# Patient Record
Sex: Female | Born: 1980 | Race: Black or African American | Hispanic: No | Marital: Single | Smoking: Former smoker
Health system: Southern US, Community
[De-identification: ages and names within clinical notes are randomized; demographics above are authoritative.]

## PROBLEM LIST (undated history)

## (undated) ENCOUNTER — Emergency Department (HOSPITAL_COMMUNITY): Admission: EM | Payer: Medicaid Other | Source: Home / Self Care

## (undated) DIAGNOSIS — G43909 Migraine, unspecified, not intractable, without status migrainosus: Secondary | ICD-10-CM

## (undated) DIAGNOSIS — J40 Bronchitis, not specified as acute or chronic: Secondary | ICD-10-CM

## (undated) DIAGNOSIS — I10 Essential (primary) hypertension: Secondary | ICD-10-CM

## (undated) DIAGNOSIS — F329 Major depressive disorder, single episode, unspecified: Secondary | ICD-10-CM

## (undated) DIAGNOSIS — N760 Acute vaginitis: Secondary | ICD-10-CM

## (undated) DIAGNOSIS — N8110 Cystocele, unspecified: Secondary | ICD-10-CM

## (undated) DIAGNOSIS — E119 Type 2 diabetes mellitus without complications: Secondary | ICD-10-CM

## (undated) DIAGNOSIS — B379 Candidiasis, unspecified: Secondary | ICD-10-CM

## (undated) DIAGNOSIS — N809 Endometriosis, unspecified: Secondary | ICD-10-CM

## (undated) DIAGNOSIS — B9689 Other specified bacterial agents as the cause of diseases classified elsewhere: Secondary | ICD-10-CM

## (undated) DIAGNOSIS — D259 Leiomyoma of uterus, unspecified: Secondary | ICD-10-CM

## (undated) DIAGNOSIS — N83209 Unspecified ovarian cyst, unspecified side: Secondary | ICD-10-CM

## (undated) DIAGNOSIS — R51 Headache: Secondary | ICD-10-CM

## (undated) DIAGNOSIS — F32A Depression, unspecified: Secondary | ICD-10-CM

## (undated) DIAGNOSIS — F419 Anxiety disorder, unspecified: Secondary | ICD-10-CM

## (undated) DIAGNOSIS — M62838 Other muscle spasm: Secondary | ICD-10-CM

## (undated) DIAGNOSIS — M1990 Unspecified osteoarthritis, unspecified site: Secondary | ICD-10-CM

## (undated) DIAGNOSIS — M419 Scoliosis, unspecified: Secondary | ICD-10-CM

## (undated) HISTORY — DX: Cystocele, unspecified: N81.10

## (undated) HISTORY — PX: CHOLECYSTECTOMY: SHX55

## (undated) HISTORY — DX: Migraine, unspecified, not intractable, without status migrainosus: G43.909

## (undated) HISTORY — PX: TUBAL LIGATION: SHX77

## (undated) HISTORY — DX: Candidiasis, unspecified: B37.9

## (undated) HISTORY — DX: Acute vaginitis: B96.89

## (undated) HISTORY — DX: Other specified bacterial agents as the cause of diseases classified elsewhere: N76.0

---
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
## 2000-02-08 ENCOUNTER — Inpatient Hospital Stay (HOSPITAL_COMMUNITY): Admission: AD | Admit: 2000-02-08 | Discharge: 2000-02-08 | Payer: Self-pay | Admitting: Obstetrics

## 2000-06-07 ENCOUNTER — Inpatient Hospital Stay (HOSPITAL_COMMUNITY): Admission: AD | Admit: 2000-06-07 | Discharge: 2000-06-07 | Payer: Self-pay | Admitting: Obstetrics

## 2001-02-25 ENCOUNTER — Inpatient Hospital Stay (HOSPITAL_COMMUNITY): Admission: AD | Admit: 2001-02-25 | Discharge: 2001-02-25 | Payer: Self-pay | Admitting: Obstetrics

## 2001-11-21 ENCOUNTER — Encounter: Admission: RE | Admit: 2001-11-21 | Discharge: 2002-02-19 | Payer: Self-pay | Admitting: Internal Medicine

## 2002-01-08 ENCOUNTER — Inpatient Hospital Stay (HOSPITAL_COMMUNITY): Admission: AD | Admit: 2002-01-08 | Discharge: 2002-01-08 | Payer: Self-pay | Admitting: Obstetrics and Gynecology

## 2002-02-16 ENCOUNTER — Encounter: Payer: Self-pay | Admitting: Family Medicine

## 2002-02-16 ENCOUNTER — Inpatient Hospital Stay (HOSPITAL_COMMUNITY): Admission: AD | Admit: 2002-02-16 | Discharge: 2002-02-16 | Payer: Self-pay | Admitting: *Deleted

## 2002-02-25 ENCOUNTER — Inpatient Hospital Stay (HOSPITAL_COMMUNITY): Admission: RE | Admit: 2002-02-25 | Discharge: 2002-02-25 | Payer: Self-pay | Admitting: *Deleted

## 2002-06-10 ENCOUNTER — Inpatient Hospital Stay (HOSPITAL_COMMUNITY): Admission: AD | Admit: 2002-06-10 | Discharge: 2002-06-10 | Payer: Self-pay | Admitting: Obstetrics

## 2002-07-20 ENCOUNTER — Inpatient Hospital Stay (HOSPITAL_COMMUNITY): Admission: AD | Admit: 2002-07-20 | Discharge: 2002-07-20 | Payer: Self-pay | Admitting: Obstetrics

## 2002-08-11 ENCOUNTER — Inpatient Hospital Stay (HOSPITAL_COMMUNITY): Admission: AD | Admit: 2002-08-11 | Discharge: 2002-08-11 | Payer: Self-pay | Admitting: Obstetrics

## 2002-10-15 ENCOUNTER — Inpatient Hospital Stay (HOSPITAL_COMMUNITY): Admission: AD | Admit: 2002-10-15 | Discharge: 2002-10-18 | Payer: Self-pay | Admitting: Obstetrics

## 2002-10-15 ENCOUNTER — Ambulatory Visit (HOSPITAL_COMMUNITY): Admission: RE | Admit: 2002-10-15 | Discharge: 2002-10-15 | Payer: Self-pay | Admitting: Obstetrics

## 2002-10-15 ENCOUNTER — Encounter: Payer: Self-pay | Admitting: Obstetrics

## 2003-04-27 ENCOUNTER — Emergency Department (HOSPITAL_COMMUNITY): Admission: EM | Admit: 2003-04-27 | Discharge: 2003-04-27 | Payer: Self-pay | Admitting: *Deleted

## 2003-04-28 ENCOUNTER — Inpatient Hospital Stay (HOSPITAL_COMMUNITY): Admission: AD | Admit: 2003-04-28 | Discharge: 2003-04-28 | Payer: Self-pay | Admitting: Obstetrics

## 2003-09-09 ENCOUNTER — Inpatient Hospital Stay (HOSPITAL_COMMUNITY): Admission: AD | Admit: 2003-09-09 | Discharge: 2003-09-09 | Payer: Self-pay | Admitting: Obstetrics

## 2003-11-11 ENCOUNTER — Inpatient Hospital Stay (HOSPITAL_COMMUNITY): Admission: AD | Admit: 2003-11-11 | Discharge: 2003-11-11 | Payer: Self-pay | Admitting: Obstetrics

## 2003-11-15 ENCOUNTER — Inpatient Hospital Stay (HOSPITAL_COMMUNITY): Admission: AD | Admit: 2003-11-15 | Discharge: 2003-11-15 | Payer: Self-pay | Admitting: Gynecology

## 2003-12-16 ENCOUNTER — Inpatient Hospital Stay (HOSPITAL_COMMUNITY): Admission: AD | Admit: 2003-12-16 | Discharge: 2003-12-16 | Payer: Self-pay | Admitting: Obstetrics

## 2003-12-22 ENCOUNTER — Inpatient Hospital Stay (HOSPITAL_COMMUNITY): Admission: AD | Admit: 2003-12-22 | Discharge: 2003-12-22 | Payer: Self-pay | Admitting: Obstetrics

## 2003-12-27 ENCOUNTER — Inpatient Hospital Stay (HOSPITAL_COMMUNITY): Admission: AD | Admit: 2003-12-27 | Discharge: 2003-12-29 | Payer: Self-pay | Admitting: Obstetrics

## 2003-12-28 ENCOUNTER — Encounter (INDEPENDENT_AMBULATORY_CARE_PROVIDER_SITE_OTHER): Payer: Self-pay | Admitting: *Deleted

## 2004-03-07 ENCOUNTER — Inpatient Hospital Stay (HOSPITAL_COMMUNITY): Admission: AD | Admit: 2004-03-07 | Discharge: 2004-03-07 | Payer: Self-pay | Admitting: Obstetrics

## 2004-03-12 ENCOUNTER — Inpatient Hospital Stay (HOSPITAL_COMMUNITY): Admission: AD | Admit: 2004-03-12 | Discharge: 2004-03-12 | Payer: Self-pay | Admitting: Obstetrics

## 2004-06-24 ENCOUNTER — Inpatient Hospital Stay (HOSPITAL_COMMUNITY): Admission: AD | Admit: 2004-06-24 | Discharge: 2004-06-24 | Payer: Self-pay | Admitting: Obstetrics

## 2004-10-19 ENCOUNTER — Emergency Department (HOSPITAL_COMMUNITY): Admission: EM | Admit: 2004-10-19 | Discharge: 2004-10-19 | Payer: Self-pay | Admitting: Emergency Medicine

## 2004-11-09 ENCOUNTER — Inpatient Hospital Stay (HOSPITAL_COMMUNITY): Admission: AD | Admit: 2004-11-09 | Discharge: 2004-11-09 | Payer: Self-pay | Admitting: Obstetrics & Gynecology

## 2004-11-19 ENCOUNTER — Emergency Department (HOSPITAL_COMMUNITY): Admission: EM | Admit: 2004-11-19 | Discharge: 2004-11-19 | Payer: Self-pay | Admitting: Emergency Medicine

## 2005-01-22 ENCOUNTER — Inpatient Hospital Stay (HOSPITAL_COMMUNITY): Admission: AD | Admit: 2005-01-22 | Discharge: 2005-01-22 | Payer: Self-pay | Admitting: Family Medicine

## 2005-02-03 ENCOUNTER — Emergency Department (HOSPITAL_COMMUNITY): Admission: EM | Admit: 2005-02-03 | Discharge: 2005-02-03 | Payer: Self-pay | Admitting: Emergency Medicine

## 2005-02-15 ENCOUNTER — Emergency Department (HOSPITAL_COMMUNITY): Admission: EM | Admit: 2005-02-15 | Discharge: 2005-02-16 | Payer: Self-pay | Admitting: Emergency Medicine

## 2005-03-13 ENCOUNTER — Inpatient Hospital Stay (HOSPITAL_COMMUNITY): Admission: AD | Admit: 2005-03-13 | Discharge: 2005-03-13 | Payer: Self-pay | Admitting: *Deleted

## 2005-08-30 ENCOUNTER — Inpatient Hospital Stay (HOSPITAL_COMMUNITY): Admission: AD | Admit: 2005-08-30 | Discharge: 2005-08-30 | Payer: Self-pay | Admitting: Obstetrics & Gynecology

## 2005-09-19 ENCOUNTER — Inpatient Hospital Stay (HOSPITAL_COMMUNITY): Admission: AD | Admit: 2005-09-19 | Discharge: 2005-09-19 | Payer: Self-pay | Admitting: Gynecology

## 2006-02-04 ENCOUNTER — Emergency Department (HOSPITAL_COMMUNITY): Admission: EM | Admit: 2006-02-04 | Discharge: 2006-02-04 | Payer: Self-pay | Admitting: Emergency Medicine

## 2006-02-21 ENCOUNTER — Inpatient Hospital Stay (HOSPITAL_COMMUNITY): Admission: AD | Admit: 2006-02-21 | Discharge: 2006-02-21 | Payer: Self-pay | Admitting: Gynecology

## 2006-02-25 ENCOUNTER — Emergency Department (HOSPITAL_COMMUNITY): Admission: EM | Admit: 2006-02-25 | Discharge: 2006-02-25 | Payer: Self-pay | Admitting: Emergency Medicine

## 2006-03-04 ENCOUNTER — Inpatient Hospital Stay (HOSPITAL_COMMUNITY): Admission: AD | Admit: 2006-03-04 | Discharge: 2006-03-04 | Payer: Self-pay | Admitting: Obstetrics & Gynecology

## 2006-04-12 ENCOUNTER — Emergency Department (HOSPITAL_COMMUNITY): Admission: EM | Admit: 2006-04-12 | Discharge: 2006-04-12 | Payer: Self-pay | Admitting: Emergency Medicine

## 2006-06-20 ENCOUNTER — Emergency Department (HOSPITAL_COMMUNITY): Admission: EM | Admit: 2006-06-20 | Discharge: 2006-06-20 | Payer: Self-pay | Admitting: Emergency Medicine

## 2006-06-29 ENCOUNTER — Emergency Department (HOSPITAL_COMMUNITY): Admission: EM | Admit: 2006-06-29 | Discharge: 2006-06-29 | Payer: Self-pay | Admitting: Emergency Medicine

## 2006-08-30 ENCOUNTER — Emergency Department (HOSPITAL_COMMUNITY): Admission: EM | Admit: 2006-08-30 | Discharge: 2006-08-30 | Payer: Self-pay | Admitting: Emergency Medicine

## 2007-03-07 ENCOUNTER — Inpatient Hospital Stay (HOSPITAL_COMMUNITY): Admission: RE | Admit: 2007-03-07 | Discharge: 2007-03-07 | Payer: Self-pay | Admitting: Obstetrics & Gynecology

## 2007-09-19 ENCOUNTER — Emergency Department (HOSPITAL_COMMUNITY): Admission: EM | Admit: 2007-09-19 | Discharge: 2007-09-19 | Payer: Self-pay | Admitting: Emergency Medicine

## 2008-01-31 ENCOUNTER — Emergency Department (HOSPITAL_COMMUNITY): Admission: EM | Admit: 2008-01-31 | Discharge: 2008-01-31 | Payer: Self-pay | Admitting: Emergency Medicine

## 2008-03-24 ENCOUNTER — Inpatient Hospital Stay (HOSPITAL_COMMUNITY): Admission: AD | Admit: 2008-03-24 | Discharge: 2008-03-24 | Payer: Self-pay | Admitting: Obstetrics & Gynecology

## 2008-06-22 ENCOUNTER — Emergency Department (HOSPITAL_COMMUNITY): Admission: EM | Admit: 2008-06-22 | Discharge: 2008-06-22 | Payer: Self-pay | Admitting: Emergency Medicine

## 2008-08-19 ENCOUNTER — Inpatient Hospital Stay (HOSPITAL_COMMUNITY): Admission: AD | Admit: 2008-08-19 | Discharge: 2008-08-19 | Payer: Self-pay | Admitting: Obstetrics & Gynecology

## 2008-10-08 ENCOUNTER — Emergency Department (HOSPITAL_COMMUNITY): Admission: EM | Admit: 2008-10-08 | Discharge: 2008-10-08 | Payer: Self-pay | Admitting: Emergency Medicine

## 2008-11-09 ENCOUNTER — Inpatient Hospital Stay (HOSPITAL_COMMUNITY): Admission: AD | Admit: 2008-11-09 | Discharge: 2008-11-09 | Payer: Self-pay | Admitting: Obstetrics & Gynecology

## 2009-01-27 ENCOUNTER — Emergency Department (HOSPITAL_COMMUNITY): Admission: EM | Admit: 2009-01-27 | Discharge: 2009-01-27 | Payer: Self-pay | Admitting: Emergency Medicine

## 2009-01-29 ENCOUNTER — Emergency Department (HOSPITAL_COMMUNITY): Admission: EM | Admit: 2009-01-29 | Discharge: 2009-01-29 | Payer: Self-pay | Admitting: Emergency Medicine

## 2009-01-30 ENCOUNTER — Inpatient Hospital Stay (HOSPITAL_COMMUNITY): Admission: EM | Admit: 2009-01-30 | Discharge: 2009-02-02 | Payer: Self-pay | Admitting: Emergency Medicine

## 2009-01-30 ENCOUNTER — Ambulatory Visit: Payer: Self-pay | Admitting: Internal Medicine

## 2009-01-30 ENCOUNTER — Encounter: Payer: Self-pay | Admitting: Obstetrics and Gynecology

## 2009-01-31 ENCOUNTER — Encounter (INDEPENDENT_AMBULATORY_CARE_PROVIDER_SITE_OTHER): Payer: Self-pay | Admitting: Surgery

## 2009-02-05 ENCOUNTER — Emergency Department (HOSPITAL_COMMUNITY): Admission: EM | Admit: 2009-02-05 | Discharge: 2009-02-05 | Payer: Self-pay | Admitting: Family Medicine

## 2009-04-20 ENCOUNTER — Emergency Department (HOSPITAL_COMMUNITY): Admission: EM | Admit: 2009-04-20 | Discharge: 2009-04-20 | Payer: Self-pay | Admitting: Family Medicine

## 2009-04-21 ENCOUNTER — Emergency Department (HOSPITAL_COMMUNITY): Admission: EM | Admit: 2009-04-21 | Discharge: 2009-04-21 | Payer: Self-pay | Admitting: Emergency Medicine

## 2009-05-12 ENCOUNTER — Emergency Department (HOSPITAL_COMMUNITY): Admission: EM | Admit: 2009-05-12 | Discharge: 2009-05-12 | Payer: Self-pay | Admitting: Emergency Medicine

## 2009-06-01 ENCOUNTER — Emergency Department (HOSPITAL_COMMUNITY): Admission: EM | Admit: 2009-06-01 | Discharge: 2009-06-01 | Payer: Self-pay | Admitting: Emergency Medicine

## 2009-07-20 ENCOUNTER — Inpatient Hospital Stay (HOSPITAL_COMMUNITY): Admission: AD | Admit: 2009-07-20 | Discharge: 2009-07-20 | Payer: Self-pay | Admitting: Family Medicine

## 2009-08-20 ENCOUNTER — Emergency Department (HOSPITAL_COMMUNITY): Admission: EM | Admit: 2009-08-20 | Discharge: 2009-08-21 | Payer: Self-pay | Admitting: Emergency Medicine

## 2009-08-22 ENCOUNTER — Emergency Department (HOSPITAL_COMMUNITY): Admission: EM | Admit: 2009-08-22 | Discharge: 2009-08-23 | Payer: Self-pay | Admitting: Emergency Medicine

## 2009-09-09 ENCOUNTER — Emergency Department (HOSPITAL_COMMUNITY): Admission: EM | Admit: 2009-09-09 | Discharge: 2009-09-09 | Payer: Self-pay | Admitting: Family Medicine

## 2009-10-02 ENCOUNTER — Emergency Department (HOSPITAL_COMMUNITY): Admission: EM | Admit: 2009-10-02 | Discharge: 2009-10-02 | Payer: Self-pay | Admitting: Emergency Medicine

## 2009-10-05 ENCOUNTER — Ambulatory Visit: Payer: Self-pay | Admitting: Advanced Practice Midwife

## 2009-10-05 ENCOUNTER — Inpatient Hospital Stay (HOSPITAL_COMMUNITY): Admission: AD | Admit: 2009-10-05 | Discharge: 2009-10-05 | Payer: Self-pay | Admitting: Obstetrics & Gynecology

## 2009-10-29 ENCOUNTER — Emergency Department (HOSPITAL_COMMUNITY): Admission: EM | Admit: 2009-10-29 | Discharge: 2009-10-30 | Payer: Self-pay | Admitting: Emergency Medicine

## 2010-01-04 ENCOUNTER — Inpatient Hospital Stay (HOSPITAL_COMMUNITY): Admission: AD | Admit: 2010-01-04 | Discharge: 2010-01-04 | Payer: Self-pay | Admitting: Obstetrics & Gynecology

## 2010-03-04 ENCOUNTER — Inpatient Hospital Stay (HOSPITAL_COMMUNITY): Admission: AD | Admit: 2010-03-04 | Discharge: 2010-03-04 | Payer: Self-pay | Admitting: Obstetrics and Gynecology

## 2010-03-20 ENCOUNTER — Emergency Department (HOSPITAL_COMMUNITY): Admission: EM | Admit: 2010-03-20 | Discharge: 2010-03-20 | Payer: Self-pay | Admitting: Emergency Medicine

## 2010-05-01 ENCOUNTER — Emergency Department (HOSPITAL_COMMUNITY)
Admission: EM | Admit: 2010-05-01 | Discharge: 2010-05-01 | Payer: Self-pay | Source: Home / Self Care | Admitting: Emergency Medicine

## 2010-07-04 LAB — URINALYSIS, ROUTINE W REFLEX MICROSCOPIC
Glucose, UA: NEGATIVE mg/dL
Glucose, UA: NEGATIVE mg/dL
Ketones, ur: NEGATIVE mg/dL
Ketones, ur: NEGATIVE mg/dL
Leukocytes, UA: NEGATIVE
Nitrite: NEGATIVE
Nitrite: NEGATIVE
Protein, ur: NEGATIVE mg/dL
Protein, ur: NEGATIVE mg/dL
Urobilinogen, UA: 0.2 mg/dL (ref 0.0–1.0)
Urobilinogen, UA: 0.2 mg/dL (ref 0.0–1.0)

## 2010-07-04 LAB — POCT I-STAT, CHEM 8
HCT: 41 % (ref 36.0–46.0)
Hemoglobin: 13.9 g/dL (ref 12.0–15.0)
Potassium: 4.1 mEq/L (ref 3.5–5.1)
Sodium: 140 mEq/L (ref 135–145)
TCO2: 25 mmol/L (ref 0–100)

## 2010-07-04 LAB — CBC
HCT: 35.3 % — ABNORMAL LOW (ref 36.0–46.0)
HCT: 37.3 % (ref 36.0–46.0)
Hemoglobin: 11.9 g/dL — ABNORMAL LOW (ref 12.0–15.0)
MCH: 31 pg (ref 26.0–34.0)
MCH: 30.9 pg (ref 26.0–34.0)
MCHC: 33.7 g/dL (ref 30.0–36.0)
MCHC: 34 g/dL (ref 30.0–36.0)
MCV: 92 fL (ref 78.0–100.0)
MCV: 90.9 fL (ref 78.0–100.0)
Platelets: 410 10*3/uL — ABNORMAL HIGH (ref 150–400)
RBC: 3.83 MIL/uL — ABNORMAL LOW (ref 3.87–5.11)
RDW: 13.6 % (ref 11.5–15.5)

## 2010-07-04 LAB — POCT PREGNANCY, URINE: Preg Test, Ur: NEGATIVE

## 2010-07-04 LAB — DIFFERENTIAL
Basophils Absolute: 0.2 10*3/uL — ABNORMAL HIGH (ref 0.0–0.1)
Basophils Relative: 2 % — ABNORMAL HIGH (ref 0–1)
Eosinophils Absolute: 0.2 10*3/uL (ref 0.0–0.7)
Eosinophils Relative: 2 % (ref 0–5)
Neutrophils Relative %: 53 % (ref 43–77)

## 2010-07-04 LAB — URINE MICROSCOPIC-ADD ON

## 2010-07-06 LAB — URINALYSIS, ROUTINE W REFLEX MICROSCOPIC
Bilirubin Urine: NEGATIVE
Ketones, ur: NEGATIVE mg/dL
Nitrite: NEGATIVE
Protein, ur: NEGATIVE mg/dL
pH: 7.5 (ref 5.0–8.0)

## 2010-07-06 LAB — WET PREP, GENITAL

## 2010-07-06 LAB — HERPES SIMPLEX VIRUS CULTURE: Culture: DETECTED

## 2010-07-06 LAB — GC/CHLAMYDIA PROBE AMP, GENITAL: Chlamydia, DNA Probe: NEGATIVE

## 2010-07-09 LAB — URINALYSIS, ROUTINE W REFLEX MICROSCOPIC
Bilirubin Urine: NEGATIVE
Bilirubin Urine: NEGATIVE
Bilirubin Urine: NEGATIVE
Hgb urine dipstick: NEGATIVE
Ketones, ur: NEGATIVE mg/dL
Nitrite: NEGATIVE
Nitrite: NEGATIVE
Protein, ur: NEGATIVE mg/dL
Specific Gravity, Urine: 1.016 (ref 1.005–1.030)
Specific Gravity, Urine: 1.015 (ref 1.005–1.030)
Urobilinogen, UA: 0.2 mg/dL (ref 0.0–1.0)
Urobilinogen, UA: 0.2 mg/dL (ref 0.0–1.0)
Urobilinogen, UA: 0.2 mg/dL (ref 0.0–1.0)
pH: 8.5 — ABNORMAL HIGH (ref 5.0–8.0)

## 2010-07-09 LAB — POCT I-STAT, CHEM 8
Calcium, Ion: 1.17 mmol/L (ref 1.12–1.32)
Chloride: 106 mEq/L (ref 96–112)
Creatinine, Ser: 0.8 mg/dL (ref 0.4–1.2)
Glucose, Bld: 87 mg/dL (ref 70–99)
Glucose, Bld: 100 mg/dL — ABNORMAL HIGH (ref 70–99)
HCT: 38 % (ref 36.0–46.0)
HCT: 39 % (ref 36.0–46.0)
Hemoglobin: 12.9 g/dL (ref 12.0–15.0)
Potassium: 3.4 mEq/L — ABNORMAL LOW (ref 3.5–5.1)

## 2010-07-09 LAB — WET PREP, GENITAL

## 2010-07-09 LAB — GC/CHLAMYDIA PROBE AMP, GENITAL: GC Probe Amp, Genital: NEGATIVE

## 2010-07-09 LAB — URINE MICROSCOPIC-ADD ON

## 2010-07-09 LAB — CBC
Hemoglobin: 13.1 g/dL (ref 12.0–15.0)
MCHC: 33.6 g/dL (ref 30.0–36.0)
RBC: 4.21 MIL/uL (ref 3.87–5.11)
WBC: 9.3 10*3/uL (ref 4.0–10.5)

## 2010-07-09 LAB — POCT PREGNANCY, URINE: Preg Test, Ur: NEGATIVE

## 2010-07-09 LAB — RPR: RPR Ser Ql: NONREACTIVE

## 2010-07-10 LAB — POCT URINALYSIS DIP (DEVICE)
Glucose, UA: NEGATIVE mg/dL
Hgb urine dipstick: NEGATIVE
Specific Gravity, Urine: 1.02 (ref 1.005–1.030)
Urobilinogen, UA: 1 mg/dL (ref 0.0–1.0)
pH: 7 (ref 5.0–8.0)

## 2010-07-10 LAB — URINALYSIS, ROUTINE W REFLEX MICROSCOPIC
Glucose, UA: NEGATIVE mg/dL
Hgb urine dipstick: NEGATIVE
Specific Gravity, Urine: 1.026 (ref 1.005–1.030)
pH: 6.5 (ref 5.0–8.0)

## 2010-07-12 LAB — URINALYSIS, ROUTINE W REFLEX MICROSCOPIC
Glucose, UA: NEGATIVE mg/dL
Hgb urine dipstick: NEGATIVE
Ketones, ur: NEGATIVE mg/dL
pH: 6.5 (ref 5.0–8.0)

## 2010-07-12 LAB — POCT PREGNANCY, URINE: Preg Test, Ur: NEGATIVE

## 2010-07-14 LAB — URINE MICROSCOPIC-ADD ON

## 2010-07-14 LAB — GC/CHLAMYDIA PROBE AMP, GENITAL
Chlamydia, DNA Probe: NEGATIVE
GC Probe Amp, Genital: POSITIVE — AB

## 2010-07-14 LAB — URINALYSIS, ROUTINE W REFLEX MICROSCOPIC
Nitrite: NEGATIVE
Specific Gravity, Urine: >1.03 — ABNORMAL HIGH (ref 1.005–1.030)
Urobilinogen, UA: 0.2 mg/dL (ref 0.0–1.0)
pH: 6 (ref 5.0–8.0)

## 2010-07-25 ENCOUNTER — Emergency Department (HOSPITAL_COMMUNITY)
Admission: EM | Admit: 2010-07-25 | Discharge: 2010-07-25 | Disposition: A | Discharge status: Home / Self Care | Payer: Self-pay | Attending: Emergency Medicine | Admitting: Emergency Medicine

## 2010-07-25 DIAGNOSIS — J45909 Unspecified asthma, uncomplicated: Secondary | ICD-10-CM | POA: Insufficient documentation

## 2010-07-25 DIAGNOSIS — R51 Headache: Secondary | ICD-10-CM | POA: Insufficient documentation

## 2010-07-25 DIAGNOSIS — R0602 Shortness of breath: Secondary | ICD-10-CM | POA: Insufficient documentation

## 2010-07-27 LAB — CBC
HCT: 38.6 % (ref 36.0–46.0)
HCT: 35.7 % — ABNORMAL LOW (ref 36.0–46.0)
Hemoglobin: 12.7 g/dL (ref 12.0–15.0)
Hemoglobin: 11.8 g/dL — ABNORMAL LOW (ref 12.0–15.0)
MCHC: 32.8 g/dL (ref 30.0–36.0)
RBC: 3.82 MIL/uL — ABNORMAL LOW (ref 3.87–5.11)
RBC: 3.99 MIL/uL (ref 3.87–5.11)
RDW: 13 % (ref 11.5–15.5)
RDW: 12.8 % (ref 11.5–15.5)
WBC: 15.5 10*3/uL — ABNORMAL HIGH (ref 4.0–10.5)

## 2010-07-27 LAB — COMPREHENSIVE METABOLIC PANEL
ALT: 320 U/L — ABNORMAL HIGH (ref 0–35)
ALT: 380 U/L — ABNORMAL HIGH (ref 0–35)
AST: 100 U/L — ABNORMAL HIGH (ref 0–37)
Albumin: 3.1 g/dL — ABNORMAL LOW (ref 3.5–5.2)
Alkaline Phosphatase: 108 U/L (ref 39–117)
Alkaline Phosphatase: 76 U/L (ref 39–117)
BUN: 4 mg/dL — ABNORMAL LOW (ref 6–23)
BUN: 3 mg/dL — ABNORMAL LOW (ref 6–23)
CO2: 24 mEq/L (ref 19–32)
CO2: 26 mEq/L (ref 19–32)
Chloride: 110 mEq/L (ref 96–112)
GFR calc Af Amer: >60 mL/min (ref >60)
GFR calc non Af Amer: >60 mL/min (ref >60)
GFR calc non Af Amer: >60 mL/min (ref >60)
Glucose, Bld: 96 mg/dL (ref 70–99)
Glucose, Bld: 97 mg/dL (ref 70–99)
Potassium: 3.5 mEq/L (ref 3.5–5.1)
Potassium: 3.6 mEq/L (ref 3.5–5.1)
Sodium: 139 mEq/L (ref 135–145)
Sodium: 140 mEq/L (ref 135–145)
Total Bilirubin: 0.6 mg/dL (ref 0.3–1.2)
Total Bilirubin: 0.8 mg/dL (ref 0.3–1.2)
Total Protein: 7.4 g/dL (ref 6.0–8.3)
Total Protein: 6.3 g/dL (ref 6.0–8.3)

## 2010-07-27 LAB — WET PREP, GENITAL

## 2010-07-27 LAB — DIFFERENTIAL
Basophils Absolute: 0.1 10*3/uL (ref 0.0–0.1)
Eosinophils Absolute: 0 10*3/uL (ref 0.0–0.7)
Eosinophils Relative: 0 % (ref 0–5)
Lymphs Abs: 2.5 10*3/uL (ref 0.7–4.0)
Monocytes Absolute: 0.7 10*3/uL (ref 0.1–1.0)

## 2010-07-27 LAB — URINALYSIS, ROUTINE W REFLEX MICROSCOPIC
Bilirubin Urine: NEGATIVE
Ketones, ur: NEGATIVE mg/dL
Nitrite: NEGATIVE
Nitrite: NEGATIVE
Protein, ur: NEGATIVE mg/dL
Specific Gravity, Urine: 1.02 (ref 1.005–1.030)
Specific Gravity, Urine: 1.023 (ref 1.005–1.030)
Urobilinogen, UA: 4 mg/dL — ABNORMAL HIGH (ref 0.0–1.0)
Urobilinogen, UA: 0.2 mg/dL (ref 0.0–1.0)

## 2010-07-27 LAB — AMYLASE: Amylase: 82 U/L (ref 27–131)

## 2010-07-27 LAB — GC/CHLAMYDIA PROBE AMP, GENITAL
Chlamydia, DNA Probe: NEGATIVE
GC Probe Amp, Genital: NEGATIVE

## 2010-07-27 LAB — POCT PREGNANCY, URINE
Preg Test, Ur: NEGATIVE
Preg Test, Ur: NEGATIVE

## 2010-07-27 LAB — D-DIMER, QUANTITATIVE: D-Dimer, Quant: 0.37 ug/mL-FEU (ref 0.00–0.48)

## 2010-07-27 LAB — LIPASE, BLOOD
Lipase: 21 U/L (ref 11–59)
Lipase: 22 U/L (ref 11–59)

## 2010-07-30 LAB — URINALYSIS, ROUTINE W REFLEX MICROSCOPIC
Ketones, ur: NEGATIVE mg/dL
Leukocytes, UA: NEGATIVE
Nitrite: NEGATIVE
Protein, ur: NEGATIVE mg/dL
Urobilinogen, UA: 0.2 mg/dL (ref 0.0–1.0)

## 2010-07-30 LAB — WET PREP, GENITAL

## 2010-07-30 LAB — URINE MICROSCOPIC-ADD ON

## 2010-07-30 LAB — GC/CHLAMYDIA PROBE AMP, GENITAL: GC Probe Amp, Genital: NEGATIVE

## 2010-07-30 LAB — POCT PREGNANCY, URINE: Preg Test, Ur: NEGATIVE

## 2010-08-02 LAB — URINALYSIS, ROUTINE W REFLEX MICROSCOPIC
Glucose, UA: NEGATIVE mg/dL
Ketones, ur: NEGATIVE mg/dL
Protein, ur: NEGATIVE mg/dL
Urobilinogen, UA: 0.2 mg/dL (ref 0.0–1.0)

## 2010-08-02 LAB — WET PREP, GENITAL: Trich, Wet Prep: NONE SEEN

## 2010-08-02 LAB — GC/CHLAMYDIA PROBE AMP, GENITAL: GC Probe Amp, Genital: NEGATIVE

## 2010-09-08 NOTE — Op Note (Signed)
NAME:  Dana Hughes, Dana Hughes                        ACCOUNT NO.:  [id number]   MEDICAL RECORD NO.:  [id number]                   PATIENT TYPE:  INP   LOCATION:  9127                                 FACILITY:  [facility]   PHYSICIAN:  Kathreen Cosier, M.D.           DATE OF BIRTH:  12/31/1980   DATE OF PROCEDURE:  12/28/2003  DATE OF DISCHARGE:                                 OPERATIVE REPORT   PREOPERATIVE DIAGNOSIS:  Multiparity.   POSTOPERATIVE DIAGNOSIS:  Multiparity.   PROCEDURE:  Postpartum tubal ligation.   DESCRIPTION OF PROCEDURE:  Under general anesthesia with the patient in the  supine position, the abdomen prepped and draped, the bladder emptied with a  straight catheter.  A midline subumbilical incision 1 inch long was made,  carried down to the fascia.  The fascia was cleaned, grasped with two  Kocher's and the fascia and the peritoneum were opened with the Mayo  scissors.  The left tube was grasped in the mid portion with the Babcock  clamp and 0 plain suture placed in the mesosalpinx below the portion of the  tube was then clamped.  This was tied and approximately 1 inch of tube  transected.  The tube had been traced to the fimbria.  The procedure was  done in the exact fashion on the other side.  The lap and sponge counts were  correct.  The abdomen closed in layers.  The peritoneum and the fascia with  continuously closed with 2-0 Dexon.  The skin was closed with subcuticular  stitch of 4-0 Monocryl.  Blood loss less than 5 cc.  The patient tolerated  the procedure well, returned to the recovery room in good condition.                                               Kathreen Cosier, M.D.    BAM/MEDQ  D:  12/28/2003  T:  12/28/2003  Job:  [id number]

## 2010-09-08 NOTE — Discharge Summary (Signed)
NAME:  Dana Hughes, Dana Hughes                        ACCOUNT NO.:  [id number]   MEDICAL RECORD NO.:  [id number]                   PATIENT TYPE:  INP   LOCATION:  9127                                 FACILITY:   PHYSICIAN:  Kathreen Cosier, M.D.           DATE OF BIRTH:  15-May-1980   DATE OF ADMISSION:  12/27/2003  DATE OF DISCHARGE:  12/29/2003                                 DISCHARGE SUMMARY   This is a 30 year old, gravida 3, para 1-1-0-2 with an EDC of 12/31/2003 who  was admitted 9 cm dilated, vertex -1 in labor and with ruptured membranes.  She progressed rapidly and had a normal vaginal delivery of a female,  Apgar's of 9 and 9.  The patient desired sterilization and on 12/28/2003  underwent postpartum bilateral tubal ligation.  By 12/29/2003, postpartum  day #2, her hemoglobin was 11.8.  She had no complaints and she was  discharged home ambulatory, on a regular diet, on Darvocet one p.o. q.4h.  p.r.n.   DISCHARGE DIAGNOSIS:  Normal vaginal delivery at term and postpartum  bilateral tubal ligation.                                               Kathreen Cosier, M.D.    BAM/MEDQ  D:  12/29/2003  T:  12/29/2003  Job:  [id number]

## 2011-01-17 LAB — POCT I-STAT, CHEM 8
HCT: 40
Hemoglobin: 13.6
Potassium: 3.8
Sodium: 142
TCO2: 25

## 2011-01-17 LAB — PREGNANCY, URINE: Preg Test, Ur: NEGATIVE

## 2011-01-17 LAB — URINALYSIS, ROUTINE W REFLEX MICROSCOPIC
Ketones, ur: NEGATIVE
Nitrite: NEGATIVE
Protein, ur: NEGATIVE
pH: 7.5

## 2011-01-17 LAB — WET PREP, GENITAL

## 2011-01-17 LAB — URINE MICROSCOPIC-ADD ON

## 2011-01-17 LAB — GC/CHLAMYDIA PROBE AMP, GENITAL: Chlamydia, DNA Probe: NEGATIVE

## 2011-01-18 ENCOUNTER — Inpatient Hospital Stay (HOSPITAL_COMMUNITY)
Admission: AD | Admit: 2011-01-18 | Discharge: 2011-01-18 | Disposition: A | Discharge status: Home / Self Care | Payer: Medicaid Other | Source: Ambulatory Visit | Attending: Obstetrics and Gynecology | Admitting: Obstetrics and Gynecology

## 2011-01-18 ENCOUNTER — Encounter (HOSPITAL_COMMUNITY): Payer: Self-pay

## 2011-01-18 DIAGNOSIS — B373 Candidiasis of vulva and vagina: Secondary | ICD-10-CM | POA: Insufficient documentation

## 2011-01-18 DIAGNOSIS — N760 Acute vaginitis: Secondary | ICD-10-CM | POA: Insufficient documentation

## 2011-01-18 DIAGNOSIS — A499 Bacterial infection, unspecified: Secondary | ICD-10-CM

## 2011-01-18 DIAGNOSIS — B3731 Acute candidiasis of vulva and vagina: Secondary | ICD-10-CM | POA: Insufficient documentation

## 2011-01-18 DIAGNOSIS — B9689 Other specified bacterial agents as the cause of diseases classified elsewhere: Secondary | ICD-10-CM | POA: Insufficient documentation

## 2011-01-18 HISTORY — DX: Depression, unspecified: F32.A

## 2011-01-18 HISTORY — DX: Essential (primary) hypertension: I10

## 2011-01-18 HISTORY — DX: Anxiety disorder, unspecified: F41.9

## 2011-01-18 HISTORY — DX: Major depressive disorder, single episode, unspecified: F32.9

## 2011-01-18 LAB — URINALYSIS, ROUTINE W REFLEX MICROSCOPIC
Leukocytes, UA: NEGATIVE
Nitrite: NEGATIVE
Protein, ur: NEGATIVE mg/dL
Urobilinogen, UA: 0.2 mg/dL (ref 0.0–1.0)

## 2011-01-18 LAB — COMPREHENSIVE METABOLIC PANEL
ALT: 14 U/L (ref 0–35)
AST: 13 U/L (ref 0–37)
Alkaline Phosphatase: 44 U/L (ref 39–117)
CO2: 28 mEq/L (ref 19–32)
Calcium: 9.3 mg/dL (ref 8.4–10.5)
Chloride: 106 mEq/L (ref 96–112)
GFR calc Af Amer: >60 mL/min (ref >60)
GFR calc non Af Amer: >60 mL/min (ref >60)
Glucose, Bld: 80 mg/dL (ref 70–99)
Potassium: 4.2 mEq/L (ref 3.5–5.1)
Sodium: 139 mEq/L (ref 135–145)

## 2011-01-18 LAB — CBC
Hemoglobin: 11.7 g/dL — ABNORMAL LOW (ref 12.0–15.0)
MCV: 91.2 fL (ref 78.0–100.0)
Platelets: 345 10*3/uL (ref 150–400)
RBC: 3.87 MIL/uL (ref 3.87–5.11)
WBC: 10.5 10*3/uL (ref 4.0–10.5)

## 2011-01-18 LAB — WET PREP, GENITAL: Trich, Wet Prep: NONE SEEN

## 2011-01-18 LAB — DIFFERENTIAL
Basophils Absolute: 0 10*3/uL (ref 0.0–0.1)
Lymphocytes Relative: 35 % (ref 12–46)
Lymphs Abs: 3.6 10*3/uL (ref 0.7–4.0)
Neutro Abs: 5.8 10*3/uL (ref 1.7–7.7)
Neutrophils Relative %: 56 % (ref 43–77)

## 2011-01-18 LAB — POCT PREGNANCY, URINE: Preg Test, Ur: NEGATIVE

## 2011-01-18 MED ORDER — METRONIDAZOLE 500 MG PO TABS: 500.0000 mg | ORAL_TABLET | Freq: Two times a day (BID) | ORAL | Status: AC

## 2011-01-18 MED ORDER — KETOROLAC TROMETHAMINE 60 MG/2ML IM SOLN
60.0000 mg | Freq: Once | INTRAMUSCULAR | Status: AC
  Administered 2011-01-18: 60 mg via INTRAMUSCULAR
  Filled 2011-01-18: qty 2

## 2011-01-18 MED ORDER — FLUCONAZOLE 150 MG PO TABS
150.0000 mg | ORAL_TABLET | Freq: Once | ORAL | Status: AC
  Administered 2011-01-18: 150 mg via ORAL
  Filled 2011-01-18: qty 1

## 2011-01-18 NOTE — Progress Notes (Signed)
Pt states she has been having abdominal pain off and on since her last child was born 7 years ago. Started getting worse on 9-25. States she has had a headache for 2 months.

## 2011-01-18 NOTE — ED Provider Notes (Signed)
Agree with above note.  Dana Hughes 01/18/2011 2:07 PM

## 2011-01-18 NOTE — ED Provider Notes (Signed)
History     CSN: [id number] Arrival date & time: 01/18/2011 10:28 AM  Chief Complaint  Patient presents with  . Back Pain    (Consider location/radiation/quality/duration/timing/severity/associated sxs/prior treatment) HPI Dana Hughes is a 30 y.o. female who presents to [facility]U for vaginal discharge that started 3 days ago. She had unprotected sex 5 days ago and the discharge started after that . She states she thinks her boyfriend is cheating on her. Has had some pain in lower abdomen on the right side and radiates to the back. Vaginal itching and burning. Also c/o "migrane headache" that started 2 months ago and is off and on. Has taken Excedrin, ibuprofen, tylenol etc. Without relief.  Past Medical History  Diagnosis Date  . Hypertension     no longer takes meds  . Asthma   . Anxiety   . Depression     Past Surgical History  Procedure Date  . Tubal ligation   . Cholecystectomy     No family history on file.  History  Substance Use Topics  . Smoking status: Never Smoker   . Smokeless tobacco: Not on file  . Alcohol Use: Yes    OB History    Grav Para Term Preterm Abortions TAB SAB Ect Mult Living   4 3 3  0 1 0 1 0 0 3      Review of Systems  Constitutional: Positive for appetite change. Negative for fever, chills, diaphoresis and fatigue.  HENT: Positive for congestion, sore throat, sneezing and sinus pressure. Negative for ear pain, facial swelling, neck pain, neck stiffness and dental problem.   Eyes: Positive for photophobia. Negative for pain and discharge.  Respiratory: Positive for wheezing. Negative for cough and chest tightness.   Cardiovascular: Negative.   Gastrointestinal: Positive for nausea, abdominal pain and diarrhea. Negative for vomiting, constipation and abdominal distention.  Genitourinary: Positive for frequency. Negative for dysuria, flank pain and difficulty urinating.  Musculoskeletal: Positive for back pain. Negative for myalgias and gait  problem.  Skin: Negative for color change and rash.  Neurological: Positive for light-headedness and headaches. Negative for dizziness, speech difficulty, weakness and numbness.  Psychiatric/Behavioral: Negative for confusion.    Allergies  Penicillins and Percocet  Home Medications  No current outpatient prescriptions on file.  BP 115/78  Pulse 66  Temp(Src) 98.1 F (36.7 C) (Oral)  Resp 16  Ht 5' 3.5" (1.613 m)  Wt 205 lb (92.987 kg)  BMI 35.74 kg/m2  LMP 12/21/2010  Physical Exam  Nursing note and vitals reviewed. Constitutional: She is oriented to person, place, and time. She appears well-developed and well-nourished.  HENT:  Head: Normocephalic.  Eyes: EOM are normal.  Neck: Neck supple.  Pulmonary/Chest: Effort normal.  Abdominal: Soft. There is no tenderness.       Unable to reproduce the pain the patient states she has off and on. No inguinal or abdominal hernia palpated.  Genitourinary:       No CVA tenderness. Vaginal irritation, yellow, frothy, malodorous discharge. No CMT or adnexal tenderness. Uterus without palpable enlargement.  Musculoskeletal: Normal range of motion.  Neurological: She is alert and oriented to person, place, and time.  Skin: Skin is warm and dry.   Results for orders placed during the hospital encounter of 01/18/11 (from the past 24 hour(s))  URINALYSIS, ROUTINE W REFLEX MICROSCOPIC     Status: Normal   Collection Time   01/18/11 10:57 AM      Component Value Range   Color, Urine YELLOW  YELLOW    Appearance CLEAR  CLEAR    Specific Gravity, Urine 1.010  1.005 - 1.030    pH 8.0  5.0 - 8.0    Glucose, UA NEGATIVE  NEGATIVE (mg/dL)   Hgb urine dipstick NEGATIVE  NEGATIVE    Bilirubin Urine NEGATIVE  NEGATIVE    Ketones, ur NEGATIVE  NEGATIVE (mg/dL)   Protein, ur NEGATIVE  NEGATIVE (mg/dL)   Urobilinogen, UA 0.2  0.0 - 1.0 (mg/dL)   Nitrite NEGATIVE  NEGATIVE    Leukocytes, UA NEGATIVE  NEGATIVE   POCT PREGNANCY, URINE     Status:  Normal   Collection Time   01/18/11 11:03 AM      Component Value Range   Preg Test, Ur NEGATIVE    CBC     Status: Abnormal   Collection Time   01/18/11 11:56 AM      Component Value Range   WBC 10.5  4.0 - 10.5 (K/uL)   RBC 3.87  3.87 - 5.11 (MIL/uL)   Hemoglobin 11.7 (*) 12.0 - 15.0 (g/dL)   HCT 40.9 (*) 81.1 - 46.0 (%)   MCV 91.2  78.0 - 100.0 (fL)   MCH 30.2  26.0 - 34.0 (pg)   MCHC 33.1  30.0 - 36.0 (g/dL)   RDW 91.4  78.2 - 95.6 (%)   Platelets 345  150 - 400 (K/uL)  DIFFERENTIAL     Status: Normal   Collection Time   01/18/11 11:56 AM      Component Value Range   Neutrophils Relative 56  43 - 77 (%)   Neutro Abs 5.8  1.7 - 7.7 (K/uL)   Lymphocytes Relative 35  12 - 46 (%)   Lymphs Abs 3.6  0.7 - 4.0 (K/uL)   Monocytes Relative 8  3 - 12 (%)   Monocytes Absolute 0.8  0.1 - 1.0 (K/uL)   Eosinophils Relative 2  0 - 5 (%)   Eosinophils Absolute 0.2  0.0 - 0.7 (K/uL)   Basophils Relative 0  0 - 1 (%)   Basophils Absolute 0.0  0.0 - 0.1 (K/uL)  COMPREHENSIVE METABOLIC PANEL     Status: Abnormal   Collection Time   01/18/11 11:56 AM      Component Value Range   Sodium 139  135 - 145 (mEq/L)   Potassium 4.2  3.5 - 5.1 (mEq/L)   Chloride 106  96 - 112 (mEq/L)   CO2 28  19 - 32 (mEq/L)   Glucose, Bld 80  70 - 99 (mg/dL)   BUN 13  6 - 23 (mg/dL)   Creatinine, Ser 2.13  0.50 - 1.10 (mg/dL)   Calcium 9.3  8.4 - 08.6 (mg/dL)   Total Protein 6.8  6.0 - 8.3 (g/dL)   Albumin 3.6  3.5 - 5.2 (g/dL)   AST 13  0 - 37 (U/L)   ALT 14  0 - 35 (U/L)   Alkaline Phosphatase 44  39 - 117 (U/L)   Total Bilirubin 0.2 (*) 0.3 - 1.2 (mg/dL)   GFR calc non Af Amer >60  >60 (mL/min)   GFR calc Af Amer >60  >60 (mL/min)  WET PREP, GENITAL     Status: Abnormal   Collection Time   01/18/11 12:34 PM      Component Value Range   Yeast, Wet Prep MODERATE (*) NONE SEEN    Trich, Wet Prep NONE SEEN  NONE SEEN    Clue Cells, Wet Prep MANY (*) NONE SEEN  WBC, Wet Prep HPF POC FEW (*) NONE SEEN       ED Course  Procedures   MDM   Assessment: Bacterial vaginosis   Monilia vaginitis  Plan:  Flagyl 500 mg po bid x 7 days   Diflucan 150 mg. Po x 1   Follow up with PCP        Kerrie Buffalo, NP 01/18/11 1310

## 2011-01-18 NOTE — Progress Notes (Signed)
Pt states feels like vagina is swollen and has been painful since last time having intercourse without a condom on Sunday.

## 2011-01-23 ENCOUNTER — Emergency Department (HOSPITAL_COMMUNITY)
Admission: EM | Admit: 2011-01-23 | Discharge: 2011-01-23 | Disposition: A | Discharge status: Home / Self Care | Payer: Medicaid Other | Attending: Emergency Medicine | Admitting: Emergency Medicine

## 2011-01-23 DIAGNOSIS — G8929 Other chronic pain: Secondary | ICD-10-CM | POA: Insufficient documentation

## 2011-01-23 DIAGNOSIS — M549 Dorsalgia, unspecified: Secondary | ICD-10-CM | POA: Insufficient documentation

## 2011-01-25 LAB — DIFFERENTIAL
Basophils Absolute: 0 10*3/uL (ref 0.0–0.1)
Basophils Relative: 0 % (ref 0–1)
Lymphocytes Relative: 29 % (ref 12–46)
Neutro Abs: 5 10*3/uL (ref 1.7–7.7)
Neutrophils Relative %: 60 % (ref 43–77)

## 2011-01-25 LAB — URINALYSIS, ROUTINE W REFLEX MICROSCOPIC
Bilirubin Urine: NEGATIVE
Glucose, UA: NEGATIVE mg/dL
Hgb urine dipstick: NEGATIVE
Protein, ur: NEGATIVE mg/dL
Urobilinogen, UA: 0.2 mg/dL (ref 0.0–1.0)

## 2011-01-25 LAB — WET PREP, GENITAL
Trich, Wet Prep: NONE SEEN
Yeast Wet Prep HPF POC: NONE SEEN

## 2011-01-25 LAB — GC/CHLAMYDIA PROBE AMP, GENITAL
Chlamydia, DNA Probe: NEGATIVE
GC Probe Amp, Genital: NEGATIVE

## 2011-01-25 LAB — CBC
MCHC: 33.3 g/dL (ref 30.0–36.0)
RBC: 3.97 MIL/uL (ref 3.87–5.11)
RDW: 13.4 % (ref 11.5–15.5)

## 2011-01-30 LAB — URINALYSIS, ROUTINE W REFLEX MICROSCOPIC
Nitrite: NEGATIVE
Specific Gravity, Urine: 1.02
Urobilinogen, UA: 0.2
pH: 8

## 2011-02-13 ENCOUNTER — Inpatient Hospital Stay (HOSPITAL_COMMUNITY)
Admission: AD | Admit: 2011-02-13 | Discharge: 2011-02-13 | Disposition: A | Discharge status: Home / Self Care | Payer: Medicaid Other | Source: Ambulatory Visit | Attending: Obstetrics & Gynecology | Admitting: Obstetrics & Gynecology

## 2011-02-13 DIAGNOSIS — S90569A Insect bite (nonvenomous), unspecified ankle, initial encounter: Secondary | ICD-10-CM | POA: Insufficient documentation

## 2011-02-13 DIAGNOSIS — W57XXXA Bitten or stung by nonvenomous insect and other nonvenomous arthropods, initial encounter: Secondary | ICD-10-CM | POA: Insufficient documentation

## 2011-02-13 DIAGNOSIS — R300 Dysuria: Secondary | ICD-10-CM | POA: Insufficient documentation

## 2011-02-13 DIAGNOSIS — R109 Unspecified abdominal pain: Secondary | ICD-10-CM | POA: Insufficient documentation

## 2011-02-13 LAB — URINALYSIS, ROUTINE W REFLEX MICROSCOPIC
Hgb urine dipstick: NEGATIVE
Leukocytes, UA: NEGATIVE
Nitrite: NEGATIVE
Protein, ur: NEGATIVE mg/dL
Urobilinogen, UA: 0.2 mg/dL (ref 0.0–1.0)

## 2011-02-13 LAB — POCT PREGNANCY, URINE: Preg Test, Ur: NEGATIVE

## 2011-02-13 NOTE — Progress Notes (Signed)
Called to pt room. Pt states she needs to leave her ride is being discharged from another room in MAU. AMA paper signed, informed pt the risks and benefits of staying. Instructed pt  that she can come back to MAU when time allows. Pt verbalized understanding.

## 2011-02-13 NOTE — Progress Notes (Signed)
Patient states that she has had multiple insect bites on her right leg from the thigh down to the calf over the past few days Start as an itchy bump and progress to sores. Also has been treating with over the counter medication for a yeast infection. Has lower abdominal pain and pain with urination.

## 2011-05-17 ENCOUNTER — Inpatient Hospital Stay (HOSPITAL_COMMUNITY)
Admission: AD | Admit: 2011-05-17 | Discharge: 2011-05-17 | Disposition: A | Discharge status: Home / Self Care | Payer: Medicaid Other | Source: Ambulatory Visit | Attending: Obstetrics & Gynecology | Admitting: Obstetrics & Gynecology

## 2011-05-17 ENCOUNTER — Encounter (HOSPITAL_COMMUNITY): Payer: Self-pay | Admitting: *Deleted

## 2011-05-17 ENCOUNTER — Inpatient Hospital Stay (HOSPITAL_COMMUNITY): Payer: Medicaid Other

## 2011-05-17 DIAGNOSIS — M545 Low back pain, unspecified: Secondary | ICD-10-CM | POA: Insufficient documentation

## 2011-05-17 DIAGNOSIS — N946 Dysmenorrhea, unspecified: Secondary | ICD-10-CM | POA: Insufficient documentation

## 2011-05-17 DIAGNOSIS — R109 Unspecified abdominal pain: Secondary | ICD-10-CM | POA: Insufficient documentation

## 2011-05-17 HISTORY — DX: Headache: R51

## 2011-05-17 LAB — CBC
HCT: 35.2 % — ABNORMAL LOW (ref 36.0–46.0)
Platelets: 320 10*3/uL (ref 150–400)
RDW: 13.2 % (ref 11.5–15.5)
WBC: 10.3 10*3/uL (ref 4.0–10.5)

## 2011-05-17 LAB — DIFFERENTIAL
Basophils Absolute: 0 10*3/uL (ref 0.0–0.1)
Lymphocytes Relative: 29 % (ref 12–46)
Monocytes Absolute: 0.6 10*3/uL (ref 0.1–1.0)
Neutro Abs: 6.5 10*3/uL (ref 1.7–7.7)

## 2011-05-17 LAB — WET PREP, GENITAL: Yeast Wet Prep HPF POC: NONE SEEN

## 2011-05-17 LAB — URINALYSIS, ROUTINE W REFLEX MICROSCOPIC
Bilirubin Urine: NEGATIVE
Ketones, ur: NEGATIVE mg/dL
Nitrite: NEGATIVE
Protein, ur: NEGATIVE mg/dL
pH: 7 (ref 5.0–8.0)

## 2011-05-17 LAB — ABO/RH: ABO/RH(D): A POS

## 2011-05-17 MED ORDER — KETOROLAC TROMETHAMINE 60 MG/2ML IM SOLN
60.0000 mg | Freq: Once | INTRAMUSCULAR | Status: AC
  Administered 2011-05-17: 60 mg via INTRAMUSCULAR
  Filled 2011-05-17: qty 2

## 2011-05-17 MED ORDER — NAPROXEN SODIUM 550 MG PO TABS: 550.0000 mg | ORAL_TABLET | Freq: Two times a day (BID) | ORAL | Status: AC

## 2011-05-17 NOTE — ED Notes (Signed)
Informed pt  That blood pregnancy test was negative also and u/s did not show any pregnancy. It is possible she had a misscariage ealier last month or so but pt has no hormone level left to show a pregnancy existed. Pt given instructions to follow up with GYN clinic if irregular periods persist. Pt verbalized understanding.

## 2011-05-17 NOTE — Progress Notes (Signed)
Low back hurting since Dec.  Hurts to sit.  Migraines come and go. Taking meds, no relief.  3 + HPT beginning of Jan, + test in Dec. 2 period in Nov.  This morning, past large blood clot.

## 2011-05-17 NOTE — ED Provider Notes (Signed)
History     CSN: [id number]  Arrival date & time 05/17/11  1221   None     No chief complaint on file.   HPI Dana Hughes is a 31 y.o. female @ 11.[redacted] weeks gestation by LMP of 02/22/11. Positive HPT x 4. She complains of pain in the lower abdomen with radiation to the lower back.  The pain started in December and has been off and on. Tried to have sex 2 nights ago but was very painful and started bleeding.  Current sex partner x 4 months. Hx of trichomonas. Last pap smear one year ago and was normal. The history was provided by the patient.  Past Medical History  Diagnosis Date  . Hypertension     no longer takes meds  . Asthma   . Anxiety   . Depression   . Headache     migraines    Past Surgical History  Procedure Date  . Cholecystectomy   . Tubal ligation     pt has had pregnancy after and had  miscarriage    No family history on file.  History  Substance Use Topics  . Smoking status: Never Smoker   . Smokeless tobacco: Not on file  . Alcohol Use: Yes    OB History    Grav Para Term Preterm Abortions TAB SAB Ect Mult Living   4 3 3  0 1 0 1 0 0 3      Review of Systems  Constitutional: Negative for fever, chills, diaphoresis and fatigue.  HENT: Negative for ear pain, congestion, sore throat, facial swelling, neck pain, neck stiffness, dental problem and sinus pressure.   Eyes: Negative for photophobia, pain and discharge.  Respiratory: Negative for cough, chest tightness and wheezing.   Gastrointestinal: Positive for nausea and abdominal pain. Negative for vomiting, diarrhea, constipation and abdominal distention.  Genitourinary: Positive for frequency, vaginal bleeding, vaginal discharge, pelvic pain and dyspareunia. Negative for dysuria, flank pain and difficulty urinating.  Musculoskeletal: Positive for back pain. Negative for myalgias and gait problem.  Skin: Negative for color change and rash.  Neurological: Positive for headaches. Negative for  dizziness, speech difficulty, weakness, light-headedness and numbness.  Psychiatric/Behavioral: Negative for confusion and agitation. The patient is not nervous/anxious.     Allergies  Penicillins and Percocet  Home Medications  No current outpatient prescriptions on file.  BP 121/84  Pulse 68  Temp(Src) 98.5 F (36.9 C) (Oral)  Resp 20  Ht 5\' 3"  (1.6 m)  Wt 201 lb (91.173 kg)  BMI 35.61 kg/m2  SpO2 99%  LMP 02/22/2011  Physical Exam  Nursing note and vitals reviewed. Constitutional: She is oriented to person, place, and time. She appears well-developed and well-nourished.  HENT:  Head: Normocephalic.  Eyes: EOM are normal.  Neck: Neck supple.  Cardiovascular: Normal rate.   Pulmonary/Chest: Effort normal.  Abdominal: Soft. There is tenderness in the right lower quadrant and left lower quadrant. There is no rebound, no guarding and no CVA tenderness.  Genitourinary:       External genitalia  Without lesions. Small amount of blood vaginal vault. Cervix closed, positive CMT, bilateral adnexal tenderness. Uterus enlarged, exam limited due to patient habitus.  Musculoskeletal: Normal range of motion.  Neurological: She is alert and oriented to person, place, and time. No cranial nerve deficit.  Skin: Skin is warm and dry.  Psychiatric: She has a normal mood and affect. Her behavior is normal. Judgment and thought content normal.   Results for  orders placed during the hospital encounter of 05/17/11 (from the past 24 hour(s))  URINALYSIS, ROUTINE W REFLEX MICROSCOPIC     Status: Normal   Collection Time   05/17/11 12:44 PM      Component Value Range   Color, Urine YELLOW  YELLOW    APPearance CLEAR  CLEAR    Specific Gravity, Urine 1.020  1.005 - 1.030    pH 7.0  5.0 - 8.0    Glucose, UA NEGATIVE  NEGATIVE (mg/dL)   Hgb urine dipstick NEGATIVE  NEGATIVE    Bilirubin Urine NEGATIVE  NEGATIVE    Ketones, ur NEGATIVE  NEGATIVE (mg/dL)   Protein, ur NEGATIVE  NEGATIVE (mg/dL)     Urobilinogen, UA 0.2  0.0 - 1.0 (mg/dL)   Nitrite NEGATIVE  NEGATIVE    Leukocytes, UA NEGATIVE  NEGATIVE   WET PREP, GENITAL     Status: Abnormal   Collection Time   05/17/11  1:23 PM      Component Value Range   Yeast, Wet Prep NONE SEEN  NONE SEEN    Trich, Wet Prep NONE SEEN  NONE SEEN    Clue Cells, Wet Prep FEW (*) NONE SEEN    WBC, Wet Prep HPF POC FEW (*) NONE SEEN   ABO/RH     Status: Normal   Collection Time   05/17/11  1:24 PM      Component Value Range   ABO/RH(D) A POS    CBC     Status: Abnormal   Collection Time   05/17/11  1:25 PM      Component Value Range   WBC 10.3  4.0 - 10.5 (K/uL)   RBC 3.89  3.87 - 5.11 (MIL/uL)   Hemoglobin 11.6 (*) 12.0 - 15.0 (g/dL)   HCT 16.1 (*) 09.6 - 46.0 (%)   MCV 90.5  78.0 - 100.0 (fL)   MCH 29.8  26.0 - 34.0 (pg)   MCHC 33.0  30.0 - 36.0 (g/dL)   RDW 04.5  40.9 - 81.1 (%)   Platelets 320  150 - 400 (K/uL)  DIFFERENTIAL     Status: Normal   Collection Time   05/17/11  1:25 PM      Component Value Range   Neutrophils Relative 63  43 - 77 (%)   Neutro Abs 6.5  1.7 - 7.7 (K/uL)   Lymphocytes Relative 29  12 - 46 (%)   Lymphs Abs 3.0  0.7 - 4.0 (K/uL)   Monocytes Relative 6  3 - 12 (%)   Monocytes Absolute 0.6  0.1 - 1.0 (K/uL)   Eosinophils Relative 2  0 - 5 (%)   Eosinophils Absolute 0.2  0.0 - 0.7 (K/uL)   Basophils Relative 0  0 - 1 (%)   Basophils Absolute 0.0  0.0 - 0.1 (K/uL)  HCG, QUANTITATIVE, PREGNANCY     Status: Normal   Collection Time   05/17/11  1:28 PM      Component Value Range   hCG, Beta Chain, Quant, S <1  <5 (mIU/mL)  POCT PREGNANCY, URINE     Status: Normal   Collection Time   05/17/11  1:29 PM      Component Value Range   Preg Test, Ur NEGATIVE  NEGATIVE    Assessment: Dysmenorrha   Low back pain  Plan:  Rx Anaprox   Follow up in GYN Clinic    Return as needed     ED Course  Procedures   MDM  [place], [place] 05/17/11 1553

## 2011-08-15 ENCOUNTER — Encounter (HOSPITAL_COMMUNITY): Payer: Self-pay | Admitting: Emergency Medicine

## 2011-08-15 ENCOUNTER — Emergency Department (HOSPITAL_COMMUNITY)
Admission: EM | Admit: 2011-08-15 | Discharge: 2011-08-15 | Disposition: A | Discharge status: Home / Self Care | Payer: Self-pay | Attending: Emergency Medicine | Admitting: Emergency Medicine

## 2011-08-15 DIAGNOSIS — F329 Major depressive disorder, single episode, unspecified: Secondary | ICD-10-CM | POA: Insufficient documentation

## 2011-08-15 DIAGNOSIS — F3289 Other specified depressive episodes: Secondary | ICD-10-CM | POA: Insufficient documentation

## 2011-08-15 DIAGNOSIS — I10 Essential (primary) hypertension: Secondary | ICD-10-CM | POA: Insufficient documentation

## 2011-08-15 DIAGNOSIS — R51 Headache: Secondary | ICD-10-CM | POA: Insufficient documentation

## 2011-08-15 DIAGNOSIS — J45909 Unspecified asthma, uncomplicated: Secondary | ICD-10-CM | POA: Insufficient documentation

## 2011-08-15 DIAGNOSIS — F411 Generalized anxiety disorder: Secondary | ICD-10-CM | POA: Insufficient documentation

## 2011-08-15 MED ORDER — ONDANSETRON HCL 4 MG PO TABS: 4.0000 mg | ORAL_TABLET | Freq: Four times a day (QID) | ORAL | Status: DC

## 2011-08-15 MED ORDER — ONDANSETRON HCL 4 MG PO TABS: 8.0000 mg | ORAL_TABLET | Freq: Four times a day (QID) | ORAL | Status: DC

## 2011-08-15 MED ORDER — SUMATRIPTAN SUCCINATE 100 MG PO TABS: 100.0000 mg | ORAL_TABLET | ORAL | Status: DC | PRN

## 2011-08-15 MED ORDER — KETOROLAC TROMETHAMINE 30 MG/ML IJ SOLN
30.0000 mg | Freq: Once | INTRAMUSCULAR | Status: AC
  Administered 2011-08-15: 30 mg via INTRAVENOUS
  Filled 2011-08-15: qty 1

## 2011-08-15 MED ORDER — METOCLOPRAMIDE HCL 5 MG/ML IJ SOLN
10.0000 mg | Freq: Once | INTRAMUSCULAR | Status: AC
  Administered 2011-08-15: 10 mg via INTRAVENOUS
  Filled 2011-08-15: qty 2

## 2011-08-15 MED ORDER — ONDANSETRON 8 MG PO TBDP: ORAL_TABLET | ORAL | Status: AC

## 2011-08-15 MED ORDER — ONDANSETRON HCL 4 MG/2ML IJ SOLN
4.0000 mg | Freq: Once | INTRAMUSCULAR | Status: AC
  Administered 2011-08-15: 4 mg via INTRAVENOUS
  Filled 2011-08-15: qty 2

## 2011-08-15 MED ORDER — SODIUM CHLORIDE 0.9 % IV BOLUS (SEPSIS)
1000.0000 mL | Freq: Once | INTRAVENOUS | Status: AC
  Administered 2011-08-15: 1000 mL via INTRAVENOUS

## 2011-08-15 NOTE — ED Notes (Signed)
Pt presenting to ed with c/o migraine headache x almost 4 weeks and nothing is helping. Pt states sensitivity to light and noise. Pt denies nausea and vomiting at this time. Pt is alert and oriented.

## 2011-08-15 NOTE — ED Provider Notes (Signed)
History     CSN: [id number]  Arrival date & time 08/15/11  1018   First MD Initiated Contact with Patient 08/15/11 1110      Chief Complaint  Patient presents with  . Migraine    (Consider location/radiation/quality/duration/timing/severity/associated sxs/prior treatment) HPI.... migraine headache intermittently for 3 and half weeks. Patient has tried over-the-counter products with no relief. She has used imitrex in the past with good relief. No associated stiff neck or neurological deficits. slight photophobia.  Symptoms are minimal to moderate. No radiation.  Past Medical History  Diagnosis Date  . Hypertension     no longer takes meds  . Asthma   . Anxiety   . Depression   . Headache     migraines    Past Surgical History  Procedure Date  . Cholecystectomy   . Tubal ligation     pt has had pregnancy after and had  miscarriage    No family history on file.  History  Substance Use Topics  . Smoking status: Never Smoker   . Smokeless tobacco: Not on file  . Alcohol Use: No    OB History    Grav Para Term Preterm Abortions TAB SAB Ect Mult Living   4 3 3  0 1 0 1 0 0 3      Review of Systems  All other systems reviewed and are negative.    Allergies  Penicillins and Percocet  Home Medications   Current Outpatient Rx  Name Route Sig Dispense Refill  . ACETAMINOPHEN 500 MG PO TABS Oral Take 1,000 mg by mouth every 6 (six) hours as needed. Patient takes for headaches     . ALBUTEROL SULFATE HFA 108 (90 BASE) MCG/ACT IN AERS Inhalation Inhale 2 puffs into the lungs every 6 (six) hours as needed. Patient takes for shortness of breath     . NAPROXEN SODIUM 550 MG PO TABS Oral Take 1 tablet (550 mg total) by mouth 2 (two) times daily with a meal. 15 tablet 0  . ONDANSETRON 8 MG PO TBDP  8mg  ODT q4 hours prn nausea 8 tablet 0  . SUMATRIPTAN SUCCINATE 100 MG PO TABS Oral Take 1 tablet (100 mg total) by mouth every 2 (two) hours as needed for migraine. 10 tablet  0    BP 117/81  Pulse 89  Temp(Src) 98.7 F (37.1 C) (Oral)  Resp 16  SpO2 100%  LMP 07/15/2011  Physical Exam  Nursing note and vitals reviewed. Constitutional: She is oriented to person, place, and time. She appears well-developed and well-nourished.  HENT:  Head: Normocephalic and atraumatic.  Eyes: Conjunctivae and EOM are normal. Pupils are equal, round, and reactive to light.  Neck: Normal range of motion. Neck supple.       No meningeal signs  Cardiovascular: Normal rate and regular rhythm.   Pulmonary/Chest: Effort normal and breath sounds normal.  Abdominal: Soft. Bowel sounds are normal.  Musculoskeletal: Normal range of motion.  Neurological: She is alert and oriented to person, place, and time.  Skin: Skin is warm and dry.  Psychiatric: She has a normal mood and affect.    ED Course  Procedures (including critical care time)  Labs Reviewed - No data to display No results found.   1. Headache       MDM  Hydrate , IV Toradol, IV Reglan, IV Zofran   1330 recheck prior to discharge. No neuro deficits. Feeling much better.    Donnetta Hutching, MD 08/15/11 [phone]

## 2011-08-15 NOTE — ED Notes (Signed)
States feels much better.

## 2011-08-15 NOTE — Discharge Instructions (Signed)
Increase fluids, medication for headaches nausea

## 2011-08-21 ENCOUNTER — Emergency Department (HOSPITAL_COMMUNITY)
Admission: EM | Admit: 2011-08-21 | Discharge: 2011-08-21 | Disposition: A | Discharge status: Home / Self Care | Payer: Self-pay | Attending: Emergency Medicine | Admitting: Emergency Medicine

## 2011-08-21 ENCOUNTER — Encounter (HOSPITAL_COMMUNITY): Payer: Self-pay | Admitting: *Deleted

## 2011-08-21 DIAGNOSIS — I10 Essential (primary) hypertension: Secondary | ICD-10-CM | POA: Insufficient documentation

## 2011-08-21 DIAGNOSIS — R51 Headache: Secondary | ICD-10-CM | POA: Insufficient documentation

## 2011-08-21 DIAGNOSIS — J3489 Other specified disorders of nose and nasal sinuses: Secondary | ICD-10-CM | POA: Insufficient documentation

## 2011-08-21 DIAGNOSIS — Z79899 Other long term (current) drug therapy: Secondary | ICD-10-CM | POA: Insufficient documentation

## 2011-08-21 DIAGNOSIS — J45909 Unspecified asthma, uncomplicated: Secondary | ICD-10-CM | POA: Insufficient documentation

## 2011-08-21 DIAGNOSIS — H53149 Visual discomfort, unspecified: Secondary | ICD-10-CM | POA: Insufficient documentation

## 2011-08-21 DIAGNOSIS — F341 Dysthymic disorder: Secondary | ICD-10-CM | POA: Insufficient documentation

## 2011-08-21 MED ORDER — PROMETHAZINE HCL 25 MG PO TABS: 25.0000 mg | ORAL_TABLET | Freq: Four times a day (QID) | ORAL | Status: DC | PRN

## 2011-08-21 MED ORDER — HYDROCODONE-ACETAMINOPHEN 5-325 MG PO TABS
1.0000 | ORAL_TABLET | Freq: Once | ORAL | Status: AC
  Administered 2011-08-21: 1 via ORAL
  Filled 2011-08-21: qty 1

## 2011-08-21 MED ORDER — PROMETHAZINE HCL 25 MG PO TABS
25.0000 mg | ORAL_TABLET | Freq: Once | ORAL | Status: AC
  Administered 2011-08-21: 25 mg via ORAL
  Filled 2011-08-21: qty 1

## 2011-08-21 NOTE — Discharge Instructions (Signed)
RECOMMEND PLAIN SALINE NASAL SPRAY FOR SYMPTOMATIC RELIEF OF SINUS PRESSURE. CONTINUE PROMETHAZINE AS PRESCRIBED. ALSO RECOMMEND ZYRTEC-D. FOLLOW UP WITH THE HEADACHE CLINIC OR WITH A PRIMARY CARE DOCTOR OF YOUR CHOICE FOR FURTHER EVALUATION IF HEADACHE PERSISTS.  Headache and Allergies The relationship between allergies and headaches is unclear. Many people with allergic or infectious nasal problems also have headaches (migraines or sinus headaches). However, sometimes allergies can cause pressure that feels like a headache, and sometimes headaches can cause allergy-like symptoms. It is not always clear whether your symptoms are caused by allergies or by a headache. CAUSES   Migraine: The cause of a migraine is not always known.   Sinus Headache: The cause of a sinus headache may be a sinus infection. Other conditions that may be related to sinus headaches include:   Hay fever (allergic rhinitis).   Deviation of the nasal septum.   Swelling or clogging of the nasal passages.  SYMPTOMS  Migraine headache symptoms (which often last 4 to 72 hours) include:  Intense, throbbing pain on one or both sides of the head.   Nausea.   Vomiting.   Being extra sensitive to light.   Being extra sensitive to sound.   Nervous system reactions that appear similar to an allergic reaction:   Stuffy nose.   Runny nose.   Tearing.  Sinus headaches are felt as facial pain or pressure.  DIAGNOSIS  Because there is some overlap in symptoms, sinus and migraine headaches are often misdiagnosed. For example, a person with migraines may also feel facial pressure. Likewise, many people with hay fever may get migraine headaches rather than sinus headaches. These migraines can be triggered by the histamine release during an allergic reaction. An antihistamine medicine can eliminate this pain. There are standard criteria that help clarify the difference between these headaches and related allergy or  allergy-like symptoms. Your caregiver can use these criteria to determine the proper diagnosis and provide you the best care. TREATMENT  Migraine medicine may help people who have persistent migraine headaches even though their hay fever is controlled. For some people, anti-inflammatory treatments do not work to relieve migraines. Medicines called triptans (such as sumatriptan) can be helpful for those people. Document Released: 06/30/2003 Document Revised: 03/29/2011 Document Reviewed: 07/22/2009 [facility] Patient Information 2012 [place], [place].

## 2011-08-21 NOTE — ED Notes (Signed)
Pt reports migraine x1wk. Seen 2 days ago, given Rx, sts isn't working. C/o light and sound sensitivity, denies n/v. Also reports possible boil on buttocks that she would like checked.

## 2011-08-21 NOTE — ED Notes (Signed)
H/O of Migraines. States "little white pill" is not working. Started with headache last pm. Also states she has a "boil" on left buttock.

## 2011-08-21 NOTE — ED Provider Notes (Signed)
Medical screening examination/treatment/procedure(s) were performed by non-physician practitioner and as supervising physician I was immediately available for consultation/collaboration.   Kyrene Longan A Kanyon Bunn, MD 08/21/11 1523 

## 2011-08-21 NOTE — ED Provider Notes (Signed)
History     CSN: [id number]  Arrival date & time 08/21/11  1158   First MD Initiated Contact with Patient 08/21/11 1407      Chief Complaint  Patient presents with  . Headache  . Abscess    (Consider location/radiation/quality/duration/timing/severity/associated sxs/prior treatment) Patient is a 31 y.o. female presenting with headaches and abscess. The history is provided by the patient.  Headache  The current episode started more than 1 week ago. The problem occurs constantly. The problem has not changed since onset.Pertinent negatives include no fever, no nausea and no vomiting. Associated symptoms comments: Bilateral "migraine" frontal headache for 3 months, intermittent. No fever. She reports some congestion, sinus pressure. No N, V..  Abscess  Associated symptoms include congestion and rhinorrhea. Pertinent negatives include no fever, no vomiting and no cough.    Past Medical History  Diagnosis Date  . Hypertension     no longer takes meds  . Asthma   . Anxiety   . Depression   . Headache     migraines    Past Surgical History  Procedure Date  . Cholecystectomy   . Tubal ligation     pt has had pregnancy after and had  miscarriage    No family history on file.  History  Substance Use Topics  . Smoking status: Former Games developer  . Smokeless tobacco: Not on file  . Alcohol Use: No    OB History    Grav Para Term Preterm Abortions TAB SAB Ect Mult Living   4 3 3  0 1 0 1 0 0 3      Review of Systems  Constitutional: Negative.  Negative for fever.  HENT: Positive for congestion and rhinorrhea. Negative for neck pain.   Eyes: Positive for photophobia. Negative for discharge and visual disturbance.  Respiratory: Negative for cough.   Gastrointestinal: Negative for nausea and vomiting.  Neurological: Positive for headaches.    Allergies  Penicillins and Percocet  Home Medications   Current Outpatient Rx  Name Route Sig Dispense Refill  . ACETAMINOPHEN  500 MG PO TABS Oral Take 1,000 mg by mouth every 6 (six) hours as needed. Patient takes for headaches     . ALBUTEROL SULFATE HFA 108 (90 BASE) MCG/ACT IN AERS Inhalation Inhale 2 puffs into the lungs every 6 (six) hours as needed. Patient takes for shortness of breath     . NAPROXEN SODIUM 550 MG PO TABS Oral Take 1 tablet (550 mg total) by mouth 2 (two) times daily with a meal. 15 tablet 0  . ONDANSETRON 8 MG PO TBDP  8mg  ODT q4 hours prn nausea 8 tablet 0  . SUMATRIPTAN SUCCINATE 100 MG PO TABS Oral Take 1 tablet (100 mg total) by mouth every 2 (two) hours as needed for migraine. 10 tablet 0    BP 126/80  Pulse 73  Temp(Src) 98.2 F (36.8 C) (Oral)  Resp 14  SpO2 100%  LMP 07/15/2011  Physical Exam  Constitutional: She is oriented to person, place, and time. She appears well-developed and well-nourished.  HENT:  Head: Normocephalic.  Eyes: Pupils are equal, round, and reactive to light.  Neck: Normal range of motion. Neck supple.  Cardiovascular: Normal rate and regular rhythm.   Pulmonary/Chest: Effort normal and breath sounds normal.  Abdominal: Soft. Bowel sounds are normal. There is no tenderness. There is no rebound and no guarding.  Musculoskeletal: Normal range of motion.  Neurological: She is alert and oriented to person, place, and time. She  has normal strength and normal reflexes. No sensory deficit. She displays a negative Romberg sign. Coordination normal.       Cranial nerves 3-12 grossly intact. Patient ambulatory with stead gait.  Skin: Skin is warm and dry. No rash noted.  Psychiatric: She has a normal mood and affect.    ED Course  Procedures (including critical care time)  Labs Reviewed - No data to display No results found.   No diagnosis found.  1. Sinus headache  MDM  Neuro exam normal, VSS and normal. Suspect sinus headache over migraine.         Rodena Medin, PA-C 08/21/11 1454

## 2011-08-21 NOTE — Progress Notes (Signed)
CM spoke with pt who confirms she does not have a pcp and lives in [place]. Discussed the importance of a pcp for f/u. Reviewed [facility] number to assist with finding self pay provider close to her residence. Reviewed resources for medications, housing, DSS, health Department and other resources in [place] Pt voiced understanding and appreciation of resources provided

## 2013-07-15 ENCOUNTER — Emergency Department (HOSPITAL_COMMUNITY)
Admission: EM | Admit: 2013-07-15 | Discharge: 2013-07-15 | Disposition: A | Discharge status: Home / Self Care | Payer: Self-pay | Attending: Emergency Medicine | Admitting: Emergency Medicine

## 2013-07-15 ENCOUNTER — Encounter (HOSPITAL_COMMUNITY): Payer: Self-pay | Admitting: Emergency Medicine

## 2013-07-15 ENCOUNTER — Emergency Department (HOSPITAL_COMMUNITY): Payer: Medicaid Other

## 2013-07-15 DIAGNOSIS — Z87891 Personal history of nicotine dependence: Secondary | ICD-10-CM | POA: Insufficient documentation

## 2013-07-15 DIAGNOSIS — J45909 Unspecified asthma, uncomplicated: Secondary | ICD-10-CM | POA: Insufficient documentation

## 2013-07-15 DIAGNOSIS — I10 Essential (primary) hypertension: Secondary | ICD-10-CM | POA: Insufficient documentation

## 2013-07-15 DIAGNOSIS — K047 Periapical abscess without sinus: Secondary | ICD-10-CM | POA: Insufficient documentation

## 2013-07-15 DIAGNOSIS — K029 Dental caries, unspecified: Secondary | ICD-10-CM | POA: Insufficient documentation

## 2013-07-15 DIAGNOSIS — Z8659 Personal history of other mental and behavioral disorders: Secondary | ICD-10-CM | POA: Insufficient documentation

## 2013-07-15 DIAGNOSIS — G43909 Migraine, unspecified, not intractable, without status migrainosus: Secondary | ICD-10-CM | POA: Insufficient documentation

## 2013-07-15 DIAGNOSIS — Z8739 Personal history of other diseases of the musculoskeletal system and connective tissue: Secondary | ICD-10-CM | POA: Insufficient documentation

## 2013-07-15 HISTORY — DX: Other muscle spasm: M62.838

## 2013-07-15 HISTORY — DX: Scoliosis, unspecified: M41.9

## 2013-07-15 LAB — CBC WITH DIFFERENTIAL/PLATELET
BASOS PCT: 0 % (ref 0–1)
Basophils Absolute: 0 10*3/uL (ref 0.0–0.1)
Eosinophils Absolute: 0.1 10*3/uL (ref 0.0–0.7)
Eosinophils Relative: 1 % (ref 0–5)
HEMATOCRIT: 37.4 % (ref 36.0–46.0)
HEMOGLOBIN: 12.5 g/dL (ref 12.0–15.0)
Lymphocytes Relative: 28 % (ref 12–46)
Lymphs Abs: 3.3 10*3/uL (ref 0.7–4.0)
MCH: 30.4 pg (ref 26.0–34.0)
MCHC: 33.4 g/dL (ref 30.0–36.0)
MCV: 91 fL (ref 78.0–100.0)
MONO ABS: 0.9 10*3/uL (ref 0.1–1.0)
MONOS PCT: 7 % (ref 3–12)
NEUTROS ABS: 7.7 10*3/uL (ref 1.7–7.7)
Neutrophils Relative %: 64 % (ref 43–77)
Platelets: 336 10*3/uL (ref 150–400)
RBC: 4.11 MIL/uL (ref 3.87–5.11)
RDW: 13.7 % (ref 11.5–15.5)
WBC: 12.1 10*3/uL — ABNORMAL HIGH (ref 4.0–10.5)

## 2013-07-15 LAB — BASIC METABOLIC PANEL
BUN: 8 mg/dL (ref 6–23)
CO2: 23 mEq/L (ref 19–32)
Calcium: 9.3 mg/dL (ref 8.4–10.5)
Chloride: 102 mEq/L (ref 96–112)
Creatinine, Ser: 0.77 mg/dL (ref 0.50–1.10)
GFR calc Af Amer: >90 mL/min (ref >90)
GLUCOSE: 72 mg/dL (ref 70–99)
POTASSIUM: 4 meq/L (ref 3.7–5.3)
Sodium: 139 mEq/L (ref 137–147)

## 2013-07-15 LAB — HCG, SERUM, QUALITATIVE: Preg, Serum: NEGATIVE

## 2013-07-15 MED ORDER — PROMETHAZINE HCL 25 MG PO TABS
25.0000 mg | ORAL_TABLET | Freq: Four times a day (QID) | ORAL | Status: DC | PRN
Start: 2013-07-15 — End: 2013-08-29

## 2013-07-15 MED ORDER — HYDROCODONE-ACETAMINOPHEN 5-325 MG PO TABS: 1.0000 | ORAL_TABLET | ORAL | Status: DC | PRN

## 2013-07-15 MED ORDER — KETOROLAC TROMETHAMINE 30 MG/ML IJ SOLN
30.0000 mg | Freq: Once | INTRAMUSCULAR | Status: AC
  Administered 2013-07-15: 30 mg via INTRAVENOUS
  Filled 2013-07-15: qty 1

## 2013-07-15 MED ORDER — CLINDAMYCIN PHOSPHATE 600 MG/50ML IV SOLN
600.0000 mg | Freq: Once | INTRAVENOUS | Status: AC
  Administered 2013-07-15: 600 mg via INTRAVENOUS
  Filled 2013-07-15: qty 50

## 2013-07-15 MED ORDER — CLINDAMYCIN HCL 150 MG PO CAPS: 450.0000 mg | ORAL_CAPSULE | Freq: Three times a day (TID) | ORAL | Status: DC

## 2013-07-15 MED ORDER — SODIUM CHLORIDE 0.9 % IV BOLUS (SEPSIS)
1000.0000 mL | Freq: Once | INTRAVENOUS | Status: AC
  Administered 2013-07-15: 1000 mL via INTRAVENOUS

## 2013-07-15 MED ORDER — IOHEXOL 300 MG/ML  SOLN
80.0000 mL | Freq: Once | INTRAMUSCULAR | Status: AC | PRN
  Administered 2013-07-15: 80 mL via INTRAVENOUS

## 2013-07-15 NOTE — ED Notes (Signed)
Pt ambulates to bathroom

## 2013-07-15 NOTE — ED Provider Notes (Signed)
TIME SEEN: 9:35 AM  CHIEF COMPLAINT: Possible dental abscess   HPI: Pt is a 33 yo female with history of hypertension, asthma, depression, migraines who presents to the emergency department with complaints of right-sided facial swelling, dental pain and migraine headache.  She states that she has had dental pain for the past week but facial swelling that started this morning. No fevers or chills, vomiting or diarrhea the patient has had nausea. She complains of a diffuse throbbing headache that is similar to her prior migraines. No thunderclap headache for several onset. No numbness, tingling or focal weakness. No history of head injury. She is on anticoagulation. No neck pain or neck stiffness. She does not have a dentist to followup with.   ROS: See HPI Constitutional: no fever  Eyes: no drainage  ENT: no runny nose   Cardiovascular:  no chest pain  Resp: no SOB  GI: no vomiting GU: no dysuria Integumentary: no rash  Allergy: no hives  Musculoskeletal: no leg swelling  Neurological: no slurred speech ROS otherwise negative  PAST MEDICAL HISTORY/PAST SURGICAL HISTORY:  Past Medical History  Diagnosis Date  . Hypertension     no longer takes meds  . Asthma   . Anxiety   . Depression   . Headache(784.0)     migraines  . Muscle spasm   . Scoliosis     MEDICATIONS:  Prior to Admission medications   Medication Sig Start Date End Date Taking? Authorizing Provider  acetaminophen (TYLENOL) 500 MG tablet Take 1,000 mg by mouth every 6 (six) hours as needed. Patient takes for headaches     Historical Provider, MD  albuterol (PROVENTIL HFA;VENTOLIN HFA) 108 (90 BASE) MCG/ACT inhaler Inhale 2 puffs into the lungs every 6 (six) hours as needed. Patient takes for shortness of breath     Historical Provider, MD  promethazine (PHENERGAN) 25 MG tablet Take 1 tablet (25 mg total) by mouth every 6 (six) hours as needed for nausea. 08/21/11 08/28/11  Shari A Upstill, PA-C  SUMAtriptan (IMITREX) 100  MG tablet Take 1 tablet (100 mg total) by mouth every 2 (two) hours as needed for migraine. 08/15/11 08/14/12  Nat Christen, MD    ALLERGIES:  Allergies  Allergen Reactions  . Penicillins Itching  . Percocet [Oxycodone-Acetaminophen] Itching    SOCIAL HISTORY:  History  Substance Use Topics  . Smoking status: Former Research scientist (life sciences)  . Smokeless tobacco: Not on file  . Alcohol Use: Yes    FAMILY HISTORY: No family history on file.  EXAM: BP 131/92  Pulse 81  Temp(Src) 99.1 F (37.3 C) (Oral)  Resp 18  Wt 208 lb (94.348 kg)  SpO2 100%  LMP 07/01/2013 CONSTITUTIONAL: Alert and oriented and responds appropriately to questions. Well-appearing; well-nourished HEAD: Normocephalic EYES: Conjunctivae clear, PERRL ENT: normal nose; no rhinorrhea; moist mucous membranes; pharynx without lesions noted; multiple dental caries, patient has a very small almost ulcerated appearing lesion to the, underneath the right lower premolar that is bleeding but has no purulent drainage, multiple dental caries and this right lower premolar is loose, no trismus or drooling, no uvular deviation, no tonsillar hypertrophy or exudate, minimal amount of swelling to the right side of the face at the angle of the mandible but no submandibular swelling or swelling underneath the tongue or lesions under her tongue  NECK: Supple, no meningismus, no LAD  CARD: RRR; S1 and S2 appreciated; no murmurs, no clicks, no rubs, no gallops RESP: Normal chest excursion without splinting or tachypnea; breath  sounds clear and equal bilaterally; no wheezes, no rhonchi, no rales,  ABD/GI: Normal bowel sounds; non-distended; soft, non-tender, no rebound, no guarding BACK:  The back appears normal and is non-tender to palpation, there is no CVA tenderness EXT: Normal ROM in all joints; non-tender to palpation; no edema; normal capillary refill; no cyanosis    SKIN: Normal color for age and race; warm NEURO: Moves all extremities  equally PSYCH: The patient's mood and manner are appropriate. Grooming and personal hygiene are appropriate.  MEDICAL DECISION MAKING: patient here with likely dental abscess but given her facial swelling and a small lesion in her mouth, will obtain CT imaging. We'll also obtain labs and give pain medication.  ED PROGRESS: Patient's labs show mild leukocytosis. She has received IV clindamycin in the emergency department.  Her CT scan shows a periapical abscess and significant periodontal disease and cellulitic changes. Have discussed with patient she will need to followup with her dentist. Will get outpatient followup resources. Will discharge with prescription for clindamycin as patient is allergic to penicillin. We'll discharge with pain medication and nausea medicine. She reports her headache is improved after IV morphine. Have discussed her to return precautions with the patient and she is comfortable with the plan to be discharged home.     [place], DO 07/15/13 1626

## 2013-07-15 NOTE — ED Notes (Signed)
Pt ambulates without distress. Respirations easy non labored. Pt taking bus home.

## 2013-07-15 NOTE — ED Notes (Signed)
Pt reports white bump to inside of right lower mouth; has gotten bigger and this morning woke up with swelling to right lower jaw and migraine. States oral swelling is triggering her migraines. Pt is a x 4. In NAD

## 2013-07-15 NOTE — Discharge Instructions (Signed)
Dental Abscess A dental abscess is a collection of infected fluid (pus) from a bacterial infection in the inner part of the tooth (pulp). It usually occurs at the end of the tooth's root.  CAUSES   Severe tooth decay.  Trauma to the tooth that allows bacteria to enter into the pulp, such as a broken or chipped tooth. SYMPTOMS   Severe pain in and around the infected tooth.  Swelling and redness around the abscessed tooth or in the mouth or face.  Tenderness.  Pus drainage.  Bad breath.  Bitter taste in the mouth.  Difficulty swallowing.  Difficulty opening the mouth.  Nausea.  Vomiting.  Chills.  Swollen neck glands. DIAGNOSIS   A medical and dental history will be taken.  An examination will be performed by tapping on the abscessed tooth.  X-rays may be taken of the tooth to identify the abscess. TREATMENT The goal of treatment is to eliminate the infection. You may be prescribed antibiotic medicine to stop the infection from spreading. A root canal may be performed to save the tooth. If the tooth cannot be saved, it may be pulled (extracted) and the abscess may be drained.  HOME CARE INSTRUCTIONS  Only take over-the-counter or prescription medicines for pain, fever, or discomfort as directed by your caregiver.  Rinse your mouth (gargle) often with salt water ( tsp salt in 8 oz [250 ml] of warm water) to relieve pain or swelling.  Do not drive after taking pain medicine (narcotics).  Do not apply heat to the outside of your face.  Return to your dentist for further treatment as directed. SEEK MEDICAL CARE IF:  Your pain is not helped by medicine.  Your pain is getting worse instead of better. SEEK IMMEDIATE MEDICAL CARE IF:  You have a fever or persistent symptoms for more than 2 3 days.  You have a fever and your symptoms suddenly get worse.  You have chills or a very bad headache.  You have problems breathing or swallowing.  You have trouble  opening your mouth.  You have swelling in the neck or around the eye. Document Released: 04/09/2005 Document Revised: 01/02/2012 Document Reviewed: 07/18/2010 [facility] Patient Information 2014 [place], [place].  Dental Caries  Dental caries (also called tooth decay) is the most common oral disease. It can occur at any age, but is more common in children and young adults.  HOW DENTAL CARIES DEVELOPS  The process of decay begins when bacteria and foods (particularly sugars and starches) combine in your mouth to produce plaque. Plaque is a substance that sticks to the hard, outer surface of a tooth (enamel). The bacteria in plaque produce acids that attack enamel. These acids may also attack the root surface of a tooth (cementum) if it is exposed. Repeated attacks dissolve these surfaces and create holes in the tooth (cavities). If left untreated, the acids destroy the other layers of the tooth.  RISK FACTORS  Frequent sipping of sugary beverages.   Frequent snacking on sugary and starchy foods, especially those that easily get stuck in the teeth.   Poor oral hygiene.   Dry mouth.   Substance abuse such as methamphetamine abuse.   Broken or poor-fitting dental restorations.   Eating disorders.   Gastroesophageal reflux disease (GERD).   Certain radiation treatments to the head and neck. SYMPTOMS In the early stages of dental caries, symptoms are seldom present. Sometimes white, chalky areas may be seen on the enamel or other tooth layers. In later stages,  symptoms may include:  Pits and holes on the enamel.  Toothache after sweet, hot, or cold foods or drinks are consumed.  Pain around the tooth.  Swelling around the tooth. DIAGNOSIS  Most of the time, dental caries is detected during a regular dental checkup. A diagnosis is made after a thorough medical and dental history is taken and the surfaces of your teeth are checked for signs of dental caries. Sometimes special  instruments, such as lasers, are used to check for dental caries. Dental X-ray exams may be taken so that areas not visible to the eye (such as between the contact areas of the teeth) can be checked for cavities.  TREATMENT  If dental caries is in its early stages, it may be reversed with a fluoride treatment or an application of a remineralizing agent at the dental office. Thorough brushing and flossing at home is needed to aid these treatments. If it is in its later stages, treatment depends on the location and extent of tooth destruction:   If a small area of the tooth has been destroyed, the destroyed area will be removed and cavities will be filled with a material such as gold, silver amalgam, or composite resin.   If a large area of the tooth has been destroyed, the destroyed area will be removed and a cap (crown) will be fitted over the remaining tooth structure.   If the center part of the tooth (pulp) is affected, a procedure called a root canal will be needed before a filling or crown can be placed.   If most of the tooth has been destroyed, the tooth may need to be pulled (extracted). HOME CARE INSTRUCTIONS You can prevent, stop, or reverse dental caries at home by practicing good oral hygiene. Good oral hygiene includes:  Thoroughly cleaning your teeth at least twice a day with a toothbrush and dental floss.   Using a fluoride toothpaste. A fluoride mouth rinse may also be used if recommended by your dentist or health care provider.   Restricting the amount of sugary and starchy foods and sugary liquids you consume.   Avoiding frequent snacking on these foods and sipping of these liquids.   Keeping regular visits with a dentist for checkups and cleanings. PREVENTION   Practice good oral hygiene.  Consider a dental sealant. A dental sealant is a coating material that is applied by your dentist to the pits and grooves of teeth. The sealant prevents food from being trapped  in them. It may protect the teeth for several years.  Ask about fluoride supplements if you live in a community without fluorinated water or with water that has a low fluoride content. Use fluoride supplements as directed by your dentist or health care provider.  Allow fluoride varnish applications to teeth if directed by your dentist or health care provider. Document Released: 12/30/2001 Document Revised: 12/10/2012 Document Reviewed: 04/11/2012 [facility] Patient Information 2014 [place].    Emergency Department Resource Guide 1) Find a Doctor and Pay Out of Pocket Although you won't have to find out who is covered by your insurance plan, it is a good idea to ask around and get recommendations. You will then need to call the office and see if the doctor you have chosen will accept you as a new patient and what types of options they offer for patients who are self-pay. Some doctors offer discounts or will set up payment plans for their patients who do not have insurance, but you will need  to ask so you aren't surprised when you get to your appointment.  2) Contact Your Local Health Department Not all health departments have doctors that can see patients for sick visits, but many do, so it is worth a call to see if yours does. If you don't know where your local health department is, you can check in your phone book. The CDC also has a tool to help you locate your state's health department, and many state websites also have listings of all of their local health departments.  3) Find a [facility] If your illness is not likely to be very severe or complicated, you may want to try a walk in clinic. These are popping up all over the country in pharmacies, drugstores, and shopping centers. They're usually staffed by nurse practitioners or physician assistants that have been trained to treat common illnesses and complaints. They're usually fairly quick and inexpensive. However, if you have  serious medical issues or chronic medical problems, these are probably not your best option.  No Primary Care Doctor: - Call Health Connect at  [phone] - they can help you locate a primary care doctor that  accepts your insurance, provides certain services, etc. - Physician Referral Service- [phone]  Chronic Pain Problems: Organization         Address  Phone   Notes  [facility]  [phone] Patients need to be referred by their primary care doctor.   Medication Assistance: Organization         Address  Phone   Notes  [facility] Medication [facility] [address] [place], [place] [address] [phone] --Must be a resident of [facility] -- Must have NO insurance coverage whatsoever (no Medicaid/ Medicare, etc.) -- The pt. MUST have a primary care doctor that directs their care regularly and follows them in the community   MedAssist  [phone]   [employer]  [phone]    Agencies that provide inexpensive medical care: Organization         Address  Phone   Notes  [place]  [phone]   [facility]    [phone]   [facility] [place], [place] [address] [phone]   [place] [address] [place] [phone]   Planned Parenthood    [phone]   [facility]    [phone]   [place] and [place], [place] Phone:  [phone], Fax:  [phone] Hours of Operation:  9 am - 6 pm, M-F.  Also accepts Medicaid/Medicare and self-pay.  [facility] for [address], [address], [place] Phone: [phone], Fax: [phone]. Hours of Operation:  8:30 am - 5:30 pm, M-F.  Also accepts Medicaid and self-pay.  [facility] [address], [place] Phone: [phone]   [place],  [place], [place] [phone], Ext. [phone] Mondays & Thursdays: 7-9 AM.  First 15 patients are seen on a first come, first serve basis.    [place] Providers:  Organization         Address  Phone   Notes  [facility] [address], [place] [phone] Also accepts self-pay patients.  [facility] [address], [place], [place]  (  [place]) [place], [address], [place] [phone]   [place] [address], [place] [phone]   Lucianne Lei [address], [place]   [phone] Only accepts [place] Access [place] patients after they have their name applied to their card.   Self-Pay (no insurance) in [facility]:  Organization         Address  Phone   Notes  Sickle Cell Patients, [facility] [place] [phone]   [facility] [place] [phone]   [facility] [place]  [place], [place], [place] [phone]   [facility]/Dr. Osei-Bonsu  [address], [place] or [address], [place] [phone] Phone number for both [place] and [place] locations is the same.  Urgent Medical and [facility] [address], [place] [phone]   [facility] [address], [place] or [address] [phone]   [facility] [address] [place] [phone], phone; [phone], fax Sees patients 1st and 3rd Saturday of every month.  Must not qualify for public or private insurance (i.e. Medicaid, Medicare, [place] Health Choice, Veterans' Benefits)  Household income should be no more than 200% of the poverty level The clinic cannot treat you if you are pregnant or think you are pregnant   Sexually transmitted diseases are not treated at the clinic.    Dental Care: Organization         Address  Phone  Notes  [facility] Department of [facility] [place] [phone] Accepts children up to age 33 who are enrolled in [place] or [place]; pregnant women with a Medicaid card; and children who have applied for Medicaid or [place] Health Choice, but were declined, whose parents can pay a reduced fee at time of service.  [facility] Department of [facility]  [address], [place] [phone] Accepts children up to age 56 who are enrolled in [place] or [place]; pregnant women with a Medicaid card; and children who have applied for Medicaid or [place] Health Choice, but were declined, whose parents can pay a reduced fee at time of service.  [place] Adult Dental Access PROGRAM  [place] [phone] Patients are seen by appointment only. Walk-ins are not accepted. [place] will see patients 73 years of age and older. Monday - Tuesday (8am-5pm) Most Wednesdays (8:30-5pm) $30 per visit, cash only  [facility] Adult Dental Access PROGRAM  [address], [facility] [phone] Patients are seen by appointment only. Walk-ins are not accepted. [place] will see patients 49 years of age and older. One Wednesday Evening (Monthly: Volunteer Based).  $30 per visit, cash only  [place]  [phone] for adults; Children under age 83, call Graduate Pediatric Dentistry at [phone]. Children aged 36-14, please call [phone] to request a pediatric application.  Dental services are provided in all areas of dental care including fillings, crowns and bridges, complete and partial dentures, implants, gum treatment, root canals, and extractions. Preventive care is also provided. Treatment is provided to both adults and children. Patients  are selected via a lottery and there is  often a waiting list.   [facility] [address] [place]  [phone] [email or web address]   [facility] [address] [place], [place] [phone], Ext. [phone] Second and Fourth Thursday of each month, opens at 6:30 AM; Clinic ends at 9 AM.  Patients are seen on a first-come first-served basis, and a limited number are seen during each clinic.   [facility]  [address] Hillard Danker [place], [place] [phone]   Eligibility Requirements You must have lived in [place], [place], or [place] counties for at least the last three months.   You cannot be eligible for state or federal sponsored [employer], including [employer], [place], or [employer].   You generally cannot be eligible for healthcare insurance through your employer.    How to apply: Eligibility screenings are held every Tuesday and Wednesday afternoon from 1:00 pm until 4:00 pm. You do not need an appointment for the interview!  [facility] [address] [place], [place]   [place]  [place] Department  [place]  [phone]    Behavioral Health Resources in the Community: Intensive Outpatient Programs Organization         Address  Phone  Notes  [place]. [address], [place], [place] [phone]   [facility]patient [address] [place], [place]   ADS: Alcohol & Drug Svcs [address] [place], [place]   [place] [address]  [place], [place] or [phone]   Substance Abuse Resources Organization         Address  Phone  Notes  Alcohol and Drug Services  [phone]   [place]  [phone]   [facility] [place]   Chinita Pester  [phone]    Residential & Outpatient Substance Abuse Program  [phone]   Psychological Services Organization         Address  Phone  Notes  [facility]  [place]  [phone]   [place] [address] [place] or [phone]    Mobile Crisis Teams Organization         Address  Phone  Notes  [facility], Mobile Crisis Care Unit  [phone]   [facility]  [address] [place], [place]   [place] [address] [place] [phone]    Self-Help/Support Groups Organization         Address  Phone             Notes  [place]. of [place] - variety of support groups  [place] Call for more information  Narcotics Anonymous (NA), [facility] [address], [employer] [place]  2 meetings at this location   Special educational needs teacher         Address  Phone  Notes  ASAP Residential Treatment [place],    [place]  [phone]   [facility]  [address] [place] [address], [place], [place]   [place], [place] [phone] Admissions: 8am-3pm M-F  Incentives Substance [place] [address]    [address], [place]   [facility] [address] [place], [place], [place]  [facility] [address]  [place], [place]   [facility]ight Programs - Intensive Outpatient [address], [place], [place]   [facility] ([address]  [place], [place] [phone] or [phone]   [facility] Services (RTS) [address] [place], [place] Accepts Medicaid  [facility] [place] [address],  [place] [phone] Substance Abuse/Addiction Treatment   [facility] Organization         Address  Phone  Notes  [facility]  [phone]   Domenic Schwab, PhD [address] [place]lis [place], [place]   [phone] or [phone]   [place], [place] [phone]   [place] [address], [place], [place] [phone] Insurance/Medicaid/sponsorship through [facility] [address]                                    [place], [place] [phone] [place] [address], [place] [phone]    Dr. Adele Schilder  [phone]   [facility] Dept. 1) [address], [place] 2) [place] 3)  [place] [address], [place] [phone]  [phone]   [place] [phone] or [phone] (After Hours)

## 2013-07-15 NOTE — ED Notes (Signed)
Pt ambulated to bathroom 

## 2013-07-15 NOTE — ED Notes (Signed)
Pt talking on cell phone in NAD

## 2013-08-04 ENCOUNTER — Ambulatory Visit: Payer: Medicaid Other

## 2013-08-09 ENCOUNTER — Encounter (HOSPITAL_COMMUNITY): Payer: Self-pay | Admitting: Emergency Medicine

## 2013-08-09 ENCOUNTER — Emergency Department (HOSPITAL_COMMUNITY)
Admission: EM | Admit: 2013-08-09 | Discharge: 2013-08-09 | Disposition: A | Discharge status: Home / Self Care | Payer: Medicaid Other | Attending: Emergency Medicine | Admitting: Emergency Medicine

## 2013-08-09 DIAGNOSIS — Z87891 Personal history of nicotine dependence: Secondary | ICD-10-CM | POA: Insufficient documentation

## 2013-08-09 DIAGNOSIS — Z792 Long term (current) use of antibiotics: Secondary | ICD-10-CM | POA: Insufficient documentation

## 2013-08-09 DIAGNOSIS — Z8739 Personal history of other diseases of the musculoskeletal system and connective tissue: Secondary | ICD-10-CM | POA: Insufficient documentation

## 2013-08-09 DIAGNOSIS — Z880 Allergy status to penicillin: Secondary | ICD-10-CM | POA: Insufficient documentation

## 2013-08-09 DIAGNOSIS — B359 Dermatophytosis, unspecified: Secondary | ICD-10-CM | POA: Insufficient documentation

## 2013-08-09 DIAGNOSIS — J020 Streptococcal pharyngitis: Secondary | ICD-10-CM

## 2013-08-09 DIAGNOSIS — B354 Tinea corporis: Secondary | ICD-10-CM

## 2013-08-09 DIAGNOSIS — I10 Essential (primary) hypertension: Secondary | ICD-10-CM | POA: Insufficient documentation

## 2013-08-09 DIAGNOSIS — J45909 Unspecified asthma, uncomplicated: Secondary | ICD-10-CM | POA: Insufficient documentation

## 2013-08-09 DIAGNOSIS — Z8659 Personal history of other mental and behavioral disorders: Secondary | ICD-10-CM | POA: Insufficient documentation

## 2013-08-09 MED ORDER — CLOTRIMAZOLE 1 % EX CREA: TOPICAL_CREAM | CUTANEOUS | Status: DC

## 2013-08-09 MED ORDER — ACETAMINOPHEN 500 MG PO TABS
1000.0000 mg | ORAL_TABLET | Freq: Once | ORAL | Status: AC
  Administered 2013-08-09: 1000 mg via ORAL
  Filled 2013-08-09: qty 2

## 2013-08-09 MED ORDER — TRAMADOL HCL 50 MG PO TABS: 50.0000 mg | ORAL_TABLET | Freq: Four times a day (QID) | ORAL | Status: DC | PRN

## 2013-08-09 MED ORDER — AZITHROMYCIN 250 MG PO TABS: 250.0000 mg | ORAL_TABLET | Freq: Every day | ORAL | Status: DC

## 2013-08-09 NOTE — ED Notes (Signed)
Pt presents to department for evaluation of sore throat. Ongoing since Friday. 5/10 pain at the time. Also states chills. No signs of distress noted.

## 2013-08-09 NOTE — ED Provider Notes (Signed)
Medical screening examination/treatment/procedure(s) were performed by non-physician practitioner and as supervising physician I was immediately available for consultation/collaboration.   EKG Interpretation None        Osvaldo Shipper, MD 08/09/13 1421

## 2013-08-09 NOTE — ED Provider Notes (Signed)
CSN: [id number]     Arrival date & time 08/09/13  1134 History  This chart was scribed for non-physician practitioner Alvina Chou, working with Osvaldo Shipper, MD by Donato Schultz, ED Scribe. This patient was seen in room TR08C/TR08C and the patient's care was started at 12:23 PM.    Chief Complaint  Patient presents with  . Sore Throat    The history is provided by the patient. No language interpreter was used.   HPI Comments: Dana Hughes is a 33 y.o. female who presents to the Emergency Department complaining of a constant sore throat that started two days ago.  She lists fever as an associated symptom.  The patient states that she has been gargling salt water with temporary relief to her symptoms.  The patient is also complaining of an itchy rash on the back of her left thigh.    She is also complaining of constant headaches.  She states that she has tried OTC medications with no relief to her symptoms.     Past Medical History  Diagnosis Date  . Hypertension     no longer takes meds  . Asthma   . Anxiety   . Depression   . Headache(784.0)     migraines  . Muscle spasm   . Scoliosis    Past Surgical History  Procedure Laterality Date  . Cholecystectomy    . Tubal ligation      pt has had pregnancy after and had  miscarriage   No family history on file. History  Substance Use Topics  . Smoking status: Former Research scientist (life sciences)  . Smokeless tobacco: Not on file  . Alcohol Use: Yes   OB History   Grav Para Term Preterm Abortions TAB SAB Ect Mult Living   4 3 3  0 1 0 1 0 0 3     Review of Systems  Constitutional: Positive for fever.  HENT: Positive for sore throat.   Skin: Positive for rash (back of left thigh).  Neurological: Positive for headaches.  All other systems reviewed and are negative.     Allergies  Penicillins and Percocet  Home Medications   Prior to Admission medications   Medication Sig Start Date End Date Taking? Authorizing Provider   albuterol (PROVENTIL HFA;VENTOLIN HFA) 108 (90 BASE) MCG/ACT inhaler Inhale 2 puffs into the lungs every 6 (six) hours as needed. Patient takes for shortness of breath     Historical Provider, MD  clindamycin (CLEOCIN) 150 MG capsule Take 3 capsules (450 mg total) by mouth 3 (three) times daily. 07/15/13   Kristen N Ward, DO  HYDROcodone-acetaminophen (NORCO/VICODIN) 5-325 MG per tablet Take 1 tablet by mouth every 4 (four) hours as needed. 07/15/13   Kristen N Ward, DO  ibuprofen (ADVIL,MOTRIN) 200 MG tablet Take 800 mg by mouth every 6 (six) hours as needed for fever, headache or mild pain.    Historical Provider, MD  promethazine (PHENERGAN) 25 MG tablet Take 1 tablet (25 mg total) by mouth every 6 (six) hours as needed for nausea or vomiting. 07/15/13   Delice Bison Ward, DO   Triage Vitals: BP 137/96  Pulse 72  Temp(Src) 98.6 F (37 C) (Oral)  Resp 18  SpO2 100%  LMP 07/01/2013  Physical Exam  Nursing note and vitals reviewed. Constitutional: She is oriented to person, place, and time. She appears well-developed and well-nourished. No distress.  HENT:  Head: Normocephalic and atraumatic.  Mouth/Throat: Oropharyngeal exudate present.  Bilateral tonsillar erythema and edema with  mild exudate.   Eyes: Conjunctivae and EOM are normal.  Neck: Normal range of motion.  Cardiovascular: Normal rate and regular rhythm.  Exam reveals no gallop and no friction rub.   No murmur heard. Pulmonary/Chest: Effort normal and breath sounds normal. She has no wheezes. She has no rales. She exhibits no tenderness.  Abdominal: Soft. There is no tenderness.  Musculoskeletal: Normal range of motion.  Lymphadenopathy:    She has cervical adenopathy.  Neurological: She is alert and oriented to person, place, and time.  Speech is goal-oriented. Moves limbs without ataxia.   Skin: Skin is warm and dry.  Circular, erythematous and scaling lesions. One on her chest and the other on the back of left thigh. There  is overlying excoriations.   Psychiatric: She has a normal mood and affect. Her behavior is normal.    ED Course  Procedures (including critical care time)  DIAGNOSTIC STUDIES: Oxygen Saturation is 100% on room air, normal by my interpretation.    COORDINATION OF CARE: 12:24 PM- Discussed administering a high dose of Tylenol in the ED and discharging the patient with a prescription for Tramadol, Amoxicillin, and antifungal cream.  The patient agreed to the treatment plan.    Labs Review Labs Reviewed - No data to display  Imaging Review No results found.   EKG Interpretation None      MDM   Final diagnoses:  Strep throat  Dermatophytosis of body   12:35 PM Patient has ring worm and strep throat. Patient will be treated with azithromycin for strep throat due to penicillin allergy. Patient will have lotrimin for ring worm. Vitals stable and patient afebrile.   I personally performed the services described in this documentation, which was scribed in my presence. The recorded information has been reviewed and is accurate.   Alvina Chou, PA-C 08/09/13 1238

## 2013-08-09 NOTE — Discharge Instructions (Signed)
Take azithromycin as directed until gone. Take Tramadol as needed for pain. Use lotrimin cream as directed for your ring worm. Refer to attached documents for more information.

## 2013-08-25 ENCOUNTER — Emergency Department (INDEPENDENT_AMBULATORY_CARE_PROVIDER_SITE_OTHER)
Admission: EM | Admit: 2013-08-25 | Discharge: 2013-08-25 | Disposition: A | Discharge status: Home / Self Care | Payer: Self-pay | Source: Home / Self Care | Attending: Family Medicine | Admitting: Family Medicine

## 2013-08-25 ENCOUNTER — Encounter (HOSPITAL_COMMUNITY): Payer: Self-pay | Admitting: Emergency Medicine

## 2013-08-25 DIAGNOSIS — J00 Acute nasopharyngitis [common cold]: Secondary | ICD-10-CM

## 2013-08-25 MED ORDER — IPRATROPIUM BROMIDE 0.06 % NA SOLN: 2.0000 | Freq: Four times a day (QID) | NASAL | Status: DC

## 2013-08-25 MED ORDER — ALBUTEROL SULFATE HFA 108 (90 BASE) MCG/ACT IN AERS: 1.0000 | INHALATION_SPRAY | Freq: Four times a day (QID) | RESPIRATORY_TRACT | Status: DC | PRN

## 2013-08-25 MED ORDER — BENZONATATE 100 MG PO CAPS: 100.0000 mg | ORAL_CAPSULE | Freq: Three times a day (TID) | ORAL | Status: DC | PRN

## 2013-08-25 NOTE — Discharge Instructions (Signed)

## 2013-08-25 NOTE — ED Provider Notes (Signed)
CSN: [id number]     Arrival date & time 08/25/13  0907 History   First MD Initiated Contact with Patient 08/25/13 1023     No chief complaint on file.  (Consider location/radiation/quality/duration/timing/severity/associated sxs/prior Treatment) HPI Comments: PCP: none Works as Scientist, water quality at [employer] chest is a bit sore from coughing  Patient is a 33 y.o. female presenting with URI. The history is provided by the patient.  URI Presenting symptoms: congestion, cough and rhinorrhea   Presenting symptoms: no ear pain, no facial pain, no fever and no sore throat   Severity:  Moderate Onset quality:  Gradual Duration:  4 days Progression:  Unchanged Chronicity:  New Associated symptoms: wheezing   Associated symptoms: no arthralgias, no headaches, no myalgias, no neck pain, no sinus pain, no sneezing and no swollen glands   Associated symptoms comment:  Has run out of her albuterol inhaler   Past Medical History  Diagnosis Date  . Hypertension     no longer takes meds  . Asthma   . Anxiety   . Depression   . Headache(784.0)     migraines  . Muscle spasm   . Scoliosis    Past Surgical History  Procedure Laterality Date  . Cholecystectomy    . Tubal ligation      pt has had pregnancy after and had  miscarriage   No family history on file. History  Substance Use Topics  . Smoking status: Former Research scientist (life sciences)  . Smokeless tobacco: Not on file  . Alcohol Use: Yes   OB History   Grav Para Term Preterm Abortions TAB SAB Ect Mult Living   4 3 3  0 1 0 1 0 0 3     Review of Systems  Constitutional: Negative for fever and chills.  HENT: Positive for congestion and rhinorrhea. Negative for ear pain, sneezing and sore throat.   Eyes: Negative.   Respiratory: Positive for cough, chest tightness and wheezing. Negative for stridor.   Cardiovascular: Negative for chest pain, palpitations and leg swelling.  Musculoskeletal: Negative for arthralgias, myalgias and neck  pain.  Neurological: Negative for headaches.    Allergies  Penicillins and Percocet  Home Medications   Prior to Admission medications   Medication Sig Start Date End Date Taking? Authorizing Provider  albuterol (PROVENTIL HFA;VENTOLIN HFA) 108 (90 BASE) MCG/ACT inhaler Inhale 2 puffs into the lungs every 6 (six) hours as needed. Patient takes for shortness of breath     Historical Provider, MD  azithromycin (ZITHROMAX Z-PAK) 250 MG tablet Take 1 tablet (250 mg total) by mouth daily. 500mg  PO day 1, then 250mg  PO days 205 08/09/13   Kaitlyn Szekalski, PA-C  clindamycin (CLEOCIN) 150 MG capsule Take 3 capsules (450 mg total) by mouth 3 (three) times daily. 07/15/13   Kristen N Ward, DO  clotrimazole (LOTRIMIN) 1 % cream Apply to affected area 2 times daily 08/09/13   Alvina Chou, PA-C  HYDROcodone-acetaminophen (NORCO/VICODIN) 5-325 MG per tablet Take 1 tablet by mouth every 4 (four) hours as needed. 07/15/13   Kristen N Ward, DO  ibuprofen (ADVIL,MOTRIN) 200 MG tablet Take 800 mg by mouth every 6 (six) hours as needed for fever, headache or mild pain.    Historical Provider, MD  promethazine (PHENERGAN) 25 MG tablet Take 1 tablet (25 mg total) by mouth every 6 (six) hours as needed for nausea or vomiting. 07/15/13   Kristen N Ward, DO  traMADol (ULTRAM) 50 MG tablet Take 1 tablet (50 mg total) by  mouth every 6 (six) hours as needed. 08/09/13   Kaitlyn Szekalski, PA-C   BP 126/92  Pulse 78  Temp(Src) 99 F (37.2 C) (Oral)  Resp 16  SpO2 99%  LMP 08/21/2013 Physical Exam  Nursing note and vitals reviewed. Constitutional: She is oriented to person, place, and time. She appears well-developed and well-nourished. No distress.  HENT:  Head: Normocephalic and atraumatic.  Right Ear: Hearing, tympanic membrane, external ear and ear canal normal.  Left Ear: Hearing, tympanic membrane, external ear and ear canal normal.  Nose: Nose normal.  Mouth/Throat: Uvula is midline, oropharynx is clear  and moist and mucous membranes are normal. No oral lesions. No trismus in the jaw. No uvula swelling.  Eyes: Conjunctivae are normal. Right eye exhibits no discharge. Left eye exhibits no discharge. No scleral icterus.  Neck: Normal range of motion. Neck supple.  Cardiovascular: Normal rate, regular rhythm and normal heart sounds.   Pulmonary/Chest: Effort normal and breath sounds normal. No respiratory distress. She has no wheezes.  Musculoskeletal: Normal range of motion.  Lymphadenopathy:    She has no cervical adenopathy.  Neurological: She is alert and oriented to person, place, and time.  Skin: Skin is warm and dry.  Psychiatric: She has a normal mood and affect. Her behavior is normal.    ED Course  Procedures (including critical care time) Labs Review Labs Reviewed - No data to display  Imaging Review No results found.   MDM   1. Common cold    Common cold: tessalon, atrovent and albuterol as prescribed with additional symptomatic care at home. Follow up prn   [place], [place] 08/25/13 1141

## 2013-08-25 NOTE — ED Notes (Signed)
Pt c/o chest pain onset Sunday  Sx include: chest pain, dyspnea, chills, dry cough, wheezing, HA Denies f/v/n/d Taking OTC cold meds w/no relief Alert w/no signs of acute distress.

## 2013-08-25 NOTE — ED Provider Notes (Signed)
Medical screening examination/treatment/procedure(s) were performed by resident physician or non-physician practitioner and as supervising physician I was immediately available for consultation/collaboration.   Pauline Good MD.   Billy Fischer, MD 08/25/13 [phone]

## 2013-08-25 NOTE — ED Notes (Signed)
Per pt's request, called in albuterol Rx to [facility] [place].

## 2013-08-29 ENCOUNTER — Emergency Department (HOSPITAL_COMMUNITY): Payer: Self-pay

## 2013-08-29 ENCOUNTER — Emergency Department (HOSPITAL_COMMUNITY)
Admission: EM | Admit: 2013-08-29 | Discharge: 2013-08-30 | Disposition: A | Discharge status: Home / Self Care | Payer: Self-pay | Attending: Emergency Medicine | Admitting: Emergency Medicine

## 2013-08-29 ENCOUNTER — Encounter (HOSPITAL_COMMUNITY): Payer: Self-pay | Admitting: Emergency Medicine

## 2013-08-29 DIAGNOSIS — Z8739 Personal history of other diseases of the musculoskeletal system and connective tissue: Secondary | ICD-10-CM | POA: Insufficient documentation

## 2013-08-29 DIAGNOSIS — Z87891 Personal history of nicotine dependence: Secondary | ICD-10-CM | POA: Insufficient documentation

## 2013-08-29 DIAGNOSIS — F411 Generalized anxiety disorder: Secondary | ICD-10-CM | POA: Insufficient documentation

## 2013-08-29 DIAGNOSIS — I10 Essential (primary) hypertension: Secondary | ICD-10-CM | POA: Insufficient documentation

## 2013-08-29 DIAGNOSIS — J069 Acute upper respiratory infection, unspecified: Secondary | ICD-10-CM | POA: Insufficient documentation

## 2013-08-29 DIAGNOSIS — F3289 Other specified depressive episodes: Secondary | ICD-10-CM | POA: Insufficient documentation

## 2013-08-29 DIAGNOSIS — J45909 Unspecified asthma, uncomplicated: Secondary | ICD-10-CM | POA: Insufficient documentation

## 2013-08-29 DIAGNOSIS — R079 Chest pain, unspecified: Secondary | ICD-10-CM | POA: Insufficient documentation

## 2013-08-29 DIAGNOSIS — Z880 Allergy status to penicillin: Secondary | ICD-10-CM | POA: Insufficient documentation

## 2013-08-29 DIAGNOSIS — F329 Major depressive disorder, single episode, unspecified: Secondary | ICD-10-CM | POA: Insufficient documentation

## 2013-08-29 DIAGNOSIS — Z792 Long term (current) use of antibiotics: Secondary | ICD-10-CM | POA: Insufficient documentation

## 2013-08-29 DIAGNOSIS — Z79899 Other long term (current) drug therapy: Secondary | ICD-10-CM | POA: Insufficient documentation

## 2013-08-29 MED ORDER — HYDROCODONE-ACETAMINOPHEN 7.5-325 MG/15ML PO SOLN: 10.0000 mL | Freq: Four times a day (QID) | ORAL | Status: DC | PRN

## 2013-08-29 NOTE — ED Notes (Signed)
Patient states he ws seen at [facility] this past Tuesday and given Tessalon Pearls.  Cough has continued as to where she hurts when she coughs esp on the right side

## 2013-08-29 NOTE — Discharge Instructions (Signed)
Upper Respiratory Infection, Adult An upper respiratory infection (URI) is also sometimes known as the common cold. The upper respiratory tract includes the nose, sinuses, throat, trachea, and bronchi. Bronchi are the airways leading to the lungs. Most people improve within 1 week, but symptoms can last up to 2 weeks. A residual cough may last even longer.  CAUSES Many different viruses can infect the tissues lining the upper respiratory tract. The tissues become irritated and inflamed and often become very moist. Mucus production is also common. A cold is contagious. You can easily spread the virus to others by oral contact. This includes kissing, sharing a glass, coughing, or sneezing. Touching your mouth or nose and then touching a surface, which is then touched by another person, can also spread the virus. SYMPTOMS  Symptoms typically develop 1 to 3 days after you come in contact with a cold virus. Symptoms vary from person to person. They may include:  Runny nose.  Sneezing.  Nasal congestion.  Sinus irritation.  Sore throat.  Loss of voice (laryngitis).  Cough.  Fatigue.  Muscle aches.  Loss of appetite.  Headache.  Low-grade fever. DIAGNOSIS  You might diagnose your own cold based on familiar symptoms, since most people get a cold 2 to 3 times a year. Your caregiver can confirm this based on your exam. Most importantly, your caregiver can check that your symptoms are not due to another disease such as strep throat, sinusitis, pneumonia, asthma, or epiglottitis. Blood tests, throat tests, and X-rays are not necessary to diagnose a common cold, but they may sometimes be helpful in excluding other more serious diseases. Your caregiver will decide if any further tests are required. RISKS AND COMPLICATIONS  You may be at risk for a more severe case of the common cold if you smoke cigarettes, have chronic heart disease (such as heart failure) or lung disease (such as asthma), or if  you have a weakened immune system. The very young and very old are also at risk for more serious infections. Bacterial sinusitis, middle ear infections, and bacterial pneumonia can complicate the common cold. The common cold can worsen asthma and chronic obstructive pulmonary disease (COPD). Sometimes, these complications can require emergency medical care and may be life-threatening. PREVENTION  The best way to protect against getting a cold is to practice good hygiene. Avoid oral or hand contact with people with cold symptoms. Wash your hands often if contact occurs. There is no clear evidence that vitamin C, vitamin E, echinacea, or exercise reduces the chance of developing a cold. However, it is always recommended to get plenty of rest and practice good nutrition. TREATMENT  Treatment is directed at relieving symptoms. There is no cure. Antibiotics are not effective, because the infection is caused by a virus, not by bacteria. Treatment may include:  Increased fluid intake. Sports drinks offer valuable electrolytes, sugars, and fluids.  Breathing heated mist or steam (vaporizer or shower).  Eating chicken soup or other clear broths, and maintaining good nutrition.  Getting plenty of rest.  Using gargles or lozenges for comfort.  Controlling fevers with ibuprofen or acetaminophen as directed by your caregiver.  Increasing usage of your inhaler if you have asthma. Zinc gel and zinc lozenges, taken in the first 24 hours of the common cold, can shorten the duration and lessen the severity of symptoms. Pain medicines may help with fever, muscle aches, and throat pain. A variety of non-prescription medicines are available to treat congestion and runny nose. Your caregiver  can make recommendations and may suggest nasal or lung inhalers for other symptoms.  HOME CARE INSTRUCTIONS   Only take over-the-counter or prescription medicines for pain, discomfort, or fever as directed by your  caregiver.  Use a warm mist humidifier or inhale steam from a shower to increase air moisture. This may keep secretions moist and make it easier to breathe.  Drink enough water and fluids to keep your urine clear or pale yellow.  Rest as needed.  Return to work when your temperature has returned to normal or as your caregiver advises. You may need to stay home longer to avoid infecting others. You can also use a face mask and careful hand washing to prevent spread of the virus. SEEK MEDICAL CARE IF:   After the first few days, you feel you are getting worse rather than better.  You need your caregiver's advice about medicines to control symptoms.  You develop chills, worsening shortness of breath, or brown or red sputum. These may be signs of pneumonia.  You develop yellow or brown nasal discharge or pain in the face, especially when you bend forward. These may be signs of sinusitis.  You develop a fever, swollen neck glands, pain with swallowing, or white areas in the back of your throat. These may be signs of strep throat. SEEK IMMEDIATE MEDICAL CARE IF:   You have a fever.  You develop severe or persistent headache, ear pain, sinus pain, or chest pain.  You develop wheezing, a prolonged cough, cough up blood, or have a change in your usual mucus (if you have chronic lung disease).  You develop sore muscles or a stiff neck. Document Released: 10/03/2000 Document Revised: 07/02/2011 Document Reviewed: 08/11/2010 [facility] Patient Information 2014 [place], [place].   Emergency Department Resource Guide 1) Find a Doctor and Pay Out of Pocket Although you won't have to find out who is covered by your insurance plan, it is a good idea to ask around and get recommendations. You will then need to call the office and see if the doctor you have chosen will accept you as a new patient and what types of options they offer for patients who are self-pay. Some doctors offer discounts or will set  up payment plans for their patients who do not have insurance, but you will need to ask so you aren't surprised when you get to your appointment.  2) Contact Your Local Health Department Not all health departments have doctors that can see patients for sick visits, but many do, so it is worth a call to see if yours does. If you don't know where your local health department is, you can check in your phone book. The CDC also has a tool to help you locate your state's health department, and many state websites also have listings of all of their local health departments.  3) Find a [facility] If your illness is not likely to be very severe or complicated, you may want to try a walk in clinic. These are popping up all over the country in pharmacies, drugstores, and shopping centers. They're usually staffed by nurse practitioners or physician assistants that have been trained to treat common illnesses and complaints. They're usually fairly quick and inexpensive. However, if you have serious medical issues or chronic medical problems, these are probably not your best option.  No Primary Care Doctor: - Call Health Connect at  [phone] - they can help you locate a primary care doctor that  accepts your insurance, provides  certain services, etc. - Physician Referral Service- [phone]  Chronic Pain Problems: Organization         Address  Phone   Notes  [facility]  [phone] Patients need to be referred by their primary care doctor.   Medication Assistance: Organization         Address  Phone   Notes  [facility] Medication [facility] [address] [place], [place] [address] [phone] --Must be a resident of [facility] -- Must have NO insurance coverage whatsoever (no Medicaid/ Medicare, etc.) -- The pt. MUST have a primary care doctor that directs their care regularly and follows them in the community   MedAssist  [phone]    [employer]  [phone]    Agencies that provide inexpensive medical care: Organization         Address  Phone   Notes  [place]  [phone]   [facility]    [phone]   [facility] [place], [place] [address] [phone]   [place] [address], [place] [phone]   Planned Parenthood    [phone]   [facility]    [phone]   [place] and [address], [place] Phone:  [phone], Fax:  [phone] Hours of Operation:  9 am - 6 pm, M-F.  Also accepts Medicaid/Medicare and self-pay.  [place] for [place], [address], [place] Phone: [phone], Fax: [phone]. Hours of Operation:  8:30 am - 5:30 pm, M-F.  Also accepts Medicaid and self-pay.  [facility] [address], [place] Phone: [phone]   [place], [place], [place] [phone], Ext. [phone] Mondays & Thursdays: 7-9 AM.  First 15 patients are seen on a first come, first serve basis.    [place] Providers:  Organization         Address  Phone   Notes  [facility] [address], [place] [phone] Also accepts self-pay patients.  [facility] [address] [place], [place]  [phone]   [place], [address], [place] [phone]   [facility] [address], [place] [phone]   Lucianne Lei [address], [place]   [phone] Only accepts [place] Access [place] patients after they have their name applied to their card.   Self-Pay (no insurance) in [facility]:  Organization         Address  Phone   Notes  Sickle Cell Patients, [facility] [place] [phone]   [facility] [place] [phone]   [facility] [place]  [place], [place],  [phone]   [facility]/Dr. Osei-Bonsu  [address] [place] or [address], [place] [phone] Phone number for both [place] and [place] locations is the same.  [facility]gent [facility] and [facility] [address] [place] [phone]   [facility] [address], [place] or [address] [phone]   [facility] [address]  [address], [place] [phone], phone; [phone], fax Sees patients 1st and 3rd Saturday of every month.  Must not qualify for public or private insurance (i.e. Medicaid, Medicare, [place] Health Choice, Veterans' Benefits)  Household income should be no more than 200% of the poverty level The clinic cannot treat you if you are pregnant or think you are pregnant  Sexually transmitted diseases are not treated at the clinic.    Dental Care: Organization         Address  Phone  Notes  [facility] Department of [facility] [phone] Accepts children up to age 56 who are enrolled in [place] or [place]; pregnant women with a Medicaid card; and children who have applied for Medicaid or [place] Health Choice, but were declined, whose parents can pay a reduced fee at time of service.  [facility] Department of [facility]  [address], [place] [phone] Accepts children up to age 52 who are enrolled in [place] or [place]; pregnant women with a Medicaid card; and children who have applied for Medicaid or [place] Health Choice, but were declined, whose parents can pay a reduced fee at time of service.  [facility] Adult [facility] Access PROGRAM  [place] [phone] Patients are seen by appointment only. Walk-ins are not accepted. [place] will see patients 15 years of age and older. Monday - Tuesday (8am-5pm) Most Wednesdays (8:30-5pm) $30 per visit, cash only  [facility] Adult Dental Access PROGRAM  [address], [facility] [phone] Patients are seen by appointment only. Walk-ins are not accepted. [place] will see patients 40 years of age and older. One Wednesday Evening (Monthly: Volunteer Based).  $30 per visit, cash only  [place]  [phone] for adults; Children under age 27, call Graduate Pediatric Dentistry at [phone]. Children aged 28-14, please call [phone] to request a pediatric application.  Dental services are provided in all areas of dental care including fillings, crowns and bridges, complete and partial dentures, implants, gum treatment, root canals, and extractions. Preventive care is also provided. Treatment is provided to both adults and children. Patients are selected via a lottery and there is often a waiting list.   [facility] [address] [place]  [phone] [email or web address]   [facility] [address] [place], [place] [phone], Ext. [phone] Second and Fourth Thursday of each month, opens at 6:30 AM; Clinic ends at 9 AM.  Patients are seen on a first-come first-served basis, and a limited number are seen during each clinic.   [facility]  [address] Hillard Danker [place], [place] [phone]   Eligibility Requirements You must have lived in [place], [place], or [place] counties for at least the last three months.   You cannot be eligible for state or federal sponsored [employer], including [employer], [place], or [employer].   You generally cannot be eligible for healthcare insurance through your employer.    How to apply: Eligibility screenings are held every Tuesday and Wednesday afternoon  from 1:00 pm until 4:00 pm. You do not need an appointment for the interview!  [facility] [address], [place], [place]   [facility]  [place]  [place]  [phone]  Behavioral Health Resources in the Community: Intensive Outpatient Programs Organization         Address  Phone  Notes  [place]. [address] [place], [place] [phone]   [facility] [address], [place], [place]   ADS: Alcohol & Drug Svcs [address], [place], [place]   [place] [address]  [place], [place] or [phone]   Substance Abuse Resources Organization         Address  Phone  Notes  Alcohol and Drug Services  [phone]   [place]  [phone]   [facility] [place]   Chinita Pester  [phone]   Residential & Outpatient Substance Abuse Program  [phone]   Psychological Services Organization         Address  Phone  Notes  [facility]  [place]  [phone]   [place] [address] [place] or [phone]    Mobile Crisis Teams Organization         Address  Phone  Notes  [facility], Mobile Crisis Care Unit  [phone]   [facility] Psychotherapeutic Services  [address] [place], [place]   [place] [address], [place] [phone]    Self-Help/Support Groups Organization         Address  Phone             Notes  [place]. of [place] - variety of support groups  Bear Dance Call for more information  Narcotics Anonymous (NA), [facility] [address], [employer] [place]  2 meetings at this location   Materials engineer         Address  Phone  Notes  [facility] [place],    [place]  [phone]   [facility]  [address], [place] [phone], [place], [place]   [place], [place] [phone] Admissions: 8am-3pm M-F  Incentives Substance [place] [address],    [place], [place] [phone]   [facility] [address] [place], [place], [place]   [facility] [address]  [place], [place]   [facility]ight Programs - Intensive Outpatient [address], [place], [address] [place]   [facility] ([place].,  [place], [place] [phone] or [phone]   [facility]idential Treatment Services (RTS) [address], [place], [place] Accepts Medicaid  [facility] [place] [address],  [place] [phone] Substance Abuse/Addiction Treatment   [facility] Organization         Address  Phone  Notes  [facility]  [phone]   Domenic Schwab, PhD [address] Arlis Porta [place], [place]   [phone] or [phone]   [place] [address] [place], [place] [phone]   [facility] [address], [place], [place] [phone] Insurance/Medicaid/sponsorship through [employer] and Families [address], [place]                                    [place], [place] [phone] [place],  [place] [phone]    Dr. Adele Schilder  [phone]   [facility] Dept. 1) [address], [place] 2) [place] 3)  [address], [place] [phone]  [phone]   [place] [phone] or [phone] (After Hours)

## 2013-08-29 NOTE — ED Notes (Signed)
Pt seen at urgent care last Tuesday for onset cough x 2 weeks.  Cough interfering with activities and sleep.  Onset Friday afternoon right sided chest/breast pain when coughing.  Onset this morning sore throat.  An inhaler was to be called in by urgent care last week but inhaler never made it to pharmacy.  Pt has been taking Tessalon perles for cough.

## 2013-08-29 NOTE — ED Provider Notes (Signed)
CSN: [id number]     Arrival date & time 08/29/13  2043 History  This chart was scribed for non-physician practitioner, Domenic Moras, PA-C working with Osvaldo Shipper, MD by Frederich Balding, ED scribe. This patient was seen in room TR11C/TR11C and the patient's care was started at 10:59 PM.   Chief Complaint  Patient presents with  . Cough  . Rib Pain    The history is provided by the patient. No language interpreter was used.   HPI Comments: Dana Hughes is a 33 y.o. female with history of asthma who presents to the Emergency Department complaining of productive cough that started 4 days ago. Symptoms started with rhinorrhea and sore throat. Pt was evaluated at an urgent care 4 days ago, diagnosed with a cold and given tessalon with no relief. She states she has mild, sharp right rib pain with cough. The last time she used her inhaler was one month ago. Denies sneezing, hemoptysis, nausea, emesis, diarrhea, leg swelling, calf pain, nipple discharge. Denies history of blood clots. Denies recent surgery or long distance travel. Pt does not take birth control pills. Denies history of complications with her asthma.   Past Medical History  Diagnosis Date  . Hypertension     no longer takes meds  . Asthma   . Anxiety   . Depression   . Headache(784.0)     migraines  . Muscle spasm   . Scoliosis    Past Surgical History  Procedure Laterality Date  . Cholecystectomy    . Tubal ligation      pt has had pregnancy after and had  miscarriage   History reviewed. No pertinent family history. History  Substance Use Topics  . Smoking status: Former Research scientist (life sciences)  . Smokeless tobacco: Not on file  . Alcohol Use: Yes   OB History   Grav Para Term Preterm Abortions TAB SAB Ect Mult Living   4 3 3  0 1 0 1 0 0 3     Review of Systems  HENT: Positive for rhinorrhea and sore throat. Negative for sneezing.   Respiratory: Positive for cough.   Cardiovascular: Negative for leg swelling.   Gastrointestinal: Negative for nausea, vomiting and diarrhea.  Musculoskeletal:       Rib pain.  All other systems reviewed and are negative.  Allergies  Penicillins and Percocet  Home Medications   Prior to Admission medications   Medication Sig Start Date End Date Taking? Authorizing Provider  albuterol (PROVENTIL HFA;VENTOLIN HFA) 108 (90 BASE) MCG/ACT inhaler Inhale 2 puffs into the lungs every 6 (six) hours as needed. Patient takes for shortness of breath     Historical Provider, MD  albuterol (PROVENTIL HFA;VENTOLIN HFA) 108 (90 BASE) MCG/ACT inhaler Inhale 1-2 puffs into the lungs every 6 (six) hours as needed for wheezing or shortness of breath. 08/25/13   Lahoma Rocker, PA  azithromycin (ZITHROMAX Z-PAK) 250 MG tablet Take 1 tablet (250 mg total) by mouth daily. 500mg  PO day 1, then 250mg  PO days 205 08/09/13   Kaitlyn Szekalski, PA-C  benzonatate (TESSALON) 100 MG capsule Take 1 capsule (100 mg total) by mouth 3 (three) times daily as needed for cough. 08/25/13   Lahoma Rocker, PA  clindamycin (CLEOCIN) 150 MG capsule Take 3 capsules (450 mg total) by mouth 3 (three) times daily. 07/15/13   Kristen N Ward, DO  clotrimazole (LOTRIMIN) 1 % cream Apply to affected area 2 times daily 08/09/13   Alvina Chou, PA-C  HYDROcodone-acetaminophen (NORCO/VICODIN)  5-325 MG per tablet Take 1 tablet by mouth every 4 (four) hours as needed. 07/15/13   Kristen N Ward, DO  ibuprofen (ADVIL,MOTRIN) 200 MG tablet Take 800 mg by mouth every 6 (six) hours as needed for fever, headache or mild pain.    Historical Provider, MD  ipratropium (ATROVENT) 0.06 % nasal spray Place 2 sprays into both nostrils 4 (four) times daily. 08/25/13   Lahoma Rocker, PA  promethazine (PHENERGAN) 25 MG tablet Take 1 tablet (25 mg total) by mouth every 6 (six) hours as needed for nausea or vomiting. 07/15/13   Delice Bison Ward, DO  traMADol (ULTRAM) 50 MG tablet Take 1 tablet (50 mg total) by mouth every 6 (six)  hours as needed. 08/09/13   Kaitlyn Szekalski, PA-C   BP 128/91  Pulse 88  Temp(Src) 99.1 F (37.3 C) (Oral)  Resp 20  Ht 5\' 2"  (1.575 m)  Wt 200 lb 9.6 oz (90.992 kg)  BMI 36.68 kg/m2  SpO2 96%  LMP 08/21/2013  Physical Exam  Nursing note and vitals reviewed. Constitutional: She is oriented to person, place, and time. She appears well-developed and well-nourished. No distress.  HENT:  Head: Normocephalic and atraumatic.  Mouth/Throat: Uvula is midline. No trismus in the jaw. No oropharyngeal exudate or posterior oropharyngeal edema.  No tonsillar enlargement or exudate. No trismus.   Eyes: EOM are normal.  Neck: Neck supple. No tracheal deviation present.  Cardiovascular: Normal rate, regular rhythm and normal heart sounds.   Pulmonary/Chest: Effort normal and breath sounds normal. No respiratory distress. She has no wheezes. She has no rales.  Musculoskeletal: Normal range of motion.  Lymphadenopathy:    She has no cervical adenopathy.  Neurological: She is alert and oriented to person, place, and time.  Skin: Skin is warm and dry.  Psychiatric: She has a normal mood and affect. Her behavior is normal.    ED Course  Procedures (including critical care time)  DIAGNOSTIC STUDIES: Oxygen Saturation is 96% on RA, normal by my interpretation.    COORDINATION OF CARE: 11:03 PM-Discussed treatment plan which includes chest xray with pt at bedside and pt agreed to plan.   11:57 PM Xray neg, likely URI, reassurance given.   Labs Review Labs Reviewed - No data to display  Imaging Review Dg Chest 2 View  08/29/2013   CLINICAL DATA:  cough  EXAM: CHEST  2 VIEW  COMPARISON:  CT ABD-PELV W/O CM dated 11/23/2012; DG CHEST 2 VIEW dated 10/02/2009  FINDINGS: Cardiopericardial silhouette within normal limits. Mediastinal contours normal. Trachea midline. No airspace disease or effusion. Cholecystectomy clips are present in the right upper quadrant.  IMPRESSION: No active cardiopulmonary  disease.   Electronically Signed   By: Dereck Ligas M.D.   On: 08/29/2013 23:09     EKG Interpretation None      MDM   Final diagnoses:  URI (upper respiratory infection)    BP 128/91  Pulse 88  Temp(Src) 99.1 F (37.3 C) (Oral)  Resp 20  Ht 5\' 2"  (1.575 m)  Wt 200 lb 9.6 oz (90.992 kg)  BMI 36.68 kg/m2  SpO2 96%  LMP 08/21/2013  I have reviewed nursing notes and vital signs. I personally reviewed the imaging tests through PACS system  I reviewed available ER/hospitalization records thought the EMR   I personally performed the services described in this documentation, which was scribed in my presence. The recorded information has been reviewed and is accurate.  Domenic Moras, PA-C 08/29/13 2358

## 2013-08-30 NOTE — ED Provider Notes (Signed)
Medical screening examination/treatment/procedure(s) were performed by non-physician practitioner and as supervising physician I was immediately available for consultation/collaboration.   EKG Interpretation None        Osvaldo Shipper, MD 08/30/13 0001

## 2013-09-15 ENCOUNTER — Inpatient Hospital Stay (HOSPITAL_COMMUNITY)
Admission: AD | Admit: 2013-09-15 | Discharge: 2013-09-15 | Disposition: A | Discharge status: Home / Self Care | Payer: Self-pay | Source: Ambulatory Visit | Attending: Obstetrics & Gynecology | Admitting: Obstetrics & Gynecology

## 2013-09-15 ENCOUNTER — Encounter (HOSPITAL_COMMUNITY): Payer: Self-pay | Admitting: *Deleted

## 2013-09-15 DIAGNOSIS — N760 Acute vaginitis: Secondary | ICD-10-CM | POA: Insufficient documentation

## 2013-09-15 DIAGNOSIS — A499 Bacterial infection, unspecified: Secondary | ICD-10-CM | POA: Insufficient documentation

## 2013-09-15 DIAGNOSIS — Z87891 Personal history of nicotine dependence: Secondary | ICD-10-CM | POA: Insufficient documentation

## 2013-09-15 DIAGNOSIS — R1031 Right lower quadrant pain: Secondary | ICD-10-CM | POA: Insufficient documentation

## 2013-09-15 DIAGNOSIS — B9689 Other specified bacterial agents as the cause of diseases classified elsewhere: Secondary | ICD-10-CM | POA: Insufficient documentation

## 2013-09-15 DIAGNOSIS — B3731 Acute candidiasis of vulva and vagina: Secondary | ICD-10-CM | POA: Insufficient documentation

## 2013-09-15 DIAGNOSIS — B373 Candidiasis of vulva and vagina: Secondary | ICD-10-CM

## 2013-09-15 LAB — WET PREP, GENITAL: Trich, Wet Prep: NONE SEEN

## 2013-09-15 LAB — URINALYSIS, ROUTINE W REFLEX MICROSCOPIC
Bilirubin Urine: NEGATIVE
GLUCOSE, UA: NEGATIVE mg/dL
HGB URINE DIPSTICK: NEGATIVE
Ketones, ur: NEGATIVE mg/dL
Leukocytes, UA: NEGATIVE
Nitrite: NEGATIVE
Protein, ur: NEGATIVE mg/dL
SPECIFIC GRAVITY, URINE: 1.02 (ref 1.005–1.030)
Urobilinogen, UA: 0.2 mg/dL (ref 0.0–1.0)
pH: 7 (ref 5.0–8.0)

## 2013-09-15 LAB — CBC
HCT: 37.7 % (ref 36.0–46.0)
Hemoglobin: 12.5 g/dL (ref 12.0–15.0)
MCH: 30.1 pg (ref 26.0–34.0)
MCHC: 33.2 g/dL (ref 30.0–36.0)
MCV: 90.8 fL (ref 78.0–100.0)
PLATELETS: 358 10*3/uL (ref 150–400)
RBC: 4.15 MIL/uL (ref 3.87–5.11)
RDW: 13.2 % (ref 11.5–15.5)
WBC: 9.9 10*3/uL (ref 4.0–10.5)

## 2013-09-15 LAB — COMPREHENSIVE METABOLIC PANEL
ALBUMIN: 3.8 g/dL (ref 3.5–5.2)
ALT: 16 U/L (ref 0–35)
AST: 17 U/L (ref 0–37)
Alkaline Phosphatase: 48 U/L (ref 39–117)
BUN: 7 mg/dL (ref 6–23)
CALCIUM: 9.3 mg/dL (ref 8.4–10.5)
CO2: 25 mEq/L (ref 19–32)
CREATININE: 0.73 mg/dL (ref 0.50–1.10)
Chloride: 102 mEq/L (ref 96–112)
GFR calc Af Amer: >90 mL/min (ref >90)
GFR calc non Af Amer: >90 mL/min (ref >90)
Glucose, Bld: 83 mg/dL (ref 70–99)
Potassium: 4.2 mEq/L (ref 3.7–5.3)
Sodium: 139 mEq/L (ref 137–147)
Total Bilirubin: <0.2 mg/dL — ABNORMAL LOW (ref 0.3–1.2)
Total Protein: 7.5 g/dL (ref 6.0–8.3)

## 2013-09-15 LAB — POCT PREGNANCY, URINE: Preg Test, Ur: NEGATIVE

## 2013-09-15 MED ORDER — FLUCONAZOLE 150 MG PO TABS: 150.0000 mg | ORAL_TABLET | Freq: Every day | ORAL | Status: DC

## 2013-09-15 MED ORDER — METRONIDAZOLE 500 MG PO TABS: 500.0000 mg | ORAL_TABLET | Freq: Two times a day (BID) | ORAL | Status: DC

## 2013-09-15 MED ORDER — IBUPROFEN 600 MG PO TABS: 600.0000 mg | ORAL_TABLET | Freq: Four times a day (QID) | ORAL | Status: DC | PRN

## 2013-09-15 MED ORDER — KETOROLAC TROMETHAMINE 60 MG/2ML IM SOLN
60.0000 mg | Freq: Once | INTRAMUSCULAR | Status: AC
  Administered 2013-09-15: 60 mg via INTRAMUSCULAR
  Filled 2013-09-15: qty 2

## 2013-09-15 MED ORDER — FLUCONAZOLE 150 MG PO TABS
150.0000 mg | ORAL_TABLET | Freq: Every day | ORAL | Status: DC
  Administered 2013-09-15: 150 mg via ORAL
  Filled 2013-09-15: qty 1

## 2013-09-15 NOTE — Discharge Instructions (Signed)
Bacterial Vaginosis Bacterial vaginosis is an infection of the vagina. It happens when too many of certain germs (bacteria) grow in the vagina. HOME CARE  Take your medicine as told by your doctor.  Finish your medicine even if you start to feel better.  Do not have sex until you finish your medicine and are better.  Tell your sex partner that you have an infection. They should see their doctor for treatment.  Practice safe sex. Use condoms. Have only one sex partner. GET HELP IF:  You are not getting better after 3 days of treatment.  You have more grey fluid (discharge) coming from your vagina than before.  You have more pain than before.  You have a fever. MAKE SURE YOU:   Understand these instructions.  Will watch your condition.  Will get help right away if you are not doing well or get worse. Document Released: 01/17/2008 Document Revised: 01/28/2013 Document Reviewed: 11/19/2012 [employer] Patient Information 2014 [employer].  

## 2013-09-15 NOTE — MAU Note (Addendum)
Has "knot" in R lower abd (points to groin area) that is sore to touch. Present for 60mos. Noticed it 2 yrs ago and went away. Also having vag d/c for few days. D/C is clear with white chalky substance. Denies itching or burning. States pain R groin worse with movement. Also states has spotting sometimes between periods

## 2013-09-15 NOTE — MAU Note (Signed)
Pt presents to MAU for R sided abdominal pain for 2 months.Pain is usually relieved with ibuprofen per patient.She also reports that she can "feel a lump near the ovary". Pt also states that she is having some discharge, denies itching.

## 2013-09-15 NOTE — Progress Notes (Signed)
Jennifer Rasch NP in earlier to discuss test results and d/c plan. Written and verbal d/c instructions given and understanding voiced. 

## 2013-09-15 NOTE — MAU Provider Note (Signed)
History     CSN: [id number]  Arrival date and time: 09/15/13 1102   First Provider Initiated Contact with Patient 09/15/13 1142      Chief Complaint  Patient presents with  . Abdominal Pain  . Vaginal Discharge   HPI Dana Hughes is a 33 yo .g african [place] female [phone] presents to the MAU with right lower abdominal pain and vaginal discharge.   Abdominal pain: Pt states that she began having right sided stomach pain about 2 weeks ago which has become intolerable over the past few days. Pt reports her pain as a 10/10 at its worst and a 6/10 here in the MAU. Pt says she feels a knot in the right side of her abdomen that seems like it has grown bigger. Pt has tried ibuprofen and tylenol with no relief. Her lmp ended 08/24/13 and she is in a current monogamous relationship of 3 months. No history of ovarian cysts or uterine fibroids.   Vaginal discharge: Pt noticed a clear/white vaginal discharge two days. She was laying in bed yesterday 09/14/13 and felt a "gush" of fluid that reminded her of water breaking during labor. Pt then noticed an undisclosed amount of clear fluid with a little bit of white fluid mixed in on her bedding.   Pt denies fever, n/v/d, dysuria, hematuria, chills, night sweats.    OB History   Grav Para Term Preterm Abortions TAB SAB Ect Mult Living   _0 0 1 0 1 0 0 3      Past Medical History  Diagnosis Date  . Hypertension     no longer takes meds  . Asthma   . Anxiety   . Depression   . Headache(784.0)     migraines  . Muscle spasm   . Scoliosis     Past Surgical History  Procedure Laterality Date  . Cholecystectomy    . Tubal ligation      pt has had pregnancy after and had  miscarriage    Family History  Problem Relation Age of Onset  . Cancer Father   . Hypertension Maternal Aunt   . Cancer Paternal Grandmother     History  Substance Use Topics  . Smoking status: Former Research scientist (life sciences)  . Smokeless tobacco: Not on file  . Alcohol  Use: No    Allergies:  Allergies  Allergen Reactions  . Penicillins Itching  . Percocet [Oxycodone-Acetaminophen] Itching    Prescriptions prior to admission  Medication Sig Dispense Refill  . albuterol (PROVENTIL HFA;VENTOLIN HFA) 108 (90 BASE) MCG/ACT inhaler Inhale 1-2 puffs into the lungs every 6 (six) hours as needed for wheezing or shortness of breath.  1 Inhaler  0  . ibuprofen (ADVIL,MOTRIN) 200 MG tablet Take 800 mg by mouth every 6 (six) hours as needed for fever, headache or mild pain.        Results for orders placed during the hospital encounter of 09/15/13 (from the past 48 hour(s))  URINALYSIS, ROUTINE W REFLEX MICROSCOPIC     Status: None   Collection Time    09/15/13 11:10 AM      Result Value Ref Range   Color, Urine YELLOW  YELLOW   APPearance CLEAR  CLEAR   Specific Gravity, Urine 1.020  1.005 - 1.030   pH 7.0  5.0 - 8.0   Glucose, UA NEGATIVE  NEGATIVE mg/dL   Hgb urine dipstick NEGATIVE  NEGATIVE   Bilirubin Urine NEGATIVE  NEGATIVE   Ketones, ur NEGATIVE  NEGATIVE  mg/dL   Protein, ur NEGATIVE  NEGATIVE mg/dL   Urobilinogen, UA 0.2  0.0 - 1.0 mg/dL   Nitrite NEGATIVE  NEGATIVE   Leukocytes, UA NEGATIVE  NEGATIVE   Comment: MICROSCOPIC NOT DONE ON URINES WITH NEGATIVE PROTEIN, BLOOD, LEUKOCYTES, NITRITE, OR GLUCOSE <1000 mg/dL.  POCT PREGNANCY, URINE     Status: None   Collection Time    09/15/13 11:13 AM      Result Value Ref Range   Preg Test, Ur NEGATIVE  NEGATIVE   Comment:            THE SENSITIVITY OF THIS     METHODOLOGY IS >24 mIU/mL  CBC     Status: None   Collection Time    09/15/13 12:18 PM      Result Value Ref Range   WBC 9.9  4.0 - 10.5 K/uL   RBC 4.15  3.87 - 5.11 MIL/uL   Hemoglobin 12.5  12.0 - 15.0 g/dL   HCT 37.7  36.0 - 46.0 %   MCV 90.8  78.0 - 100.0 fL   MCH 30.1  26.0 - 34.0 pg   MCHC 33.2  30.0 - 36.0 g/dL   RDW 13.2  11.5 - 15.5 %   Platelets 358  150 - 400 K/uL  COMPREHENSIVE METABOLIC PANEL     Status: Abnormal    Collection Time    09/15/13 12:18 PM      Result Value Ref Range   Sodium 139  137 - 147 mEq/L   Potassium 4.2  3.7 - 5.3 mEq/L   Chloride 102  96 - 112 mEq/L   CO2 25  19 - 32 mEq/L   Glucose, Bld 83  70 - 99 mg/dL   BUN 7  6 - 23 mg/dL   Creatinine, Ser 0.73  0.50 - 1.10 mg/dL   Calcium 9.3  8.4 - 10.5 mg/dL   Total Protein 7.5  6.0 - 8.3 g/dL   Albumin 3.8  3.5 - 5.2 g/dL   AST 17  0 - 37 U/L   ALT 16  0 - 35 U/L   Alkaline Phosphatase 48  39 - 117 U/L   Total Bilirubin <0.2 (*) 0.3 - 1.2 mg/dL   GFR calc non Af Amer >90  >90 mL/min   GFR calc Af Amer >90  >90 mL/min   Comment: (NOTE)     The eGFR has been calculated using the CKD EPI equation.     This calculation has not been validated in all clinical situations.     eGFR's persistently <90 mL/min signify possible Chronic Kidney     Disease.  WET PREP, GENITAL     Status: Abnormal   Collection Time    09/15/13 12:41 PM      Result Value Ref Range   Yeast Wet Prep HPF POC MODERATE (*) NONE SEEN   Trich, Wet Prep NONE SEEN  NONE SEEN   Clue Cells Wet Prep HPF POC MODERATE (*) NONE SEEN   WBC, Wet Prep HPF POC FEW (*) NONE SEEN   Comment: FEW BACTERIA SEEN     Review of Systems  Constitutional: Negative for fever, chills, weight loss, malaise/fatigue and diaphoresis.  Gastrointestinal: Positive for abdominal pain. Negative for nausea, vomiting, diarrhea and constipation.  Genitourinary: Negative for dysuria and hematuria.  Neurological: Negative for weakness.  All other systems reviewed and are negative.  Physical Exam   Blood pressure 126/81, pulse 68, temperature 98.4 F (36.9 C), resp. rate 20,  height _0  (1.575 m), weight 90.992 kg (200 lb 9.6 oz), last menstrual period 08/19/2013.  Physical Exam  Constitutional: She is oriented to person, place, and time.  HENT:  Head: Normocephalic and atraumatic.  Respiratory: No respiratory distress.  GI: She exhibits no distension and no mass. There is no  hepatosplenomegaly. There is tenderness in the right lower quadrant. There is no rebound, no CVA tenderness and negative Murphy's sign.  Genitourinary: Vaginal discharge found.  Musculoskeletal: She exhibits no edema and no tenderness.  Neurological: She is alert and oriented to person, place, and time.   Speculum exam: Vagina - Moderate amount of creamy discharge, no odor Cervix - No contact bleeding Bimanual exam: Cervix closed, no CMT,  Uterus non tender, normal size Tender right adnexa, no masses bilaterally GC/Chlam, wet prep done Chaperone present for exam.   MAU Course  Procedures None  MDM Urine pregnancy negative UA Pelvic G/C Wet prep Toradol Pt states her pain has improved significantly. Pt rates her pain 2/10 Diflucan 150 mg given in MAU   Assessment and Plan   A:  1. Yeast vaginitis   2. BV (bacterial vaginosis)    P:  Discharge home in stable condition RX: Diflucan see dosing        Flagyl; no alcohol        Ibuprofen 600 mg Return to MAU as needed if symptoms worsen  Melvern Banker 09/15/2013, 11:53 AM   Evaluation and management procedures were performed by the PA Student under my supervision and collaboration. I have reviewed the note and chart, and I agree with the management and plan.  Darrelyn Hillock Mercedees Convery, NP 09/15/2013 5:01 PM

## 2013-09-16 LAB — GC/CHLAMYDIA PROBE AMP
CT PROBE, AMP APTIMA: NEGATIVE
GC Probe RNA: NEGATIVE

## 2013-09-16 NOTE — MAU Provider Note (Signed)
Attestation of Attending Supervision of Advanced Practitioner (CNM/NP): Evaluation and management procedures were performed by the Advanced Practitioner under my supervision and collaboration. I have reviewed the Advanced Practitioner's note and chart, and I agree with the management and plan.  Guss Bunde 9:50 AM

## 2013-12-19 ENCOUNTER — Inpatient Hospital Stay (HOSPITAL_COMMUNITY)
Admission: AD | Admit: 2013-12-19 | Discharge: 2013-12-19 | Disposition: A | Discharge status: Home / Self Care | Payer: Self-pay | Source: Ambulatory Visit | Attending: Obstetrics & Gynecology | Admitting: Obstetrics & Gynecology

## 2013-12-19 ENCOUNTER — Encounter (HOSPITAL_COMMUNITY): Payer: Self-pay

## 2013-12-19 DIAGNOSIS — B9689 Other specified bacterial agents as the cause of diseases classified elsewhere: Secondary | ICD-10-CM | POA: Insufficient documentation

## 2013-12-19 DIAGNOSIS — Z87891 Personal history of nicotine dependence: Secondary | ICD-10-CM | POA: Insufficient documentation

## 2013-12-19 DIAGNOSIS — G43009 Migraine without aura, not intractable, without status migrainosus: Secondary | ICD-10-CM

## 2013-12-19 DIAGNOSIS — N760 Acute vaginitis: Secondary | ICD-10-CM | POA: Insufficient documentation

## 2013-12-19 DIAGNOSIS — G43909 Migraine, unspecified, not intractable, without status migrainosus: Secondary | ICD-10-CM | POA: Insufficient documentation

## 2013-12-19 DIAGNOSIS — A499 Bacterial infection, unspecified: Secondary | ICD-10-CM | POA: Insufficient documentation

## 2013-12-19 LAB — GLUCOSE, CAPILLARY: Glucose-Capillary: 85 mg/dL (ref 70–99)

## 2013-12-19 LAB — URINALYSIS, ROUTINE W REFLEX MICROSCOPIC
Bilirubin Urine: NEGATIVE
GLUCOSE, UA: NEGATIVE mg/dL
Ketones, ur: NEGATIVE mg/dL
Leukocytes, UA: NEGATIVE
Nitrite: NEGATIVE
Protein, ur: NEGATIVE mg/dL
SPECIFIC GRAVITY, URINE: 1.02 (ref 1.005–1.030)
UROBILINOGEN UA: 0.2 mg/dL (ref 0.0–1.0)
pH: 6 (ref 5.0–8.0)

## 2013-12-19 LAB — WET PREP, GENITAL
Trich, Wet Prep: NONE SEEN
YEAST WET PREP: NONE SEEN

## 2013-12-19 LAB — URINE MICROSCOPIC-ADD ON

## 2013-12-19 LAB — POCT PREGNANCY, URINE: PREG TEST UR: NEGATIVE

## 2013-12-19 MED ORDER — METRONIDAZOLE 500 MG PO TABS: 500.0000 mg | ORAL_TABLET | Freq: Two times a day (BID) | ORAL | Status: DC

## 2013-12-19 MED ORDER — KETOROLAC TROMETHAMINE 60 MG/2ML IM SOLN
60.0000 mg | Freq: Once | INTRAMUSCULAR | Status: AC
  Administered 2013-12-19: 60 mg via INTRAMUSCULAR
  Filled 2013-12-19: qty 2

## 2013-12-19 MED ORDER — ALBUTEROL SULFATE HFA 108 (90 BASE) MCG/ACT IN AERS: 1.0000 | INHALATION_SPRAY | Freq: Four times a day (QID) | RESPIRATORY_TRACT | Status: DC | PRN

## 2013-12-19 MED ORDER — BUTALBITAL-APAP-CAFFEINE 50-325-40 MG PO TABS: 1.0000 | ORAL_TABLET | Freq: Four times a day (QID) | ORAL | Status: DC | PRN

## 2013-12-19 MED ORDER — BUTALBITAL-APAP-CAFFEINE 50-325-40 MG PO TABS
2.0000 | ORAL_TABLET | Freq: Once | ORAL | Status: AC
  Administered 2013-12-19: 2 via ORAL
  Filled 2013-12-19: qty 2

## 2013-12-19 NOTE — MAU Note (Signed)
Pt. presents today reporting pain in ovaries, lightheadedness, and hot and cold flashes. She says she has been getting migraines and progressively more blurry vision for the past week to the extent she has a hard time seeing her phone screen.

## 2013-12-19 NOTE — MAU Provider Note (Signed)
History     CSN: [id number]  Arrival date and time: 12/19/13 1245   First Provider Initiated Contact with Patient 12/19/13 1339      Chief Complaint  Patient presents with  . Blurred Vision   HPI  [name] is 33 y.o. female [phone] who presents to MAU with complaints of recurring headaches, occasional blurred vision,  and pain during intercourse. The patient thinks she has type 2 DM because she has been on google searching reasons for blurred vision.. At times she feels her vision has been changing. She feels at night her vision changes and notices the changes only when she is on her cell phone. She currently denies blurred vision.  She has tried Ibuprofen for her headaches and does not feel it has helped. She has a history of migraines; however does not know what type of medication she has taken in the past. Patient rates her headache pain 4/10 at this time.   OB History   Grav Para Term Preterm Abortions TAB SAB Ect Mult Living   4 3 3  0 1 0 1 0 0 3      Past Medical History  Diagnosis Date  . Hypertension     no longer takes meds  . Asthma   . Anxiety   . Depression   . Headache(784.0)     migraines  . Muscle spasm   . Scoliosis     Past Surgical History  Procedure Laterality Date  . Cholecystectomy    . Tubal ligation      pt has had pregnancy after and had  miscarriage    Family History  Problem Relation Age of Onset  . Cancer Father   . Hypertension Maternal Aunt   . Cancer Paternal Grandmother     History  Substance Use Topics  . Smoking status: Former Research scientist (life sciences)  . Smokeless tobacco: Not on file  . Alcohol Use: No    Allergies:  Allergies  Allergen Reactions  . Penicillins Itching  . Percocet [Oxycodone-Acetaminophen] Itching    Prescriptions prior to admission  Medication Sig Dispense Refill  . albuterol (PROVENTIL HFA;VENTOLIN HFA) 108 (90 BASE) MCG/ACT inhaler Inhale 1-2 puffs into the lungs every 6 (six) hours as needed for  wheezing or shortness of breath.  1 Inhaler  0  . ibuprofen (ADVIL,MOTRIN) 200 MG tablet Take 400 mg by mouth every 4 (four) hours as needed for mild pain or moderate pain.       Results for orders placed during the hospital encounter of 12/19/13 (from the past 48 hour(s))  URINALYSIS, ROUTINE W REFLEX MICROSCOPIC     Status: Abnormal   Collection Time    12/19/13  1:00 PM      Result Value Ref Range   Color, Urine YELLOW  YELLOW   APPearance CLEAR  CLEAR   Specific Gravity, Urine 1.020  1.005 - 1.030   pH 6.0  5.0 - 8.0   Glucose, UA NEGATIVE  NEGATIVE mg/dL   Hgb urine dipstick TRACE (*) NEGATIVE   Bilirubin Urine NEGATIVE  NEGATIVE   Ketones, ur NEGATIVE  NEGATIVE mg/dL   Protein, ur NEGATIVE  NEGATIVE mg/dL   Urobilinogen, UA 0.2  0.0 - 1.0 mg/dL   Nitrite NEGATIVE  NEGATIVE   Leukocytes, UA NEGATIVE  NEGATIVE  URINE MICROSCOPIC-ADD ON     Status: Abnormal   Collection Time    12/19/13  1:00 PM      Result Value Ref Range   Squamous Epithelial /  LPF FEW (*) RARE   RBC / HPF 0-2  <3 RBC/hpf   Urine-Other MUCOUS PRESENT    POCT PREGNANCY, URINE     Status: None   Collection Time    12/19/13  1:09 PM      Result Value Ref Range   Preg Test, Ur NEGATIVE  NEGATIVE   Comment:            THE SENSITIVITY OF THIS     METHODOLOGY IS >24 mIU/mL  GLUCOSE, CAPILLARY     Status: None   Collection Time    12/19/13  2:27 PM      Result Value Ref Range   Glucose-Capillary 85  70 - 99 mg/dL  WET PREP, GENITAL     Status: Abnormal   Collection Time    12/19/13  3:00 PM      Result Value Ref Range   Yeast Wet Prep HPF POC NONE SEEN  NONE SEEN   Trich, Wet Prep NONE SEEN  NONE SEEN   Clue Cells Wet Prep HPF POC FEW (*) NONE SEEN   WBC, Wet Prep HPF POC FEW (*) NONE SEEN   Comment: FEW BACTERIA SEEN    Review of Systems  Constitutional: Negative for fever and chills.  Eyes: Positive for blurred vision (At night only ).  Gastrointestinal: Positive for abdominal pain (During  intercourse only). Negative for nausea and vomiting.  Genitourinary: Negative for dysuria, urgency, frequency and hematuria.       No vaginal discharge. No vaginal bleeding. No dysuria.    Physical Exam   Blood pressure 129/82, pulse 74, resp. rate 98.  Physical Exam  Constitutional: She is oriented to person, place, and time. Vital signs are normal. She appears well-developed and well-nourished.  Non-toxic appearance. She does not have a sickly appearance. She does not appear ill. No distress.  HENT:  Head: Normocephalic.  Eyes: Pupils are equal, round, and reactive to light.  Neck: Neck supple.  Respiratory: Effort normal.  Genitourinary:  Speculum exam: Vagina - Small amount of creamy discharge, no odor Cervix - +scant contact bleeding Bimanual exam: Cervix closed, no CMT  Uterus non tender, normal size Mild, left adnexal pain. no masses bilaterally GC/Chlam, wet prep done Chaperone present for exam.   Musculoskeletal: Normal range of motion.  Neurological: She is alert and oriented to person, place, and time. GCS eye subscore is 4. GCS verbal subscore is 5. GCS motor subscore is 6.  Skin: Skin is warm. She is not diaphoretic.  Psychiatric: Her behavior is normal.    MAU Course  Procedures None  MDM UA  Fioricet 2 tabs Toradol 60 IM Pt rates her abdominal pain and headache pain 0/10 after medication.   Assessment and Plan   A:  1. Migraine without aura and without status migrainosus, not intractable   2.  Bacterial vaginosis   P:  Discharge home in stable condition RX: Flagyl and Fioricet Follow up with primary care DR for physical. [facility] information given Go to [facility] or [facility] if Headache persists.    Darrelyn Hillock Rasch, NP  12/19/2013, 1:40 PM

## 2013-12-19 NOTE — Discharge Instructions (Signed)
Headaches, Frequently Asked Questions MIGRAINE HEADACHES Q: What is migraine? What causes it? How can I treat it? A: Generally, migraine headaches begin as a dull ache. Then they develop into a constant, throbbing, and pulsating pain. You may experience pain at the temples. You may experience pain at the front or back of one or both sides of the head. The pain is usually accompanied by a combination of:  Nausea.  Vomiting.  Sensitivity to light and noise. Some people (about 15%) experience an aura (see below) before an attack. The cause of migraine is believed to be chemical reactions in the brain. Treatment for migraine may include over-the-counter or prescription medications. It may also include self-help techniques. These include relaxation training and biofeedback.  Q: What is an aura? A: About 15% of people with migraine get an "aura". This is a sign of neurological symptoms that occur before a migraine headache. You may see wavy or jagged lines, dots, or flashing lights. You might experience tunnel vision or blind spots in one or both eyes. The aura can include visual or auditory hallucinations (something imagined). It may include disruptions in smell (such as strange odors), taste or touch. Other symptoms include:  Numbness.  A "pins and needles" sensation.  Difficulty in recalling or speaking the correct word. These neurological events may last as long as 60 minutes. These symptoms will fade as the headache begins. Q: What is a trigger? A: Certain physical or environmental factors can lead to or "trigger" a migraine. These include:  Foods.  Hormonal changes.  Weather.  Stress. It is important to remember that triggers are different for everyone. To help prevent migraine attacks, you need to figure out which triggers affect you. Keep a headache diary. This is a good way to track triggers. The diary will help you talk to your healthcare professional about your condition. Q: Does  weather affect migraines? A: Bright sunshine, hot, humid conditions, and drastic changes in barometric pressure may lead to, or "trigger," a migraine attack in some people. But studies have shown that weather does not act as a trigger for everyone with migraines. Q: What is the link between migraine and hormones? A: Hormones start and regulate many of your body's functions. Hormones keep your body in balance within a constantly changing environment. The levels of hormones in your body are unbalanced at times. Examples are during menstruation, pregnancy, or menopause. That can lead to a migraine attack. In fact, about three quarters of all women with migraine report that their attacks are related to the menstrual cycle.  Q: Is there an increased risk of stroke for migraine sufferers? A: The likelihood of a migraine attack causing a stroke is very remote. That is not to say that migraine sufferers cannot have a stroke associated with their migraines. In persons under age 53, the most common associated factor for stroke is migraine headache. But over the course of a person's normal life span, the occurrence of migraine headache may actually be associated with a reduced risk of dying from cerebrovascular disease due to stroke.  Q: What are acute medications for migraine? A: Acute medications are used to treat the pain of the headache after it has started. Examples over-the-counter medications, NSAIDs, ergots, and triptans.  Q: What are the triptans? A: Triptans are the newest class of abortive medications. They are specifically targeted to treat migraine. Triptans are vasoconstrictors. They moderate some chemical reactions in the brain. The triptans work on receptors in your brain. Triptans help  to restore the balance of a neurotransmitter called serotonin. Fluctuations in levels of serotonin are thought to be a main cause of migraine.  °Q: Are over-the-counter medications for migraine effective? °A:  Over-the-counter, or "OTC," medications may be effective in relieving mild to moderate pain and associated symptoms of migraine. But you should see your caregiver before beginning any treatment regimen for migraine.  °Q: What are preventive medications for migraine? °A: Preventive medications for migraine are sometimes referred to as "prophylactic" treatments. They are used to reduce the frequency, severity, and length of migraine attacks. Examples of preventive medications include antiepileptic medications, antidepressants, beta-blockers, calcium channel blockers, and NSAIDs (nonsteroidal anti-inflammatory drugs). °Q: Why are anticonvulsants used to treat migraine? °A: During the past few years, there has been an increased interest in antiepileptic drugs for the prevention of migraine. They are sometimes referred to as "anticonvulsants". Both epilepsy and migraine may be caused by similar reactions in the brain.  °Q: Why are antidepressants used to treat migraine? °A: Antidepressants are typically used to treat people with depression. They may reduce migraine frequency by regulating chemical levels, such as serotonin, in the brain.  °Q: What alternative therapies are used to treat migraine? °A: The term "alternative therapies" is often used to describe treatments considered outside the scope of conventional Western medicine. Examples of alternative therapy include acupuncture, acupressure, and yoga. Another common alternative treatment is herbal therapy. Some herbs are believed to relieve headache pain. Always discuss alternative therapies with your caregiver before proceeding. Some herbal products contain arsenic and other toxins. °TENSION HEADACHES °Q: What is a tension-type headache? What causes it? How can I treat it? °A: Tension-type headaches occur randomly. They are often the result of temporary stress, anxiety, fatigue, or anger. Symptoms include soreness in your temples, a tightening band-like sensation  around your head (a "vice-like" ache). Symptoms can also include a pulling feeling, pressure sensations, and contracting head and neck muscles. The headache begins in your forehead, temples, or the back of your head and neck. Treatment for tension-type headache may include over-the-counter or prescription medications. Treatment may also include self-help techniques such as relaxation training and biofeedback. °CLUSTER HEADACHES °Q: What is a cluster headache? What causes it? How can I treat it? °A: Cluster headache gets its name because the attacks come in groups. The pain arrives with little, if any, warning. It is usually on one side of the head. A tearing or bloodshot eye and a runny nose on the same side of the headache may also accompany the pain. Cluster headaches are believed to be caused by chemical reactions in the brain. They have been described as the most severe and intense of any headache type. Treatment for cluster headache includes prescription medication and oxygen. °SINUS HEADACHES °Q: What is a sinus headache? What causes it? How can I treat it? °A: When a cavity in the bones of the face and skull (a sinus) becomes inflamed, the inflammation will cause localized pain. This condition is usually the result of an allergic reaction, a tumor, or an infection. If your headache is caused by a sinus blockage, such as an infection, you will probably have a fever. An x-ray will confirm a sinus blockage. Your caregiver's treatment might include antibiotics for the infection, as well as antihistamines or decongestants.  °REBOUND HEADACHES °Q: What is a rebound headache? What causes it? How can I treat it? °A: A pattern of taking acute headache medications too often can lead to a condition known as "rebound headache."   A pattern of taking too much headache medication includes taking it more than 2 days per week or in excessive amounts. That means more than the label or a caregiver advises. With rebound  headaches, your medications not only stop relieving pain, they actually begin to cause headaches. Doctors treat rebound headache by tapering the medication that is being overused. Sometimes your caregiver will gradually substitute a different type of treatment or medication. Stopping may be a challenge. Regularly overusing a medication increases the potential for serious side effects. Consult a caregiver if you regularly use headache medications more than 2 days per week or more than the label advises. ADDITIONAL QUESTIONS AND ANSWERS Q: What is biofeedback? A: Biofeedback is a self-help treatment. Biofeedback uses special equipment to monitor your body's involuntary physical responses. Biofeedback monitors:  Breathing.  Pulse.  Heart rate.  Temperature.  Muscle tension.  Brain activity. Biofeedback helps you refine and perfect your relaxation exercises. You learn to control the physical responses that are related to stress. Once the technique has been mastered, you do not need the equipment any more. Q: Are headaches hereditary? A: Four out of five (80%) of people that suffer report a family history of migraine. Scientists are not sure if this is genetic or a family predisposition. Despite the uncertainty, a child has a 50% chance of having migraine if one parent suffers. The child has a 75% chance if both parents suffer.  Q: Can children get headaches? A: By the time they reach high school, most young people have experienced some type of headache. Many safe and effective approaches or medications can prevent a headache from occurring or stop it after it has begun.  Q: What type of doctor should I see to diagnose and treat my headache? A: Start with your primary caregiver. Discuss his or her experience and approach to headaches. Discuss methods of classification, diagnosis, and treatment. Your caregiver may decide to recommend you to a headache specialist, depending upon your symptoms or other  physical conditions. Having diabetes, allergies, etc., may require a more comprehensive and inclusive approach to your headache. The National Headache Foundation will provide, upon request, a list of [facility] physician members in your state. Document Released: 06/30/2003 Document Revised: 07/02/2011 Document Reviewed: 12/08/2007 [facility] Patient Information 2015 [place], [place]. This information is not intended to replace advice given to you by your health care provider. Make sure you discuss any questions you have with your health care provider.  Migraine Headache A migraine headache is very bad, throbbing pain on one or both sides of your head. Talk to your doctor about what things may bring on (trigger) your migraine headaches. HOME CARE  Only take medicines as told by your doctor.  Lie down in a dark, quiet room when you have a migraine.  Keep a journal to find out if certain things bring on migraine headaches. For example, write down:  What you eat and drink.  How much sleep you get.  Any change to your diet or medicines.  Lessen how much alcohol you drink.  Quit smoking if you smoke.  Get enough sleep.  Lessen any stress in your life.  Keep lights dim if bright lights bother you or make your migraines worse. GET HELP RIGHT AWAY IF:   Your migraine becomes really bad.  You have a fever.  You have a stiff neck.  You have trouble seeing.  Your muscles are weak, or you lose muscle control.  You lose your balance or have trouble  walking.  You feel like you will pass out (faint), or you pass out.  You have really bad symptoms that are different than your first symptoms. MAKE SURE YOU:   Understand these instructions.  Will watch your condition.  Will get help right away if you are not doing well or get worse. Document Released: 01/17/2008 Document Revised: 07/02/2011 Document Reviewed: 12/15/2012 [facility] Patient Information 2015 [place], [place]. This information is  not intended to replace advice given to you by your health care provider. Make sure you discuss any questions you have with your health care provider.

## 2013-12-21 LAB — GC/CHLAMYDIA PROBE AMP
CT PROBE, AMP APTIMA: NEGATIVE
GC Probe RNA: NEGATIVE

## 2014-01-16 ENCOUNTER — Emergency Department (HOSPITAL_COMMUNITY)
Admission: EM | Admit: 2014-01-16 | Discharge: 2014-01-16 | Disposition: A | Discharge status: Home / Self Care | Payer: Medicaid Other | Attending: Emergency Medicine | Admitting: Emergency Medicine

## 2014-01-16 ENCOUNTER — Encounter (HOSPITAL_COMMUNITY): Payer: Self-pay | Admitting: Emergency Medicine

## 2014-01-16 DIAGNOSIS — Z8739 Personal history of other diseases of the musculoskeletal system and connective tissue: Secondary | ICD-10-CM | POA: Insufficient documentation

## 2014-01-16 DIAGNOSIS — Z87891 Personal history of nicotine dependence: Secondary | ICD-10-CM | POA: Insufficient documentation

## 2014-01-16 DIAGNOSIS — Z880 Allergy status to penicillin: Secondary | ICD-10-CM | POA: Insufficient documentation

## 2014-01-16 DIAGNOSIS — Z8659 Personal history of other mental and behavioral disorders: Secondary | ICD-10-CM | POA: Insufficient documentation

## 2014-01-16 DIAGNOSIS — G43009 Migraine without aura, not intractable, without status migrainosus: Secondary | ICD-10-CM

## 2014-01-16 DIAGNOSIS — G43909 Migraine, unspecified, not intractable, without status migrainosus: Secondary | ICD-10-CM | POA: Insufficient documentation

## 2014-01-16 DIAGNOSIS — Z79899 Other long term (current) drug therapy: Secondary | ICD-10-CM | POA: Insufficient documentation

## 2014-01-16 DIAGNOSIS — J45909 Unspecified asthma, uncomplicated: Secondary | ICD-10-CM | POA: Insufficient documentation

## 2014-01-16 DIAGNOSIS — I10 Essential (primary) hypertension: Secondary | ICD-10-CM | POA: Insufficient documentation

## 2014-01-16 DIAGNOSIS — Z792 Long term (current) use of antibiotics: Secondary | ICD-10-CM | POA: Insufficient documentation

## 2014-01-16 MED ORDER — KETOROLAC TROMETHAMINE 30 MG/ML IJ SOLN
30.0000 mg | INTRAMUSCULAR | Status: AC
  Administered 2014-01-16: 30 mg via INTRAVENOUS
  Filled 2014-01-16: qty 1

## 2014-01-16 MED ORDER — DIPHENHYDRAMINE HCL 50 MG/ML IJ SOLN
25.0000 mg | Freq: Once | INTRAMUSCULAR | Status: AC
  Administered 2014-01-16: 25 mg via INTRAVENOUS
  Filled 2014-01-16: qty 1

## 2014-01-16 MED ORDER — SODIUM CHLORIDE 0.9 % IV BOLUS (SEPSIS)
1000.0000 mL | Freq: Once | INTRAVENOUS | Status: AC
  Administered 2014-01-16: 1000 mL via INTRAVENOUS

## 2014-01-16 MED ORDER — PROCHLORPERAZINE EDISYLATE 5 MG/ML IJ SOLN
10.0000 mg | Freq: Once | INTRAMUSCULAR | Status: AC
  Administered 2014-01-16: 10 mg via INTRAVENOUS
  Filled 2014-01-16: qty 2

## 2014-01-16 NOTE — ED Notes (Signed)
Pt. Reports having a migraine for 2 weeks, nausea, photohobia, dizziness and lightheaded

## 2014-01-16 NOTE — ED Notes (Signed)
Elizabeth, PA at bedside.  

## 2014-01-16 NOTE — ED Provider Notes (Signed)
CSN: [id number]     Arrival date & time 01/16/14  1519 History   First MD Initiated Contact with Patient 01/16/14 1534     Chief Complaint  Patient presents with  . Migraine   (Consider location/radiation/quality/duration/timing/severity/associated sxs/prior Treatment) HPI Dana Hughes presents with headache x 2 weeks. Dana Hughes reports has migraine type headache most days of the month. Dana Hughes describes the pain as a constant, dull/throbbing pain and rates it as a 9/10.  Dana Hughes reports Dana Hughes has had some blurry vision last week, nausea, sensitivity light and sound.  Dana Hughes reports some subjective fevers, and abdominal pain 1 mo nth ago but none associated with this headache. Dana Hughes denies vomiting or diarrhea.  Past Medical History  Diagnosis Date  . Hypertension     no longer takes meds  . Asthma   . Anxiety   . Depression   . Headache(784.0)     migraines  . Muscle spasm   . Scoliosis    Past Surgical History  Procedure Laterality Date  . Cholecystectomy    . Tubal ligation      pt has had pregnancy after and had  miscarriage   Family History  Problem Relation Age of Onset  . Cancer Father   . Hypertension Maternal Aunt   . Cancer Paternal Grandmother    History  Substance Use Topics  . Smoking status: Former Research scientist (life sciences)  . Smokeless tobacco: Not on file  . Alcohol Use: No   OB History   Grav Para Term Preterm Abortions TAB SAB Ect Mult Living   4 3 3  0 1 0 1 0 0 3     Review of Systems  Constitutional: Negative for fever and chills.  HENT: Negative for sore throat.   Eyes: Positive for photophobia. Negative for visual disturbance.  Respiratory: Negative for cough and shortness of breath.   Cardiovascular: Negative for chest pain and leg swelling.  Gastrointestinal: Positive for nausea. Negative for vomiting and diarrhea.  Genitourinary: Negative for dysuria.  Musculoskeletal: Negative for myalgias.  Skin: Negative for rash.  Neurological: Positive for headaches. Negative for  weakness and numbness.    Allergies  Penicillins and Percocet  Home Medications   Prior to Admission medications   Medication Sig Start Date End Date Taking? Authorizing Provider  albuterol (PROVENTIL HFA;VENTOLIN HFA) 108 (90 BASE) MCG/ACT inhaler Inhale 1-2 puffs into the lungs every 6 (six) hours as needed for wheezing or shortness of breath. 12/19/13   Ronnald Ramp, NP  butalbital-acetaminophen-caffeine (FIORICET) [phone] MG per tablet Take 1-2 tablets by mouth every 6 (six) hours as needed for headache. 12/19/13 12/19/14  Darrelyn Hillock Rasch, NP  ibuprofen (ADVIL,MOTRIN) 200 MG tablet Take 400 mg by mouth every 4 (four) hours as needed for mild pain or moderate pain.    Historical Provider, MD  metroNIDAZOLE (FLAGYL) 500 MG tablet Take 1 tablet (500 mg total) by mouth 2 (two) times daily. 12/19/13   Darrelyn Hillock Rasch, NP   BP 125/78  Pulse 60  Temp(Src) 99.2 F (37.3 C)  Resp 18  Wt 204 lb 9 oz (92.789 kg)  SpO2 100%  LMP 12/22/2013 Physical Exam  Nursing note and vitals reviewed. Constitutional: Dana Hughes is oriented to person, place, and time. Dana Hughes appears well-developed and well-nourished. No distress.  HENT:  Head: Normocephalic and atraumatic.  Mouth/Throat: Oropharynx is clear and moist. No oropharyngeal exudate.  Eyes: Conjunctivae are normal.  Neck: Neck supple. No thyromegaly present.  Cardiovascular: Normal rate, regular rhythm and intact distal pulses.  Pulmonary/Chest: Effort normal and breath sounds normal. No respiratory distress. Dana Hughes has no wheezes. Dana Hughes has no rales. Dana Hughes exhibits no tenderness.  Abdominal: Soft. There is no tenderness.  Musculoskeletal: Dana Hughes exhibits no tenderness.  Lymphadenopathy:    Dana Hughes has no cervical adenopathy.  Neurological: Dana Hughes is alert and oriented to person, place, and time. No cranial nerve deficit. Coordination normal.  Skin: Skin is warm and dry. No rash noted. Dana Hughes is not diaphoretic.  Psychiatric: Dana Hughes has a normal mood and  affect.    ED Course  Procedures (including critical care time) Labs Review Labs Reviewed - No data to display  Imaging Review No results found.   EKG Interpretation None      MDM   Final diagnoses:  Nonintractable migraine, unspecified migraine type   33 yo female with report of headache similar to her usual migraine.  History and exam not concerning for meningeal signs, temporal arteritis, SAH, or ICH.  Pt is afebrile and has normal neuro exam and no focal deficits, nuchal rigidity or visual changes. Pt's pain was managed and improved while in ED.  Discharge instructions include resources to establish care with PCP to discuss prophylactic medication. Pt verbalizes understanding and is agreeable with plan to dc. Return precautions provided.  Filed Vitals:   01/16/14 1600 01/16/14 1700 01/16/14 1730 01/16/14 1814  BP: 110/65 109/56 115/68 125/80  Pulse: 70 63 65 69  Temp:    98.8 F (37.1 C)  TempSrc:    Oral  Resp:    17  Weight:      SpO2: 100% 97% 100% 100%   Meds given in ED:  Medications  sodium chloride 0.9 % bolus 1,000 mL (0 mLs Intravenous Stopped 01/16/14 1731)  prochlorperazine (COMPAZINE) injection 10 mg (10 mg Intravenous Given 01/16/14 1615)  diphenhydrAMINE (BENADRYL) injection 25 mg (25 mg Intravenous Given 01/16/14 1616)  ketorolac (TORADOL) 30 MG/ML injection 30 mg (30 mg Intravenous Given 01/16/14 1616)    Discharge Medication List as of 01/16/2014  6:14 PM         Britt Bottom, NP 01/18/14 1315

## 2014-01-16 NOTE — Discharge Instructions (Signed)
Please follow the directions provided.  Be sure to use the resources below to establish care with a primary care provider.  A regular doctor can help with daily medicines to prevent the frequency of your headaches.  You are having a headache. No specific cause was found today for your headache. It may have been a migraine or other cause of headache. Stress, anxiety, fatigue, and depression are common triggers for headaches. Your headache today does not appear to be life-threatening or require hospitalization, but often the exact cause of headaches is not determined in the emergency department. Therefore, follow-up with your doctor is very important to find out what may have caused your headache, and whether or not you need any further diagnostic testing or treatment. Sometimes headaches can appear benign (not harmful), but then more serious symptoms can develop which should prompt an immediate re-evaluation by your doctor or the emergency department. SEEK MEDICAL ATTENTION IF: You develop possible problems with medications prescribed.  The medications don't resolve your headache, if it recurs , or if you have multiple episodes of vomiting or can't take fluids. You have a change from the usual headache. RETURN IMMEDIATELY IF you develop a sudden, severe headache or confusion, become poorly responsive or faint, develop a fever above 100.46F or problem breathing, have a change in speech, vision, swallowing, or understanding, or develop new weakness, numbness, tingling, incoordination, or have a seizure.   SEEK IMMEDIATE MEDICAL CARE IF:  Your migraine becomes severe.  You have a stiff neck.  You have loss of vision.  You have muscular weakness or loss of muscle control.  You start losing your balance or have trouble walking.  You feel faint or pass out.  You have severe symptoms that are different from your first symptoms.    Emergency Department Resource Guide 1) Find a Doctor and Pay Out of  Pocket Although you won't have to find out who is covered by your insurance plan, it is a good idea to ask around and get recommendations. You will then need to call the office and see if the doctor you have chosen will accept you as a new patient and what types of options they offer for patients who are self-pay. Some doctors offer discounts or will set up payment plans for their patients who do not have insurance, but you will need to ask so you aren't surprised when you get to your appointment.  2) Contact Your Local Health Department Not all health departments have doctors that can see patients for sick visits, but many do, so it is worth a call to see if yours does. If you don't know where your local health department is, you can check in your phone book. The CDC also has a tool to help you locate your state's health department, and many state websites also have listings of all of their local health departments.  3) Find a [facility] If your illness is not likely to be very severe or complicated, you may want to try a walk in clinic. These are popping up all over the country in pharmacies, drugstores, and shopping centers. They're usually staffed by nurse practitioners or physician assistants that have been trained to treat common illnesses and complaints. They're usually fairly quick and inexpensive. However, if you have serious medical issues or chronic medical problems, these are probably not your best option.  No Primary Care Doctor: - Call Health Connect at  [phone] - they can help you locate a primary care doctor  that  accepts your insurance, provides certain services, etc. - Physician Referral Service- [phone]  Chronic Pain Problems: Organization         Address  Phone   Notes  [facility]  [phone] Patients need to be referred by their primary care doctor.   Medication Assistance: Organization         Address  Phone   Notes  [facility]  Medication [facility] [address] [place], [place] [address] [phone] --Must be a resident of [facility] -- Must have NO insurance coverage whatsoever (no Medicaid/ Medicare, etc.) -- The pt. MUST have a primary care doctor that directs their care regularly and follows them in the community   Med[facility]ist  [phone]   [employer]  [phone]    Agencies that provide inexpensive medical care: Organization         Address  Phone   Notes  [place]  [phone]   [facility]    [phone]   [facility] [place], [place] [address] [phone]   [place] [address], [place] [phone]   Planned Parenthood    [phone]   [facility]    [phone]   [place] and [place] [address], [place] Phone:  [phone], Fax:  [phone] Hours of Operation:  9 am - 6 pm, M-F.  Also accepts Medicaid/Medicare and self-pay.  [facility] for [place] [address], [address], [place] Phone: [phone], Fax: [phone]. Hours of Operation:  8:30 am - 5:30 pm, M-F.  Also accepts Medicaid and self-pay.  [facility] [address], [place] Phone: [phone]   [place], [place], [place] [phone], Ext. [phone] Mondays & Thursdays: 7-9 AM.  First 15 patients are seen on a first come, first serve basis.    [place] Providers:  Organization         Address  Phone   Notes  [facility] [address], [place] [phone] Also accepts self-pay patients.  [facility] [address], [place]  [phone]   [place], [address], [place] [phone]   [facility] [address] [place] [phone]   Lucianne Lei [address], [place]   [phone] Only accepts [place] Access [place] patients after they have their name applied to their card.   Self-Pay (no insurance) in [facility]:  Organization         Address  Phone   Notes  Sickle Cell Patients, [facility] [place] [phone]   [facility] [place] [phone]   [facility] [place]  [place], [place], [place] [phone]   [facility]/Dr. Osei-Bonsu  [address], [place] or [address], [place] [phone] Phone number for both [place] and [place] locations is the same.  Urgent Medical and [facility] [address], [place] [phone]   [facility] [address] [place] or [address] [phone]  [facility] [place] [phone], phone; [phone], fax Sees patients 1st and 3rd Saturday of every month.  Must not qualify for public or private insurance (i.e. Medicaid, Medicare, [place] Health Choice, Veterans' Benefits)  Household income should be no more than 200% of the poverty level The clinic cannot treat you if you are pregnant or think you are pregnant  Sexually transmitted diseases are not treated at the clinic.    Dental Care: Organization         Address  Phone  Notes  [facility] Department of [facility] [place] [phone] Accepts children up to age 4 who are enrolled in [place] or [place] Ruiz; pregnant women with a Medicaid card; and children who have applied for Medicaid or [place] Health Choice, but were declined, whose parents can pay a reduced fee at time of service.  [facility] Department of [facility]  [address], [place]  [phone] Accepts children up to age 45 who are enrolled in [place] or [place]; pregnant women with a Medicaid card; and children who have applied for Medicaid or [place] Health Choice, but were declined, whose parents can pay a reduced fee at time of service.  [facility] PRO[facility]  [place] [phone] Patients are seen by appointment only. Walk-ins are not accepted. [place] will see patients 29 years of age and older. Monday - Tuesday (8am-5pm) Most Wednesdays (8:30-5pm) $30 per visit, cash only  [facility] Adult Dental Access PROGRAM  [address], [facility] [phone] Patients are seen by appointment only. Walk-ins are not accepted. [place] will see patients 16 years of age and older. One Wednesday Evening (Monthly: Volunteer Based).  $30 per visit, cash only  [place]  [phone] for adults; Children under age 74, call Graduate Pediatric Dentistry at [phone]. Children aged 62-14, please call [phone] to request a pediatric application.  Dental services are provided in all areas of dental care including fillings, crowns and bridges, complete and partial dentures, implants, gum treatment, root canals, and extractions. Preventive care is also provided. Treatment is provided to both adults and children. Patients are selected via a lottery and there is often a waiting list.   [facility] [address], [place]  [phone] [email or web address]   [facility] [address] [place], [place] [phone], Ext. [phone] Second and Fourth Thursday of each month, opens at 6:30 AM; Clinic ends at 9 AM.  Patients are seen on a first-come first-served basis, and a limited number are seen during each clinic.   [facility]  [address] [place], [place] [phone]   Eligibility Requirements You must have lived in [place], [place], or [place]  counties for at least the last three months.   You cannot be eligible for state or federal sponsored [employer], including [employer], [place], or [employer].   You generally cannot be eligible for healthcare insurance through your employer.    How to apply: Eligibility screenings are held every Tuesday and Wednesday afternoon from 1:00 pm until 4:00 pm. You do not need an appointment for the interview!  [facility] [address] [place], [place]   [place]  [place] Department  [place]  Department  [phone]    Behavioral Health Resources in the Community: Intensive Outpatient Programs Organization         Address  Phone  Notes  [place]. [address], [place], [place] [phone]   [facility] Outpatient [address] [place], [place]   ADS: [facility] [address] [place], [place]   [place] [address],  [place], [place] or [phone]   Substance Abuse Resources Organization         Address  Phone  Notes  Alcohol and Drug Services  [phone]   [place]  [phone]   [facility] [place]   Chinita Pester  [phone]   Residential & Outpatient Substance Abuse Program  [phone]   Psychological Services Organization         Address  Phone  Notes  [facility] [place]  [place]  [phone]   [place] [address] [place] or [phone]    Mobile Crisis Teams Organization         Address  Phone  Notes  [facility], Mobile Crisis Care Unit  [phone]   [facility]otherapeutic [facility]  [address] [place], [place]   [place] Levels [address], [place] [phone]    Self-Help/Support Groups Organization         Address  Phone             Notes  [place]. of [place] - variety of support groups  [place] Call for more information  Narcotics Anonymous (NA), [facility] [address], [employer]   2 meetings at this location   Special educational needs teacher         Address  Phone  Notes  [facility] [place],    [place]  [phone]   [facility]  [address] [place] [phone], [place], [place]   [place], [place] [phone] Admissions: 8am-3pm M-F  Incentives Substance [place] [address]    [place], [place] [phone]   [facility] [address] [place], [place], [place]   [facility] [address],  [place], [place]   [facility]ight Programs - Intensive Outpatient [address], [place], [place]   [facility] ([place].,  [place], [place] [phone] or [phone]   [facility] (RTS) [address], [place], [place] Accepts Medicaid  [facility] [place] [address],  [place] [phone] Substance Abuse/Addiction Treatment   [facility] Organization         Address  Phone  Notes  [facility]  [phone]   Domenic Schwab, PhD [address] [place]lis [place], [place]   [phone] or [phone]   [place], [place] [phone]   [place] [address], [place], [place] [phone] Insurance/Medicaid/sponsorship through [employer] and Families [address] [place]                                    [place], [place] [phone] [place]  [place], [place] [phone]    Dr. Adele Schilder  [phone]   [facility] [place] Dept. 1) [address], [place] 2) [place] 3)  [place] [address], [place] [phone]  [phone]   [place] [phone] or [phone] (After Hours)

## 2014-01-16 NOTE — ED Notes (Addendum)
Elizabeth,NP at bedside

## 2014-01-20 NOTE — ED Provider Notes (Signed)
Medical screening examination/treatment/procedure(s) were performed by non-physician practitioner and as supervising physician I was immediately available for consultation/collaboration.   EKG Interpretation None       Richarda Blade, MD 01/20/14 [phone]

## 2014-02-01 ENCOUNTER — Encounter (HOSPITAL_COMMUNITY): Payer: Self-pay | Admitting: Emergency Medicine

## 2014-02-01 ENCOUNTER — Emergency Department (HOSPITAL_COMMUNITY)
Admission: EM | Admit: 2014-02-01 | Discharge: 2014-02-01 | Discharge status: Against Medical Advice | Payer: Medicaid Other | Attending: Emergency Medicine | Admitting: Emergency Medicine

## 2014-02-01 DIAGNOSIS — I10 Essential (primary) hypertension: Secondary | ICD-10-CM | POA: Insufficient documentation

## 2014-02-01 DIAGNOSIS — R51 Headache: Secondary | ICD-10-CM | POA: Insufficient documentation

## 2014-02-01 DIAGNOSIS — J45909 Unspecified asthma, uncomplicated: Secondary | ICD-10-CM | POA: Insufficient documentation

## 2014-02-01 NOTE — ED Notes (Signed)
Pt c/o increased stress and HA with anxiety attacks and sts some asthma today; pt in nad at present

## 2014-02-01 NOTE — ED Notes (Addendum)
H/a x 1 week. Hx. Of htn. Does not take meds. bp 140/100.  Been arguing with bf since last night.

## 2014-02-16 ENCOUNTER — Encounter (HOSPITAL_COMMUNITY): Payer: Self-pay | Admitting: Emergency Medicine

## 2014-02-16 ENCOUNTER — Emergency Department (HOSPITAL_COMMUNITY)
Admission: EM | Admit: 2014-02-16 | Discharge: 2014-02-16 | Disposition: A | Discharge status: Home / Self Care | Payer: Medicaid Other | Attending: Emergency Medicine | Admitting: Emergency Medicine

## 2014-02-16 DIAGNOSIS — M549 Dorsalgia, unspecified: Secondary | ICD-10-CM

## 2014-02-16 DIAGNOSIS — Z8739 Personal history of other diseases of the musculoskeletal system and connective tissue: Secondary | ICD-10-CM | POA: Insufficient documentation

## 2014-02-16 DIAGNOSIS — Z880 Allergy status to penicillin: Secondary | ICD-10-CM | POA: Insufficient documentation

## 2014-02-16 DIAGNOSIS — R32 Unspecified urinary incontinence: Secondary | ICD-10-CM | POA: Insufficient documentation

## 2014-02-16 DIAGNOSIS — Z7951 Long term (current) use of inhaled steroids: Secondary | ICD-10-CM | POA: Insufficient documentation

## 2014-02-16 DIAGNOSIS — M419 Scoliosis, unspecified: Secondary | ICD-10-CM | POA: Insufficient documentation

## 2014-02-16 DIAGNOSIS — I10 Essential (primary) hypertension: Secondary | ICD-10-CM | POA: Insufficient documentation

## 2014-02-16 DIAGNOSIS — Z79899 Other long term (current) drug therapy: Secondary | ICD-10-CM | POA: Insufficient documentation

## 2014-02-16 DIAGNOSIS — J45909 Unspecified asthma, uncomplicated: Secondary | ICD-10-CM | POA: Insufficient documentation

## 2014-02-16 DIAGNOSIS — Z87891 Personal history of nicotine dependence: Secondary | ICD-10-CM | POA: Insufficient documentation

## 2014-02-16 DIAGNOSIS — Z8659 Personal history of other mental and behavioral disorders: Secondary | ICD-10-CM | POA: Insufficient documentation

## 2014-02-16 MED ORDER — NAPROXEN 500 MG PO TABS: 500.0000 mg | ORAL_TABLET | Freq: Two times a day (BID) | ORAL | Status: DC

## 2014-02-16 MED ORDER — CYCLOBENZAPRINE HCL 10 MG PO TABS: 10.0000 mg | ORAL_TABLET | Freq: Two times a day (BID) | ORAL | Status: DC | PRN

## 2014-02-16 NOTE — ED Provider Notes (Signed)
CSN: [id number]     Arrival date & time 02/16/14  1751 History  This chart was scribed for non-physician practitioner Etta Quill, NP working with Dot Lanes, MD by Lora Havens, ED Scribe. This patient was seen in TR10C/TR10C and the patient's care was started at 7:46 PM.    Chief Complaint  Patient presents with  . Back Pain   Patient is a 33 y.o. female presenting with back pain. The history is provided by the patient. No language interpreter was used.  Back Pain Pain severity:  Mild Timing:  Constant Chronicity:  Chronic Context: emotional stress   Relieved by:  Nothing  HPI Comments: Dana Hughes is a 33 y.o. female with a history of HTN, asthma, bronchitis who presents to the Emergency Department complaining of lower and mid back pain. She notes her friends mentioned to her that she may have scoliosis. Pt has urinary/bowel incontinence and tingling in her feet as associated symptoms. She tried exercise which provided her mild relief. Pt has taken Ibuprofen, Tylenol, Aleve, patches, extra strength Advil which provided no relief. Pt mentions she has been stressed recently because her grandmother recently passed away. She is currently not taking medication for her HTN. She has no PCP and no insurance. Pt works for [employer]. She denies numbness in her legs or vagina and a chance for a UTI.  Past Medical History  Diagnosis Date  . Hypertension     no longer takes meds  . Asthma   . Anxiety   . Depression   . Headache(784.0)     migraines  . Muscle spasm   . Scoliosis    Past Surgical History  Procedure Laterality Date  . Cholecystectomy    . Tubal ligation      pt has had pregnancy after and had  miscarriage   Family History  Problem Relation Age of Onset  . Cancer Father   . Hypertension Maternal Aunt   . Cancer Paternal Grandmother    History  Substance Use Topics  . Smoking status: Former Research scientist (life sciences)  . Smokeless tobacco: Not on file  . Alcohol  Use: No   OB History   Grav Para Term Preterm Abortions TAB SAB Ect Mult Living   4 3 3  0 1 0 1 0 0 3     Review of Systems  Musculoskeletal: Positive for back pain.  All other systems reviewed and are negative.  Allergies  Penicillins and Percocet  Home Medications   Prior to Admission medications   Medication Sig Start Date End Date Taking? Authorizing Provider  albuterol (PROVENTIL HFA;VENTOLIN HFA) 108 (90 BASE) MCG/ACT inhaler Inhale 1-2 puffs into the lungs every 6 (six) hours as needed for wheezing or shortness of breath. 12/19/13   Darrelyn Hillock Rasch, NP  ibuprofen (ADVIL,MOTRIN) 200 MG tablet Take 400 mg by mouth every 4 (four) hours as needed for mild pain or moderate pain.    Historical Provider, MD   BP 138/101  Pulse 100  Temp(Src) 98.3 F (36.8 C) (Oral)  Resp 18  SpO2 98%  LMP 02/02/2014 Physical Exam  Constitutional: She is oriented to person, place, and time. She appears well-developed.  HENT:  Head: Normocephalic.  Eyes: Conjunctivae and EOM are normal. No scleral icterus.  Neck: Neck supple. No thyromegaly present.  Cardiovascular: Normal rate and regular rhythm.  Exam reveals no gallop and no friction rub.   No murmur heard. Pulmonary/Chest: Breath sounds normal. No stridor. She has no wheezes. She has  no rales. She exhibits no tenderness.  Abdominal: She exhibits no distension. There is no tenderness. There is no rebound.  Musculoskeletal: Normal range of motion. She exhibits no edema.  Lymphadenopathy:    She has no cervical adenopathy.  Neurological: She is oriented to person, place, and time. She exhibits normal muscle tone. Coordination normal.  Skin: No rash noted. No erythema.  Psychiatric: She has a normal mood and affect. Her behavior is normal.    ED Course  Procedures  DIAGNOSTIC STUDIES: Oxygen Saturation is 98% on room air, normal by my interpretation.    COORDINATION OF CARE: 7:54 PM Discussed treatment plan with pt at bedside and  pt agreed to plan.  Labs Review Labs Reviewed - No data to display  Imaging Review No results found.   EKG Interpretation None      MDM   Final diagnoses:  None  I personally performed the services described in this documentation, which was scribed in my presence. The recorded information has been reviewed and is accurate.   Low back pain. History of scoliosis. No red flag symptoms.  Will treat conservatively with anti-inflammatory and muscle relaxant.  Follow-up with ortho if no improvement.    Norman Herrlich, NP 02/17/14 [phone]

## 2014-02-16 NOTE — Discharge Instructions (Signed)
Scoliosis  Scoliosis is the name given to a spine that curves sideways. Scoliosis can cause twisting of your shoulders, hips, chest, back, and rib cage.   CAUSES   The cause of scoliosis is not always known. It may be caused by a birth defect or by a disease that can cause muscular dysfunction and imbalance, such as cerebral palsy and muscular dystrophy.   RISK FACTORS  Having a disease that causes muscle disease or dysfunction.  SIGNS AND SYMPTOMS  Scoliosis often has no signs or symptoms. If they are present, they may include:  · Unequal size of one body side compared to the other (asymmetry).  · Visible curvature of the spine.  · Pain. The pain may limit physical activity.  · Shortness of breath.  · Bowel or bladder issues.  DIAGNOSIS  A skilled health care provider will perform an evaluation. This will involve:  · Taking your history.  · Performing a physical examination.  · Performing a neurological exam to detect nerve or muscle function loss.  · Range of motion studies on the spine.  · X-rays.  An MRI may also be obtained.  TREATMENT   Treatment varies depending on the nature, extent, and severity of the disease. If the curvature is not great, you may need only observation. A brace may be used to prevent scoliosis from progressing. A brace may also be needed during growth spurts. Physical therapy may be of benefit. Surgery may be required.   HOME CARE INSTRUCTIONS   · Your health care provider may suggest exercises to strengthen your muscles. Perform them as directed.  · Ask your health care provider before participating in any sports.    · If you have been prescribed an orthopedic brace, wear it as instructed by your health care provider.   SEEK MEDICAL CARE IF:  Your brace causes the skin to become sore (chafe) or is uncomfortable.   SEEK IMMEDIATE MEDICAL CARE IF:  · You have back pain that is not relieved by the medicines prescribed by your health care provider.    · Your legs feel weak or you lose  function in your legs.  · You lose some bowel or bladder control.    Document Released: 04/06/2000 Document Revised: 04/14/2013 Document Reviewed: 12/15/2012  ExitCare® Patient Information ©2015 [employer]. This information is not intended to replace advice given to you by your health care provider. Make sure you discuss any questions you have with your health care provider.

## 2014-02-16 NOTE — ED Notes (Signed)
Pt reports lower back pain x 2002 but reports it has increased in last 4-5 weeks. Denies any new injuries. Concerned about scoliosis. Pt ambulatory to triage. Denies urinary/bowel incontinence.

## 2014-02-16 NOTE — ED Notes (Signed)
Pt. And family given drinks.

## 2014-02-18 NOTE — ED Provider Notes (Signed)
Medical screening examination/treatment/procedure(s) were performed by non-physician practitioner and as supervising physician I was immediately available for consultation/collaboration.   Dot Lanes, MD 02/18/14 1352

## 2014-02-22 ENCOUNTER — Encounter (HOSPITAL_COMMUNITY): Payer: Self-pay | Admitting: Emergency Medicine

## 2014-04-05 ENCOUNTER — Encounter (HOSPITAL_COMMUNITY): Payer: Self-pay | Admitting: Emergency Medicine

## 2014-04-05 ENCOUNTER — Emergency Department (HOSPITAL_COMMUNITY)
Admission: EM | Admit: 2014-04-05 | Discharge: 2014-04-05 | Disposition: A | Discharge status: Home / Self Care | Payer: Medicaid Other | Attending: Emergency Medicine | Admitting: Emergency Medicine

## 2014-04-05 DIAGNOSIS — J45909 Unspecified asthma, uncomplicated: Secondary | ICD-10-CM | POA: Insufficient documentation

## 2014-04-05 DIAGNOSIS — M7989 Other specified soft tissue disorders: Secondary | ICD-10-CM

## 2014-04-05 DIAGNOSIS — Z87891 Personal history of nicotine dependence: Secondary | ICD-10-CM | POA: Insufficient documentation

## 2014-04-05 DIAGNOSIS — G43909 Migraine, unspecified, not intractable, without status migrainosus: Secondary | ICD-10-CM | POA: Insufficient documentation

## 2014-04-05 DIAGNOSIS — Z8659 Personal history of other mental and behavioral disorders: Secondary | ICD-10-CM | POA: Insufficient documentation

## 2014-04-05 DIAGNOSIS — I10 Essential (primary) hypertension: Secondary | ICD-10-CM | POA: Insufficient documentation

## 2014-04-05 DIAGNOSIS — Z791 Long term (current) use of non-steroidal anti-inflammatories (NSAID): Secondary | ICD-10-CM | POA: Insufficient documentation

## 2014-04-05 DIAGNOSIS — Z79899 Other long term (current) drug therapy: Secondary | ICD-10-CM | POA: Insufficient documentation

## 2014-04-05 DIAGNOSIS — R42 Dizziness and giddiness: Secondary | ICD-10-CM | POA: Insufficient documentation

## 2014-04-05 DIAGNOSIS — R2233 Localized swelling, mass and lump, upper limb, bilateral: Secondary | ICD-10-CM | POA: Insufficient documentation

## 2014-04-05 DIAGNOSIS — M791 Myalgia, unspecified site: Secondary | ICD-10-CM

## 2014-04-05 DIAGNOSIS — M62830 Muscle spasm of back: Secondary | ICD-10-CM | POA: Insufficient documentation

## 2014-04-05 DIAGNOSIS — Z880 Allergy status to penicillin: Secondary | ICD-10-CM | POA: Insufficient documentation

## 2014-04-05 DIAGNOSIS — Z3202 Encounter for pregnancy test, result negative: Secondary | ICD-10-CM | POA: Insufficient documentation

## 2014-04-05 DIAGNOSIS — M549 Dorsalgia, unspecified: Secondary | ICD-10-CM | POA: Insufficient documentation

## 2014-04-05 LAB — URINALYSIS, ROUTINE W REFLEX MICROSCOPIC
BILIRUBIN URINE: NEGATIVE
GLUCOSE, UA: NEGATIVE mg/dL
HGB URINE DIPSTICK: NEGATIVE
KETONES UR: NEGATIVE mg/dL
Nitrite: NEGATIVE
PROTEIN: NEGATIVE mg/dL
Specific Gravity, Urine: 1.025 (ref 1.005–1.030)
Urobilinogen, UA: 0.2 mg/dL (ref 0.0–1.0)
pH: 8 (ref 5.0–8.0)

## 2014-04-05 LAB — CBC
HCT: 35.1 % — ABNORMAL LOW (ref 36.0–46.0)
Hemoglobin: 11.7 g/dL — ABNORMAL LOW (ref 12.0–15.0)
MCH: 30.2 pg (ref 26.0–34.0)
MCHC: 33.3 g/dL (ref 30.0–36.0)
MCV: 90.7 fL (ref 78.0–100.0)
PLATELETS: 327 10*3/uL (ref 150–400)
RBC: 3.87 MIL/uL (ref 3.87–5.11)
RDW: 13.4 % (ref 11.5–15.5)
WBC: 7.5 10*3/uL (ref 4.0–10.5)

## 2014-04-05 LAB — BASIC METABOLIC PANEL
Anion gap: 10 (ref 5–15)
BUN: 6 mg/dL (ref 6–23)
CALCIUM: 8.8 mg/dL (ref 8.4–10.5)
CO2: 22 mEq/L (ref 19–32)
Chloride: 111 mEq/L (ref 96–112)
Creatinine, Ser: 0.79 mg/dL (ref 0.50–1.10)
Glucose, Bld: 80 mg/dL (ref 70–99)
Potassium: 4.1 mEq/L (ref 3.7–5.3)
Sodium: 143 mEq/L (ref 137–147)

## 2014-04-05 LAB — URINE MICROSCOPIC-ADD ON

## 2014-04-05 LAB — POC URINE PREG, ED: Preg Test, Ur: NEGATIVE

## 2014-04-05 MED ORDER — DIPHENHYDRAMINE HCL 25 MG PO TABS: 25.0000 mg | ORAL_TABLET | Freq: Four times a day (QID) | ORAL | Status: DC

## 2014-04-05 MED ORDER — DIAZEPAM 5 MG PO TABS: 5.0000 mg | ORAL_TABLET | Freq: Three times a day (TID) | ORAL | Status: DC | PRN

## 2014-04-05 NOTE — ED Notes (Signed)
Pt c/o muscle spasms onset last night. Feet and hand swelling x 4 days. And dizziness x 12 hours.

## 2014-04-05 NOTE — Discharge Instructions (Signed)
Benadryl for possible allergic reaction. Valium for spasms. Follow up with primary care doctor for further work up. Your lab tests and urine analysis all normal today.

## 2014-04-05 NOTE — ED Provider Notes (Signed)
CSN: [id number]     Arrival date & time 04/05/14  1016 History   First MD Initiated Contact with Patient 04/05/14 1112     Chief Complaint  Patient presents with  . Spasms  . Dizziness  . Joint Swelling     (Consider location/radiation/quality/duration/timing/severity/associated sxs/prior Treatment) HPI Dana Hughes is a 33 y.o. female with hx of anxiety, depression, chronic back pain, presents to ED with complaint of muscle spasms, feet and hand swelling, dizziness, back pain. Pt states her symptoms come and go. States this time they started 4 days ago, and dizziness just started last night. States was seen for this few weeks ago, was given flexeril and ibuprofen which she states is not helping. Pt denies fevers or chills. No recent travel, surgeries, trauma. No joint swelling. No high sodium containing foods. No change in medications. Denies pregnancy. No PCP at this time.   Past Medical History  Diagnosis Date  . Hypertension     no longer takes meds  . Asthma   . Anxiety   . Depression   . Headache(784.0)     migraines  . Muscle spasm   . Scoliosis    Past Surgical History  Procedure Laterality Date  . Cholecystectomy    . Tubal ligation      pt has had pregnancy after and had  miscarriage   Family History  Problem Relation Age of Onset  . Cancer Father   . Hypertension Maternal Aunt   . Cancer Paternal Grandmother    History  Substance Use Topics  . Smoking status: Former Research scientist (life sciences)  . Smokeless tobacco: Not on file  . Alcohol Use: No   OB History    Gravida Para Term Preterm AB TAB SAB Ectopic Multiple Living   4 3 3  0 1 0 1 0 0 3     Review of Systems  Constitutional: Negative for fever and chills.  Respiratory: Negative for cough, chest tightness and shortness of breath.   Cardiovascular: Negative for chest pain and palpitations.  Gastrointestinal: Negative for nausea, vomiting, abdominal pain and diarrhea.  Genitourinary: Negative for dysuria, flank  pain, vaginal bleeding, vaginal discharge, vaginal pain and pelvic pain.  Musculoskeletal: Positive for myalgias, back pain and arthralgias. Negative for joint swelling, neck pain and neck stiffness.  Skin: Negative for rash.  Neurological: Negative for dizziness, weakness and headaches.  All other systems reviewed and are negative.     Allergies  Penicillins and Percocet  Home Medications   Prior to Admission medications   Medication Sig Start Date End Date Taking? Authorizing Provider  albuterol (PROVENTIL HFA;VENTOLIN HFA) 108 (90 BASE) MCG/ACT inhaler Inhale 1-2 puffs into the lungs every 6 (six) hours as needed for wheezing or shortness of breath. 12/19/13   Darrelyn Hillock Rasch, NP  cyclobenzaprine (FLEXERIL) 10 MG tablet Take 1 tablet (10 mg total) by mouth 2 (two) times daily as needed for muscle spasms. 02/16/14   Norman Herrlich, NP  ibuprofen (ADVIL,MOTRIN) 200 MG tablet Take 400 mg by mouth every 4 (four) hours as needed for mild pain or moderate pain.    Historical Provider, MD  naproxen (NAPROSYN) 500 MG tablet Take 1 tablet (500 mg total) by mouth 2 (two) times daily. 02/16/14   Norman Herrlich, NP   BP 123/83 mmHg  Pulse 69  Temp(Src) 97.6 F (36.4 C) (Oral)  Resp 18  Ht 5' 2.5" (1.588 m)  Wt 201 lb (91.173 kg)  BMI 36.15 kg/m2  SpO2 100%  LMP 04/01/2014 Physical Exam  Constitutional: She appears well-developed and well-nourished. No distress.  HENT:  Head: Normocephalic.  Eyes: Conjunctivae are normal.  Neck: Neck supple.  Cardiovascular: Normal rate, regular rhythm and normal heart sounds.   Pulmonary/Chest: Effort normal and breath sounds normal. No respiratory distress. She has no wheezes. She has no rales.  Abdominal: Soft. Bowel sounds are normal. She exhibits no distension. There is no tenderness. There is no rebound.  Musculoskeletal: She exhibits no edema.  Mild swelling noted in hands. No erythema or any other discoloration. Normal forearms. Radial  pulses intact. Normal ankles and feet.   Neurological: She is alert.  Skin: Skin is warm and dry.  Psychiatric: She has a normal mood and affect. Her behavior is normal.  Nursing note and vitals reviewed.   ED Course  Procedures (including critical care time) Labs Review Labs Reviewed  CBC - Abnormal; Notable for the following:    Hemoglobin 11.7 (*)    HCT 35.1 (*)    All other components within normal limits  URINALYSIS, ROUTINE W REFLEX MICROSCOPIC - Abnormal; Notable for the following:    Leukocytes, UA TRACE (*)    All other components within normal limits  BASIC METABOLIC PANEL  URINE MICROSCOPIC-ADD ON  POC URINE PREG, ED    Imaging Review No results found.   EKG Interpretation None      MDM   Final diagnoses:  Bilateral hand swelling  Myalgia    patient with muscle spasms, joint aches, swelling in the hands and feet. Does not have a primary care doctor, does have Medicaid. Spoke with case management, they provided her with a list of providers who take Medicaid. We'll check some blood tests, urinalysis, monitor.   12:59 PM Patient's blood work is normal including renal function, electrolytes, blood counts. Her vital signs are normal. Urinalysis and urine pregnancies are negative. Patient appears very anxious, she is expressing her concern about having possible hypertension, diabetes, she is worried about what else could be causing her symptoms. I did suggest to her she may be having an allergic reaction given her a swelling is only in her hands and feet. Advised her to try to take some Benadryl. Also did have a history of arthralgias, suggested her to follow with her primary care doctor for possible autoimmune workup. Patient agrees with the plan. Will discharge home with muscle spasm medications, Benadryl, follow up with primary care doctor    Renold Genta, PA-C 04/05/14 [place], MD 04/05/14 [phone]

## 2014-04-05 NOTE — Discharge Planning (Signed)
Dana Hughes J. Clydene Laming, RN, [place], [place] [phone] ED CM consulted to meet with patient concerning f/u care with PCP and patient has Medicaid. Pt presented to [facility] ED today with spasms, dizziness and joint swelling.  Met with patient at bedside, confirmed informaton. Pt  reports not having access to f/u care with PCP. Discussed with patient importance and benefits of  establishing PCP, and not utilizing the ED for primary care needs. Pt verbalized understanding and is in agreement. Discussed other options, provided list of local  affordable PCPs.  Pt voiced  Interest in the [facility] and [place].  [facility] Brochure given with address, phone number, and the services highlighted. Instructed to call or walk in Monday - Friday between the hours of 9- 5:30p to schedule appt for establishing care for PCP. Pt verbalized understanding.

## 2014-05-20 ENCOUNTER — Inpatient Hospital Stay (HOSPITAL_COMMUNITY)
Admission: AD | Admit: 2014-05-20 | Discharge: 2014-05-20 | Disposition: A | Discharge status: Home / Self Care | Payer: Medicaid Other | Source: Ambulatory Visit | Attending: Obstetrics & Gynecology | Admitting: Obstetrics & Gynecology

## 2014-05-20 ENCOUNTER — Encounter (HOSPITAL_COMMUNITY): Payer: Self-pay | Admitting: *Deleted

## 2014-05-20 DIAGNOSIS — N760 Acute vaginitis: Secondary | ICD-10-CM | POA: Insufficient documentation

## 2014-05-20 DIAGNOSIS — B9689 Other specified bacterial agents as the cause of diseases classified elsewhere: Secondary | ICD-10-CM | POA: Insufficient documentation

## 2014-05-20 DIAGNOSIS — A499 Bacterial infection, unspecified: Secondary | ICD-10-CM

## 2014-05-20 DIAGNOSIS — R102 Pelvic and perineal pain: Secondary | ICD-10-CM

## 2014-05-20 DIAGNOSIS — R109 Unspecified abdominal pain: Secondary | ICD-10-CM | POA: Insufficient documentation

## 2014-05-20 DIAGNOSIS — Z87891 Personal history of nicotine dependence: Secondary | ICD-10-CM | POA: Insufficient documentation

## 2014-05-20 DIAGNOSIS — N949 Unspecified condition associated with female genital organs and menstrual cycle: Secondary | ICD-10-CM | POA: Insufficient documentation

## 2014-05-20 LAB — CBC WITH DIFFERENTIAL/PLATELET
BASOS PCT: 0 % (ref 0–1)
Basophils Absolute: 0 10*3/uL (ref 0.0–0.1)
EOS ABS: 0.2 10*3/uL (ref 0.0–0.7)
EOS PCT: 2 % (ref 0–5)
HEMATOCRIT: 31.8 % — AB (ref 36.0–46.0)
Hemoglobin: 10.6 g/dL — ABNORMAL LOW (ref 12.0–15.0)
LYMPHS ABS: 5 10*3/uL — AB (ref 0.7–4.0)
Lymphocytes Relative: 50 % — ABNORMAL HIGH (ref 12–46)
MCH: 29.9 pg (ref 26.0–34.0)
MCHC: 33.3 g/dL (ref 30.0–36.0)
MCV: 89.8 fL (ref 78.0–100.0)
Monocytes Absolute: 0.6 10*3/uL (ref 0.1–1.0)
Monocytes Relative: 6 % (ref 3–12)
Neutro Abs: 4.3 10*3/uL (ref 1.7–7.7)
Neutrophils Relative %: 42 % — ABNORMAL LOW (ref 43–77)
Platelets: 335 10*3/uL (ref 150–400)
RBC: 3.54 MIL/uL — ABNORMAL LOW (ref 3.87–5.11)
RDW: 13.8 % (ref 11.5–15.5)
WBC: 10.2 10*3/uL (ref 4.0–10.5)

## 2014-05-20 LAB — WET PREP, GENITAL
TRICH WET PREP: NONE SEEN
YEAST WET PREP: NONE SEEN

## 2014-05-20 LAB — URINE MICROSCOPIC-ADD ON

## 2014-05-20 LAB — URINALYSIS, ROUTINE W REFLEX MICROSCOPIC
BILIRUBIN URINE: NEGATIVE
Glucose, UA: NEGATIVE mg/dL
HGB URINE DIPSTICK: NEGATIVE
Ketones, ur: NEGATIVE mg/dL
Nitrite: NEGATIVE
PH: 6 (ref 5.0–8.0)
Protein, ur: NEGATIVE mg/dL
Specific Gravity, Urine: 1.02 (ref 1.005–1.030)
UROBILINOGEN UA: 0.2 mg/dL (ref 0.0–1.0)

## 2014-05-20 LAB — BASIC METABOLIC PANEL
ANION GAP: 3 — AB (ref 5–15)
BUN: 14 mg/dL (ref 6–23)
CO2: 25 mmol/L (ref 19–32)
CREATININE: 0.75 mg/dL (ref 0.50–1.10)
Calcium: 8.9 mg/dL (ref 8.4–10.5)
Chloride: 111 mmol/L (ref 96–112)
GFR calc non Af Amer: >90 mL/min (ref >90)
Glucose, Bld: 89 mg/dL (ref 70–99)
POTASSIUM: 3.8 mmol/L (ref 3.5–5.1)
SODIUM: 139 mmol/L (ref 135–145)

## 2014-05-20 LAB — POCT PREGNANCY, URINE: Preg Test, Ur: NEGATIVE

## 2014-05-20 MED ORDER — METRONIDAZOLE 500 MG PO TABS: 500.0000 mg | ORAL_TABLET | Freq: Two times a day (BID) | ORAL | Status: DC

## 2014-05-20 NOTE — MAU Note (Signed)
Pt states she has been having frequent urine and back pan and pain on her right side and around pt's belly button

## 2014-05-20 NOTE — MAU Provider Note (Signed)
CSN: [id number]     Arrival date & time 05/20/14  1819 History   None    Chief Complaint  Patient presents with  . Abdominal Pain  . Urinary Frequency     (Consider location/radiation/quality/duration/timing/severity/associated sxs/prior Treatment) Patient is a 34 y.o. female presenting with abdominal pain, frequency, and pelvic pain. The history is provided by the patient.  Abdominal Pain The primary symptoms of the illness include abdominal pain and nausea. The primary symptoms of the illness do not include fever, vomiting or dysuria. The current episode started more than 2 days ago. The onset of the illness was gradual.  The patient states that she believes she is currently not pregnant. The patient has not had a change in bowel habit. Additional symptoms associated with the illness include frequency. Symptoms associated with the illness do not include chills, anorexia, heartburn or constipation. Back pain: chronic back pain.  Urinary Frequency Associated symptoms include abdominal pain and nausea. Pertinent negatives include no anorexia, chest pain, chills, coughing, fever, headaches, neck pain or vomiting.  Pelvic Pain This is a new problem. The current episode started 1 to 4 weeks ago. The problem occurs intermittently. The problem has been gradually worsening. Associated symptoms include abdominal pain and nausea. Pertinent negatives include no anorexia, chest pain, chills, coughing, fever, headaches, neck pain or vomiting.   Dana Hughes is a 34 y.o. [phone] who presents to the MAU with pelvic pain and urinary frequency and vaginal discharge. She denies dysuria or urgency. She has had the pain off and on for several weeks. The pain comes and goes. Currently she has no pain. She also states that she has been having a headache sometimes that is relieved with tylenol or ibuprofen. She has chronic back pain and scoliosis.   Past Medical History  Diagnosis Date  . Hypertension     no  longer takes meds  . Asthma   . Anxiety   . Depression   . Headache(784.0)     migraines  . Muscle spasm   . Scoliosis    Past Surgical History  Procedure Laterality Date  . Cholecystectomy    . Tubal ligation      pt has had pregnancy after and had  miscarriage   Family History  Problem Relation Age of Onset  . Cancer Father   . Hypertension Maternal Aunt   . Cancer Paternal Grandmother    History  Substance Use Topics  . Smoking status: Former Research scientist (life sciences)  . Smokeless tobacco: Not on file  . Alcohol Use: No   OB History    Gravida Para Term Preterm AB TAB SAB Ectopic Multiple Living   4 3 3  0 1 0 1 0 0 3     Review of Systems  Constitutional: Negative for fever and chills.  Eyes: Negative for visual disturbance.  Respiratory: Negative for cough, choking and wheezing.   Cardiovascular: Negative for chest pain and palpitations.  Gastrointestinal: Positive for nausea and abdominal pain. Negative for heartburn, vomiting, constipation and anorexia.  Genitourinary: Positive for frequency and pelvic pain. Negative for dysuria.  Musculoskeletal: Negative for neck pain. Back pain: chronic back pain.  Neurological: Negative for headaches.       Has had headaches off and on but denies headache now.   Psychiatric/Behavioral: Negative for confusion. The patient is nervous/anxious (dx with depression but not on any medication).       Allergies  Penicillins and Percocet  Home Medications   Prior to Admission medications  Medication Sig Start Date End Date Taking? Authorizing Provider  albuterol (PROVENTIL HFA;VENTOLIN HFA) 108 (90 BASE) MCG/ACT inhaler Inhale 1-2 puffs into the lungs every 6 (six) hours as needed for wheezing or shortness of breath. 12/19/13   Darrelyn Hillock Rasch, NP  cyclobenzaprine (FLEXERIL) 10 MG tablet Take 1 tablet (10 mg total) by mouth 2 (two) times daily as needed for muscle spasms. 02/16/14   Norman Herrlich, NP  diazepam (VALIUM) 5 MG tablet Take 1  tablet (5 mg total) by mouth every 8 (eight) hours as needed for anxiety. 04/05/14   Tatyana A Kirichenko, PA-C  diphenhydrAMINE (BENADRYL) 25 MG tablet Take 1 tablet (25 mg total) by mouth every 6 (six) hours. 04/05/14   Tatyana A Kirichenko, PA-C  ibuprofen (ADVIL,MOTRIN) 200 MG tablet Take 400 mg by mouth every 4 (four) hours as needed for mild pain or moderate pain.    Historical Provider, MD  metroNIDAZOLE (FLAGYL) 500 MG tablet Take 1 tablet (500 mg total) by mouth 2 (two) times daily. 05/20/14   Lin Glazier Bunnie Pion, NP  naproxen (NAPROSYN) 500 MG tablet Take 1 tablet (500 mg total) by mouth 2 (two) times daily. 02/16/14   Norman Herrlich, NP   BP 130/85 mmHg  Pulse 74  Temp(Src) 98.7 F (37.1 C) (Oral)  Resp 18  Ht 5' 2.2" (1.58 m)  Wt 209 lb (94.802 kg)  BMI 37.98 kg/m2  LMP 04/28/2014 Physical Exam  Constitutional: She is oriented to person, place, and time. She appears well-developed and well-nourished.  HENT:  Head: Normocephalic.  Eyes: EOM are normal.  Neck: Neck supple.  Cardiovascular: Normal rate.   Pulmonary/Chest: Effort normal and breath sounds normal.  Abdominal: Soft. Bowel sounds are normal. There is no tenderness. There is no CVA tenderness.  Genitourinary:  External genitalia without lesions, frothy malodorous d/c vaginal vault. No CMT, no adnexal tenderness. Unable to palpate uterus due to patient habitus.   Musculoskeletal: Normal range of motion.  Neurological: She is alert and oriented to person, place, and time. No cranial nerve deficit.  Skin: Skin is warm and dry.  Psychiatric: She has a normal mood and affect. Her behavior is normal.  Nursing note and vitals reviewed.   ED Course  Procedures (including critical care time) Labs Review Results for orders placed or performed during the hospital encounter of 05/20/14 (from the past 24 hour(s))  Urinalysis, Routine w reflex microscopic     Status: Abnormal   Collection Time: 05/20/14  6:30 PM  Result Value  Ref Range   Color, Urine YELLOW YELLOW   APPearance CLEAR CLEAR   Specific Gravity, Urine 1.020 1.005 - 1.030   pH 6.0 5.0 - 8.0   Glucose, UA NEGATIVE NEGATIVE mg/dL   Hgb urine dipstick NEGATIVE NEGATIVE   Bilirubin Urine NEGATIVE NEGATIVE   Ketones, ur NEGATIVE NEGATIVE mg/dL   Protein, ur NEGATIVE NEGATIVE mg/dL   Urobilinogen, UA 0.2 0.0 - 1.0 mg/dL   Nitrite NEGATIVE NEGATIVE   Leukocytes, UA TRACE (A) NEGATIVE  Urine microscopic-add on     Status: None   Collection Time: 05/20/14  6:30 PM  Result Value Ref Range   Squamous Epithelial / LPF RARE RARE   WBC, UA 0-2 <3 WBC/hpf   RBC / HPF 0-2 <3 RBC/hpf   Bacteria, UA RARE RARE   Urine-Other AMORPHOUS URATES/PHOSPHATES   Pregnancy, urine POC     Status: None   Collection Time: 05/20/14  7:00 PM  Result Value Ref Range   Preg  Test, Ur NEGATIVE NEGATIVE  CBC with Differential/Platelet     Status: Abnormal   Collection Time: 05/20/14  7:55 PM  Result Value Ref Range   WBC 10.2 4.0 - 10.5 K/uL   RBC 3.54 (L) 3.87 - 5.11 MIL/uL   Hemoglobin 10.6 (L) 12.0 - 15.0 g/dL   HCT 31.8 (L) 36.0 - 46.0 %   MCV 89.8 78.0 - 100.0 fL   MCH 29.9 26.0 - 34.0 pg   MCHC 33.3 30.0 - 36.0 g/dL   RDW 13.8 11.5 - 15.5 %   Platelets 335 150 - 400 K/uL   Neutrophils Relative % 42 (L) 43 - 77 %   Neutro Abs 4.3 1.7 - 7.7 K/uL   Lymphocytes Relative 50 (H) 12 - 46 %   Lymphs Abs 5.0 (H) 0.7 - 4.0 K/uL   Monocytes Relative 6 3 - 12 %   Monocytes Absolute 0.6 0.1 - 1.0 K/uL   Eosinophils Relative 2 0 - 5 %   Eosinophils Absolute 0.2 0.0 - 0.7 K/uL   Basophils Relative 0 0 - 1 %   Basophils Absolute 0.0 0.0 - 0.1 K/uL  Basic metabolic panel     Status: Abnormal   Collection Time: 05/20/14  7:55 PM  Result Value Ref Range   Sodium 139 135 - 145 mmol/L   Potassium 3.8 3.5 - 5.1 mmol/L   Chloride 111 96 - 112 mmol/L   CO2 25 19 - 32 mmol/L   Glucose, Bld 89 70 - 99 mg/dL   BUN 14 6 - 23 mg/dL   Creatinine, Ser 0.75 0.50 - 1.10 mg/dL    Calcium 8.9 8.4 - 10.5 mg/dL   GFR calc non Af Amer >90 >90 mL/min   GFR calc Af Amer >90 >90 mL/min   Anion gap 3 (L) 5 - 15  Wet prep, genital     Status: Abnormal   Collection Time: 05/20/14  8:02 PM  Result Value Ref Range   Yeast Wet Prep HPF POC NONE SEEN NONE SEEN   Trich, Wet Prep NONE SEEN NONE SEEN   Clue Cells Wet Prep HPF POC MANY (A) NONE SEEN   WBC, Wet Prep HPF POC MODERATE (A) NONE SEEN     MDM  34 y.o. female with pelvic pain off and on but none at this time and occasional headache that has been relieved with tylenol or ibuprofen. Stable for d/c without acute abdomen. Will treat for BV. She is to follow up with the [facility]. She will return here as needed.  Final diagnoses:  Bacterial vaginosis  Pelvic pain in female

## 2014-05-20 NOTE — Progress Notes (Signed)
Pt left prior to receiving results or discharge instructions

## 2014-05-20 NOTE — MAU Note (Signed)
Pt reports she has been having abd pain and frequent urination for about 2 months. Went to [facility] for the same thing no problem found. Pt still having symptoms.

## 2014-05-20 NOTE — MAU Note (Signed)
Provider out to lobby to discharge pt but pt was not in the lobby.

## 2014-05-20 NOTE — Discharge Instructions (Signed)
Follow up with our GYN office for your pap smear and further evaluation if you pain returns.   Bacterial Vaginosis Bacterial vaginosis is a vaginal infection that occurs when the normal balance of bacteria in the vagina is disrupted. It results from an overgrowth of certain bacteria. This is the most common vaginal infection in women of childbearing age. Treatment is important to prevent complications, especially in pregnant women, as it can cause a premature delivery. CAUSES  Bacterial vaginosis is caused by an increase in harmful bacteria that are normally present in smaller amounts in the vagina. Several different kinds of bacteria can cause bacterial vaginosis. However, the reason that the condition develops is not fully understood. RISK FACTORS Certain activities or behaviors can put you at an increased risk of developing bacterial vaginosis, including:  Having a new sex partner or multiple sex partners.  Douching.  Using an intrauterine device (IUD) for contraception. Women do not get bacterial vaginosis from toilet seats, bedding, swimming pools, or contact with objects around them. SIGNS AND SYMPTOMS  Some women with bacterial vaginosis have no signs or symptoms. Common symptoms include:  Grey vaginal discharge.  A fishlike odor with discharge, especially after sexual intercourse.  Itching or burning of the vagina and vulva.  Burning or pain with urination. DIAGNOSIS  Your health care provider will take a medical history and examine the vagina for signs of bacterial vaginosis. A sample of vaginal fluid may be taken. Your health care provider will look at this sample under a microscope to check for bacteria and abnormal cells. A vaginal pH test may also be done.  TREATMENT  Bacterial vaginosis may be treated with antibiotic medicines. These may be given in the form of a pill or a vaginal cream. A second round of antibiotics may be prescribed if the condition comes back after  treatment.  HOME CARE INSTRUCTIONS   Only take over-the-counter or prescription medicines as directed by your health care provider.  If antibiotic medicine was prescribed, take it as directed. Make sure you finish it even if you start to feel better.  Do not have sex until treatment is completed.  Tell all sexual partners that you have a vaginal infection. They should see their health care provider and be treated if they have problems, such as a mild rash or itching.  Practice safe sex by using condoms and only having one sex partner. SEEK MEDICAL CARE IF:   Your symptoms are not improving after 3 days of treatment.  You have increased discharge or pain.  You have a fever. MAKE SURE YOU:   Understand these instructions.  Will watch your condition.  Will get help right away if you are not doing well or get worse. FOR MORE INFORMATION  Centers for Disease Control and Prevention, Division of STD Prevention: [employer]raiser[employer]raud[employer] American Sexual Health Association (ASHA): www.ashastd.org  Document Released: 04/09/2005 Document Revised: 01/28/2013 Document Reviewed: 11/19/2012 [facility] Patient Information 2015 [place], [place]. This information is not intended to replace advice given to you by your health care provider. Make sure you discuss any questions you have with your health care provider.

## 2014-05-21 LAB — GC/CHLAMYDIA PROBE AMP (~~LOC~~) NOT AT ARMC
Chlamydia: NEGATIVE
Neisseria Gonorrhea: NEGATIVE

## 2014-06-05 ENCOUNTER — Emergency Department (HOSPITAL_COMMUNITY)
Admission: EM | Admit: 2014-06-05 | Discharge: 2014-06-05 | Disposition: A | Discharge status: Home / Self Care | Payer: Self-pay | Attending: Emergency Medicine | Admitting: Emergency Medicine

## 2014-06-05 ENCOUNTER — Encounter (HOSPITAL_COMMUNITY): Payer: Self-pay | Admitting: Emergency Medicine

## 2014-06-05 ENCOUNTER — Emergency Department (HOSPITAL_COMMUNITY): Payer: Self-pay

## 2014-06-05 DIAGNOSIS — R51 Headache: Secondary | ICD-10-CM

## 2014-06-05 DIAGNOSIS — Z792 Long term (current) use of antibiotics: Secondary | ICD-10-CM | POA: Insufficient documentation

## 2014-06-05 DIAGNOSIS — Z880 Allergy status to penicillin: Secondary | ICD-10-CM | POA: Insufficient documentation

## 2014-06-05 DIAGNOSIS — R519 Headache, unspecified: Secondary | ICD-10-CM

## 2014-06-05 DIAGNOSIS — G43909 Migraine, unspecified, not intractable, without status migrainosus: Secondary | ICD-10-CM | POA: Insufficient documentation

## 2014-06-05 DIAGNOSIS — Z87891 Personal history of nicotine dependence: Secondary | ICD-10-CM | POA: Insufficient documentation

## 2014-06-05 DIAGNOSIS — F419 Anxiety disorder, unspecified: Secondary | ICD-10-CM | POA: Insufficient documentation

## 2014-06-05 DIAGNOSIS — Z3202 Encounter for pregnancy test, result negative: Secondary | ICD-10-CM | POA: Insufficient documentation

## 2014-06-05 DIAGNOSIS — M419 Scoliosis, unspecified: Secondary | ICD-10-CM | POA: Insufficient documentation

## 2014-06-05 DIAGNOSIS — I10 Essential (primary) hypertension: Secondary | ICD-10-CM | POA: Insufficient documentation

## 2014-06-05 DIAGNOSIS — J45909 Unspecified asthma, uncomplicated: Secondary | ICD-10-CM | POA: Insufficient documentation

## 2014-06-05 DIAGNOSIS — N760 Acute vaginitis: Secondary | ICD-10-CM | POA: Insufficient documentation

## 2014-06-05 DIAGNOSIS — Z791 Long term (current) use of non-steroidal anti-inflammatories (NSAID): Secondary | ICD-10-CM | POA: Insufficient documentation

## 2014-06-05 DIAGNOSIS — B9689 Other specified bacterial agents as the cause of diseases classified elsewhere: Secondary | ICD-10-CM

## 2014-06-05 DIAGNOSIS — Z79899 Other long term (current) drug therapy: Secondary | ICD-10-CM | POA: Insufficient documentation

## 2014-06-05 DIAGNOSIS — R109 Unspecified abdominal pain: Secondary | ICD-10-CM

## 2014-06-05 LAB — CBC WITH DIFFERENTIAL/PLATELET
Basophils Absolute: 0 10*3/uL (ref 0.0–0.1)
Basophils Relative: 0 % (ref 0–1)
Eosinophils Absolute: 0.2 10*3/uL (ref 0.0–0.7)
Eosinophils Relative: 2 % (ref 0–5)
HCT: 33.6 % — ABNORMAL LOW (ref 36.0–46.0)
Hemoglobin: 10.9 g/dL — ABNORMAL LOW (ref 12.0–15.0)
LYMPHS ABS: 3.1 10*3/uL (ref 0.7–4.0)
Lymphocytes Relative: 39 % (ref 12–46)
MCH: 28.8 pg (ref 26.0–34.0)
MCHC: 32.4 g/dL (ref 30.0–36.0)
MCV: 88.7 fL (ref 78.0–100.0)
MONO ABS: 0.5 10*3/uL (ref 0.1–1.0)
Monocytes Relative: 7 % (ref 3–12)
NEUTROS PCT: 52 % (ref 43–77)
Neutro Abs: 4.1 10*3/uL (ref 1.7–7.7)
PLATELETS: 390 10*3/uL (ref 150–400)
RBC: 3.79 MIL/uL — AB (ref 3.87–5.11)
RDW: 13.6 % (ref 11.5–15.5)
WBC: 7.9 10*3/uL (ref 4.0–10.5)

## 2014-06-05 LAB — WET PREP, GENITAL
TRICH WET PREP: NONE SEEN
YEAST WET PREP: NONE SEEN

## 2014-06-05 LAB — BASIC METABOLIC PANEL
ANION GAP: 6 (ref 5–15)
CALCIUM: 8.4 mg/dL (ref 8.4–10.5)
CO2: 26 mmol/L (ref 19–32)
Chloride: 105 mmol/L (ref 96–112)
Creatinine, Ser: 0.72 mg/dL (ref 0.50–1.10)
GFR calc Af Amer: >90 mL/min (ref >90)
GFR calc non Af Amer: >90 mL/min (ref >90)
GLUCOSE: 78 mg/dL (ref 70–99)
Potassium: 3.9 mmol/L (ref 3.5–5.1)
Sodium: 137 mmol/L (ref 135–145)

## 2014-06-05 LAB — URINALYSIS, ROUTINE W REFLEX MICROSCOPIC
BILIRUBIN URINE: NEGATIVE
Glucose, UA: NEGATIVE mg/dL
Hgb urine dipstick: NEGATIVE
Ketones, ur: NEGATIVE mg/dL
Leukocytes, UA: NEGATIVE
Nitrite: NEGATIVE
Protein, ur: NEGATIVE mg/dL
SPECIFIC GRAVITY, URINE: 1.009 (ref 1.005–1.030)
Urobilinogen, UA: 0.2 mg/dL (ref 0.0–1.0)
pH: 6.5 (ref 5.0–8.0)

## 2014-06-05 LAB — POC URINE PREG, ED: Preg Test, Ur: NEGATIVE

## 2014-06-05 MED ORDER — MELOXICAM 7.5 MG PO TABS: 15.0000 mg | ORAL_TABLET | Freq: Every day | ORAL | Status: DC

## 2014-06-05 MED ORDER — METOCLOPRAMIDE HCL 5 MG/ML IJ SOLN: 10.0000 mg | Freq: Once | INTRAMUSCULAR | Status: DC

## 2014-06-05 MED ORDER — KETOROLAC TROMETHAMINE 30 MG/ML IJ SOLN
30.0000 mg | Freq: Once | INTRAMUSCULAR | Status: AC
  Administered 2014-06-05: 30 mg via INTRAVENOUS
  Filled 2014-06-05: qty 1

## 2014-06-05 MED ORDER — ONDANSETRON 4 MG PO TBDP: 4.0000 mg | ORAL_TABLET | Freq: Three times a day (TID) | ORAL | Status: DC | PRN

## 2014-06-05 MED ORDER — METOCLOPRAMIDE HCL 5 MG/ML IJ SOLN
10.0000 mg | Freq: Once | INTRAMUSCULAR | Status: AC
  Administered 2014-06-05: 10 mg via INTRAVENOUS
  Filled 2014-06-05: qty 2

## 2014-06-05 MED ORDER — DIPHENHYDRAMINE HCL 50 MG/ML IJ SOLN: 25.0000 mg | Freq: Once | INTRAMUSCULAR | Status: DC

## 2014-06-05 MED ORDER — DIPHENHYDRAMINE HCL 50 MG/ML IJ SOLN
25.0000 mg | Freq: Once | INTRAMUSCULAR | Status: AC
  Administered 2014-06-05: 25 mg via INTRAVENOUS
  Filled 2014-06-05: qty 1

## 2014-06-05 MED ORDER — METRONIDAZOLE 500 MG PO TABS: 500.0000 mg | ORAL_TABLET | Freq: Two times a day (BID) | ORAL | Status: DC

## 2014-06-05 NOTE — Discharge Instructions (Signed)
Please follow up with your primary care physician in 1-2 days. If you do not have one please call the [place] number listed above. Please follow up with the [facility] to schedule a follow up appointment.  Please take your antibiotic until completion. Please read all discharge instructions and return precautions.    Abdominal Pain, Women Abdominal (stomach, pelvic, or belly) pain can be caused by many things. It is important to tell your doctor:  The location of the pain.  Does it come and go or is it present all the time?  Are there things that start the pain (eating certain foods, exercise)?  Are there other symptoms associated with the pain (fever, nausea, vomiting, diarrhea)? All of this is helpful to know when trying to find the cause of the pain. CAUSES   Stomach: virus or bacteria infection, or ulcer.  Intestine: appendicitis (inflamed appendix), regional ileitis (Crohn's disease), ulcerative colitis (inflamed colon), irritable bowel syndrome, diverticulitis (inflamed diverticulum of the colon), or cancer of the stomach or intestine.  Gallbladder disease or stones in the gallbladder.  Kidney disease, kidney stones, or infection.  Pancreas infection or cancer.  Fibromyalgia (pain disorder).  Diseases of the female organs:  Uterus: fibroid (non-cancerous) tumors or infection.  Fallopian tubes: infection or tubal pregnancy.  Ovary: cysts or tumors.  Pelvic adhesions (scar tissue).  Endometriosis (uterus lining tissue growing in the pelvis and on the pelvic organs).  Pelvic congestion syndrome (female organs filling up with blood just before the menstrual period).  Pain with the menstrual period.  Pain with ovulation (producing an egg).  Pain with an IUD (intrauterine device, birth control) in the uterus.  Cancer of the female organs.  Functional pain (pain not caused by a disease, may improve without  treatment).  Psychological pain.  Depression. DIAGNOSIS  Your doctor will decide the seriousness of your pain by doing an examination.  Blood tests.  X-rays.  Ultrasound.  CT scan (computed tomography, special type of X-ray).  MRI (magnetic resonance imaging).  Cultures, for infection.  Barium enema (dye inserted in the large intestine, to better view it with X-rays).  Colonoscopy (looking in intestine with a lighted tube).  Laparoscopy (minor surgery, looking in abdomen with a lighted tube).  Major abdominal exploratory surgery (looking in abdomen with a large incision). TREATMENT  The treatment will depend on the cause of the pain.   Many cases can be observed and treated at home.  Over-the-counter medicines recommended by your caregiver.  Prescription medicine.  Antibiotics, for infection.  Birth control pills, for painful periods or for ovulation pain.  Hormone treatment, for endometriosis.  Nerve blocking injections.  Physical therapy.  Antidepressants.  Counseling with a psychologist or psychiatrist.  Minor or major surgery. HOME CARE INSTRUCTIONS   Do not take laxatives, unless directed by your caregiver.  Take over-the-counter pain medicine only if ordered by your caregiver. Do not take aspirin because it can cause an upset stomach or bleeding.  Try a clear liquid diet (broth or water) as ordered by your caregiver. Slowly move to a bland diet, as tolerated, if the pain is related to the stomach or intestine.  Have a thermometer and take your temperature several times a day, and record it.  Bed rest and sleep, if it helps the pain.  Avoid sexual intercourse, if it causes pain.  Avoid stressful situations.  Keep your follow-up appointments and tests, as your caregiver orders.  If the pain does not go  away with medicine or surgery, you may try:  Acupuncture.  Relaxation exercises (yoga, meditation).  Group therapy.  Counseling. SEEK  MEDICAL CARE IF:   You notice certain foods cause stomach pain.  Your home care treatment is not helping your pain.  You need stronger pain medicine.  You want your IUD removed.  You feel faint or lightheaded.  You develop nausea and vomiting.  You develop a rash.  You are having side effects or an allergy to your medicine. SEEK IMMEDIATE MEDICAL CARE IF:   Your pain does not go away or gets worse.  You have a fever.  Your pain is felt only in portions of the abdomen. The right side could possibly be appendicitis. The left lower portion of the abdomen could be colitis or diverticulitis.  You are passing blood in your stools (bright red or black tarry stools, with or without vomiting).  You have blood in your urine.  You develop chills, with or without a fever.  You pass out. MAKE SURE YOU:   Understand these instructions.  Will watch your condition.  Will get help right away if you are not doing well or get worse. Document Released: 02/04/2007 Document Revised: 08/24/2013 Document Reviewed: 02/24/2009 [facility] Patient Information 2015 [place], [place]. This information is not intended to replace advice given to you by your health care provider. Make sure you discuss any questions you have with your health care provider. Bacterial Vaginosis Bacterial vaginosis is a vaginal infection that occurs when the normal balance of bacteria in the vagina is disrupted. It results from an overgrowth of certain bacteria. This is the most common vaginal infection in women of childbearing age. Treatment is important to prevent complications, especially in pregnant women, as it can cause a premature delivery. CAUSES  Bacterial vaginosis is caused by an increase in harmful bacteria that are normally present in smaller amounts in the vagina. Several different kinds of bacteria can cause bacterial vaginosis. However, the reason that the condition develops is not fully understood. RISK  FACTORS Certain activities or behaviors can put you at an increased risk of developing bacterial vaginosis, including:  Having a new sex partner or multiple sex partners.  Douching.  Using an intrauterine device (IUD) for contraception. Women do not get bacterial vaginosis from toilet seats, bedding, swimming pools, or contact with objects around them. SIGNS AND SYMPTOMS  Some women with bacterial vaginosis have no signs or symptoms. Common symptoms include:  Grey vaginal discharge.  A fishlike odor with discharge, especially after sexual intercourse.  Itching or burning of the vagina and vulva.  Burning or pain with urination. DIAGNOSIS  Your health care provider will take a medical history and examine the vagina for signs of bacterial vaginosis. A sample of vaginal fluid may be taken. Your health care provider will look at this sample under a microscope to check for bacteria and abnormal cells. A vaginal pH test may also be done.  TREATMENT  Bacterial vaginosis may be treated with antibiotic medicines. These may be given in the form of a pill or a vaginal cream. A second round of antibiotics may be prescribed if the condition comes back after treatment.  HOME CARE INSTRUCTIONS   Only take over-the-counter or prescription medicines as directed by your health care provider.  If antibiotic medicine was prescribed, take it as directed. Make sure you finish it even if you start to feel better.  Do not have sex until treatment is completed.  Tell all sexual partners that  you have a vaginal infection. They should see their health care provider and be treated if they have problems, such as a mild rash or itching.  Practice safe sex by using condoms and only having one sex partner. SEEK MEDICAL CARE IF:   Your symptoms are not improving after 3 days of treatment.  You have increased discharge or pain.  You have a fever. MAKE SURE YOU:   Understand these instructions.  Will watch  your condition.  Will get help right away if you are not doing well or get worse. FOR MORE INFORMATION  Centers for Disease Control and Prevention, Division of STD Prevention: [employer]raiser[employer]raud[employer] American Sexual Health Association (ASHA): www.ashastd.org  Document Released: 04/09/2005 Document Revised: 01/28/2013 Document Reviewed: 11/19/2012 [facility] Patient Information 2015 [place], [place]. This information is not intended to replace advice given to you by your health care provider. Make sure you discuss any questions you have with your health care provider.

## 2014-06-05 NOTE — ED Provider Notes (Signed)
CSN: [id number]     Arrival date & time 06/05/14  1117 History   First MD Initiated Contact with Patient 06/05/14 1119     Chief Complaint  Patient presents with  . Abdominal Pain  . Migraine     (Consider location/radiation/quality/duration/timing/severity/associated sxs/prior Treatment) HPI Comments:  Patient is a 34 year old female past medical history significant for hypertension, anxiety, depression presenting to the emergency department for 2 complaints. Patient's first complaint is one week of right lower quadrant abdominal pain, describes it as intermittent sharp pain with radiation to suprapubic and periumbilical region. Endorses associated vaginal discharge,  nausea, dysuria, urinary frequency. She has tried over-the-counter medications with no improvement of her symptoms.  Patient is also complaining about one month of intermittent episodes of throbbing bitemporal headache with radiation to back of head with no associated symptoms. She's tried over-the-counter medications with no improvement. States it feels like previous headaches.   Patient is a 34 y.o. female presenting with abdominal pain and migraines. The history is provided by the patient.  Abdominal Pain Pain location:  RLQ Pain quality: sharp   Pain radiates to:  Periumbilical region and suprapubic region Pain severity:  Moderate Onset quality:  Gradual Duration:  1 week Timing:  Intermittent Progression:  Waxing and waning Chronicity:  New Context: previous surgery   Context: not trauma   Relieved by:  Nothing Worsened by:  Palpation and urination Ineffective treatments:  Acetaminophen, NSAIDs and OTC medications Associated symptoms: dysuria, nausea and vaginal discharge   Associated symptoms: no constipation, no diarrhea, no fever, no vaginal bleeding and no vomiting   Migraine This is a new problem. The current episode started in the past 7 days. The problem occurs intermittently. The problem has been waxing and  waning. Associated symptoms include abdominal pain and nausea. Pertinent negatives include no fever or vomiting.    Past Medical History  Diagnosis Date  . Hypertension     no longer takes meds  . Asthma   . Anxiety   . Depression   . Headache(784.0)     migraines  . Muscle spasm   . Scoliosis    Past Surgical History  Procedure Laterality Date  . Cholecystectomy    . Tubal ligation      pt has had pregnancy after and had  miscarriage   Family History  Problem Relation Age of Onset  . Cancer Father   . Hypertension Maternal Aunt   . Cancer Paternal Grandmother    History  Substance Use Topics  . Smoking status: Former Research scientist (life sciences)  . Smokeless tobacco: Not on file  . Alcohol Use: No   OB History    Gravida Para Term Preterm AB TAB SAB Ectopic Multiple Living   4 3 3  0 1 0 1 0 0 3     Review of Systems  Constitutional: Negative for fever.  Gastrointestinal: Positive for nausea and abdominal pain. Negative for vomiting, diarrhea and constipation.  Genitourinary: Positive for dysuria and vaginal discharge. Negative for vaginal bleeding.  All other systems reviewed and are negative.     Allergies  Mushroom extract complex; Penicillins; and Percocet  Home Medications   Prior to Admission medications   Medication Sig Start Date End Date Taking? Authorizing Provider  albuterol (PROVENTIL HFA;VENTOLIN HFA) 108 (90 BASE) MCG/ACT inhaler Inhale 1-2 puffs into the lungs every 6 (six) hours as needed for wheezing or shortness of breath. 12/19/13  Yes Darrelyn Hillock Rasch, NP  aspirin-acetaminophen-caffeine (EXCEDRIN MIGRAINE) [phone] MG per tablet Take 2  tablets by mouth every 6 (six) hours as needed for headache.   Yes Historical Provider, MD  cyclobenzaprine (FLEXERIL) 10 MG tablet Take 1 tablet (10 mg total) by mouth 2 (two) times daily as needed for muscle spasms. 02/16/14  Yes Norman Herrlich, NP  diazepam (VALIUM) 5 MG tablet Take 1 tablet (5 mg total) by mouth every 8  (eight) hours as needed for anxiety. 04/05/14  Yes Tatyana A Kirichenko, PA-C  diphenhydrAMINE (BENADRYL) 25 MG tablet Take 1 tablet (25 mg total) by mouth every 6 (six) hours. 04/05/14  Yes Tatyana A Kirichenko, PA-C  flintstones complete (FLINTSTONES) 60 MG chewable tablet Chew 1 tablet by mouth daily.   Yes Historical Provider, MD  ibuprofen (ADVIL,MOTRIN) 200 MG tablet Take 400 mg by mouth every 4 (four) hours as needed for mild pain or moderate pain.   Yes Historical Provider, MD  metroNIDAZOLE (FLAGYL) 500 MG tablet Take 1 tablet (500 mg total) by mouth 2 (two) times daily. 05/20/14  Yes Hope Bunnie Pion, NP  Multiple Vitamin (MULTIVITAMIN WITH MINERALS) TABS tablet Take 1 tablet by mouth daily.   Yes Historical Provider, MD  naproxen (NAPROSYN) 500 MG tablet Take 1 tablet (500 mg total) by mouth 2 (two) times daily. 02/16/14  Yes Norman Herrlich, NP  meloxicam (MOBIC) 7.5 MG tablet Take 2 tablets (15 mg total) by mouth daily. 06/05/14   Eloyce Bultman L Lesly Pontarelli, PA-C  metroNIDAZOLE (FLAGYL) 500 MG tablet Take 1 tablet (500 mg total) by mouth 2 (two) times daily. 06/05/14   Azoria Abbett L Zyliah Schier, PA-C  ondansetron (ZOFRAN ODT) 4 MG disintegrating tablet Take 1 tablet (4 mg total) by mouth every 8 (eight) hours as needed for nausea or vomiting. 06/05/14   Anderson Malta L Le Faulcon, PA-C   BP 104/63 mmHg  Pulse 82  Temp(Src) 98.4 F (36.9 C) (Oral)  Resp 16  SpO2 100%  LMP 04/28/2014 Physical Exam  Constitutional: She is oriented to person, place, and time. She appears well-developed and well-nourished. No distress.  HENT:  Head: Normocephalic and atraumatic.  Right Ear: External ear normal.  Left Ear: External ear normal.  Nose: Nose normal.  Mouth/Throat: Oropharynx is clear and moist. No oropharyngeal exudate.  Eyes: Conjunctivae and EOM are normal. Pupils are equal, round, and reactive to light.  Neck: Normal range of motion. Neck supple.  Cardiovascular: Normal rate, regular rhythm, normal  heart sounds and intact distal pulses.   Pulmonary/Chest: Effort normal and breath sounds normal. No respiratory distress.  Abdominal: Soft. Normal appearance and bowel sounds are normal. She exhibits no distension. There is tenderness in the right lower quadrant and suprapubic area. There is no rigidity, no rebound, no guarding and no tenderness at McBurney's point.  Musculoskeletal: Normal range of motion. She exhibits no edema.  Neurological: She is alert and oriented to person, place, and time. She has normal strength. No cranial nerve deficit. Gait normal. GCS eye subscore is 4. GCS verbal subscore is 5. GCS motor subscore is 6.  Sensation grossly intact.  No pronator drift.  Bilateral heel-knee-shin intact.  Skin: Skin is warm and dry. She is not diaphoretic.  Nursing note and vitals reviewed.  Exam performed by Baron Sane L,  exam chaperoned Date: 06/05/2014 Pelvic exam: normal external genitalia without evidence of trauma. VULVA: normal appearing vulva with no masses, tenderness or lesion. VAGINA: normal appearing vagina with normal color and discharge, no lesions. CERVIX: normal appearing cervix without lesions, cervical motion tenderness absent, cervical os closed with out purulent discharge;  vaginal discharge - white and malodorous, Wet prep and DNA probe for chlamydia and GC obtained.   ADNEXA: normal adnexa in size, nontender and no masses UTERUS: uterus is normal size, shape, consistency and nontender.    ED Course  Procedures (including critical care time) Medications  diphenhydrAMINE (BENADRYL) injection 25 mg (25 mg Intravenous Given 06/05/14 1225)  metoCLOPramide (REGLAN) injection 10 mg (10 mg Intravenous Given 06/05/14 1225)  ketorolac (TORADOL) 30 MG/ML injection 30 mg (30 mg Intravenous Given 06/05/14 1437)    Labs Review Labs Reviewed  WET PREP, GENITAL - Abnormal; Notable for the following:    Clue Cells Wet Prep HPF POC FEW (*)    WBC, Wet Prep HPF  POC FEW (*)    All other components within normal limits  BASIC METABOLIC PANEL - Abnormal; Notable for the following:    BUN <5 (*)    All other components within normal limits  CBC WITH DIFFERENTIAL/PLATELET - Abnormal; Notable for the following:    RBC 3.79 (*)    Hemoglobin 10.9 (*)    HCT 33.6 (*)    All other components within normal limits  URINALYSIS, ROUTINE W REFLEX MICROSCOPIC  POC URINE PREG, ED  GC/CHLAMYDIA PROBE AMP ([place])    Imaging Review US Transvaginal Non-ob  06/05/2014   CLINICAL DATA:  Right pelvic pain for 3 weeks.  EXAM: TRANSABDOMINAL ULTRASOUND OF PELVIS  DOPPLER ULTRASOUND OF OVARIES  TECHNIQUE: Transabdominal ultrasound examination of the pelvis was performed including evaluation of the uterus, ovaries, adnexal regions, and pelvic cul-de-sac.  Color and duplex Doppler ultrasound was utilized to evaluate blood flow to the ovaries.  COMPARISON:  CT 11/23/2012  FINDINGS: Uterus  Measurements: 9.0 x 5.0 x 5.3 cm. No fibroids or other mass visualized.  Endometrium  Thickness: 8 mm. No focal abnormality visualized.  Right ovary  Measurements: 2.3 x 1.6 x 2.1 cm. Normal appearance/no adnexal mass.  Left ovary  Measurements: 4.0 x 2.8 x 2.4 cm. Within the left ovary there is a 2.5 x 2.1 x 2.1 cm cyst.  Pulsed Doppler evaluation demonstrates normal low-resistance arterial and venous waveforms in both ovaries.  Small amount of free fluid in the pelvis.  IMPRESSION: Normal arterial venous waveforms are demonstrated to the ovaries bilaterally.  The left ovary is prominent and contains a 2.5 cm simple cyst.  Small amount of free fluid in the pelvis.   Electronically Signed   By: Lovey Newcomer M.D.   On: 06/05/2014 13:23   US Pelvis Complete  06/05/2014   CLINICAL DATA:  Right pelvic pain for 3 weeks.  EXAM: TRANSABDOMINAL ULTRASOUND OF PELVIS  DOPPLER ULTRASOUND OF OVARIES  TECHNIQUE: Transabdominal ultrasound examination of the pelvis was performed including evaluation of the  uterus, ovaries, adnexal regions, and pelvic cul-de-sac.  Color and duplex Doppler ultrasound was utilized to evaluate blood flow to the ovaries.  COMPARISON:  CT 11/23/2012  FINDINGS: Uterus  Measurements: 9.0 x 5.0 x 5.3 cm. No fibroids or other mass visualized.  Endometrium  Thickness: 8 mm. No focal abnormality visualized.  Right ovary  Measurements: 2.3 x 1.6 x 2.1 cm. Normal appearance/no adnexal mass.  Left ovary  Measurements: 4.0 x 2.8 x 2.4 cm. Within the left ovary there is a 2.5 x 2.1 x 2.1 cm cyst.  Pulsed Doppler evaluation demonstrates normal low-resistance arterial and venous waveforms in both ovaries.  Small amount of free fluid in the pelvis.  IMPRESSION: Normal arterial venous waveforms are demonstrated to the ovaries bilaterally.  The left ovary is prominent and contains a 2.5 cm simple cyst.  Small amount of free fluid in the pelvis.   Electronically Signed   By: Lovey Newcomer M.D.   On: 06/05/2014 13:23   [place] Art/ven Flow Abd Pelv Doppler  06/05/2014   CLINICAL DATA:  Right pelvic pain for 3 weeks.  EXAM: TRANSABDOMINAL ULTRASOUND OF PELVIS  DOPPLER ULTRASOUND OF OVARIES  TECHNIQUE: Transabdominal ultrasound examination of the pelvis was performed including evaluation of the uterus, ovaries, adnexal regions, and pelvic cul-de-sac.  Color and duplex Doppler ultrasound was utilized to evaluate blood flow to the ovaries.  COMPARISON:  CT 11/23/2012  FINDINGS: Uterus  Measurements: 9.0 x 5.0 x 5.3 cm. No fibroids or other mass visualized.  Endometrium  Thickness: 8 mm. No focal abnormality visualized.  Right ovary  Measurements: 2.3 x 1.6 x 2.1 cm. Normal appearance/no adnexal mass.  Left ovary  Measurements: 4.0 x 2.8 x 2.4 cm. Within the left ovary there is a 2.5 x 2.1 x 2.1 cm cyst.  Pulsed Doppler evaluation demonstrates normal low-resistance arterial and venous waveforms in both ovaries.  Small amount of free fluid in the pelvis.  IMPRESSION: Normal arterial venous waveforms are demonstrated  to the ovaries bilaterally.  The left ovary is prominent and contains a 2.5 cm simple cyst.  Small amount of free fluid in the pelvis.   Electronically Signed   By: Lovey Newcomer M.D.   On: 06/05/2014 13:23     EKG Interpretation None      MDM   Final diagnoses:  Abdominal pain in female  Bacterial vaginosis  Bad headache    Filed Vitals:   06/05/14 1517  BP: 104/63  Pulse: 82  Temp: 98.4 F (36.9 C)  Resp: 16   Afebrile, NAD, non-toxic appearing, AAOx4.  I have reviewed nursing notes, vital signs, and all appropriate lab and imaging results for this patient.  1) Abdominal pain: Patient is nontoxic, nonseptic appearing, in no apparent distress.  Patient's pain and other symptoms adequately managed in emergency department.  Fluid bolus given.  Labs, imaging and vitals reviewed.  Patient does not meet the SIRS or Sepsis criteria.  On repeat exam patient does not have a surgical abdomen and there are no peritoneal signs.  No indication of appendicitis, bowel obstruction, bowel perforation, cholecystitis, diverticulitis, ovarian torsion PID or ectopic pregnancy.  Patient will be given prescription for Flagyl for BV advised patient not to drink on this medication. Advised OB/GYN follow-up.  2) HA: Pt HA treated and improved while in ED.  Presentation is like pts typical HA and non concerning for [facility], ICH, Meningitis, or temporal arteritis. Pt is afebrile with no focal neuro deficits, nuchal rigidity, or change in vision. Pt is to follow up with PCP to discuss prophylactic medication.    Patient discharged home with symptomatic treatment and given strict instructions for follow-up with their primary care physician.  I have also discussed reasons to return immediately to the ER.  Patient expresses understanding and agrees with plan.   Harlow Mares, PA-C 06/05/14 [place], MD 06/06/14 [phone]

## 2014-06-05 NOTE — ED Notes (Signed)
Pt arrived with family. Pt c/o right lower abd pain that started about 1 week ago. Also c/o migraine that has been going on for at least a month that come and go. Stated that she has been having irregular periods "pink spot here, pink spot there and then clots". Stated she had her tubes tide 10 years ago. Stated that she has been having discharge that is clear with "white chalking stuff but not as thick". Stated that she has been nauseated with no vomiting. Pt stated that she has had diarrhea some and constipated at time too.

## 2014-06-05 NOTE — ED Notes (Signed)
Patient transported to Ultrasound 

## 2014-06-07 LAB — GC/CHLAMYDIA PROBE AMP (~~LOC~~) NOT AT ARMC
Chlamydia: NEGATIVE
NEISSERIA GONORRHEA: NEGATIVE

## 2014-06-22 ENCOUNTER — Encounter (HOSPITAL_COMMUNITY): Payer: Self-pay | Admitting: *Deleted

## 2014-06-22 ENCOUNTER — Inpatient Hospital Stay (HOSPITAL_COMMUNITY)
Admission: AD | Admit: 2014-06-22 | Discharge: 2014-06-22 | Disposition: A | Discharge status: Home / Self Care | Payer: Self-pay | Source: Ambulatory Visit | Attending: Family Medicine | Admitting: Family Medicine

## 2014-06-22 DIAGNOSIS — I10 Essential (primary) hypertension: Secondary | ICD-10-CM | POA: Insufficient documentation

## 2014-06-22 DIAGNOSIS — R102 Pelvic and perineal pain: Secondary | ICD-10-CM

## 2014-06-22 DIAGNOSIS — N8320 Unspecified ovarian cysts: Secondary | ICD-10-CM | POA: Insufficient documentation

## 2014-06-22 DIAGNOSIS — Z87891 Personal history of nicotine dependence: Secondary | ICD-10-CM | POA: Insufficient documentation

## 2014-06-22 HISTORY — DX: Unspecified ovarian cyst, unspecified side: N83.209

## 2014-06-22 LAB — POCT PREGNANCY, URINE: Preg Test, Ur: NEGATIVE

## 2014-06-22 LAB — WET PREP, GENITAL
Clue Cells Wet Prep HPF POC: NONE SEEN
Trich, Wet Prep: NONE SEEN
Yeast Wet Prep HPF POC: NONE SEEN

## 2014-06-22 LAB — URINALYSIS, ROUTINE W REFLEX MICROSCOPIC
Bilirubin Urine: NEGATIVE
Glucose, UA: NEGATIVE mg/dL
Hgb urine dipstick: NEGATIVE
KETONES UR: NEGATIVE mg/dL
Leukocytes, UA: NEGATIVE
NITRITE: NEGATIVE
PROTEIN: NEGATIVE mg/dL
Specific Gravity, Urine: 1.02 (ref 1.005–1.030)
UROBILINOGEN UA: 0.2 mg/dL (ref 0.0–1.0)
pH: 6 (ref 5.0–8.0)

## 2014-06-22 MED ORDER — KETOROLAC TROMETHAMINE 60 MG/2ML IM SOLN
30.0000 mg | Freq: Once | INTRAMUSCULAR | Status: AC
  Administered 2014-06-22: 30 mg via INTRAMUSCULAR
  Filled 2014-06-22: qty 2

## 2014-06-22 NOTE — MAU Provider Note (Signed)
History     CSN: [id number]  Arrival date and time: 06/22/14 1020   First Provider Initiated Contact with Patient 06/22/14 1240      Chief Complaint  Patient presents with  . Abdominal Pain   HPI  Dx'd with ovarian cyst @ [facility] 1-2 weeks ago, was told to come to MAU if pain became worse. Has appt in GYN clinic on 3/17. Pt states her pain became more severe on Sunday, still hurting now. Had HA this a.m., feels better now after taking Excedrin Tension. Has spotting, noted on toilet tissue.  Past Medical History  Diagnosis Date  . Hypertension     no longer takes meds  . Asthma   . Anxiety   . Depression   . Headache(784.0)     migraines  . Muscle spasm   . Scoliosis   . Ovarian cyst     Past Surgical History  Procedure Laterality Date  . Cholecystectomy    . Tubal ligation      pt has had pregnancy after and had  miscarriage    Family History  Problem Relation Age of Onset  . Cancer Father   . Hypertension Maternal Aunt   . Cancer Paternal Grandmother     History  Substance Use Topics  . Smoking status: Former Research scientist (life sciences)  . Smokeless tobacco: Not on file  . Alcohol Use: No    Allergies:  Allergies  Allergen Reactions  . Mushroom Extract Complex Anaphylaxis  . Penicillins Itching  . Percocet [Oxycodone-Acetaminophen] Itching    Prescriptions prior to admission  Medication Sig Dispense Refill Last Dose  . aspirin-acetaminophen-caffeine (EXCEDRIN MIGRAINE) 250-250-65 MG per tablet Take 2 tablets by mouth every 6 (six) hours as needed for headache.   06/22/2014 at Unknown time  . ibuprofen (ADVIL,MOTRIN) 200 MG tablet Take 400 mg by mouth every 4 (four) hours as needed for mild pain or moderate pain.   Past Week at Unknown time  . albuterol (PROVENTIL HFA;VENTOLIN HFA) 108 (90 BASE) MCG/ACT inhaler Inhale 1-2 puffs into the lungs every 6 (six) hours as needed for wheezing or shortness of breath. (Patient not taking: Reported on 06/22/2014) 1 Inhaler 1 06/04/2014 at  Unknown time  . cyclobenzaprine (FLEXERIL) 10 MG tablet Take 1 tablet (10 mg total) by mouth 2 (two) times daily as needed for muscle spasms. (Patient not taking: Reported on 06/22/2014) 20 tablet 0 06/04/2014 at Unknown time  . diazepam (VALIUM) 5 MG tablet Take 1 tablet (5 mg total) by mouth every 8 (eight) hours as needed for anxiety. (Patient not taking: Reported on 06/22/2014) 15 tablet 0 06/04/2014 at Unknown time  . diphenhydrAMINE (BENADRYL) 25 MG tablet Take 1 tablet (25 mg total) by mouth every 6 (six) hours. (Patient not taking: Reported on 06/22/2014) 20 tablet 0 06/04/2014 at Unknown time  . meloxicam (MOBIC) 7.5 MG tablet Take 2 tablets (15 mg total) by mouth daily. (Patient not taking: Reported on 06/22/2014) 30 tablet 0   . metroNIDAZOLE (FLAGYL) 500 MG tablet Take 1 tablet (500 mg total) by mouth 2 (two) times daily. (Patient not taking: Reported on 06/22/2014) 14 tablet 0 06/04/2014 at Unknown time  . metroNIDAZOLE (FLAGYL) 500 MG tablet Take 1 tablet (500 mg total) by mouth 2 (two) times daily. (Patient not taking: Reported on 06/22/2014) 14 tablet 0   . naproxen (NAPROSYN) 500 MG tablet Take 1 tablet (500 mg total) by mouth 2 (two) times daily. (Patient not taking: Reported on 06/22/2014) 20 tablet 0 06/04/2014 at Unknown  time  . ondansetron (ZOFRAN ODT) 4 MG disintegrating tablet Take 1 tablet (4 mg total) by mouth every 8 (eight) hours as needed for nausea or vomiting. (Patient not taking: Reported on 06/22/2014) 20 tablet 0     Review of Systems  Constitutional: Negative for fever.  HENT: Negative.   Eyes: Negative.   Respiratory: Negative for cough and shortness of breath.   Cardiovascular: Negative for chest pain.  Gastrointestinal: Positive for abdominal pain. Negative for nausea and vomiting.       Pelvic pain on right side  Genitourinary: Positive for dysuria.       Burning  Musculoskeletal: Negative.   Neurological: Negative.   Endo/Heme/Allergies: Negative.   Psychiatric/Behavioral:  Negative.    Physical Exam   Blood pressure 120/85, pulse 66, temperature 98.4 F (36.9 C), temperature source Oral, resp. rate 18, last menstrual period 05/01/2014.  Physical Exam  Nursing note and vitals reviewed. Constitutional: She is oriented to person, place, and time. She appears well-developed and well-nourished.  HENT:  Head: Normocephalic and atraumatic.  Neck: Normal range of motion.  Cardiovascular: Normal rate and regular rhythm.   Respiratory: Effort normal.  GI: Soft.  Genitourinary: Uterus normal. There is no rash, tenderness, lesion or injury on the right labia. There is no rash, tenderness, lesion or injury on the left labia. Cervix exhibits no motion tenderness and no friability. Right adnexum displays no mass, no tenderness and no fullness. Left adnexum displays no mass, no tenderness and no fullness. There is bleeding in the vagina. No erythema or tenderness in the vagina. No foreign body around the vagina. No signs of injury around the vagina. No vaginal discharge found.  Musculoskeletal: Normal range of motion.  Neurological: She is alert and oriented to person, place, and time.  Skin: Skin is warm and dry.  Psychiatric: She has a normal mood and affect. Her behavior is normal. Judgment and thought content normal.    MAU Course  Procedures  Spec exam WEt prep Results for orders placed or performed during the hospital encounter of 06/22/14 (from the past 48 hour(s))  Pregnancy, urine POC     Status: None   Collection Time: 06/22/14 10:45 AM  Result Value Ref Range   Preg Test, Ur NEGATIVE NEGATIVE    Comment:        THE SENSITIVITY OF THIS METHODOLOGY IS >24 mIU/mL   Urinalysis, Routine w reflex microscopic     Status: None   Collection Time: 06/22/14 10:46 AM  Result Value Ref Range   Color, Urine YELLOW YELLOW   APPearance CLEAR CLEAR   Specific Gravity, Urine 1.020 1.005 - 1.030   pH 6.0 5.0 - 8.0   Glucose, UA NEGATIVE NEGATIVE mg/dL   Hgb urine  dipstick NEGATIVE NEGATIVE   Bilirubin Urine NEGATIVE NEGATIVE   Ketones, ur NEGATIVE NEGATIVE mg/dL   Protein, ur NEGATIVE NEGATIVE mg/dL   Urobilinogen, UA 0.2 0.0 - 1.0 mg/dL   Nitrite NEGATIVE NEGATIVE   Leukocytes, UA NEGATIVE NEGATIVE    Comment: MICROSCOPIC NOT DONE ON URINES WITH NEGATIVE PROTEIN, BLOOD, LEUKOCYTES, NITRITE, OR GLUCOSE <1000 mg/dL.  Wet prep, genital     Status: Abnormal   Collection Time: 06/22/14  1:22 PM  Result Value Ref Range   Yeast Wet Prep HPF POC NONE SEEN NONE SEEN   Trich, Wet Prep NONE SEEN NONE SEEN   Clue Cells Wet Prep HPF POC NONE SEEN NONE SEEN   WBC, Wet Prep HPF POC FEW (A) NONE SEEN  Comment: FEW BACTERIA SEEN  US Transvaginal Non-ob  06/05/2014   CLINICAL DATA:  Right pelvic pain for 3 weeks.  EXAM: TRANSABDOMINAL ULTRASOUND OF PELVIS  DOPPLER ULTRASOUND OF OVARIES  TECHNIQUE: Transabdominal ultrasound examination of the pelvis was performed including evaluation of the uterus, ovaries, adnexal regions, and pelvic cul-de-sac.  Color and duplex Doppler ultrasound was utilized to evaluate blood flow to the ovaries.  COMPARISON:  CT 11/23/2012  FINDINGS: Uterus  Measurements: 9.0 x 5.0 x 5.3 cm. No fibroids or other mass visualized.  Endometrium  Thickness: 8 mm. No focal abnormality visualized.  Right ovary  Measurements: 2.3 x 1.6 x 2.1 cm. Normal appearance/no adnexal mass.  Left ovary  Measurements: 4.0 x 2.8 x 2.4 cm. Within the left ovary there is a 2.5 x 2.1 x 2.1 cm cyst.  Pulsed Doppler evaluation demonstrates normal low-resistance arterial and venous waveforms in both ovaries.  Small amount of free fluid in the pelvis.  IMPRESSION: Normal arterial venous waveforms are demonstrated to the ovaries bilaterally.  The left ovary is prominent and contains a 2.5 cm simple cyst.  Small amount of free fluid in the pelvis.   Electronically Signed   By: Lovey Newcomer M.D.   On: 06/05/2014 13:23   US Pelvis Complete  06/05/2014   CLINICAL DATA:  Right  pelvic pain for 3 weeks.  EXAM: TRANSABDOMINAL ULTRASOUND OF PELVIS  DOPPLER ULTRASOUND OF OVARIES  TECHNIQUE: Transabdominal ultrasound examination of the pelvis was performed including evaluation of the uterus, ovaries, adnexal regions, and pelvic cul-de-sac.  Color and duplex Doppler ultrasound was utilized to evaluate blood flow to the ovaries.  COMPARISON:  CT 11/23/2012  FINDINGS: Uterus  Measurements: 9.0 x 5.0 x 5.3 cm. No fibroids or other mass visualized.  Endometrium  Thickness: 8 mm. No focal abnormality visualized.  Right ovary  Measurements: 2.3 x 1.6 x 2.1 cm. Normal appearance/no adnexal mass.  Left ovary  Measurements: 4.0 x 2.8 x 2.4 cm. Within the left ovary there is a 2.5 x 2.1 x 2.1 cm cyst.  Pulsed Doppler evaluation demonstrates normal low-resistance arterial and venous waveforms in both ovaries.  Small amount of free fluid in the pelvis.  IMPRESSION: Normal arterial venous waveforms are demonstrated to the ovaries bilaterally.  The left ovary is prominent and contains a 2.5 cm simple cyst.  Small amount of free fluid in the pelvis.   Electronically Signed   By: Lovey Newcomer M.D.   On: 06/05/2014 13:23   [place] Art/ven Flow Abd Pelv Doppler  06/05/2014   CLINICAL DATA:  Right pelvic pain for 3 weeks.  EXAM: TRANSABDOMINAL ULTRASOUND OF PELVIS  DOPPLER ULTRASOUND OF OVARIES  TECHNIQUE: Transabdominal ultrasound examination of the pelvis was performed including evaluation of the uterus, ovaries, adnexal regions, and pelvic cul-de-sac.  Color and duplex Doppler ultrasound was utilized to evaluate blood flow to the ovaries.  COMPARISON:  CT 11/23/2012  FINDINGS: Uterus  Measurements: 9.0 x 5.0 x 5.3 cm. No fibroids or other mass visualized.  Endometrium  Thickness: 8 mm. No focal abnormality visualized.  Right ovary  Measurements: 2.3 x 1.6 x 2.1 cm. Normal appearance/no adnexal mass.  Left ovary  Measurements: 4.0 x 2.8 x 2.4 cm. Within the left ovary there is a 2.5 x 2.1 x 2.1 cm cyst.  Pulsed  Doppler evaluation demonstrates normal low-resistance arterial and venous waveforms in both ovaries.  Small amount of free fluid in the pelvis.  IMPRESSION: Normal arterial venous waveforms are demonstrated to the ovaries bilaterally.  The left ovary is prominent and contains a 2.5 cm simple cyst.  Small amount of free fluid in the pelvis.   Electronically Signed   By: Lovey Newcomer M.D.   On: 06/05/2014 13:23    Assessment and Plan  Probable Menses Ovarian Cyst  Discharge to home F/U in [place] 06/22/2014, 12:44 PM

## 2014-06-22 NOTE — Discharge Instructions (Signed)
Ovarian Cyst An ovarian cyst is a fluid-filled sac that forms on an ovary. The ovaries are small organs that produce eggs in women. Various types of cysts can form on the ovaries. Most are not cancerous. Many do not cause problems, and they often go away on their own. Some may cause symptoms and require treatment. Common types of ovarian cysts include:  Functional cysts--These cysts may occur every month during the menstrual cycle. This is normal. The cysts usually go away with the next menstrual cycle if the woman does not get pregnant. Usually, there are no symptoms with a functional cyst.  Endometrioma cysts--These cysts form from the tissue that lines the uterus. They are also called "chocolate cysts" because they become filled with blood that turns brown. This type of cyst can cause pain in the lower abdomen during intercourse and with your menstrual period.  Cystadenoma cysts--This type develops from the cells on the outside of the ovary. These cysts can get very big and cause lower abdomen pain and pain with intercourse. This type of cyst can twist on itself, cut off its blood supply, and cause severe pain. It can also easily rupture and cause a lot of pain.  Dermoid cysts--This type of cyst is sometimes found in both ovaries. These cysts may contain different kinds of body tissue, such as skin, teeth, hair, or cartilage. They usually do not cause symptoms unless they get very big.  Theca lutein cysts--These cysts occur when too much of a certain hormone (human chorionic gonadotropin) is produced and overstimulates the ovaries to produce an egg. This is most common after procedures used to assist with the conception of a baby (in vitro fertilization). CAUSES   Fertility drugs can cause a condition in which multiple large cysts are formed on the ovaries. This is called ovarian hyperstimulation syndrome.  A condition called polycystic ovary syndrome can cause hormonal imbalances that can lead to  nonfunctional ovarian cysts. SIGNS AND SYMPTOMS  Many ovarian cysts do not cause symptoms. If symptoms are present, they may include:  Pelvic pain or pressure.  Pain in the lower abdomen.  Pain during sexual intercourse.  Increasing girth (swelling) of the abdomen.  Abnormal menstrual periods.  Increasing pain with menstrual periods.  Stopping having menstrual periods without being pregnant. DIAGNOSIS  These cysts are commonly found during a routine or annual pelvic exam. Tests may be ordered to find out more about the cyst. These tests may include:  Ultrasound.  X-ray of the pelvis.  CT scan.  MRI.  Blood tests. TREATMENT  Many ovarian cysts go away on their own without treatment. Your health care provider may want to check your cyst regularly for 2-3 months to see if it changes. For women in menopause, it is particularly important to monitor a cyst closely because of the higher rate of ovarian cancer in menopausal women. When treatment is needed, it may include any of the following:  A procedure to drain the cyst (aspiration). This may be done using a long needle and ultrasound. It can also be done through a laparoscopic procedure. This involves using a thin, lighted tube with a tiny camera on the end (laparoscope) inserted through a small incision.  Surgery to remove the whole cyst. This may be done using laparoscopic surgery or an open surgery involving a larger incision in the lower abdomen.  Hormone treatment or birth control pills. These methods are sometimes used to help dissolve a cyst. HOME CARE INSTRUCTIONS   Only take over-the-counter   or prescription medicines as directed by your health care provider.  Follow up with your health care provider as directed.  Get regular pelvic exams and Pap tests. SEEK MEDICAL CARE IF:   Your periods are late, irregular, or painful, or they stop.  Your pelvic pain or abdominal pain does not go away.  Your abdomen becomes  larger or swollen.  You have pressure on your bladder or trouble emptying your bladder completely.  You have pain during sexual intercourse.  You have feelings of fullness, pressure, or discomfort in your stomach.  You lose weight for no apparent reason.  You feel generally ill.  You become constipated.  You lose your appetite.  You develop acne.  You have an increase in body and facial hair.  You are gaining weight, without changing your exercise and eating habits.  You think you are pregnant. SEEK IMMEDIATE MEDICAL CARE IF:   You have increasing abdominal pain.  You feel sick to your stomach (nauseous), and you throw up (vomit).  You develop a fever that comes on suddenly.  You have abdominal pain during a bowel movement.  Your menstrual periods become heavier than usual. MAKE SURE YOU:  Understand these instructions.  Will watch your condition.  Will get help right away if you are not doing well or get worse. Document Released: 04/09/2005 Document Revised: 04/14/2013 Document Reviewed: 12/15/2012 ExitCare Patient Information 2015 [employer]. This information is not intended to replace advice given to you by your health care provider. Make sure you discuss any questions you have with your health care provider.  

## 2014-06-22 NOTE — MAU Note (Signed)
Dx'd with ovarian cyst @ [facility] 1-2 weeks ago, was told to come to MAU if pain became worse.  Has appt in GYN clinic on 3/17.  Pt states her pain became more severe on Sunday, still hurting now.  Had HA this a.m., feels better now after taking Excedrin Tension.  Has spotting, noted on toilet tissue.

## 2014-06-22 NOTE — Progress Notes (Signed)
Patient had genital cultures and labs on 06/05/2014.

## 2014-07-08 ENCOUNTER — Telehealth: Payer: Self-pay | Admitting: *Deleted

## 2014-07-08 ENCOUNTER — Encounter: Payer: Self-pay | Admitting: Family Medicine

## 2014-07-08 ENCOUNTER — Ambulatory Visit (INDEPENDENT_AMBULATORY_CARE_PROVIDER_SITE_OTHER): Payer: Self-pay | Admitting: Family Medicine

## 2014-07-08 VITALS — BP 124/79 | HR 93 | Temp 98.0°F | Ht 60.0 in | Wt 208.1 lb

## 2014-07-08 DIAGNOSIS — N8320 Unspecified ovarian cysts: Secondary | ICD-10-CM

## 2014-07-08 DIAGNOSIS — N83209 Unspecified ovarian cyst, unspecified side: Secondary | ICD-10-CM

## 2014-07-08 MED ORDER — METRONIDAZOLE 500 MG PO TABS: 2000.0000 mg | ORAL_TABLET | Freq: Once | ORAL | Status: DC

## 2014-07-08 NOTE — Telephone Encounter (Signed)
Pt left message stating that she had some sharp pain during the night and is not sure if it was her ovarian cyst or appendix. I called pt back and discussed her concern. She stated that she had pain last night from 8pm until about 3:45 am. "The pain was sharp but not enough to call 911". She is not sure if it was her ovarian cyst "busting" or her appendix. She denies pain at this time. I advised pt that the pain could have been from a ruptured ovarian cyst but it was definitely not her appendix. If it had been the appendix, she would still be having pain and also would likely have nausea and vomiting. I advised pt to discuss this occurrence with the doctor at her scheduled visit today @ 1445.  Pt voiced understanding.

## 2014-07-08 NOTE — Progress Notes (Signed)
   Subjective:    Patient ID: Dana Hughes, female    DOB: 1981/03/01, 34 y.o.   MRN: [id number]  HPI 34 yo patient seen for left pelvic pain.  Was seen in ED and diagnosed with a 2.5x2x2cm left ovarian cyst.  Continues to have some pain on the left side.  No vaginal bleeding, vaginal discharge, weight gain, weight loss.   Review of Systems  All other systems reviewed and are negative.      Objective:   Physical Exam  Constitutional: She is oriented to person, place, and time. She appears well-developed and well-nourished.  Cardiovascular: Normal rate and regular rhythm.  Exam reveals no gallop and no friction rub.   No murmur heard. Pulmonary/Chest: Effort normal and breath sounds normal. No respiratory distress. She has no wheezes. She has no rales. She exhibits no tenderness.  Abdominal: She exhibits no distension and no mass. There is no tenderness. There is no rebound and no guarding.  Genitourinary: No labial fusion. There is no rash, tenderness, lesion or injury on the right labia. There is no rash, tenderness, lesion or injury on the left labia. Uterus is not deviated, not enlarged, not fixed and not tender. Cervix exhibits no motion tenderness, no discharge and no friability. Right adnexum displays no mass, no tenderness and no fullness. Left adnexum displays tenderness and fullness. Left adnexum displays no mass. No erythema, tenderness or bleeding in the vagina. No foreign body around the vagina. No signs of injury around the vagina. No vaginal discharge found.  Neurological: She is alert and oriented to person, place, and time.  Skin: Skin is warm and dry.  Psychiatric: She has a normal mood and affect. Her behavior is normal. Judgment and thought content normal.      Assessment & Plan:   Problem List Items Addressed This Visit    None    Visit Diagnoses    Simple ovarian cyst    -  Primary    Relevant Orders    US Pelvis Complete      Repeat US to follow up cyst.   Pt referred to [facility] for PAP.

## 2014-07-14 ENCOUNTER — Other Ambulatory Visit: Payer: Self-pay | Admitting: Family Medicine

## 2014-07-14 ENCOUNTER — Ambulatory Visit (HOSPITAL_COMMUNITY)
Admission: RE | Admit: 2014-07-14 | Discharge: 2014-07-14 | Disposition: A | Discharge status: Home / Self Care | Payer: Self-pay | Source: Ambulatory Visit | Attending: Family Medicine | Admitting: Family Medicine

## 2014-07-14 DIAGNOSIS — N83209 Unspecified ovarian cyst, unspecified side: Secondary | ICD-10-CM

## 2014-07-14 DIAGNOSIS — N8320 Unspecified ovarian cysts: Secondary | ICD-10-CM | POA: Insufficient documentation

## 2014-09-02 ENCOUNTER — Inpatient Hospital Stay (HOSPITAL_COMMUNITY): Payer: Self-pay

## 2014-09-02 ENCOUNTER — Inpatient Hospital Stay (HOSPITAL_COMMUNITY)
Admission: AD | Admit: 2014-09-02 | Discharge: 2014-09-02 | Disposition: A | Discharge status: Home / Self Care | Payer: Self-pay | Source: Ambulatory Visit | Attending: Obstetrics & Gynecology | Admitting: Obstetrics & Gynecology

## 2014-09-02 ENCOUNTER — Encounter (HOSPITAL_COMMUNITY): Payer: Self-pay | Admitting: *Deleted

## 2014-09-02 DIAGNOSIS — F5089 Other specified eating disorder: Secondary | ICD-10-CM

## 2014-09-02 DIAGNOSIS — R1032 Left lower quadrant pain: Secondary | ICD-10-CM | POA: Insufficient documentation

## 2014-09-02 DIAGNOSIS — M25552 Pain in left hip: Secondary | ICD-10-CM

## 2014-09-02 DIAGNOSIS — Z8249 Family history of ischemic heart disease and other diseases of the circulatory system: Secondary | ICD-10-CM | POA: Insufficient documentation

## 2014-09-02 DIAGNOSIS — Z87891 Personal history of nicotine dependence: Secondary | ICD-10-CM | POA: Insufficient documentation

## 2014-09-02 LAB — URINALYSIS, ROUTINE W REFLEX MICROSCOPIC
Bilirubin Urine: NEGATIVE
Glucose, UA: NEGATIVE mg/dL
Hgb urine dipstick: NEGATIVE
Ketones, ur: NEGATIVE mg/dL
LEUKOCYTES UA: NEGATIVE
NITRITE: NEGATIVE
PROTEIN: NEGATIVE mg/dL
Specific Gravity, Urine: >1.03 — ABNORMAL HIGH (ref 1.005–1.030)
UROBILINOGEN UA: 0.2 mg/dL (ref 0.0–1.0)
pH: 6 (ref 5.0–8.0)

## 2014-09-02 LAB — POCT PREGNANCY, URINE: PREG TEST UR: NEGATIVE

## 2014-09-02 MED ORDER — METRONIDAZOLE 500 MG PO TABS: 500.0000 mg | ORAL_TABLET | Freq: Two times a day (BID) | ORAL | Status: DC

## 2014-09-02 NOTE — Discharge Instructions (Signed)
Abdominal Pain, Women °Abdominal (stomach, pelvic, or belly) pain can be caused by many things. It is important to tell your doctor: °· The location of the pain. °· Does it come and go or is it present all the time? °· Are there things that start the pain (eating certain foods, exercise)? °· Are there other symptoms associated with the pain (fever, nausea, vomiting, diarrhea)? °All of this is helpful to know when trying to find the cause of the pain. °CAUSES  °· Stomach: virus or bacteria infection, or ulcer. °· Intestine: appendicitis (inflamed appendix), regional ileitis (Crohn's disease), ulcerative colitis (inflamed colon), irritable bowel syndrome, diverticulitis (inflamed diverticulum of the colon), or cancer of the stomach or intestine. °· Gallbladder disease or stones in the gallbladder. °· Kidney disease, kidney stones, or infection. °· Pancreas infection or cancer. °· Fibromyalgia (pain disorder). °· Diseases of the female organs: °¨ Uterus: fibroid (non-cancerous) tumors or infection. °¨ Fallopian tubes: infection or tubal pregnancy. °¨ Ovary: cysts or tumors. °¨ Pelvic adhesions (scar tissue). °¨ Endometriosis (uterus lining tissue growing in the pelvis and on the pelvic organs). °¨ Pelvic congestion syndrome (female organs filling up with blood just before the menstrual period). °¨ Pain with the menstrual period. °¨ Pain with ovulation (producing an egg). °¨ Pain with an IUD (intrauterine device, birth control) in the uterus. °¨ Cancer of the female organs. °· Functional pain (pain not caused by a disease, may improve without treatment). °· Psychological pain. °· Depression. °DIAGNOSIS  °Your doctor will decide the seriousness of your pain by doing an examination. °· Blood tests. °· X-rays. °· Ultrasound. °· CT scan (computed tomography, special type of X-ray). °· MRI (magnetic resonance imaging). °· Cultures, for infection. °· Barium enema (dye inserted in the large intestine, to better view it with  X-rays). °· Colonoscopy (looking in intestine with a lighted tube). °· Laparoscopy (minor surgery, looking in abdomen with a lighted tube). °· Major abdominal exploratory surgery (looking in abdomen with a large incision). °TREATMENT  °The treatment will depend on the cause of the pain.  °· Many cases can be observed and treated at home. °· Over-the-counter medicines recommended by your caregiver. °· Prescription medicine. °· Antibiotics, for infection. °· Birth control pills, for painful periods or for ovulation pain. °· Hormone treatment, for endometriosis. °· Nerve blocking injections. °· Physical therapy. °· Antidepressants. °· Counseling with a psychologist or psychiatrist. °· Minor or major surgery. °HOME CARE INSTRUCTIONS  °· Do not take laxatives, unless directed by your caregiver. °· Take over-the-counter pain medicine only if ordered by your caregiver. Do not take aspirin because it can cause an upset stomach or bleeding. °· Try a clear liquid diet (broth or water) as ordered by your caregiver. Slowly move to a bland diet, as tolerated, if the pain is related to the stomach or intestine. °· Have a thermometer and take your temperature several times a day, and record it. °· Bed rest and sleep, if it helps the pain. °· Avoid sexual intercourse, if it causes pain. °· Avoid stressful situations. °· Keep your follow-up appointments and tests, as your caregiver orders. °· If the pain does not go away with medicine or surgery, you may try: °¨ Acupuncture. °¨ Relaxation exercises (yoga, meditation). °¨ Group therapy. °¨ Counseling. °SEEK MEDICAL CARE IF:  °· You notice certain foods cause stomach pain. °· Your home care treatment is not helping your pain. °· You need stronger pain medicine. °· You want your IUD removed. °· You feel faint or   lightheaded. °· You develop nausea and vomiting. °· You develop a rash. °· You are having side effects or an allergy to your medicine. °SEEK IMMEDIATE MEDICAL CARE IF:  °· Your  pain does not go away or gets worse. °· You have a fever. °· Your pain is felt only in portions of the abdomen. The right side could possibly be appendicitis. The left lower portion of the abdomen could be colitis or diverticulitis. °· You are passing blood in your stools (bright red or black tarry stools, with or without vomiting). °· You have blood in your urine. °· You develop chills, with or without a fever. °· You pass out. °MAKE SURE YOU:  °· Understand these instructions. °· Will watch your condition. °· Will get help right away if you are not doing well or get worse. °Document Released: 02/04/2007 Document Revised: 08/24/2013 Document Reviewed: 02/24/2009 °ExitCare® Patient Information ©2015 [employer]. This information is not intended to replace advice given to you by your health care provider. Make sure you discuss any questions you have with your health care provider. ° °

## 2014-09-02 NOTE — MAU Note (Addendum)
C/O abdominal pain and lower back pain that started a week ago. Lower abdominal pain LLQ. Says she has history of ovarian cyst on left ovary. Patient wants to find out if the "clamps" on her tubes is causing the pain or that the cyst has not "turned into cancer". Nausea, no vomiting. Able to eat and drink without vomiting. 2-3 weeks ago started eating tissue like she did while pregnant with her children.

## 2014-09-02 NOTE — MAU Provider Note (Signed)
History     CSN: [id number]  Arrival date and time: 09/02/14 1322   None     Chief Complaint  Patient presents with  . Abdominal Pain  . Back Pain   Abdominal Pain This is a recurrent problem. The current episode started in the past 7 days. The onset quality is gradual. The problem occurs intermittently. The problem has been unchanged. The pain is located in the LLQ. The pain is moderate. The quality of the pain is cramping, aching and burning. The abdominal pain does not radiate. Associated symptoms include nausea. Pertinent negatives include no constipation, diarrhea, fever, headaches, myalgias or vomiting. Nothing aggravates the pain. The pain is relieved by nothing. She has tried nothing for the symptoms.   This is a 34 y.o. female who presents with c/o left lower quadrant abdominal pain.  States has had a cyst on left ovary before and thinks it is still there. Wants to see if it "turned into cancer".  Worried about clips on tubes, kind of wants to have another baby. Denies vomiting but has nausea. Able to tolerate POs. Has been eating pica, similar to when she was pregnant.   RN Note: C/O abdominal pain and lower back pain that started a week ago. Lower abdominal pain LLQ. Says she has history of ovarian cyst on left ovary. Patient wants to find out if the "clamps" on her tubes is causing the pain or that the cyst has not "turned into cancer". Nausea, no vomiting. Able to eat and drink without vomiting. 2-3 weeks ago started eating tissue like she did while pregnant with her children.           OB History    Gravida Para Term Preterm AB TAB SAB Ectopic Multiple Living   4 3 3  0 1 0 1 0 0 3      Past Medical History  Diagnosis Date  . Hypertension     no longer takes meds  . Asthma   . Anxiety   . Depression   . Headache(784.0)     migraines  . Muscle spasm   . Scoliosis   . Ovarian cyst     Past Surgical History  Procedure Laterality Date  . Cholecystectomy     . Tubal ligation      pt has had pregnancy after and had  miscarriage    Family History  Problem Relation Age of Onset  . Cancer Father   . Hypertension Maternal Aunt   . Cancer Paternal Grandmother     History  Substance Use Topics  . Smoking status: Former Research scientist (life sciences)  . Smokeless tobacco: Not on file  . Alcohol Use: Yes     Comment: rare    Allergies:  Allergies  Allergen Reactions  . Mushroom Extract Complex Anaphylaxis  . Penicillins Itching  . Percocet [Oxycodone-Acetaminophen] Itching    Prescriptions prior to admission  Medication Sig Dispense Refill Last Dose  . ibuprofen (ADVIL,MOTRIN) 200 MG tablet Take 200 mg by mouth every 4 (four) hours as needed for fever or moderate pain.   Past Week at Unknown time  . naproxen sodium (ANAPROX) 220 MG tablet Take 220 mg by mouth daily as needed (pain).   09/01/2014 at Unknown time  . albuterol (PROVENTIL HFA;VENTOLIN HFA) 108 (90 BASE) MCG/ACT inhaler Inhale 1-2 puffs into the lungs every 6 (six) hours as needed for wheezing or shortness of breath. 1 Inhaler 1 prn  . metroNIDAZOLE (FLAGYL) 500 MG tablet Take 4 tablets (2,000 mg  total) by mouth once. (Patient not taking: Reported on 09/02/2014) 4 tablet 0     Review of Systems  Constitutional: Negative for fever, chills and malaise/fatigue.  Gastrointestinal: Positive for nausea and abdominal pain. Negative for vomiting, diarrhea and constipation.  Musculoskeletal: Negative for myalgias and back pain.  Neurological: Negative for headaches.   Physical Exam   Blood pressure 124/82, pulse 97, temperature 98.4 F (36.9 C), temperature source Oral, resp. rate 18, height 5\' 3"  (1.6 m), weight 209 lb (94.802 kg), last menstrual period 08/21/2014.  Physical Exam  Constitutional: She is oriented to person, place, and time. She appears well-developed and well-nourished. No distress.  HENT:  Head: Normocephalic.  Cardiovascular: Normal rate, regular rhythm and normal heart sounds.   Exam reveals no gallop and no friction rub.   No murmur heard. Respiratory: Effort normal and breath sounds normal. No respiratory distress. She has no wheezes. She has no rales.  GI: Soft. She exhibits no distension and no mass. There is tenderness (mildly tender LLQ). There is no rebound and no guarding.  Genitourinary: Vagina normal. No vaginal discharge found.  Cervix long and closed Uterus small and nontender SLightly tender LLQ  Musculoskeletal: Normal range of motion.  Neurological: She is alert and oriented to person, place, and time.  Skin: Skin is warm and dry.  Psychiatric: She has a normal mood and affect.    MAU Course  Procedures  MDM US Transvaginal Non-ob  09/02/2014   CLINICAL DATA:  Left lower quadrant pain for 10 days.  EXAM: TRANSABDOMINAL AND TRANSVAGINAL ULTRASOUND OF PELVIS  TECHNIQUE: Both transabdominal and transvaginal ultrasound examinations of the pelvis were performed. Transabdominal technique was performed for global imaging of the pelvis including uterus, ovaries, adnexal regions, and pelvic cul-de-sac. It was necessary to proceed with endovaginal exam following the transabdominal exam to visualize the ovaries.  COMPARISON:  None  FINDINGS: Uterus  Measurements: 9.7 x 4.8 x 5.7 cm. No fibroids or other mass visualized.  Endometrium  Thickness: 11.9 mm.  No focal abnormality visualized.  Right ovary  Measurements: 3.5 x 2.1 x 1.6 cm. Normal appearance/no adnexal mass.  Left ovary  Measurements: 3.0 x 2.7 x 2.5 cm. Normal appearance/no adnexal mass.  Other findings  There is a small volume free pelvic fluid. The left ovarian cyst observed on 07/14/2014 has resolved.  IMPRESSION: Small volume free pelvic fluid.  Normal uterus and ovaries.   Electronically Signed   By: Andreas Newport M.D.   On: 09/02/2014 17:18   Results for KATEY, BARRIE (MRN [id number]) as of 09/07/2014 01:22  Ref. Range 09/02/2014 13:46  Preg Test, Ur Latest Ref Range: NEGATIVE  NEGATIVE   Results for SARABI, SOCKWELL (MRN [id number]) as of 09/07/2014 01:22  Ref. Range 09/02/2014 13:40  Appearance Latest Ref Range: CLEAR  CLEAR  Bilirubin Urine Latest Ref Range: NEGATIVE  NEGATIVE  Color, Urine Latest Ref Range: YELLOW  YELLOW  Glucose Latest Ref Range: NEGATIVE mg/dL NEGATIVE  Hgb urine dipstick Latest Ref Range: NEGATIVE  NEGATIVE  Ketones, ur Latest Ref Range: NEGATIVE mg/dL NEGATIVE  Leukocytes, UA Latest Ref Range: NEGATIVE  NEGATIVE  Nitrite Latest Ref Range: NEGATIVE  NEGATIVE  pH Latest Ref Range: 5.0-8.0  6.0  Protein Latest Ref Range: NEGATIVE mg/dL NEGATIVE  Specific Gravity, Urine Latest Ref Range: 1.005-1.030  >1.030 (H)  Urobilinogen, UA Latest Ref Range: 0.0-1.0 mg/dL 0.2    Assessment and Plan  A:  Left lower quadrant pain      No evidence  of ovarian cyst      Unsure etiology of pain  P:  Discharge home      Reviewed findings of exam and testing      Advised to return to family doctor for further workup  [facility] 09/02/2014, 2:47 PM

## 2014-09-02 NOTE — MAU Note (Signed)
Pt presents to MAU with complaints of lower abdominal and back pain for a couple of days. Denies any vaginal bleeding, reports clear vaginal discharge

## 2014-09-20 ENCOUNTER — Encounter (HOSPITAL_COMMUNITY): Payer: Self-pay | Admitting: Nurse Practitioner

## 2014-09-20 ENCOUNTER — Emergency Department (HOSPITAL_COMMUNITY)
Admission: EM | Admit: 2014-09-20 | Discharge: 2014-09-20 | Disposition: A | Discharge status: Home / Self Care | Payer: Self-pay | Attending: Emergency Medicine | Admitting: Emergency Medicine

## 2014-09-20 DIAGNOSIS — Z880 Allergy status to penicillin: Secondary | ICD-10-CM | POA: Insufficient documentation

## 2014-09-20 DIAGNOSIS — J45909 Unspecified asthma, uncomplicated: Secondary | ICD-10-CM | POA: Insufficient documentation

## 2014-09-20 DIAGNOSIS — Z79899 Other long term (current) drug therapy: Secondary | ICD-10-CM | POA: Insufficient documentation

## 2014-09-20 DIAGNOSIS — I10 Essential (primary) hypertension: Secondary | ICD-10-CM | POA: Insufficient documentation

## 2014-09-20 DIAGNOSIS — Z792 Long term (current) use of antibiotics: Secondary | ICD-10-CM | POA: Insufficient documentation

## 2014-09-20 DIAGNOSIS — M545 Low back pain: Secondary | ICD-10-CM | POA: Insufficient documentation

## 2014-09-20 DIAGNOSIS — Z87891 Personal history of nicotine dependence: Secondary | ICD-10-CM | POA: Insufficient documentation

## 2014-09-20 DIAGNOSIS — D649 Anemia, unspecified: Secondary | ICD-10-CM | POA: Insufficient documentation

## 2014-09-20 DIAGNOSIS — M419 Scoliosis, unspecified: Secondary | ICD-10-CM | POA: Insufficient documentation

## 2014-09-20 DIAGNOSIS — G8929 Other chronic pain: Secondary | ICD-10-CM | POA: Insufficient documentation

## 2014-09-20 DIAGNOSIS — Z8659 Personal history of other mental and behavioral disorders: Secondary | ICD-10-CM | POA: Insufficient documentation

## 2014-09-20 DIAGNOSIS — Z8742 Personal history of other diseases of the female genital tract: Secondary | ICD-10-CM | POA: Insufficient documentation

## 2014-09-20 DIAGNOSIS — M549 Dorsalgia, unspecified: Secondary | ICD-10-CM

## 2014-09-20 DIAGNOSIS — Z3202 Encounter for pregnancy test, result negative: Secondary | ICD-10-CM | POA: Insufficient documentation

## 2014-09-20 LAB — CBC
HEMATOCRIT: 28.3 % — AB (ref 36.0–46.0)
Hemoglobin: 8.9 g/dL — ABNORMAL LOW (ref 12.0–15.0)
MCH: 26.6 pg (ref 26.0–34.0)
MCHC: 31.4 g/dL (ref 30.0–36.0)
MCV: 84.5 fL (ref 78.0–100.0)
PLATELETS: 486 10*3/uL — AB (ref 150–400)
RBC: 3.35 MIL/uL — ABNORMAL LOW (ref 3.87–5.11)
RDW: 13.9 % (ref 11.5–15.5)
WBC: 8.2 10*3/uL (ref 4.0–10.5)

## 2014-09-20 LAB — POC URINE PREG, ED: PREG TEST UR: NEGATIVE

## 2014-09-20 MED ORDER — CYCLOBENZAPRINE HCL 10 MG PO TABS: 10.0000 mg | ORAL_TABLET | Freq: Three times a day (TID) | ORAL | Status: DC | PRN

## 2014-09-20 MED ORDER — ACETAMINOPHEN 325 MG PO TABS
650.0000 mg | ORAL_TABLET | Freq: Once | ORAL | Status: AC
  Administered 2014-09-20: 650 mg via ORAL
  Filled 2014-09-20: qty 2

## 2014-09-20 NOTE — ED Provider Notes (Signed)
CSN: [id number]     Arrival date & time 09/20/14  1146 History   First MD Initiated Contact with Patient 09/20/14 1202     Chief Complaint  Patient presents with  . Anemia     (Consider location/radiation/quality/duration/timing/severity/associated sxs/prior Treatment) HPI  34 year old female presents requesting her iron level to be checked. She's been trying to give plasma and each time they test her blood they tell her that her iron is low and that she was be cleared by physician before she can resume giving plasma. The patient states that she has irregular menstrual cycles but denies heavy bleeding and denies melanoma or hematochezia. The patient has not had any lightheadedness, dizziness, or chest pain. She was supposed to go start herself on iron but did not know which one to get. The patient does not have a PCP. She is also complaining of acute on chronic low back pain from scoliosis and states that she was referred to orthopedics back in October but has been delaying the visit and is requesting for me to tell her who she was scheduled to seek and she cannot remember any more. Denies any weakness or numbness or incontinence with her back pain. No injuries.  Past Medical History  Diagnosis Date  . Hypertension     no longer takes meds  . Asthma   . Anxiety   . Depression   . Headache(784.0)     migraines  . Muscle spasm   . Scoliosis   . Ovarian cyst    Past Surgical History  Procedure Laterality Date  . Cholecystectomy    . Tubal ligation      pt has had pregnancy after and had  miscarriage   Family History  Problem Relation Age of Onset  . Cancer Father   . Hypertension Maternal Aunt   . Cancer Paternal Grandmother    History  Substance Use Topics  . Smoking status: Former Research scientist (life sciences)  . Smokeless tobacco: Not on file  . Alcohol Use: Yes     Comment: rare   OB History    Gravida Para Term Preterm AB TAB SAB Ectopic Multiple Living   4 3 3  0 1 0 1 0 0 3     Review  of Systems  Constitutional: Negative for fatigue.  Respiratory: Negative for shortness of breath.   Cardiovascular: Negative for chest pain.  Gastrointestinal: Negative for vomiting.  Genitourinary: Negative for menstrual problem.  Musculoskeletal: Positive for back pain.  Neurological: Negative for dizziness, syncope, weakness, light-headedness and numbness.  All other systems reviewed and are negative.     Allergies  Mushroom extract complex and Penicillins  Home Medications   Prior to Admission medications   Medication Sig Start Date End Date Taking? Authorizing Provider  albuterol (PROVENTIL HFA;VENTOLIN HFA) 108 (90 BASE) MCG/ACT inhaler Inhale 1-2 puffs into the lungs every 6 (six) hours as needed for wheezing or shortness of breath. 12/19/13   Lezlie Lye, NP  ibuprofen (ADVIL,MOTRIN) 200 MG tablet Take 200 mg by mouth every 4 (four) hours as needed for fever or moderate pain.    Historical Provider, MD  metroNIDAZOLE (FLAGYL) 500 MG tablet Take 1 tablet (500 mg total) by mouth 2 (two) times daily. 09/02/14   Seabron Spates, CNM  naproxen sodium (ANAPROX) 220 MG tablet Take 220 mg by mouth daily as needed (pain).    Historical Provider, MD   BP 118/83 mmHg  Pulse 83  Temp(Src) 97.9 F (36.6 C)  Resp 18  Ht 5' 2.5" (1.588 m)  Wt 209 lb (94.802 kg)  BMI 37.59 kg/m2  SpO2 100%  LMP 08/21/2014 (Exact Date) Physical Exam  Constitutional: She is oriented to person, place, and time. She appears well-developed and well-nourished.  HENT:  Head: Normocephalic and atraumatic.  Right Ear: External ear normal.  Left Ear: External ear normal.  Nose: Nose normal.  Eyes: Right eye exhibits no discharge. Left eye exhibits no discharge.  Cardiovascular: Normal rate, regular rhythm and normal heart sounds.   Pulmonary/Chest: Effort normal and breath sounds normal.  Abdominal: Soft. She exhibits no distension. There is no tenderness.  Musculoskeletal:       Lumbar back: She  exhibits tenderness. She exhibits no bony tenderness.  Neurological: She is alert and oriented to person, place, and time.  5/5 strength in both lower extremities. Normal gross sensation.  Skin: Skin is warm and dry. No pallor.  Nursing note and vitals reviewed.   ED Course  Procedures (including critical care time) Labs Review Labs Reviewed  CBC - Abnormal; Notable for the following:    RBC 3.35 (*)    Hemoglobin 8.9 (*)    HCT 28.3 (*)    Platelets 486 (*)    All other components within normal limits  POC URINE PREG, ED    Imaging Review No results found.   EKG Interpretation None      MDM   Final diagnoses:  Anemia, unspecified anemia type  Chronic back pain    Patient appears to have acute on chronic anemia but no obvious source. Hemoglobin is currently 8.9, most recent was 10.9 several months ago.however she's on his had borderline anemia and does not seem to have any obvious source. Given that she is not symptomatically is likely an ongoing problem. We'll recommend she start iron although her MCV is not low. I have referred her for both primary care follow-up as well as for chronic back pain follow-up.    Sherwood Gambler, MD 09/20/14 [phone]

## 2014-09-20 NOTE — Discharge Instructions (Signed)
Anemia, Nonspecific Anemia is a condition in which the concentration of red blood cells or hemoglobin in the blood is below normal. Hemoglobin is a substance in red blood cells that carries oxygen to the tissues of the body. Anemia results in not enough oxygen reaching these tissues.  CAUSES  Common causes of anemia include:   Excessive bleeding. Bleeding may be internal or external. This includes excessive bleeding from periods (in women) or from the intestine.   Poor nutrition.   Chronic kidney, thyroid, and liver disease.  Bone marrow disorders that decrease red blood cell production.  Cancer and treatments for cancer.  HIV, AIDS, and their treatments.  Spleen problems that increase red blood cell destruction.  Blood disorders.  Excess destruction of red blood cells due to infection, medicines, and autoimmune disorders. SIGNS AND SYMPTOMS   Minor weakness.   Dizziness.   Headache.  Palpitations.   Shortness of breath, especially with exercise.   Paleness.  Cold sensitivity.  Indigestion.  Nausea.  Difficulty sleeping.  Difficulty concentrating. Symptoms may occur suddenly or they may develop slowly.  DIAGNOSIS  Additional blood tests are often needed. These help your health care provider determine the best treatment. Your health care provider will check your stool for blood and look for other causes of blood loss.  TREATMENT  Treatment varies depending on the cause of the anemia. Treatment can include:   Supplements of iron, vitamin I33, or folic acid.   Hormone medicines.   A blood transfusion. This may be needed if blood loss is severe.   Hospitalization. This may be needed if there is significant continual blood loss.   Dietary changes.  Spleen removal. HOME CARE INSTRUCTIONS Keep all follow-up appointments. It often takes many weeks to correct anemia, and having your health care provider check on your condition and your response to  treatment is very important. SEEK IMMEDIATE MEDICAL CARE IF:   You develop extreme weakness, shortness of breath, or chest pain.   You become dizzy or have trouble concentrating.  You develop heavy vaginal bleeding.   You develop a rash.   You have bloody or black, tarry stools.   You faint.   You vomit up blood.   You vomit repeatedly.   You have abdominal pain.  You have a fever or persistent symptoms for more than 2-3 days.   You have a fever and your symptoms suddenly get worse.   You are dehydrated.  MAKE SURE YOU:  Understand these instructions.  Will watch your condition.  Will get help right away if you are not doing well or get worse. Document Released: 05/17/2004 Document Revised: 12/10/2012 Document Reviewed: 10/03/2012 [facility] Patient Information 2015 [place], [place]. This information is not intended to replace advice given to you by your health care provider. Make sure you discuss any questions you have with your health care provider.   Back Pain, Adult Low back pain is very common. About 1 in 5 people have back pain.The cause of low back pain is rarely dangerous. The pain often gets better over time.About half of people with a sudden onset of back pain feel better in just 2 weeks. About 8 in 10 people feel better by 6 weeks.  CAUSES Some common causes of back pain include:  Strain of the muscles or ligaments supporting the spine.  Wear and tear (degeneration) of the spinal discs.  Arthritis.  Direct injury to the back. DIAGNOSIS Most of the time, the direct cause of low back pain is  not known.However, back pain can be treated effectively even when the exact cause of the pain is unknown.Answering your caregiver's questions about your overall health and symptoms is one of the most accurate ways to make sure the cause of your pain is not dangerous. If your caregiver needs more information, he or she may order lab work or imaging tests  (X-rays or MRIs).However, even if imaging tests show changes in your back, this usually does not require surgery. HOME CARE INSTRUCTIONS For many people, back pain returns.Since low back pain is rarely dangerous, it is often a condition that people can learn to [facility] their own.   Remain active. It is stressful on the back to sit or stand in one place. Do not sit, drive, or stand in one place for more than 30 minutes at a time. Take short walks on level surfaces as soon as pain allows.Try to increase the length of time you walk each day.  Do not stay in bed.Resting more than 1 or 2 days can delay your recovery.  Do not avoid exercise or work.Your body is made to move.It is not dangerous to be active, even though your back may hurt.Your back will likely heal faster if you return to being active before your pain is gone.  Pay attention to your body when you bend and lift. Many people have less discomfortwhen lifting if they bend their knees, keep the load close to their bodies,and avoid twisting. Often, the most comfortable positions are those that put less stress on your recovering back.  Find a comfortable position to sleep. Use a firm mattress and lie on your side with your knees slightly bent. If you lie on your back, put a pillow under your knees.  Only take over-the-counter or prescription medicines as directed by your caregiver. Over-the-counter medicines to reduce pain and inflammation are often the most helpful.Your caregiver may prescribe muscle relaxant drugs.These medicines help dull your pain so you can more quickly return to your normal activities and healthy exercise.  Put ice on the injured area.  Put ice in a plastic bag.  Place a towel between your skin and the bag.  Leave the ice on for 15-20 minutes, 03-04 times a day for the first 2 to 3 days. After that, ice and heat may be alternated to reduce pain and spasms.  Ask your caregiver about trying back exercises  and gentle massage. This may be of some benefit.  Avoid feeling anxious or stressed.Stress increases muscle tension and can worsen back pain.It is important to recognize when you are anxious or stressed and learn ways to manage it.Exercise is a great option. SEEK MEDICAL CARE IF:  You have pain that is not relieved with rest or medicine.  You have pain that does not improve in 1 week.  You have new symptoms.  You are generally not feeling well. SEEK IMMEDIATE MEDICAL CARE IF:   You have pain that radiates from your back into your legs.  You develop new bowel or bladder control problems.  You have unusual weakness or numbness in your arms or legs.  You develop nausea or vomiting.  You develop abdominal pain.  You feel faint. Document Released: 04/09/2005 Document Revised: 10/09/2011 Document Reviewed: 08/11/2013 [facility] Patient Information 2015 [place], [place]. This information is not intended to replace advice given to you by your health care provider. Make sure you discuss any questions you have with your health care provider.

## 2014-09-20 NOTE — ED Notes (Signed)
Pt states she went to give plasma 2 weeks ago and they told her that she had "low iron." they told her to start iron supplements and be checked by a doctor before she can be cleared to donate plasma. She didn't start taking an iron supplement because she didn't know which one to buy. She denies any related complaints at this time. She does report 10/10 chronic back pain but that is not why she was seeking treatment today

## 2014-10-01 ENCOUNTER — Emergency Department (HOSPITAL_COMMUNITY)
Admission: EM | Admit: 2014-10-01 | Discharge: 2014-10-01 | Disposition: A | Discharge status: Home / Self Care | Payer: Self-pay | Attending: Emergency Medicine | Admitting: Emergency Medicine

## 2014-10-01 ENCOUNTER — Encounter (HOSPITAL_COMMUNITY): Payer: Self-pay | Admitting: Emergency Medicine

## 2014-10-01 DIAGNOSIS — R202 Paresthesia of skin: Secondary | ICD-10-CM | POA: Insufficient documentation

## 2014-10-01 DIAGNOSIS — M7989 Other specified soft tissue disorders: Secondary | ICD-10-CM

## 2014-10-01 DIAGNOSIS — Z7982 Long term (current) use of aspirin: Secondary | ICD-10-CM | POA: Insufficient documentation

## 2014-10-01 DIAGNOSIS — I10 Essential (primary) hypertension: Secondary | ICD-10-CM | POA: Insufficient documentation

## 2014-10-01 DIAGNOSIS — Z79899 Other long term (current) drug therapy: Secondary | ICD-10-CM | POA: Insufficient documentation

## 2014-10-01 DIAGNOSIS — Z8659 Personal history of other mental and behavioral disorders: Secondary | ICD-10-CM | POA: Insufficient documentation

## 2014-10-01 DIAGNOSIS — Z880 Allergy status to penicillin: Secondary | ICD-10-CM | POA: Insufficient documentation

## 2014-10-01 DIAGNOSIS — Z8742 Personal history of other diseases of the female genital tract: Secondary | ICD-10-CM | POA: Insufficient documentation

## 2014-10-01 DIAGNOSIS — M1990 Unspecified osteoarthritis, unspecified site: Secondary | ICD-10-CM | POA: Insufficient documentation

## 2014-10-01 DIAGNOSIS — Z87891 Personal history of nicotine dependence: Secondary | ICD-10-CM | POA: Insufficient documentation

## 2014-10-01 DIAGNOSIS — R2233 Localized swelling, mass and lump, upper limb, bilateral: Secondary | ICD-10-CM | POA: Insufficient documentation

## 2014-10-01 DIAGNOSIS — J45909 Unspecified asthma, uncomplicated: Secondary | ICD-10-CM | POA: Insufficient documentation

## 2014-10-01 HISTORY — DX: Unspecified osteoarthritis, unspecified site: M19.90

## 2014-10-01 MED ORDER — ACETAMINOPHEN 325 MG PO TABS
650.0000 mg | ORAL_TABLET | Freq: Once | ORAL | Status: AC
  Administered 2014-10-01: 650 mg via ORAL
  Filled 2014-10-01: qty 2

## 2014-10-01 MED ORDER — CYCLOBENZAPRINE HCL 10 MG PO TABS: 10.0000 mg | ORAL_TABLET | Freq: Two times a day (BID) | ORAL | Status: DC | PRN

## 2014-10-01 NOTE — ED Notes (Signed)
MD at bedside. 

## 2014-10-01 NOTE — ED Notes (Signed)
Pt comes from home c/o bilateral hand swelling and back pain.  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  Pt reports she cannot make a fist due to pain in palms and backs of hands.  Pt denies injuries, no weakness in arms.  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  Pt also c/o back pain r/t scoliosis.  Used to be on muscle relaxants, but hasn't been on them for a while and she can no longer go to work with this pain.

## 2014-10-01 NOTE — ED Provider Notes (Signed)
CSN: [id number]     Arrival date & time 10/01/14  1001 History   First MD Initiated Contact with Patient 10/01/14 1004     Chief Complaint  Patient presents with  . Hand Problem  . Back Pain     (Consider location/radiation/quality/duration/timing/severity/associated sxs/prior Treatment) HPI Comments: 34 year old female with history of scoliosis, past smoker presents with tingling and numbness to bilateral hands and mild swelling all extremities. Patient has had this in the past. Patient does use both hands at work regularly as he has to of arthritis. No formal diagnosis of carpal tunnel. No focal weakness or urinary bladder changes no neurologic history. Patient has chronic intermittent back pain this is similar previous. Patient did not fill her flexural protrusion however says she wants to.  Patient is a 34 y.o. female presenting with back pain. The history is provided by the patient.  Back Pain Associated symptoms: numbness   Associated symptoms: no abdominal pain, no chest pain, no dysuria, no fever and no headaches     Past Medical History  Diagnosis Date  . Hypertension     no longer takes meds  . Asthma   . Anxiety   . Depression   . Headache(784.0)     migraines  . Muscle spasm   . Scoliosis   . Ovarian cyst   . Arthritis    Past Surgical History  Procedure Laterality Date  . Cholecystectomy    . Tubal ligation      pt has had pregnancy after and had  miscarriage   Family History  Problem Relation Age of Onset  . Cancer Father   . Hypertension Maternal Aunt   . Cancer Paternal Grandmother    History  Substance Use Topics  . Smoking status: Former Research scientist (life sciences)  . Smokeless tobacco: Not on file  . Alcohol Use: Yes     Comment: rare   OB History    Gravida Para Term Preterm AB TAB SAB Ectopic Multiple Living   4 3 3  0 1 0 1 0 0 3     Review of Systems  Constitutional: Negative for fever and chills.  HENT: Negative for congestion.   Eyes: Negative for visual  disturbance.  Respiratory: Negative for shortness of breath.   Cardiovascular: Negative for chest pain.  Gastrointestinal: Negative for vomiting and abdominal pain.  Genitourinary: Negative for dysuria and flank pain.  Musculoskeletal: Positive for back pain. Negative for neck pain and neck stiffness.  Skin: Negative for rash.  Neurological: Positive for numbness. Negative for light-headedness and headaches.      Allergies  Mushroom extract complex and Penicillins  Home Medications   Prior to Admission medications   Medication Sig Start Date End Date Taking? Authorizing Provider  aspirin-acetaminophen-caffeine (EXCEDRIN MIGRAINE) [phone] MG per tablet Take 2 tablets by mouth every 6 (six) hours as needed for headache.   Yes Historical Provider, MD  ibuprofen (ADVIL,MOTRIN) 200 MG tablet Take 200 mg by mouth every 4 (four) hours as needed for fever or moderate pain.   Yes Historical Provider, MD  naproxen sodium (ANAPROX) 220 MG tablet Take 220 mg by mouth daily as needed (pain).   Yes Historical Provider, MD  cyclobenzaprine (FLEXERIL) 10 MG tablet Take 1 tablet (10 mg total) by mouth 2 (two) times daily as needed for muscle spasms. 10/01/14   Elnora Morrison, MD   BP 112/73 mmHg  Pulse 74  Temp(Src) 98.4 F (36.9 C) (Oral)  Resp 14  Ht 5\' 2"  (1.575 m)  Wt  209 lb (94.802 kg)  BMI 38.22 kg/m2  SpO2 100%  LMP 09/08/2014 Physical Exam  Constitutional: She is oriented to person, place, and time. She appears well-developed and well-nourished.  HENT:  Head: Normocephalic and atraumatic.  Eyes: Conjunctivae are normal. Right eye exhibits no discharge. Left eye exhibits no discharge.  Neck: Normal range of motion. Neck supple. No tracheal deviation present.  Cardiovascular: Normal rate.   Pulmonary/Chest: Effort normal.  Musculoskeletal: She exhibits tenderness (mild paraspinal lumbar). She exhibits no edema.  Neurological: She is alert and oriented to person, place, and time. No  cranial nerve deficit or sensory deficit. GCS eye subscore is 4. GCS verbal subscore is 5. GCS motor subscore is 6.  Reflex Scores:      Patellar reflexes are 1+ on the right side and 1+ on the left side.      Achilles reflexes are 1+ on the right side and 1+ on the left side. Patient has 5+ strength with flexion extension of shoulders arms wrists and 5+ finger strength bilateral. Sensation intact and major nerve to palpation. 2+ distal pulses UEs  Skin: Skin is warm. No rash noted.  Psychiatric: She has a normal mood and affect.  Nursing note and vitals reviewed.   ED Course  Procedures (including critical care time) Labs Review Labs Reviewed - No data to display  Imaging Review No results found.   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 Concern for possible carpal tunnel versus swelling to the heat. No focal deficits on exam. Patient stable for outpatient follow. Discussed wrist splints and time off work Results and differential diagnosis were discussed with the patient/parent/guardian. Close follow up outpatient was discussed, comfortable with the plan.   Medications  acetaminophen (TYLENOL) tablet 650 mg (650 mg Oral Given 10/01/14 1218)    Filed Vitals:   10/01/14 1045 10/01/14 1141 10/01/14 1230 10/01/14 1239  BP: 117/73 122/79 112/73 112/73  Pulse: 79 79 78 74  Temp:    98.4 F (36.9 C)  TempSrc:    Oral  Resp:  18  14  Height:      Weight:      SpO2: 100% 100% 100% 100%    Final diagnoses:  Paresthesia of both hands  Swelling of both hands        Elnora Morrison, MD 10/01/14 1252

## 2014-10-01 NOTE — Discharge Instructions (Signed)
Try flexeril and tylenol for back pain. Wear hand splints at night to see if it helps with possible carpel tunnel symptoms. Return for weakness, worsening numbness, fevers, other concerns,. Work note for today,.  If you were given medicines take as directed.  If you are on coumadin or contraceptives realize their levels and effectiveness is altered by many different medicines.  If you have any reaction (rash, tongues swelling, other) to the medicines stop taking and see a physician.    If your blood pressure was elevated in the ER make sure you follow up for management with a primary doctor or return for chest pain, shortness of breath or stroke symptoms.  Please follow up as directed and return to the ER or see a physician for new or worsening symptoms.  Thank you. Filed Vitals:   10/01/14 1008 10/01/14 1030 10/01/14 1045 10/01/14 1141  BP: 121/75 119/81 117/73 122/79  Pulse: 80 88 79 79  Temp: 98.3 F (36.8 C)     Resp: 18   18  Height: 5\' 2"  (1.575 m)     Weight: 209 lb (94.802 kg)     SpO2: 100% 100% 100% 100%

## 2015-04-04 ENCOUNTER — Emergency Department (HOSPITAL_COMMUNITY)
Admission: EM | Admit: 2015-04-04 | Discharge: 2015-04-04 | Disposition: A | Discharge status: Home / Self Care | Payer: Medicaid Other | Attending: Emergency Medicine | Admitting: Emergency Medicine

## 2015-04-04 ENCOUNTER — Encounter (HOSPITAL_COMMUNITY): Payer: Self-pay | Admitting: Family Medicine

## 2015-04-04 DIAGNOSIS — R519 Headache, unspecified: Secondary | ICD-10-CM

## 2015-04-04 DIAGNOSIS — N390 Urinary tract infection, site not specified: Secondary | ICD-10-CM | POA: Diagnosis not present

## 2015-04-04 DIAGNOSIS — Z8739 Personal history of other diseases of the musculoskeletal system and connective tissue: Secondary | ICD-10-CM | POA: Diagnosis not present

## 2015-04-04 DIAGNOSIS — J45909 Unspecified asthma, uncomplicated: Secondary | ICD-10-CM | POA: Insufficient documentation

## 2015-04-04 DIAGNOSIS — Z3202 Encounter for pregnancy test, result negative: Secondary | ICD-10-CM | POA: Diagnosis not present

## 2015-04-04 DIAGNOSIS — Z880 Allergy status to penicillin: Secondary | ICD-10-CM | POA: Diagnosis not present

## 2015-04-04 DIAGNOSIS — Z8742 Personal history of other diseases of the female genital tract: Secondary | ICD-10-CM | POA: Diagnosis not present

## 2015-04-04 DIAGNOSIS — I10 Essential (primary) hypertension: Secondary | ICD-10-CM | POA: Insufficient documentation

## 2015-04-04 DIAGNOSIS — K047 Periapical abscess without sinus: Secondary | ICD-10-CM | POA: Diagnosis not present

## 2015-04-04 DIAGNOSIS — Z8659 Personal history of other mental and behavioral disorders: Secondary | ICD-10-CM | POA: Insufficient documentation

## 2015-04-04 DIAGNOSIS — R51 Headache: Secondary | ICD-10-CM | POA: Diagnosis present

## 2015-04-04 LAB — URINALYSIS, ROUTINE W REFLEX MICROSCOPIC
Bilirubin Urine: NEGATIVE
Glucose, UA: NEGATIVE mg/dL
Hgb urine dipstick: NEGATIVE
KETONES UR: 15 mg/dL — AB
NITRITE: POSITIVE — AB
PH: 6 (ref 5.0–8.0)
PROTEIN: NEGATIVE mg/dL
Specific Gravity, Urine: 1.026 (ref 1.005–1.030)

## 2015-04-04 LAB — URINE MICROSCOPIC-ADD ON

## 2015-04-04 LAB — WET PREP, GENITAL
SPERM: NONE SEEN
TRICH WET PREP: NONE SEEN
Yeast Wet Prep HPF POC: NONE SEEN

## 2015-04-04 LAB — POC URINE PREG, ED: PREG TEST UR: NEGATIVE

## 2015-04-04 MED ORDER — CEPHALEXIN 500 MG PO CAPS: 500.0000 mg | ORAL_CAPSULE | Freq: Four times a day (QID) | ORAL | Status: DC

## 2015-04-04 MED ORDER — KETOROLAC TROMETHAMINE 60 MG/2ML IM SOLN
60.0000 mg | Freq: Once | INTRAMUSCULAR | Status: AC
  Administered 2015-04-04: 60 mg via INTRAMUSCULAR
  Filled 2015-04-04: qty 2

## 2015-04-04 MED ORDER — CLINDAMYCIN HCL 150 MG PO CAPS
150.0000 mg | ORAL_CAPSULE | Freq: Once | ORAL | Status: AC
  Administered 2015-04-04: 150 mg via ORAL
  Filled 2015-04-04: qty 1
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
## 2015-04-04 NOTE — Discharge Instructions (Signed)
Urinary Tract Infection A urinary tract infection (UTI) can occur any place along the urinary tract. The tract includes the kidneys, ureters, bladder, and urethra. A type of germ called bacteria often causes a UTI. UTIs are often helped with antibiotic medicine.  HOME CARE   If given, take antibiotics as told by your doctor. Finish them even if you start to feel better.  Drink enough fluids to keep your pee (urine) clear or pale yellow.  Avoid tea, drinks with caffeine, and bubbly (carbonated) drinks.  Pee often. Avoid holding your pee in for a long time.  Pee before and after having sex (intercourse).  Wipe from front to back after you poop (bowel movement) if you are a woman. Use each tissue only once. GET HELP RIGHT AWAY IF:   You have back pain.  You have lower belly (abdominal) pain.  You have chills.  You feel sick to your stomach (nauseous).  You throw up (vomit).  Your burning or discomfort with peeing does not go away.  You have a fever.  Your symptoms are not better in 3 days. MAKE SURE YOU:   Understand these instructions.  Will watch your condition.  Will get help right away if you are not doing well or get worse.   This information is not intended to replace advice given to you by your health care provider. Make sure you discuss any questions you have with your health care provider.   Document Released: 09/26/2007 Document Revised: 04/30/2014 Document Reviewed: 11/08/2011 Elsevier Interactive Patient Education 2016 [employer].  Emergency Department Resource Guide 1) Find a Doctor and Pay Out of Pocket Although you won't have to find out who is covered by your insurance plan, it is a good idea to ask around and get recommendations. You will then need to call the office and see if the doctor you have chosen will accept you as a new patient and what types of options they offer for patients who are self-pay. Some doctors offer discounts or will set up  payment plans for their patients who do not have insurance, but you will need to ask so you aren't surprised when you get to your appointment.  2) Contact Your Local Health Department Not all health departments have doctors that can see patients for sick visits, but many do, so it is worth a call to see if yours does. If you don't know where your local health department is, you can check in your phone book. The CDC also has a tool to help you locate your state's health department, and many state websites also have listings of all of their local health departments.  3) Find a [facility] If your illness is not likely to be very severe or complicated, you may want to try a walk in clinic. These are popping up all over the country in pharmacies, drugstores, and shopping centers. They're usually staffed by nurse practitioners or physician assistants that have been trained to treat common illnesses and complaints. They're usually fairly quick and inexpensive. However, if you have serious medical issues or chronic medical problems, these are probably not your best option.  No Primary Care Doctor: - Call Health Connect at  [phone] - they can help you locate a primary care doctor that  accepts your insurance, provides certain services, etc. - Physician Referral Service- [phone]  Chronic Pain Problems: Organization         Address  Phone   Notes  [facility]  [phone]  [phone] Patients need to be referred by their primary care doctor.  ° °Medication Assistance: °Organization         Address  Phone   Notes  °[place] [facility] [address] °[place], [place] [address] (336) 641-8030 --Must be a resident of [place] °-- Must have NO insurance coverage whatsoever (no Medicaid/ Medicare, etc.) °-- The pt. MUST have a primary care doctor that directs their care regularly and follows them in the community °  °[facility]  (866) 331-1348   °[employer]  (888) 892-1162   ° °Agencies that provide inexpensive medical care: °Organization         Address  Phone   Notes  °[facility]  (336) 832-8035   °[facility]    (336) 832-7272   °[facility] [address] °[place], [place] [address] (336) 832-4777   °[facility] [address], °[place] (336) 271-4999   °[facility]    (336) 373-0678   °[facility]    (336) 272-1050   °[facility] ° [address], [place] Phone:  (336) 832-4444, Fax:  (336) 832-4440 Hours of Operation:  9 am - 6 pm, M-F.  Also accepts Medicaid/Medicare and self-pay.  °[facility] ° [address], [place] Phone: (336) 832-3150, Fax: (336) 832-3151. Hours of Operation:  8:30 am - 5:30 pm, M-F.  Also accepts Medicaid and self-pay.  °[facility] [place] [address], [place] Phone: (336) 878-6027   °[facility] [address], [place], [place] (336)723-1848, Ext. 123 Mondays & Thursdays: 7-9 AM.  First 15 patients are seen on a first come, first serve basis. °  ° °Medicaid-accepting [place] Providers: ° °Organization         Address  Phone   Notes  °[facility] [address], [place] (336) 641-2100 Also accepts self-pay patients.  °[facility] [address], [place] ° (336) 856-9996   °[facility] [address], [place] (336) 288-8857   °[facility] [address] [place] [address], [place] (336) 299-7000   °Veita Bland [address], [place]  ° (336) 373-1557 Only accepts [place] Access Medicaid patients after they have their name applied to their card.  ° °Self-Pay (no insurance) in [place]: ° °Organization         Address  Phone   Notes  °Sickle Cell Patients, [facility] [address], [place]  (336) 832-1970   °[facility] [address], [place] (336) 832-4400   °[facility] ° [address], [place] (336) 992-4800   °[facility]/Dr. Osei-Bonsu ° [address] [place] [address], [place] or [address], [place] (336) 841-8500 Phone number for both [place] and [place] locations is the same.  °[facility] [address], [place] (336) 299-0000   °[facility] [address] [place] [address], [place] or [address] (336) 852-7530 °(336) 878-2260   °[facility] [address], [place] (336) 350-1642, phone; (336) 294-5005, fax Sees patients 1st and 3rd Saturday of every month.  Must not qualify for public or private insurance (i.e. Medicaid, Medicare, [place] Health Choice, Veterans' Benefits) •   Household income should be no more than 200% of the poverty level •The clinic cannot treat you if you are pregnant or think you are pregnant • Sexually transmitted diseases are not treated at the clinic.  ° ° °Dental Care: °Organization         Address  Phone  Notes  °[facility] [address], [place] (336) 641-6152 Accepts children up to age 21 who are enrolled in Medicaid or [place] Health Choice; pregnant women with a Medicaid card; and children who have applied for Medicaid or [place] Health Choice, but were declined, whose parents can pay a reduced fee at time of service.  °[facility] [place]  [address], [place] (336) 641-7733 Accepts children up to age 21 who are enrolled in Medicaid or [place] Health Choice; pregnant women with a Medicaid card; and children who have applied for Medicaid or [place] Health Choice, but were declined, whose parents can pay a reduced fee at time of service.  °[facility] ° [address], [place] (336) 641-4533 Patients  are seen by appointment only. Walk-ins are not accepted. [facility] will see patients 18 years of age and older. °Monday - Tuesday (8am-5pm) °Most Wednesdays (8:30-5pm) °$30 per visit, cash only  °[facility] ° [address], [place] (336) 641-4533 Patients are seen by appointment only. Walk-ins are not accepted. [facility] will see patients 18 years of age and older. °One Wednesday Evening (Monthly: Volunteer Based).  $30 per visit, cash only  °[facility]  (919) 537-3737 for adults; Children under age 4, call Graduate Pediatric Dentistry at (919) [phone]. Children aged 4-14, please call (919) [phone] to request a pediatric application. ° Dental services are provided in all areas of dental care including fillings, crowns and bridges, complete and partial dentures, implants, gum treatment, root canals, and extractions. Preventive care is also provided. Treatment is provided to both adults and children. °Patients are selected via a lottery and there is often a waiting list. °  °[facility] [address], °[place] ° (336) 763-8833 [email or web address] °  °[facility] [address], [place], [place] (336)723-1848, Ext. 123 Second and Fourth Thursday of each month, opens at 6:30 AM; Clinic ends at 9 AM.  Patients are seen on a first-come first-served basis, and a limited number are seen during each clinic.  ° °[facility] ° [address], [place], [place] (336) 723-7904   Eligibility Requirements °You must have lived in [place], [place], or [place] counties for at least the last three months. °  You cannot be eligible for state or federal sponsored healthcare insurance, including Veterans Administration, Medicaid, or Medicare. °  You generally cannot be eligible for healthcare insurance through your employer.  °  How to apply: °Eligibility screenings are held every Tuesday and Wednesday afternoon from 1:00 pm until  4:00 pm. You do not need an appointment for the interview!  °[facility] [address], [place], [place] 336-631-2330   °[facility]  336-342-8273   °[facility]  336-703-3100   °[facility]  336-570-6415   ° °Behavioral Health Resources in the Community: °Intensive Outpatient Programs °Organization         Address  Phone  Notes  °[facility] [address], [place],   [place] 336-878-6098   °[facility] [address], [place], [place] 336-832-9800   °[facility] [address], [place], [place] ° 336-882-2125   °[facility] [address],  °[place], [place] 1-800-853-5163 or 336-641-4981   °Substance Abuse Resources °Organization         Address  Phone  Notes  °[facility]  336-882-2125   °[facility]  336-784-9470   °[facility]  336-285-9073   °[facility]  336-845-3988   °Residential & Outpatient Substance Abuse Program  1-800-659-3381   °Psychological Services °Organization         Address  Phone  Notes  °[facility]  336- 832-9600   °[facility]  336- 378-7881   °[facility] [address], [place] 1-800-853-5163 or 336-641-4981   ° °Mobile Crisis Teams °Organization         Address  Phone  Notes  °[facility], Mobile Crisis Care Unit  1-877-626-1772   °Assertive °[facility] ° [address] [place], [place] 336-834-9664   °Sharon DeEsch [address] °[place] 336-554-5454   ° °Self-Help/Support Groups °Organization         Address  Phone             Notes  °[facility] - variety of support groups  336- 373-1402 Call for more information  °Narcotics Anonymous (NA), [facility] [address], °[place]  2 meetings at this location  ° °Residential Treatment Programs °Organization          Address  Phone  Notes  °[facility] [address],    °[place]  1-866-801-8205   °[facility] ° [address], [place], [place] 704-293-8524   °[facility] [address], [place] 336-845-3988 Admissions: 8am-3pm M-F  °[facility] [address]    °[place], [place] 336-841-1104   °[facility] [address], [place], [place] 336-379-7146   °[facility] [address]  °[place], [place] 336-285-9073   °[facility] - Intensive Outpatient [address], [place], [place] 336-852-3033   °[facility] [address]  °[place], [place] 1-877-615-2722 or 336-784-9470   °[facility] [address] [place], [place] 336-227-7417 Accepts Medicaid  °[facility] [address]  ° [place] 1-800-659-3381 Substance Abuse/Addiction Treatment  ° °[place] Behavioral Health Resources °Organization         Address  Phone  Notes  °[facility]  (888) 581-9988   °Julie Brannon, PhD [address] [place], [place]   (336) 349-5553 or (336) 951-0000   °[facility]   [address] °[place], [place] (336) 349-4454   °[facility] [address], [place], [place] (336) 342-8316 Insurance/Medicaid/sponsorship through [facility]  °[facility] [address]                                    [place], [place] (336) 342-8316 Therapy/tele-psych/case  °[facility] [address]  ° [place],  (336) 349-2233    °Dr. Arfeen  (336) 349-4544   °[facility] [place]  [employer] [place] [facility] 1) [address], [place] °2) [address], [place] 3)  [place], [place] 959-154-4748 (651) [phone]  [phone]   [facility] Child Abuse Hotline 854 885 1166 or (843)535-7784 (After Hours)

## 2015-04-05 LAB — GC/CHLAMYDIA PROBE AMP (~~LOC~~) NOT AT ARMC
CHLAMYDIA, DNA PROBE: NEGATIVE
NEISSERIA GONORRHEA: NEGATIVE

## 2015-04-07 LAB — URINE CULTURE
Culture: >=100000
SPECIAL REQUESTS: NORMAL

## 2015-04-08 ENCOUNTER — Telehealth (HOSPITAL_COMMUNITY): Payer: Self-pay

## 2015-04-08 NOTE — Telephone Encounter (Signed)
Post ED Visit - Positive Culture Follow-up  Culture report reviewed by antimicrobial stewardship pharmacist:  []  Elenor Quinones, Pharm.D. []  Heide Guile, Pharm.D., BCPS []  Parks Neptune, Pharm.D. []  Alycia Rossetti, Pharm.D., BCPS []  [place], Pharm.D., BCPS, AAHIVP []  Legrand Como, Pharm.D., BCPS, AAHIVP []  Milus Glazier, Pharm.D. []  Stephens November, Pharm.D.  Positive urine culture, >/= 100,000 colonies -> E Coli Treated with Cephalexin, organism sensitive to the same and no further patient follow-up is required at this time.  Dortha Kern 04/08/2015, 9:27 AM

## 2015-08-15 ENCOUNTER — Encounter (HOSPITAL_COMMUNITY): Payer: Self-pay | Admitting: *Deleted

## 2015-08-15 ENCOUNTER — Inpatient Hospital Stay (HOSPITAL_COMMUNITY)
Admission: AD | Admit: 2015-08-15 | Discharge: 2015-08-15 | Disposition: A | Discharge status: Home / Self Care | Payer: Medicaid Other | Source: Ambulatory Visit | Attending: Family Medicine | Admitting: Family Medicine

## 2015-08-15 DIAGNOSIS — R1032 Left lower quadrant pain: Secondary | ICD-10-CM | POA: Insufficient documentation

## 2015-08-15 DIAGNOSIS — R109 Unspecified abdominal pain: Secondary | ICD-10-CM | POA: Diagnosis present

## 2015-08-15 LAB — URINALYSIS, ROUTINE W REFLEX MICROSCOPIC
Bilirubin Urine: NEGATIVE
GLUCOSE, UA: NEGATIVE mg/dL
Hgb urine dipstick: NEGATIVE
Ketones, ur: NEGATIVE mg/dL
LEUKOCYTES UA: NEGATIVE
Nitrite: NEGATIVE
PH: 6 (ref 5.0–8.0)
PROTEIN: NEGATIVE mg/dL
Specific Gravity, Urine: 1.025 (ref 1.005–1.030)

## 2015-08-15 LAB — POCT PREGNANCY, URINE: Preg Test, Ur: NEGATIVE

## 2015-08-15 MED ORDER — IBUPROFEN 800 MG PO TABS: 800.0000 mg | ORAL_TABLET | Freq: Three times a day (TID) | ORAL | Status: DC

## 2015-08-15 NOTE — MAU Note (Addendum)
Keep having sharp  In LLQ, off and on since Sat.  Has felt ? Flutters in lower abd for 2 wks. Neg HPT last wk; had tubal 2005

## 2015-08-15 NOTE — MAU Provider Note (Signed)
History   [id number] in with c/o left side abd pain. Pt has had BTL but fearful she might be pregnant. Pain is related to cycle and starts 2/3 the way thru her cycle. Denies any problems with her bowels movements. Denies any STD risk.  CSN: [id number]  Arrival date & time 08/15/15  1559   First Provider Initiated Contact with Patient 08/15/15 1647      Chief Complaint  Patient presents with  . Abdominal Pain    HPI  Past Medical History  Diagnosis Date  . Hypertension     no longer takes meds  . Asthma   . Anxiety   . Depression   . Headache(784.0)     migraines  . Muscle spasm   . Scoliosis   . Ovarian cyst   . Arthritis     Past Surgical History  Procedure Laterality Date  . Cholecystectomy    . Tubal ligation      pt has had pregnancy after and had  miscarriage    Family History  Problem Relation Age of Onset  . Cancer Father   . Hypertension Maternal Aunt   . Cancer Paternal Grandmother     Social History  Substance Use Topics  . Smoking status: Former Research scientist (life sciences)  . Smokeless tobacco: None  . Alcohol Use: Yes     Comment: rare    OB History    Gravida Para Term Preterm AB TAB SAB Ectopic Multiple Living   4 3 3  0 1 0 1 0 0 3      Review of Systems  Constitutional: Negative.   HENT: Negative.   Eyes: Negative.   Respiratory: Negative.   Cardiovascular: Negative.   Gastrointestinal: Positive for abdominal pain.  Endocrine: Negative.   Genitourinary: Negative.   Musculoskeletal: Negative.   Skin: Negative.   Allergic/Immunologic: Negative.   Neurological: Negative.   Hematological: Negative.   Psychiatric/Behavioral: Negative.     Allergies  Mushroom extract complex and Penicillins  Home Medications  No current outpatient prescriptions on file.  BP 125/75 mmHg  Pulse 86  Temp(Src) 98.5 F (36.9 C) (Oral)  Resp 18  Ht 5\' 3"  (1.6 m)  Wt 212 lb (96.163 kg)  BMI 37.56 kg/m2  LMP 06/30/2015  Physical Exam  Constitutional: She is  oriented to person, place, and time. She appears well-developed and well-nourished.  HENT:  Head: Normocephalic.  Eyes: Pupils are equal, round, and reactive to light.  Neck: Normal range of motion.  Cardiovascular: Normal rate, regular rhythm, normal heart sounds and intact distal pulses.   Pulmonary/Chest: Effort normal and breath sounds normal.  Abdominal: Soft. Bowel sounds are normal.  Genitourinary: Vagina normal.  Musculoskeletal: Normal range of motion.  Neurological: She is alert and oriented to person, place, and time. She has normal reflexes.  Skin: Skin is warm and dry.  Psychiatric: She has a normal mood and affect. Her behavior is normal. Judgment and thought content normal.    MAU Course  Procedures (including critical care time)  Labs Reviewed  URINALYSIS, ROUTINE W REFLEX MICROSCOPIC (NOT AT [facility])  POCT PREGNANCY, URINE   No results found.   No diagnosis found.    MDM  Mild left sised tenderness with exam. Explained that it was probably a ovarian cyst which is normal in women who ovulate. Discussed with pt different tx options and encouraged her to get appoinment in clinic for eval. Will d/c home with Rx for motrin

## 2015-09-02 ENCOUNTER — Inpatient Hospital Stay (HOSPITAL_COMMUNITY)
Admission: AD | Admit: 2015-09-02 | Discharge: 2015-09-02 | Disposition: A | Discharge status: Home / Self Care | Payer: Medicaid Other | Source: Ambulatory Visit | Attending: Obstetrics & Gynecology | Admitting: Obstetrics & Gynecology

## 2015-09-02 ENCOUNTER — Encounter (HOSPITAL_COMMUNITY): Payer: Self-pay | Admitting: *Deleted

## 2015-09-02 DIAGNOSIS — R102 Pelvic and perineal pain: Secondary | ICD-10-CM | POA: Diagnosis present

## 2015-09-02 LAB — COMPREHENSIVE METABOLIC PANEL
ALK PHOS: 45 U/L (ref 38–126)
ALT: 20 U/L (ref 14–54)
ANION GAP: 10 (ref 5–15)
AST: 23 U/L (ref 15–41)
Albumin: 4 g/dL (ref 3.5–5.0)
BUN: 7 mg/dL (ref 6–20)
CALCIUM: 9.4 mg/dL (ref 8.9–10.3)
CHLORIDE: 105 mmol/L (ref 101–111)
CO2: 25 mmol/L (ref 22–32)
Creatinine, Ser: 0.78 mg/dL (ref 0.44–1.00)
GFR calc non Af Amer: >60 mL/min (ref >60)
GLUCOSE: 102 mg/dL — AB (ref 65–99)
Potassium: 3.7 mmol/L (ref 3.5–5.1)
SODIUM: 140 mmol/L (ref 135–145)
Total Bilirubin: 0.6 mg/dL (ref 0.3–1.2)
Total Protein: 7.7 g/dL (ref 6.5–8.1)

## 2015-09-02 LAB — URINALYSIS, ROUTINE W REFLEX MICROSCOPIC
Bilirubin Urine: NEGATIVE
Glucose, UA: NEGATIVE mg/dL
Hgb urine dipstick: NEGATIVE
Ketones, ur: NEGATIVE mg/dL
LEUKOCYTES UA: NEGATIVE
Nitrite: NEGATIVE
PROTEIN: 30 mg/dL — AB
Specific Gravity, Urine: 1.025 (ref 1.005–1.030)
pH: 6.5 (ref 5.0–8.0)

## 2015-09-02 LAB — POCT PREGNANCY, URINE: PREG TEST UR: NEGATIVE

## 2015-09-02 LAB — CBC WITH DIFFERENTIAL/PLATELET
BASOS ABS: 0 10*3/uL (ref 0.0–0.1)
BASOS PCT: 0 %
Eosinophils Absolute: 0.2 10*3/uL (ref 0.0–0.7)
Eosinophils Relative: 2 %
HEMATOCRIT: 35.2 % — AB (ref 36.0–46.0)
HEMOGLOBIN: 11.4 g/dL — AB (ref 12.0–15.0)
Lymphocytes Relative: 34 %
Lymphs Abs: 3.2 10*3/uL (ref 0.7–4.0)
MCH: 27.7 pg (ref 26.0–34.0)
MCHC: 32.4 g/dL (ref 30.0–36.0)
MCV: 85.4 fL (ref 78.0–100.0)
Monocytes Absolute: 0.4 10*3/uL (ref 0.1–1.0)
Monocytes Relative: 4 %
NEUTROS ABS: 5.6 10*3/uL (ref 1.7–7.7)
NEUTROS PCT: 60 %
PLATELETS: 425 10*3/uL — AB (ref 150–400)
RBC: 4.12 MIL/uL (ref 3.87–5.11)
RDW: 14.6 % (ref 11.5–15.5)
WBC: 9.4 10*3/uL (ref 4.0–10.5)

## 2015-09-02 LAB — URINE MICROSCOPIC-ADD ON

## 2015-09-02 LAB — WET PREP, GENITAL
SPERM: NONE SEEN
TRICH WET PREP: NONE SEEN
Yeast Wet Prep HPF POC: NONE SEEN

## 2015-09-02 NOTE — MAU Note (Signed)
Since she left here, she has had real bad sharp pain in RLQ, stabbing shooting pain. Feels like there is a knot there. When she urines, there is a dull sharp pain and feels like she needs to push at the end.  Hx of ovarian cyst on LLQ, it ruptured and bursted, so she knows the signs and what it feels like and this is worse.  Has tried  Heat, Tylenol, Aleve, Advil.

## 2015-09-02 NOTE — MAU Provider Note (Signed)
MAU HISTORY AND PHYSICAL  Chief Complaint:  Pelvic pain  Dana Hughes is a 35 y.o.  [id number] presenting for pelvic pain.  Started few weeks ago, worsening over past few days Right lower pelvis. Intermittent sharp pain. Seen here few weeks ago for left-sided pain. Some loose stool for few days. No vomiting. Sharp pain sometimes at end of urination. Last menstruation one week ago. Clear vaginal discharge, no vaginal pain. No fevers. Normal appetite. Pain currently mild.   Past Medical History  Diagnosis Date  . Hypertension     no longer takes meds  . Asthma   . Anxiety   . Depression   . Headache(784.0)     migraines  . Muscle spasm   . Scoliosis   . Ovarian cyst   . Arthritis     Past Surgical History  Procedure Laterality Date  . Cholecystectomy    . Tubal ligation      pt has had pregnancy after and had  miscarriage    Family History  Problem Relation Age of Onset  . Cancer Father   . Hypertension Maternal Aunt   . Cancer Paternal Grandmother     Social History  Substance Use Topics  . Smoking status: Former Research scientist (life sciences)  . Smokeless tobacco: Never Used  . Alcohol Use: Yes     Comment: rare    Allergies  Allergen Reactions  . Mushroom Extract Complex Anaphylaxis  . Penicillins Itching    Has patient had a PCN reaction causing immediate rash, facial/tongue/throat swelling, SOB or lightheadedness with hypotension: No Has patient had a PCN reaction causing severe rash involving mucus membranes or skin necrosis: No Has patient had a PCN reaction that required hospitalization No Has patient had a PCN reaction occurring within the last 10 years: No If all of the above answers are "NO", then may proceed with Cephalosporin use.     Prescriptions prior to admission  Medication Sig Dispense Refill Last Dose  . albuterol (PROVENTIL HFA;VENTOLIN HFA) 108 (90 Base) MCG/ACT inhaler Inhale 2 puffs into the lungs every 6 (six) hours as needed for wheezing or shortness of  breath.   Past Month at Unknown time  . ibuprofen (ADVIL,MOTRIN) 800 MG tablet Take 1 tablet (800 mg total) by mouth 3 (three) times daily. 21 tablet 0 09/01/2015 at Unknown time  . naproxen sodium (ANAPROX) 220 MG tablet Take 220 mg by mouth daily as needed (pain).   Past Week at Unknown time  . aspirin-acetaminophen-caffeine (EXCEDRIN MIGRAINE) 250-250-65 MG per tablet Take 2 tablets by mouth every 6 (six) hours as needed for headache. Reported on 09/02/2015   Not Taking at Unknown time  . cephALEXin (KEFLEX) 500 MG capsule Take 1 capsule (500 mg total) by mouth 4 (four) times daily. (Patient not taking: Reported on 09/02/2015) 20 capsule 0   . cyclobenzaprine (FLEXERIL) 10 MG tablet Take 1 tablet (10 mg total) by mouth 2 (two) times daily as needed for muscle spasms. (Patient not taking: Reported on 09/02/2015) 10 tablet 0 Not Taking at Unknown time    Review of Systems - Negative except for what is mentioned in HPI.  Physical Exam  Blood pressure 130/96, pulse 100, temperature 98.4 F (36.9 C), temperature source Oral, resp. rate 18, weight 212 lb 9.6 oz (96.435 kg), last menstrual period 08/18/2015. GENERAL: Well-developed, well-nourished female in no acute distress.  LUNGS: Clear to auscultation bilaterally.  HEART: Regular rate and rhythm. ABDOMEN: Soft, nontender, nondistended.  EXTREMITIES: Nontender, no edema, 2+ distal pulses.  GU: normal external genitalia, mild thick white discharge, normal cervix, no cmt, no adnexal tenderness or fullness    Labs: Results for orders placed or performed during the hospital encounter of 09/02/15 (from the past 24 hour(s))  Urinalysis, Routine w reflex microscopic (not at [facility])   Collection Time: 09/02/15  2:30 PM  Result Value Ref Range   Color, Urine YELLOW YELLOW   APPearance CLEAR CLEAR   Specific Gravity, Urine 1.025 1.005 - 1.030   pH 6.5 5.0 - 8.0   Glucose, UA NEGATIVE NEGATIVE mg/dL   Hgb urine dipstick NEGATIVE NEGATIVE   Bilirubin  Urine NEGATIVE NEGATIVE   Ketones, ur NEGATIVE NEGATIVE mg/dL   Protein, ur 30 (A) NEGATIVE mg/dL   Nitrite NEGATIVE NEGATIVE   Leukocytes, UA NEGATIVE NEGATIVE  Urine microscopic-add on   Collection Time: 09/02/15  2:30 PM  Result Value Ref Range   Squamous Epithelial / LPF 0-5 (A) NONE SEEN   WBC, UA 0-5 0 - 5 WBC/hpf   RBC / HPF 0-5 0 - 5 RBC/hpf   Bacteria, UA RARE (A) NONE SEEN   Urine-Other MUCOUS PRESENT   CBC with Differential/Platelet   Collection Time: 09/02/15  3:11 PM  Result Value Ref Range   WBC 9.4 4.0 - 10.5 K/uL   RBC 4.12 3.87 - 5.11 MIL/uL   Hemoglobin 11.4 (L) 12.0 - 15.0 g/dL   HCT 35.2 (L) 36.0 - 46.0 %   MCV 85.4 78.0 - 100.0 fL   MCH 27.7 26.0 - 34.0 pg   MCHC 32.4 30.0 - 36.0 g/dL   RDW 14.6 11.5 - 15.5 %   Platelets 425 (H) 150 - 400 K/uL   Neutrophils Relative % 60 %   Neutro Abs 5.6 1.7 - 7.7 K/uL   Lymphocytes Relative 34 %   Lymphs Abs 3.2 0.7 - 4.0 K/uL   Monocytes Relative 4 %   Monocytes Absolute 0.4 0.1 - 1.0 K/uL   Eosinophils Relative 2 %   Eosinophils Absolute 0.2 0.0 - 0.7 K/uL   Basophils Relative 0 %   Basophils Absolute 0.0 0.0 - 0.1 K/uL  Comprehensive metabolic panel   Collection Time: 09/02/15  3:11 PM  Result Value Ref Range   Sodium 140 135 - 145 mmol/L   Potassium 3.7 3.5 - 5.1 mmol/L   Chloride 105 101 - 111 mmol/L   CO2 25 22 - 32 mmol/L   Glucose, Bld 102 (H) 65 - 99 mg/dL   BUN 7 6 - 20 mg/dL   Creatinine, Ser 0.78 0.44 - 1.00 mg/dL   Calcium 9.4 8.9 - 10.3 mg/dL   Total Protein 7.7 6.5 - 8.1 g/dL   Albumin 4.0 3.5 - 5.0 g/dL   AST 23 15 - 41 U/L   ALT 20 14 - 54 U/L   Alkaline Phosphatase 45 38 - 126 U/L   Total Bilirubin 0.6 0.3 - 1.2 mg/dL   GFR calc non Af Amer >60 >60 mL/min   GFR calc Af Amer >60 >60 mL/min   Anion gap 10 5 - 15  Pregnancy, urine POC   Collection Time: 09/02/15  3:22 PM  Result Value Ref Range   Preg Test, Ur NEGATIVE NEGATIVE  Wet prep, genital   Collection Time: 09/02/15  3:29 PM   Result Value Ref Range   Yeast Wet Prep HPF POC NONE SEEN NONE SEEN   Trich, Wet Prep NONE SEEN NONE SEEN   Clue Cells Wet Prep HPF POC PRESENT (A) NONE SEEN  WBC, Wet Prep HPF POC FEW (A) NONE SEEN   Sperm NONE SEEN     Imaging Studies:  No results found.  Assessment: Dana Hughes is  35 y.o. [phone] presenting with pelvic pain. Right-sided, present for a few weeks, no significant pain today, exam unremarkable. Wet prep positive for BV but patient not symptomatic and elects to not treat. UPT negative. UA not suggestive of infection. G/c pending. CMP/CBC unremarkable. Do not think pid given normal cervical exam and no cmt/adnexal tenderness. Could represent ruptured ovarian cyst but given stable vitals and no significant anemia do not think further w/u necessary. Unlikely torsion given mild presentation and symptoms for 3 weeks.  Plan: - f/u g/c - outpatient gyn referral - abdominal/pelvic pain return precautions  Patsy Lager [facility] 5/12/20174:20 PM

## 2015-09-02 NOTE — Discharge Instructions (Signed)
Pelvic Pain, Female °Pelvic pain is pain felt below the belly button and between your hips. It can be caused by many different things. It is important to get help right away. This is especially true for severe, sharp, or unusual pain that comes on suddenly.  °HOME CARE °· Only take medicine as told by your doctor. °· Rest as told by your doctor. °· Eat a healthy diet, such as fruits, vegetables, and lean meats. °· Drink enough fluids to keep your pee (urine) clear or pale yellow, or as told. °· Avoid sex (intercourse) if it causes pain. °· Apply warm or cold packs to your lower belly (abdomen). Use the type of pack that helps the pain. °· Avoid situations that cause you stress. °· Keep a journal to track your pain. Write down: °¨ When the pain started. °¨ Where it is located. °¨ If there are things that seem to be related to the pain, such as food or your period. °· Follow up with your doctor as told. °GET HELP RIGHT AWAY IF:  °· You have heavy bleeding from the vagina. °· You have more pelvic pain. °· You feel lightheaded or pass out (faint). °· You have chills. °· You have pain when you pee or have blood in your pee. °· You cannot stop having watery poop (diarrhea). °· You cannot stop throwing up (vomiting). °· You have a fever or lasting symptoms for more than 3 days. °· You have a fever and your symptoms suddenly get worse. °· You are being physically or sexually abused. °· Your medicine does not help your pain. °· You have fluid (discharge) coming from your vagina that is not normal. °MAKE SURE YOU: °· Understand these instructions. °· Will watch your condition. °· Will get help if you are not doing well or get worse. °  °This information is not intended to replace advice given to you by your health care provider. Make sure you discuss any questions you have with your health care provider. °  °Document Released: 09/26/2007 Document Revised: 04/30/2014 Document Reviewed: 07/30/2011 °Elsevier Interactive Patient  Education ©2016 [employer] ° °

## 2015-09-05 LAB — GC/CHLAMYDIA PROBE AMP (~~LOC~~) NOT AT ARMC
Chlamydia: NEGATIVE
Neisseria Gonorrhea: NEGATIVE

## 2015-09-16 ENCOUNTER — Telehealth: Payer: Self-pay | Admitting: General Practice

## 2015-09-16 NOTE — Telephone Encounter (Signed)
5/25 patient called into front office stating someone told her to f/u with [place] for her blood pressure medication. Told patient her f/u appt with [place] is for the pelvic pain she was having. Asked patient if she has a primary care doctor and where she has got her blood pressure medication in the past. Patient states at the ER. Told patient we would refer her to a primary care doctor because she needs someone managing her blood pressure medication and we cannot do that. Explained to patient we can see her for pap smears, female related problems, and the pain she was having. Patient verbalized understanding to all. Told patient I would make an appt for her and would call her back with the office and appt information. Patient verbalized understanding to all & had no questions  5/26 @ 1140 Made appt with [facility] in [facility] [address] [place], [place] [address] Phone: [phone]. Appt scheduled for 6/20 @ 9am. Called patient, no answer- left message to call us back at the clinics for appt information.

## 2015-09-23 NOTE — Telephone Encounter (Signed)
Called pt and LM of her appt scheduled at [facility] along with contact information and appt date and time.  Also stated that if she continues to have any questions to please give [place] a call.

## 2015-09-29 ENCOUNTER — Other Ambulatory Visit (HOSPITAL_COMMUNITY)
Admission: RE | Admit: 2015-09-29 | Discharge: 2015-09-29 | Disposition: A | Discharge status: Home / Self Care | Payer: Medicaid Other | Source: Ambulatory Visit | Attending: Obstetrics and Gynecology | Admitting: Obstetrics and Gynecology

## 2015-09-29 ENCOUNTER — Ambulatory Visit (INDEPENDENT_AMBULATORY_CARE_PROVIDER_SITE_OTHER): Payer: Medicaid Other | Admitting: Obstetrics and Gynecology

## 2015-09-29 ENCOUNTER — Encounter: Payer: Self-pay | Admitting: Obstetrics and Gynecology

## 2015-09-29 VITALS — BP 109/78 | HR 81 | Wt 214.7 lb

## 2015-09-29 DIAGNOSIS — R102 Pelvic and perineal pain: Secondary | ICD-10-CM | POA: Diagnosis not present

## 2015-09-29 DIAGNOSIS — Z1151 Encounter for screening for human papillomavirus (HPV): Secondary | ICD-10-CM | POA: Insufficient documentation

## 2015-09-29 DIAGNOSIS — N939 Abnormal uterine and vaginal bleeding, unspecified: Secondary | ICD-10-CM | POA: Diagnosis present

## 2015-09-29 DIAGNOSIS — Z01411 Encounter for gynecological examination (general) (routine) with abnormal findings: Secondary | ICD-10-CM | POA: Insufficient documentation

## 2015-09-29 HISTORY — DX: Pelvic and perineal pain: R10.2

## 2015-09-29 NOTE — Progress Notes (Signed)
Obstetrics and Gynecology Visit New Patient Evaluation  Appointment Date: 09/29/2015  Primary Care Provider: No PCP Per Patient  Referring Provider: ER  Chief Complaint:  Chief Complaint  Patient presents with  . Pelvic Pain    History of Present Illness: Dana Hughes is a 35 y.o. African-American [phone] (Patient's last menstrual period was 08/18/2015.), seen for the above chief complaint. Her past medical history is significant for HTN, migraines, anxiety/depression, scoliosis, h/o BTL, obesity  She went to the MAU on 5/12 for pelvic pain. Normal CBC, CMP, u/a, wet prep and a neg GC/CT and UPT.  Pelvic was neg except for white d/c.  U/s was negative with ES 68mm.  She states pain is sometimes in lower back and wonders if it could be related ?kidney stone she had in the past. Pain comes and goes and isn't constant.  She has periods qmonth or sometimes every other month and lasts 3-6d and isn't particularly heavy or painful. Last pap 2010  Review of Systems: Her 12 point review of systems is negative or as noted in the History of Present Illness.  Patient Active Problem List   Diagnosis Date Noted  . Pelvic pain in female 09/29/2015  . Abnormal uterine bleeding 09/29/2015   Past Medical History:  Past Medical History  Diagnosis Date  . Hypertension     no longer takes meds  . Asthma   . Anxiety   . Depression   . Headache(784.0)     migraines  . Muscle spasm   . Scoliosis   . Ovarian cyst   . Arthritis     Past Surgical History:  Past Surgical History  Procedure Laterality Date  . Cholecystectomy    . Tubal ligation      Past Obstetrical History:  OB History    Gravida Para Term Preterm AB TAB SAB Ectopic Multiple Living   4 3 3  0 1 0 1 0 0 3      Obstetric Comments   SVD x 3      Past Gynecological History: As per HPI. No. history of STIs.    Social History:  Social History   Social History  . Marital Status: Single    Spouse Name: N/A  . Number  of Children: N/A  . Years of Education: N/A   Occupational History  . Not on file.   Social History Main Topics  . Smoking status: Former Research scientist (life sciences)  . Smokeless tobacco: Never Used  . Alcohol Use: Yes     Comment: rare  . Drug Use: No  . Sexual Activity: Yes    Birth Control/ Protection: Condom, Surgical   Other Topics Concern  . Not on file   Social History Narrative    Family History:  Family History  Problem Relation Age of Onset  . Cancer Father   . Hypertension Maternal Aunt   . Cancer Paternal Grandmother    She denies any female cancers, bleeding or blood clotting disorders.    Medications: PRN motrin  Allergies Mushroom extract complex and Penicillins   Physical Exam:  BP 109/78 mmHg  Pulse 81  Wt 214 lb 11.2 oz (97.387 kg)  LMP 08/18/2015 Body mass index is 38.04 kg/(m^2). General appearance: Well nourished, well developed female in no acute distress.  Neck:  Supple, normal appearance, and no thyromegaly  Cardiovascular: normal s1 and s2  No murmurs, rubs or gallops. Respiratory:  Clear to auscultation bilateral. Normal respiratory effort Abdomen: obese, positive bowel sounds and no masses,  hernias; diffusely non tender to palpation, non distended Neuro/Psych:  Normal mood and affect.  Skin:  Warm and dry.  Lymphatic:  No inguinal lymphadenopathy.   Pelvic exam: is limited by body habitus EGBUS: within normal limits Vagina: within normal limits and with no blood in the vault, Cervix:  normal Uterus:  Small, mobile, nttp and approximately 8 week sized Adnexa:  Nttp, no masses Rectovaginal: deferred  See procedure note for embx  Laboratory: as per hpi  Radiology: as per hpi  Assessment: pelvic pain and occasional AUB  Plan:  *PP: d/w pt that s/s are likely not related to GYN origin. ppBTL note didn't note any endo. Recommended to patient that she establish care at HD/PCP given medical history.  *AUB: pt states that happes 3-4x/yr where she'll  skip a month. ES thin so concern for missing a period occasional for only a month is low. embx obtained due to comorbidities and pap updated.  RTC PRN  Durene Romans MD [facility]ending [facility] [employer] [facility])

## 2015-09-30 LAB — CYTOLOGY - PAP

## 2015-09-30 NOTE — Procedures (Signed)
Endometrial Biopsy Procedure Note  Pre-operative Diagnosis: Abnormal uterine bleeding. Hypertension. BMI 38  Post-operative Diagnosis: same   Procedure Details  Urine pregnancy test was not done.  The risks (including infection, bleeding, pain, and uterine perforation) and benefits of the procedure were explained to the patient and Written informed consent was obtained.   The patient was placed in the dorsal lithotomy position.  Bimanual exam showed the uterus to be in the anteroflexed position.  A Graves' speculum inserted in the vagina, and the cervix prepped with povidone iodine.  Endocervical curettage with a Kevorkian curette was not performed.  A sharp tenaculum was applied to the anterior lip of the cervix for stabilization.  A Pipelle endometrial aspirator was used to sample the endometrium and sounded to 8.5cm.  Sample was sent for pathologic examination with minimal-moderate amount obtained after two passes  Condition: Stable  Complications: None  Plan:  The patient was advised to call for any fever or for prolonged or severe pain or bleeding. She was advised to use OTC ibuprofen as needed for mild to moderate pain. She was advised to avoid vaginal intercourse for 48 hours or until the bleeding has completely stopped.  Durene Romans MD [facility]ending [facility] [employer] [facility])

## 2015-10-04 ENCOUNTER — Encounter: Payer: Self-pay | Admitting: General Practice

## 2016-03-19 ENCOUNTER — Emergency Department (HOSPITAL_COMMUNITY): Payer: No Typology Code available for payment source

## 2016-03-19 ENCOUNTER — Emergency Department (HOSPITAL_COMMUNITY)
Admission: EM | Admit: 2016-03-19 | Discharge: 2016-03-19 | Disposition: A | Discharge status: Home / Self Care | Payer: No Typology Code available for payment source | Attending: Emergency Medicine | Admitting: Emergency Medicine

## 2016-03-19 ENCOUNTER — Encounter (HOSPITAL_COMMUNITY): Payer: Self-pay

## 2016-03-19 DIAGNOSIS — Y929 Unspecified place or not applicable: Secondary | ICD-10-CM | POA: Diagnosis not present

## 2016-03-19 DIAGNOSIS — Z87891 Personal history of nicotine dependence: Secondary | ICD-10-CM | POA: Diagnosis not present

## 2016-03-19 DIAGNOSIS — Y999 Unspecified external cause status: Secondary | ICD-10-CM | POA: Diagnosis not present

## 2016-03-19 DIAGNOSIS — S0031XA Abrasion of nose, initial encounter: Secondary | ICD-10-CM | POA: Diagnosis not present

## 2016-03-19 DIAGNOSIS — G43909 Migraine, unspecified, not intractable, without status migrainosus: Secondary | ICD-10-CM | POA: Insufficient documentation

## 2016-03-19 DIAGNOSIS — Y9389 Activity, other specified: Secondary | ICD-10-CM | POA: Insufficient documentation

## 2016-03-19 DIAGNOSIS — Z7982 Long term (current) use of aspirin: Secondary | ICD-10-CM | POA: Diagnosis not present

## 2016-03-19 DIAGNOSIS — Z79899 Other long term (current) drug therapy: Secondary | ICD-10-CM | POA: Diagnosis not present

## 2016-03-19 DIAGNOSIS — I10 Essential (primary) hypertension: Secondary | ICD-10-CM | POA: Diagnosis not present

## 2016-03-19 DIAGNOSIS — J45909 Unspecified asthma, uncomplicated: Secondary | ICD-10-CM | POA: Diagnosis not present

## 2016-03-19 DIAGNOSIS — S0992XA Unspecified injury of nose, initial encounter: Secondary | ICD-10-CM | POA: Diagnosis present

## 2016-03-19 MED ORDER — NAPROXEN 500 MG PO TABS: 500.0000 mg | ORAL_TABLET | Freq: Two times a day (BID) | ORAL | 0 refills | Status: DC

## 2016-03-19 MED ORDER — CYCLOBENZAPRINE HCL 10 MG PO TABS
5.0000 mg | ORAL_TABLET | Freq: Once | ORAL | Status: AC
  Administered 2016-03-19: 5 mg via ORAL
  Filled 2016-03-19: qty 1

## 2016-03-19 MED ORDER — NAPROXEN 250 MG PO TABS
500.0000 mg | ORAL_TABLET | Freq: Once | ORAL | Status: AC
  Administered 2016-03-19: 500 mg via ORAL
  Filled 2016-03-19: qty 2

## 2016-03-19 MED ORDER — TETRACAINE HCL 0.5 % OP SOLN
2.0000 [drp] | Freq: Once | OPHTHALMIC | Status: AC
  Administered 2016-03-19: 2 [drp] via OPHTHALMIC
  Filled 2016-03-19: qty 2

## 2016-03-19 MED ORDER — HYDROCODONE-ACETAMINOPHEN 5-325 MG PO TABS
1.0000 | ORAL_TABLET | Freq: Once | ORAL | Status: AC
  Administered 2016-03-19: 1 via ORAL
  Filled 2016-03-19: qty 1

## 2016-03-19 MED ORDER — FLUORESCEIN SODIUM 1 MG OP STRP
1.0000 | ORAL_STRIP | Freq: Once | OPHTHALMIC | Status: AC
  Administered 2016-03-19: 1 via OPHTHALMIC
  Filled 2016-03-19: qty 1

## 2016-03-19 MED ORDER — CYCLOBENZAPRINE HCL 5 MG PO TABS: 5.0000 mg | ORAL_TABLET | Freq: Three times a day (TID) | ORAL | 0 refills | Status: DC | PRN
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
## 2016-03-19 NOTE — Discharge Instructions (Signed)
Read the information below.  Your imaging is re-assuring. I have prescribed naprosyn and flexeril for symptomatic relief. While taking naprosyn do not take other NSAIDs (ibuprofen, aleve, or motrin). Flexeril can make you drowsy, do not drive after taking. Ice affected areas for 20 minute increments.  You can try warm showers or baths to soothe aching muscles.  Use the prescribed medication as directed.  Please discuss all new medications with your pharmacist.  If symptoms persist follow up with a primary provider, I have provided the contact information above.  If you develop changes in vision, unable to move eye, or purulent discharge from eye please follow up with eye doctor, contact information provided.   You may return to the Emergency Department at any time for worsening condition or any new symptoms that concern you.

## 2016-09-04 ENCOUNTER — Emergency Department (HOSPITAL_COMMUNITY)
Admission: EM | Admit: 2016-09-04 | Discharge: 2016-09-04 | Disposition: A | Discharge status: Home / Self Care | Payer: No Typology Code available for payment source | Attending: Emergency Medicine | Admitting: Emergency Medicine

## 2016-09-04 ENCOUNTER — Encounter (HOSPITAL_COMMUNITY): Payer: Self-pay | Admitting: Emergency Medicine

## 2016-09-04 ENCOUNTER — Emergency Department (HOSPITAL_COMMUNITY)
Admission: EM | Admit: 2016-09-04 | Discharge: 2016-09-04 | Disposition: A | Discharge status: Home / Self Care | Payer: Self-pay | Attending: Emergency Medicine | Admitting: Emergency Medicine

## 2016-09-04 DIAGNOSIS — N921 Excessive and frequent menstruation with irregular cycle: Secondary | ICD-10-CM

## 2016-09-04 DIAGNOSIS — Z79899 Other long term (current) drug therapy: Secondary | ICD-10-CM | POA: Insufficient documentation

## 2016-09-04 DIAGNOSIS — K0889 Other specified disorders of teeth and supporting structures: Secondary | ICD-10-CM | POA: Insufficient documentation

## 2016-09-04 DIAGNOSIS — Z5321 Procedure and treatment not carried out due to patient leaving prior to being seen by health care provider: Secondary | ICD-10-CM | POA: Diagnosis not present

## 2016-09-04 DIAGNOSIS — N939 Abnormal uterine and vaginal bleeding, unspecified: Secondary | ICD-10-CM | POA: Insufficient documentation

## 2016-09-04 DIAGNOSIS — K047 Periapical abscess without sinus: Secondary | ICD-10-CM | POA: Insufficient documentation

## 2016-09-04 DIAGNOSIS — R1030 Lower abdominal pain, unspecified: Secondary | ICD-10-CM | POA: Diagnosis not present

## 2016-09-04 DIAGNOSIS — J45909 Unspecified asthma, uncomplicated: Secondary | ICD-10-CM | POA: Insufficient documentation

## 2016-09-04 DIAGNOSIS — Z87891 Personal history of nicotine dependence: Secondary | ICD-10-CM | POA: Insufficient documentation

## 2016-09-04 DIAGNOSIS — I10 Essential (primary) hypertension: Secondary | ICD-10-CM | POA: Insufficient documentation

## 2016-09-04 DIAGNOSIS — N920 Excessive and frequent menstruation with regular cycle: Secondary | ICD-10-CM | POA: Insufficient documentation

## 2016-09-04 LAB — URINALYSIS, ROUTINE W REFLEX MICROSCOPIC
Bilirubin Urine: NEGATIVE
Glucose, UA: NEGATIVE mg/dL
HGB URINE DIPSTICK: NEGATIVE
Ketones, ur: NEGATIVE mg/dL
Leukocytes, UA: NEGATIVE
Nitrite: NEGATIVE
PH: 5 (ref 5.0–8.0)
Protein, ur: NEGATIVE mg/dL
Specific Gravity, Urine: 1.039 — ABNORMAL HIGH (ref 1.005–1.030)

## 2016-09-04 LAB — COMPREHENSIVE METABOLIC PANEL
ALBUMIN: 3.9 g/dL (ref 3.5–5.0)
ALK PHOS: 41 U/L (ref 38–126)
ALT: 18 U/L (ref 14–54)
AST: 22 U/L (ref 15–41)
Anion gap: 7 (ref 5–15)
BUN: 7 mg/dL (ref 6–20)
CALCIUM: 8.8 mg/dL — AB (ref 8.9–10.3)
CHLORIDE: 108 mmol/L (ref 101–111)
CO2: 23 mmol/L (ref 22–32)
CREATININE: 0.65 mg/dL (ref 0.44–1.00)
GFR calc Af Amer: >60 mL/min (ref >60)
GFR calc non Af Amer: >60 mL/min (ref >60)
GLUCOSE: 94 mg/dL (ref 65–99)
Potassium: 3.8 mmol/L (ref 3.5–5.1)
SODIUM: 138 mmol/L (ref 135–145)
Total Bilirubin: 0.3 mg/dL (ref 0.3–1.2)
Total Protein: 6.8 g/dL (ref 6.5–8.1)

## 2016-09-04 LAB — I-STAT CHEM 8, ED
BUN: 7 mg/dL (ref 6–20)
Calcium, Ion: 1.19 mmol/L (ref 1.15–1.40)
Chloride: 107 mmol/L (ref 101–111)
Creatinine, Ser: 0.7 mg/dL (ref 0.44–1.00)
Glucose, Bld: 78 mg/dL (ref 65–99)
HEMATOCRIT: 35 % — AB (ref 36.0–46.0)
Hemoglobin: 11.9 g/dL — ABNORMAL LOW (ref 12.0–15.0)
Potassium: 4.1 mmol/L (ref 3.5–5.1)
SODIUM: 142 mmol/L (ref 135–145)
TCO2: 24 mmol/L (ref 0–100)

## 2016-09-04 LAB — I-STAT BETA HCG BLOOD, ED (MC, WL, AP ONLY): I-stat hCG, quantitative: <5 m[IU]/mL (ref <5)

## 2016-09-04 LAB — CBC
HCT: 34.6 % — ABNORMAL LOW (ref 36.0–46.0)
Hemoglobin: 11 g/dL — ABNORMAL LOW (ref 12.0–15.0)
MCH: 27.5 pg (ref 26.0–34.0)
MCHC: 31.8 g/dL (ref 30.0–36.0)
MCV: 86.5 fL (ref 78.0–100.0)
PLATELETS: 429 10*3/uL — AB (ref 150–400)
RBC: 4 MIL/uL (ref 3.87–5.11)
RDW: 14 % (ref 11.5–15.5)
WBC: 9.1 10*3/uL (ref 4.0–10.5)

## 2016-09-04 LAB — LIPASE, BLOOD: Lipase: 22 U/L (ref 11–51)

## 2016-09-04 MED ORDER — ERYTHROMYCIN BASE 250 MG PO TABS
250.0000 mg | ORAL_TABLET | Freq: Four times a day (QID) | ORAL | Status: DC
  Administered 2016-09-04: 250 mg via ORAL
  Filled 2016-09-04: qty 1

## 2016-09-04 MED ORDER — ERYTHROMYCIN BASE 250 MG PO TABS: 250.0000 mg | ORAL_TABLET | Freq: Four times a day (QID) | ORAL | 0 refills | Status: DC

## 2016-09-04 NOTE — ED Provider Notes (Signed)
[place] DEPT Provider Note   CSN: [id number] Arrival date & time: 09/04/16  1319     History   Chief Complaint Chief Complaint  Patient presents with  . Abscess  . Migraine  . Abdominal Pain  . Flank Pain  . Urinary Frequency    HPI Dana Hughes is a 36 y.o. female.  She presents for evaluation of several problems.  She is mostly concerned about dental pain, right upper, from "an abscess".  It is been bothering her for several days.  She also complains of 3 days of heavy menses, with clots, and dyspareunia for a couple of months.  She has been told that she has had a problem, at the site of her gallbladder removal, "the clamps shifted.".  She denies nausea, vomiting, chills, cough, shortness of breath, weakness or dizziness.  There are no other known modifying factors.  HPI  Past Medical History:  Diagnosis Date  . Anxiety   . Arthritis   . Asthma   . Depression   . Headache(784.0)    migraines  . Hypertension    no longer takes meds  . Muscle spasm   . Ovarian cyst   . Scoliosis     Patient Active Problem List   Diagnosis Date Noted  . Pelvic pain in female 09/29/2015  . Abnormal uterine bleeding 09/29/2015    Past Surgical History:  Procedure Laterality Date  . CHOLECYSTECTOMY    . TUBAL LIGATION      OB History    Gravida Para Term Preterm AB Living   4 3 3  0 1 3   SAB TAB Ectopic Multiple Live Births   1 0 0 0        Obstetric Comments   SVD x 3       Home Medications    Prior to Admission medications   Medication Sig Start Date End Date Taking? Authorizing Provider  acetaminophen (TYLENOL) 500 MG tablet Take 500-1,000 mg by mouth every 6 (six) hours as needed for moderate pain.   Yes [provider]  ibuprofen (ADVIL,MOTRIN) 200 MG tablet Take 200-400 mg by mouth every 6 (six) hours as needed for moderate pain.   Yes [provider]  naproxen sodium (ANAPROX) 220 MG tablet Take 220-440 mg by mouth 2 (two) times  daily as needed (pain).   Yes [provider]  albuterol (PROVENTIL HFA;VENTOLIN HFA) 108 (90 Base) MCG/ACT inhaler Inhale 2 puffs into the lungs every 6 (six) hours as needed for wheezing or shortness of breath. Reported on 09/29/2015    [provider]  cyclobenzaprine (FLEXERIL) 5 MG tablet Take 1 tablet (5 mg total) by mouth 3 (three) times daily as needed. Patient not taking: Reported on 09/04/2016 03/19/16   Frederica Kuster, PA-C  erythromycin (E-MYCIN) 250 MG tablet Take 1 tablet (250 mg total) by mouth every 6 (six) hours. 09/04/16   Daleen Bo, MD  ibuprofen (ADVIL,MOTRIN) 800 MG tablet Take 1 tablet (800 mg total) by mouth 3 (three) times daily. Patient not taking: Reported on 09/04/2016 08/15/15   Keitha Butte, CNM  naproxen (NAPROSYN) 500 MG tablet Take 1 tablet (500 mg total) by mouth 2 (two) times daily. Patient not taking: Reported on 09/04/2016 03/19/16   Frederica Kuster, PA-C    Family History Family History  Problem Relation Age of Onset  . Cancer Father   . Hypertension Maternal Aunt   . Cancer Paternal Grandmother     Social History Social History  Substance Use Topics  . Smoking status: Former Research scientist (life sciences)  . Smokeless tobacco: Never Used  . Alcohol use Yes     Comment: rare     Allergies   Mushroom extract complex and Penicillins   Review of Systems Review of Systems  All other systems reviewed and are negative.    Physical Exam Updated Vital Signs BP (!) 137/99 (BP Location: Left Arm)   Pulse 71   Temp 98.5 F (36.9 C) (Oral)   Resp 18   Ht 5' 2.5" (1.588 m)   Wt 219 lb 6 oz (99.5 kg)   SpO2 100%   BMI 39.48 kg/m   Physical Exam  Constitutional: She is oriented to person, place, and time. She appears well-developed.  Overweight  HENT:  Head: Normocephalic and atraumatic.  Mouth with several teeth, which have large caries, eroded to the base.  Right upper gumline with 2 teeth, with moderate decay, and mild surrounding  erythema.  No visible or palpable abscess.  No trismus.  Eyes: Conjunctivae and EOM are normal. Pupils are equal, round, and reactive to light.  Neck: Normal range of motion and phonation normal. Neck supple.  Cardiovascular: Normal rate and regular rhythm.   Pulmonary/Chest: Effort normal and breath sounds normal. She exhibits no tenderness.  Genitourinary:  Genitourinary Comments: No costovertebral angle tenderness, with percussion.  Musculoskeletal: Normal range of motion.  Neurological: She is alert and oriented to person, place, and time. She exhibits normal muscle tone.  Skin: Skin is warm and dry.  Psychiatric: She has a normal mood and affect. Her behavior is normal. Judgment and thought content normal.  Nursing note and vitals reviewed.    ED Treatments / Results  Labs (all labs ordered are listed, but only abnormal results are displayed) Labs Reviewed  URINALYSIS, ROUTINE W REFLEX MICROSCOPIC - Abnormal; Notable for the following:       Result Value   Specific Gravity, Urine 1.039 (*)    All other components within normal limits  I-STAT CHEM 8, ED - Abnormal; Notable for the following:    Hemoglobin 11.9 (*)    HCT 35.0 (*)    All other components within normal limits  I-STAT BETA HCG BLOOD, ED ([facility], [facility], [facility] ONLY)    EKG  EKG Interpretation None       Radiology No results found.  Procedures Procedures (including critical care time)  Medications Ordered in ED Medications - No data to display   Initial Impression / Assessment and Plan / ED Course  I have reviewed the triage vital signs and the nursing notes.  Pertinent labs & imaging results that were available during my care of the patient were reviewed by me and considered in my medical decision making (see chart for details).     Medications - No data to display  Patient Vitals for the past 24 hrs:  BP Temp Temp src Pulse Resp SpO2 Height Weight  09/04/16 1649 (!) 137/99 - - 71 18 100 % - -    09/04/16 1342 - 98.5 F (36.9 C) Oral 77 18 100 % 5' 2.5" (1.588 m) 219 lb 6 oz (99.5 kg)    At discharge- reevaluation with update and discussion. After initial assessment and treatment, an updated evaluation reveals she remains comfortable.  Findings discussed with the patient and all questions were answered. Shakyra Mattera L    Final Clinical Impressions(s) / ED Diagnoses   Final diagnoses:  Pain, dental  Dental infection  Menorrhagia with irregular cycle  Dental pain secondary to multiple caries.  Doubt intraoral abscess, or airway obstruction.  Nonspecific menorrhagia, and dyspareunia.  Patient is not pregnant.  No indication for further evaluation at this time.  Nursing Notes Reviewed/ Care Coordinated Applicable Imaging Reviewed Interpretation of Laboratory Data incorporated into ED treatment  The patient appears reasonably screened and/or stabilized for discharge and I doubt any other medical condition or other [facility] requiring further screening, evaluation, or treatment in the ED at this time prior to discharge.  Plan: Home Medications-ibuprofen or Tylenol for pain; Home Treatments-heat to affected area; return here if the recommended treatment, does not improve the symptoms; Recommended follow up-gynecology as needed.  Follow-up with dentist of choice as soon as possible for further treatment.    New Prescriptions Discharge Medication List as of 09/04/2016  4:48 PM    START taking these medications   Details  erythromycin (E-MYCIN) 250 MG tablet Take 1 tablet (250 mg total) by mouth every 6 (six) hours., Starting Tue 09/04/2016, Print         Daleen Bo, MD 09/04/16 2045

## 2016-09-04 NOTE — ED Notes (Signed)
PT DISCHARGED. INSTRUCTIONS AND PRESCRIPTION GIVEN. AAOX4. PT IN NO APPARENT DISTRESS WITH MILD PAIN. THE OPPORTUNITY TO ASK QUESTIONS WAS PROVIDED. 

## 2016-09-04 NOTE — Discharge Instructions (Signed)
Take ibuprofen, or acetaminophen, for pain.  Follow-up with a dentist as soon as possible for further treatment of the dental discomfort and extractions, if needed.

## 2016-09-04 NOTE — ED Triage Notes (Signed)
Pt sts lower abd pain and heavy vaginal bleeding; pt sts left toothache

## 2016-09-04 NOTE — ED Triage Notes (Addendum)
Patient c/o dental abscess right lower jaw that started yesterday. Patient also c/o intermittent migraines and states she vomited yesterday.  Patient was at [facility] ED and left due to long wait times.  Patient also reports that she is experience left lower abdominal pain and left flank and having urinary frequency.

## 2016-09-05 ENCOUNTER — Telehealth: Payer: Self-pay | Admitting: Surgery

## 2016-09-05 NOTE — Telephone Encounter (Signed)
ED CM received call from patient concerning Referring Dentist Dr Mancel Parsons requesting an ED Referral . ED referral Printed and faxed to [phone]. received fax confirmation.

## 2016-09-05 NOTE — Care Management (Signed)
ED Referral Summary with Disclaimer  Dana Hughes, Dana Hughes DOB [birth year] MRN [id number] [facility] Department Date of Visit 09/04/2016  [name], [name]  was seen in the ED and was advised to follow up with you as necessary. It is the sole responsibility of the patient to arrange for an appointment with you or your practice. This notification in no way is meant to establish a doctor-patient relationship

## 2016-09-08 ENCOUNTER — Encounter (HOSPITAL_COMMUNITY): Payer: Self-pay | Admitting: Emergency Medicine

## 2016-09-08 ENCOUNTER — Ambulatory Visit (HOSPITAL_COMMUNITY)
Admission: EM | Admit: 2016-09-08 | Discharge: 2016-09-08 | Disposition: A | Discharge status: Home / Self Care | Payer: Self-pay | Attending: Internal Medicine | Admitting: Internal Medicine

## 2016-09-08 ENCOUNTER — Ambulatory Visit (HOSPITAL_COMMUNITY)
Admission: EM | Admit: 2016-09-08 | Discharge: 2016-09-08 | Discharge status: Absent without leave | Payer: No Typology Code available for payment source

## 2016-09-08 DIAGNOSIS — K0889 Other specified disorders of teeth and supporting structures: Secondary | ICD-10-CM

## 2016-09-08 MED ORDER — CLINDAMYCIN HCL 150 MG PO CAPS: 150.0000 mg | ORAL_CAPSULE | Freq: Four times a day (QID) | ORAL | 0 refills | Status: DC

## 2016-09-08 NOTE — Discharge Instructions (Signed)
I have switched her antibiotic to a cheaper antibiotic, and included a coupon. Follow up with your dental surgeon and keep your appointment as this will be the only cure for your pain. you may have over-the-counter Tylenol or ibuprofen for pain management.

## 2016-09-08 NOTE — ED Provider Notes (Signed)
CSN: [id number]     Arrival date & time 09/08/16  1452 History   None    Chief Complaint  Patient presents with  . Dental Pain   (Consider location/radiation/quality/duration/timing/severity/associated sxs/prior Treatment) 36 year old female presents to clinic for evaluation of dental pain. She was seen on 09/05/2016 at the [facility] emergency room, and started on erythromycin, states she did not get the antibiotic filled as it cost too much money. She has a appointment scheduled this coming week with an oral surgeon for further evaluation and management of her pain.   The history is provided by the patient.  Dental Pain    Past Medical History:  Diagnosis Date  . Anxiety   . Arthritis   . Asthma   . Depression   . Headache(784.0)    migraines  . Hypertension    no longer takes meds  . Muscle spasm   . Ovarian cyst   . Scoliosis    Past Surgical History:  Procedure Laterality Date  . CHOLECYSTECTOMY    . TUBAL LIGATION     Family History  Problem Relation Age of Onset  . Cancer Father   . Hypertension Maternal Aunt   . Cancer Paternal Grandmother    Social History  Substance Use Topics  . Smoking status: Former Research scientist (life sciences)  . Smokeless tobacco: Never Used  . Alcohol use Yes     Comment: rare   OB History    Gravida Para Term Preterm AB Living   4 3 3  0 1 3   SAB TAB Ectopic Multiple Live Births   1 0 0 0        Obstetric Comments   SVD x 3     Review of Systems  Constitutional: Negative.   HENT: Positive for dental problem.   Respiratory: Negative.   Cardiovascular: Negative.   Musculoskeletal: Negative.   Skin: Negative.   Neurological: Negative.     Allergies  Mushroom extract complex and Penicillins  Home Medications   Prior to Admission medications   Medication Sig Start Date End Date Taking? Authorizing Provider  ibuprofen (ADVIL,MOTRIN) 800 MG tablet Take 1 tablet (800 mg total) by mouth 3 (three) times daily. 08/15/15  Yes Keitha Butte,  CNM  acetaminophen (TYLENOL) 500 MG tablet Take 500-1,000 mg by mouth every 6 (six) hours as needed for moderate pain.    [provider]  albuterol (PROVENTIL HFA;VENTOLIN HFA) 108 (90 Base) MCG/ACT inhaler Inhale 2 puffs into the lungs every 6 (six) hours as needed for wheezing or shortness of breath. Reported on 09/29/2015    [provider]  clindamycin (CLEOCIN) 150 MG capsule Take 1 capsule (150 mg total) by mouth every 6 (six) hours. 09/08/16   Barnet Glasgow, NP  erythromycin (E-MYCIN) 250 MG tablet Take 1 tablet (250 mg total) by mouth every 6 (six) hours. 09/04/16   Daleen Bo, MD  ibuprofen (ADVIL,MOTRIN) 200 MG tablet Take 200-400 mg by mouth every 6 (six) hours as needed for moderate pain.    [provider]  naproxen (NAPROSYN) 500 MG tablet Take 1 tablet (500 mg total) by mouth 2 (two) times daily. Patient not taking: Reported on 09/04/2016 03/19/16   Frederica Kuster, PA-C  naproxen sodium (ANAPROX) 220 MG tablet Take 220-440 mg by mouth 2 (two) times daily as needed (pain).    [provider]   Meds Ordered and Administered this Visit  Medications - No data to display  BP 115/80 (BP Location: Left Arm)  Pulse 81   Temp 98.8 F (37.1 C) (Oral)   Resp 20   LMP 07/31/2016   SpO2 100%  No data found.   Physical Exam  Constitutional: She is oriented to person, place, and time. She appears well-developed and well-nourished. No distress.  HENT:  Head: Normocephalic and atraumatic.  Right Ear: External ear normal.  Left Ear: External ear normal.  Multiple teeth show signs of decay, with several large dental carries noted on multiple upper, and lower teeth. No visible sign of abscess, no sign of trismus.  Eyes: Conjunctivae are normal.  Neck: Normal range of motion.  Neurological: She is alert and oriented to person, place, and time.  Skin: Skin is warm and dry. Capillary refill takes less than 2 seconds. No rash noted. She is not  diaphoretic. No erythema.  Psychiatric: She has a normal mood and affect. Her behavior is normal.  Nursing note and vitals reviewed.   Urgent Care Course     Procedures (including critical care time)  Labs Review Labs Reviewed - No data to display  Imaging Review No results found.    MDM   1. Pain, dental    Patient's antibiotic switched to clindamycin, may take Tylenol and ibuprofen as needed for pain, encouraged to follow up and keep appointment with dentist.    Barnet Glasgow, NP 09/08/16 1649

## 2016-09-08 NOTE — ED Triage Notes (Signed)
Pt c/o right lower dental pain onset 1 week  Reports she went to [facility] ED and was given erythromycin 250 mg but pt could not afford med  Has oral surgery on 09/10/16  A&O x4... NAD... Ambulatory

## 2016-11-12 ENCOUNTER — Emergency Department (HOSPITAL_COMMUNITY)
Admission: EM | Admit: 2016-11-12 | Discharge: 2016-11-12 | Disposition: A | Discharge status: Home / Self Care | Payer: Medicaid Other | Attending: Emergency Medicine | Admitting: Emergency Medicine

## 2016-11-12 ENCOUNTER — Encounter (HOSPITAL_COMMUNITY): Payer: Self-pay | Admitting: Emergency Medicine

## 2016-11-12 DIAGNOSIS — R1030 Lower abdominal pain, unspecified: Secondary | ICD-10-CM | POA: Insufficient documentation

## 2016-11-12 DIAGNOSIS — M25512 Pain in left shoulder: Secondary | ICD-10-CM | POA: Insufficient documentation

## 2016-11-12 DIAGNOSIS — Z5321 Procedure and treatment not carried out due to patient leaving prior to being seen by health care provider: Secondary | ICD-10-CM | POA: Insufficient documentation

## 2016-11-12 LAB — URINALYSIS, ROUTINE W REFLEX MICROSCOPIC
Bilirubin Urine: NEGATIVE
GLUCOSE, UA: NEGATIVE mg/dL
HGB URINE DIPSTICK: NEGATIVE
KETONES UR: NEGATIVE mg/dL
Leukocytes, UA: NEGATIVE
Nitrite: NEGATIVE
PH: 6 (ref 5.0–8.0)
PROTEIN: NEGATIVE mg/dL
Specific Gravity, Urine: 1.02 (ref 1.005–1.030)

## 2016-11-12 LAB — POC URINE PREG, ED: PREG TEST UR: NEGATIVE

## 2016-11-12 NOTE — ED Notes (Signed)
Pt ambulatory up to triage to ask about wait times, pt made aware that we are working to get her back as soon as possible. Pt stated she needed to step away to get her children some food but would be back soon.

## 2016-11-12 NOTE — ED Notes (Signed)
Pt called for xray, no answer, unable to locate pt

## 2016-11-12 NOTE — ED Triage Notes (Signed)
Pt. Stated, Im having kidney pain and my uterus is hurting especially when I have intercourse.  Im also having trouble with my left shoulder.

## 2016-11-12 NOTE — ED Notes (Signed)
Patient called for xray, no answer

## 2016-11-14 ENCOUNTER — Ambulatory Visit: Payer: Medicaid Other | Admitting: Obstetrics

## 2016-11-16 ENCOUNTER — Emergency Department (HOSPITAL_COMMUNITY)
Admission: EM | Admit: 2016-11-16 | Discharge: 2016-11-16 | Disposition: A | Discharge status: Home / Self Care | Payer: Medicaid Other | Attending: Emergency Medicine | Admitting: Emergency Medicine

## 2016-11-16 ENCOUNTER — Emergency Department (HOSPITAL_COMMUNITY): Payer: Medicaid Other

## 2016-11-16 ENCOUNTER — Encounter (HOSPITAL_COMMUNITY): Payer: Self-pay | Admitting: Emergency Medicine

## 2016-11-16 DIAGNOSIS — Z79899 Other long term (current) drug therapy: Secondary | ICD-10-CM | POA: Insufficient documentation

## 2016-11-16 DIAGNOSIS — R1032 Left lower quadrant pain: Secondary | ICD-10-CM

## 2016-11-16 DIAGNOSIS — I10 Essential (primary) hypertension: Secondary | ICD-10-CM | POA: Insufficient documentation

## 2016-11-16 DIAGNOSIS — J45909 Unspecified asthma, uncomplicated: Secondary | ICD-10-CM | POA: Insufficient documentation

## 2016-11-16 DIAGNOSIS — Z87891 Personal history of nicotine dependence: Secondary | ICD-10-CM | POA: Insufficient documentation

## 2016-11-16 LAB — URINALYSIS, ROUTINE W REFLEX MICROSCOPIC
BILIRUBIN URINE: NEGATIVE
GLUCOSE, UA: NEGATIVE mg/dL
HGB URINE DIPSTICK: NEGATIVE
Ketones, ur: NEGATIVE mg/dL
Leukocytes, UA: NEGATIVE
Nitrite: NEGATIVE
PH: 5 (ref 5.0–8.0)
Protein, ur: NEGATIVE mg/dL
Specific Gravity, Urine: 1.029 (ref 1.005–1.030)

## 2016-11-16 LAB — CBC
HEMATOCRIT: 36.2 % (ref 36.0–46.0)
Hemoglobin: 11.6 g/dL — ABNORMAL LOW (ref 12.0–15.0)
MCH: 27.2 pg (ref 26.0–34.0)
MCHC: 32 g/dL (ref 30.0–36.0)
MCV: 84.8 fL (ref 78.0–100.0)
PLATELETS: UNDETERMINED 10*3/uL (ref 150–400)
RBC: 4.27 MIL/uL (ref 3.87–5.11)
RDW: 14.5 % (ref 11.5–15.5)
WBC: 4.8 10*3/uL (ref 4.0–10.5)

## 2016-11-16 LAB — COMPREHENSIVE METABOLIC PANEL
ALBUMIN: 3.9 g/dL (ref 3.5–5.0)
ALT: 15 U/L (ref 14–54)
AST: 35 U/L (ref 15–41)
Alkaline Phosphatase: 45 U/L (ref 38–126)
Anion gap: 7 (ref 5–15)
BUN: 11 mg/dL (ref 6–20)
CO2: 22 mmol/L (ref 22–32)
Calcium: 9.1 mg/dL (ref 8.9–10.3)
Chloride: 110 mmol/L (ref 101–111)
Creatinine, Ser: 0.99 mg/dL (ref 0.44–1.00)
GFR calc Af Amer: >60 mL/min (ref >60)
GLUCOSE: 83 mg/dL (ref 65–99)
POTASSIUM: 4.6 mmol/L (ref 3.5–5.1)
Sodium: 139 mmol/L (ref 135–145)
Total Bilirubin: 1.2 mg/dL (ref 0.3–1.2)
Total Protein: 7.1 g/dL (ref 6.5–8.1)

## 2016-11-16 LAB — POC OCCULT BLOOD, ED: FECAL OCCULT BLD: NEGATIVE

## 2016-11-16 LAB — I-STAT BETA HCG BLOOD, ED (MC, WL, AP ONLY): I-stat hCG, quantitative: <5 m[IU]/mL (ref <5)

## 2016-11-16 LAB — LIPASE, BLOOD: LIPASE: 27 U/L (ref 11–51)

## 2016-11-16 MED ORDER — CALCIUM POLYCARBOPHIL 625 MG PO TABS: 625.0000 mg | ORAL_TABLET | Freq: Every day | ORAL | 0 refills | Status: DC

## 2016-11-16 MED ORDER — POLYETHYLENE GLYCOL 3350 17 G PO PACK: 17.0000 g | PACK | Freq: Every day | ORAL | 0 refills | Status: DC

## 2016-11-16 NOTE — ED Triage Notes (Signed)
Pt here for evaluation of abdominal pain, vaginal bleeding and pain during sex. Pt states she has been having intermittent abdominal pain since she had a tubal ligation since 2005. Pt also c/o pregnancy symptoms like cravings, fatigue, urinary frequency and nausea.

## 2016-11-16 NOTE — ED Provider Notes (Signed)
[facility] DEPT Provider Note   CSN: [id number] Arrival date & time: 11/16/16  1527     History   Chief Complaint Chief Complaint  Patient presents with  . Vaginal Bleeding  . Abdominal Pain    HPI Dana Hughes is a 36 y.o. female.  HPI   Dana Hughes is a 36 y.o. female, with a history of ovarian cysts, HTN, and tubal ligation, presenting to the ED with 3 weeks of abdominal pain. Rates it 4/10 currently, LLQ, sharp/stabbing, intermittent, but increasing in frequency and intensity, radiating into the groin. Worse with palpation. OTC medications without relief. Associated nausea and spotting for last three days. Also endorses some hard stool with scant bright red blood and urinary frequency over the last couple days. Patient endorses a baseline history of alternating constipation and loose stools. LMP July 10 and is usually regular.  Denies abnormal vaginal discharge, fever/chills, vomiting, or any other complaints.  Long history of intermittent abdominal pain stemming from tubal ligation in 2005. Current pain feels different than this and different from ovarian cysts.    Past Medical History:  Diagnosis Date  . Anxiety   . Arthritis   . Asthma   . Depression   . Headache(784.0)    migraines  . Hypertension    no longer takes meds  . Muscle spasm   . Ovarian cyst   . Scoliosis     Patient Active Problem List   Diagnosis Date Noted  . Pelvic pain in female 09/29/2015  . Abnormal uterine bleeding 09/29/2015    Past Surgical History:  Procedure Laterality Date  . CHOLECYSTECTOMY    . TUBAL LIGATION      OB History    Gravida Para Term Preterm AB Living   4 3 3  0 1 3   SAB TAB Ectopic Multiple Live Births   1 0 0 0        Obstetric Comments   SVD x 3       Home Medications    Prior to Admission medications   Medication Sig Start Date End Date Taking? Authorizing Provider  acetaminophen (TYLENOL) 500 MG tablet Take 500-1,000 mg by mouth  every 6 (six) hours as needed for moderate pain.    [provider]  albuterol (PROVENTIL HFA;VENTOLIN HFA) 108 (90 Base) MCG/ACT inhaler Inhale 2 puffs into the lungs every 6 (six) hours as needed for wheezing or shortness of breath. Reported on 09/29/2015    [provider]  clindamycin (CLEOCIN) 150 MG capsule Take 1 capsule (150 mg total) by mouth every 6 (six) hours. 09/08/16   Barnet Glasgow, NP  erythromycin (E-MYCIN) 250 MG tablet Take 1 tablet (250 mg total) by mouth every 6 (six) hours. 09/04/16   Daleen Bo, MD  ibuprofen (ADVIL,MOTRIN) 200 MG tablet Take 200-400 mg by mouth every 6 (six) hours as needed for moderate pain.    [provider]  ibuprofen (ADVIL,MOTRIN) 800 MG tablet Take 1 tablet (800 mg total) by mouth 3 (three) times daily. 08/15/15   Keitha Butte, CNM  naproxen (NAPROSYN) 500 MG tablet Take 1 tablet (500 mg total) by mouth 2 (two) times daily. Patient not taking: Reported on 09/04/2016 03/19/16   Frederica Kuster, PA-C  naproxen sodium (ANAPROX) 220 MG tablet Take 220-440 mg by mouth 2 (two) times daily as needed (pain).    [provider]  polycarbophil (FIBERCON) 625 MG tablet Take 1 tablet (625 mg total) by mouth daily. 11/16/16  Nima Bamburg C, PA-C  polyethylene glycol (MIRALAX) packet Take 17 g by mouth daily. 11/16/16   Linsey Arteaga, Helane Gunther, PA-C    Family History Family History  Problem Relation Age of Onset  . Cancer Father   . Hypertension Maternal Aunt   . Cancer Paternal Grandmother     Social History Social History  Substance Use Topics  . Smoking status: Former Research scientist (life sciences)  . Smokeless tobacco: Never Used  . Alcohol use Yes     Comment: rare     Allergies   Mushroom extract complex and Penicillins   Review of Systems Review of Systems  Constitutional: Negative for appetite change, chills, diaphoresis and fever.  Respiratory: Negative for shortness of breath.   Cardiovascular: Negative for chest pain.    Gastrointestinal: Positive for abdominal pain, constipation and nausea. Negative for vomiting.  Genitourinary: Positive for frequency and vaginal bleeding. Negative for vaginal discharge.  All other systems reviewed and are negative.    Physical Exam Updated Vital Signs BP (!) 119/91   Pulse 84   Temp 97.9 F (36.6 C) (Oral)   Resp 18   Ht 5\' 3"  (1.6 m)   Wt 93.4 kg (206 lb)   LMP 10/17/2016   SpO2 98%   BMI 36.49 kg/m   Physical Exam  Constitutional: She appears well-developed and well-nourished. No distress.  HENT:  Head: Normocephalic and atraumatic.  Eyes: Conjunctivae are normal.  Neck: Neck supple.  Cardiovascular: Normal rate, regular rhythm, normal heart sounds and intact distal pulses.   Pulmonary/Chest: Effort normal and breath sounds normal. No respiratory distress.  Abdominal: Soft. There is tenderness in the left lower quadrant. There is no guarding.  Genitourinary:  Genitourinary Comments: External genitalia normal Vagina with minimal bright red blood in the vault Cervix  normal negative for cervical motion tenderness Adnexa palpated, no masses, negative for tenderness noted Bladder palpated negative for tenderness Uterus palpated no masses, negative for tenderness  Otherwise normal female genitalia.  No internal or external hemorrhoids, fissures, or lesions noted. No gross blood or stool burden. No rectal tenderness.   RN, Sharrie Rothman, served as chaperone during both exam.  Musculoskeletal: She exhibits no edema.  Lymphadenopathy:    She has no cervical adenopathy.  Neurological: She is alert.  Skin: Skin is warm and dry. She is not diaphoretic.  Psychiatric: She has a normal mood and affect. Her behavior is normal.  Nursing note and vitals reviewed.    ED Treatments / Results  Labs (all labs ordered are listed, but only abnormal results are displayed) Labs Reviewed  CBC - Abnormal; Notable for the following:       Result Value   Hemoglobin 11.6 (*)     All other components within normal limits  WET PREP, GENITAL  LIPASE, BLOOD  COMPREHENSIVE METABOLIC PANEL  URINALYSIS, ROUTINE W REFLEX MICROSCOPIC  RPR  HIV ANTIBODY (ROUTINE TESTING)  I-STAT BETA HCG BLOOD, ED ([facility], [facility], [facility] ONLY)  POC OCCULT BLOOD, ED  GC/CHLAMYDIA PROBE AMP ([place]) NOT AT [facility]    EKG  EKG Interpretation None       Radiology US Transvaginal Non-ob  Result Date: 11/16/2016 CLINICAL DATA:  Left adnexal tenderness for 6 months EXAM: TRANSABDOMINAL AND TRANSVAGINAL ULTRASOUND OF PELVIS DOPPLER ULTRASOUND OF OVARIES TECHNIQUE: Both transabdominal and transvaginal ultrasound examinations of the pelvis were performed. Transabdominal technique was performed for global imaging of the pelvis including uterus, ovaries, adnexal regions, and pelvic cul-de-sac. It was necessary to proceed with endovaginal exam following the  transabdominal exam to visualize the endometrium. Color and duplex Doppler ultrasound was utilized to evaluate blood flow to the ovaries. COMPARISON:  CT 10/16/2015 FINDINGS: Uterus Measurements: 9.7 x 4.8 x 5.7 cm. No fibroids or other mass visualized. Endometrium Thickness: 11.9 mm. No focal abnormality visualized. Minimal fluid in the fundus. Right ovary Measurements: 3.5 x 2.1 x 1.6 cm. Normal appearance/no adnexal mass. Left ovary Measurements: 3 x 2.7 x 2.5 cm. Normal appearance/no adnexal mass. Pulsed Doppler evaluation of both ovaries demonstrates normal low-resistance arterial and venous waveforms. Other findings Small free fluid IMPRESSION: Negative for ovarian torsion or mass. Small free fluid in the pelvis. Electronically Signed   By: Donavan Foil M.D.   On: 11/16/2016 21:28   US Pelvis Complete  Result Date: 11/16/2016 CLINICAL DATA:  Left adnexal tenderness for 6 months EXAM: TRANSABDOMINAL AND TRANSVAGINAL ULTRASOUND OF PELVIS DOPPLER ULTRASOUND OF OVARIES TECHNIQUE: Both transabdominal and transvaginal ultrasound examinations of the pelvis  were performed. Transabdominal technique was performed for global imaging of the pelvis including uterus, ovaries, adnexal regions, and pelvic cul-de-sac. It was necessary to proceed with endovaginal exam following the transabdominal exam to visualize the endometrium. Color and duplex Doppler ultrasound was utilized to evaluate blood flow to the ovaries. COMPARISON:  CT 10/16/2015 FINDINGS: Uterus Measurements: 9.7 x 4.8 x 5.7 cm. No fibroids or other mass visualized. Endometrium Thickness: 11.9 mm. No focal abnormality visualized. Minimal fluid in the fundus. Right ovary Measurements: 3.5 x 2.1 x 1.6 cm. Normal appearance/no adnexal mass. Left ovary Measurements: 3 x 2.7 x 2.5 cm. Normal appearance/no adnexal mass. Pulsed Doppler evaluation of both ovaries demonstrates normal low-resistance arterial and venous waveforms. Other findings Small free fluid IMPRESSION: Negative for ovarian torsion or mass. Small free fluid in the pelvis. Electronically Signed   By: Donavan Foil M.D.   On: 11/16/2016 21:28   [place] Art/ven Flow Abd Pelv Doppler  Result Date: 11/16/2016 CLINICAL DATA:  Left adnexal tenderness for 6 months EXAM: TRANSABDOMINAL AND TRANSVAGINAL ULTRASOUND OF PELVIS DOPPLER ULTRASOUND OF OVARIES TECHNIQUE: Both transabdominal and transvaginal ultrasound examinations of the pelvis were performed. Transabdominal technique was performed for global imaging of the pelvis including uterus, ovaries, adnexal regions, and pelvic cul-de-sac. It was necessary to proceed with endovaginal exam following the transabdominal exam to visualize the endometrium. Color and duplex Doppler ultrasound was utilized to evaluate blood flow to the ovaries. COMPARISON:  CT 10/16/2015 FINDINGS: Uterus Measurements: 9.7 x 4.8 x 5.7 cm. No fibroids or other mass visualized. Endometrium Thickness: 11.9 mm. No focal abnormality visualized. Minimal fluid in the fundus. Right ovary Measurements: 3.5 x 2.1 x 1.6 cm. Normal appearance/no  adnexal mass. Left ovary Measurements: 3 x 2.7 x 2.5 cm. Normal appearance/no adnexal mass. Pulsed Doppler evaluation of both ovaries demonstrates normal low-resistance arterial and venous waveforms. Other findings Small free fluid IMPRESSION: Negative for ovarian torsion or mass. Small free fluid in the pelvis. Electronically Signed   By: Donavan Foil M.D.   On: 11/16/2016 21:28    Procedures Pelvic exam Date/Time: 11/16/2016 6:40 PM Performed by: Lorayne Bender Authorized by: Arlean Hopping C  Consent: Verbal consent obtained. Risks and benefits: risks, benefits and alternatives were discussed Consent given by: patient Patient understanding: patient states understanding of the procedure being performed Patient consent: the patient's understanding of the procedure matches consent given Procedure consent: procedure consent matches procedure scheduled Patient identity confirmed: verbally with patient Local anesthesia used: no  Anesthesia: Local anesthesia used: no  Sedation: Patient sedated: no Patient  tolerance: Patient tolerated the procedure well with no immediate complications    (including critical care time)  Medications Ordered in ED Medications - No data to display   Initial Impression / Assessment and Plan / ED Course  I have reviewed the triage vital signs and the nursing notes.  Pertinent labs & imaging results that were available during my care of the patient were reviewed by me and considered in my medical decision making (see chart for details).     Patient presents with intermittent left lower quadrant pain that she states is similar to pain she has had in the past. No acute abnormalities on ultrasound. Patient states she has to catch the bus and has to leave before CT abd was performed. Shared decision making was used. Options were to return in the morning for reevaluation and possible CT depending on that provider's choice vs symptomatic care for a few days and return  as needed. Serial abdominal exams were unimpressive. Patient's discomfort resolved shortly after initial evaluation and did not recur. PCP follow-up. The patient was given instructions for home care as well as return precautions. Patient voices understanding of these instructions, accepts the plan, and is comfortable with discharge.    Final Clinical Impressions(s) / ED Diagnoses   Final diagnoses:  Left lower quadrant pain    New Prescriptions Discharge Medication List as of 11/16/2016  9:48 PM    START taking these medications   Details  polycarbophil (FIBERCON) 625 MG tablet Take 1 tablet (625 mg total) by mouth daily., Starting Fri 11/16/2016, Print    polyethylene glycol (MIRALAX) packet Take 17 g by mouth daily., Starting Fri 11/16/2016, Print         Skyla Champagne, [place] C, PA-C 11/17/16 0132    Margette Fast, MD 11/17/16 1343

## 2016-11-16 NOTE — Discharge Instructions (Signed)
Lab work and ultrasound results were encouraging today. As discussed, we decided that you have to option to return tomorrow for continued evaluation and a possible CT scan, but this is dependant on the decision of the provider caring for you at that time. Alternatively, you may try the symptomatic care below and return in a few days should symptoms fail to improve. Regardless, please follow-up with the [facility] as well as the women's clinic.  For your constipation, please follow the instructions below:  Hydration: You should start drinking at least ten, 8 oz glasses of water a day to help relieve your constipation. Baseline hydration should be at least eight, 8 oz glasses of water a day. This is a daily amount that you should be drinking, even without your current issue. Proper hydration not only helps prevent constipation, it is essential for any of the following treatments to be effective.  Fiber: Begin taking the fiber supplement daily. You should also increase the fiber in your diet.  MiraLAX: You may begin taking MiraLAX daily until you are having at least 1 soft bowel movement a day.  Follow-up: Follow-up with your primary care provider as soon as possible for continued management of this issue.  Return: Return to the ED should any symptoms worsen.

## 2016-11-16 NOTE — Medical Student Note (Signed)
[place] DEPT Provider Student Note For educational purposes for Medical, PA and NP students only and not part of the legal medical record.   CSN: [id number] Arrival date & time: 11/16/16  1527     History   Chief Complaint Chief Complaint  Patient presents with  . Vaginal Bleeding  . Abdominal Pain    HPI Dana Hughes is a 36 y.o. female.  HPI    Patient presents with a 3 week history of abdominal pain and left shoulder pain. She states that she has had multiple issues with abdominal pain since her Tubal Ligation in 2005. She notes that the pain that began 3 weeks ago feels different. She notes she has also had a ruptured ovarian cyst last year and this also feels different in nature to her current pain. She states that her pain is located mostly in her left lower quadrant it is fairly constant. She states that touch aggravates her. She has tried OTC medications with no relief. She states that the pain radiates partially into her groin. She notes that she has had spotting for the past 3 days but that that has subsided since earlier. She notes that her periods are irregular and that her LMP was on July 10th. She notes that it was very heavy in nature. She does not have a PCP or OBGYN provider and has been evaluated by ED providers multiple times for similar GYN issues.   She also notes that she has had irregular bowel movements for a long time. She notes that her last BM was yesterday. She notes straining and bright red blood present on the outside of her stool. She admits to a history of hemorrhoids following her last pregnancy. She denies the use of any medications to relieve her constipation pain.   Past Medical History:  Diagnosis Date  . Anxiety   . Arthritis   . Asthma   . Depression   . Headache(784.0)    migraines  . Hypertension    no longer takes meds  . Muscle spasm   . Ovarian cyst   . Scoliosis     Patient Active Problem List   Diagnosis Date Noted   . Pelvic pain in female 09/29/2015  . Abnormal uterine bleeding 09/29/2015    Past Surgical History:  Procedure Laterality Date  . CHOLECYSTECTOMY    . TUBAL LIGATION      OB History    Gravida Para Term Preterm AB Living   4 3 3  0 1 3   SAB TAB Ectopic Multiple Live Births   1 0 0 0        Obstetric Comments   SVD x 3       Home Medications    Prior to Admission medications   Medication Sig Start Date End Date Taking? Authorizing Provider  acetaminophen (TYLENOL) 500 MG tablet Take 500-1,000 mg by mouth every 6 (six) hours as needed for moderate pain.    [provider]  albuterol (PROVENTIL HFA;VENTOLIN HFA) 108 (90 Base) MCG/ACT inhaler Inhale 2 puffs into the lungs every 6 (six) hours as needed for wheezing or shortness of breath. Reported on 09/29/2015    [provider]  clindamycin (CLEOCIN) 150 MG capsule Take 1 capsule (150 mg total) by mouth every 6 (six) hours. 09/08/16   Barnet Glasgow, NP  erythromycin (E-MYCIN) 250 MG tablet Take 1 tablet (250 mg total) by mouth every 6 (six) hours. 09/04/16   Daleen Bo, MD  ibuprofen (ADVIL,MOTRIN)  200 MG tablet Take 200-400 mg by mouth every 6 (six) hours as needed for moderate pain.    [provider]  ibuprofen (ADVIL,MOTRIN) 800 MG tablet Take 1 tablet (800 mg total) by mouth 3 (three) times daily. 08/15/15   Keitha Butte, CNM  naproxen (NAPROSYN) 500 MG tablet Take 1 tablet (500 mg total) by mouth 2 (two) times daily. Patient not taking: Reported on 09/04/2016 03/19/16   Frederica Kuster, PA-C  naproxen sodium (ANAPROX) 220 MG tablet Take 220-440 mg by mouth 2 (two) times daily as needed (pain).    [provider]  polycarbophil (FIBERCON) 625 MG tablet Take 1 tablet (625 mg total) by mouth daily. 11/16/16   Joy, Shawn C, PA-C  polyethylene glycol (MIRALAX) packet Take 17 g by mouth daily. 11/16/16   Joy, Helane Gunther, PA-C    Family History Family History  Problem Relation Age of Onset    . Cancer Father   . Hypertension Maternal Aunt   . Cancer Paternal Grandmother     Social History Social History  Substance Use Topics  . Smoking status: Former Research scientist (life sciences)  . Smokeless tobacco: Never Used  . Alcohol use Yes     Comment: rare     Allergies   Mushroom extract complex and Penicillins   Review of Systems Review of Systems  Constitutional: Negative for chills and fever.  Respiratory: Negative for cough and shortness of breath.   Cardiovascular: Negative for chest pain and palpitations.  Gastrointestinal: Positive for abdominal pain, blood in stool, constipation and nausea. Negative for diarrhea and vomiting.  Genitourinary: Positive for frequency and vaginal bleeding (spotting past 3 days). Negative for dysuria, hematuria, urgency and vaginal discharge.  Musculoskeletal: Positive for arthralgias (left shoulder). Negative for back pain.  Skin: Negative for color change and rash.  Neurological: Negative for seizures and syncope.  All other systems reviewed and are negative.    Physical Exam Updated Vital Signs BP 123/81   Pulse 76   Temp 98 F (36.7 C) (Oral)   Resp 16   Ht 5\' 3"  (1.6 m)   Wt 93.4 kg   LMP 10/17/2016   SpO2 100%   BMI 36.49 kg/m   Physical Exam  Constitutional: She appears well-developed and well-nourished. No distress.  HENT:  Head: Normocephalic and atraumatic.  Cardiovascular: Normal rate, regular rhythm and normal heart sounds.   No murmur heard. Pulmonary/Chest: Effort normal and breath sounds normal. No respiratory distress.  Abdominal: Soft. Bowel sounds are normal. She exhibits no distension and no mass. There is tenderness (LLQ, left groin). There is no guarding. No hernia.  Genitourinary: Rectum normal, vagina normal and uterus normal. Rectal exam shows no external hemorrhoid, no internal hemorrhoid, no fissure and no tenderness. Pelvic exam was performed with patient supine. There is no rash, tenderness or lesion on the right  labia. There is no rash, tenderness or lesion on the left labia. Uterus is not tender. Cervix exhibits no motion tenderness. Right adnexum displays no mass and no tenderness. Left adnexum displays tenderness. Left adnexum displays no mass.  Genitourinary Comments: Labia without abnormalities. Vaginal canal without lesions. Cervix and cervical os clearly visualized. Non erythematous, minimal bright red bloody discharge noted on exam from os.   Bimanual exam: Tenderness to palpation in left adnexal region. Nontender in right, uterine. No CMT.  Rectum: Normal tone, no stool in rectal vault, no hemorrhoids noted on exam.     ED Treatments / Results  Labs (all labs ordered are  listed, but only abnormal results are displayed) Labs Reviewed  CBC - Abnormal; Notable for the following:       Result Value   Hemoglobin 11.6 (*)    All other components within normal limits  WET PREP, GENITAL  LIPASE, BLOOD  COMPREHENSIVE METABOLIC PANEL  URINALYSIS, ROUTINE W REFLEX MICROSCOPIC  RPR  HIV ANTIBODY (ROUTINE TESTING)  I-STAT BETA HCG BLOOD, ED ([facility], [facility], [facility] ONLY)  POC OCCULT BLOOD, ED  GC/CHLAMYDIA PROBE AMP ([place]) NOT AT [facility]    EKG  EKG Interpretation None       Radiology US Transvaginal Non-ob  Result Date: 11/16/2016 CLINICAL DATA:  Left adnexal tenderness for 6 months EXAM: TRANSABDOMINAL AND TRANSVAGINAL ULTRASOUND OF PELVIS DOPPLER ULTRASOUND OF OVARIES TECHNIQUE: Both transabdominal and transvaginal ultrasound examinations of the pelvis were performed. Transabdominal technique was performed for global imaging of the pelvis including uterus, ovaries, adnexal regions, and pelvic cul-de-sac. It was necessary to proceed with endovaginal exam following the transabdominal exam to visualize the endometrium. Color and duplex Doppler ultrasound was utilized to evaluate blood flow to the ovaries. COMPARISON:  CT 10/16/2015 FINDINGS: Uterus Measurements: 9.7 x 4.8 x 5.7 cm. No fibroids  or other mass visualized. Endometrium Thickness: 11.9 mm. No focal abnormality visualized. Minimal fluid in the fundus. Right ovary Measurements: 3.5 x 2.1 x 1.6 cm. Normal appearance/no adnexal mass. Left ovary Measurements: 3 x 2.7 x 2.5 cm. Normal appearance/no adnexal mass. Pulsed Doppler evaluation of both ovaries demonstrates normal low-resistance arterial and venous waveforms. Other findings Small free fluid IMPRESSION: Negative for ovarian torsion or mass. Small free fluid in the pelvis. Electronically Signed   By: Donavan Foil M.D.   On: 11/16/2016 21:28   US Pelvis Complete  Result Date: 11/16/2016 CLINICAL DATA:  Left adnexal tenderness for 6 months EXAM: TRANSABDOMINAL AND TRANSVAGINAL ULTRASOUND OF PELVIS DOPPLER ULTRASOUND OF OVARIES TECHNIQUE: Both transabdominal and transvaginal ultrasound examinations of the pelvis were performed. Transabdominal technique was performed for global imaging of the pelvis including uterus, ovaries, adnexal regions, and pelvic cul-de-sac. It was necessary to proceed with endovaginal exam following the transabdominal exam to visualize the endometrium. Color and duplex Doppler ultrasound was utilized to evaluate blood flow to the ovaries. COMPARISON:  CT 10/16/2015 FINDINGS: Uterus Measurements: 9.7 x 4.8 x 5.7 cm. No fibroids or other mass visualized. Endometrium Thickness: 11.9 mm. No focal abnormality visualized. Minimal fluid in the fundus. Right ovary Measurements: 3.5 x 2.1 x 1.6 cm. Normal appearance/no adnexal mass. Left ovary Measurements: 3 x 2.7 x 2.5 cm. Normal appearance/no adnexal mass. Pulsed Doppler evaluation of both ovaries demonstrates normal low-resistance arterial and venous waveforms. Other findings Small free fluid IMPRESSION: Negative for ovarian torsion or mass. Small free fluid in the pelvis. Electronically Signed   By: Donavan Foil M.D.   On: 11/16/2016 21:28   [place] Art/ven Flow Abd Pelv Doppler  Result Date: 11/16/2016 CLINICAL DATA:   Left adnexal tenderness for 6 months EXAM: TRANSABDOMINAL AND TRANSVAGINAL ULTRASOUND OF PELVIS DOPPLER ULTRASOUND OF OVARIES TECHNIQUE: Both transabdominal and transvaginal ultrasound examinations of the pelvis were performed. Transabdominal technique was performed for global imaging of the pelvis including uterus, ovaries, adnexal regions, and pelvic cul-de-sac. It was necessary to proceed with endovaginal exam following the transabdominal exam to visualize the endometrium. Color and duplex Doppler ultrasound was utilized to evaluate blood flow to the ovaries. COMPARISON:  CT 10/16/2015 FINDINGS: Uterus Measurements: 9.7 x 4.8 x 5.7 cm. No fibroids or other mass visualized. Endometrium Thickness: 11.9  mm. No focal abnormality visualized. Minimal fluid in the fundus. Right ovary Measurements: 3.5 x 2.1 x 1.6 cm. Normal appearance/no adnexal mass. Left ovary Measurements: 3 x 2.7 x 2.5 cm. Normal appearance/no adnexal mass. Pulsed Doppler evaluation of both ovaries demonstrates normal low-resistance arterial and venous waveforms. Other findings Small free fluid IMPRESSION: Negative for ovarian torsion or mass. Small free fluid in the pelvis. Electronically Signed   By: Donavan Foil M.D.   On: 11/16/2016 21:28    Procedures Procedures (including critical care time)  Medications Ordered in ED Medications - No data to display   Initial Impression / Assessment and Plan / ED Course  I have reviewed the triage vital signs and the nursing notes.  Pertinent labs & imaging results that were available during my care of the patient were reviewed by me and considered in my medical decision making (see chart for details).     Patient with abdominal pain for past 3 weeks that she states is different from her traditional abdominal pain. She notes that she is having vaginal spotting for the past 3 days. Her lab work returned unremarkable. Her serum hCG was negative. Pelvic revealed small amount of bright red  discharge. Wet prep/GC-Chlamydia pending. Rectal performed without concerning abnormality. Transvaginal US performed revealing no masses or torsion. A small amount of free fluid was present in the pelvis. The patient states at this time that she and her family need to leave to catch a bus home. She is being discharged with Fiber supplements and Miralax for constipation relief and instructions to follow up in a few days with plans for a CT if symptoms do not improve or worsen. She states agreement with plan and appears stable for discharge.  Final Clinical Impressions(s) / ED Diagnoses   Final diagnoses:  Left lower quadrant pain    New Prescriptions Discharge Medication List as of 11/16/2016  9:48 PM    START taking these medications   Details  polycarbophil (FIBERCON) 625 MG tablet Take 1 tablet (625 mg total) by mouth daily., Starting Fri 11/16/2016, Print    polyethylene glycol (MIRALAX) packet Take 17 g by mouth daily., Starting Fri 11/16/2016, Print

## 2016-11-17 ENCOUNTER — Emergency Department (HOSPITAL_COMMUNITY)
Admission: EM | Admit: 2016-11-17 | Discharge: 2016-11-17 | Discharge status: Absent without leave | Payer: Medicaid Other

## 2016-11-17 LAB — WET PREP, GENITAL
CLUE CELLS WET PREP: NONE SEEN
Sperm: NONE SEEN
TRICH WET PREP: NONE SEEN
YEAST WET PREP: NONE SEEN

## 2016-11-17 LAB — HIV ANTIBODY (ROUTINE TESTING W REFLEX): HIV SCREEN 4TH GENERATION: NONREACTIVE

## 2016-11-17 LAB — RPR: RPR: NONREACTIVE

## 2016-11-19 LAB — GC/CHLAMYDIA PROBE AMP (~~LOC~~) NOT AT ARMC
Chlamydia: NEGATIVE
NEISSERIA GONORRHEA: NEGATIVE

## 2016-12-11 ENCOUNTER — Ambulatory Visit (HOSPITAL_COMMUNITY)
Admission: EM | Admit: 2016-12-11 | Discharge: 2016-12-11 | Disposition: A | Discharge status: Home / Self Care | Payer: Medicaid Other | Attending: Family Medicine | Admitting: Family Medicine

## 2016-12-11 ENCOUNTER — Encounter (HOSPITAL_COMMUNITY): Payer: Self-pay | Admitting: Emergency Medicine

## 2016-12-11 DIAGNOSIS — M545 Low back pain, unspecified: Secondary | ICD-10-CM

## 2016-12-11 DIAGNOSIS — G5603 Carpal tunnel syndrome, bilateral upper limbs: Secondary | ICD-10-CM

## 2016-12-11 MED ORDER — NAPROXEN 500 MG PO TABS: 500.0000 mg | ORAL_TABLET | Freq: Two times a day (BID) | ORAL | 0 refills | Status: DC

## 2016-12-11 MED ORDER — HYDROCODONE-ACETAMINOPHEN 5-325 MG PO TABS: 1.0000 | ORAL_TABLET | Freq: Four times a day (QID) | ORAL | 0 refills | Status: DC | PRN

## 2016-12-11 MED ORDER — CYCLOBENZAPRINE HCL 10 MG PO TABS: 10.0000 mg | ORAL_TABLET | Freq: Two times a day (BID) | ORAL | 0 refills | Status: DC | PRN

## 2016-12-11 NOTE — ED Triage Notes (Signed)
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

## 2016-12-12 NOTE — ED Provider Notes (Signed)
[place]   [id number] 12/11/16 Arrival Time: 3267  ASSESSMENT & PLAN:  1. Acute right-sided low back pain without sciatica   2. Carpal tunnel syndrome on both sides     Meds ordered this encounter  Medications  . naproxen (NAPROSYN) 500 MG tablet    Sig: Take 1 tablet (500 mg total) by mouth 2 (two) times daily.    Dispense:  20 tablet    Refill:  0  . cyclobenzaprine (FLEXERIL) 10 MG tablet    Sig: Take 1 tablet (10 mg total) by mouth 2 (two) times daily as needed for muscle spasms.    Dispense:  20 tablet    Refill:  0  . HYDROcodone-acetaminophen (NORCO/VICODIN) 5-325 MG tablet    Sig: Take 1 tablet by mouth every 6 (six) hours as needed for moderate pain or severe pain.    Dispense:  8 tablet    Refill:  0   Recommend orthopaedic evaluation given that most of these symptoms are long-standing with apparent frequent exacerbations. She will f/u with PCP first. May f/u here as needed. Medication sedation precautions.  [place] Controlled Substances Registry consulted for this patient. I feel the risk/benefit ratio today is favorable for proceeding with this prescription for a controlled substance.  Reviewed expectations re: course of current medical issues. Questions answered. Outlined signs and symptoms indicating need for more acute intervention. Patient verbalized understanding. After Visit Summary given.   SUBJECTIVE:  Dana Hughes is a 36 y.o. female who presents with complaint of low back discomfort for the past few days. H/O similar. No specific injury. Gradual onset. Worse with prolonged standing and walking. Slight improvement with rest. Describes baseline discomfort as dull but with sharp and transient pains. No radicular symptoms. No LE sensation changes or weakness. Ambulatory without difficulty. No urinary symptoms or hematuria. Affecting sleep now.  Also reports h/o bilateral 3/4/5 finger "numbness and pain" that comes and goes. Currently with  symptoms for approx 1 week. No injuries in the past. Used to do hair and questions relation. OTC without relief. No orthopaedic evaluation. No specific aggravating or alleviating factors reported.  ROS: As per HPI.   OBJECTIVE:  Vitals:   12/11/16 1349  BP: 134/82  Pulse: 80  Resp: 16  Temp: 98.7 F (37.1 C)  TempSrc: Oral  SpO2: 100%  Weight: 210 lb (95.3 kg)  Height: 5\' 3"  (1.6 m)     General appearance: alert; no distress HEENT: normocephalic; atraumatic Neck: FROM; subbpe Lungs: clear to auscultation bilaterally Abdomen: soft, non-tender; bowel sounds normal; no masses or organomegaly; no guarding or rebound tenderness Back: R lower paraspinal muscle tenderness; no midline tenderness; FROM of legs at hips Extremities: no cyanosis or edema; symmetrical with no gross deformities; does report mild sensation changes over bilat 3/4/5 fingers; normal grip strength; FROM of all fingers and of both wrists; no swelling or skin changes Skin: warm and dry Psychological:  alert and cooperative; normal mood and affect   Allergies  Allergen Reactions  . Mushroom Extract Complex Anaphylaxis  . Penicillins Itching    Has patient had a PCN reaction causing immediate rash, facial/tongue/throat swelling, SOB or lightheadedness with hypotension: No Has patient had a PCN reaction causing severe rash involving mucus membranes or skin necrosis: No Has patient had a PCN reaction that required hospitalization No Has patient had a PCN reaction occurring within the last 10 years: No If all of the above answers are "NO", then may proceed with Cephalosporin use.  Past Medical History:  Diagnosis Date  . Anxiety   . Arthritis   . Asthma   . Depression   . Headache(784.0)    migraines  . Hypertension    no longer takes meds  . Muscle spasm   . Ovarian cyst   . Scoliosis        Vanessa Kick, MD 12/12/16 [phone]

## 2017-01-09 ENCOUNTER — Ambulatory Visit (HOSPITAL_COMMUNITY)
Admission: EM | Admit: 2017-01-09 | Discharge: 2017-01-09 | Disposition: A | Discharge status: Home / Self Care | Payer: Self-pay | Attending: Family Medicine | Admitting: Family Medicine

## 2017-01-09 ENCOUNTER — Encounter (HOSPITAL_COMMUNITY): Payer: Self-pay | Admitting: Family Medicine

## 2017-01-09 DIAGNOSIS — F418 Other specified anxiety disorders: Secondary | ICD-10-CM

## 2017-01-09 DIAGNOSIS — G5603 Carpal tunnel syndrome, bilateral upper limbs: Secondary | ICD-10-CM

## 2017-01-09 MED ORDER — PREDNISONE 10 MG (21) PO TBPK: ORAL_TABLET | Freq: Every day | ORAL | 0 refills | Status: DC

## 2017-01-09 NOTE — ED Triage Notes (Signed)
Pt here for chronic hand pain and back pain . sts also that at work yesterday she had a stressful episode with her boss and her BP shot up. sts that she became lightheaded and almost passed out when she became upset. Worried about her BP.

## 2017-01-09 NOTE — ED Provider Notes (Signed)
[place]   [id number] 01/09/17 Arrival Time: [place] PLAN:  1. Situational anxiety   2. Bilateral carpal tunnel syndrome    Concern voiced over BP. 130/90 today. She has BP cuff at home. Will monitor.  Meds ordered this encounter  Medications  . predniSONE (STERAPRED UNI-PAK 21 TAB) 10 MG (21) TBPK tablet    Sig: Take by mouth daily. Take as directed.    Dispense:  21 tablet    Refill:  0   Given # for sports medicine. She will call for appt. Anxiety under control. Completely situational with situation at work. No treatment necessary.  Reviewed expectations re: course of current medical issues. Questions answered. Outlined signs and symptoms indicating need for more acute intervention. Patient verbalized understanding. After Visit Summary given.   SUBJECTIVE:  Dana Hughes is a 36 y.o. female who presents with complaint of situational anxiety yesterday at work. "Boss stressed me out." Feeling better now. Worried her BP may be elevated. No h/o elevated BP reported. No CP/SOB. Still having carpal tunnel symptoms. Has been unable to see ortho as she is self-pay. No worsening.  ROS: As per HPI.   OBJECTIVE:  Vitals:   01/09/17 1016  BP: 130/90  Pulse: 75  Resp: 18  Temp: 98.5 F (36.9 C)  SpO2: 100%    General appearance: alert; no distress Eyes: PERRLA; EOMI; conjunctiva normal Neck: supple Lungs: clear to auscultation bilaterally Heart: regular rate and rhythm Extremities: no cyanosis or edema; symmetrical with no gross deformities; still reports mild sensation changes over bilat 3/4/5 fingers; normal grip strength; FROM of all fingers and of both wrists Skin: warm and dry Psychological: alert and cooperative; normal mood and affect   Allergies  Allergen Reactions  . Mushroom Extract Complex Anaphylaxis  . Penicillins Itching    Has patient had a PCN reaction causing immediate rash, facial/tongue/throat swelling, SOB or  lightheadedness with hypotension: No Has patient had a PCN reaction causing severe rash involving mucus membranes or skin necrosis: No Has patient had a PCN reaction that required hospitalization No Has patient had a PCN reaction occurring within the last 10 years: No If all of the above answers are "NO", then may proceed with Cephalosporin use.     Past Medical History:  Diagnosis Date  . Anxiety   . Arthritis   . Asthma   . Depression   . Headache(784.0)    migraines  . Hypertension    no longer takes meds  . Muscle spasm   . Ovarian cyst   . Scoliosis    Social History   Social History  . Marital status: Single    Spouse name: N/A  . Number of children: N/A  . Years of education: N/A   Occupational History  . Not on file.   Social History Main Topics  . Smoking status: Former Research scientist (life sciences)  . Smokeless tobacco: Never Used  . Alcohol use Yes     Comment: rare  . Drug use: No  . Sexual activity: Yes    Birth control/ protection: Condom, Surgical   Other Topics Concern  . Not on file   Social History Narrative  . No narrative on file   Family History  Problem Relation Age of Onset  . Cancer Father   . Hypertension Maternal Aunt   . Cancer Paternal Grandmother    Past Surgical History:  Procedure Laterality Date  . CHOLECYSTECTOMY    . TUBAL LIGATION  Vanessa Kick, MD 01/09/17 [phone]

## 2017-03-21 ENCOUNTER — Other Ambulatory Visit (HOSPITAL_COMMUNITY)
Admission: RE | Admit: 2017-03-21 | Discharge: 2017-03-21 | Disposition: A | Discharge status: Home / Self Care | Payer: Medicaid Other | Source: Ambulatory Visit | Attending: Obstetrics | Admitting: Obstetrics

## 2017-03-21 ENCOUNTER — Encounter: Payer: Self-pay | Admitting: Obstetrics

## 2017-03-21 ENCOUNTER — Ambulatory Visit (INDEPENDENT_AMBULATORY_CARE_PROVIDER_SITE_OTHER): Payer: Medicaid Other | Admitting: Obstetrics

## 2017-03-21 VITALS — BP 135/91 | HR 90 | Ht 62.5 in | Wt 224.6 lb

## 2017-03-21 DIAGNOSIS — N944 Primary dysmenorrhea: Secondary | ICD-10-CM | POA: Insufficient documentation

## 2017-03-21 DIAGNOSIS — N760 Acute vaginitis: Secondary | ICD-10-CM | POA: Insufficient documentation

## 2017-03-21 DIAGNOSIS — Z01419 Encounter for gynecological examination (general) (routine) without abnormal findings: Secondary | ICD-10-CM | POA: Insufficient documentation

## 2017-03-21 DIAGNOSIS — B379 Candidiasis, unspecified: Secondary | ICD-10-CM | POA: Diagnosis not present

## 2017-03-21 DIAGNOSIS — B373 Candidiasis of vulva and vagina: Secondary | ICD-10-CM | POA: Insufficient documentation

## 2017-03-21 DIAGNOSIS — Z113 Encounter for screening for infections with a predominantly sexual mode of transmission: Secondary | ICD-10-CM | POA: Diagnosis not present

## 2017-03-21 DIAGNOSIS — Z01411 Encounter for gynecological examination (general) (routine) with abnormal findings: Secondary | ICD-10-CM | POA: Insufficient documentation

## 2017-03-21 DIAGNOSIS — Z1151 Encounter for screening for human papillomavirus (HPV): Secondary | ICD-10-CM | POA: Diagnosis not present

## 2017-03-21 DIAGNOSIS — R102 Pelvic and perineal pain: Secondary | ICD-10-CM | POA: Insufficient documentation

## 2017-03-21 DIAGNOSIS — Z309 Encounter for contraceptive management, unspecified: Secondary | ICD-10-CM | POA: Diagnosis not present

## 2017-03-21 DIAGNOSIS — B9689 Other specified bacterial agents as the cause of diseases classified elsewhere: Secondary | ICD-10-CM | POA: Diagnosis not present

## 2017-03-21 DIAGNOSIS — Z6841 Body Mass Index (BMI) 40.0 and over, adult: Secondary | ICD-10-CM | POA: Diagnosis not present

## 2017-03-21 DIAGNOSIS — R030 Elevated blood-pressure reading, without diagnosis of hypertension: Secondary | ICD-10-CM

## 2017-03-21 DIAGNOSIS — N898 Other specified noninflammatory disorders of vagina: Secondary | ICD-10-CM | POA: Insufficient documentation

## 2017-03-21 MED ORDER — TRIAMTERENE-HCTZ 37.5-25 MG PO CAPS: 1.0000 | ORAL_CAPSULE | Freq: Every morning | ORAL | 11 refills | Status: DC

## 2017-03-21 MED ORDER — IBUPROFEN 800 MG PO TABS: 800.0000 mg | ORAL_TABLET | Freq: Three times a day (TID) | ORAL | 5 refills | Status: DC | PRN

## 2017-03-21 NOTE — Progress Notes (Signed)
Patient is in the office for annual, last pap 09-29-15. Pt complains that she has been having headaches and dizziness, but has been unable to get insurance to see primary provider.

## 2017-03-21 NOTE — Progress Notes (Signed)
Subjective:        Dana Hughes is a 36 y.o. female here for a routine exam.  Current complaints: Pelvic pain since BTL 2005.  This pain is chronic and has occurred since tubal sterilization. Periods are also painful and heavy.  Personal health questionnaire:  Is patient Ashkenazi Jewish, have a family history of breast and/or ovarian cancer: yes Is there a family history of uterine cancer diagnosed at age < 8, gastrointestinal cancer, urinary tract cancer, family member who is a Field seismologist syndrome-associated carrier: no Is the patient overweight and hypertensive, family history of diabetes, personal history of gestational diabetes, preeclampsia or PCOS: no Is patient over 40, have PCOS,  family history of premature CHD under age 16, diabetes, smoke, have hypertension or peripheral artery disease:  no At any time, has a partner hit, kicked or otherwise hurt or frightened you?: no Over the past 2 weeks, have you felt down, depressed or hopeless?: no Over the past 2 weeks, have you felt little interest or pleasure in doing things?:no   Gynecologic History No LMP recorded. Contraception: tubal ligation Last Pap: 2017. Results were: normal Last mammogram: n/a. Results were: n/a  Obstetric History OB History  Gravida Para Term Preterm AB Living  4 3 3  0 1 3  SAB TAB Ectopic Multiple Live Births  1 0 0 0 3    # Outcome Date GA Lbr Len/2nd Weight Sex Delivery Anes PTL Lv  4 Term 12/27/03    F Vag-Spont   LIV  3 Term 10/16/02    M Vag-Spont   LIV  2 Term 04/10/98    M Vag-Spont   LIV  1 SAB             Obstetric Comments  SVD x 3    Past Medical History:  Diagnosis Date  . Anxiety   . Arthritis   . Asthma   . Depression   . Headache(784.0)    migraines  . Hypertension    no longer takes meds  . Muscle spasm   . Ovarian cyst   . Scoliosis     Past Surgical History:  Procedure Laterality Date  . CHOLECYSTECTOMY    . TUBAL LIGATION       Current Outpatient  Medications:  .  acetaminophen (TYLENOL) 500 MG tablet, Take 500-1,000 mg by mouth every 6 (six) hours as needed for moderate pain., Disp: , Rfl:  .  albuterol (PROVENTIL HFA;VENTOLIN HFA) 108 (90 Base) MCG/ACT inhaler, Inhale 2 puffs into the lungs every 6 (six) hours as needed for wheezing or shortness of breath. Reported on 09/29/2015, Disp: , Rfl:  .  clindamycin (CLEOCIN) 150 MG capsule, Take 1 capsule (150 mg total) by mouth every 6 (six) hours. (Patient not taking: Reported on 03/21/2017), Disp: 28 capsule, Rfl: 0 .  cyclobenzaprine (FLEXERIL) 10 MG tablet, Take 1 tablet (10 mg total) by mouth 2 (two) times daily as needed for muscle spasms. (Patient not taking: Reported on 03/21/2017), Disp: 20 tablet, Rfl: 0 .  erythromycin (E-MYCIN) 250 MG tablet, Take 1 tablet (250 mg total) by mouth every 6 (six) hours. (Patient not taking: Reported on 03/21/2017), Disp: 28 tablet, Rfl: 0 .  HYDROcodone-acetaminophen (NORCO/VICODIN) 5-325 MG tablet, Take 1 tablet by mouth every 6 (six) hours as needed for moderate pain or severe pain. (Patient not taking: Reported on 03/21/2017), Disp: 8 tablet, Rfl: 0 .  ibuprofen (ADVIL,MOTRIN) 200 MG tablet, Take 200-400 mg by mouth every 6 (six) hours as  needed for moderate pain., Disp: , Rfl:  .  ibuprofen (ADVIL,MOTRIN) 800 MG tablet, Take 1 tablet (800 mg total) by mouth 3 (three) times daily. (Patient not taking: Reported on 03/21/2017), Disp: 21 tablet, Rfl: 0 .  naproxen (NAPROSYN) 500 MG tablet, Take 1 tablet (500 mg total) by mouth 2 (two) times daily. (Patient not taking: Reported on 03/21/2017), Disp: 20 tablet, Rfl: 0 .  naproxen sodium (ANAPROX) 220 MG tablet, Take 220-440 mg by mouth 2 (two) times daily as needed (pain)., Disp: , Rfl:  .  polycarbophil (FIBERCON) 625 MG tablet, Take 1 tablet (625 mg total) by mouth daily. (Patient not taking: Reported on 03/21/2017), Disp: 30 tablet, Rfl: 0 .  polyethylene glycol (MIRALAX) packet, Take 17 g by mouth daily.  (Patient not taking: Reported on 03/21/2017), Disp: 14 each, Rfl: 0 .  predniSONE (STERAPRED UNI-PAK 21 TAB) 10 MG (21) TBPK tablet, Take by mouth daily. Take as directed. (Patient not taking: Reported on 03/21/2017), Disp: 21 tablet, Rfl: 0 Allergies  Allergen Reactions  . Mushroom Extract Complex Anaphylaxis  . Penicillins Itching    Has patient had a PCN reaction causing immediate rash, facial/tongue/throat swelling, SOB or lightheadedness with hypotension: No Has patient had a PCN reaction causing severe rash involving mucus membranes or skin necrosis: No Has patient had a PCN reaction that required hospitalization No Has patient had a PCN reaction occurring within the last 10 years: No If all of the above answers are "NO", then may proceed with Cephalosporin use.     Social History   Tobacco Use  . Smoking status: Former Research scientist (life sciences)  . Smokeless tobacco: Never Used  Substance Use Topics  . Alcohol use: Yes    Comment: rare    Family History  Problem Relation Age of Onset  . Cancer Father   . Hypertension Maternal Aunt   . Breast cancer Maternal Aunt   . Cancer Paternal Grandmother   . Breast cancer Paternal Grandmother   . Breast cancer Paternal Aunt       Review of Systems  Constitutional: negative for fatigue and weight loss Respiratory: negative for cough and wheezing Cardiovascular: negative for chest pain, fatigue and palpitations Gastrointestinal: negative for abdominal pain and change in bowel habits Musculoskeletal:negative for myalgias Neurological: negative for gait problems and tremors Behavioral/Psych: negative for abusive relationship, depression Endocrine: negative for temperature intolerance    Genitourinary:positive for pelvic pain and heavy painful periods Integument/breast: negative for breast lump, breast tenderness, nipple discharge and skin lesion(s)    Objective:       Ht 5' 2.5" (1.588 m)   Wt 224 lb 9.6 oz (101.9 kg)   BMI 40.43 kg/m   General:   alert  Skin:   no rash or abnormalities  Lungs:   clear to auscultation bilaterally  Heart:   regular rate and rhythm, S1, S2 normal, no murmur, click, rub or gallop  Breasts:   normal without suspicious masses, skin or nipple changes or axillary nodes  Abdomen:  normal findings: no organomegaly, soft, non-tender and no hernia  Pelvis:  External genitalia: normal general appearance Urinary system: urethral meatus normal and bladder without fullness, nontender Vaginal: normal without tenderness, induration or masses Cervix: normal appearance Adnexa: normal bimanual exam Uterus: anteverted and non-tender, normal size   Lab Review Urine pregnancy test Labs reviewed yes Radiologic studies reviewed yes  50% of 20 min visit spent on counseling and coordination of care.   Assessment:     1. Encounter for routine  gynecological examination with Papanicolaou smear of cervix Rx: - Cytology - PAP  2. Pelvic pain in female Rx: - ibuprofen (ADVIL,MOTRIN) 800 MG tablet; Take 1 tablet (800 mg total) by mouth every 8 (eight) hours as needed.  Dispense: 30 tablet; Refill: 5  3. Primary dysmenorrhea - Ibuprofen Rx  4. Vaginal discharge Rx:  - Cervicovaginal ancillary only  5. Class 3 severe obesity due to excess calories without serious comorbidity with body mass index (BMI) of 40.0 to 44.9 in adult [facility]) - program of caloric reduction, exercise and behavioral modification recommended  6. Elevated BP without diagnosis of hypertension Rx: - triamterene-hydrochlorothiazide (DYAZIDE) 37.5-25 MG capsule; Take 1 each (1 capsule total) by mouth every morning.  Dispense: 30 capsule; Refill: 11  7. Screen for STD (sexually transmitted disease) Rx: - HIV antibody - RPR   Plan:    Education reviewed: calcium supplements, depression evaluation, low fat, low cholesterol diet, safe sex/STD prevention, self breast exams and weight bearing exercise. Follow up in: 1 year.   No orders  of the defined types were placed in this encounter.  No orders of the defined types were placed in this encounter.

## 2017-03-25 LAB — CERVICOVAGINAL ANCILLARY ONLY
Bacterial vaginitis: POSITIVE — AB
CHLAMYDIA, DNA PROBE: NEGATIVE
Candida vaginitis: POSITIVE — AB
NEISSERIA GONORRHEA: NEGATIVE
Trichomonas: NEGATIVE

## 2017-03-26 ENCOUNTER — Other Ambulatory Visit: Payer: Self-pay | Admitting: Obstetrics

## 2017-03-26 DIAGNOSIS — B373 Candidiasis of vulva and vagina: Secondary | ICD-10-CM

## 2017-03-26 DIAGNOSIS — B9689 Other specified bacterial agents as the cause of diseases classified elsewhere: Secondary | ICD-10-CM

## 2017-03-26 DIAGNOSIS — B3731 Acute candidiasis of vulva and vagina: Secondary | ICD-10-CM

## 2017-03-26 DIAGNOSIS — N760 Acute vaginitis: Secondary | ICD-10-CM

## 2017-03-26 LAB — CYTOLOGY - PAP
Diagnosis: NEGATIVE
HPV: NOT DETECTED

## 2017-03-26 MED ORDER — FLUCONAZOLE 150 MG PO TABS: 150.0000 mg | ORAL_TABLET | Freq: Once | ORAL | 0 refills | Status: AC

## 2017-03-26 MED ORDER — SECNIDAZOLE 2 G PO PACK: 1.0000 | PACK | Freq: Once | ORAL | 2 refills | Status: AC

## 2017-04-05 ENCOUNTER — Emergency Department (HOSPITAL_COMMUNITY)
Admission: EM | Admit: 2017-04-05 | Discharge: 2017-04-05 | Disposition: A | Discharge status: Home / Self Care | Payer: Medicaid Other | Attending: Emergency Medicine | Admitting: Emergency Medicine

## 2017-04-05 ENCOUNTER — Encounter (HOSPITAL_COMMUNITY): Payer: Self-pay

## 2017-04-05 DIAGNOSIS — Z5321 Procedure and treatment not carried out due to patient leaving prior to being seen by health care provider: Secondary | ICD-10-CM | POA: Insufficient documentation

## 2017-04-05 DIAGNOSIS — R1030 Lower abdominal pain, unspecified: Secondary | ICD-10-CM | POA: Insufficient documentation

## 2017-04-05 LAB — COMPREHENSIVE METABOLIC PANEL
ALBUMIN: 3.5 g/dL (ref 3.5–5.0)
ALT: 24 U/L (ref 14–54)
ANION GAP: 7 (ref 5–15)
AST: 29 U/L (ref 15–41)
Alkaline Phosphatase: 50 U/L (ref 38–126)
BUN: 10 mg/dL (ref 6–20)
CHLORIDE: 105 mmol/L (ref 101–111)
CO2: 25 mmol/L (ref 22–32)
CREATININE: 0.96 mg/dL (ref 0.44–1.00)
Calcium: 8.8 mg/dL — ABNORMAL LOW (ref 8.9–10.3)
GFR calc non Af Amer: >60 mL/min (ref >60)
GLUCOSE: 99 mg/dL (ref 65–99)
Potassium: 3.6 mmol/L (ref 3.5–5.1)
SODIUM: 137 mmol/L (ref 135–145)
Total Bilirubin: 0.8 mg/dL (ref 0.3–1.2)
Total Protein: 6.7 g/dL (ref 6.5–8.1)

## 2017-04-05 LAB — URINALYSIS, ROUTINE W REFLEX MICROSCOPIC
Bilirubin Urine: NEGATIVE
Glucose, UA: NEGATIVE mg/dL
KETONES UR: NEGATIVE mg/dL
Leukocytes, UA: NEGATIVE
Nitrite: NEGATIVE
PROTEIN: NEGATIVE mg/dL
Specific Gravity, Urine: 1.013 (ref 1.005–1.030)
pH: 6 (ref 5.0–8.0)

## 2017-04-05 LAB — CBC
HCT: 35.2 % — ABNORMAL LOW (ref 36.0–46.0)
HEMOGLOBIN: 11.2 g/dL — AB (ref 12.0–15.0)
MCH: 27.2 pg (ref 26.0–34.0)
MCHC: 31.8 g/dL (ref 30.0–36.0)
MCV: 85.4 fL (ref 78.0–100.0)
Platelets: 372 10*3/uL (ref 150–400)
RBC: 4.12 MIL/uL (ref 3.87–5.11)
RDW: 14 % (ref 11.5–15.5)
WBC: 10.9 10*3/uL — ABNORMAL HIGH (ref 4.0–10.5)

## 2017-04-05 LAB — I-STAT BETA HCG BLOOD, ED (MC, WL, AP ONLY)

## 2017-04-05 LAB — LIPASE, BLOOD: LIPASE: 28 U/L (ref 11–51)

## 2017-04-05 NOTE — ED Triage Notes (Addendum)
Per Pt, Pt is coming from home with complaints of lower abdominal pain that started over a week ago. Pt was diagnosed with BV and Yeast infection one week ago, and she reports that she hasn't gotten her medication. Vaginal discharge noted to be red mucous consistency. Pt reports that her back has started to hurt and she had a late period. Complains of bilateral arm pain.   Pt reports one episode of lightheadedness, N/V/D, and chest tightness a couple days ago.
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
## 2017-05-29 ENCOUNTER — Encounter (HOSPITAL_COMMUNITY): Payer: Self-pay | Admitting: Emergency Medicine

## 2017-05-29 ENCOUNTER — Emergency Department (HOSPITAL_COMMUNITY): Admission: EM | Admit: 2017-05-29 | Discharge: 2017-05-29 | Discharge status: Against Medical Advice | Payer: Self-pay

## 2017-05-29 ENCOUNTER — Other Ambulatory Visit: Payer: Self-pay

## 2017-05-29 ENCOUNTER — Ambulatory Visit (HOSPITAL_COMMUNITY)
Admission: EM | Admit: 2017-05-29 | Discharge: 2017-05-29 | Disposition: A | Discharge status: Home / Self Care | Payer: Self-pay | Attending: Family Medicine | Admitting: Family Medicine

## 2017-05-29 DIAGNOSIS — N83209 Unspecified ovarian cyst, unspecified side: Secondary | ICD-10-CM | POA: Insufficient documentation

## 2017-05-29 DIAGNOSIS — G43909 Migraine, unspecified, not intractable, without status migrainosus: Secondary | ICD-10-CM | POA: Insufficient documentation

## 2017-05-29 DIAGNOSIS — R197 Diarrhea, unspecified: Secondary | ICD-10-CM | POA: Insufficient documentation

## 2017-05-29 DIAGNOSIS — F329 Major depressive disorder, single episode, unspecified: Secondary | ICD-10-CM | POA: Insufficient documentation

## 2017-05-29 DIAGNOSIS — Z880 Allergy status to penicillin: Secondary | ICD-10-CM | POA: Insufficient documentation

## 2017-05-29 DIAGNOSIS — M419 Scoliosis, unspecified: Secondary | ICD-10-CM | POA: Insufficient documentation

## 2017-05-29 DIAGNOSIS — R110 Nausea: Secondary | ICD-10-CM | POA: Insufficient documentation

## 2017-05-29 DIAGNOSIS — J45909 Unspecified asthma, uncomplicated: Secondary | ICD-10-CM | POA: Insufficient documentation

## 2017-05-29 DIAGNOSIS — I10 Essential (primary) hypertension: Secondary | ICD-10-CM | POA: Insufficient documentation

## 2017-05-29 DIAGNOSIS — R5383 Other fatigue: Secondary | ICD-10-CM | POA: Insufficient documentation

## 2017-05-29 DIAGNOSIS — R0789 Other chest pain: Secondary | ICD-10-CM | POA: Insufficient documentation

## 2017-05-29 DIAGNOSIS — Z87891 Personal history of nicotine dependence: Secondary | ICD-10-CM | POA: Insufficient documentation

## 2017-05-29 DIAGNOSIS — F419 Anxiety disorder, unspecified: Secondary | ICD-10-CM | POA: Insufficient documentation

## 2017-05-29 LAB — COMPREHENSIVE METABOLIC PANEL
ALK PHOS: 49 U/L (ref 38–126)
ALT: 21 U/L (ref 14–54)
AST: 29 U/L (ref 15–41)
Albumin: 3.9 g/dL (ref 3.5–5.0)
Anion gap: 15 (ref 5–15)
BILIRUBIN TOTAL: 0.5 mg/dL (ref 0.3–1.2)
BUN: 5 mg/dL — AB (ref 6–20)
CALCIUM: 9.8 mg/dL (ref 8.9–10.3)
CO2: 22 mmol/L (ref 22–32)
CREATININE: 1.09 mg/dL — AB (ref 0.44–1.00)
Chloride: 100 mmol/L — ABNORMAL LOW (ref 101–111)
GFR calc Af Amer: >60 mL/min (ref >60)
Glucose, Bld: 142 mg/dL — ABNORMAL HIGH (ref 65–99)
Potassium: 3.2 mmol/L — ABNORMAL LOW (ref 3.5–5.1)
Sodium: 137 mmol/L (ref 135–145)
Total Protein: 7.7 g/dL (ref 6.5–8.1)

## 2017-05-29 LAB — CBC
HCT: 39.8 % (ref 36.0–46.0)
Hemoglobin: 13 g/dL (ref 12.0–15.0)
MCH: 27.3 pg (ref 26.0–34.0)
MCHC: 32.7 g/dL (ref 30.0–36.0)
MCV: 83.4 fL (ref 78.0–100.0)
PLATELETS: 487 10*3/uL — AB (ref 150–400)
RBC: 4.77 MIL/uL (ref 3.87–5.11)
RDW: 14.1 % (ref 11.5–15.5)
WBC: 10.5 10*3/uL (ref 4.0–10.5)

## 2017-05-29 LAB — TSH: TSH: 1.279 u[IU]/mL (ref 0.350–4.500)

## 2017-05-29 MED ORDER — ONDANSETRON 4 MG PO TBDP: 4.0000 mg | ORAL_TABLET | Freq: Three times a day (TID) | ORAL | 0 refills | Status: DC | PRN

## 2017-05-29 NOTE — ED Notes (Signed)
Obtained urine

## 2017-05-29 NOTE — ED Triage Notes (Signed)
Pt c/o chest tightness, having a hard time breathing since Sunday. Pt states last two days shes had diarrhea, and constipation. Pt also states "my left kidney has been acting up". Pt respirations are equal and unlabored, speaking in complete sentences.

## 2017-05-30 LAB — POCT URINALYSIS DIP (DEVICE)
Bilirubin Urine: NEGATIVE
GLUCOSE, UA: NEGATIVE mg/dL
Ketones, ur: NEGATIVE mg/dL
LEUKOCYTES UA: NEGATIVE
Nitrite: NEGATIVE
PROTEIN: NEGATIVE mg/dL
Specific Gravity, Urine: 1.02 (ref 1.005–1.030)
UROBILINOGEN UA: 0.2 mg/dL (ref 0.0–1.0)
pH: 6 (ref 5.0–8.0)

## 2017-05-30 LAB — POCT PREGNANCY, URINE: PREG TEST UR: NEGATIVE

## 2017-06-01 ENCOUNTER — Encounter (HOSPITAL_COMMUNITY): Payer: Self-pay | Admitting: Nurse Practitioner

## 2017-06-01 ENCOUNTER — Emergency Department (HOSPITAL_COMMUNITY)
Admission: EM | Admit: 2017-06-01 | Discharge: 2017-06-02 | Disposition: A | Discharge status: Home / Self Care | Payer: Self-pay | Attending: Emergency Medicine | Admitting: Emergency Medicine

## 2017-06-01 DIAGNOSIS — Z5321 Procedure and treatment not carried out due to patient leaving prior to being seen by health care provider: Secondary | ICD-10-CM | POA: Insufficient documentation

## 2017-06-01 DIAGNOSIS — R109 Unspecified abdominal pain: Secondary | ICD-10-CM | POA: Insufficient documentation

## 2017-06-01 LAB — BASIC METABOLIC PANEL
Anion gap: 5 (ref 5–15)
BUN: 7 mg/dL (ref 6–20)
CALCIUM: 8.8 mg/dL — AB (ref 8.9–10.3)
CO2: 25 mmol/L (ref 22–32)
CREATININE: 0.81 mg/dL (ref 0.44–1.00)
Chloride: 109 mmol/L (ref 101–111)
GFR calc non Af Amer: >60 mL/min (ref >60)
Glucose, Bld: 73 mg/dL (ref 65–99)
Potassium: 3.3 mmol/L — ABNORMAL LOW (ref 3.5–5.1)
SODIUM: 139 mmol/L (ref 135–145)

## 2017-06-01 LAB — CBC
HCT: 34.6 % — ABNORMAL LOW (ref 36.0–46.0)
Hemoglobin: 11.1 g/dL — ABNORMAL LOW (ref 12.0–15.0)
MCH: 27.2 pg (ref 26.0–34.0)
MCHC: 32.1 g/dL (ref 30.0–36.0)
MCV: 84.8 fL (ref 78.0–100.0)
PLATELETS: 407 10*3/uL — AB (ref 150–400)
RBC: 4.08 MIL/uL (ref 3.87–5.11)
RDW: 14.5 % (ref 11.5–15.5)
WBC: 8.8 10*3/uL (ref 4.0–10.5)

## 2017-06-01 LAB — URINALYSIS, ROUTINE W REFLEX MICROSCOPIC
Bilirubin Urine: NEGATIVE
Glucose, UA: NEGATIVE mg/dL
Hgb urine dipstick: NEGATIVE
Ketones, ur: NEGATIVE mg/dL
LEUKOCYTES UA: NEGATIVE
NITRITE: NEGATIVE
PROTEIN: NEGATIVE mg/dL
Specific Gravity, Urine: 1.02 (ref 1.005–1.030)
pH: 7 (ref 5.0–8.0)

## 2017-06-01 LAB — I-STAT BETA HCG BLOOD, ED (MC, WL, AP ONLY)

## 2017-06-01 NOTE — ED Notes (Signed)
Pt stated she needed to catch the bus and couldn't wait

## 2017-06-01 NOTE — ED Triage Notes (Signed)
Pt is of c/o flank pain that she is attributing to kidney issues she wishes to re-evaluated for. Last evaluation 05/29/17, states no improvement.

## 2017-06-03 NOTE — ED Provider Notes (Signed)
[place]   [id number] 05/29/17 Arrival Time: 9485  ASSESSMENT & PLAN:  1. Diarrhea, unspecified type   2. Fatigue, unspecified type   3. Nausea without vomiting     Meds ordered this encounter  Medications  . ondansetron (ZOFRAN-ODT) 4 MG disintegrating tablet    Sig: Take 1 tablet (4 mg total) by mouth every 8 (eight) hours as needed for nausea or vomiting.    Dispense:  15 tablet    Refill:  0   Will do her best to ensure adequate fluid intake in order to avoid dehydration. Will proceed to the Emergency Department for evaluation if unable to tolerate PO fluids regularly.  Asthma symptoms controlled. May f/u here if not seeing improvement in GI symptoms over the next few days. Suspect viral illness.  See labs below-reviewed.  Reviewed expectations re: course of current medical issues. Questions answered. Outlined signs and symptoms indicating need for more acute intervention. Patient verbalized understanding. After Visit Summary given.   SUBJECTIVE: History from: patient.  Dana Hughes is a 37 y.o. female who presents with complaint of intermittent nausea in the absence of vomiting (mild) and loose stools. Onset gradual, 2 days ago. Abdominal discomfort: occasional and cramping with bowel movements. Symptoms are stable since beginning. Aggravating factors: eating. Alleviating factors: none. Associated symptoms: none. She denies arthralgias, dysuria, fever and sweats. Appetite: normal. PO intake: decreased. Ambulatory without assistance. Urinary symptoms: none. Last bowel movement this morning without blood. OTC treatment: None. Overall fatigue also mentioned.  Also reports mild asthma symptoms over the past couple of days with "chest tightness". Wheezing noticeable at times. No SOB reported. No recent URI symptoms. Albuterol inhaler provides relief.  Patient's last menstrual period was 03/28/2017.  Past Surgical History:  Procedure Laterality Date    . CHOLECYSTECTOMY    . TUBAL LIGATION      ROS: As per HPI.  OBJECTIVE:  Vitals:   05/29/17 1500  BP: 114/81  Pulse: 96  Resp: 18  Temp: 98.5 F (36.9 C)  SpO2: 97%    General appearance: alert; no distress Lungs: clear to auscultation bilaterally; no wheezing; speaks in full sentences Heart: regular rate and rhythm Abdomen: soft; non-distended; no significant abdominal tenderness, "just a cramping feeling"; bowel sounds present; no masses or organomegaly; no guarding or rebound tenderness Back: no CVA tenderness Extremities: no edema; symmetrical with no gross deformities Skin: warm and dry Neurologic: normal gait Psychological: alert and cooperative; normal mood and affect  Labs: Results for orders placed or performed during the hospital encounter of 05/29/17  Comprehensive metabolic panel  Result Value Ref Range   Sodium 137 135 - 145 mmol/L   Potassium 3.2 (L) 3.5 - 5.1 mmol/L   Chloride 100 (L) 101 - 111 mmol/L   CO2 22 22 - 32 mmol/L   Glucose, Bld 142 (H) 65 - 99 mg/dL   BUN 5 (L) 6 - 20 mg/dL   Creatinine, Ser 1.09 (H) 0.44 - 1.00 mg/dL   Calcium 9.8 8.9 - 10.3 mg/dL   Total Protein 7.7 6.5 - 8.1 g/dL   Albumin 3.9 3.5 - 5.0 g/dL   AST 29 15 - 41 U/L   ALT 21 14 - 54 U/L   Alkaline Phosphatase 49 38 - 126 U/L   Total Bilirubin 0.5 0.3 - 1.2 mg/dL   GFR calc non Af Amer >60 >60 mL/min   GFR calc Af Amer >60 >60 mL/min   Anion gap 15 5 - 15  CBC  Result Value Ref Range   WBC 10.5 4.0 - 10.5 K/uL   RBC 4.77 3.87 - 5.11 MIL/uL   Hemoglobin 13.0 12.0 - 15.0 g/dL   HCT 39.8 36.0 - 46.0 %   MCV 83.4 78.0 - 100.0 fL   MCH 27.3 26.0 - 34.0 pg   MCHC 32.7 30.0 - 36.0 g/dL   RDW 14.1 11.5 - 15.5 %   Platelets 487 (H) 150 - 400 K/uL  TSH  Result Value Ref Range   TSH 1.279 0.350 - 4.500 uIU/mL  POCT urinalysis dip (device)  Result Value Ref Range   Glucose, UA NEGATIVE NEGATIVE mg/dL   Bilirubin Urine NEGATIVE NEGATIVE   Ketones, ur NEGATIVE NEGATIVE  mg/dL   Specific Gravity, Urine 1.020 1.005 - 1.030   Hgb urine dipstick TRACE (A) NEGATIVE   pH 6.0 5.0 - 8.0   Protein, ur NEGATIVE NEGATIVE mg/dL   Urobilinogen, UA 0.2 0.0 - 1.0 mg/dL   Nitrite NEGATIVE NEGATIVE   Leukocytes, UA NEGATIVE NEGATIVE  Pregnancy, urine POC  Result Value Ref Range   Preg Test, Ur NEGATIVE NEGATIVE   Labs Reviewed  COMPREHENSIVE METABOLIC PANEL - Abnormal; Notable for the following components:      Result Value   Potassium 3.2 (*)    Chloride 100 (*)    Glucose, Bld 142 (*)    BUN 5 (*)    Creatinine, Ser 1.09 (*)    All other components within normal limits  CBC - Abnormal; Notable for the following components:   Platelets 487 (*)    All other components within normal limits  POCT URINALYSIS DIP (DEVICE) - Abnormal; Notable for the following components:   Hgb urine dipstick TRACE (*)    All other components within normal limits  TSH  POCT PREGNANCY, URINE    Allergies  Allergen Reactions  . Mushroom Extract Complex Anaphylaxis  . Penicillins Itching    Has patient had a PCN reaction causing immediate rash, facial/tongue/throat swelling, SOB or lightheadedness with hypotension: No Has patient had a PCN reaction causing severe rash involving mucus membranes or skin necrosis: No Has patient had a PCN reaction that required hospitalization No Has patient had a PCN reaction occurring within the last 10 years: No If all of the above answers are "NO", then may proceed with Cephalosporin use.                                                Past Medical History:  Diagnosis Date  . Anxiety   . Arthritis   . Asthma   . Depression   . Headache(784.0)    migraines  . Hypertension    no longer takes meds  . Muscle spasm   . Ovarian cyst   . Scoliosis    Social History   Socioeconomic History  . Marital status: Single    Spouse name: Not on file  . Number of children: Not on file  . Years of education: Not on file  . Highest education  level: Not on file  Social Needs  . Financial resource strain: Not on file  . Food insecurity - worry: Not on file  . Food insecurity - inability: Not on file  . Transportation needs - medical: Not on file  . Transportation needs - non-medical: Not on file  Occupational History  . Not on file  Tobacco Use  . Smoking status: Former Research scientist (life sciences)  . Smokeless tobacco: Never Used  Substance and Sexual Activity  . Alcohol use: Yes    Comment: rare  . Drug use: No  . Sexual activity: Yes    Birth control/protection: Condom, Surgical  Other Topics Concern  . Not on file  Social History Narrative  . Not on file   Family History  Problem Relation Age of Onset  . Cancer Father   . Hypertension Maternal Aunt   . Breast cancer Maternal Aunt   . Cancer Paternal Grandmother   . Breast cancer Paternal Grandmother   . Breast cancer Paternal Herbert Moors, MD 06/03/17 [phone]

## 2017-07-07 ENCOUNTER — Inpatient Hospital Stay (HOSPITAL_COMMUNITY)
Admission: AD | Admit: 2017-07-07 | Discharge: 2017-07-07 | Disposition: A | Discharge status: Home / Self Care | Payer: Self-pay | Source: Ambulatory Visit | Attending: Obstetrics & Gynecology | Admitting: Obstetrics & Gynecology

## 2017-07-07 ENCOUNTER — Other Ambulatory Visit: Payer: Self-pay

## 2017-07-07 ENCOUNTER — Encounter (HOSPITAL_COMMUNITY): Payer: Self-pay

## 2017-07-07 DIAGNOSIS — J45909 Unspecified asthma, uncomplicated: Secondary | ICD-10-CM | POA: Insufficient documentation

## 2017-07-07 DIAGNOSIS — Z87891 Personal history of nicotine dependence: Secondary | ICD-10-CM | POA: Insufficient documentation

## 2017-07-07 DIAGNOSIS — R102 Pelvic and perineal pain: Secondary | ICD-10-CM | POA: Insufficient documentation

## 2017-07-07 DIAGNOSIS — Z3202 Encounter for pregnancy test, result negative: Secondary | ICD-10-CM | POA: Insufficient documentation

## 2017-07-07 DIAGNOSIS — R509 Fever, unspecified: Secondary | ICD-10-CM | POA: Insufficient documentation

## 2017-07-07 DIAGNOSIS — F419 Anxiety disorder, unspecified: Secondary | ICD-10-CM | POA: Insufficient documentation

## 2017-07-07 DIAGNOSIS — K5900 Constipation, unspecified: Secondary | ICD-10-CM | POA: Insufficient documentation

## 2017-07-07 DIAGNOSIS — Z9049 Acquired absence of other specified parts of digestive tract: Secondary | ICD-10-CM | POA: Insufficient documentation

## 2017-07-07 DIAGNOSIS — R197 Diarrhea, unspecified: Secondary | ICD-10-CM | POA: Insufficient documentation

## 2017-07-07 DIAGNOSIS — I10 Essential (primary) hypertension: Secondary | ICD-10-CM | POA: Insufficient documentation

## 2017-07-07 DIAGNOSIS — Z880 Allergy status to penicillin: Secondary | ICD-10-CM | POA: Insufficient documentation

## 2017-07-07 DIAGNOSIS — R1084 Generalized abdominal pain: Secondary | ICD-10-CM | POA: Insufficient documentation

## 2017-07-07 DIAGNOSIS — F329 Major depressive disorder, single episode, unspecified: Secondary | ICD-10-CM | POA: Insufficient documentation

## 2017-07-07 DIAGNOSIS — Z79899 Other long term (current) drug therapy: Secondary | ICD-10-CM | POA: Insufficient documentation

## 2017-07-07 LAB — CBC
HEMATOCRIT: 36.1 % (ref 36.0–46.0)
Hemoglobin: 11.9 g/dL — ABNORMAL LOW (ref 12.0–15.0)
MCH: 27.7 pg (ref 26.0–34.0)
MCHC: 33 g/dL (ref 30.0–36.0)
MCV: 84.1 fL (ref 78.0–100.0)
PLATELETS: 398 10*3/uL (ref 150–400)
RBC: 4.29 MIL/uL (ref 3.87–5.11)
RDW: 14.9 % (ref 11.5–15.5)
WBC: 8.3 10*3/uL (ref 4.0–10.5)

## 2017-07-07 LAB — COMPREHENSIVE METABOLIC PANEL
ALT: 19 U/L (ref 14–54)
AST: 18 U/L (ref 15–41)
Albumin: 4 g/dL (ref 3.5–5.0)
Alkaline Phosphatase: 45 U/L (ref 38–126)
Anion gap: 11 (ref 5–15)
BUN: 6 mg/dL (ref 6–20)
CHLORIDE: 105 mmol/L (ref 101–111)
CO2: 21 mmol/L — ABNORMAL LOW (ref 22–32)
Calcium: 8.7 mg/dL — ABNORMAL LOW (ref 8.9–10.3)
Creatinine, Ser: 0.7 mg/dL (ref 0.44–1.00)
GFR calc Af Amer: >60 mL/min (ref >60)
GLUCOSE: 85 mg/dL (ref 65–99)
Potassium: 3.4 mmol/L — ABNORMAL LOW (ref 3.5–5.1)
Sodium: 137 mmol/L (ref 135–145)
Total Bilirubin: 1 mg/dL (ref 0.3–1.2)
Total Protein: 7.7 g/dL (ref 6.5–8.1)

## 2017-07-07 LAB — URINALYSIS, ROUTINE W REFLEX MICROSCOPIC
BILIRUBIN URINE: NEGATIVE
Glucose, UA: NEGATIVE mg/dL
Hgb urine dipstick: NEGATIVE
Ketones, ur: NEGATIVE mg/dL
Leukocytes, UA: NEGATIVE
NITRITE: NEGATIVE
Protein, ur: NEGATIVE mg/dL
SPECIFIC GRAVITY, URINE: 1.019 (ref 1.005–1.030)
pH: 8 (ref 5.0–8.0)

## 2017-07-07 LAB — POCT PREGNANCY, URINE: PREG TEST UR: NEGATIVE

## 2017-07-07 MED ORDER — KETOROLAC TROMETHAMINE 60 MG/2ML IM SOLN
60.0000 mg | Freq: Once | INTRAMUSCULAR | Status: AC
  Administered 2017-07-07: 60 mg via INTRAMUSCULAR
  Filled 2017-07-07: qty 2

## 2017-07-07 MED ORDER — TRAMADOL HCL 50 MG PO TABS
50.0000 mg | ORAL_TABLET | Freq: Once | ORAL | Status: AC
  Administered 2017-07-07: 50 mg via ORAL
  Filled 2017-07-07: qty 1

## 2017-07-07 NOTE — Discharge Instructions (Signed)
In late 2019, the [facility] will be moving to the [place] campus. At that time, the MAU (Maternity Admissions Unit), where you are being seen today, will no longer take care of non-pregnant patients. We strongly encourage you to find a doctor's office before that time, so that you can be seen with any GYN concerns, like vaginal discharge, urinary tract infection, etc.. in a timely manner. ° °In order to make an office visit more convenient, the [facility] will be offering evening hours with same-day appointments, walk-in appointments and scheduled appointments available during this time. ° °[facility] Hours: °Monday - 8am - 7:30 pm with walk-in between 4pm- 7:30 pm °Tuesday - 8 am - 5 pm (starting 07/23/17 we will be open late and accepting walk-ins from 4pm - 7:30pm) °Wednesday - 8 am - 5 pm (starting 10/23/17 we will be open late and accepting walk-ins from 4pm - 7:30pm) °Thursday 8 am - 5 pm (starting 01/23/18 we will be open late and accepting walk-ins from 4pm - 7:30pm) °Friday 8 am - 5 pm ° °For an appointment please call the [facility] at [phone] ° °For urgent needs, [facility] is also available for management of urgent GYN complaints such as vaginal discharge or urinary tract infections. ° ° ° ° ° °

## 2017-07-07 NOTE — MAU Provider Note (Signed)
History     CSN: [id number]  Arrival date and time: 07/07/17 1439   First Provider Initiated Contact with Patient 07/07/17 1520     Chief Complaint  Patient presents with  . Abdominal Pain  . Fever   HPI Dana Hughes is a 37 y.o. [phone] non pregnant female who presents with many complaints, primarily abdominal pain. She states it is mid abdominal pain that starts on the left and wraps around to the right side. She states this pain has been ongoing since 2006 when she had her tubal ligation and thinks it is because of the "clips moving around." She is also concerned that the pain is from gallstones even though she had her gallbladder removed. She states she sometimes has nausea and a "bad taste in my mouth." She thinks she may have had a fever at home but did not check her temperature. She tried ibuprofen on Friday for the pain with no relief. She also reports feeling anxious today and "maybe having a panic attack." She reports alternating between constipation and diarrhea and the pain is worse when she is constipated. Patient requesting ultrasound to look at clips from her tubal.   OB History    Gravida Para Term Preterm AB Living   4 3 3  0 1 3   SAB TAB Ectopic Multiple Live Births   1 0 0 0 3      Obstetric Comments   SVD x 3      Past Medical History:  Diagnosis Date  . Anxiety   . Arthritis   . Asthma   . Depression   . Headache(784.0)    migraines  . Hypertension    no longer takes meds  . Muscle spasm   . Ovarian cyst   . Scoliosis     Past Surgical History:  Procedure Laterality Date  . CHOLECYSTECTOMY    . TUBAL LIGATION      Family History  Problem Relation Age of Onset  . Cancer Father   . Hypertension Maternal Aunt   . Breast cancer Maternal Aunt   . Cancer Paternal Grandmother   . Breast cancer Paternal Grandmother   . Breast cancer Paternal Aunt     Social History   Tobacco Use  . Smoking status: Former Research scientist (life sciences)  . Smokeless tobacco:  Never Used  Substance Use Topics  . Alcohol use: Yes    Comment: rare  . Drug use: No    Allergies:  Allergies  Allergen Reactions  . Mushroom Extract Complex Anaphylaxis  . Latex Itching and Swelling  . Penicillins Itching    Has patient had a PCN reaction causing immediate rash, facial/tongue/throat swelling, SOB or lightheadedness with hypotension: No Has patient had a PCN reaction causing severe rash involving mucus membranes or skin necrosis: No Has patient had a PCN reaction that required hospitalization No Has patient had a PCN reaction occurring within the last 10 years: No If all of the above answers are "NO", then may proceed with Cephalosporin use.     Medications Prior to Admission  Medication Sig Dispense Refill Last Dose  . albuterol (PROVENTIL HFA;VENTOLIN HFA) 108 (90 Base) MCG/ACT inhaler Inhale 2 puffs into the lungs every 6 (six) hours as needed for wheezing or shortness of breath. Reported on 09/29/2015   Past Week at Unknown time  . ibuprofen (ADVIL,MOTRIN) 800 MG tablet Take 1 tablet (800 mg total) by mouth every 8 (eight) hours as needed. 30 tablet 5 Past Week at Unknown time  .  triamterene-hydrochlorothiazide (DYAZIDE) 37.5-25 MG capsule Take 1 each (1 capsule total) by mouth every morning. 30 capsule 11 07/06/2017 at Unknown time  . ondansetron (ZOFRAN-ODT) 4 MG disintegrating tablet Take 1 tablet (4 mg total) by mouth every 8 (eight) hours as needed for nausea or vomiting. (Patient not taking: Reported on 07/07/2017) 15 tablet 0 Not Taking at Unknown time  . predniSONE (STERAPRED UNI-PAK 21 TAB) 10 MG (21) TBPK tablet Take by mouth daily. Take as directed. (Patient not taking: Reported on 03/21/2017) 21 tablet 0 Completed Course at Unknown time    Review of Systems  Constitutional: Positive for fever. Negative for fatigue.  HENT: Negative.   Respiratory: Negative.  Negative for shortness of breath.   Cardiovascular: Negative.  Negative for chest pain.   Gastrointestinal: Positive for abdominal pain, constipation, diarrhea and nausea. Negative for vomiting.  Genitourinary: Negative.  Negative for dysuria, vaginal bleeding and vaginal discharge.  Neurological: Negative.  Negative for dizziness and headaches.   Physical Exam   Blood pressure (!) 127/96, pulse 92, temperature 98.5 F (36.9 C), temperature source Oral, resp. rate 18.  Physical Exam  Nursing note and vitals reviewed. Constitutional: She is oriented to person, place, and time. She appears well-developed and well-nourished. No distress.  HENT:  Head: Normocephalic.  Eyes: Pupils are equal, round, and reactive to light.  Cardiovascular: Normal rate, regular rhythm and normal heart sounds.  Respiratory: Effort normal and breath sounds normal. No respiratory distress.  GI: Soft. Bowel sounds are normal. She exhibits no distension and no mass. There is no tenderness. There is no rebound and no guarding.  Neurological: She is alert and oriented to person, place, and time.  Skin: Skin is warm and dry.  Psychiatric: She has a normal mood and affect. Her behavior is normal. Judgment and thought content normal.    MAU Course  Procedures Results for orders placed or performed during the hospital encounter of 07/07/17 (from the past 24 hour(s))  Urinalysis, Routine w reflex microscopic     Status: None   Collection Time: 07/07/17  3:01 PM  Result Value Ref Range   Color, Urine YELLOW YELLOW   APPearance CLEAR CLEAR   Specific Gravity, Urine 1.019 1.005 - 1.030   pH 8.0 5.0 - 8.0   Glucose, UA NEGATIVE NEGATIVE mg/dL   Hgb urine dipstick NEGATIVE NEGATIVE   Bilirubin Urine NEGATIVE NEGATIVE   Ketones, ur NEGATIVE NEGATIVE mg/dL   Protein, ur NEGATIVE NEGATIVE mg/dL   Nitrite NEGATIVE NEGATIVE   Leukocytes, UA NEGATIVE NEGATIVE  CBC     Status: Abnormal   Collection Time: 07/07/17  3:39 PM  Result Value Ref Range   WBC 8.3 4.0 - 10.5 K/uL   RBC 4.29 3.87 - 5.11 MIL/uL    Hemoglobin 11.9 (L) 12.0 - 15.0 g/dL   HCT 36.1 36.0 - 46.0 %   MCV 84.1 78.0 - 100.0 fL   MCH 27.7 26.0 - 34.0 pg   MCHC 33.0 30.0 - 36.0 g/dL   RDW 14.9 11.5 - 15.5 %   Platelets 398 150 - 400 K/uL  Comprehensive metabolic panel     Status: Abnormal   Collection Time: 07/07/17  3:39 PM  Result Value Ref Range   Sodium 137 135 - 145 mmol/L   Potassium 3.4 (L) 3.5 - 5.1 mmol/L   Chloride 105 101 - 111 mmol/L   CO2 21 (L) 22 - 32 mmol/L   Glucose, Bld 85 65 - 99 mg/dL   BUN 6 6 -  20 mg/dL   Creatinine, Ser 0.70 0.44 - 1.00 mg/dL   Calcium 8.7 (L) 8.9 - 10.3 mg/dL   Total Protein 7.7 6.5 - 8.1 g/dL   Albumin 4.0 3.5 - 5.0 g/dL   AST 18 15 - 41 U/L   ALT 19 14 - 54 U/L   Alkaline Phosphatase 45 38 - 126 U/L   Total Bilirubin 1.0 0.3 - 1.2 mg/dL   GFR calc non Af Amer >60 >60 mL/min   GFR calc Af Amer >60 >60 mL/min   Anion gap 11 5 - 15  Pregnancy, urine POC     Status: None   Collection Time: 07/07/17  4:32 PM  Result Value Ref Range   Preg Test, Ur NEGATIVE NEGATIVE     MDM UA, UPT CBC, CMP Toradol 60mg  IM  VSS. Low suspicion for appendicitis due to location of pain and absence of fever, leukocytosis and GI complaints.   Patient reported no relief from pain after toradol. Ultram 50mg  PO  Patient came to desk stating she needs to leave. Has not given enough time to see if ultram worked.  Assessment and Plan   1. Generalized abdominal pain   2. Pregnancy examination or test, negative result   3. Pelvic pain in female    -Discharge home in stable condition -Encouraged patient to use ibuprofen for pain management. No new prescriptions because patient unable to stay for reevaluation. -Outpatient ultrasound ordered for evaluation of BTL clips -Appendicitis precautions discussed -Patient advised to follow-up with [facility] or [facility] if pain continued.  Wende Mott CNM 07/07/2017, 3:31 PM

## 2017-07-07 NOTE — MAU Note (Addendum)
Unsure if pregnant but here dt fever and abdominal pain. States might be gall stones but had gallbladder removed. Denies bleeding. Pain 10/10 states pain started Saturday but been going on for past 4 months. Each year pt states getting worse.   Last month went to urgent care and saw dr. Jodi Mourning.

## 2017-07-19 ENCOUNTER — Ambulatory Visit (HOSPITAL_COMMUNITY): Payer: Medicaid Other

## 2017-07-23 ENCOUNTER — Ambulatory Visit (HOSPITAL_COMMUNITY)
Admission: RE | Admit: 2017-07-23 | Discharge: 2017-07-23 | Disposition: A | Discharge status: Home / Self Care | Payer: Self-pay | Source: Ambulatory Visit

## 2017-07-23 DIAGNOSIS — R102 Pelvic and perineal pain: Secondary | ICD-10-CM | POA: Insufficient documentation

## 2017-07-23 DIAGNOSIS — D259 Leiomyoma of uterus, unspecified: Secondary | ICD-10-CM | POA: Insufficient documentation

## 2017-07-24 ENCOUNTER — Ambulatory Visit: Payer: Medicaid Other | Admitting: Obstetrics & Gynecology

## 2017-07-26 ENCOUNTER — Telehealth: Payer: Self-pay | Admitting: General Practice

## 2017-07-26 ENCOUNTER — Telehealth: Payer: Self-pay | Admitting: *Deleted

## 2017-07-26 DIAGNOSIS — R1030 Lower abdominal pain, unspecified: Secondary | ICD-10-CM

## 2017-07-26 NOTE — Telephone Encounter (Signed)
Patient calling for u/s results. She is worried and does not want to wait until 5/2 to know what is wrong with her. Please return her call.

## 2017-07-26 NOTE — Telephone Encounter (Signed)
Patient called into office requesting ultrasound results. Reviewed/explained ultrasound results with patient. Patient asked if they saw her clamps on the ultrasound. Told patient looking at her OP note, clamps were not used only sutures/stitches. Patient asked what is causing the pain in her belly. Patient reports pain starting on the right going to the left and shooting up her stomach. Told patient that it could be related to her intestines/stomach and recommended scheduling appt with PCP. Provided [facility] information. Patient verbalized understanding & had no other questions

## 2017-08-22 ENCOUNTER — Ambulatory Visit: Payer: Medicaid Other | Admitting: Obstetrics & Gynecology

## 2017-08-28 ENCOUNTER — Ambulatory Visit (HOSPITAL_COMMUNITY)
Admission: EM | Admit: 2017-08-28 | Discharge: 2017-08-28 | Disposition: A | Discharge status: Home / Self Care | Payer: Self-pay | Attending: Family Medicine | Admitting: Family Medicine

## 2017-08-28 ENCOUNTER — Encounter (HOSPITAL_COMMUNITY): Payer: Self-pay | Admitting: Emergency Medicine

## 2017-08-28 ENCOUNTER — Ambulatory Visit (INDEPENDENT_AMBULATORY_CARE_PROVIDER_SITE_OTHER): Payer: Self-pay

## 2017-08-28 DIAGNOSIS — M25512 Pain in left shoulder: Secondary | ICD-10-CM

## 2017-08-28 MED ORDER — NAPROXEN 500 MG PO TABS: 500.0000 mg | ORAL_TABLET | Freq: Two times a day (BID) | ORAL | 0 refills | Status: DC

## 2017-08-28 MED ORDER — CYCLOBENZAPRINE HCL 5 MG PO TABS: 5.0000 mg | ORAL_TABLET | Freq: Every day | ORAL | 0 refills | Status: DC

## 2017-08-28 NOTE — ED Triage Notes (Signed)
Pt here for left shoulder pain x 10 weeks worse with movement

## 2017-08-28 NOTE — Discharge Instructions (Signed)
Activity as tolerated.  Ice of shoulder after use of it. Twice a day naproxen, take with food. Please follow up with PCP and/or sports medicine/orthopedics for persistent pain. Please follow up with primary care provider for BP recheck.

## 2017-08-28 NOTE — ED Provider Notes (Signed)
[place]    CSN: [id number] Arrival date & time: 08/28/17  1804     History   Chief Complaint Chief Complaint  Patient presents with  . Shoulder Pain    HPI Dana Hughes is a 37 y.o. female.   Cristie presents with complaints of left shoulder pain which she feels started after she slept on her arm and shoulder wrong approximately 10 weeks ago. She lifts a fair amount at work as well which she feels worsens her pain. She is right handed. Denies numbness or tingling. Pain is worse with external rotation and abduction of arm. Has not been seen for this. Had been taking ibuprofen but ran out. It minimally helped. Denies  Any previous shoulder injury. Does not have a current PCP. Has not been on BP medications for 4 months. Hx asthma, arthritis, migraines, htn, scoliosis.    ROS per HPI.      Past Medical History:  Diagnosis Date  . Anxiety   . Arthritis   . Asthma   . Depression   . Headache(784.0)    migraines  . Hypertension    no longer takes meds  . Muscle spasm   . Ovarian cyst   . Scoliosis     Patient Active Problem List   Diagnosis Date Noted  . Pelvic pain in female 09/29/2015  . Abnormal uterine bleeding 09/29/2015    Past Surgical History:  Procedure Laterality Date  . CHOLECYSTECTOMY    . TUBAL LIGATION      OB History    Gravida  4   Para  3   Term  3   Preterm  0   AB  1   Living  3     SAB  1   TAB  0   Ectopic  0   Multiple  0   Live Births  3        Obstetric Comments  SVD x 3         Home Medications    Prior to Admission medications   Medication Sig Start Date End Date Taking? Authorizing Provider  albuterol (PROVENTIL HFA;VENTOLIN HFA) 108 (90 Base) MCG/ACT inhaler Inhale 2 puffs into the lungs every 6 (six) hours as needed for wheezing or shortness of breath. Reported on 09/29/2015    [provider]  cyclobenzaprine (FLEXERIL) 5 MG tablet Take 1 tablet (5 mg total) by mouth at  bedtime. 08/28/17   Zigmund Gottron, NP  ibuprofen (ADVIL,MOTRIN) 800 MG tablet Take 1 tablet (800 mg total) by mouth every 8 (eight) hours as needed. 03/21/17   Shelly Bombard, MD  naproxen (NAPROSYN) 500 MG tablet Take 1 tablet (500 mg total) by mouth 2 (two) times daily. 08/28/17   Zigmund Gottron, NP  triamterene-hydrochlorothiazide (DYAZIDE) 37.5-25 MG capsule Take 1 each (1 capsule total) by mouth every morning. 03/21/17   Shelly Bombard, MD    Family History Family History  Problem Relation Age of Onset  . Cancer Father   . Hypertension Maternal Aunt   . Breast cancer Maternal Aunt   . Cancer Paternal Grandmother   . Breast cancer Paternal Grandmother   . Breast cancer Paternal Aunt     Social History Social History   Tobacco Use  . Smoking status: Former Research scientist (life sciences)  . Smokeless tobacco: Never Used  Substance Use Topics  . Alcohol use: Yes    Comment: rare  . Drug use: No     Allergies  Mushroom extract complex; Latex; and Penicillins   Review of Systems Review of Systems   Physical Exam Triage Vital Signs ED Triage Vitals [08/28/17 1821]  Enc Vitals Group     BP 131/90     Pulse Rate 96     Resp 16     Temp 98.2 F (36.8 C)     Temp Source Oral     SpO2 96 %     Weight      Height      Head Circumference      Peak Flow      Pain Score      Pain Loc      Pain Edu?      Excl. in [place]?    No data found.  Updated Vital Signs BP 131/90 (BP Location: Right Arm)   Pulse 96   Temp 98.2 F (36.8 C) (Oral)   Resp 16   LMP 08/22/2017 (Exact Date)   SpO2 96%   Visual Acuity Right Eye Distance:   Left Eye Distance:   Bilateral Distance:    Right Eye Near:   Left Eye Near:    Bilateral Near:     Physical Exam  Constitutional: She is oriented to person, place, and time. She appears well-developed and well-nourished. No distress.  Cardiovascular: Normal rate, regular rhythm and normal heart sounds.  Pulmonary/Chest: Effort normal and breath  sounds normal.  Musculoskeletal:       Left shoulder: She exhibits decreased range of motion, tenderness and pain. She exhibits no bony tenderness, no swelling, no effusion, no crepitus, no deformity, no laceration, no spasm, normal pulse and normal strength.       Arms: Patient with complete arc of left shoulder, strength equal bilaterally with push/pulls and with internal and external rotation; pain with external rotation with arm behind back however; without bony tenderness; sensation intact and strong radial pulses   Neurological: She is alert and oriented to person, place, and time.  Skin: Skin is warm and dry.     UC Treatments / Results  Labs (all labs ordered are listed, but only abnormal results are displayed) Labs Reviewed - No data to display  EKG None  Radiology No results found.  Procedures Procedures (including critical care time)  Medications Ordered in UC Medications - No data to display  Initial Impression / Assessment and Plan / UC Course  I have reviewed the triage vital signs and the nursing notes.  Pertinent labs & imaging results that were available during my care of the patient were reviewed by me and considered in my medical decision making (see chart for details).     Shoulder pain consistent with strain, minimal limitations noted on physical exam. Xray without acute findings. Limit lifting x3 days, ROM exercises provided. Follow up with PCP and orthopedics. Patient verbalized understanding and agreeable to plan.     Final Clinical Impressions(s) / UC Diagnoses   Final diagnoses:  Left shoulder pain, unspecified chronicity     Discharge Instructions     Activity as tolerated.  Ice of shoulder after use of it. Twice a day naproxen, take with food. Please follow up with PCP and/or sports medicine/orthopedics for persistent pain. Please follow up with primary care provider for BP recheck.    ED Prescriptions    Medication Sig Dispense Auth.  Provider   naproxen (NAPROSYN) 500 MG tablet Take 1 tablet (500 mg total) by mouth 2 (two) times daily. 30 tablet Zigmund Gottron, NP  cyclobenzaprine (FLEXERIL) 5 MG tablet Take 1 tablet (5 mg total) by mouth at bedtime. 15 tablet Zigmund Gottron, NP     Controlled Substance Prescriptions [place] Controlled Substance Registry consulted? Not Applicable   Zigmund Gottron, NP 08/28/17 1925

## 2017-08-30 ENCOUNTER — Encounter: Payer: Self-pay | Admitting: Certified Nurse Midwife

## 2017-08-30 ENCOUNTER — Other Ambulatory Visit (HOSPITAL_COMMUNITY)
Admission: RE | Admit: 2017-08-30 | Discharge: 2017-08-30 | Disposition: A | Discharge status: Home / Self Care | Payer: Medicaid Other | Source: Ambulatory Visit | Attending: Certified Nurse Midwife | Admitting: Certified Nurse Midwife

## 2017-08-30 ENCOUNTER — Ambulatory Visit (INDEPENDENT_AMBULATORY_CARE_PROVIDER_SITE_OTHER): Payer: Self-pay | Admitting: Certified Nurse Midwife

## 2017-08-30 VITALS — BP 132/85 | HR 67 | Ht 63.0 in | Wt 224.1 lb

## 2017-08-30 DIAGNOSIS — N809 Endometriosis, unspecified: Secondary | ICD-10-CM

## 2017-08-30 DIAGNOSIS — O10919 Unspecified pre-existing hypertension complicating pregnancy, unspecified trimester: Secondary | ICD-10-CM

## 2017-08-30 DIAGNOSIS — N898 Other specified noninflammatory disorders of vagina: Secondary | ICD-10-CM

## 2017-08-30 DIAGNOSIS — R102 Pelvic and perineal pain: Secondary | ICD-10-CM

## 2017-08-30 DIAGNOSIS — G8929 Other chronic pain: Secondary | ICD-10-CM

## 2017-08-30 MED ORDER — AMLODIPINE BESYLATE 5 MG PO TABS: 5.0000 mg | ORAL_TABLET | Freq: Every day | ORAL | 3 refills | Status: DC

## 2017-08-30 MED ORDER — NORETHINDRONE ACETATE 5 MG PO TABS: 5.0000 mg | ORAL_TABLET | Freq: Every day | ORAL | 2 refills | Status: DC

## 2017-08-30 NOTE — Progress Notes (Addendum)
Subjective:     Patient ID: Dana Hughes, female   DOB: 06/22/1980, 37 y.o.   MRN: [id number]  Pelvic Pain  The patient's primary symptoms include pelvic pain and vaginal discharge. The patient's pertinent negatives include no genital itching, genital lesions or missed menses. This is a chronic problem. The current episode started more than 1 year ago. The problem occurs constantly (pain gets worse when she in on her cycle ). The pain is severe. The problem affects both sides. She is not pregnant. Associated symptoms include abdominal pain, dysuria, painful intercourse and urgency. Pertinent negatives include no back pain, constipation, diarrhea, discolored urine, flank pain or frequency. The vaginal discharge was white and milky. The symptoms are aggravated by intercourse, activity and urinating. She has tried acetaminophen and NSAIDs for the symptoms. The treatment provided no relief. She is sexually active. No, her partner does not have an STD. She uses tubal ligation for contraception. Her menstrual history has been regular.     Review of Systems  Respiratory: Negative.   Cardiovascular: Negative.   Gastrointestinal: Positive for abdominal pain. Negative for constipation and diarrhea.  Genitourinary: Positive for dysuria, pelvic pain, urgency and vaginal discharge. Negative for flank pain, frequency and missed menses.  Musculoskeletal: Negative for back pain.   Vitals:   08/30/17 1412  BP: 132/85  Pulse: 67      Objective:   Physical Exam  Constitutional: She is oriented to person, place, and time. She appears well-developed and well-nourished. No distress.  HENT:  Head: Normocephalic.  Eyes: Conjunctivae and EOM are normal.  Neck: Normal range of motion.  Cardiovascular: Normal rate, regular rhythm and normal heart sounds.  Pulmonary/Chest: Effort normal and breath sounds normal. No respiratory distress. She has no wheezes.  Abdominal: Soft. Bowel sounds are normal. There is  tenderness. There is no rebound and no guarding.  Genitourinary: Rectum normal. Uterus is tender. Cervix exhibits no motion tenderness and no friability. Right adnexum displays tenderness. Left adnexum displays tenderness. No tenderness or bleeding in the vagina. Vaginal discharge found.  Neurological: She is alert and oriented to person, place, and time.  Skin: Skin is warm and dry. She is not diaphoretic.  Psychiatric: She has a normal mood and affect. Her behavior is normal.      Assessment/Plan:     1. Vaginal discharge - Cervicovaginal ancillary only  2. Chronic female pelvic pain -has been taking NSAIDs and Tylenol for pain for over 6 months with no relieve of pain. She reports pain gets worse with intercourse and cycles.  - Urine Culture - norethindrone (AYGESTIN) 5 MG tablet; Take 1 tablet (5 mg total) by mouth daily.  Dispense: 30 tablet; Refill: 2  3. Chronic hypertension during pregnancy, antepartum -Patient not on any medication for hypertension. BP 132/85 today, reports BP ranges from 127/90s - 135/80s.  - amLODipine (NORVASC) 5 MG tablet; Take 1 tablet (5 mg total) by mouth daily.  Dispense: 30 tablet; Refill: 3  4. Endometriosis -Most like pain with endometriosis. Recent US on 4/2 showed 2 small fibroids- not usually  - norethindrone (AYGESTIN) 5 MG tablet; Take 1 tablet (5 mg total) by mouth daily.  Dispense: 30 tablet; Refill: 2   C/w Dr Harolyn Rutherford on assessment and management. Recommends starting patient on Norvasc and Aygestin for possible endometriosis.   Follow up in 1 month for endometriosis pain follow up. Start Aygestin and Norvasc today. Answered patient's questions. Patient agrees with POC.    Lajean Manes, CNM 08/30/17, 4:00  PM

## 2017-09-01 LAB — URINE CULTURE: Organism ID, Bacteria: NO GROWTH

## 2017-09-02 LAB — CERVICOVAGINAL ANCILLARY ONLY
Bacterial vaginitis: POSITIVE — AB
Candida vaginitis: NEGATIVE
Trichomonas: NEGATIVE

## 2017-09-05 ENCOUNTER — Ambulatory Visit: Payer: Self-pay | Admitting: Family Medicine

## 2017-09-11 ENCOUNTER — Telehealth: Payer: Self-pay

## 2017-09-11 NOTE — Telephone Encounter (Signed)
Pt called stating that she needed to give an important message to [place], [place].

## 2017-09-13 MED ORDER — NORGESTIMATE-ETH ESTRADIOL 0.25-35 MG-MCG PO TABS: 1.0000 | ORAL_TABLET | Freq: Every day | ORAL | 11 refills | Status: DC

## 2017-09-13 MED ORDER — CYCLOBENZAPRINE HCL 5 MG PO TABS: 5.0000 mg | ORAL_TABLET | Freq: Every day | ORAL | 1 refills | Status: DC

## 2017-09-13 NOTE — Telephone Encounter (Signed)
I returned pt call and she stated that she is not able to afford the birth control pill that was prescribed ($55) and is not covered by Medicaid. She is still having a lot of pain - when she takes ibuprofen, it only works for about 45 minutes. Pt is also requesting refill of Flexeril @ bedtime as she found that to be helpful. I consulted with Dr. Gerarda Fraction and obtained refill of Flexeril as well as alternate Rx for OCP (Sprintec). I informed pt of the prescriptions sent to her pharmacy. I also advised pt to try taking Tylenol between doses of ibuprofen. Pt voiced understanding of all information and instructions given.

## 2017-09-19 ENCOUNTER — Telehealth: Payer: Self-pay | Admitting: General Practice

## 2017-09-19 DIAGNOSIS — N760 Acute vaginitis: Principal | ICD-10-CM

## 2017-09-19 DIAGNOSIS — B9689 Other specified bacterial agents as the cause of diseases classified elsewhere: Secondary | ICD-10-CM

## 2017-09-19 MED ORDER — METRONIDAZOLE 500 MG PO TABS
500.0000 mg | ORAL_TABLET | Freq: Two times a day (BID) | ORAL | 0 refills | Status: DC
Start: 2017-09-19 — End: 2018-05-06

## 2017-09-19 NOTE — Telephone Encounter (Signed)
Called patient to inform her of wet prep results & medication at pharmacy. Patient verbalized understanding to all and states she frequently gets these infections. Patient wants to know how to decrease incidence. Reviewed ways to decrease transmission with patient. Patient verbalized understanding & asked about process of getting pregnant since her tubes are tied. Told patient she would need to go through Infertility. Patient asked for phone number as she has questions for them. Provided number to patient. Patient verbalized understanding & had no questions.

## 2017-09-23 ENCOUNTER — Ambulatory Visit: Payer: Medicaid Other | Admitting: Obstetrics & Gynecology

## 2017-09-23 ENCOUNTER — Encounter: Payer: Self-pay | Admitting: General Practice

## 2017-09-23 NOTE — Progress Notes (Unsigned)
Patient no showed for appt today, can reschedule on her own per Dr Hulan Fray.

## 2017-09-26 ENCOUNTER — Ambulatory Visit: Payer: Self-pay | Admitting: Family Medicine

## 2017-10-14 NOTE — Progress Notes (Deleted)
   Subjective:   Patient ID: Dana Hughes    DOB: June 19, 1980, 37 y.o. female   MRN: [id number]  Dana Hughes is a 37 y.o. female with a history of gHTN here for   Establish Care - [place] and medications reviewed. - ***  Reproductive History - [id number] - BTL?  Review of Systems:  Per HPI.   [place], medications and smoking status reviewed.  Objective:   There were no vitals taken for this visit. Vitals and nursing note reviewed.  General: well nourished, well developed, in no acute distress with non-toxic appearance HEENT: normocephalic, atraumatic, moist mucous membranes Neck: supple, non-tender without lymphadenopathy CV: regular rate and rhythm without murmurs, rubs, or gallops, no lower extremity edema Lungs: clear to auscultation bilaterally with normal work of breathing Abdomen: soft, non-tender, non-distended, no masses or organomegaly palpable, normoactive bowel sounds Skin: warm, dry, no rashes or lesions Extremities: warm and well perfused, normal tone MSK: ROM grossly intact, strength intact, gait normal Neuro: Alert and oriented, speech normal  Assessment & Plan:   No problem-specific Assessment & Plan notes found for this encounter.  No orders of the defined types were placed in this encounter.  No orders of the defined types were placed in this encounter.   Rory Percy, DO PGY-1, [facility] 10/14/2017 9:14 PM

## 2017-10-15 ENCOUNTER — Ambulatory Visit: Payer: Self-pay | Admitting: Family Medicine

## 2017-10-22 ENCOUNTER — Encounter (HOSPITAL_COMMUNITY): Payer: Self-pay | Admitting: Emergency Medicine

## 2017-10-22 ENCOUNTER — Ambulatory Visit (HOSPITAL_COMMUNITY)
Admission: EM | Admit: 2017-10-22 | Discharge: 2017-10-22 | Disposition: A | Discharge status: Home / Self Care | Payer: Self-pay | Attending: Family Medicine | Admitting: Family Medicine

## 2017-10-22 DIAGNOSIS — M549 Dorsalgia, unspecified: Secondary | ICD-10-CM

## 2017-10-22 DIAGNOSIS — G8929 Other chronic pain: Secondary | ICD-10-CM

## 2017-10-22 DIAGNOSIS — M25512 Pain in left shoulder: Secondary | ICD-10-CM

## 2017-10-22 MED ORDER — DICLOFENAC SODIUM 75 MG PO TBEC
75.0000 mg | DELAYED_RELEASE_TABLET | Freq: Two times a day (BID) | ORAL | 0 refills | Status: DC
Start: 2017-10-22 — End: 2018-05-06

## 2017-10-22 MED ORDER — CYCLOBENZAPRINE HCL 10 MG PO TABS: 10.0000 mg | ORAL_TABLET | Freq: Every evening | ORAL | 0 refills | Status: DC | PRN

## 2017-10-22 MED ORDER — METHOCARBAMOL 500 MG PO TABS: 500.0000 mg | ORAL_TABLET | Freq: Three times a day (TID) | ORAL | 0 refills | Status: DC

## 2017-10-22 NOTE — Discharge Instructions (Addendum)
Please take diclofenac twice daily I have put in a referral for physical therapy, they will call you Please use Robaxin during the day, you may use Flexeril at bedtime.  Please follow-up with gastroenterology for your concerns about colonoscopy Please begin probiotics to try to help prevent BV recurrence.

## 2017-10-22 NOTE — ED Triage Notes (Signed)
Pt here for left shoulder pain and muscle spasms

## 2017-10-22 NOTE — ED Provider Notes (Signed)
[place]    CSN: [id number] Arrival date & time: 10/22/17  1537     History   Chief Complaint Chief Complaint  Patient presents with  . Shoulder Pain    HPI Dana Hughes is a 37 y.o. female history of hypertension, scoliosis presenting today for evaluation shoulder and back pain as well as other concerns.  Patient's main complaint is her shoulder and back pain.  Patient states that she has had issues with her shoulder over the past 3 to 4 months.  She initially thought she slept wrong on her left shoulder, she was seen here early May for evaluation, x-rays were negative.  She has been taking anti-inflammatories without relief.  Patient does a lot of heavy lifting with work, but denies any specific injury.  Notes that she is having difficulty with performing her duties at work.  She also notes that she has a history of scoliosis which often certain motions to ease her shoulder pain worsened her back pain.    Also concerned about colon cancer given that she has had family history and history of hemorrhoids.  She will frequently have rectal bleeding related to her hemorrhoids.  She is asking about preventive screening.  Patient does not have PCP  Patient also has concerns about her large breasts straining her back as well, typically will wear a sports bra and regular brought together, but this still strains her back.  Also requesting advice on prevention for BV. HPI  Past Medical History:  Diagnosis Date  . Anxiety   . Arthritis   . Asthma   . Depression   . Headache(784.0)    migraines  . Hypertension    no longer takes meds  . Muscle spasm   . Ovarian cyst   . Scoliosis     Patient Active Problem List   Diagnosis Date Noted  . Chronic hypertension during pregnancy, antepartum 08/30/2017  . Endometriosis 08/30/2017  . Pelvic pain in female 09/29/2015  . Abnormal uterine bleeding 09/29/2015    Past Surgical History:  Procedure Laterality Date  .  CHOLECYSTECTOMY    . TUBAL LIGATION      OB History    Gravida  4   Para  3   Term  3   Preterm  0   AB  1   Living  3     SAB  1   TAB  0   Ectopic  0   Multiple  0   Live Births  3        Obstetric Comments  SVD x 3         Home Medications    Prior to Admission medications   Medication Sig Start Date End Date Taking? Authorizing Provider  albuterol (PROVENTIL HFA;VENTOLIN HFA) 108 (90 Base) MCG/ACT inhaler Inhale 2 puffs into the lungs every 6 (six) hours as needed for wheezing or shortness of breath. Reported on 09/29/2015    [provider]  amLODipine (NORVASC) 5 MG tablet Take 1 tablet (5 mg total) by mouth daily. 08/30/17   Lajean Manes, CNM  cyclobenzaprine (FLEXERIL) 10 MG tablet Take 1 tablet (10 mg total) by mouth at bedtime as needed for muscle spasms. 10/22/17   Ran Tullis C, PA-C  diclofenac (VOLTAREN) 75 MG EC tablet Take 1 tablet (75 mg total) by mouth 2 (two) times daily. 10/22/17   Akeela Busk C, PA-C  diflunisal (DOLOBID) 500 MG TABS tablet Take by mouth. 10/17/15   [provider]  ibuprofen (ADVIL,MOTRIN) 800 MG tablet Take 1 tablet (800 mg total) by mouth every 8 (eight) hours as needed. 03/21/17   Shelly Bombard, MD  lisinopril (PRINIVIL,ZESTRIL) 10 MG tablet Take by mouth. 09/27/15   [provider]  methocarbamol (ROBAXIN) 500 MG tablet Take 1 tablet (500 mg total) by mouth 3 (three) times daily. 10/22/17   Gian Ybarra C, PA-C  metroNIDAZOLE (FLAGYL) 500 MG tablet Take 1 tablet (500 mg total) by mouth 2 (two) times daily. 09/19/17   Lajean Manes, CNM  naproxen (NAPROSYN) 500 MG tablet Take 1 tablet (500 mg total) by mouth 2 (two) times daily. 08/28/17   Zigmund Gottron, NP  norgestimate-ethinyl estradiol (ORTHO-CYCLEN,SPRINTEC,PREVIFEM) 0.25-35 MG-MCG tablet Take 1 tablet by mouth daily. 09/13/17   Katheren Shams, DO  triamterene-hydrochlorothiazide (DYAZIDE) 37.5-25 MG capsule Take 1 each (1  capsule total) by mouth every morning. 03/21/17   Shelly Bombard, MD    Family History Family History  Problem Relation Age of Onset  . Cancer Father   . Hypertension Maternal Aunt   . Breast cancer Maternal Aunt   . Cancer Paternal Grandmother   . Breast cancer Paternal Grandmother   . Breast cancer Paternal Aunt     Social History Social History   Tobacco Use  . Smoking status: Former Research scientist (life sciences)  . Smokeless tobacco: Never Used  Substance Use Topics  . Alcohol use: Yes    Comment: rare  . Drug use: No     Allergies   Mushroom extract complex; Latex; and Penicillins   Review of Systems Review of Systems  Constitutional: Negative for activity change, chills, diaphoresis and fatigue.  HENT: Negative for ear pain, tinnitus and trouble swallowing.   Eyes: Negative for photophobia and visual disturbance.  Respiratory: Negative for cough, chest tightness and shortness of breath.   Cardiovascular: Negative for chest pain and leg swelling.  Gastrointestinal: Positive for anal bleeding. Negative for abdominal pain, blood in stool, nausea and vomiting.  Musculoskeletal: Positive for arthralgias, back pain and myalgias. Negative for gait problem, neck pain and neck stiffness.  Skin: Negative for color change and wound.  Neurological: Negative for dizziness, weakness, light-headedness, numbness and headaches.     Physical Exam Triage Vital Signs ED Triage Vitals [10/22/17 1615]  Enc Vitals Group     BP (!) 136/95     Pulse Rate 90     Resp 18     Temp 98.6 F (37 C)     Temp Source Oral     SpO2 99 %     Weight      Height      Head Circumference      Peak Flow      Pain Score 8     Pain Loc      Pain Edu?      Excl. in [place]?    No data found.  Updated Vital Signs BP (!) 136/95 (BP Location: Left Arm)   Pulse 90   Temp 98.6 F (37 C) (Oral)   Resp 18   SpO2 99%   Visual Acuity Right Eye Distance:   Left Eye Distance:   Bilateral Distance:    Right Eye  Near:   Left Eye Near:    Bilateral Near:     Physical Exam  Constitutional: She is oriented to person, place, and time. She appears well-developed and well-nourished. No distress.  HENT:  Head: Normocephalic and atraumatic.  Eyes: Conjunctivae are normal.  Neck: Neck supple.  Cardiovascular: Normal rate and regular rhythm.  No murmur heard. Pulmonary/Chest: Effort normal and breath sounds normal. No respiratory distress.  Abdominal: Soft. There is no tenderness.  Musculoskeletal: She exhibits no edema.  Patient has limited active range of motion at shoulder, patient has full passive range of motion.  Strength 5/5 in all directions, negative liftoff, negative Hawkins or empty can. Spine appears slightly curved, nontender to palpation of spine midline.  Mild tenderness to palpation of left lateral lumbar musculature  Neurological: She is alert and oriented to person, place, and time.  Skin: Skin is warm and dry.  Psychiatric: She has a normal mood and affect.  Nursing note and vitals reviewed.    UC Treatments / Results  Labs (all labs ordered are listed, but only abnormal results are displayed) Labs Reviewed - No data to display  EKG None  Radiology No results found.  Procedures Procedures (including critical care time)  Medications Ordered in UC Medications - No data to display  Initial Impression / Assessment and Plan / UC Course  I have reviewed the triage vital signs and the nursing notes.  Pertinent labs & imaging results that were available during my care of the patient were reviewed by me and considered in my medical decision making (see chart for details).     Patient with shoulder pain, acute versus chronic.  Will recommend for physical therapy given persistence of symptoms and failure to improve with anti-inflammatories.  Also provided Robaxin to use during the day, the use Flexeril at bedtime.  Diclofenac daily.Discussed strict return precautions. Patient  verbalized understanding and is agreeable with plan.   Final Clinical Impressions(s) / UC Diagnoses   Final diagnoses:  Acute pain of left shoulder  Chronic bilateral back pain, unspecified back location     Discharge Instructions     Please take diclofenac twice daily I have put in a referral for physical therapy, they will call you Please use Robaxin during the day, you may use Flexeril at bedtime.  Please follow-up with gastroenterology for your concerns about colonoscopy Please begin probiotics to try to help prevent BV recurrence.     ED Prescriptions    Medication Sig Dispense Auth. Provider   diclofenac (VOLTAREN) 75 MG EC tablet Take 1 tablet (75 mg total) by mouth 2 (two) times daily. 40 tablet Fermina Mishkin C, PA-C   cyclobenzaprine (FLEXERIL) 10 MG tablet Take 1 tablet (10 mg total) by mouth at bedtime as needed for muscle spasms. 20 tablet Lynanne Delgreco C, PA-C   methocarbamol (ROBAXIN) 500 MG tablet Take 1 tablet (500 mg total) by mouth 3 (three) times daily. 30 tablet Alechia Lezama, Bolan C, PA-C     Controlled Substance Prescriptions  Controlled Substance Registry consulted? Not Applicable   Janith Lima, [place] 10/22/17 1712

## 2017-11-14 ENCOUNTER — Ambulatory Visit: Payer: Medicaid Other | Admitting: Physical Therapy

## 2017-12-16 ENCOUNTER — Ambulatory Visit (INDEPENDENT_AMBULATORY_CARE_PROVIDER_SITE_OTHER): Payer: Self-pay

## 2017-12-16 ENCOUNTER — Ambulatory Visit (HOSPITAL_COMMUNITY)
Admission: EM | Admit: 2017-12-16 | Discharge: 2017-12-16 | Disposition: A | Discharge status: Home / Self Care | Payer: Self-pay | Attending: Family Medicine | Admitting: Family Medicine

## 2017-12-16 ENCOUNTER — Encounter (HOSPITAL_COMMUNITY): Payer: Self-pay | Admitting: Emergency Medicine

## 2017-12-16 DIAGNOSIS — S39012A Strain of muscle, fascia and tendon of lower back, initial encounter: Secondary | ICD-10-CM

## 2017-12-16 MED ORDER — CYCLOBENZAPRINE HCL 10 MG PO TABS: 5.0000 mg | ORAL_TABLET | Freq: Every day | ORAL | 0 refills | Status: DC

## 2017-12-16 MED ORDER — KETOROLAC TROMETHAMINE 30 MG/ML IJ SOLN
INTRAMUSCULAR | Status: AC
  Filled 2017-12-16: qty 1

## 2017-12-16 MED ORDER — MELOXICAM 7.5 MG PO TABS: 7.5000 mg | ORAL_TABLET | Freq: Every day | ORAL | 0 refills | Status: DC

## 2017-12-16 MED ORDER — KETOROLAC TROMETHAMINE 30 MG/ML IJ SOLN
30.0000 mg | Freq: Once | INTRAMUSCULAR | Status: AC
  Administered 2017-12-16: 30 mg via INTRAMUSCULAR
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
## 2017-12-16 NOTE — Discharge Instructions (Addendum)
It was nice meeting you!!  Your x-ray was normal. We will give you a low-dose muscle relaxant.  Be aware that this medicine could cause drowsiness. We will also prescribe some meloxicam for pain and inflammation.  Work note given Follow up as needed for continued or worsening symptoms

## 2017-12-16 NOTE — ED Notes (Signed)
In xray

## 2018-01-30 ENCOUNTER — Telehealth: Payer: Self-pay | Admitting: Family Medicine

## 2018-01-30 NOTE — Telephone Encounter (Signed)
Pt called in regards to getting scheduled for a fu appt about her endoment having irregular periods and bad abdominal pains. Informed pt that we would have to get her scheduled in Nov. And that schedule is not out yet so if she could give [place] a call back end of next week and we could get her scheduled.

## 2018-02-14 ENCOUNTER — Emergency Department (HOSPITAL_COMMUNITY)
Admission: EM | Admit: 2018-02-14 | Discharge: 2018-02-14 | Disposition: A | Discharge status: Home / Self Care | Payer: Medicaid Other | Attending: Emergency Medicine | Admitting: Emergency Medicine

## 2018-02-14 ENCOUNTER — Encounter (HOSPITAL_COMMUNITY): Payer: Self-pay

## 2018-02-14 DIAGNOSIS — Z5321 Procedure and treatment not carried out due to patient leaving prior to being seen by health care provider: Secondary | ICD-10-CM | POA: Insufficient documentation

## 2018-02-14 DIAGNOSIS — R202 Paresthesia of skin: Secondary | ICD-10-CM | POA: Insufficient documentation

## 2018-02-14 DIAGNOSIS — R109 Unspecified abdominal pain: Secondary | ICD-10-CM | POA: Insufficient documentation

## 2018-02-14 LAB — URINALYSIS, ROUTINE W REFLEX MICROSCOPIC
Bilirubin Urine: NEGATIVE
Glucose, UA: NEGATIVE mg/dL
KETONES UR: NEGATIVE mg/dL
Leukocytes, UA: NEGATIVE
Nitrite: POSITIVE — AB
PROTEIN: NEGATIVE mg/dL
Specific Gravity, Urine: 1.02 (ref 1.005–1.030)
pH: 6 (ref 5.0–8.0)

## 2018-02-14 LAB — COMPREHENSIVE METABOLIC PANEL
ALBUMIN: 3.7 g/dL (ref 3.5–5.0)
ALT: 55 U/L — ABNORMAL HIGH (ref 0–44)
AST: 48 U/L — AB (ref 15–41)
Alkaline Phosphatase: 43 U/L (ref 38–126)
Anion gap: 8 (ref 5–15)
BILIRUBIN TOTAL: 0.3 mg/dL (ref 0.3–1.2)
CHLORIDE: 109 mmol/L (ref 98–111)
CO2: 23 mmol/L (ref 22–32)
Calcium: 9.1 mg/dL (ref 8.9–10.3)
Creatinine, Ser: 0.73 mg/dL (ref 0.44–1.00)
GFR calc Af Amer: >60 mL/min (ref >60)
GFR calc non Af Amer: >60 mL/min (ref >60)
GLUCOSE: 92 mg/dL (ref 70–99)
POTASSIUM: 4.1 mmol/L (ref 3.5–5.1)
Sodium: 140 mmol/L (ref 135–145)
TOTAL PROTEIN: 6.9 g/dL (ref 6.5–8.1)

## 2018-02-14 LAB — CBC
HEMATOCRIT: 38.8 % (ref 36.0–46.0)
HEMOGLOBIN: 11.9 g/dL — AB (ref 12.0–15.0)
MCH: 26.6 pg (ref 26.0–34.0)
MCHC: 30.7 g/dL (ref 30.0–36.0)
MCV: 86.8 fL (ref 80.0–100.0)
Platelets: 403 10*3/uL — ABNORMAL HIGH (ref 150–400)
RBC: 4.47 MIL/uL (ref 3.87–5.11)
RDW: 14.8 % (ref 11.5–15.5)
WBC: 8.5 10*3/uL (ref 4.0–10.5)
nRBC: 0 % (ref 0.0–0.2)

## 2018-02-14 LAB — I-STAT BETA HCG BLOOD, ED (MC, WL, AP ONLY)

## 2018-02-14 LAB — LIPASE, BLOOD: LIPASE: 31 U/L (ref 11–51)

## 2018-02-14 NOTE — ED Notes (Signed)
Pt stating her babysitter has to leave so she has no one to watch her kids and has to go home.

## 2018-02-14 NOTE — ED Triage Notes (Signed)
Pt presents for left sided abd pain intermittently x 2 weeks. Pt reports both arms feeling numb/tingling but has carpel tunnel in both hands.

## 2018-02-19 ENCOUNTER — Other Ambulatory Visit (HOSPITAL_COMMUNITY)
Admission: RE | Admit: 2018-02-19 | Discharge: 2018-02-19 | Disposition: A | Discharge status: Home / Self Care | Payer: Medicaid Other | Source: Ambulatory Visit | Attending: Obstetrics and Gynecology | Admitting: Obstetrics and Gynecology

## 2018-02-19 ENCOUNTER — Ambulatory Visit (INDEPENDENT_AMBULATORY_CARE_PROVIDER_SITE_OTHER): Payer: Self-pay | Admitting: Obstetrics and Gynecology

## 2018-02-19 ENCOUNTER — Encounter: Payer: Self-pay | Admitting: Obstetrics and Gynecology

## 2018-02-19 VITALS — BP 132/101 | HR 76 | Ht 62.0 in | Wt 218.0 lb

## 2018-02-19 DIAGNOSIS — R350 Frequency of micturition: Secondary | ICD-10-CM | POA: Insufficient documentation

## 2018-02-19 DIAGNOSIS — N898 Other specified noninflammatory disorders of vagina: Secondary | ICD-10-CM | POA: Insufficient documentation

## 2018-02-19 DIAGNOSIS — N3000 Acute cystitis without hematuria: Secondary | ICD-10-CM

## 2018-02-19 DIAGNOSIS — O10919 Unspecified pre-existing hypertension complicating pregnancy, unspecified trimester: Secondary | ICD-10-CM

## 2018-02-19 DIAGNOSIS — Z3189 Encounter for other procreative management: Secondary | ICD-10-CM

## 2018-02-19 DIAGNOSIS — I10 Essential (primary) hypertension: Secondary | ICD-10-CM

## 2018-02-19 DIAGNOSIS — R102 Pelvic and perineal pain: Secondary | ICD-10-CM

## 2018-02-19 MED ORDER — NITROFURANTOIN MONOHYD MACRO 100 MG PO CAPS: 100.0000 mg | ORAL_CAPSULE | Freq: Two times a day (BID) | ORAL | 1 refills | Status: DC

## 2018-02-19 MED ORDER — AMLODIPINE BESYLATE 5 MG PO TABS: 5.0000 mg | ORAL_TABLET | Freq: Every day | ORAL | 1 refills | Status: DC

## 2018-02-19 NOTE — Progress Notes (Signed)
GYNECOLOGY OFFICE VISIT NOTE  History:  37 y.o. [id number] here today for checkup and reports ongoing BV and yeast infection. She reports she has monthly BV, yeast and bladder infections which she treats with OTC remedies. She wants to know what she can do about that. She also has pelvic pain and is wanting to know what she can do about her endometriosis. She started taking CHC for her endometriosis but stopped that over a month ago because she doesn't want to continue taking it as it did not improve her pain. She also reports pain with intercourse and wants to know what else can be done.   Also complains of nausea and emesis. She also has hypertension which she does not currently take meds for. She is requesting refill on meds, states she has refills of one at the pharmacy but has not been to pick it up. She does not currently have a primary care doctor.   Agreeable to STI screen.  She also reports wanting a tubal reversal.  Past Medical History:  Diagnosis Date  . Anxiety   . Arthritis   . Asthma   . BV (bacterial vaginosis)   . Candidiasis   . Depression   . Headache(784.0)    migraines  . Hypertension    no longer takes meds  . Muscle spasm   . Ovarian cyst   . Scoliosis    Past Surgical History:  Procedure Laterality Date  . CHOLECYSTECTOMY    . TUBAL LIGATION      Current Outpatient Medications:  .  albuterol (PROVENTIL HFA;VENTOLIN HFA) 108 (90 Base) MCG/ACT inhaler, Inhale 2 puffs into the lungs every 6 (six) hours as needed for wheezing or shortness of breath. Reported on 09/29/2015, Disp: , Rfl:  .  cyclobenzaprine (FLEXERIL) 10 MG tablet, Take 0.5 tablets (5 mg total) by mouth at bedtime., Disp: 20 tablet, Rfl: 0 .  lisinopril (PRINIVIL,ZESTRIL) 10 MG tablet, Take by mouth., Disp: , Rfl:  .  amLODipine (NORVASC) 5 MG tablet, Take 1 tablet (5 mg total) by mouth daily., Disp: 30 tablet, Rfl: 1 .  cyclobenzaprine (FLEXERIL) 10 MG tablet, Take 1 tablet (10 mg total) by  mouth at bedtime as needed for muscle spasms., Disp: 20 tablet, Rfl: 0 .  diclofenac (VOLTAREN) 75 MG EC tablet, Take 1 tablet (75 mg total) by mouth 2 (two) times daily., Disp: 40 tablet, Rfl: 0 .  diflunisal (DOLOBID) 500 MG TABS tablet, Take by mouth., Disp: , Rfl:  .  ibuprofen (ADVIL,MOTRIN) 800 MG tablet, Take 1 tablet (800 mg total) by mouth every 8 (eight) hours as needed., Disp: 30 tablet, Rfl: 5 .  meloxicam (MOBIC) 7.5 MG tablet, Take 1 tablet (7.5 mg total) by mouth daily., Disp: 30 tablet, Rfl: 0 .  methocarbamol (ROBAXIN) 500 MG tablet, Take 1 tablet (500 mg total) by mouth 3 (three) times daily., Disp: 30 tablet, Rfl: 0 .  metroNIDAZOLE (FLAGYL) 500 MG tablet, Take 1 tablet (500 mg total) by mouth 2 (two) times daily., Disp: 14 tablet, Rfl: 0 .  naproxen (NAPROSYN) 500 MG tablet, Take 1 tablet (500 mg total) by mouth 2 (two) times daily. (Patient not taking: Reported on 02/19/2018), Disp: 30 tablet, Rfl: 0 .  nitrofurantoin, macrocrystal-monohydrate, (MACROBID) 100 MG capsule, Take 1 capsule (100 mg total) by mouth 2 (two) times daily., Disp: 14 capsule, Rfl: 1 .  triamterene-hydrochlorothiazide (DYAZIDE) 37.5-25 MG capsule, Take 1 each (1 capsule total) by mouth every morning. (Patient not taking: Reported on 02/19/2018),  Disp: 30 capsule, Rfl: 11  The following portions of the patient's history were reviewed and updated as appropriate: allergies, current medications, past family history, past medical history, past social history, past surgical history and problem list.   Health Maintenance:  Last pap: negative 02/2017 Last mammogram: n/a  Review of Systems:  Pertinent items noted in HPI and remainder of comprehensive ROS otherwise negative.   Objective:  Physical Exam BP (!) 132/101   Pulse 76   Ht 5\' 2"  (1.575 m)   Wt 218 lb (98.9 kg)   LMP 01/23/2018   BMI 39.87 kg/m  CONSTITUTIONAL: Well-developed, well-nourished female in no acute distress.  HENT:  Normocephalic,  atraumatic. External right and left ear normal. Oropharynx is clear and moist EYES: Conjunctivae and EOM are normal. Pupils are equal, round, and reactive to light. No scleral icterus.  NECK: Normal range of motion, supple, no masses SKIN: Skin is warm and dry. No rash noted. Not diaphoretic. No erythema. No pallor. NEUROLOGIC: Alert and oriented to person, place, and time. Normal reflexes, muscle tone coordination. No cranial nerve deficit noted. PSYCHIATRIC: Normal mood and affect. Normal behavior. Normal judgment and thought content. CARDIOVASCULAR: Normal heart rate noted RESPIRATORY: Effort normal, no problems with respiration noted ABDOMEN: Soft, no distention noted.   PELVIC: Normal appearing external genitalia; normal appearing vaginal mucosa and cervix.  No abnormal discharge noted, dark red blood in vault.  pelvic cultures obtained.  MUSCULOSKELETAL: Normal range of motion. No edema noted.  Labs and Imaging No results found.  Assessment & Plan:   1. Vaginal irritation Reports frequent vaginal infections but unclear if BV vs yeast vs UTI as she uses multiple OTC remedies. Reviewed importance of presenting for evaluation so that we can delineate what is causing her symptoms. She will return as needed, briefly reviewed suppressive medication and that may be necessary once diagnosis established. Reviewed importance of vulvar hygiene. - Cervicovaginal ancillary only  2. Urinary frequency - Urine Culture  3. Chronic hypertension - reviewed that OB/GYN does not manage chronic HTN long term, will give her refill so she is stable until appt can be made at primary care, but no further refills will be given - Ambulatory referral to Family Practice - norvasc refill sent to pharmacy  4. Acute cystitis without hematuria macrobid sent to pharmacy Urine culture sent today, will change abx pending sensitivities  6. Encounter for fertility planning Briefly reviewed that she would need to  see REI for tubal reversal, but this is expensive and with no guarantee of success Gave info for Dr. Kerin Perna  7. Pelvic pain Briefly reviewed options for endometriosis, recommended return for full discussion    Routine preventative health maintenance measures emphasized. Please refer to After Visit Summary for other counseling recommendations.   Return in about 2 weeks (around 03/05/2018).   Feliz Beam, M.D. [facility]

## 2018-02-19 NOTE — Patient Instructions (Addendum)
Reviewed vulvar hygiene. Reviewed that she should wear cotton brief underwear and refrain from tight-fitting undergarments including avoiding underwear made of synthetic fabrics. Reviewed importance of controlling moisture in groin area and keeping area dry. Reviewed importance of using non-irritating soaps and laundry detergents. Reviewed using as few products in vulvar area as possible as even products designed to improve irritation can be problematic. Reviewed importance of avoiding douches, lotions or vaginal sprays as these are very irritating. Reviewed importance of avoiding scratching as much as possible as this can irritate vulvar skin.     Bacterial Vaginosis Bacterial vaginosis is an infection of the vagina. It happens when too many germs (bacteria) grow in the vagina. This infection puts you at risk for infections from sex (STIs). Treating this infection can lower your risk for some STIs. You should also treat this if you are pregnant. It can cause your baby to be born early. Follow these instructions at home: Medicines  Take over-the-counter and prescription medicines only as told by your doctor.  Take or use your antibiotic medicine as told by your doctor. Do not stop taking or using it even if you start to feel better. General instructions  If you your sexual partner is a woman, tell her that you have this infection. She needs to get treatment if she has symptoms. If you have a female partner, he does not need to be treated.  During treatment: ? Avoid sex. ? Do not douche. ? Avoid alcohol as told. ? Avoid breastfeeding as told.  Drink enough fluid to keep your pee (urine) clear or pale yellow.  Keep your vagina and butt (rectum) clean. ? Wash the area with warm water every day. ? Wipe from front to back after you use the toilet.  Keep all follow-up visits as told by your doctor. This is important. Preventing this condition  Do not douche.  Use only warm water to wash  around your vagina.  Use protection when you have sex. This includes: ? Latex condoms. ? Dental dams.  Limit how many people you have sex with. It is best to only have sex with the same person (be monogamous).  Get tested for STIs. Have your partner get tested.  Wear underwear that is cotton or lined with cotton.  Avoid tight pants and pantyhose. This is most important in summer.  Do not use any products that have nicotine or tobacco in them. These include cigarettes and e-cigarettes. If you need help quitting, ask your doctor.  Do not use illegal drugs.  Limit how much alcohol you drink. Contact a doctor if:  Your symptoms do not get better, even after you are treated.  You have more discharge or pain when you pee (urinate).  You have a fever.  You have pain in your belly (abdomen).  You have pain with sex.  Your bleed from your vagina between periods. Summary  This infection happens when too many germs (bacteria) grow in the vagina.  Treating this condition can lower your risk for some infections from sex (STIs).  You should also treat this if you are pregnant. It can cause early (premature) birth.  Do not stop taking or using your antibiotic medicine even if you start to feel better. This information is not intended to replace advice given to you by your health care provider. Make sure you discuss any questions you have with your health care provider. Document Released: 01/17/2008 Document Revised: 12/24/2015 Document Reviewed: 12/24/2015 Elsevier Interactive Patient Education  2017  Elsevier Inc.    Call Dr. Charlett Lango office to make appointment for fertility testing/counseling/treatment: [phone].

## 2018-02-19 NOTE — Progress Notes (Signed)
Here for c/o vaginal pain when she lies down to go to bed everynight and with intercourse. Also c/o cramps and having issues with urinating often. States has been told does not have UTI. Also c/o chronic BV and yeast. States stopped taking birth control pill about one month ago. States is out of both of her bp medicines. Does not have PCP. States today felt lightheaded, dizziness, like going to faint. Denies headache.

## 2018-02-20 LAB — CERVICOVAGINAL ANCILLARY ONLY
BACTERIAL VAGINITIS: POSITIVE — AB
CANDIDA VAGINITIS: NEGATIVE
CHLAMYDIA, DNA PROBE: NEGATIVE
Neisseria Gonorrhea: NEGATIVE
Trichomonas: NEGATIVE

## 2018-02-21 LAB — URINE CULTURE

## 2018-02-21 MED ORDER — METRONIDAZOLE 500 MG PO TABS: 500.0000 mg | ORAL_TABLET | Freq: Two times a day (BID) | ORAL | 0 refills | Status: DC

## 2018-02-21 NOTE — Addendum Note (Signed)
Addended by: Vivien Rota on: 02/21/2018 08:55 AM   Modules accepted: Orders

## 2018-02-24 ENCOUNTER — Telehealth: Payer: Self-pay | Admitting: *Deleted

## 2018-02-24 NOTE — Telephone Encounter (Signed)
Called pt to inform her that she was positive for BV and that Dr. Rosana Hoes sent a prescription for flagyl to the [facility] on [facility] for her.  Pt verbalized understanding. Pt requested management of her blood pressure medication but was informed that she needs to go to her primary care provider to manage her blood pressure.  Pt verbalized understanding.

## 2018-02-24 NOTE — Telephone Encounter (Signed)
-----   Message from Sloan Leiter, MD sent at 02/21/2018  8:55 AM EDT ----- Please call and let patient know about +BV, I sent flagyl to pharmacy. Please remind her to make appt next time she has vaginal irritation.

## 2018-03-06 ENCOUNTER — Telehealth: Payer: Self-pay | Admitting: Family Medicine

## 2018-03-06 ENCOUNTER — Telehealth: Payer: Self-pay | Admitting: Obstetrics & Gynecology

## 2018-03-06 NOTE — Telephone Encounter (Signed)
Pt called stating that she is having a hard time getting her UTI medications.

## 2018-03-06 NOTE — Telephone Encounter (Signed)
Called patient to inform her we had to reschedule her appointment because Dr Hulan Fray had emergency surgery.

## 2018-03-11 ENCOUNTER — Telehealth: Payer: Self-pay

## 2018-03-11 NOTE — Telephone Encounter (Signed)
LVM to pt stating if she still needed anything from [place] to give [place] a call back.

## 2018-03-12 ENCOUNTER — Ambulatory Visit: Payer: Medicaid Other | Admitting: Obstetrics and Gynecology

## 2018-03-24 ENCOUNTER — Ambulatory Visit: Payer: Medicaid Other | Admitting: Obstetrics & Gynecology

## 2018-03-24 ENCOUNTER — Other Ambulatory Visit: Payer: Self-pay

## 2018-03-24 ENCOUNTER — Encounter (HOSPITAL_COMMUNITY): Payer: Self-pay | Admitting: *Deleted

## 2018-03-24 ENCOUNTER — Emergency Department (HOSPITAL_COMMUNITY)
Admission: EM | Admit: 2018-03-24 | Discharge: 2018-03-25 | Discharge status: Against Medical Advice | Payer: Medicaid Other | Attending: Emergency Medicine | Admitting: Emergency Medicine

## 2018-03-24 DIAGNOSIS — Z5321 Procedure and treatment not carried out due to patient leaving prior to being seen by health care provider: Secondary | ICD-10-CM | POA: Insufficient documentation

## 2018-03-24 LAB — CBC
HCT: 38.2 % (ref 36.0–46.0)
Hemoglobin: 11.6 g/dL — ABNORMAL LOW (ref 12.0–15.0)
MCH: 26.7 pg (ref 26.0–34.0)
MCHC: 30.4 g/dL (ref 30.0–36.0)
MCV: 88 fL (ref 80.0–100.0)
NRBC: 0 % (ref 0.0–0.2)
Platelets: 415 10*3/uL — ABNORMAL HIGH (ref 150–400)
RBC: 4.34 MIL/uL (ref 3.87–5.11)
RDW: 14.7 % (ref 11.5–15.5)
WBC: 11 10*3/uL — ABNORMAL HIGH (ref 4.0–10.5)

## 2018-03-24 LAB — BASIC METABOLIC PANEL
ANION GAP: 8 (ref 5–15)
BUN: 8 mg/dL (ref 6–20)
CO2: 25 mmol/L (ref 22–32)
CREATININE: 0.98 mg/dL (ref 0.44–1.00)
Calcium: 9 mg/dL (ref 8.9–10.3)
Chloride: 105 mmol/L (ref 98–111)
GFR calc Af Amer: >60 mL/min (ref >60)
GFR calc non Af Amer: >60 mL/min (ref >60)
Glucose, Bld: 92 mg/dL (ref 70–99)
Potassium: 3.4 mmol/L — ABNORMAL LOW (ref 3.5–5.1)
Sodium: 138 mmol/L (ref 135–145)

## 2018-03-24 LAB — URINALYSIS, ROUTINE W REFLEX MICROSCOPIC
Bilirubin Urine: NEGATIVE
GLUCOSE, UA: NEGATIVE mg/dL
Hgb urine dipstick: NEGATIVE
Ketones, ur: NEGATIVE mg/dL
Leukocytes, UA: NEGATIVE
Nitrite: NEGATIVE
Protein, ur: NEGATIVE mg/dL
Specific Gravity, Urine: 1.024 (ref 1.005–1.030)
pH: 5 (ref 5.0–8.0)

## 2018-03-24 LAB — I-STAT BETA HCG BLOOD, ED (MC, WL, AP ONLY): I-stat hCG, quantitative: <5 m[IU]/mL (ref <5)

## 2018-03-24 MED ORDER — IBUPROFEN 400 MG PO TABS
400.0000 mg | ORAL_TABLET | Freq: Once | ORAL | Status: AC | PRN
  Administered 2018-03-24: 400 mg via ORAL
  Filled 2018-03-24: qty 1

## 2018-03-24 NOTE — ED Triage Notes (Signed)
Pt reports hx of htn, has been taking meds as prescribed. Had recent near syncopal episodes with headache. No acute distress is noted at this time.

## 2018-03-24 NOTE — ED Triage Notes (Signed)
Pt arrives via EMS with c/o migraine headache since last Wednesday. Has had nausea and dizziness. Has not taken her BP meds since Saturday. 154/82, hr 84, 18 resp, cbg 129.

## 2018-03-24 NOTE — Progress Notes (Deleted)
Per Dr. Hulan Fray, do not need to call pt

## 2018-03-25 NOTE — ED Notes (Signed)
Pt stated to staff that they were leaving but Pt and family still in waiting room at this time.

## 2018-03-25 NOTE — ED Notes (Signed)
Pt stated she can not wait any longer. Pt has children at home.

## 2018-03-25 NOTE — ED Notes (Signed)
No answer in waiting area x 3  

## 2018-04-22 ENCOUNTER — Ambulatory Visit: Payer: Medicaid Other | Admitting: Obstetrics & Gynecology

## 2018-05-02 ENCOUNTER — Emergency Department (HOSPITAL_COMMUNITY)
Admission: EM | Admit: 2018-05-02 | Discharge: 2018-05-02 | Disposition: A | Discharge status: Home / Self Care | Payer: Medicaid Other | Attending: Emergency Medicine | Admitting: Emergency Medicine

## 2018-05-02 ENCOUNTER — Encounter (HOSPITAL_COMMUNITY): Payer: Self-pay | Admitting: Emergency Medicine

## 2018-05-02 ENCOUNTER — Other Ambulatory Visit: Payer: Self-pay

## 2018-05-02 DIAGNOSIS — R42 Dizziness and giddiness: Secondary | ICD-10-CM

## 2018-05-02 DIAGNOSIS — H5789 Other specified disorders of eye and adnexa: Secondary | ICD-10-CM | POA: Insufficient documentation

## 2018-05-02 DIAGNOSIS — Z87891 Personal history of nicotine dependence: Secondary | ICD-10-CM | POA: Insufficient documentation

## 2018-05-02 DIAGNOSIS — I10 Essential (primary) hypertension: Secondary | ICD-10-CM | POA: Insufficient documentation

## 2018-05-02 DIAGNOSIS — J45909 Unspecified asthma, uncomplicated: Secondary | ICD-10-CM | POA: Insufficient documentation

## 2018-05-02 DIAGNOSIS — Z91040 Latex allergy status: Secondary | ICD-10-CM | POA: Insufficient documentation

## 2018-05-02 DIAGNOSIS — Z79899 Other long term (current) drug therapy: Secondary | ICD-10-CM | POA: Insufficient documentation

## 2018-05-02 LAB — BASIC METABOLIC PANEL
Anion gap: 8 (ref 5–15)
BUN: 7 mg/dL (ref 6–20)
CO2: 27 mmol/L (ref 22–32)
Calcium: 9.5 mg/dL (ref 8.9–10.3)
Chloride: 106 mmol/L (ref 98–111)
Creatinine, Ser: 0.95 mg/dL (ref 0.44–1.00)
GFR calc Af Amer: >60 mL/min (ref >60)
GFR calc non Af Amer: >60 mL/min (ref >60)
GLUCOSE: 81 mg/dL (ref 70–99)
Potassium: 3.5 mmol/L (ref 3.5–5.1)
Sodium: 141 mmol/L (ref 135–145)

## 2018-05-02 LAB — CBC
HCT: 39.8 % (ref 36.0–46.0)
HEMOGLOBIN: 12.4 g/dL (ref 12.0–15.0)
MCH: 27.9 pg (ref 26.0–34.0)
MCHC: 31.2 g/dL (ref 30.0–36.0)
MCV: 89.4 fL (ref 80.0–100.0)
Platelets: 390 10*3/uL (ref 150–400)
RBC: 4.45 MIL/uL (ref 3.87–5.11)
RDW: 14.3 % (ref 11.5–15.5)
WBC: 9.7 10*3/uL (ref 4.0–10.5)
nRBC: 0 % (ref 0.0–0.2)

## 2018-05-02 LAB — URINALYSIS, ROUTINE W REFLEX MICROSCOPIC
Bilirubin Urine: NEGATIVE
Glucose, UA: NEGATIVE mg/dL
Hgb urine dipstick: NEGATIVE
Ketones, ur: NEGATIVE mg/dL
Leukocytes, UA: NEGATIVE
Nitrite: NEGATIVE
Protein, ur: NEGATIVE mg/dL
SPECIFIC GRAVITY, URINE: 1.021 (ref 1.005–1.030)
pH: 5 (ref 5.0–8.0)

## 2018-05-02 LAB — CBG MONITORING, ED: GLUCOSE-CAPILLARY: 75 mg/dL (ref 70–99)

## 2018-05-02 LAB — PREGNANCY, URINE: Preg Test, Ur: NEGATIVE

## 2018-05-02 MED ORDER — ARTIFICIAL TEARS OPHTHALMIC OINT: TOPICAL_OINTMENT | Freq: Every evening | OPHTHALMIC | 0 refills | Status: DC | PRN

## 2018-05-02 NOTE — ED Triage Notes (Signed)
Reports feeling dizzy since last Friday.  States that this has since gotten worse and she is supposed to wear glasses but cannot afford them. States the past 2 years vision has become progressively worse due to not wearing glasses. Ambulatory to triage in NAD.  Alert and oriented x 4.

## 2018-05-02 NOTE — ED Provider Notes (Signed)
[facility] DEPT Provider Note   CSN: [id number] Arrival date & time: 05/02/18  1549     History   Chief Complaint Chief Complaint  Patient presents with  . Dizziness    HPI Dana Hughes is a 38 y.o. female.  HPI   Patient is a 38 year old female with a history of hypertension, no longer on antihypertensives, headaches, depression, asthma presenting for intermittent "dizziness" and eye watering.  Patient reports that she has had intermittent "dizziness" for the past 6 months, described as a "swimmy headed" sensation.  Patient denies vertigo.  She reports that it got worse over the past 2 weeks.  She feels that it is induced by a "burning" sensation in her eyes.  She denies any erythematous conjunctiva or drainage from her eyes.  She denies any vision disturbance.  Patient reports that there is no specific trigger for her symptoms.  She denies an increase in her symptoms when she stands up quickly.  Patient denies any nausea, vomiting, diarrhea.  She denies any heavy menstrual bleeding.  Patient with any chest pain, shortness of breath.   Past Medical History:  Diagnosis Date  . Anxiety   . Arthritis   . Asthma   . BV (bacterial vaginosis)   . Candidiasis   . Depression   . Headache(784.0)    migraines  . Hypertension    no longer takes meds  . Muscle spasm   . Ovarian cyst   . Scoliosis     Patient Active Problem List   Diagnosis Date Noted  . Chronic hypertension during pregnancy, antepartum 08/30/2017  . Endometriosis 08/30/2017  . Pelvic pain in female 09/29/2015  . Abnormal uterine bleeding 09/29/2015    Past Surgical History:  Procedure Laterality Date  . CHOLECYSTECTOMY    . TUBAL LIGATION       OB History    Gravida  4   Para  3   Term  3   Preterm  0   AB  1   Living  3     SAB  1   TAB  0   Ectopic  0   Multiple  0   Live Births  3        Obstetric Comments  SVD x 3         Home  Medications    Prior to Admission medications   Medication Sig Start Date End Date Taking? Authorizing Provider  albuterol (PROVENTIL HFA;VENTOLIN HFA) 108 (90 Base) MCG/ACT inhaler Inhale 2 puffs into the lungs every 6 (six) hours as needed for wheezing or shortness of breath. Reported on 09/29/2015   Yes [provider]  amLODipine (NORVASC) 5 MG tablet Take 1 tablet (5 mg total) by mouth daily. 02/19/18  Yes Sloan Leiter, MD  cyclobenzaprine (FLEXERIL) 10 MG tablet Take 1 tablet (10 mg total) by mouth at bedtime as needed for muscle spasms. Patient not taking: Reported on 05/02/2018 10/22/17   Wieters, Hallie C, PA-C  cyclobenzaprine (FLEXERIL) 10 MG tablet Take 0.5 tablets (5 mg total) by mouth at bedtime. Patient not taking: Reported on 05/02/2018 12/16/17   Loura Halt A, NP  diclofenac (VOLTAREN) 75 MG EC tablet Take 1 tablet (75 mg total) by mouth 2 (two) times daily. Patient not taking: Reported on 05/02/2018 10/22/17   Wieters, Hallie C, PA-C  ibuprofen (ADVIL,MOTRIN) 800 MG tablet Take 1 tablet (800 mg total) by mouth every 8 (eight) hours as needed. Patient not taking: Reported on  05/02/2018 03/21/17   Shelly Bombard, MD  meloxicam (MOBIC) 7.5 MG tablet Take 1 tablet (7.5 mg total) by mouth daily. Patient not taking: Reported on 05/02/2018 12/16/17   Loura Halt A, NP  methocarbamol (ROBAXIN) 500 MG tablet Take 1 tablet (500 mg total) by mouth 3 (three) times daily. Patient not taking: Reported on 05/02/2018 10/22/17   Wieters, Hallie C, PA-C  metroNIDAZOLE (FLAGYL) 500 MG tablet Take 1 tablet (500 mg total) by mouth 2 (two) times daily. Patient not taking: Reported on 05/02/2018 09/19/17   Lajean Manes, CNM  metroNIDAZOLE (FLAGYL) 500 MG tablet Take 1 tablet (500 mg total) by mouth 2 (two) times daily. Patient not taking: Reported on 05/02/2018 02/21/18   Sloan Leiter, MD  naproxen (NAPROSYN) 500 MG tablet Take 1 tablet (500 mg total) by mouth 2 (two) times daily. Patient not  taking: Reported on 02/19/2018 08/28/17   Zigmund Gottron, NP  nitrofurantoin, macrocrystal-monohydrate, (MACROBID) 100 MG capsule Take 1 capsule (100 mg total) by mouth 2 (two) times daily. Patient not taking: Reported on 05/02/2018 02/19/18   Sloan Leiter, MD  triamterene-hydrochlorothiazide (DYAZIDE) 37.5-25 MG capsule Take 1 each (1 capsule total) by mouth every morning. Patient not taking: Reported on 02/19/2018 03/21/17   Shelly Bombard, MD    Family History Family History  Problem Relation Age of Onset  . Cancer Father   . Hypertension Maternal Aunt   . Breast cancer Maternal Aunt   . Cancer Paternal Grandmother   . Breast cancer Paternal Grandmother   . Breast cancer Paternal Aunt     Social History Social History   Tobacco Use  . Smoking status: Former Research scientist (life sciences)  . Smokeless tobacco: Never Used  Substance Use Topics  . Alcohol use: Yes    Comment: rare  . Drug use: Yes    Frequency: 1.0 times per week    Types: Marijuana     Allergies   Mushroom extract complex; Latex; and Penicillins   Review of Systems Review of Systems  Constitutional: Negative for chills and fever.  HENT: Negative for congestion, rhinorrhea, sinus pain and sore throat.   Eyes: Positive for itching. Negative for visual disturbance.  Respiratory: Negative for cough, chest tightness and shortness of breath.   Cardiovascular: Negative for chest pain.  Gastrointestinal: Negative for abdominal pain, diarrhea, nausea and vomiting.  Genitourinary: Negative for dysuria and flank pain.  Musculoskeletal: Negative for back pain and myalgias.  Skin: Negative for rash.  Neurological: Positive for light-headedness. Negative for dizziness, syncope and headaches.     Physical Exam Updated Vital Signs BP 128/81 (BP Location: Left Arm)   Pulse 98   Temp 98.4 F (36.9 C) (Oral)   Resp 15   Ht 5\' 3"  (1.6 m)   Wt 96.6 kg   LMP 03/29/2018 (Approximate)   SpO2 100%   BMI 37.73 kg/m   Physical  Exam Vitals signs and nursing note reviewed.  Constitutional:      General: She is not in acute distress.    Appearance: She is well-developed.  HENT:     Head: Normocephalic and atraumatic.  Eyes:     General:        Right eye: No discharge.        Left eye: No discharge.     Extraocular Movements: Extraocular movements intact.     Conjunctiva/sclera: Conjunctivae normal.     Pupils: Pupils are equal, round, and reactive to light.     Comments: No  conjunctival erythema or injection.  Neck:     Musculoskeletal: Normal range of motion and neck supple.  Cardiovascular:     Rate and Rhythm: Normal rate and regular rhythm.     Pulses:          Radial pulses are 2+ on the right side and 2+ on the left side.       Dorsalis pedis pulses are 2+ on the right side and 2+ on the left side.     Heart sounds: S1 normal and S2 normal. No murmur.  Pulmonary:     Effort: Pulmonary effort is normal.     Breath sounds: Normal breath sounds. No wheezing or rales.  Abdominal:     General: There is no distension.     Palpations: Abdomen is soft.     Tenderness: There is no abdominal tenderness. There is no guarding.  Musculoskeletal: Normal range of motion.        General: No deformity.     Right lower leg: No edema.     Left lower leg: No edema.  Lymphadenopathy:     Cervical: No cervical adenopathy.  Skin:    General: Skin is warm and dry.     Findings: No erythema or rash.  Neurological:     Mental Status: She is alert.     Comments: Cranial nerves grossly intact. Patient moves extremities symmetrically and with good coordination. Strength 5 out of 5 in upper and lower extremities. Normal and symmetric gait.  Psychiatric:        Behavior: Behavior normal.        Thought Content: Thought content normal.        Judgment: Judgment normal.      ED Treatments / Results  Labs (all labs ordered are listed, but only abnormal results are displayed) Labs Reviewed  BASIC METABOLIC PANEL    CBC  URINALYSIS, ROUTINE W REFLEX MICROSCOPIC  PREGNANCY, URINE  CBG MONITORING, ED    EKG EKG Interpretation  Date/Time:  Friday May 02 2018 19:43:56 EST Ventricular Rate:  75 PR Interval:    QRS Duration: 92 QT Interval:  389 QTC Calculation: 435 R Axis:   -28 Text Interpretation:  Sinus rhythm Borderline left axis deviation Borderline T wave abnormalities When compared with ECG of 03/24/2018 No significant change was found Confirmed by Francine Graven [phone]) on 05/02/2018 7:50:40 PM   Radiology No results found.  Procedures Procedures (including critical care time)  Medications Ordered in ED Medications - No data to display   Initial Impression / Assessment and Plan / ED Course  I have reviewed the triage vital signs and the nursing notes.  Pertinent labs & imaging results that were available during my care of the patient were reviewed by me and considered in my medical decision making (see chart for details).     Patient nontoxic-appearing, afebrile, hemodynamically stable and in no acute distress.  Patient with history of episodic lightheadedness.  Patient is unable to identify triggers.  Patient has not had any syncopal episodes.  Not associated with palpitations.  Do not have acute concerns for cardiac cause of patient's symptoms.  I feel that patient's ophthalmic symptoms are unrelated, likely related to dry eye.  There are no objective findings on exam.  No conjunctival erythema or drainage.  Patient is in no acute distress in emergency department and cannot reproduce symptoms.  Patient has entirely normal labs with no evidence of anemia, electrolyte disturbance, and is not pregnant.  EKG without  evidence of ischemia, infarction, or arrhythmia.  Will have patient follow-up with primary care.  Patient to return precautions for any presyncope, syncope, palpitations, chest pain or shortness of breath.  Patient is in understanding and agrees with plan of  care.  Final Clinical Impressions(s) / ED Diagnoses   Final diagnoses:  Episodic lightheadedness  Eye irritation    ED Discharge Orders         Ordered    artificial tears (LACRILUBE) OINT ophthalmic ointment  At bedtime PRN     05/02/18 1948           Albesa Seen, PA-C 05/02/18 [place], [place], DO 05/04/18 1651

## 2018-05-02 NOTE — Discharge Instructions (Signed)
Please see the information and instructions below regarding your visit.  Your diagnoses today include:  1. Episodic lightheadedness   2. Eye irritation    There are many causes of your symptoms of lightheadedness.  He will likely need further work-up as an outpatient.  We need to get you into a primary care provider.  Case management will reach out to you regarding making an appointment with the [facility] health clinics.  Please contact the clinics above including to the wellness center to make a financial counseling appointment.  Tests performed today include: See side panel of your discharge paperwork for testing performed today. Vital signs are listed at the bottom of these instructions.   All of your lab work and your EKG is normal today.  Medications prescribed:    Take any prescribed medications only as prescribed, and any over the counter medications only as directed on the packaging.  Please start taking the artificial tears, Lacri-Lube in a ribbon under your lower eyelid at night.   Home care instructions:  Please follow any educational materials contained in this packet.   Follow-up instructions: Please follow-up with your primary care provider as soon as possible for further evaluation of your symptoms if they are not completely improved.   Return instructions:  Please return to the Emergency Department if you experience worsening symptoms.  Please return to the emergency department if you develop any passing out spells, chest pain, shortness of breath, or feeling that her heart is racing. Please return if you have any other emergent concerns.  Additional Information:   Your vital signs today were: BP 128/81 (BP Location: Left Arm)    Pulse 98    Temp 98.4 F (36.9 C) (Oral)    Resp 15    Ht 5\' 3"  (1.6 m)    Wt 96.6 kg    LMP 03/29/2018 (Approximate)    SpO2 100%    BMI 37.73 kg/m  If your blood pressure (BP) was elevated on multiple readings during this visit above 130 for  the top number or above 80 for the bottom number, please have this repeated by your primary care provider within one month. --------------  Thank you for allowing [place] to participate in your care today.

## 2018-05-06 ENCOUNTER — Encounter (HOSPITAL_COMMUNITY): Payer: Self-pay

## 2018-05-06 ENCOUNTER — Ambulatory Visit (HOSPITAL_COMMUNITY)
Admission: EM | Admit: 2018-05-06 | Discharge: 2018-05-06 | Disposition: A | Discharge status: Home / Self Care | Payer: Medicaid Other | Attending: Emergency Medicine | Admitting: Emergency Medicine

## 2018-05-06 ENCOUNTER — Other Ambulatory Visit: Payer: Self-pay

## 2018-05-06 DIAGNOSIS — R51 Headache: Secondary | ICD-10-CM

## 2018-05-06 DIAGNOSIS — H04123 Dry eye syndrome of bilateral lacrimal glands: Secondary | ICD-10-CM

## 2018-05-06 DIAGNOSIS — R519 Headache, unspecified: Secondary | ICD-10-CM

## 2018-05-06 DIAGNOSIS — R42 Dizziness and giddiness: Secondary | ICD-10-CM | POA: Insufficient documentation

## 2018-05-06 MED ORDER — MECLIZINE HCL 25 MG PO TABS: 25.0000 mg | ORAL_TABLET | Freq: Three times a day (TID) | ORAL | 0 refills | Status: DC | PRN

## 2018-05-06 NOTE — ED Triage Notes (Signed)
Pt cc headaches and blurry vision and dizziness. Pt was in the ER at [place] on 05/02/2018.  See there note.

## 2018-05-06 NOTE — ED Provider Notes (Signed)
HPI  SUBJECTIVE:  Dana Hughes is a 38 y.o. female who presents with several weeks of burning, watery eyes, intermittently blurry vision that lasts for about 10 to 15 minutes accompanied with a gradual onset, mild, frontal, throbbing headache then followed by dizziness, lightheadedness described as "the room spinning around me".  The vertigo lasts 5 to 7 minutes and then resolves.  She denies fevers, nausea, vomiting, chest pain, shortness of breath, palpitations.  States that she is supposed to wear glasses, but cannot afford them.  She denies facial droop, arm or leg weakness, discoordination, aphasia, slurred speech, ear pain or fullness.  She states that she has had intermittent tinnitus for the past several days but it is not associated with the vertigo.  She was having the vertigo before the tinnitus.  No nasal congestion, rhinorrhea, facial swelling, upper dental pain.  She has tried reading glasses, aspirin, 1 OTC Aleve bid, pushing fluids.  No alleviating factors.  She states symptoms are worse when she turns her head quickly and when she stands up fast.  She states she has been having the symptoms before Christmas, but they have become worse over the past week.  Patient seen in the [facility] ER 4 days ago for this, had normal CBC, BMP, EKG.  She was not pregnant at that time.  She states she has not had intercourse since the negative pregnancy test.  No Specific cause Of her dizziness was found.  Her eye complaints were thought to be due to dry eye.  She has a past medical history of hypertension.  She states that she restarted the amlodipine 5 mg daily 1 month ago.  She states that she is not taking the Dyazide.  She has a history of asthma, anxiety, fibroids, endometriosis, migraines.  PMD: None.  Past Medical History:  Diagnosis Date  . Anxiety   . Arthritis   . Asthma   . BV (bacterial vaginosis)   . Candidiasis   . Depression   . Headache(784.0)    migraines  . Hypertension    no longer takes meds  . Muscle spasm   . Ovarian cyst   . Scoliosis     Past Surgical History:  Procedure Laterality Date  . CHOLECYSTECTOMY    . TUBAL LIGATION      Family History  Problem Relation Age of Onset  . Cancer Father   . Hypertension Maternal Aunt   . Breast cancer Maternal Aunt   . Cancer Paternal Grandmother   . Breast cancer Paternal Grandmother   . Breast cancer Paternal Aunt     Social History   Tobacco Use  . Smoking status: Former Research scientist (life sciences)  . Smokeless tobacco: Never Used  Substance Use Topics  . Alcohol use: Yes    Comment: rare  . Drug use: Yes    Frequency: 1.0 times per week    Types: Marijuana    No current facility-administered medications for this encounter.   Current Outpatient Medications:  .  albuterol (PROVENTIL HFA;VENTOLIN HFA) 108 (90 Base) MCG/ACT inhaler, Inhale 2 puffs into the lungs every 6 (six) hours as needed for wheezing or shortness of breath. Reported on 09/29/2015, Disp: , Rfl:  .  amLODipine (NORVASC) 5 MG tablet, Take 1 tablet (5 mg total) by mouth daily., Disp: 30 tablet, Rfl: 1 .  artificial tears (LACRILUBE) OINT ophthalmic ointment, Place into both eyes at bedtime as needed for dry eyes., Disp: 3.5 g, Rfl: 0 .  meclizine (ANTIVERT) 25 MG tablet,  Take 1 tablet (25 mg total) by mouth 3 (three) times daily as needed for dizziness., Disp: 30 tablet, Rfl: 0 .  triamterene-hydrochlorothiazide (DYAZIDE) 37.5-25 MG capsule, Take 1 each (1 capsule total) by mouth every morning. (Patient not taking: Reported on 02/19/2018), Disp: 30 capsule, Rfl: 11  Allergies  Allergen Reactions  . Mushroom Extract Complex Anaphylaxis  . Latex Itching and Swelling  . Penicillins Itching    Has patient had a PCN reaction causing immediate rash, facial/tongue/throat swelling, SOB or lightheadedness with hypotension: No Has patient had a PCN reaction causing severe rash involving mucus membranes or skin necrosis: No Has patient had a PCN reaction  that required hospitalization No Has patient had a PCN reaction occurring within the last 10 years: No If all of the above answers are "NO", then may proceed with Cephalosporin use.      ROS  As noted in HPI.   Physical Exam  BP 121/80 (BP Location: Right Arm)   Pulse 86   Temp 98.7 F (37.1 C) (Tympanic)   Resp 18   Wt 96.6 kg   LMP 05/06/2018   SpO2 100%   BMI 37.73 kg/m   Constitutional: Well developed, well nourished, no acute distress Eyes: PERRLA, EOMI, conjunctiva normal bilaterally.  Patient reports pain with EOMs.  No photophobia.  Visual acuity: Left eye 20/30, right 20/25, bilateral 20/25. Visual acuity: HENT: Normocephalic, atraumatic,mucus membranes moist.  Mild nasal congestion.  Normal turbinates.  Mild frontal sinus tenderness.  No maxillary sinus tenderness.  Normal oropharynx. Respiratory: Normal inspiratory effort lungs clear bilaterally Cardiovascular: Normal rate, regular rhythm, no murmurs, rubs, gallops.  No carotid bruit. GI: nondistended skin: No rash, skin intact Musculoskeletal: no deformities Neurologic: Alert & oriented x 3, cranial nerves III through XII intact as tested.  Finger-nose, heel shin within normal limits.  Tandem gait steady, Romberg negative.  Dix-Hallpike negative bilaterally. Psychiatric: Speech and behavior appropriate   ED Course   Medications - No data to display  Orders Placed This Encounter  Procedures  . Visual acuity screening    Standing Status:   Standing    Number of Occurrences:   1    No results found for this or any previous visit (from the past 24 hour(s)). No results found.  ED Clinical Impression  Vertigo  Acute nonintractable headache, unspecified headache type  Dry eye syndrome of both eyes   ED Assessment/Plan  ER records reviewed.  As noted in HPI.  Unsure as to the exact etiology of the patient's symptoms, but I suspect that her eye pain is from dry eye.  Do not think that it is  glaucoma.  She has a prescription of Lacri-Lube which I advised her to start.  Will advise Systane.  As for her dizziness she describes it to me as vertigo.  Doubt cerebellar stroke, stroke, mass at this point in time.  I do not think that she has a sinus infection today though she does have some sinus tenderness. Advised increasing the Aleve to 2 tabs twice a day, advised her to not take more than 500 mg at a time for the next 3 or 4 days.  Advised her to take 1 g of Tylenol with the Aleve.  Will send home with meclizine.  Referral to [facility] neurology. also placing a primary care order referral and providing a primary care referral list as patient states that she is having trouble getting in with the community [facility] wellness center.  Spent over 25 minutes  face-to-face with the patient.  Discussed  MDM, treatment plan, and plan for follow-up with patient. Discussed sn/sx that should prompt return to the ED. patient agrees with plan.   Meds ordered this encounter  Medications  . meclizine (ANTIVERT) 25 MG tablet    Sig: Take 1 tablet (25 mg total) by mouth 3 (three) times daily as needed for dizziness.    Dispense:  30 tablet    Refill:  0    *This clinic note was created using [employer]. Therefore, there may be occasional mistakes despite careful proofreading.     Melynda Ripple, MD 05/06/18 [phone]

## 2018-05-06 NOTE — Discharge Instructions (Addendum)
Take 2 tabs of Aleve with 1 g of Tylenol twice a day.  Do not exceed more than 500 mg Aleve at a time.  Do this for the next 3 or 4 days and see if it does not make your headache any better.  The meclizine will help with the vertigo/dizziness.  Start the Lacri-Lube at night and use the Systane during the day as often as you want.  Below is a list of primary care practices who are taking new patients for you to follow-up with. [facility] [place], [place] [address] [phone]  [facility] [phone] [place] [address]  [facility]: [place]  [phone]  [facility]: [place]   [phone]  [facility]: [address] [place]  [phone]  [place] : [address] [place] [phone]  [facility]: [address] [place] [id number] [phone]  [facility]: [place] [phone]  Dr. Blanchie Serve [place]  [phone]  Dr. Benito Mccreedy, [facility]. [place], [place] [address]  [phone]  Go to www.goodrx.com to look up your medications. This will give you a list of where you can find your prescriptions at the most affordable prices. Or ask the pharmacist what the cash price is, or if they have any other discount programs available to help make your medication more affordable. This can be less expensive than what you would pay with insurance.

## 2018-05-15 ENCOUNTER — Ambulatory Visit (INDEPENDENT_AMBULATORY_CARE_PROVIDER_SITE_OTHER): Payer: Self-pay | Admitting: Obstetrics and Gynecology

## 2018-05-15 ENCOUNTER — Other Ambulatory Visit (HOSPITAL_COMMUNITY)
Admission: RE | Admit: 2018-05-15 | Discharge: 2018-05-15 | Disposition: A | Discharge status: Home / Self Care | Payer: Medicaid Other | Source: Ambulatory Visit | Attending: Obstetrics & Gynecology | Admitting: Obstetrics & Gynecology

## 2018-05-15 ENCOUNTER — Encounter: Payer: Self-pay | Admitting: Obstetrics and Gynecology

## 2018-05-15 VITALS — BP 127/94 | HR 86 | Ht 63.0 in | Wt 216.9 lb

## 2018-05-15 DIAGNOSIS — B3731 Acute candidiasis of vulva and vagina: Secondary | ICD-10-CM

## 2018-05-15 DIAGNOSIS — N809 Endometriosis, unspecified: Secondary | ICD-10-CM

## 2018-05-15 DIAGNOSIS — R102 Pelvic and perineal pain: Secondary | ICD-10-CM | POA: Insufficient documentation

## 2018-05-15 DIAGNOSIS — N898 Other specified noninflammatory disorders of vagina: Secondary | ICD-10-CM | POA: Insufficient documentation

## 2018-05-15 HISTORY — DX: Acute candidiasis of vulva and vagina: B37.31

## 2018-05-15 LAB — HEMOGLOBIN A1C
ESTIMATED AVERAGE GLUCOSE: 123 mg/dL
HEMOGLOBIN A1C: 5.9 % — AB (ref 4.8–5.6)

## 2018-05-15 MED ORDER — PHENAZOPYRIDINE HCL 100 MG PO TABS: 100.0000 mg | ORAL_TABLET | Freq: Three times a day (TID) | ORAL | 0 refills | Status: DC | PRN

## 2018-05-15 MED ORDER — NITROFURANTOIN MONOHYD MACRO 100 MG PO CAPS: 100.0000 mg | ORAL_CAPSULE | Freq: Two times a day (BID) | ORAL | 0 refills | Status: AC

## 2018-05-15 NOTE — Patient Instructions (Signed)
Pelvic Pain, Female  Pelvic pain is pain in your lower belly (abdomen), below your belly button and between your hips. The pain may start suddenly (be acute), keep coming back (be recurring), or last a long time (become chronic). Pelvic pain that lasts longer than 6 months is called chronic pelvic pain. There are many causes of pelvic pain. Sometimes the cause of pelvic pain is not known.  Follow these instructions at home:    · Take over-the-counter and prescription medicines only as told by your doctor.  · Rest as told by your doctor.  · Do not have sex if it hurts.  · Keep a journal of your pelvic pain. Write down:  ? When the pain started.  ? Where the pain is located.  ? What seems to make the pain better or worse, such as food or your period (menstrual cycle).  ? Any symptoms you have along with the pain.  · Keep all follow-up visits as told by your doctor. This is important.  Contact a doctor if:  · Medicine does not help your pain.  · Your pain comes back.  · You have new symptoms.  · You have unusual discharge or bleeding from your vagina.  · You have a fever or chills.  · You are having trouble pooping (constipation).  · You have blood in your pee (urine) or poop (stool).  · Your pee smells bad.  · You feel weak or light-headed.  Get help right away if:  · You have sudden pain that is very bad.  · Your pain keeps getting worse.  · You have very bad pain and also have any of these symptoms:  ? A fever.  ? Feeling sick to your stomach (nausea).  ? Throwing up (vomiting).  ? Being very sweaty.  · You pass out (lose consciousness).  Summary  · Pelvic pain is pain in your lower belly (abdomen), below your belly button and between your hips.  · There are many possible causes of pelvic pain.  · Keep a journal of your pelvic pain.  This information is not intended to replace advice given to you by your health care provider. Make sure you discuss any questions you have with your health care provider.  Document  Released: 09/26/2007 Document Revised: 09/25/2017 Document Reviewed: 09/25/2017  Elsevier Interactive Patient Education © 2019 [employer] Inc[employer]

## 2018-05-15 NOTE — Progress Notes (Signed)
Ms Ungar presents for follow up from prior OV last yr. Pt continues to have pelvic pain. Pain is daily, worsens with IC. Has had since BTL in 2005.  Cycles are irregular as well. Painful with cramps and clots. Thought to be endometriosis by Sx only. No surgerical Dx. Has tried OCP's and Depo Provera in the past without relief.  Vaginal discharge as well.  Sexual active with same partner.   TSVD x 3 (largest 7 # 14 oz)  U/S 4/19 small fibroid o/w normal.  Pe AF VSS Lungs clear Heart RRR Abd soft + BS  GU Nl EGBUS white frothy discharge culture obtained + bladder tenderness uterus small mobile non tender no adnexal masses or tenderness  A/P Pelvic pain        Vaginal discharge         AUB  Do not think pt has endometriosis. I suspect pt's pain is related to chronic cystitis. Will treat according. HgbA1C d/t to chronic vaginal infection. Will address AUB in more detail once pelivc pain has resolved. Information on tubal reversal ( Dr Kerin Perna) provided to pt.

## 2018-05-16 LAB — CERVICOVAGINAL ANCILLARY ONLY
Bacterial vaginitis: POSITIVE — AB
CANDIDA VAGINITIS: POSITIVE — AB
CHLAMYDIA, DNA PROBE: NEGATIVE
Neisseria Gonorrhea: NEGATIVE
Trichomonas: NEGATIVE

## 2018-05-19 ENCOUNTER — Telehealth: Payer: Self-pay | Admitting: *Deleted

## 2018-05-19 NOTE — Telephone Encounter (Addendum)
-----   Message from Chancy Milroy, MD sent at 05/19/2018  2:39 PM EST ----- Diflucan 150 mg po now and repeat in 3 days for yeast Flagyl 500 mg po bid x 7 days for BV Thanks Michael  [phone]  Called pt and informed of test results and treatment prescribed. Pt voiced understanding and also stated that another medication which was prescribed last week (Macrobid) is very expensive. She asked if there is an alternative. I recommended pt try GoodRx website to see if another pharmacy can provide the medication at a lower cost. Pt voiced understanding.

## 2018-05-30 ENCOUNTER — Encounter (HOSPITAL_COMMUNITY): Payer: Self-pay

## 2018-05-30 ENCOUNTER — Emergency Department (HOSPITAL_COMMUNITY)
Admission: EM | Admit: 2018-05-30 | Discharge: 2018-05-30 | Disposition: A | Discharge status: Home / Self Care | Payer: Self-pay | Attending: Emergency Medicine | Admitting: Emergency Medicine

## 2018-05-30 ENCOUNTER — Other Ambulatory Visit: Payer: Self-pay

## 2018-05-30 ENCOUNTER — Telehealth: Payer: Self-pay | Admitting: *Deleted

## 2018-05-30 ENCOUNTER — Emergency Department (HOSPITAL_COMMUNITY): Payer: Self-pay

## 2018-05-30 DIAGNOSIS — N939 Abnormal uterine and vaginal bleeding, unspecified: Secondary | ICD-10-CM | POA: Insufficient documentation

## 2018-05-30 DIAGNOSIS — I10 Essential (primary) hypertension: Secondary | ICD-10-CM | POA: Insufficient documentation

## 2018-05-30 DIAGNOSIS — R0789 Other chest pain: Secondary | ICD-10-CM | POA: Insufficient documentation

## 2018-05-30 DIAGNOSIS — J4521 Mild intermittent asthma with (acute) exacerbation: Secondary | ICD-10-CM | POA: Insufficient documentation

## 2018-05-30 DIAGNOSIS — J45909 Unspecified asthma, uncomplicated: Secondary | ICD-10-CM | POA: Insufficient documentation

## 2018-05-30 DIAGNOSIS — Z79899 Other long term (current) drug therapy: Secondary | ICD-10-CM | POA: Insufficient documentation

## 2018-05-30 DIAGNOSIS — R05 Cough: Secondary | ICD-10-CM | POA: Insufficient documentation

## 2018-05-30 DIAGNOSIS — F1210 Cannabis abuse, uncomplicated: Secondary | ICD-10-CM | POA: Insufficient documentation

## 2018-05-30 DIAGNOSIS — R102 Pelvic and perineal pain: Secondary | ICD-10-CM | POA: Insufficient documentation

## 2018-05-30 DIAGNOSIS — G8929 Other chronic pain: Secondary | ICD-10-CM | POA: Insufficient documentation

## 2018-05-30 DIAGNOSIS — Z91040 Latex allergy status: Secondary | ICD-10-CM | POA: Insufficient documentation

## 2018-05-30 LAB — POC URINE PREG, ED: Preg Test, Ur: NEGATIVE

## 2018-05-30 LAB — URINALYSIS, ROUTINE W REFLEX MICROSCOPIC
Bilirubin Urine: NEGATIVE
Glucose, UA: NEGATIVE mg/dL
Ketones, ur: NEGATIVE mg/dL
Nitrite: NEGATIVE
Protein, ur: 30 mg/dL — AB
RBC / HPF: >50 RBC/hpf — ABNORMAL HIGH (ref 0–5)
Specific Gravity, Urine: 1.01 (ref 1.005–1.030)
pH: 9 — ABNORMAL HIGH (ref 5.0–8.0)

## 2018-05-30 MED ORDER — PREDNISONE 10 MG PO TABS: 40.0000 mg | ORAL_TABLET | Freq: Every day | ORAL | 0 refills | Status: AC

## 2018-05-30 MED ORDER — PREDNISONE 20 MG PO TABS
60.0000 mg | ORAL_TABLET | Freq: Once | ORAL | Status: AC
  Administered 2018-05-30: 60 mg via ORAL
  Filled 2018-05-30: qty 3

## 2018-05-30 MED ORDER — IPRATROPIUM-ALBUTEROL 0.5-2.5 (3) MG/3ML IN SOLN
3.0000 mL | Freq: Once | RESPIRATORY_TRACT | Status: AC
  Administered 2018-05-30: 3 mL via RESPIRATORY_TRACT
  Filled 2018-05-30: qty 3

## 2018-05-30 MED ORDER — HYDROCODONE-HOMATROPINE 5-1.5 MG/5ML PO SYRP: 5.0000 mL | ORAL_SOLUTION | Freq: Four times a day (QID) | ORAL | 0 refills | Status: DC | PRN

## 2018-05-30 MED ORDER — NAPROXEN 500 MG PO TABS
500.0000 mg | ORAL_TABLET | Freq: Once | ORAL | Status: AC
  Administered 2018-05-30: 500 mg via ORAL
  Filled 2018-05-30: qty 1

## 2018-05-30 NOTE — ED Provider Notes (Signed)
[facility] DEPT Provider Note   CSN: [id number] Arrival date & time: 05/30/18  0946     History   Chief Complaint Chief Complaint  Patient presents with  . Abdominal Pain    HPI Dana Hughes is a 38 y.o. female with history of BTL, chronic pelvic pain, asthma and bronchitis is here for evaluation of "cold symptoms".  Onset 2 weeks ago.  She reports nasal congestion, hot/cold flashes, dry cough, chest wall pain with coughing, runny intermittent bowel movements.  Her godchildren, boyfriend and own children were sick recently.  Has been using over-the-counter remedies, albuterol inhaler without benefit.  Her chest wall is hurting now with coughing and movement.  No alleviating factors.    Additionally reports this morning she woke up and noticed vaginal dark blood/spotting/streaks with wiping.  Associated with breakthrough pelvic pain that she attributes to "endometriosis". Pain is moderate, sharp, stabbing midline lower abdomen.  Reports pain has been there for almost 3 years, is followed by OB/GYN for chronic pelvic pain and recently seen 2 weeks ago.  She has tried naproxen, Aleve and Advil without benefit.  States that OB/GYN has recommended observation and anti-inflammatories and if this does not work laparoscopy to rule out endometriosis.  She called OB/GYN and was told to go to [facility] but states that EMS transported her here. Has irregular menstrual cycles. LMP 3 weeks ago, but had 2 days of spotting one week ago and return of spotting today.  No new sexual encounter/partners.   Denies fevers, nausea, vomiting, dysuria, hematuria, changes to vaginal discharge, exertional chest pain or shortness of breath.   HPI  Past Medical History:  Diagnosis Date  . Anxiety   . Arthritis   . Asthma   . BV (bacterial vaginosis)   . Candidiasis   . Depression   . Headache(784.0)    migraines  . Hypertension    no longer takes meds  . Migraines   . Muscle  spasm   . Ovarian cyst   . Scoliosis     Patient Active Problem List   Diagnosis Date Noted  . Vaginal discharge 05/15/2018  . Chronic hypertension during pregnancy, antepartum 08/30/2017  . Endometriosis 08/30/2017  . Pelvic pain in female 09/29/2015    Past Surgical History:  Procedure Laterality Date  . CHOLECYSTECTOMY    . TUBAL LIGATION       OB History    Gravida  4   Para  3   Term  3   Preterm  0   AB  1   Living  3     SAB  1   TAB  0   Ectopic  0   Multiple  0   Live Births  3        Obstetric Comments  SVD x 3         Home Medications    Prior to Admission medications   Medication Sig Start Date End Date Taking? Authorizing Provider  albuterol (PROVENTIL HFA;VENTOLIN HFA) 108 (90 Base) MCG/ACT inhaler Inhale 2 puffs into the lungs every 6 (six) hours as needed for wheezing or shortness of breath. Reported on 09/29/2015   Yes [provider]  amLODipine (NORVASC) 5 MG tablet Take 1 tablet (5 mg total) by mouth daily. 02/19/18  Yes Sloan Leiter, MD  artificial tears (LACRILUBE) OINT ophthalmic ointment Place into both eyes at bedtime as needed for dry eyes. 05/02/18  Yes Valere Dross, Alyssa B, PA-C  ibuprofen (ADVIL,MOTRIN)  200 MG tablet Take 400 mg by mouth daily as needed for moderate pain or cramping.   Yes [provider]  meclizine (ANTIVERT) 25 MG tablet Take 1 tablet (25 mg total) by mouth 3 (three) times daily as needed for dizziness. 05/06/18  Yes Melynda Ripple, MD  naproxen sodium (ALEVE) 220 MG tablet Take 440 mg by mouth daily as needed (pain).   Yes [provider]  PE-DM-APAP & Doxylamin-DM-APAP (VICKS DAYQUIL/NYQUIL CLD & FLU) (Liquid) MISC Take 30 mLs by mouth 2 (two) times daily as needed (cough).   Yes [provider]  HYDROcodone-homatropine (HYCODAN) 5-1.5 MG/5ML syrup Take 5 mLs by mouth every 6 (six) hours as needed for cough. FOR DISRUPTIVE NIGHT TIME COUGH AND BODY ACHES 05/30/18   Kinnie Feil, PA-C  phenazopyridine (PYRIDIUM) 100 MG tablet Take 1 tablet (100 mg total) by mouth 3 (three) times daily as needed for pain. Patient not taking: Reported on 05/30/2018 05/15/18 05/15/19  Chancy Milroy, MD  predniSONE (DELTASONE) 10 MG tablet Take 4 tablets (40 mg total) by mouth daily for 4 days. 05/30/18 06/03/18  Kinnie Feil, PA-C  triamterene-hydrochlorothiazide (DYAZIDE) 37.5-25 MG capsule Take 1 each (1 capsule total) by mouth every morning. Patient not taking: Reported on 05/30/2018 03/21/17   Shelly Bombard, MD    Family History Family History  Problem Relation Age of Onset  . Cancer Father   . Hypertension Maternal Aunt   . Breast cancer Maternal Aunt   . Cancer Paternal Grandmother   . Breast cancer Paternal Grandmother   . Breast cancer Paternal Aunt     Social History Social History   Tobacco Use  . Smoking status: Never Smoker  . Smokeless tobacco: Never Used  Substance Use Topics  . Alcohol use: Not Currently    Comment: rare  . Drug use: Yes    Frequency: 1.0 times per week    Types: Marijuana     Allergies   Mushroom extract complex; Latex; and Penicillins   Review of Systems Review of Systems  HENT: Positive for congestion, postnasal drip and rhinorrhea.   Respiratory: Positive for cough.   Genitourinary: Positive for pelvic pain and vaginal bleeding.     Physical Exam Updated Vital Signs BP (!) 124/103 (BP Location: Left Arm)   Pulse 89   Temp 98.4 F (36.9 C) (Oral)   Resp 16   Ht 5\' 3"  (1.6 m)   Wt 98 kg   LMP 05/06/2018   SpO2 98%   BMI 38.27 kg/m   Physical Exam Vitals signs and nursing note reviewed.  Constitutional:      Appearance: She is well-developed.     Comments: Non toxic  HENT:     Head: Normocephalic and atraumatic.     Nose: Nose normal.  Eyes:     Conjunctiva/sclera: Conjunctivae normal.     Pupils: Pupils are equal, round, and reactive to light.  Neck:     Musculoskeletal: Normal range of  motion.  Cardiovascular:     Rate and Rhythm: Normal rate and regular rhythm.  Pulmonary:     Effort: Pulmonary effort is normal.     Breath sounds: Wheezing present.     Comments: Faint end expiratory wheezing in lower lobes posteriorly bilaterally.  Abdominal:     General: Bowel sounds are normal.     Palpations: Abdomen is soft.     Tenderness: There is no abdominal tenderness.     Comments: No G/R/R. No suprapubic or  CVA tenderness. Negative Murphy's and McBurney's. Active BS to lower quadrants.   Musculoskeletal: Normal range of motion.  Skin:    General: Skin is warm and dry.     Capillary Refill: Capillary refill takes less than 2 seconds.  Neurological:     Mental Status: She is alert and oriented to person, place, and time.  Psychiatric:        Behavior: Behavior normal.      ED Treatments / Results  Labs (all labs ordered are listed, but only abnormal results are displayed) Labs Reviewed  URINALYSIS, ROUTINE W REFLEX MICROSCOPIC - Abnormal; Notable for the following components:      Result Value   Color, Urine RED (*)    APPearance HAZY (*)    pH 9.0 (*)    Hgb urine dipstick LARGE (*)    Protein, ur 30 (*)    Leukocytes, UA TRACE (*)    RBC / HPF >50 (*)    Bacteria, UA FEW (*)    All other components within normal limits  URINE CULTURE  POC URINE PREG, ED    EKG None  Radiology Dg Chest 2 View  Result Date: 05/30/2018 CLINICAL DATA:  Cough and congestion.  Shortness of breath EXAM: CHEST - 2 VIEW COMPARISON:  December 01, 2014 FINDINGS: The lungs are clear. The heart size and pulmonary vascularity are normal. No adenopathy. No bone lesions. IMPRESSION: No edema or consolidation. Electronically Signed   By: Lowella Grip III M.D.   On: 05/30/2018 11:20    Procedures Procedures (including critical care time)  Medications Ordered in ED Medications  naproxen (NAPROSYN) tablet 500 mg (500 mg Oral Given 05/30/18 1147)  ipratropium-albuterol (DUONEB)  0.5-2.5 (3) MG/3ML nebulizer solution 3 mL (3 mLs Nebulization Given 05/30/18 1147)  predniSONE (DELTASONE) tablet 60 mg (60 mg Oral Given 05/30/18 1147)     Initial Impression / Assessment and Plan / ED Course  I have reviewed the triage vital signs and the nursing notes.  Pertinent labs & imaging results that were available during my care of the patient were reviewed by me and considered in my medical decision making (see chart for details).    38 year old here with URI symptoms, persistent cough.  History of asthma.  She is nontoxic, with faint expiratory wheezing to lower lobes but no fever, tachypnea, tachycardia or hypoxemia.  Wheezing improved after breathing treatments in the ER and prednisone.  Chest x-ray obtained due to prolonged symptoms but this was negative.  Likely post viral URI cough versus asthma exacerbation.  I do not see indication for further emergent lab work.  We will treat symptoms with prednisone, albuterol and symptomatic management.  She is aware of symptoms that would warrant return to the ER.  I doubt pneumonia.    In regards to her chronic pelvic pain, she has no changes to her chronic pelvic pain today.  Negative pregnancy test.  She has no urinary symptoms, suprapubic or CVA tenderness.  UA not convincing of infection but will send urine for culture.  I reviewed recent OB/GYN visit.  She had pelvic exam, swabs and ultrasound.  Appears OBGYN plan is to treat pelvic pain with anti-inflammatories and if refractory consider laparoscopy to rule out endometriosis.  Given chronicity of symptoms, I doubt PID, torsion.  I discussed this with patient who is comfortable deferring repeat pelvic exam today.  Recommended f/u with OBGYN for further discussion of management of intermittent break through pelvic pain. Return pracutions given.   Final  Clinical Impressions(s) / ED Diagnoses   Final diagnoses:  Mild intermittent asthma with exacerbation  Chronic pelvic pain in female     ED Discharge Orders         Ordered    predniSONE (DELTASONE) 10 MG tablet  Daily     05/30/18 1335    HYDROcodone-homatropine (HYCODAN) 5-1.5 MG/5ML syrup  Every 6 hours PRN     05/30/18 1335           Arlean Hopping 05/30/18 1348    Mesner, Corene Cornea, MD 05/30/18 1426

## 2018-05-30 NOTE — ED Triage Notes (Signed)
Pt BIBA from home. Pt c/o lower abd and uterine pain since last night. Last menstrual cycle 1 week ago. Recent dx of endometriosis. Pt also states she is having some spotting.  Pt also c/o cough. Sharp abd pain in LUQ with palpation.

## 2018-05-30 NOTE — Discharge Instructions (Signed)
You were seen in the ER for cold-like symptoms, cough.  X-ray is normal.  I suspect this is a viral related cough possibly a mild asthma exacerbation.  Take prednisone as prescribed.  Use your albuterol inhaler as needed for coughing, chest tightness and wheezing.  Have sent a prescription for cough medicine.  An alternative is Delsym which can help suppress cough.  Return to the ER for fever, chest pain, shortness of breath, worsening symptoms.  Follow-up with OB/GYN for ongoing chronic pelvic pain management.  Return to the ER if there is any changes to the pelvic pain, fevers, chills, changes in vaginal discharge or urinary symptoms.

## 2018-05-30 NOTE — Telephone Encounter (Signed)
Pt called office requesting advice of what to do regarding her ailments. She stated that she has had "real bad sharp, stabbing pelvic pain" since last night. She tool ibuprofen 400 mg last night with no relief. She aslo has taken Aleve - 1 tab @ 5:30 this morning and is still having pain. Additionally she has had cough x1 week which has gotten worse as well as now having feelings of being hot and cold. Because pt has multiple acute sx, I advised her to come to [facility] for evaluation. Pt was instructed to put on a mask as soon as she enters the building. Pt voiced understanding of information and instructions given.

## 2018-05-31 LAB — URINE CULTURE: Culture: <10000 — AB

## 2018-06-17 ENCOUNTER — Telehealth: Payer: Self-pay

## 2018-06-17 NOTE — Telephone Encounter (Signed)
Returned call to patient about her Voicemail she left at our call center. Advised patient that we cannot refill her Rx and to keep her follow up appointment with Dr.Ervin on the 5th.

## 2018-06-26 ENCOUNTER — Ambulatory Visit: Payer: Medicaid Other | Admitting: Obstetrics and Gynecology

## 2018-07-02 ENCOUNTER — Encounter (HOSPITAL_COMMUNITY): Payer: Self-pay | Admitting: Emergency Medicine

## 2018-07-02 ENCOUNTER — Ambulatory Visit: Payer: Medicaid Other | Admitting: Obstetrics and Gynecology

## 2018-07-02 ENCOUNTER — Ambulatory Visit (HOSPITAL_COMMUNITY)
Admission: EM | Admit: 2018-07-02 | Discharge: 2018-07-02 | Disposition: A | Discharge status: Home / Self Care | Payer: Self-pay | Attending: Family Medicine | Admitting: Family Medicine

## 2018-07-02 ENCOUNTER — Other Ambulatory Visit: Payer: Self-pay

## 2018-07-02 ENCOUNTER — Ambulatory Visit (INDEPENDENT_AMBULATORY_CARE_PROVIDER_SITE_OTHER): Payer: Self-pay

## 2018-07-02 DIAGNOSIS — O10919 Unspecified pre-existing hypertension complicating pregnancy, unspecified trimester: Secondary | ICD-10-CM

## 2018-07-02 DIAGNOSIS — R05 Cough: Secondary | ICD-10-CM

## 2018-07-02 DIAGNOSIS — J4521 Mild intermittent asthma with (acute) exacerbation: Secondary | ICD-10-CM

## 2018-07-02 MED ORDER — ALBUTEROL SULFATE HFA 108 (90 BASE) MCG/ACT IN AERS
INHALATION_SPRAY | RESPIRATORY_TRACT | Status: AC
  Filled 2018-07-02: qty 13.4

## 2018-07-02 MED ORDER — ALBUTEROL SULFATE (2.5 MG/3ML) 0.083% IN NEBU
INHALATION_SOLUTION | RESPIRATORY_TRACT | Status: AC
  Filled 2018-07-02: qty 3

## 2018-07-02 MED ORDER — ALBUTEROL SULFATE (2.5 MG/3ML) 0.083% IN NEBU: 2.5000 mg | INHALATION_SOLUTION | Freq: Four times a day (QID) | RESPIRATORY_TRACT | 0 refills | Status: DC | PRN

## 2018-07-02 MED ORDER — AMLODIPINE BESYLATE 5 MG PO TABS: 5.0000 mg | ORAL_TABLET | Freq: Every day | ORAL | 1 refills | Status: DC

## 2018-07-02 MED ORDER — ALBUTEROL SULFATE HFA 108 (90 BASE) MCG/ACT IN AERS
2.0000 | INHALATION_SPRAY | Freq: Once | RESPIRATORY_TRACT | Status: AC
  Administered 2018-07-02: 2 via RESPIRATORY_TRACT

## 2018-07-02 MED ORDER — IPRATROPIUM-ALBUTEROL 0.5-2.5 (3) MG/3ML IN SOLN
3.0000 mL | Freq: Once | RESPIRATORY_TRACT | Status: AC
  Administered 2018-07-02: 3 mL via RESPIRATORY_TRACT

## 2018-07-02 MED ORDER — BENZONATATE 100 MG PO CAPS: 100.0000 mg | ORAL_CAPSULE | Freq: Three times a day (TID) | ORAL | 0 refills | Status: DC

## 2018-07-02 MED ORDER — PREDNISONE 20 MG PO TABS: 20.0000 mg | ORAL_TABLET | Freq: Two times a day (BID) | ORAL | 0 refills | Status: AC

## 2018-07-02 NOTE — ED Triage Notes (Signed)
Reports February 7 symptoms started.  Patient reports she has not felt she was completely better.   Hot and cold chills, sore throat, ribcage soreness, congestion in chest and wheezing.  Reports yellow and white mucous.  These symptoms worsened over the weekend

## 2018-07-02 NOTE — Discharge Instructions (Addendum)
Chest x-ray did not show cardiopulmonary disease.  Symptoms possibly related to asthma flare.   Duo-neb given in office with relief Nebulizer given in office.  Albuterol solution prescribed.  Use as instructed Get plenty of rest and push fluids Tessalon Perles prescribed for cough Prednisone prescribed.  Take as directed and to completion Use albuterol inhaler as needed for shortness of breath and/ or wheezing Use OTC medications like ibuprofen or tylenol as needed fever or pain Follow up with PCP or with [facility] in the next few weeks for recheck and to ensure your symptoms are impoving Return or go to ER if you have any new or worsening symptoms fever, chills, nausea, vomiting, chest pain, cough, shortness of breath, wheezing, abdominal pain, changes in bowel or bladder habits, etc...  Request blood pressure medication refills.

## 2018-07-02 NOTE — ED Provider Notes (Signed)
[facility]   [id number] 07/02/18 Arrival Time: 1501   CC: URI symptoms   SUBJECTIVE: History from: patient.  Dana Hughes is a 38 y.o. female hx significant for asthma, and HTN, who presents with worsening productive cough with yellow, and white phlegm and chest congestion since 05/30/2018.  Was seen at [facility] and treated with prednisone and hycodan for mild intermittent asthma exacerbation.  Pt denies improvement in symptoms with medication.  Denies sick exposure or precipitating event.  Has tried OTC medication without relief.  Symptoms are made worse at night.  Denies previous symptoms in the past.  Complains of fever, tmax of 99.9 last saturday, chills, fatigue, SOB, and wheezing. Denies sinus pain, rhinorrhea, sore throat, chest pain, nausea, changes in bowel or bladder habits.    Received flu shot this year: no.  ROS: As per HPI.  Past Medical History:  Diagnosis Date  . Anxiety   . Arthritis   . Asthma   . BV (bacterial vaginosis)   . Candidiasis   . Depression   . Headache(784.0)    migraines  . Hypertension    no longer takes meds  . Migraines   . Muscle spasm   . Ovarian cyst   . Scoliosis    Past Surgical History:  Procedure Laterality Date  . CHOLECYSTECTOMY    . TUBAL LIGATION     Allergies  Allergen Reactions  . Mushroom Extract Complex Anaphylaxis  . Latex Itching and Swelling  . Penicillins Itching    Has patient had a PCN reaction causing immediate rash, facial/tongue/throat swelling, SOB or lightheadedness with hypotension: No Has patient had a PCN reaction causing severe rash involving mucus membranes or skin necrosis: No Has patient had a PCN reaction that required hospitalization No Has patient had a PCN reaction occurring within the last 10 years: No If all of the above answers are "NO", then may proceed with Cephalosporin use.    No current facility-administered medications on file prior to encounter.    Current Outpatient  Medications on File Prior to Encounter  Medication Sig Dispense Refill  . albuterol (PROVENTIL HFA;VENTOLIN HFA) 108 (90 Base) MCG/ACT inhaler Inhale 2 puffs into the lungs every 6 (six) hours as needed for wheezing or shortness of breath. Reported on 09/29/2015    . artificial tears (LACRILUBE) OINT ophthalmic ointment Place into both eyes at bedtime as needed for dry eyes. 3.5 g 0  . ibuprofen (ADVIL,MOTRIN) 200 MG tablet Take 400 mg by mouth daily as needed for moderate pain or cramping.    . meclizine (ANTIVERT) 25 MG tablet Take 1 tablet (25 mg total) by mouth 3 (three) times daily as needed for dizziness. 30 tablet 0  . naproxen sodium (ALEVE) 220 MG tablet Take 440 mg by mouth daily as needed (pain).    Marland Kitchen PE-DM-APAP & Doxylamin-DM-APAP (VICKS DAYQUIL/NYQUIL CLD & FLU) (Liquid) MISC Take 30 mLs by mouth 2 (two) times daily as needed (cough).     Social History   Socioeconomic History  . Marital status: Single    Spouse name: Not on file  . Number of children: Not on file  . Years of education: Not on file  . Highest education level: Not on file  Occupational History  . Not on file  Social Needs  . Financial resource strain: Not on file  . Food insecurity:    Worry: Often true    Inability: Often true  . Transportation needs:    Medical: Yes  Non-medical: Yes  Tobacco Use  . Smoking status: Never Smoker  . Smokeless tobacco: Never Used  Substance and Sexual Activity  . Alcohol use: Not Currently    Comment: rare  . Drug use: Yes    Frequency: 1.0 times per week    Types: Marijuana  . Sexual activity: Yes    Birth control/protection: Condom, Surgical  Lifestyle  . Physical activity:    Days per week: Not on file    Minutes per session: Not on file  . Stress: Not on file  Relationships  . Social connections:    Talks on phone: Not on file    Gets together: Not on file    Attends religious service: Not on file    Active member of club or organization: Not on file     Attends meetings of clubs or organizations: Not on file    Relationship status: Not on file  . Intimate partner violence:    Fear of current or ex partner: Not on file    Emotionally abused: Not on file    Physically abused: Not on file    Forced sexual activity: Not on file  Other Topics Concern  . Not on file  Social History Narrative  . Not on file   Family History  Problem Relation Age of Onset  . Cancer Father   . Hypertension Maternal Aunt   . Breast cancer Maternal Aunt   . Cancer Paternal Grandmother   . Breast cancer Paternal Grandmother   . Breast cancer Paternal Aunt     OBJECTIVE:  Vitals:   07/02/18 1547  BP: 118/68  Pulse: 94  Resp: 20  Temp: 98.4 F (36.9 C)  TempSrc: Oral  SpO2: 97%     General appearance: alert; appears fatigued, but nontoxic; speaking in full sentences and tolerating own secretions HEENT: NCAT; Ears: EACs clear, TMs pearly gray; Eyes: PERRL.  EOM grossly intact. Nose: nares patent without rhinorrhea, turbinates swollen and erythematous; Throat: oropharynx clear, tonsils non erythematous or enlarged, uvula midline  Neck: supple without LAD Lungs: rhonchi and wheezes heard throughout bilateral lung fields; moderate cough present; improvement following duo-neb treatment Heart: regular rate and rhythm.  Radial pulses 2+ symmetrical bilaterally Skin: warm and dry Psychological: alert and cooperative; normal mood and affect  DIAGNOSTIC STUDIES:  Dg Chest 2 View  Result Date: 07/02/2018 CLINICAL DATA:  Cough for 1 month. EXAM: CHEST - 2 VIEW COMPARISON:  None FINDINGS: The heart size and mediastinal contours are within normal limits. Both lungs are clear. The visualized skeletal structures are unremarkable. IMPRESSION: No active cardiopulmonary disease. Electronically Signed   By: Kerby Moors M.D.   On: 07/02/2018 16:29     ASSESSMENT & PLAN:  1. Mild intermittent asthma with acute exacerbation   2. Chronic hypertension during  pregnancy, antepartum     Meds ordered this encounter  Medications  . ipratropium-albuterol (DUONEB) 0.5-2.5 (3) MG/3ML nebulizer solution 3 mL  . predniSONE (DELTASONE) 20 MG tablet    Sig: Take 1 tablet (20 mg total) by mouth 2 (two) times daily with a meal for 5 days.    Dispense:  10 tablet    Refill:  0    Order Specific Question:   Supervising Provider    Answer:   Raylene Everts [[id number]]  . benzonatate (TESSALON) 100 MG capsule    Sig: Take 1 capsule (100 mg total) by mouth every 8 (eight) hours.    Dispense:  21 capsule  Refill:  0    Order Specific Question:   Supervising Provider    Answer:   Raylene Everts [[id number]]  . albuterol (PROVENTIL HFA;VENTOLIN HFA) 108 (90 Base) MCG/ACT inhaler 2 puff  . amLODipine (NORVASC) 5 MG tablet    Sig: Take 1 tablet (5 mg total) by mouth daily.    Dispense:  30 tablet    Refill:  1    Order Specific Question:   Supervising Provider    Answer:   Raylene Everts [[id number]]  . albuterol (PROVENTIL) (2.5 MG/3ML) 0.083% nebulizer solution    Sig: Take 3 mLs (2.5 mg total) by nebulization every 6 (six) hours as needed for wheezing or shortness of breath.    Dispense:  75 mL    Refill:  0    Order Specific Question:   Supervising Provider    Answer:   Raylene Everts [[id number]]   Chest x-ray did not show cardiopulmonary disease.  Symptoms possibly related to asthma flare.   Duo-neb given in office with relief Nebulizer given in office.  Albuterol solution prescribed.  Use as instructed Get plenty of rest and push fluids Tessalon Perles prescribed for cough Prednisone prescribed.  Take as directed and to completion Use albuterol inhaler as needed for shortness of breath and/ or wheezing Use OTC medications like ibuprofen or tylenol as needed fever or pain Follow up with PCP or with [facility] in the next few weeks for recheck and to ensure your symptoms are impoving Return or go to ER if you have any new or worsening  symptoms fever, chills, nausea, vomiting, chest pain, cough, shortness of breath, wheezing, abdominal pain, changes in bowel or bladder habits, etc...  Request blood pressure medication refills.     Reviewed expectations re: course of current medical issues. Questions answered. Outlined signs and symptoms indicating need for more acute intervention. Patient verbalized understanding. After Visit Summary given.         Lestine Box, PA-C 07/02/18 1752

## 2018-07-21 ENCOUNTER — Encounter: Payer: Self-pay | Admitting: Obstetrics and Gynecology

## 2018-07-21 ENCOUNTER — Telehealth: Payer: Self-pay | Admitting: Obstetrics and Gynecology

## 2018-07-21 ENCOUNTER — Other Ambulatory Visit: Payer: Self-pay

## 2018-07-21 ENCOUNTER — Ambulatory Visit (INDEPENDENT_AMBULATORY_CARE_PROVIDER_SITE_OTHER): Payer: Self-pay | Admitting: Obstetrics and Gynecology

## 2018-07-21 DIAGNOSIS — R102 Pelvic and perineal pain: Secondary | ICD-10-CM

## 2018-07-21 DIAGNOSIS — I10 Essential (primary) hypertension: Secondary | ICD-10-CM | POA: Insufficient documentation

## 2018-07-21 DIAGNOSIS — N809 Endometriosis, unspecified: Secondary | ICD-10-CM

## 2018-07-21 MED ORDER — NORETHIN ACE-ETH ESTRAD-FE 1-20 MG-MCG(24) PO TABS: 1.0000 | ORAL_TABLET | Freq: Every day | ORAL | 11 refills | Status: DC

## 2018-07-21 NOTE — Progress Notes (Signed)
3 cycles in February, 1st was mild but 2nd one was heavy.

## 2018-07-21 NOTE — Progress Notes (Signed)
Patient ID: Dana Hughes, female   DOB: 22-Jan-1981, 38 y.o.   MRN: [id number]   TELEHEALTH VIRTUAL GYNECOLOGY VISIT ENCOUNTER NOTE  I connected with [name] on 07/21/18 at  2:15 PM EDT by telephone at home and verified that I am speaking with the correct person using two identifiers.   I discussed the limitations, risks, security and privacy concerns of performing an evaluation and management service by telephone and the availability of in person appointments. I also discussed with the patient that there may be a patient responsible charge related to this service. The patient expressed understanding and agreed to proceed.   History:  Dana Hughes is a 38 y.o. [id number] female being evaluated today for follow up of her 05/15/18 appt. At that time she had what appeared to be chronic cystitis. She was treated with Macrobid. These Sx have resolved. However she had 3 cycles I Feb but she has been sick with exacerbation of her asthma. She reports the cycles were painful with cramps and clots. Just started her March cycle.     She completed her medication  For her BV and yeast and vaginal discharge has resolved. She has not followed up with [facility] about tubal reversal d/t to her illness and the Covid 19. But intends to reschedule when she can.   Past Medical History:  Diagnosis Date  . Anxiety   . Arthritis   . Asthma   . BV (bacterial vaginosis)   . Candidiasis   . Depression   . Headache(784.0)    migraines  . Hypertension    no longer takes meds  . Migraines   . Muscle spasm   . Ovarian cyst   . Scoliosis    Past Surgical History:  Procedure Laterality Date  . CHOLECYSTECTOMY    . TUBAL LIGATION     The following portions of the patient's history were reviewed and updated as appropriate: allergies, current medications, past family history, past medical history, past social history, past surgical history and problem list.   Health Maintenance:  Review of Systems:   Pertinent items noted in HPI and remainder of comprehensive ROS otherwise negative.  Physical Exam:  Physical exam deferred due to nature of the encounter  Labs and Imaging No results found for this or any previous visit (from the past 336 hour(s)). Dg Chest 2 View  Result Date: 07/02/2018 CLINICAL DATA:  Cough for 1 month. EXAM: CHEST - 2 VIEW COMPARISON:  None FINDINGS: The heart size and mediastinal contours are within normal limits. Both lungs are clear. The visualized skeletal structures are unremarkable. IMPRESSION: No active cardiopulmonary disease. Electronically Signed   By: Kerby Moors M.D.   On: 07/02/2018 16:29      Assessment and Plan:     1. Pelvic pain in female Discussed OCP use for cycle and pain control. U/R/B discussed. Pt verbalized understanding and desires to proceed with OCP use.Marland Kitchen Pt instructed to continue with her BP medication and follow up with her PCP.   - Norethindrone Acetate-Ethinyl Estrad-FE (LOESTRIN 24 FE) 1-20 MG-MCG(24) tablet; Take 1 tablet by mouth daily.  Dispense: 1 Package; Refill: 11  2. Endometriosis Still not certain if pt's Sx are related to endometriosis. Starting OCP's as above still should help - Norethindrone Acetate-Ethinyl Estrad-FE (LOESTRIN 24 FE) 1-20 MG-MCG(24) tablet; Take 1 tablet by mouth daily.  Dispense: 1 Package; Refill: 11  3. AUB  Suspect related to stress from recent illness but attempting to regulate with OCP's is  a good option.   F/U in 3 months      I discussed the assessment and treatment plan with the patient. The patient was provided an opportunity to ask questions and all were answered. The patient agreed with the plan and demonstrated an understanding of the instructions.   The patient was advised to call back or seek an in-person evaluation/go to the ED if the symptoms worsen or if the condition fails to improve as anticipated.  I provided 15 minutes of non-face-to-face time during this encounter.   Chancy Milroy, MD [facility] [place]

## 2018-07-21 NOTE — Telephone Encounter (Signed)
Called the patient to inform of the virtual visit due to the Center. She stated she just started her cycle and she experiences a lot of pain. The patient stated she will go over concerns with the nurse or doctor once she has the virtual visit.

## 2018-11-21 ENCOUNTER — Encounter

## 2019-01-13 ENCOUNTER — Ambulatory Visit (HOSPITAL_COMMUNITY)
Admission: EM | Admit: 2019-01-13 | Discharge: 2019-01-13 | Disposition: A | Discharge status: Home / Self Care | Payer: Medicaid Other | Attending: Emergency Medicine | Admitting: Emergency Medicine

## 2019-01-13 ENCOUNTER — Encounter (HOSPITAL_COMMUNITY): Payer: Self-pay

## 2019-01-13 ENCOUNTER — Other Ambulatory Visit: Payer: Self-pay

## 2019-01-13 DIAGNOSIS — G8929 Other chronic pain: Secondary | ICD-10-CM

## 2019-01-13 DIAGNOSIS — M25512 Pain in left shoulder: Secondary | ICD-10-CM

## 2019-01-13 DIAGNOSIS — R202 Paresthesia of skin: Secondary | ICD-10-CM

## 2019-01-13 MED ORDER — METHOCARBAMOL 500 MG PO TABS: 500.0000 mg | ORAL_TABLET | Freq: Two times a day (BID) | ORAL | 0 refills | Status: DC | PRN

## 2019-01-13 NOTE — Discharge Instructions (Addendum)
Take Tylenol or ibuprofen as needed for your discomfort.  Take the muscle relaxer as prescribed; do not drive, operate machinery, or drink alcohol with this medication as it may cause drowsiness.    Follow-up with the orthopedic listed or your preferred orthopedic.

## 2019-01-13 NOTE — ED Provider Notes (Signed)
[facility]    CSN: [id number] Arrival date & time: 01/13/19  1247      History   Chief Complaint Chief Complaint  Patient presents with  . Hand Pain    HPI Dana Hughes is a 38 y.o. female.   Patient presents with ongoing chronic bilateral hand pain and chronic left shoulder pain x several years.  She states she has been seen at multiple facilities over the last 3 years for her pain; she had a negative x-ray of her left shoulder on 08/28/2017.  She denies falls or injury.  She reports intermittent numbness in her bilateral arms and hands.  She also reports history of chronic back pain.  She has taken Tylenol and Aleve at home without relief.  LMP: Irregular, patient refuses pregnancy test today; states she has no possibility of pregnancy.    The history is provided by the patient.    Past Medical History:  Diagnosis Date  . Anxiety   . Arthritis   . Asthma   . BV (bacterial vaginosis)   . Candidiasis   . Depression   . Headache(784.0)    migraines  . Hypertension    no longer takes meds  . Migraines   . Muscle spasm   . Ovarian cyst   . Scoliosis     Patient Active Problem List   Diagnosis Date Noted  . Hypertension 07/21/2018  . Endometriosis 08/30/2017  . Pelvic pain in female 09/29/2015    Past Surgical History:  Procedure Laterality Date  . CHOLECYSTECTOMY    . TUBAL LIGATION      OB History    Gravida  4   Para  3   Term  3   Preterm  0   AB  1   Living  3     SAB  1   TAB  0   Ectopic  0   Multiple  0   Live Births  3        Obstetric Comments  SVD x 3         Home Medications    Prior to Admission medications   Medication Sig Start Date End Date Taking? Authorizing Provider  albuterol (PROVENTIL HFA;VENTOLIN HFA) 108 (90 Base) MCG/ACT inhaler Inhale 2 puffs into the lungs every 6 (six) hours as needed for wheezing or shortness of breath. Reported on 09/29/2015    [provider]  albuterol  (PROVENTIL) (2.5 MG/3ML) 0.083% nebulizer solution Take 3 mLs (2.5 mg total) by nebulization every 6 (six) hours as needed for wheezing or shortness of breath. 07/02/18   Wurst, [place], PA-C  amLODipine (NORVASC) 5 MG tablet Take 1 tablet (5 mg total) by mouth daily. Patient not taking: Reported on 07/21/2018 07/02/18   Wurst, [place], PA-C  artificial tears (LACRILUBE) OINT ophthalmic ointment Place into both eyes at bedtime as needed for dry eyes. 05/02/18   Langston Masker B, PA-C  benzonatate (TESSALON) 100 MG capsule Take 1 capsule (100 mg total) by mouth every 8 (eight) hours. Patient not taking: Reported on 07/21/2018 07/02/18   Wurst, [place], PA-C  ibuprofen (ADVIL,MOTRIN) 200 MG tablet Take 400 mg by mouth daily as needed for moderate pain or cramping.    [provider]  meclizine (ANTIVERT) 25 MG tablet Take 1 tablet (25 mg total) by mouth 3 (three) times daily as needed for dizziness. Patient not taking: Reported on 07/21/2018 05/06/18   Melynda Ripple, MD  methocarbamol (ROBAXIN) 500 MG tablet Take 1  tablet (500 mg total) by mouth 2 (two) times daily as needed for muscle spasms. 01/13/19   Sharion Balloon, NP  naproxen sodium (ALEVE) 220 MG tablet Take 440 mg by mouth daily as needed (pain).    [provider]  Norethindrone Acetate-Ethinyl Estrad-FE (LOESTRIN 24 FE) 1-20 MG-MCG(24) tablet Take 1 tablet by mouth daily. 07/21/18   Chancy Milroy, MD  PE-DM-APAP & Doxylamin-DM-APAP (VICKS DAYQUIL/NYQUIL CLD & FLU) (Liquid) MISC Take 30 mLs by mouth 2 (two) times daily as needed (cough).    [provider]    Family History Family History  Problem Relation Age of Onset  . Cancer Father   . Hypertension Maternal Aunt   . Breast cancer Maternal Aunt   . Cancer Paternal Grandmother   . Breast cancer Paternal Grandmother   . Breast cancer Paternal Aunt     Social History Social History   Tobacco Use  . Smoking status: Never Smoker  . Smokeless tobacco:  Never Used  Substance Use Topics  . Alcohol use: Not Currently    Comment: rare  . Drug use: Yes    Frequency: 1.0 times per week    Types: Marijuana     Allergies   Mushroom extract complex, Latex, and Penicillins   Review of Systems Review of Systems  Constitutional: Negative for chills and fever.  HENT: Negative for ear pain and sore throat.   Eyes: Negative for pain and visual disturbance.  Respiratory: Negative for cough and shortness of breath.   Cardiovascular: Negative for chest pain and palpitations.  Gastrointestinal: Negative for abdominal pain and vomiting.  Genitourinary: Negative for dysuria and hematuria.  Musculoskeletal: Positive for arthralgias and back pain.  Skin: Negative for color change and rash.  Neurological: Positive for numbness. Negative for seizures, syncope and weakness.  All other systems reviewed and are negative.    Physical Exam Triage Vital Signs ED Triage Vitals  Enc Vitals Group     BP 01/13/19 1302 126/79     Pulse Rate 01/13/19 1302 78     Resp 01/13/19 1302 17     Temp 01/13/19 1302 98.7 F (37.1 C)     Temp Source 01/13/19 1302 Oral     SpO2 01/13/19 1302 98 %     Weight --      Height --      Head Circumference --      Peak Flow --      Pain Score 01/13/19 1305 7     Pain Loc --      Pain Edu? --      Excl. in [place]? --    No data found.  Updated Vital Signs BP 126/79 (BP Location: Right Arm)   Pulse 78   Temp 98.7 F (37.1 C) (Oral)   Resp 17   SpO2 98%   Visual Acuity Right Eye Distance:   Left Eye Distance:   Bilateral Distance:    Right Eye Near:   Left Eye Near:    Bilateral Near:     Physical Exam Vitals signs and nursing note reviewed.  Constitutional:      General: She is not in acute distress.    Appearance: She is well-developed.  HENT:     Head: Normocephalic and atraumatic.  Eyes:     Conjunctiva/sclera: Conjunctivae normal.  Neck:     Musculoskeletal: Neck supple.  Cardiovascular:      Rate and Rhythm: Normal rate and regular rhythm.     Heart sounds:  No murmur.  Pulmonary:     Effort: Pulmonary effort is normal. No respiratory distress.     Breath sounds: Normal breath sounds.  Abdominal:     Palpations: Abdomen is soft.     Tenderness: There is no abdominal tenderness.  Musculoskeletal: Normal range of motion.        General: No swelling, tenderness, deformity or signs of injury.  Skin:    General: Skin is warm and dry.     Capillary Refill: Capillary refill takes less than 2 seconds.     Findings: No bruising, erythema, lesion or rash.  Neurological:     General: No focal deficit present.     Mental Status: She is alert and oriented to person, place, and time.     Sensory: No sensory deficit.     Motor: No weakness.     Coordination: Coordination normal.     Gait: Gait normal.     Deep Tendon Reflexes: Reflexes normal.      UC Treatments / Results  Labs (all labs ordered are listed, but only abnormal results are displayed) Labs Reviewed - No data to display  EKG   Radiology No results found.  Procedures Procedures (including critical care time)  Medications Ordered in UC Medications - No data to display  Initial Impression / Assessment and Plan / UC Course  I have reviewed the triage vital signs and the nursing notes.  Pertinent labs & imaging results that were available during my care of the patient were reviewed by me and considered in my medical decision making (see chart for details).    Chronic left shoulder pain and chronic paresthesias and pain in hands.  Instructed patient to continue to take Tylenol or ibuprofen as needed for her discomfort.  Additionally treating with Robaxin as needed.  Precautions for Robaxin discussed with patient.  Instructed patient to follow-up with an orthopedist to discuss her on going chronic pain and paresthesias.  Patient agrees to plan of care.     Final Clinical Impressions(s) / UC Diagnoses   Final  diagnoses:  Chronic left shoulder pain  Paresthesias     Discharge Instructions     Take Tylenol or ibuprofen as needed for your discomfort.  Take the muscle relaxer as prescribed; do not drive, operate machinery, or drink alcohol with this medication as it may cause drowsiness.    Follow-up with the orthopedic listed or your preferred orthopedic.        ED Prescriptions    Medication Sig Dispense Auth. Provider   methocarbamol (ROBAXIN) 500 MG tablet Take 1 tablet (500 mg total) by mouth 2 (two) times daily as needed for muscle spasms. 20 tablet Sharion Balloon, NP     I have reviewed the PDMP during this encounter.   Sharion Balloon, NP 01/13/19 1346

## 2019-01-13 NOTE — ED Triage Notes (Signed)
Pt presents with bilateral hand pain; pt states she has carpal tunnel and arthritis in both hands.  Pt states she also having pain issues with her left shoulder from unknown source.

## 2019-01-14 ENCOUNTER — Other Ambulatory Visit: Payer: Self-pay

## 2019-01-14 ENCOUNTER — Ambulatory Visit: Payer: Self-pay | Admitting: Internal Medicine

## 2019-01-14 ENCOUNTER — Encounter: Payer: Self-pay | Admitting: Internal Medicine

## 2019-01-14 VITALS — BP 121/91 | HR 82 | Temp 98.4°F | Ht 63.0 in | Wt 233.1 lb

## 2019-01-14 DIAGNOSIS — G5603 Carpal tunnel syndrome, bilateral upper limbs: Secondary | ICD-10-CM

## 2019-01-14 DIAGNOSIS — I10 Essential (primary) hypertension: Secondary | ICD-10-CM

## 2019-01-14 DIAGNOSIS — Z79899 Other long term (current) drug therapy: Secondary | ICD-10-CM

## 2019-01-14 MED ORDER — AMLODIPINE BESYLATE 5 MG PO TABS: 5.0000 mg | ORAL_TABLET | Freq: Every day | ORAL | 1 refills | Status: DC

## 2019-01-14 NOTE — Patient Instructions (Signed)
For the hand pain - take aleve or ibuprofen in the morning, then 4hrs later you can take tylenol, then 4 hrs after that aleve/ibuprofen again. And repeat this cycle throughout the day as needed. Don't take more than 2,400mg  ibuprofen or 3,000mg  tylenol in a day.

## 2019-01-14 NOTE — Progress Notes (Signed)
   CC: hand pain  HPI:  Dana Hughes is a 38 y.o. F with significant PMH as outlined below, who presents to clinic today for bilateral hand pain and to establish care. Please see problem-based charting for additional information.  Past Medical History:  Diagnosis Date  . Anxiety   . Arthritis   . Asthma   . BV (bacterial vaginosis)   . Candidiasis   . Depression   . Headache(784.0)    migraines  . Hypertension    no longer takes meds  . Migraines   . Muscle spasm   . Ovarian cyst   . Scoliosis    Social History: Never smoker. Drinks EtOH occasionally, approximately twice a month. Endorses marijuana use, and denies other substance use. Lives alone at home and works at a [employer].   Family History: History of HTN in multiple family members. Father and paternal grandfather with cancer. Aunt with thyroid dysfunction. No family history of SLE or rheumatoid arthritis.  Review of Systems:  Review of Systems  Constitutional: Negative for chills and fever.  Respiratory: Negative for shortness of breath.   Cardiovascular: Negative for chest pain.  Gastrointestinal: Negative for abdominal pain, nausea and vomiting.  Musculoskeletal: Positive for back pain, joint pain and neck pain.  Neurological: Negative for dizziness, sensory change, focal weakness and headaches.   Physical Exam:  Vitals:   01/14/19 1341  BP: (!) 121/91  Pulse: 82  Temp: 98.4 F (36.9 C)  TempSrc: Oral  SpO2: 100%  Weight: 233 lb 1.6 oz (105.7 kg)  Height: 5\' 3"  (1.6 m)   Physical Exam Vitals signs and nursing note reviewed.  Constitutional:      General: She is not in acute distress. Cardiovascular:     Rate and Rhythm: Normal rate and regular rhythm.     Heart sounds: Normal heart sounds.  Pulmonary:     Effort: Pulmonary effort is normal.     Breath sounds: Normal breath sounds.  Abdominal:     General: Bowel sounds are normal.     Palpations: Abdomen is soft.   Musculoskeletal:     Comments: Full ROM in bilateral hands. No deformities, tenderness to palpation, or muscle wasting.   Neurological:     Mental Status: She is alert.     Comments: Full strength and light-touch sensation in upper extremities bilaterally.    Assessment & Plan:   See Encounters Tab for problem based charting.  Patient seen with Dr. Lynnae January

## 2019-01-16 DIAGNOSIS — G5603 Carpal tunnel syndrome, bilateral upper limbs: Secondary | ICD-10-CM

## 2019-01-16 HISTORY — DX: Carpal tunnel syndrome, bilateral upper limbs: G56.03

## 2019-01-16 NOTE — Assessment & Plan Note (Signed)
Pt presents with bilateral hand pain. She states this is a chronic condition for her and that she was diagnosed with carpal tunnel syndrome approximately 2 years ago. She report bilateral numbness in the distribution of the median nerve. Denies ever having a nerve EMG. Does say she used to wear wrist splints, but does not currently have any. 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 She takes 440mg  Aleve in the morning, then 400mg  ibuprofen 6 hours later, and 1,000mg  Tylenol around bedtime. Pt stated she was worried about taking anything more over-the-counter. On exam, pt has no decreased sensation in hands bilaterally. No deformities, joint tenderness, or muscle wasting.  Assessment - Suspected bilateral carpal tunnel  Plan - ordered bilateral EMG study - pt awaiting call to follow-up with orthopedics - recommended wrist splinting and alternating tylenol with NSAID use throughout the day

## 2019-01-16 NOTE — Assessment & Plan Note (Signed)
Pt currently taking amlodipine 5mg  daily. Denies dizziness/lightheadedness or medication side effects. Blood pressure today with elevated diastolic pressure.  Assessment - Essential hypertension  Plan - continue amlodipine 5mg  daily - continue to monitor   BP Readings from Last 3 Encounters:  01/14/19 (!) 121/91  01/13/19 126/79  07/02/18 118/68

## 2019-01-19 NOTE — Progress Notes (Signed)
Internal Medicine Clinic Attending  I saw and evaluated the patient.  I personally confirmed the key portions of the history and exam documented by Dr. Jones and I reviewed pertinent patient test results.  The assessment, diagnosis, and plan were formulated together and I agree with the documentation in the resident's note.     

## 2019-02-03 ENCOUNTER — Telehealth: Payer: Self-pay

## 2019-02-03 NOTE — Telephone Encounter (Signed)
Patient called in to schedule f/u ov for endo biopsy due to AUB. Patient was scheduled for 02/20/19 - 9:15a w/ Dr. Rip Harbour.

## 2019-02-04 ENCOUNTER — Ambulatory Visit: Payer: Medicaid Other

## 2019-02-04 ENCOUNTER — Encounter: Payer: Self-pay | Admitting: General Practice

## 2019-02-20 ENCOUNTER — Ambulatory Visit (INDEPENDENT_AMBULATORY_CARE_PROVIDER_SITE_OTHER): Payer: Self-pay | Admitting: Obstetrics and Gynecology

## 2019-02-20 ENCOUNTER — Encounter: Payer: Self-pay | Admitting: Obstetrics and Gynecology

## 2019-02-20 ENCOUNTER — Other Ambulatory Visit: Payer: Self-pay

## 2019-02-20 VITALS — BP 131/79 | HR 66 | Temp 98.3°F | Ht 63.0 in | Wt 232.0 lb

## 2019-02-20 DIAGNOSIS — Z9851 Tubal ligation status: Secondary | ICD-10-CM | POA: Insufficient documentation

## 2019-02-20 DIAGNOSIS — N809 Endometriosis, unspecified: Secondary | ICD-10-CM

## 2019-02-20 DIAGNOSIS — N939 Abnormal uterine and vaginal bleeding, unspecified: Secondary | ICD-10-CM | POA: Insufficient documentation

## 2019-02-20 MED ORDER — DESOGESTREL-ETHINYL ESTRADIOL 0.15-30 MG-MCG PO TABS: 1.0000 | ORAL_TABLET | Freq: Every day | ORAL | 11 refills | Status: DC

## 2019-02-20 NOTE — Progress Notes (Signed)
Pt states pain while  having sex, also states clamps in tubes.

## 2019-02-20 NOTE — Progress Notes (Signed)
Patient ID: Dana Hughes, female   DOB: 09-04-1980, 38 y.o.   MRN: [id number] Ms [name] presents for follow up. See prior office notes.  Pt reports her cycles were still regular with the OCP's and stopped by 2 months. Continues to have irregular cycles and pain with intercourse at times. H/O endometriosis. Still desires pregnancy. Lost contact information for Dr Kerin Perna.  PE AF VSS Lungs clear Heart RRR Abd soft + BS obese   A/P AUB        ? Endometriosis        Desire for pregnancy  Discussed trying a different OCP to help regulate cycles and for endometriosis. Pt is agreeable. U/R/B reviewed with pt. To start this Sunday. Contact information for Dr Kerin Perna provided to pt. Her biggest desire is tubal reversal and pregnancy. F/U with me in 3 months

## 2019-02-20 NOTE — Patient Instructions (Signed)
Family Medicine at [facility]. [place], [place] Phone: [phone] M, W & F: 8:00am - 5:00pm

## 2019-03-02 ENCOUNTER — Ambulatory Visit: Payer: Medicaid Other

## 2019-03-02 ENCOUNTER — Encounter: Payer: Self-pay | Admitting: Internal Medicine

## 2019-05-27 ENCOUNTER — Telehealth (INDEPENDENT_AMBULATORY_CARE_PROVIDER_SITE_OTHER): Payer: Self-pay | Admitting: Obstetrics and Gynecology

## 2019-05-27 ENCOUNTER — Other Ambulatory Visit: Payer: Self-pay

## 2019-05-27 ENCOUNTER — Encounter: Payer: Self-pay | Admitting: Obstetrics and Gynecology

## 2019-05-27 DIAGNOSIS — R102 Pelvic and perineal pain: Secondary | ICD-10-CM

## 2019-05-27 DIAGNOSIS — N809 Endometriosis, unspecified: Secondary | ICD-10-CM

## 2019-05-27 MED ORDER — ORILISSA 150 MG PO TABS: 150.0000 mg | ORAL_TABLET | Freq: Every day | ORAL | 5 refills | Status: DC

## 2019-05-27 NOTE — Progress Notes (Signed)
Patient ID: Dana Hughes, female   DOB: Jan 29, 1981, 39 y.o.   MRN: [id number]    [place] VIRTUAL VIDEO VISIT ENCOUNTER NOTE  Provider location: [facility] [place]   I connected with Dana Hughes on 05/27/19 at  1:55 PM EST by MyChart Video Encounter at home and verified that I am speaking with the correct person using two identifiers.   I discussed the limitations, risks, security and privacy concerns of performing an evaluation and management service virtually and the availability of in person appointments. I also discussed with the patient that there may be a patient responsible charge related to this service. The patient expressed understanding and agreed to proceed.   History:  Dana Hughes is a 39 y.o. [phone] female being evaluated today for pelvic pain. She reports that the Apri did not help with her pain and her cycles lasted longer. She has now stopped. She has spoken with Dr Charlett Lango nurse and she has to loss weight before having her tubal reversal. Pregnancy is still her biggest desire. Last U/S was 4/19.    She has some breast concerns and is going to schedule a mammogram.   Past Medical History:  Diagnosis Date  . Anxiety   . Arthritis   . Asthma   . BV (bacterial vaginosis)   . Candidiasis   . Depression   . Headache(784.0)    migraines  . Hypertension    no longer takes meds  . Migraines   . Muscle spasm   . Ovarian cyst   . Scoliosis    Past Surgical History:  Procedure Laterality Date  . CHOLECYSTECTOMY    . TUBAL LIGATION     The following portions of the patient's history were reviewed and updated as appropriate: allergies, current medications, past family history, past medical history, past social history, past surgical history and problem list.   Health Maintenance:  Normal pap and negative HRHPV on 11/18.    Review of Systems:  Pertinent items noted in HPI and remainder of comprehensive ROS otherwise  negative.  Physical Exam:   General:  Alert, oriented and cooperative. Patient appears to be in no acute distress.  Mental Status: Normal mood and affect. Normal behavior. Normal judgment and thought content.   Respiratory: Normal respiratory effort, no problems with respiration noted  Rest of physical exam deferred due to type of encounter  Labs and Imaging No results found for this or any previous visit (from the past 336 hour(s)). No results found.     Assessment and Plan:     1. Pelvic pain in female  - US PELVIC COMPLETE WITH TRANSVAGINAL; Future - Elagolix Sodium (ORILISSA) 150 MG TABS; Take 150 mg by mouth daily.  Dispense: 30 tablet; Refill: 5  2. Endometriosis  - US PELVIC COMPLETE WITH TRANSVAGINAL; Future - Elagolix Sodium (ORILISSA) 150 MG TABS; Take 150 mg by mouth daily.  Dispense: 30 tablet; Refill: 5    Pt encouraged to continue to work on wt loss. Will check GYN U/S. Start [facility]. U/R/B reviewed with pt.   I discussed the assessment and treatment plan with the patient. The patient was provided an opportunity to ask questions and all were answered. The patient agreed with the plan and demonstrated an understanding of the instructions.   The patient was advised to call back or seek an in-person evaluation/go to the ED if the symptoms worsen or if the condition fails to improve as anticipated.  I provided 8  minutes of face-to-face time during this encounter.   Chancy Milroy, MD [facility], [place]

## 2019-05-27 NOTE — Progress Notes (Signed)
I connected with  Dana Hughes on 05/27/19 at  1:55 PM EST by telephone and verified that I am speaking with the correct person using two identifiers.   I discussed the limitations, risks, security and privacy concerns of performing an evaluation and management service by telephone and the availability of in person appointments. I also discussed with the patient that there may be a patient responsible charge related to this service. The patient expressed understanding and agreed to proceed.  [place], CMA 05/27/2019  1:49 PM   BC made cycle worse and last longer not helping.  sharp pains on left and right sides of uterus feels worse than labor pains. Didn't wanna go to ER because of covid.   Feeling knots in breast -will schedule mammogram  Needs refill of amlodipine and robaxin  Clamps from a tubal 15 yrs ago shifted to gallbladder?? Wants that checked to be sure that its not the cause of her pain.

## 2019-05-29 ENCOUNTER — Telehealth (INDEPENDENT_AMBULATORY_CARE_PROVIDER_SITE_OTHER): Payer: Self-pay | Admitting: Obstetrics and Gynecology

## 2019-05-29 ENCOUNTER — Encounter: Payer: Self-pay | Admitting: *Deleted

## 2019-05-29 ENCOUNTER — Telehealth: Payer: Self-pay | Admitting: Internal Medicine

## 2019-05-29 DIAGNOSIS — N939 Abnormal uterine and vaginal bleeding, unspecified: Secondary | ICD-10-CM

## 2019-05-29 NOTE — Telephone Encounter (Signed)
Called and spoke with pt and she reports it is her Amlodipine and Robaxin. Informed pt that those medications are prescribed through her PCP. Phone number for [facility] given for pt to call and discuss with PCP.

## 2019-05-29 NOTE — Telephone Encounter (Signed)
Requesting refill request on blood pressure medicines and muscle relaxer .  Did confirm that  amLODipine (NORVASC) 5 MG tablet was one of the meds.  But she was not able to give me the name of the second bp med or the muscle relaxer.  Telephone number and pharmacy on file are correct please call her with any questions.

## 2019-05-29 NOTE — Telephone Encounter (Signed)
Patient is requesting a call back. She says her Rx was not called in, and wants to know what happened.

## 2019-05-29 NOTE — Telephone Encounter (Signed)
Per chart, LOV was 01/14/19, pt had 2 no-shows since LOV, no pending appt's.  Robaxin was prescribed per urgent care in 12/2018.  Pt called and notified she will need to be seen for refills.  Appt made for 06/01/18 @ 0945/ACC. SChaplin, RN,BSN

## 2019-06-02 ENCOUNTER — Ambulatory Visit: Payer: Medicaid Other

## 2019-06-03 ENCOUNTER — Other Ambulatory Visit: Payer: Self-pay

## 2019-06-03 ENCOUNTER — Ambulatory Visit (HOSPITAL_COMMUNITY)
Admission: RE | Admit: 2019-06-03 | Discharge: 2019-06-03 | Disposition: A | Discharge status: Home / Self Care | Payer: Self-pay | Source: Ambulatory Visit | Attending: Obstetrics and Gynecology | Admitting: Obstetrics and Gynecology

## 2019-06-03 DIAGNOSIS — N809 Endometriosis, unspecified: Secondary | ICD-10-CM | POA: Insufficient documentation

## 2019-06-03 DIAGNOSIS — R102 Pelvic and perineal pain: Secondary | ICD-10-CM | POA: Insufficient documentation

## 2019-06-04 ENCOUNTER — Ambulatory Visit: Payer: Medicaid Other

## 2019-06-08 ENCOUNTER — Telehealth: Payer: Self-pay | Admitting: Obstetrics and Gynecology

## 2019-06-08 NOTE — Telephone Encounter (Signed)
Dana Hughes called today stating she was still in very much pain, and would like to come in the office to see Dr Rip Harbour. She also stated she needed to speak to someone about her results because she saw the ultrasound, and it looks like she may be pregnant. She is requesting a call back because she has questions.

## 2019-06-08 NOTE — Telephone Encounter (Signed)
Called pt and pt informed me that she is having so much pain that she is balled over.  Pt reports that Tylenol and ibuprofen is not effective.  Pt states that her cousin told her that it could be an ectopic pregnancy.  I explained to the pt that Dr. Rip Harbour needs to review her results however I do not see that her US shows that she is pregnant.  I veified with pt if she has taken the [facility] that Dr. Rip Harbour prescribed.  Pt states that she has not started taken it because when she went to the pharamacy she was informed that they did have a medicine for her.  I advised pt that I would call her back later today due to [place] phone line giving message "sorry we are experiencing difficulty please call back at a later time".  Pt verbalized understanding.

## 2019-06-10 ENCOUNTER — Ambulatory Visit: Payer: Medicaid Other

## 2019-06-15 ENCOUNTER — Ambulatory Visit: Payer: Medicaid Other

## 2019-06-16 ENCOUNTER — Ambulatory Visit: Payer: Medicaid Other

## 2019-06-16 ENCOUNTER — Telehealth: Payer: Self-pay | Admitting: Student

## 2019-06-16 NOTE — Telephone Encounter (Signed)
The patient would like some pain medication sent to the pharmacy see stated the pharmacy told her it was in the process of being sent however was never completed.

## 2019-06-17 ENCOUNTER — Telehealth: Payer: Self-pay | Admitting: General Practice

## 2019-06-17 DIAGNOSIS — N809 Endometriosis, unspecified: Secondary | ICD-10-CM

## 2019-06-17 MED ORDER — IBUPROFEN 800 MG PO TABS: 800.0000 mg | ORAL_TABLET | Freq: Three times a day (TID) | ORAL | 0 refills | Status: DC | PRN

## 2019-06-17 NOTE — Telephone Encounter (Signed)
Called [facility] [employer] regarding [place] prescription needing prior auth at pharmacy. [place] [facility] states Medicaid does not cover Freida Busman and a prior auth isn't required.  Called patient, no answer- unable to leave message as voicemail was full. Will send mychart message and note to Dr Rip Harbour for alternatives.

## 2019-06-17 NOTE — Telephone Encounter (Signed)
Patient called into office stating she is returning our phone call. Discussed Dana Hughes is not covered by Medicaid and a message has been sent to Dr Rip Harbour for recommendations/alternatives. Discussed Ibuprofen Rx could be sent in in the meantime. Patient verbalized understanding and would like that. Told patient we will call her back whenever we hear back from Dr Rip Harbour. Patient verbalized understanding. Ibuprofen Rx sent in per protocol.

## 2019-06-17 NOTE — Telephone Encounter (Signed)
We can try Depo Lupron if pt desires.  Dana Hughes

## 2019-06-17 NOTE — Telephone Encounter (Signed)
Called patient and discussed Dr Rip Harbour suggested Depo Lupron as an alternative. Told patient I would send her a mychart message with more information on the injections. Asked patient about insurance status. She states she currently has [facility] medicaid but it is supposed to be switched over to regular. Discussed medicaid wouldn't cover Lupron so she would have to wait until insurance is switched over. Patient verbalized understanding and had no questions.

## 2019-06-29 ENCOUNTER — Other Ambulatory Visit: Payer: Self-pay

## 2019-06-29 ENCOUNTER — Encounter: Payer: Self-pay | Admitting: Obstetrics and Gynecology

## 2019-06-29 ENCOUNTER — Ambulatory Visit (INDEPENDENT_AMBULATORY_CARE_PROVIDER_SITE_OTHER): Payer: Self-pay | Admitting: Obstetrics and Gynecology

## 2019-06-29 VITALS — BP 116/88 | HR 75 | Ht 62.5 in | Wt 232.8 lb

## 2019-06-29 DIAGNOSIS — F32A Depression, unspecified: Secondary | ICD-10-CM

## 2019-06-29 DIAGNOSIS — R102 Pelvic and perineal pain: Secondary | ICD-10-CM

## 2019-06-29 DIAGNOSIS — N809 Endometriosis, unspecified: Secondary | ICD-10-CM

## 2019-06-29 DIAGNOSIS — F329 Major depressive disorder, single episode, unspecified: Secondary | ICD-10-CM

## 2019-06-29 MED ORDER — NORETHINDRONE 0.35 MG PO TABS: 1.0000 | ORAL_TABLET | Freq: Every day | ORAL | 11 refills | Status: DC

## 2019-06-29 NOTE — Progress Notes (Signed)
Patient ID: Dana Hughes, female   DOB: 1980-05-17, 39 y.o.   MRN: [id number] Ms Eccleston is here for follow up of her endometriosis and pelvic pain. Unable to afford [place]. Has tried combination OCP's x 2 without much help. Still desires tubal reversal and pregnancy. Working on wt loss as per Dr Kerin Perna before consideration for surgery.  Just completed cycle. Pain is daily, some days worst than others.  U/S 2 small fibroids and ovarian follicles.   PE AF VSS Lungs clear Heart RRR Abd soft + BS GU Nl EGBUS min right adnexal tenderness on today's exam  A/P Endometriosis        Pelvic pain due to above.  Pt's main desires are tubal reversal and pregnancy. Working with Dr Elberta Leatherwood on this. Discussed POP vs Depo Provera. Desires to try POP. U/R/B, to start today. F/U in 3 months

## 2019-06-29 NOTE — Progress Notes (Signed)
Pt scored 22 on PHQ & 19 on GAD-7,sent referral to [facility].

## 2019-06-29 NOTE — Patient Instructions (Signed)
Endometriosis  Endometriosis is a condition in which the tissue that lines the uterus (endometrium) grows outside of its normal location. The tissue may grow in many locations close to the uterus, but it commonly grows on the ovaries, fallopian tubes, vagina, or bowel. When the uterus sheds the endometrium every menstrual cycle, there is bleeding wherever the endometrial tissue is located. This can cause pain because blood is irritating to tissues that are not normally exposed to it. What are the causes? The cause of endometriosis is not known. What increases the risk? You may be more likely to develop endometriosis if you:  Have a family history of endometriosis.  Have never given birth.  Started your period at age 10 or younger.  Have high levels of estrogen in your body.  Were exposed to a certain medicine (diethylstilbestrol) before you were born (in utero).  Had low birth weight.  Were born as a twin, triplet, or other multiple.  Have a BMI of less than 25. BMI is an estimate of body fat and is calculated from height and weight. What are the signs or symptoms? Often, there are no symptoms of this condition. If you do have symptoms, they may:  Vary depending on where your endometrial tissue is growing.  Occur during your menstrual period (most common) or midcycle.  Come and go, or you may go months with no symptoms at all.  Stop with menopause. Symptoms may include:  Pain in the back or abdomen.  Heavier bleeding during periods.  Pain during sex.  Painful bowel movements.  Infertility.  Pelvic pain.  Bleeding more than once a month. How is this diagnosed? This condition is diagnosed based on your symptoms and a physical exam. You may have tests, such as:  Blood tests and urine tests. These may be done to help rule out other possible causes of your symptoms.  Ultrasound, to look for abnormal tissues.  An X-ray of the lower bowel (barium enema).  An  ultrasound that is done through the vagina (transvaginally).  CT scan.  MRI.  Laparoscopy. In this procedure, a lighted, pencil-sized instrument called a laparoscope is inserted into your abdomen through an incision. The laparoscope allows your health care provider to look at the organs inside your body and check for abnormal tissue to confirm the diagnosis. If abnormal tissue is found, your health care provider may remove a small piece of tissue (biopsy) to be examined under a microscope. How is this treated? Treatment for this condition may include:  Medicines to relieve pain, such as NSAIDs.  Hormone therapy. This involves using artificial (synthetic) hormones to reduce endometrial tissue growth. Your health care provider may recommend using a hormonal form of birth control, or other medicines.  Surgery. This may be done to remove abnormal endometrial tissue. ? In some cases, tissue may be removed using a laparoscope and a laser (laparoscopic laser treatment). ? In severe cases, surgery may be done to remove the fallopian tubes, uterus, and ovaries (hysterectomy). Follow these instructions at home:  Take over-the-counter and prescription medicines only as told by your health care provider.  Do not drive or use heavy machinery while taking prescription pain medicine.  Try to avoid activities that cause pain, including sexual activity.  Keep all follow-up visits as told by your health care provider. This is important. Contact a health care provider if:  You have pain in the area between your hip bones (pelvic area) that occurs: ? Before, during, or after your period. ?   In between your period and gets worse during your period. ? During or after sex. ? With bowel movements or urination, especially during your period.  You have problems getting pregnant.  You have a fever. Get help right away if:  You have severe pain that does not get better with medicine.  You have severe  nausea and vomiting, or you cannot eat without vomiting.  You have pain that affects only the lower, right side of your abdomen.  You have abdominal pain that gets worse.  You have abdominal swelling.  You have blood in your stool. This information is not intended to replace advice given to you by your health care provider. Make sure you discuss any questions you have with your health care provider. Document Revised: 03/22/2017 Document Reviewed: 09/10/2015 Elsevier Patient Education  2020 Elsevier Inc.  

## 2019-07-02 NOTE — Telephone Encounter (Signed)
Pt concern addressed in another encounter. ? ?Nyelah Emmerich,RN  ?

## 2019-07-27 NOTE — BH Specialist Note (Signed)
Pt requesting to reschedule to Monday, April 12 at 10:45am  [place] via Telemedicine Video Visit  07/27/2019 Dana Hughes [id number]   Garlan Fair

## 2019-07-28 ENCOUNTER — Telehealth: Payer: Self-pay | Admitting: General Practice

## 2019-07-28 ENCOUNTER — Ambulatory Visit: Payer: Self-pay | Admitting: Clinical

## 2019-07-28 NOTE — Telephone Encounter (Signed)
Pt can come to the office for Depo Provera injection at her convince. Stop POP which she receives injection.  Thanks Legrand Como

## 2019-07-28 NOTE — Telephone Encounter (Signed)
Received notice from Roselyn Reef [facility] that patient had questions about an injection.   Called patient and she states she has been taking the pills since her last appointment but they are not helping her pain. Patient is taking ibuprofen as well but it helps minimally. She is wondering if she can try the depo provera injections instead. Told patient I would reach out to Dr Rip Harbour and we would get back in touch with her whenever we hear from him. Patient verbalized understanding.

## 2019-07-29 NOTE — Telephone Encounter (Signed)
Called patient and informed her Dr Rip Harbour was okay with her starting the depo provera injections and she can stop her pills the day she gets her injection. Discussed someone from our front office will call her with an appt. Patient verbalized understanding & had no questions.

## 2019-07-30 NOTE — BH Specialist Note (Signed)
Integrated Behavioral Health via Telemedicine Video Visit  07/30/2019 [name] [id number]  Number of [place] visits: 1 Session Start time: 10:49  Session End time: 11:41 Total time: 71  Referring Provider: Arlina Robes, MD Type of Visit: Video Patient/Family location: Home [facility] Provider location: [facility] All persons participating in visit: Patient Dana Hughes and [place]    Confirmed patient's address: Yes  Confirmed patient's phone number: Yes  Any changes to demographics: No   Confirmed patient's insurance: Yes  Any changes to patient's insurance: No   Discussed confidentiality: Yes   I connected with Dana Hughes  by a video enabled telemedicine application and verified that I am speaking with the correct person using two identifiers.     I discussed the limitations of evaluation and management by telemedicine and the availability of in person appointments.  I discussed that the purpose of this visit is to provide behavioral health care while limiting exposure to the novel coronavirus.   Discussed there is a possibility of technology failure and discussed alternative modes of communication if that failure occurs.  I discussed that engaging in this video visit, they consent to the provision of behavioral healthcare and the services will be billed under their insurance.  Patient and/or legal guardian expressed understanding and consented to video visit: Yes   PRESENTING CONCERNS: Patient and/or family reports the following symptoms/concerns: Pt states her primary concern today is ongoing and increasing pelvic pain/endometriosis, hypertension, life stress, depression, anxiety, insomnia. Pt copes by tending to children and mild marijuana use, and used to use sleep sounds with success; requests self-coping strategies.  Duration of problem: Ongoing; Severity of problem: severe  STRENGTHS (Protective Factors/Coping Skills): Resilience;  open to treatment  GOALS ADDRESSED: Patient will: 1.  Reduce symptoms of: anxiety, depression, insomnia and stress  2.  Increase knowledge and/or ability of: healthy habits and self-management skills  3.  Demonstrate ability to: Increase healthy adjustment to current life circumstances, Increase adequate support systems for patient/family, Decrease self-medicating behaviors and Continued healthy grieving over loss  INTERVENTIONS: Interventions utilized:  Mindfulness or Psychologist, educational, Sleep Hygiene, Psychoeducation and/or Health Education and Link to [employer] Standardized Assessments completed: PHQ9/GAD7 used in past two weeks  ASSESSMENT: Patient currently experiencing Adjustment disorder with mixed anxiety and depressed mood, Grief, and Psychosocial stress.   Patient may benefit from psychoeducation and brief therapeutic interventions regarding coping with symptoms of depression, anxiety, chronic pain and life stress .  PLAN: 1. Follow up with behavioral health clinician on : Two weeks 2. Behavioral recommendations:  -Make sure voicemail is not full, to receive call-back for upcoming depo shot schedule -CALM relaxation breathing exercise twice daily (morning; at bedtime with sleep sounds) -Consider apps as additional self-coping/distraction -Consider 24 hour crisis lines if unable to sleep for over 24 hours, as needed  3. Referral(s): [facility] [place] (In Clinic) and [employer]:  grief support  I discussed the assessment and treatment plan with the patient and/or parent/guardian. They were provided an opportunity to ask questions and all were answered. They agreed with the plan and demonstrated an understanding of the instructions.   They were advised to call back or seek an in-person evaluation if the symptoms worsen or if the condition fails to improve as anticipated.  Dana Hughes  Depression screen [facility] 2/9 06/29/2019 01/14/2019  08/30/2017  Decreased Interest 3 0 1  Down, Depressed, Hopeless 3 1 2   PHQ - 2 Score 6 1 3  Altered sleeping 3 3 3   Tired, decreased energy 3 3 3   Change in appetite 3 3 3   Feeling bad or failure about yourself  3 0 2  Trouble concentrating 3 0 1  Moving slowly or fidgety/restless 1 0 2  Suicidal thoughts 0 0 0  PHQ-9 Score 22 10 17   Difficult doing work/chores - Very difficult -   GAD 7 : Generalized Anxiety Score 06/29/2019 08/30/2017  Nervous, Anxious, on Edge 1 2  Control/stop worrying 3 3  Worry too much - different things 3 3  Trouble relaxing 3 3  Restless 3 2  Easily annoyed or irritable 3 3  Afraid - awful might happen 3 2  Total GAD 7 Score 19 18

## 2019-08-03 ENCOUNTER — Ambulatory Visit (INDEPENDENT_AMBULATORY_CARE_PROVIDER_SITE_OTHER): Payer: Self-pay | Admitting: Clinical

## 2019-08-03 ENCOUNTER — Other Ambulatory Visit: Payer: Self-pay

## 2019-08-03 DIAGNOSIS — F4323 Adjustment disorder with mixed anxiety and depressed mood: Secondary | ICD-10-CM

## 2019-08-03 DIAGNOSIS — Z658 Other specified problems related to psychosocial circumstances: Secondary | ICD-10-CM

## 2019-08-03 NOTE — Patient Instructions (Addendum)
[facility] (Individual and group grief support)   [facility]  2703016407  What if I or someone I know is in crisis?  . If you are thinking about harming yourself or having thoughts of suicide, or if you know someone who is, seek help right away.  . Call your doctor or mental health care provider.  . Call 911 or go to a hospital emergency room to get immediate help, or ask a friend or family member to help you do these things.  . Call the [place] National Suicide Prevention Lifeline's toll-free, 24-hour hotline at 1-800-273-TALK 234 489 7920) or TTY: 1-800-799-4 TTY 604-436-1094) to talk to a trained counselor.  . If you are in crisis, make sure you are not left alone.   . If someone else is in crisis, make sure he or she is not left alone   24 Hour :   [place] National Suicide Hotline: (856)041-1567  Therapeutic Alternative Mobile Crisis: (681)356-4173   [facility]  [address], [place], [place] [address]  6081866160 or (201)726-7120  [facility] [employer] (Domestic Violence, Rape & Victim Assistance)  2206989694  [place]  [address],   [address]   [phone] or [phone]   [place]: [phone] (8am-4pm) or [phone] (after hours)           [facility], [address] [place], [place] Fax: [phone] www.[email or web address]  *Interpreters available *Accepts Medicaid, Medicare, uninsured  [facility]   Mon-Fri: 8am-5pm [address] [place], [place] [phone](phone); [phone]) BloggerCourse.com  *Accepts Medicare  [facility] [place], Fri: 8am-4pm [address] [place], [place] (phone); [phone] (fax) TaskTown.es   *[place] Mon-Fri: 9am-5pm  [address], [place], [place] (phone); [phone]  [email or web address]  *Accepts Medicaid  [facility] [address] [place], [place]  SalonLookup.es   [facility] [place], 8:30am-12pm/1pm-2:30pm [address], [place], [place] (phone); [phone] (fax) www.fspcares.org  *Accepts Medicaid, sliding-scale*Bilingual services available  [facility] Mon-Fri, 8am-7pm [place], [place]  [phone](phone); [phone]) www.[email or web address]  *Accepts Medicaid *Bilingual services available  [facility] Mon-Fri: 8am-5pm, Saturday by appointment only [place], [place], [place] (phone); [phone] (fax) [email or web address]   [facility] [address], [place], [place], [place] [email or web address]  *Free & reduced services for uninsured and underinsured individuals *Bilingual services for Spanish-speaking clients 21 and under  [facility], [address] [place], [place]); [phone]) RunningConvention.de  *Bring your own interpreter at first visit *Accepts Medicare and [facility]  [place] Mon-Fri: 9am-5:30pm [address], [place], [place], [place] (phone), [phone] (fax) After hours crisis line: [phone] www.[email or web address]  *Accepts Medicare and Medicaid  [facility], 8am-6pm [address] [place], [place] (phone); [phone] (fax) http://[email or web address]  *Subsidized costs available  [facility] Mon-Fri: 8am-4pm [address] [place], [place] [phone](phone); [phone]) www.psychotherapeuticservices.com  *Accepts Medicaid  [facility] Same day access hours: Mon-Fri, 8:30-3pm Crisis hours: Mon-Fri, 8am-5pm [place], [place] Same day access hours: Mon-Fri, 8:30-3pm Crisis hours: Mon-Fri, 8am-8pm [address] [place], [place] (phone); [phone] (fax) www.[email or web address]  *Accepts Medicaid and Medicare  [facility], [place], Fri: 9am-9pm  Tues, Thurs: 9am-6pm [place], [place], [place] (phone); [phone] (fax) https://[email or web address]  *(Accepts Medicare and Medicaid; payment plans available)*Bilingual services available  [facility] [address], [place], [place] (phone); [phone] (fax) www.[email or web address] [address], [place], [place], [place]  [email or web address].[email or web address]  *Bilingual services available  [facility] (Social [facility] Emotional Learning) Mon-Thurs: 8am-8pm [address] [place], [place], [place] (phone); [phone] (fax) [email or web address].[email or web address]  *Accepts Medicaid*Bilingual services available  [place] [address], [place], [place], [place] (phone) DeadConnect.com.cy  *Accepts Medicaid *Bilingual services available  [facility] Mon-Fri, 9am-4:45pm [address] [place], [place] (phone); [phone] (fax) http://[email or web address]  *Accepts Medicare  [facility] Mon-Thurs: 8:30-8pm, Fri: 8:30am-7pm [address] [place], [place] (3rd floor) [phone] (phone); [phone] (fax) VIPinterview.si  *Accepts Medicaid; income-based reduced rates available  [facility] Mon-Fri: 8am-5pm [address],  [place], [place], [place] (phone); [phone] (fax) [email or web address]://[email or web address]  *Accepts Medicaid*Bilingual services available  [place], [place], [place], [place] (phone); [phone] (fax) www.[email or web address]  *Free emergency housing and clinical services for youth in crisis  [facility] ([place])  [address] [place] [phone] www.mhag.org  *Provides direct services to individuals in recovery from mental illness, including support groups, recovery skills classes, and one on one peer support  [facility] [employer] on [address]) [place] helpline: (726)363-6384  https://namiguilford.org  *A community hub for information relating to local resources and services for the friends and families of individuals living alongside a mental health condition, as well as the individuals themselves. Classes and support groups also provided    /Emotional [employer] and Websites Here are a few free apps meant to help you to help yourself.  To find, try searching on the internet to see if the app is offered on Apple/Android devices. If your first choice doesn't come up on your device, the good news is that there are many choices! Play around with different apps to see which ones are helpful to you.    Calm This is an app meant to help increase calm feelings. Includes info, strategies, and tools for tracking your feelings.      Calm Harm  This app is meant to help with self-harm. Provides many 5-minute or 15-min coping strategies for doing instead of hurting yourself.       [place] is a problem-solving tool to help deal with emotions and cope with stress you encounter wherever you are.      MindShift This app can help people cope with anxiety. Rather than trying to avoid anxiety, you can make an important shift and face it.      MY3  MY3 features a support  system, safety plan and resources with the goal of offering a tool to use in a time of need.       My Life My Voice  This mood journal offers a simple solution for tracking your thoughts, feelings and moods. Animated emoticons can help identify your mood.       Relax Melodies Designed to help with sleep, on this app you can mix sounds and meditations for relaxation.      Smiling Mind Smiling Mind is meditation made easy: it's a simple tool that helps put a smile on your mind.        Stop, Breathe & Think  A friendly, simple guide for people  through meditations for mindfulness and compassion.  Stop, Breathe and Think Kids Enter your current feelings and choose a "mission" to help you cope. Offers videos for certain moods instead of just sound recordings.       Team Orange The goal of this tool is to help teens change how they think, act, and react. This app helps you focus on your own good feelings and experiences.      The [place] Box The [place] Box (VHB) contains simple tools to help patients with coping, relaxation, distraction, and positive thinking.

## 2019-08-06 NOTE — BH Specialist Note (Signed)
Pt did not arrive to video visit and did not answer the phone Left HIPPA-compliant message to call back Roselyn Reef from [facility] at [phone].  ; left MyChart message for patient.   Integrated Behavioral Health via Telemedicine Video Visit  08/06/2019 KARMYN MOUSE [id number]  Garlan Fair

## 2019-08-12 ENCOUNTER — Encounter: Payer: Self-pay | Admitting: *Deleted

## 2019-08-13 ENCOUNTER — Other Ambulatory Visit: Payer: Self-pay

## 2019-08-13 ENCOUNTER — Ambulatory Visit (INDEPENDENT_AMBULATORY_CARE_PROVIDER_SITE_OTHER): Payer: Self-pay | Admitting: *Deleted

## 2019-08-13 VITALS — BP 126/85 | HR 97

## 2019-08-13 DIAGNOSIS — Z30013 Encounter for initial prescription of injectable contraceptive: Secondary | ICD-10-CM

## 2019-08-13 DIAGNOSIS — Z3202 Encounter for pregnancy test, result negative: Secondary | ICD-10-CM

## 2019-08-13 LAB — POCT PREGNANCY, URINE: Preg Test, Ur: NEGATIVE

## 2019-08-13 MED ORDER — MEDROXYPROGESTERONE ACETATE 150 MG/ML IM SUSP
150.0000 mg | INTRAMUSCULAR | Status: DC
  Administered 2019-08-13 – 2020-04-04 (×3): 150 mg via INTRAMUSCULAR

## 2019-08-13 NOTE — Progress Notes (Signed)
Here for initial  depoprovera today. Upt negative and has been on ocp's. Given first dose and tolerated without complaints. To check out at front and make appointment for 12 weeks. She voices understanding. Lianna Sitzmann,RN

## 2019-08-13 NOTE — Progress Notes (Signed)
Agree with A & P. 

## 2019-08-17 ENCOUNTER — Ambulatory Visit: Payer: Medicaid Other | Admitting: Clinical

## 2019-08-17 DIAGNOSIS — Z5329 Procedure and treatment not carried out because of patient's decision for other reasons: Secondary | ICD-10-CM

## 2019-08-17 DIAGNOSIS — Z91199 Patient's noncompliance with other medical treatment and regimen due to unspecified reason: Secondary | ICD-10-CM

## 2019-08-26 ENCOUNTER — Other Ambulatory Visit: Payer: Self-pay

## 2019-08-26 ENCOUNTER — Telehealth: Payer: Self-pay | Admitting: *Deleted

## 2019-08-26 ENCOUNTER — Ambulatory Visit (HOSPITAL_COMMUNITY)
Admission: EM | Admit: 2019-08-26 | Discharge: 2019-08-26 | Disposition: A | Discharge status: Home / Self Care | Payer: Self-pay | Attending: Family Medicine | Admitting: Family Medicine

## 2019-08-26 ENCOUNTER — Encounter (HOSPITAL_COMMUNITY): Payer: Self-pay | Admitting: Emergency Medicine

## 2019-08-26 DIAGNOSIS — M545 Low back pain, unspecified: Secondary | ICD-10-CM

## 2019-08-26 LAB — POCT URINALYSIS DIP (DEVICE)
Bilirubin Urine: NEGATIVE
Glucose, UA: NEGATIVE mg/dL
Ketones, ur: NEGATIVE mg/dL
Leukocytes,Ua: NEGATIVE
Nitrite: NEGATIVE
Protein, ur: NEGATIVE mg/dL
Specific Gravity, Urine: >=1.03 (ref 1.005–1.030)
Urobilinogen, UA: 0.2 mg/dL (ref 0.0–1.0)
pH: 6 (ref 5.0–8.0)

## 2019-08-26 MED ORDER — CYCLOBENZAPRINE HCL 10 MG PO TABS: 10.0000 mg | ORAL_TABLET | Freq: Two times a day (BID) | ORAL | 0 refills | Status: DC | PRN

## 2019-08-26 NOTE — Discharge Instructions (Addendum)
Please try heat  Please try ibuprofen and the muscle relaxer  Please follow up if your symptoms worsen or fail to improve.

## 2019-08-26 NOTE — ED Provider Notes (Signed)
Dana Hughes    CSN: [id number] Arrival date & time: 08/26/19  1907      History   Chief Complaint Chief Complaint  Patient presents with  . Back Pain    HPI Dana Hughes is a 39 y.o. female.   She is presenting with right low back pain.  The pain is gotten worse today.  It seems to be localized to lower back.  She denies any radicular symptoms.  Has a distant history of nephrolithiasis.  This does not feel like that.  No injury or inciting event.  Seem to be worse with standing at work.  Has tried ibuprofen.  HPI  Past Medical History:  Diagnosis Date  . Anxiety   . Arthritis   . Asthma   . BV (bacterial vaginosis)   . Candidiasis   . Depression   . Headache(784.0)    migraines  . Hypertension    no longer takes meds  . Migraines   . Muscle spasm   . Ovarian cyst   . Scoliosis     Patient Active Problem List   Diagnosis Date Noted  . Abnormal uterine bleeding (AUB) 02/20/2019  . History of bilateral tubal ligation 02/20/2019  . Carpal tunnel syndrome, bilateral 01/16/2019  . Hypertension 07/21/2018  . Endometriosis 08/30/2017  . Pelvic pain in female 09/29/2015    Past Surgical History:  Procedure Laterality Date  . CHOLECYSTECTOMY    . TUBAL LIGATION      OB History    Gravida  4   Para  3   Term  3   Preterm  0   AB  1   Living  3     SAB  1   TAB  0   Ectopic  0   Multiple  0   Live Births  3        Obstetric Comments  SVD x 3         Home Medications    Prior to Admission medications   Medication Sig Start Date End Date Taking? Authorizing Provider  albuterol (PROVENTIL HFA;VENTOLIN HFA) 108 (90 Base) MCG/ACT inhaler Inhale 2 puffs into the lungs every 6 (six) hours as needed for wheezing or shortness of breath. Reported on 09/29/2015   Yes [provider]  ibuprofen (ADVIL) 800 MG tablet Take 1 tablet (800 mg total) by mouth every 8 (eight) hours as needed. 06/17/19  Yes Chancy Milroy, MD    albuterol (PROVENTIL) (2.5 MG/3ML) 0.083% nebulizer solution Take 3 mLs (2.5 mg total) by nebulization every 6 (six) hours as needed for wheezing or shortness of breath. 07/02/18   Wurst, [place], PA-C  amLODipine (NORVASC) 5 MG tablet Take 1 tablet (5 mg total) by mouth daily. 01/14/19   Ladona Horns, MD  artificial tears (LACRILUBE) OINT ophthalmic ointment Place into both eyes at bedtime as needed for dry eyes. 05/02/18   Langston Masker B, PA-C  cyclobenzaprine (FLEXERIL) 10 MG tablet Take 1 tablet (10 mg total) by mouth 2 (two) times daily as needed for muscle spasms. 08/26/19   Rosemarie Ax, MD  meclizine (ANTIVERT) 25 MG tablet Take 1 tablet (25 mg total) by mouth 3 (three) times daily as needed for dizziness. Patient not taking: Reported on 07/21/2018 05/06/18   Melynda Ripple, MD  methocarbamol (ROBAXIN) 500 MG tablet Take 1 tablet (500 mg total) by mouth 2 (two) times daily as needed for muscle spasms. Patient not taking: Reported on 05/27/2019 01/13/19   Hall Busing,  Fredderick Phenix, NP  naproxen sodium (ALEVE) 220 MG tablet Take 440 mg by mouth daily as needed (pain).    [provider]  norethindrone (MICRONOR) 0.35 MG tablet Take 1 tablet (0.35 mg total) by mouth daily. 06/29/19   Chancy Milroy, MD  PE-DM-APAP & Doxylamin-DM-APAP (VICKS DAYQUIL/NYQUIL CLD & FLU) (Liquid) MISC Take 30 mLs by mouth 2 (two) times daily as needed (cough).    [provider]    Family History Family History  Problem Relation Age of Onset  . Cancer Father   . Hypertension Maternal Aunt   . Breast cancer Maternal Aunt   . Cancer Paternal Grandmother   . Breast cancer Paternal Grandmother   . Breast cancer Paternal Aunt     Social History Social History   Tobacco Use  . Smoking status: Never Smoker  . Smokeless tobacco: Never Used  Substance Use Topics  . Alcohol use: Not Currently    Comment: rare  . Drug use: Yes    Frequency: 1.0 times per week    Types: Marijuana     Allergies    Mushroom extract complex, Latex, and Penicillins   Review of Systems Review of Systems  See HPI  Physical Exam Triage Vital Signs ED Triage Vitals  Enc Vitals Group     BP 08/26/19 1924 126/90     Pulse Rate 08/26/19 1924 92     Resp 08/26/19 1924 16     Temp 08/26/19 1924 99.6 F (37.6 C)     Temp Source 08/26/19 1924 Oral     SpO2 08/26/19 1924 100 %     Weight --      Height --      Head Circumference --      Peak Flow --      Pain Score 08/26/19 1922 8     Pain Loc --      Pain Edu? --      Excl. in [place]? --    No data found.  Updated Vital Signs BP 126/90   Pulse 92   Temp 99.6 F (37.6 C) (Oral)   Resp 16   SpO2 100%   Visual Acuity Right Eye Distance:   Left Eye Distance:   Bilateral Distance:    Right Eye Near:   Left Eye Near:    Bilateral Near:     Physical Exam Gen: NAD, alert, cooperative with exam, well-appearing ENT: normal lips, normal nasal mucosa,  Psych:  normal insight, alert and oriented MSK:  Back:  TTP in the low right back Mild CVA tenderness  Normal flexion and extension  NVI     UC Treatments / Results  Labs (all labs ordered are listed, but only abnormal results are displayed) Labs Reviewed  POCT URINALYSIS DIP (DEVICE) - Abnormal; Notable for the following components:      Result Value   Hgb urine dipstick TRACE (*)    All other components within normal limits    EKG   Radiology No results found.  Procedures Procedures (including critical care time)  Medications Ordered in UC Medications - No data to display  Initial Impression / Assessment and Plan / UC Course  I have reviewed the triage vital signs and the nursing notes.  Pertinent labs & imaging results that were available during my care of the patient were reviewed by me and considered in my medical decision making (see chart for details).     [name] is a 39 year old female is presenting with  right-sided low back pain.  Urinalysis was showing  trace hemoglobin.  She has reported some spotting.  Seems less likely for kidney stone.  More musculoskeletal in nature.  Provided Flexeril.  Counseled on supportive care.  If symptoms worsening and she needs a follow-up.  Provided work note.  Final Clinical Impressions(s) / UC Diagnoses   Final diagnoses:  Acute right-sided low back pain without sciatica     Discharge Instructions     Please try heat  Please try ibuprofen and the muscle relaxer  Please follow up if your symptoms worsen or fail to improve.     ED Prescriptions    Medication Sig Dispense Auth. Provider   cyclobenzaprine (FLEXERIL) 10 MG tablet Take 1 tablet (10 mg total) by mouth 2 (two) times daily as needed for muscle spasms. 20 tablet Rosemarie Ax, MD     PDMP not reviewed this encounter.   Rosemarie Ax, MD 08/26/19 2013

## 2019-08-26 NOTE — ED Triage Notes (Signed)
Right sided back/ flank pain for 3 weeks. More severe in last 2 days. PT suspects kidney stones.   Pain worse with movement. PT "twitches" when shoulder and back pain start.

## 2019-08-26 NOTE — Telephone Encounter (Signed)
Pt left VM message stating that she is having sharp pains on the Rt side of her abdomen - near her kidneys. Please call back

## 2019-08-27 NOTE — Telephone Encounter (Signed)
Patient was seen in Urgent Care and her concerns were addressed.

## 2019-09-04 ENCOUNTER — Telehealth (INDEPENDENT_AMBULATORY_CARE_PROVIDER_SITE_OTHER): Payer: Self-pay

## 2019-09-04 DIAGNOSIS — N921 Excessive and frequent menstruation with irregular cycle: Secondary | ICD-10-CM

## 2019-09-04 MED ORDER — MEDROXYPROGESTERONE ACETATE 10 MG PO TABS: 20.0000 mg | ORAL_TABLET | Freq: Every day | ORAL | 2 refills | Status: DC

## 2019-09-04 NOTE — Telephone Encounter (Signed)
Called pt in response to MyChart message. Per chart review pt recently began Depo Provera on 08/13/19. Pt states she has been bleeding like a period for 2.5 weeks with abdominal cramping. Chart reviewed with Nehemiah Settle, DO who gives verbal order for Provera 20 mg daily until pt's next Depo Provera injection. Pt verbalized understanding of plan and agrees to call in 2 weeks if bleeding continues. Bleeding precautions given.

## 2019-09-15 ENCOUNTER — Ambulatory Visit (HOSPITAL_COMMUNITY)
Admission: EM | Admit: 2019-09-15 | Discharge: 2019-09-15 | Disposition: A | Discharge status: Home / Self Care | Payer: Medicaid Other | Attending: Physician Assistant | Admitting: Physician Assistant

## 2019-09-15 ENCOUNTER — Other Ambulatory Visit: Payer: Self-pay

## 2019-09-15 DIAGNOSIS — G5603 Carpal tunnel syndrome, bilateral upper limbs: Secondary | ICD-10-CM

## 2019-09-15 MED ORDER — PREDNISONE 10 MG PO TABS: 40.0000 mg | ORAL_TABLET | Freq: Every day | ORAL | 0 refills | Status: AC

## 2019-09-15 NOTE — Discharge Instructions (Signed)
Take the prednisone daily for 5 days Continue use your brace as Schedule follow-up with the sports medicine group for evaluation of carpal tunnel  Once you have finished the prednisone you may take 3-4 regular strength ibuprofen every 8 hours as needed.

## 2019-09-15 NOTE — ED Triage Notes (Signed)
Pt c/o bilateral hand pain. Pt c/o numbness and tingling. Pt has hx of carpal tunnel. Pain travels up to arms and shoulder, 7/10. Pt uses sling at work and at night with minimal relief.  Pt has tired Icy hot, warm compress, Motrin, Tylenol with no relief.  Pt denies fever, n/v/d or abdominal.

## 2019-09-15 NOTE — ED Provider Notes (Signed)
[place]    CSN: [id number] Arrival date & time: 09/15/19  1923      History   Chief Complaint Chief Complaint  Patient presents with  . Hand Pain    HPI Dana Hughes is a 39 y.o. female.   Patient with history of carpal tunnel syndrome presents for bilateral hand and wrist pain.  She reports burning sensation into her thumb and first 2 fingers.  She reports this goes numb at night at times.  She reports she has carpal tunnel braces at home that she wears at night and most during the day.  She reports this pain also does radiate up her arm at times.  She was at some point to follow-up with a hand surgeon however did not do this as she moved and lost the information.  She denies significant changes other than a flareup currently.  Denies fever or chills.     Past Medical History:  Diagnosis Date  . Anxiety   . Arthritis   . Asthma   . BV (bacterial vaginosis)   . Candidiasis   . Depression   . Headache(784.0)    migraines  . Hypertension    no longer takes meds  . Migraines   . Muscle spasm   . Ovarian cyst   . Scoliosis     Patient Active Problem List   Diagnosis Date Noted  . Abnormal uterine bleeding (AUB) 02/20/2019  . History of bilateral tubal ligation 02/20/2019  . Carpal tunnel syndrome, bilateral 01/16/2019  . Hypertension 07/21/2018  . Endometriosis 08/30/2017  . Pelvic pain in female 09/29/2015    Past Surgical History:  Procedure Laterality Date  . CHOLECYSTECTOMY    . TUBAL LIGATION      OB History    Gravida  4   Para  3   Term  3   Preterm  0   AB  1   Living  3     SAB  1   TAB  0   Ectopic  0   Multiple  0   Live Births  3        Obstetric Comments  SVD x 3         Home Medications    Prior to Admission medications   Medication Sig Start Date End Date Taking? Authorizing Provider  amLODipine (NORVASC) 5 MG tablet Take 1 tablet (5 mg total) by mouth daily. 01/14/19  Yes Ladona Horns, MD    cyclobenzaprine (FLEXERIL) 10 MG tablet Take 1 tablet (10 mg total) by mouth 2 (two) times daily as needed for muscle spasms. 08/26/19  Yes Rosemarie Ax, MD  ibuprofen (ADVIL) 800 MG tablet Take 1 tablet (800 mg total) by mouth every 8 (eight) hours as needed. 06/17/19  Yes Chancy Milroy, MD  naproxen sodium (ALEVE) 220 MG tablet Take 440 mg by mouth daily as needed (pain).   Yes [provider]  albuterol (PROVENTIL HFA;VENTOLIN HFA) 108 (90 Base) MCG/ACT inhaler Inhale 2 puffs into the lungs every 6 (six) hours as needed for wheezing or shortness of breath. Reported on 09/29/2015    [provider]  albuterol (PROVENTIL) (2.5 MG/3ML) 0.083% nebulizer solution Take 3 mLs (2.5 mg total) by nebulization every 6 (six) hours as needed for wheezing or shortness of breath. 07/02/18   Wurst, [place], PA-C  artificial tears (LACRILUBE) OINT ophthalmic ointment Place into both eyes at bedtime as needed for dry eyes. 05/02/18   Albesa Seen,  PA-C  meclizine (ANTIVERT) 25 MG tablet Take 1 tablet (25 mg total) by mouth 3 (three) times daily as needed for dizziness. Patient not taking: Reported on 07/21/2018 05/06/18   Melynda Ripple, MD  medroxyPROGESTERone (PROVERA) 10 MG tablet Take 2 tablets (20 mg total) by mouth daily. 09/04/19   Truett Mainland, DO  methocarbamol (ROBAXIN) 500 MG tablet Take 1 tablet (500 mg total) by mouth 2 (two) times daily as needed for muscle spasms. Patient not taking: Reported on 05/27/2019 01/13/19   Sharion Balloon, NP  norethindrone (MICRONOR) 0.35 MG tablet Take 1 tablet (0.35 mg total) by mouth daily. 06/29/19   Chancy Milroy, MD  PE-DM-APAP & Doxylamin-DM-APAP (VICKS DAYQUIL/NYQUIL CLD & FLU) (Liquid) MISC Take 30 mLs by mouth 2 (two) times daily as needed (cough).    [provider]  predniSONE (DELTASONE) 10 MG tablet Take 4 tablets (40 mg total) by mouth daily for 5 days. 09/15/19 09/20/19  Takahiro Godinho, Marguerita Beards, PA-C    Family History Family History   Problem Relation Age of Onset  . Cancer Father   . Hypertension Maternal Aunt   . Breast cancer Maternal Aunt   . Cancer Paternal Grandmother   . Breast cancer Paternal Grandmother   . Breast cancer Paternal Aunt     Social History Social History   Tobacco Use  . Smoking status: Never Smoker  . Smokeless tobacco: Never Used  Substance Use Topics  . Alcohol use: Not Currently    Comment: rare  . Drug use: Yes    Frequency: 1.0 times per week    Types: Marijuana     Allergies   Mushroom extract complex, Latex, and Penicillins   Review of Systems Review of Systems   Physical Exam Triage Vital Signs ED Triage Vitals  Enc Vitals Group     BP 09/15/19 1950 121/73     Pulse Rate 09/15/19 1950 (!) 113     Resp 09/15/19 1950 16     Temp --      Temp Source 09/15/19 1950 Oral     SpO2 09/15/19 1950 100 %     Weight --      Height --      Head Circumference --      Peak Flow --      Pain Score 09/15/19 1947 7     Pain Loc --      Pain Edu? --      Excl. in [place]? --    No data found.  Updated Vital Signs BP 121/73 (BP Location: Left Arm)   Pulse (!) 113   Resp 16   LMP 08/13/2019   SpO2 100%   Visual Acuity Right Eye Distance:   Left Eye Distance:   Bilateral Distance:    Right Eye Near:   Left Eye Near:    Bilateral Near:     Physical Exam Vitals and nursing note reviewed.  Constitutional:      General: She is not in acute distress.    Appearance: She is well-developed.  HENT:     Head: Normocephalic and atraumatic.  Eyes:     Conjunctiva/sclera: Conjunctivae normal.  Cardiovascular:     Rate and Rhythm: Normal rate and regular rhythm.  Pulmonary:     Effort: Pulmonary effort is normal. No respiratory distress.  Musculoskeletal:     Cervical back: Neck supple.     Comments: No deformity, erythema or edema of the wrist or hand.  No thenar atrophy.  Good cap refill.  There is diminished sensation over the median nerve distribution.  Phalen's  positive.  Tinel's positive  Skin:    General: Skin is warm and dry.  Neurological:     Mental Status: She is alert and oriented to person, place, and time.      UC Treatments / Results  Labs (all labs ordered are listed, but only abnormal results are displayed) Labs Reviewed - No data to display  EKG   Radiology No results found.  Procedures Procedures (including critical care time)  Medications Ordered in UC Medications - No data to display  Initial Impression / Assessment and Plan / UC Course  I have reviewed the triage vital signs and the nursing notes.  Pertinent labs & imaging results that were available during my care of the patient were reviewed by me and considered in my medical decision making (see chart for details).     #Bilateral carpal tunnel Patient is 39 year old presenting with bilateral carpal tunnel syndrome.  She has braces at home.  Instructed to continue wearing these at all times when possible.  Will do short course of prednisone followed by NSAID therapy.  Sports medicine group information given for follow-up.  Patient verbalized understanding plan. Final Clinical Impressions(s) / UC Diagnoses   Final diagnoses:  Bilateral carpal tunnel syndrome     Discharge Instructions     Take the prednisone daily for 5 days Continue use your brace as Schedule follow-up with the sports medicine group for evaluation of carpal tunnel  Once you have finished the prednisone you may take 3-4 regular strength ibuprofen every 8 hours as needed.      ED Prescriptions    Medication Sig Dispense Auth. Provider   predniSONE (DELTASONE) 10 MG tablet Take 4 tablets (40 mg total) by mouth daily for 5 days. 20 tablet Luz Mares, Marguerita Beards, PA-C     PDMP not reviewed this encounter.   Purnell Shoemaker, PA-C 09/15/19 2027

## 2019-09-17 ENCOUNTER — Telehealth (HOSPITAL_COMMUNITY): Payer: Self-pay | Admitting: Internal Medicine

## 2019-09-24 ENCOUNTER — Ambulatory Visit: Payer: Self-pay | Admitting: Sports Medicine

## 2019-10-13 ENCOUNTER — Encounter (HOSPITAL_COMMUNITY): Payer: Self-pay

## 2019-10-13 ENCOUNTER — Other Ambulatory Visit: Payer: Self-pay

## 2019-10-13 ENCOUNTER — Emergency Department (HOSPITAL_COMMUNITY)
Admission: EM | Admit: 2019-10-13 | Discharge: 2019-10-14 | Disposition: A | Discharge status: Home / Self Care | Payer: Medicaid Other | Attending: Emergency Medicine | Admitting: Emergency Medicine

## 2019-10-13 DIAGNOSIS — R519 Headache, unspecified: Secondary | ICD-10-CM | POA: Insufficient documentation

## 2019-10-13 DIAGNOSIS — Z5321 Procedure and treatment not carried out due to patient leaving prior to being seen by health care provider: Secondary | ICD-10-CM | POA: Insufficient documentation

## 2019-10-13 DIAGNOSIS — R55 Syncope and collapse: Secondary | ICD-10-CM | POA: Insufficient documentation

## 2019-10-13 DIAGNOSIS — R110 Nausea: Secondary | ICD-10-CM | POA: Insufficient documentation

## 2019-10-13 DIAGNOSIS — H53149 Visual discomfort, unspecified: Secondary | ICD-10-CM | POA: Insufficient documentation

## 2019-10-13 LAB — BASIC METABOLIC PANEL
Anion gap: 11 (ref 5–15)
BUN: <5 mg/dL — ABNORMAL LOW (ref 6–20)
CO2: 21 mmol/L — ABNORMAL LOW (ref 22–32)
Calcium: 9.2 mg/dL (ref 8.9–10.3)
Chloride: 104 mmol/L (ref 98–111)
Creatinine, Ser: 0.79 mg/dL (ref 0.44–1.00)
GFR calc Af Amer: >60 mL/min (ref >60)
GFR calc non Af Amer: >60 mL/min (ref >60)
Glucose, Bld: 263 mg/dL — ABNORMAL HIGH (ref 70–99)
Potassium: 3.2 mmol/L — ABNORMAL LOW (ref 3.5–5.1)
Sodium: 136 mmol/L (ref 135–145)

## 2019-10-13 LAB — CBC
HCT: 37.4 % (ref 36.0–46.0)
Hemoglobin: 12.1 g/dL (ref 12.0–15.0)
MCH: 28.4 pg (ref 26.0–34.0)
MCHC: 32.4 g/dL (ref 30.0–36.0)
MCV: 87.8 fL (ref 80.0–100.0)
Platelets: 416 10*3/uL — ABNORMAL HIGH (ref 150–400)
RBC: 4.26 MIL/uL (ref 3.87–5.11)
RDW: 14.2 % (ref 11.5–15.5)
WBC: 9.6 10*3/uL (ref 4.0–10.5)
nRBC: 0 % (ref 0.0–0.2)

## 2019-10-13 MED ORDER — SODIUM CHLORIDE 0.9% FLUSH: 3.0000 mL | Freq: Once | INTRAVENOUS | Status: DC

## 2019-10-13 NOTE — ED Triage Notes (Signed)
Pt reports that she has been having migraines for the past week with photophobia and nausea, pt states that her BP has also been high and she is now out of her BP meds. Today and yesterday she states she had a near syncopal episode while driving.

## 2019-10-14 ENCOUNTER — Ambulatory Visit (HOSPITAL_COMMUNITY)
Admission: EM | Admit: 2019-10-14 | Discharge: 2019-10-14 | Disposition: A | Discharge status: Home / Self Care | Payer: Medicaid Other | Attending: Family Medicine | Admitting: Family Medicine

## 2019-10-14 ENCOUNTER — Encounter (HOSPITAL_COMMUNITY): Payer: Self-pay

## 2019-10-14 ENCOUNTER — Other Ambulatory Visit: Payer: Self-pay

## 2019-10-14 DIAGNOSIS — I10 Essential (primary) hypertension: Secondary | ICD-10-CM

## 2019-10-14 DIAGNOSIS — Z760 Encounter for issue of repeat prescription: Secondary | ICD-10-CM

## 2019-10-14 DIAGNOSIS — G43801 Other migraine, not intractable, with status migrainosus: Secondary | ICD-10-CM

## 2019-10-14 MED ORDER — KETOROLAC TROMETHAMINE 60 MG/2ML IM SOLN
INTRAMUSCULAR | Status: AC
  Filled 2019-10-14: qty 2

## 2019-10-14 MED ORDER — AMLODIPINE BESYLATE 5 MG PO TABS: 5.0000 mg | ORAL_TABLET | Freq: Every day | ORAL | 1 refills | Status: DC

## 2019-10-14 MED ORDER — KETOROLAC TROMETHAMINE 60 MG/2ML IM SOLN
60.0000 mg | Freq: Once | INTRAMUSCULAR | Status: AC
  Administered 2019-10-14: 60 mg via INTRAMUSCULAR

## 2019-10-14 MED ORDER — DEXAMETHASONE SODIUM PHOSPHATE 10 MG/ML IJ SOLN
INTRAMUSCULAR | Status: AC
  Filled 2019-10-14: qty 1

## 2019-10-14 MED ORDER — DEXAMETHASONE SODIUM PHOSPHATE 10 MG/ML IJ SOLN
10.0000 mg | Freq: Once | INTRAMUSCULAR | Status: AC
  Administered 2019-10-14: 10 mg via INTRAMUSCULAR

## 2019-10-14 NOTE — ED Provider Notes (Signed)
[place]   [id number] 10/14/19 Arrival Time: 0836  CC: HEADACHE  SUBJECTIVE:  Dana Hughes is a 39 y.o. female who complains of migraine for 2 weeks. Denies a precipitating event, or recent head trauma. Does report that she has been out of her blood pressure medication for "a while." Went to ER last night, had labs drawn but left without being seen. Patient localizes her pain to her whole head. Describes the pain as constant and throbbing in character. Patient has tried OTC tylenol and ibuprofen without relief. Symptoms are made worse with light and noise. Reports similar symptoms in the past that improved with OTC medications. This is not the worst headache of their life. Patient denies fever, chills, nausea, vomiting, aura, rhinorrhea, watery eyes, chest pain, SOB, abdominal pain, weakness, numbness or tingling, slurred speech.     ROS: As per HPI.  All other pertinent ROS negative.     Past Medical History:  Diagnosis Date   Anxiety    Arthritis    Asthma    BV (bacterial vaginosis)    Candidiasis    Depression    Headache(784.0)    migraines   Hypertension    no longer takes meds   Migraines    Muscle spasm    Ovarian cyst    Scoliosis    Past Surgical History:  Procedure Laterality Date   CHOLECYSTECTOMY     TUBAL LIGATION     Allergies  Allergen Reactions   Mushroom Extract Complex Anaphylaxis   Latex Itching and Swelling   Penicillins Itching    Has patient had a PCN reaction causing immediate rash, facial/tongue/throat swelling, SOB or lightheadedness with hypotension: No Has patient had a PCN reaction causing severe rash involving mucus membranes or skin necrosis: No Has patient had a PCN reaction that required hospitalization No Has patient had a PCN reaction occurring within the last 10 years: No If all of the above answers are "NO", then may proceed with Cephalosporin use.    Current Facility-Administered Medications on  File Prior to Encounter  Medication Dose Route Frequency Provider Last Rate Last Admin   medroxyPROGESTERone (DEPO-PROVERA) injection 150 mg  150 mg Intramuscular Q90 days Chancy Milroy, MD   150 mg at 08/13/19 1040   Current Outpatient Medications on File Prior to Encounter  Medication Sig Dispense Refill   albuterol (PROVENTIL HFA;VENTOLIN HFA) 108 (90 Base) MCG/ACT inhaler Inhale 2 puffs into the lungs every 6 (six) hours as needed for wheezing or shortness of breath. Reported on 09/29/2015     albuterol (PROVENTIL) (2.5 MG/3ML) 0.083% nebulizer solution Take 3 mLs (2.5 mg total) by nebulization every 6 (six) hours as needed for wheezing or shortness of breath. 75 mL 0   artificial tears (LACRILUBE) OINT ophthalmic ointment Place into both eyes at bedtime as needed for dry eyes. 3.5 g 0   cyclobenzaprine (FLEXERIL) 10 MG tablet Take 1 tablet (10 mg total) by mouth 2 (two) times daily as needed for muscle spasms. 20 tablet 0   ibuprofen (ADVIL) 800 MG tablet Take 1 tablet (800 mg total) by mouth every 8 (eight) hours as needed. 30 tablet 0   meclizine (ANTIVERT) 25 MG tablet Take 1 tablet (25 mg total) by mouth 3 (three) times daily as needed for dizziness. (Patient not taking: Reported on 07/21/2018) 30 tablet 0   medroxyPROGESTERone (PROVERA) 10 MG tablet Take 2 tablets (20 mg total) by mouth daily. 60 tablet 2   methocarbamol (ROBAXIN) 500 MG tablet  Take 1 tablet (500 mg total) by mouth 2 (two) times daily as needed for muscle spasms. (Patient not taking: Reported on 05/27/2019) 20 tablet 0   naproxen sodium (ALEVE) 220 MG tablet Take 440 mg by mouth daily as needed (pain).     norethindrone (MICRONOR) 0.35 MG tablet Take 1 tablet (0.35 mg total) by mouth daily. 1 Package 11   PE-DM-APAP & Doxylamin-DM-APAP (VICKS DAYQUIL/NYQUIL CLD & FLU) (Liquid) MISC Take 30 mLs by mouth 2 (two) times daily as needed (cough).     Social History   Socioeconomic History   Marital status: Single      Spouse name: Not on file   Number of children: Not on file   Years of education: Not on file   Highest education level: Not on file  Occupational History   Not on file  Tobacco Use   Smoking status: Never Smoker   Smokeless tobacco: Never Used  Substance and Sexual Activity   Alcohol use: Not Currently    Comment: rare   Drug use: Yes    Frequency: 1.0 times per week    Types: Marijuana   Sexual activity: Yes    Birth control/protection: Condom, Surgical  Other Topics Concern   Not on file  Social History Narrative   Not on file   Social Determinants of Health   Financial Resource Strain:    Difficulty of Paying Living Expenses:   Food Insecurity:    Worried About Charity fundraiser in the Last Year:    Arboriculturist in the Last Year:   Transportation Needs:    Film/video editor (Medical):    Lack of Transportation (Non-Medical):   Physical Activity:    Days of Exercise per Week:    Minutes of Exercise per Session:   Stress:    Feeling of Stress :   Social Connections:    Frequency of Communication with Friends and Family:    Frequency of Social Gatherings with Friends and Family:    Attends Religious Services:    Active Member of Clubs or Organizations:    Attends Music therapist:    Marital Status:   Intimate Partner Violence:    Fear of Current or Ex-Partner:    Emotionally Abused:    Physically Abused:    Sexually Abused:    Family History  Problem Relation Age of Onset   Cancer Father    Hypertension Maternal Aunt    Breast cancer Maternal Aunt    Cancer Paternal Grandmother    Breast cancer Paternal Grandmother    Breast cancer Paternal Aunt     OBJECTIVE:  Vitals:   10/14/19 0849  BP: 119/82  Pulse: 85  Resp: 16  Temp: 98.2 F (36.8 C)  SpO2: 100%    General appearance: alert; no distress Eyes: PERRLA; EOMI HENT: normocephalic; atraumatic Neck: supple with FROM Lungs: clear  to auscultation bilaterally Heart: regular rate and rhythm.  Radial pulses 2+ symmetrical bilaterally Extremities: no edema; symmetrical with no gross deformities Skin: warm and dry Neurologic: CN 2-12 grossly intact; finger to nose without difficulty; normal gait; strength and sensation intact bilaterally about the upper and lower extremities; negative pronator drift Psychological: alert and cooperative; normal mood and affect   ASSESSMENT & PLAN:  1. Other migraine with status migrainosus, not intractable   2. Hypertension, unspecified type   3. Medication refill   4. Essential hypertension   5. Hypocalcemia     Meds ordered this encounter  Medications   ketorolac (TORADOL) injection 60 mg   dexamethasone (DECADRON) injection 10 mg   amLODipine (NORVASC) 5 MG tablet    Sig: Take 1 tablet (5 mg total) by mouth daily.    Dispense:  90 tablet    Refill:  1    Order Specific Question:   Supervising Provider    Answer:   Chase Picket [[id number]]   Migraine Hyptertension Medication Refill Hypocalcemia  Migraine cocktail given in office, Decadron 10mg  IM and Toradol 60mg  IM  Refilled amlodipine Reviewed labs drawn in ER, reviewed visit EKG was normal in ER last night Instructed to eat foods higher in calcium and/or supplement with oral calcium Rest and drink plenty of fluids Use OTC medications as needed for symptomatic relief Follow up with PCP if symptoms persists Return or go to the ER if you have any new or worsening symptoms such as fever, chills, nausea, vomiting, chest pain, shortness of breath, cough, vision changes, worsening headache despite treatment, slurred speech, facial asymmetry, weakness in arms or legs.  Reviewed expectations re: course of current medical issues. Questions answered. Outlined signs and symptoms indicating need for more acute intervention. Patient verbalized understanding. After Visit Summary given.    Faustino Congress,  NP 10/14/19 [phone]

## 2019-10-14 NOTE — Discharge Instructions (Addendum)
You have received Toradol and Decadron as injections in the office today to help stop your migraine  I have refilled your blood pressure medications  Follow up with primary care or this office as needed  Go to the ER for the worst headache of your life, arm/leg numbness or tingling, chest pain, other concerning symptoms

## 2019-10-14 NOTE — ED Triage Notes (Signed)
Patient reports migraine x2 weeks. Reports at home she has been taking her BP and it has been high.

## 2019-10-17 ENCOUNTER — Other Ambulatory Visit: Payer: Self-pay

## 2019-10-17 ENCOUNTER — Ambulatory Visit (HOSPITAL_COMMUNITY)
Admission: EM | Admit: 2019-10-17 | Discharge: 2019-10-17 | Disposition: A | Discharge status: Home / Self Care | Payer: Medicaid Other | Attending: Physician Assistant | Admitting: Physician Assistant

## 2019-10-17 ENCOUNTER — Encounter (HOSPITAL_COMMUNITY): Payer: Self-pay | Admitting: Physician Assistant

## 2019-10-17 DIAGNOSIS — G43009 Migraine without aura, not intractable, without status migrainosus: Secondary | ICD-10-CM

## 2019-10-17 MED ORDER — SUMATRIPTAN SUCCINATE 100 MG PO TABS
100.0000 mg | ORAL_TABLET | ORAL | 0 refills | Status: DC | PRN
Start: 2019-10-17 — End: 2020-05-05

## 2019-10-17 MED ORDER — METOCLOPRAMIDE HCL 5 MG/ML IJ SOLN
5.0000 mg | Freq: Once | INTRAMUSCULAR | Status: AC
  Administered 2019-10-17: 5 mg via INTRAMUSCULAR

## 2019-10-17 MED ORDER — SUMATRIPTAN SUCCINATE 6 MG/0.5ML ~~LOC~~ SOLN
SUBCUTANEOUS | Status: AC
  Filled 2019-10-17: qty 0.5

## 2019-10-17 MED ORDER — SUMATRIPTAN SUCCINATE 6 MG/0.5ML ~~LOC~~ SOLN
6.0000 mg | Freq: Once | SUBCUTANEOUS | Status: AC
  Administered 2019-10-17: 6 mg via SUBCUTANEOUS

## 2019-10-17 MED ORDER — METOCLOPRAMIDE HCL 5 MG/ML IJ SOLN
INTRAMUSCULAR | Status: AC
  Filled 2019-10-17: qty 2

## 2019-10-17 NOTE — ED Provider Notes (Signed)
[place]    CSN: [id number] Arrival date & time: 10/17/19  1151      History   Chief Complaint Chief Complaint  Patient presents with  . Migraine  . Dizziness    HPI Dana Hughes is a 39 y.o. female.   Who presents with an ongoing Migraine that began a week ago. She was seen here on 6/23 and was given both Decadron and Toradol without much relief. She has light sensitivity. No aura. The pain is bilateral. No change in vision. No nausea. She has not seen a neurologist and having trouble getting in with her PCP at this time. She has no seizure history. She does carry a history of HTN, appearing controlled.      Past Medical History:  Diagnosis Date  . Anxiety   . Arthritis   . Asthma   . BV (bacterial vaginosis)   . Candidiasis   . Depression   . Headache(784.0)    migraines  . Hypertension    no longer takes meds  . Migraines   . Muscle spasm   . Ovarian cyst   . Scoliosis     Patient Active Problem List   Diagnosis Date Noted  . Abnormal uterine bleeding (AUB) 02/20/2019  . History of bilateral tubal ligation 02/20/2019  . Carpal tunnel syndrome, bilateral 01/16/2019  . Hypertension 07/21/2018  . Endometriosis 08/30/2017  . Pelvic pain in female 09/29/2015    Past Surgical History:  Procedure Laterality Date  . CHOLECYSTECTOMY    . TUBAL LIGATION      OB History    Gravida  4   Para  3   Term  3   Preterm  0   AB  1   Living  3     SAB  1   TAB  0   Ectopic  0   Multiple  0   Live Births  3        Obstetric Comments  SVD x 3         Home Medications    Prior to Admission medications   Medication Sig Start Date End Date Taking? Authorizing Provider  albuterol (PROVENTIL HFA;VENTOLIN HFA) 108 (90 Base) MCG/ACT inhaler Inhale 2 puffs into the lungs every 6 (six) hours as needed for wheezing or shortness of breath. Reported on 09/29/2015    [provider]  albuterol (PROVENTIL) (2.5 MG/3ML) 0.083%  nebulizer solution Take 3 mLs (2.5 mg total) by nebulization every 6 (six) hours as needed for wheezing or shortness of breath. 07/02/18   Wurst, [place], PA-C  amLODipine (NORVASC) 5 MG tablet Take 1 tablet (5 mg total) by mouth daily. 10/14/19   Faustino Congress, NP  artificial tears (LACRILUBE) OINT ophthalmic ointment Place into both eyes at bedtime as needed for dry eyes. 05/02/18   Langston Masker B, PA-C  cyclobenzaprine (FLEXERIL) 10 MG tablet Take 1 tablet (10 mg total) by mouth 2 (two) times daily as needed for muscle spasms. 08/26/19   Rosemarie Ax, MD  ibuprofen (ADVIL) 800 MG tablet Take 1 tablet (800 mg total) by mouth every 8 (eight) hours as needed. 06/17/19   Chancy Milroy, MD  meclizine (ANTIVERT) 25 MG tablet Take 1 tablet (25 mg total) by mouth 3 (three) times daily as needed for dizziness. Patient not taking: Reported on 07/21/2018 05/06/18   Melynda Ripple, MD  medroxyPROGESTERone (PROVERA) 10 MG tablet Take 2 tablets (20 mg total) by mouth daily. 09/04/19   Stinson,  Tanna Savoy, DO  methocarbamol (ROBAXIN) 500 MG tablet Take 1 tablet (500 mg total) by mouth 2 (two) times daily as needed for muscle spasms. Patient not taking: Reported on 05/27/2019 01/13/19   Sharion Balloon, NP  naproxen sodium (ALEVE) 220 MG tablet Take 440 mg by mouth daily as needed (pain).    [provider]  norethindrone (MICRONOR) 0.35 MG tablet Take 1 tablet (0.35 mg total) by mouth daily. 06/29/19   Chancy Milroy, MD  PE-DM-APAP & Doxylamin-DM-APAP (VICKS DAYQUIL/NYQUIL CLD & FLU) (Liquid) MISC Take 30 mLs by mouth 2 (two) times daily as needed (cough).    [provider]  SUMAtriptan (IMITREX) 100 MG tablet Take 1 tablet (100 mg total) by mouth every 2 (two) hours as needed for migraine. May repeat in 2 hours if headache persists or recurs. No more than 2 in a 24 hour period 10/17/19   Bjorn Pippin, PA-C    Family History Family History  Problem Relation Age of Onset  . Cancer  Father   . Hypertension Maternal Aunt   . Breast cancer Maternal Aunt   . Cancer Paternal Grandmother   . Breast cancer Paternal Grandmother   . Breast cancer Paternal Aunt     Social History Social History   Tobacco Use  . Smoking status: Never Smoker  . Smokeless tobacco: Never Used  Substance Use Topics  . Alcohol use: Not Currently    Comment: rare  . Drug use: Yes    Frequency: 1.0 times per week    Types: Marijuana     Allergies   Mushroom extract complex, Latex, and Penicillins   Review of Systems Review of Systems  Gastrointestinal: Negative for nausea.  Musculoskeletal: Negative for myalgias.  Neurological: Positive for dizziness and headaches. Negative for seizures, weakness and numbness.  Psychiatric/Behavioral: Negative.   All other systems reviewed and are negative.    Physical Exam Triage Vital Signs ED Triage Vitals  Enc Vitals Group     BP 10/17/19 1254 118/79     Pulse Rate 10/17/19 1254 71     Resp 10/17/19 1254 16     Temp 10/17/19 1254 98.6 F (37 C)     Temp Source 10/17/19 1254 Oral     SpO2 10/17/19 1254 100 %     Weight --      Height --      Head Circumference --      Peak Flow --      Pain Score 10/17/19 1255 6     Pain Loc --      Pain Edu? --      Excl. in [place]? --    No data found.  Updated Vital Signs BP 118/79 (BP Location: Right Arm)   Pulse 71   Temp 98.6 F (37 C) (Oral)   Resp 16   SpO2 100%   Visual Acuity Right Eye Distance:   Left Eye Distance:   Bilateral Distance:    Right Eye Near:   Left Eye Near:    Bilateral Near:     Physical Exam Vitals and nursing note reviewed.  Constitutional:      General: She is not in acute distress.    Appearance: Normal appearance. She is not ill-appearing.  HENT:     Head: Normocephalic and atraumatic.  Pulmonary:     Effort: Pulmonary effort is normal.  Musculoskeletal:     Cervical back: Normal range of motion and neck supple.  Neurological:  General: No  focal deficit present.     Mental Status: She is alert and oriented to person, place, and time.     Cranial Nerves: No cranial nerve deficit.     Sensory: No sensory deficit.     Motor: No weakness.     Gait: Gait normal.  Psychiatric:        Mood and Affect: Mood normal.      UC Treatments / Results  Labs (all labs ordered are listed, but only abnormal results are displayed) Labs Reviewed - No data to display  EKG   Radiology No results found.  Procedures Procedures (including critical care time)  Medications Ordered in UC Medications  metoCLOPramide (REGLAN) injection 5 mg (has no administration in time range)  SUMAtriptan (IMITREX) injection 6 mg (has no administration in time range)    Initial Impression / Assessment and Plan / UC Course  I have reviewed the triage vital signs and the nursing notes.  Pertinent labs & imaging results that were available during my care of the patient were reviewed by me and considered in my medical decision making (see chart for details).     Treat with Triptan and Reglan as prior Decadron and Toradol not effective for her. BP appears controlled. Given a few Imitrex to take at start of a migraine, but will need regular f/u and monitoring for further preventative or prophylactic migraine medications. No history of seizures. FU as needed.  Final Clinical Impressions(s) / UC Diagnoses   Final diagnoses:  Migraine without aura and without status migrainosus, not intractable     Discharge Instructions     You are given 2 injections today that should help. Go and lie down in a dark room, ice sometimes help and relax. A few Imitrex are given as needed at the start of a migraine. Please f/u with a PCP to have these refilled if help you or for other options. Feel better   ED Prescriptions    Medication Sig Dispense Auth. Provider   SUMAtriptan (IMITREX) 100 MG tablet Take 1 tablet (100 mg total) by mouth every 2 (two) hours as needed  for migraine. May repeat in 2 hours if headache persists or recurs. No more than 2 in a 24 hour period 10 tablet Bjorn Pippin, [place]     PDMP not reviewed this encounter.   Bjorn Pippin, PA-C 10/17/19 1316

## 2019-10-17 NOTE — Discharge Instructions (Signed)
You are given 2 injections today that should help. Go and lie down in a dark room, ice sometimes help and relax. A few Imitrex are given as needed at the start of a migraine. Please f/u with a PCP to have these refilled if help you or for other options. Feel better

## 2019-10-17 NOTE — ED Triage Notes (Signed)
Pt present severe headaches with some dizziness. Symptoms started a week ago. Pt tried otc medication with no relief.

## 2019-10-21 ENCOUNTER — Telehealth: Payer: Self-pay | Admitting: Family Medicine

## 2019-10-21 NOTE — Telephone Encounter (Signed)
Patient is calling because she has started spotting. She feels something is not right, and wants to speak to a nurse.

## 2019-10-22 NOTE — Telephone Encounter (Signed)
Called pt and pt was informed that she is having irregular bleeding that she sometimes has to wear a pad.  I explained to the patient that it is normal to have irregular bleeding when she starts the Depo Provera and to give it at least three injections before she sees changes in her cycle.  Pt stated that she is also taking Provera tablets twice a day.  I informed her to continue as the provider had advised.  Pt verbalized understanding.    Mel Almond, RN

## 2019-10-29 ENCOUNTER — Other Ambulatory Visit: Payer: Self-pay

## 2019-10-29 ENCOUNTER — Ambulatory Visit (INDEPENDENT_AMBULATORY_CARE_PROVIDER_SITE_OTHER): Payer: Medicaid Other | Admitting: *Deleted

## 2019-10-29 VITALS — BP 113/80 | HR 97

## 2019-10-29 DIAGNOSIS — F3289 Other specified depressive episodes: Secondary | ICD-10-CM

## 2019-10-29 DIAGNOSIS — G43911 Migraine, unspecified, intractable, with status migrainosus: Secondary | ICD-10-CM

## 2019-10-29 DIAGNOSIS — I10 Essential (primary) hypertension: Secondary | ICD-10-CM

## 2019-10-29 DIAGNOSIS — Z30013 Encounter for initial prescription of injectable contraceptive: Secondary | ICD-10-CM | POA: Diagnosis not present

## 2019-10-29 DIAGNOSIS — F419 Anxiety disorder, unspecified: Secondary | ICD-10-CM

## 2019-10-29 NOTE — Progress Notes (Signed)
Patient seen and assessed by nursing staff.  Agree with documentation and plan.  

## 2019-10-29 NOTE — Patient Instructions (Signed)
[facility]- [phone]

## 2019-10-29 NOTE — Progress Notes (Signed)
Here for depo-provera. Tolerated injection without complain. Also reports has been to urgent care recently for c/o migraines and high blood pressure. States trying to find PCP. Has medicaid family planning- trying to get full medicaid . I encouraged her to call [facility] at [facility]. I informed her I will put referral in to [facility] and for her to call them to follow up. Also noted phq9 and gad 7 elevated.offered appointment with [facility] which she accepted- will make appointments at checkout. She also c/o having pain and bleeding issues- will make appointment with provider at checkout today. Leocadio Heal,RN

## 2019-11-09 ENCOUNTER — Telehealth (INDEPENDENT_AMBULATORY_CARE_PROVIDER_SITE_OTHER): Payer: Self-pay | Admitting: Clinical

## 2019-11-09 DIAGNOSIS — Z5329 Procedure and treatment not carried out because of patient's decision for other reasons: Secondary | ICD-10-CM

## 2019-11-09 DIAGNOSIS — Z91199 Patient's noncompliance with other medical treatment and regimen due to unspecified reason: Secondary | ICD-10-CM

## 2019-11-09 NOTE — BH Specialist Note (Signed)
Pt did not arrive to video visit and did not answer the phone ; Voicemail full, so unable to leave voice message; left MyChart message for patient.    Integrated Behavioral Health via Telemedicine Video (Caregility) Visit  11/09/2019 Dana Hughes [phone]  [place] Fair

## 2019-11-11 ENCOUNTER — Telehealth (INDEPENDENT_AMBULATORY_CARE_PROVIDER_SITE_OTHER): Payer: Self-pay | Admitting: Obstetrics and Gynecology

## 2019-11-11 DIAGNOSIS — R102 Pelvic and perineal pain: Secondary | ICD-10-CM

## 2019-11-11 NOTE — Telephone Encounter (Signed)
Patient is requesting a nurse call her

## 2019-11-12 ENCOUNTER — Encounter (HOSPITAL_COMMUNITY): Payer: Self-pay | Admitting: Emergency Medicine

## 2019-11-12 ENCOUNTER — Ambulatory Visit (HOSPITAL_COMMUNITY)
Admission: EM | Admit: 2019-11-12 | Discharge: 2019-11-12 | Disposition: A | Discharge status: Home / Self Care | Payer: Self-pay | Attending: Emergency Medicine | Admitting: Emergency Medicine

## 2019-11-12 ENCOUNTER — Other Ambulatory Visit: Payer: Self-pay

## 2019-11-12 DIAGNOSIS — E876 Hypokalemia: Secondary | ICD-10-CM | POA: Insufficient documentation

## 2019-11-12 DIAGNOSIS — R109 Unspecified abdominal pain: Secondary | ICD-10-CM

## 2019-11-12 DIAGNOSIS — E119 Type 2 diabetes mellitus without complications: Secondary | ICD-10-CM | POA: Insufficient documentation

## 2019-11-12 DIAGNOSIS — Z3202 Encounter for pregnancy test, result negative: Secondary | ICD-10-CM

## 2019-11-12 LAB — POCT URINALYSIS DIP (DEVICE)
Bilirubin Urine: NEGATIVE
Glucose, UA: >=1000 mg/dL — AB
Ketones, ur: NEGATIVE mg/dL
Leukocytes,Ua: NEGATIVE
Nitrite: NEGATIVE
Protein, ur: NEGATIVE mg/dL
Specific Gravity, Urine: 1.01 (ref 1.005–1.030)
Urobilinogen, UA: 0.2 mg/dL (ref 0.0–1.0)
pH: 6 (ref 5.0–8.0)

## 2019-11-12 LAB — COMPREHENSIVE METABOLIC PANEL WITH GFR
ALT: 23 U/L (ref 0–44)
AST: 15 U/L (ref 15–41)
Albumin: 4.2 g/dL (ref 3.5–5.0)
Alkaline Phosphatase: 48 U/L (ref 38–126)
Anion gap: 9 (ref 5–15)
BUN: <5 mg/dL — ABNORMAL LOW (ref 6–20)
CO2: 23 mmol/L (ref 22–32)
Calcium: 9.6 mg/dL (ref 8.9–10.3)
Chloride: 106 mmol/L (ref 98–111)
Creatinine, Ser: 0.72 mg/dL (ref 0.44–1.00)
GFR calc Af Amer: >60 mL/min
GFR calc non Af Amer: >60 mL/min
Glucose, Bld: 306 mg/dL — ABNORMAL HIGH (ref 70–99)
Potassium: 2.9 mmol/L — ABNORMAL LOW (ref 3.5–5.1)
Sodium: 138 mmol/L (ref 135–145)
Total Bilirubin: 0.4 mg/dL (ref 0.3–1.2)
Total Protein: 7.3 g/dL (ref 6.5–8.1)

## 2019-11-12 LAB — POC URINE PREG, ED: Preg Test, Ur: NEGATIVE

## 2019-11-12 LAB — CBG MONITORING, ED: Glucose-Capillary: 326 mg/dL — ABNORMAL HIGH (ref 70–99)

## 2019-11-12 LAB — HEMOGLOBIN A1C
Hgb A1c MFr Bld: 11.1 % — ABNORMAL HIGH (ref 4.8–5.6)
Mean Plasma Glucose: 271.87 mg/dL

## 2019-11-12 MED ORDER — METFORMIN HCL 500 MG PO TABS
500.0000 mg | ORAL_TABLET | Freq: Every day | ORAL | 0 refills | Status: DC
Start: 2019-11-12 — End: 2019-12-09

## 2019-11-12 MED ORDER — GLUCOSE BLOOD VI STRP: ORAL_STRIP | 2 refills | Status: DC

## 2019-11-12 MED ORDER — FREESTYLE SYSTEM KIT
1.0000 | PACK | Freq: Every day | 0 refills | Status: DC
Start: 2019-11-12 — End: 2022-02-09

## 2019-11-12 MED ORDER — IBUPROFEN 600 MG PO TABS
600.0000 mg | ORAL_TABLET | Freq: Four times a day (QID) | ORAL | 0 refills | Status: DC | PRN
Start: 2019-11-12 — End: 2020-05-05

## 2019-11-12 MED ORDER — FREESTYLE SYSTEM KIT
1.0000 | PACK | Freq: Every day | 0 refills | Status: DC
Start: 2019-11-12 — End: 2019-11-12

## 2019-11-12 NOTE — ED Provider Notes (Signed)
HPI  SUBJECTIVE:  Dana Hughes is a 39 y.o. female who presents with worsening left sided abdominal pain and swelling over the past week.  States that she has had this ongoing for "years".  It is not changed in character or location over this past week.  It is sharp, nonmigratory, nonradiating, last 10 to 20 seconds.  She states that she has felt feverish, but has not measured her temperature.  She reports nausea today.  No vomiting.  She reports constipation alternating with diarrhea and loose stools.  She reports increased thirst, increased urination, 9 pound unintentional weight loss over the past week and early satiety.  No chest pain, shortness of breath, GERD symptoms, urinary complaints.  She has tried Aleve for her symptoms with improvement.  She has also tried cranberry juice and cranberry pills.  No aggravating factors.  Her abdominal pain is not associated eating, drinking, urination, defecation, movement.  The absdominal swelling seems to be present when she stands up and goes away when she lies down.  She also mentions headaches, dizziness and general sense of not feeling well over the past week.  She has a past medical history of endometriosis, pelvic pain, hypertension, fibroids, ovarian cyst.  Past medical history negative for IBS, peptic ulcer disease, other abdominal surgeries, diabetes.  Family history positive for diabetes.  LMP: Over the past 3 months.  She is not sure if she could be pregnant.  PMD: Dr. Irving, OB/GYN.  PMD: None.   Past Medical History:  Diagnosis Date  . Anxiety   . Arthritis   . Asthma   . BV (bacterial vaginosis)   . Candidiasis   . Depression   . Headache(784.0)    migraines  . Hypertension    no longer takes meds  . Migraines   . Muscle spasm   . Ovarian cyst   . Scoliosis     Past Surgical History:  Procedure Laterality Date  . CHOLECYSTECTOMY    . TUBAL LIGATION      Family History  Problem Relation Age of Onset  . Cancer Father   .  Hypertension Maternal Aunt   . Breast cancer Maternal Aunt   . Cancer Paternal Grandmother   . Breast cancer Paternal Grandmother   . Breast cancer Paternal Aunt     Social History   Tobacco Use  . Smoking status: Never Smoker  . Smokeless tobacco: Never Used  Substance Use Topics  . Alcohol use: Not Currently    Comment: rare  . Drug use: Yes    Frequency: 1.0 times per week    Types: Marijuana     Current Facility-Administered Medications:  .  medroxyPROGESTERone (DEPO-PROVERA) injection 150 mg, 150 mg, Intramuscular, Q90 days, Ervin, Michael L, MD, 150 mg at 10/29/19 1032  Current Outpatient Medications:  .  albuterol (PROVENTIL HFA;VENTOLIN HFA) 108 (90 Base) MCG/ACT inhaler, Inhale 2 puffs into the lungs every 6 (six) hours as needed for wheezing or shortness of breath. Reported on 09/29/2015, Disp: , Rfl:  .  albuterol (PROVENTIL) (2.5 MG/3ML) 0.083% nebulizer solution, Take 3 mLs (2.5 mg total) by nebulization every 6 (six) hours as needed for wheezing or shortness of breath., Disp: 75 mL, Rfl: 0 .  amLODipine (NORVASC) 5 MG tablet, Take 1 tablet (5 mg total) by mouth daily., Disp: 90 tablet, Rfl: 1 .  artificial tears (LACRILUBE) OINT ophthalmic ointment, Place into both eyes at bedtime as needed for dry eyes., Disp: 3.5 g, Rfl: 0 .  glucose   blood test strip, Check your sugar in the morning before you eat breakfast, and one hour after a meal., Disp: 100 each, Rfl: 2 .  glucose monitoring kit (FREESTYLE) monitoring kit, 1 each by Does not apply route daily. Check glucose once in the morning before breakfast and 1 hour after a meal, Disp: 1 each, Rfl: 0 .  ibuprofen (ADVIL) 600 MG tablet, Take 1 tablet (600 mg total) by mouth every 6 (six) hours as needed., Disp: 30 tablet, Rfl: 0 .  meclizine (ANTIVERT) 25 MG tablet, Take 1 tablet (25 mg total) by mouth 3 (three) times daily as needed for dizziness. (Patient not taking: Reported on 07/21/2018), Disp: 30 tablet, Rfl: 0 .   medroxyPROGESTERone (PROVERA) 10 MG tablet, Take 2 tablets (20 mg total) by mouth daily., Disp: 60 tablet, Rfl: 2 .  metFORMIN (GLUCOPHAGE) 500 MG tablet, Take 1 tablet (500 mg total) by mouth daily with breakfast. 1 tab po in the a.m. for 1 week, 1 tab twice a day for 1 week, then 1 tab in am and 2 tabs at night for the next week, Disp: 42 tablet, Rfl: 0 .  methocarbamol (ROBAXIN) 500 MG tablet, Take 1 tablet (500 mg total) by mouth 2 (two) times daily as needed for muscle spasms., Disp: 20 tablet, Rfl: 0 .  norethindrone (MICRONOR) 0.35 MG tablet, Take 1 tablet (0.35 mg total) by mouth daily., Disp: 1 Package, Rfl: 11 .  PE-DM-APAP & Doxylamin-DM-APAP (VICKS DAYQUIL/NYQUIL CLD & FLU) (Liquid) MISC, Take 30 mLs by mouth 2 (two) times daily as needed (cough)., Disp: , Rfl:  .  SUMAtriptan (IMITREX) 100 MG tablet, Take 1 tablet (100 mg total) by mouth every 2 (two) hours as needed for migraine. May repeat in 2 hours if headache persists or recurs. No more than 2 in a 24 hour period, Disp: 10 tablet, Rfl: 0  Allergies  Allergen Reactions  . Mushroom Extract Complex Anaphylaxis  . Latex Itching and Swelling  . Penicillins Itching    Has patient had a PCN reaction causing immediate rash, facial/tongue/throat swelling, SOB or lightheadedness with hypotension: No Has patient had a PCN reaction causing severe rash involving mucus membranes or skin necrosis: No Has patient had a PCN reaction that required hospitalization No Has patient had a PCN reaction occurring within the last 10 years: No If all of the above answers are "NO", then may proceed with Cephalosporin use.      ROS  As noted in HPI.   Physical Exam  BP 118/85   Pulse 86   Temp 98 F (36.7 C)   Resp 16   SpO2 100%   Constitutional: Well developed, well nourished, no acute distress Eyes:  EOMI, conjunctiva normal bilaterally HENT: Normocephalic, atraumatic,mucus membranes moist Respiratory: Normal inspiratory effort, lungs  clear bilaterally  cardiovascular: Normal rate regular rhythm, no murmurs rubs or gallops GI: Normal appearance.  Positive mild tender swelling left upper quadrant that reduces when she lies down.  No other abdominal tenderness.  Negative Murphy, negative McBurney.  No guarding, rebound.  Active bowel sounds. Back: No CVAT. skin: No rash, skin intact Musculoskeletal: no deformities Neurologic: Alert & oriented x 3, no focal neuro deficits Psychiatric: Speech and behavior appropriate   ED Course   Medications - No data to display  Orders Placed This Encounter  Procedures  . Comprehensive metabolic panel    Standing Status:   Standing    Number of Occurrences:   1  . Hemoglobin A1c      Standing Status:   Standing    Number of Occurrences:   1  . POC CBG monitoring    Standing Status:   Standing    Number of Occurrences:   1  . POC urine pregnancy    Standing Status:   Standing    Number of Occurrences:   1  . POCT urinalysis dip (device)    Standing Status:   Standing    Number of Occurrences:   1    Results for orders placed or performed during the hospital encounter of 11/12/19 (from the past 24 hour(s))  POCT urinalysis dip (device)     Status: Abnormal   Collection Time: 11/12/19  1:44 PM  Result Value Ref Range   Glucose, UA >=1000 (A) NEGATIVE mg/dL   Bilirubin Urine NEGATIVE NEGATIVE   Ketones, ur NEGATIVE NEGATIVE mg/dL   Specific Gravity, Urine 1.010 1.005 - 1.030   Hgb urine dipstick MODERATE (A) NEGATIVE   pH 6.0 5.0 - 8.0   Protein, ur NEGATIVE NEGATIVE mg/dL   Urobilinogen, UA 0.2 0.0 - 1.0 mg/dL   Nitrite NEGATIVE NEGATIVE   Leukocytes,Ua NEGATIVE NEGATIVE  POC CBG monitoring     Status: Abnormal   Collection Time: 11/12/19  1:53 PM  Result Value Ref Range   Glucose-Capillary 326 (H) 70 - 99 mg/dL  POC urine pregnancy     Status: None   Collection Time: 11/12/19  1:56 PM  Result Value Ref Range   Preg Test, Ur NEGATIVE NEGATIVE  Comprehensive  metabolic panel     Status: Abnormal   Collection Time: 11/12/19  2:01 PM  Result Value Ref Range   Sodium 138 135 - 145 mmol/L   Potassium 2.9 (L) 3.5 - 5.1 mmol/L   Chloride 106 98 - 111 mmol/L   CO2 23 22 - 32 mmol/L   Glucose, Bld 306 (H) 70 - 99 mg/dL   BUN <5 (L) 6 - 20 mg/dL   Creatinine, Ser 0.72 0.44 - 1.00 mg/dL   Calcium 9.6 8.9 - 10.3 mg/dL   Total Protein 7.3 6.5 - 8.1 g/dL   Albumin 4.2 3.5 - 5.0 g/dL   AST 15 15 - 41 U/L   ALT 23 0 - 44 U/L   Alkaline Phosphatase 48 38 - 126 U/L   Total Bilirubin 0.4 0.3 - 1.2 mg/dL   GFR calc non Af Amer >60 >60 mL/min   GFR calc Af Amer >60 >60 mL/min   Anion gap 9 5 - 15  Hemoglobin A1c     Status: Abnormal   Collection Time: 11/12/19  2:01 PM  Result Value Ref Range   Hgb A1c MFr Bld 11.1 (H) 4.8 - 5.6 %   Mean Plasma Glucose 271.87 mg/dL   No results found.  ED Clinical Impression  1. Diabetes mellitus, new onset [facility])      ED Assessment/Plan  Wonder if patient does not have a reducible hernia as cause of her symptoms.  She states that she has been evaluated for this before, and it has been thought to be due to "fibroids" but given the location of the swelling, suspect more hernia than anything else.  Also in the differential is colonic spasm.  The symptoms have been going on for years.  Do not think that patient needs an emergent CT of the abdomen today.  Her abdomen is benign.  We will have her follow-up with her primary care provider for subspecialty referral.  Since NSAIDs work for her, we will  also send home with 600 mg of ibuprofen.  Her symptoms are also concerning for diabetes.  Will check fingerstick, UA and urine pregnancy per patient request.  Hematuria noted.  Patient has had abnormal vaginal bleeding for the past 3 months.  She does however have glucosuria.  No ketones.   Patient with diabetes.  Fingerstick 374.  No ketones in the urine.  she is not pregnant.  Sending off CMP, hemoglobin A1c for baseline and  will follow up on these labs.  Will call if necessary.  Will send home with Metformin, glucometer with strips, check her sugar twice a day, keep a log of this.  Will provide primary care list for ongoing care.  Emphasized importance of finding a primary care physician.  Hemoglobin A1c 11.1.  Phone number: [phone]  CMP reviewed.  Patient is hypokalemic at 2.9.  Glucose 306.  She is not acidotic, so little concern for DKA.  However, given the potassium and her other complaints of malaise, headache, abdominal pain concerned that she is symptomatic because of the hypokalemia.  Called patient, discussed labs, and encouraged her to go to the emergency department for potassium repletion, and repeat labs.  Emphasized importance of going to the ED, and the dangers of hypokalemia.  She agrees to go to the ED now.  Discussed labs,  MDM, treatment plan, and plan for follow-up with patient. Discussed sn/sx that should prompt return to the ED. patient agrees with plan.   Meds ordered this encounter  Medications  . glucose blood test strip    Sig: Check your sugar in the morning before you eat breakfast, and one hour after a meal.    Dispense:  100 each    Refill:  2  . DISCONTD: glucose monitoring kit (FREESTYLE) monitoring kit    Sig: 1 each by Does not apply route daily. Check glucose once in the morning before breakfast and 1 hour after a meal    Dispense:  1 each    Refill:  0  . metFORMIN (GLUCOPHAGE) 500 MG tablet    Sig: Take 1 tablet (500 mg total) by mouth daily with breakfast. 1 tab po in the a.m. for 1 week, 1 tab twice a day for 1 week, then 1 tab in am and 2 tabs at night for the next week    Dispense:  42 tablet    Refill:  0  . ibuprofen (ADVIL) 600 MG tablet    Sig: Take 1 tablet (600 mg total) by mouth every 6 (six) hours as needed.    Dispense:  30 tablet    Refill:  0  . DISCONTD: glucose monitoring kit (FREESTYLE) monitoring kit    Sig: 1 each by Does not apply route daily.  Check glucose once in the morning before breakfast and 1 hour after a meal    Dispense:  1 each    Refill:  0  . glucose monitoring kit (FREESTYLE) monitoring kit    Sig: 1 each by Does not apply route daily. Check glucose once in the morning before breakfast and 1 hour after a meal    Dispense:  1 each    Refill:  0    *This clinic note was created using Dragon dictation software. Therefore, there may be occasional mistakes despite careful proofreading.   ?    , , MD 11/12/19 1651  

## 2019-11-12 NOTE — Discharge Instructions (Addendum)
May take 600 mg of ibuprofen as needed for abdominal pain.  I will call you only if I need to talk to you about your labs.  I expect your sugar and hemoglobin A1c to be high.  Keep a log of your glucose.  Measure it twice a day.  Please follow-up with a primary care provider of your choice as soon as you possibly can.  Is important to get your diabetes under control.  Go immediately to the emergency department for fevers above 100.4, if your glucose meter reads "high", if you start to feel worse, or for any other concerns  Below is a list of primary care practices who are taking new patients for you to follow-up with.  [facility] internal medicine clinic Ground Floor - [facility], [place], [place], [place] [address] [phone]  [facility] Primary Care at [facility] [address] [place], [place] [address] [phone]  [facility] [address], [place] [address] [phone]  [facility]/Internal Medicine [phone] [place] [address]  [facility]: [place]  [phone]  [place] [facility]: [place]   [phone]  [facility] Family [facility]: [address] [place]  [phone]  [place] [facility] : [address] [phone]  [facility] Primary Care: [address] [place] [address] [phone]  [facility]: [place] [phone]  Dr. Blanchie Serve [place]  [phone]  Dr. Benito Mccreedy, [facility]. [place], [place] [address]  [phone]  Go to www.goodrx.com  to look up your medications. This will give you a list of where you can find your prescriptions at the most affordable prices. Or ask the pharmacist what the cash price is, or if they have any other discount programs available to help make your medication more affordable. This can be less expensive than what you would pay with insurance.

## 2019-11-12 NOTE — ED Triage Notes (Addendum)
Pt c/o LUQ abdominal pain x 2 weeks with nausea, states some days she has diarrhea, some days she is constipated. Pt reports fever 2 days ago that resolved after drinking pedialyte. Also has had intermittent headaches since last visit here (6/26). States, "All of this is worse when I'm at work."

## 2019-11-13 ENCOUNTER — Emergency Department (HOSPITAL_COMMUNITY)
Admission: EM | Admit: 2019-11-13 | Discharge: 2019-11-13 | Disposition: A | Discharge status: Home / Self Care | Payer: Medicaid Other | Attending: Emergency Medicine | Admitting: Emergency Medicine

## 2019-11-13 ENCOUNTER — Other Ambulatory Visit: Payer: Self-pay

## 2019-11-13 DIAGNOSIS — J45909 Unspecified asthma, uncomplicated: Secondary | ICD-10-CM | POA: Insufficient documentation

## 2019-11-13 DIAGNOSIS — Z7984 Long term (current) use of oral hypoglycemic drugs: Secondary | ICD-10-CM | POA: Insufficient documentation

## 2019-11-13 DIAGNOSIS — E876 Hypokalemia: Secondary | ICD-10-CM | POA: Insufficient documentation

## 2019-11-13 DIAGNOSIS — B3731 Acute candidiasis of vulva and vagina: Secondary | ICD-10-CM

## 2019-11-13 DIAGNOSIS — R519 Headache, unspecified: Secondary | ICD-10-CM | POA: Insufficient documentation

## 2019-11-13 DIAGNOSIS — Z7951 Long term (current) use of inhaled steroids: Secondary | ICD-10-CM | POA: Insufficient documentation

## 2019-11-13 DIAGNOSIS — E1165 Type 2 diabetes mellitus with hyperglycemia: Secondary | ICD-10-CM | POA: Insufficient documentation

## 2019-11-13 DIAGNOSIS — B373 Candidiasis of vulva and vagina: Secondary | ICD-10-CM | POA: Insufficient documentation

## 2019-11-13 DIAGNOSIS — I10 Essential (primary) hypertension: Secondary | ICD-10-CM | POA: Insufficient documentation

## 2019-11-13 DIAGNOSIS — E119 Type 2 diabetes mellitus without complications: Secondary | ICD-10-CM | POA: Insufficient documentation

## 2019-11-13 DIAGNOSIS — R300 Dysuria: Secondary | ICD-10-CM | POA: Insufficient documentation

## 2019-11-13 DIAGNOSIS — N760 Acute vaginitis: Secondary | ICD-10-CM | POA: Insufficient documentation

## 2019-11-13 DIAGNOSIS — Z79899 Other long term (current) drug therapy: Secondary | ICD-10-CM | POA: Insufficient documentation

## 2019-11-13 DIAGNOSIS — R102 Pelvic and perineal pain: Secondary | ICD-10-CM | POA: Insufficient documentation

## 2019-11-13 DIAGNOSIS — Z91040 Latex allergy status: Secondary | ICD-10-CM | POA: Insufficient documentation

## 2019-11-13 DIAGNOSIS — R110 Nausea: Secondary | ICD-10-CM | POA: Insufficient documentation

## 2019-11-13 DIAGNOSIS — B9689 Other specified bacterial agents as the cause of diseases classified elsewhere: Secondary | ICD-10-CM

## 2019-11-13 LAB — URINALYSIS, ROUTINE W REFLEX MICROSCOPIC
Bacteria, UA: NONE SEEN
Bilirubin Urine: NEGATIVE
Glucose, UA: >=500 mg/dL — AB
Ketones, ur: 20 mg/dL — AB
Leukocytes,Ua: NEGATIVE
Nitrite: NEGATIVE
Protein, ur: NEGATIVE mg/dL
Specific Gravity, Urine: 1.033 — ABNORMAL HIGH (ref 1.005–1.030)
pH: 6 (ref 5.0–8.0)

## 2019-11-13 LAB — WET PREP, GENITAL
Sperm: NONE SEEN
Trich, Wet Prep: NONE SEEN
Yeast Wet Prep HPF POC: NONE SEEN

## 2019-11-13 LAB — CBC
HCT: 40.4 % (ref 36.0–46.0)
Hemoglobin: 13.8 g/dL (ref 12.0–15.0)
MCH: 29.4 pg (ref 26.0–34.0)
MCHC: 34.2 g/dL (ref 30.0–36.0)
MCV: 86.1 fL (ref 80.0–100.0)
Platelets: 408 10*3/uL — ABNORMAL HIGH (ref 150–400)
RBC: 4.69 MIL/uL (ref 3.87–5.11)
RDW: 13.5 % (ref 11.5–15.5)
WBC: 8.7 10*3/uL (ref 4.0–10.5)
nRBC: 0 % (ref 0.0–0.2)

## 2019-11-13 LAB — COMPREHENSIVE METABOLIC PANEL
ALT: 25 U/L (ref 0–44)
AST: 18 U/L (ref 15–41)
Albumin: 4.5 g/dL (ref 3.5–5.0)
Alkaline Phosphatase: 51 U/L (ref 38–126)
Anion gap: 13 (ref 5–15)
BUN: <5 mg/dL — ABNORMAL LOW (ref 6–20)
CO2: 21 mmol/L — ABNORMAL LOW (ref 22–32)
Calcium: 9.4 mg/dL (ref 8.9–10.3)
Chloride: 103 mmol/L (ref 98–111)
Creatinine, Ser: 0.71 mg/dL (ref 0.44–1.00)
GFR calc Af Amer: >60 mL/min (ref >60)
GFR calc non Af Amer: >60 mL/min (ref >60)
Glucose, Bld: 339 mg/dL — ABNORMAL HIGH (ref 70–99)
Potassium: 3 mmol/L — ABNORMAL LOW (ref 3.5–5.1)
Sodium: 137 mmol/L (ref 135–145)
Total Bilirubin: 0.4 mg/dL (ref 0.3–1.2)
Total Protein: 7.7 g/dL (ref 6.5–8.1)

## 2019-11-13 LAB — I-STAT BETA HCG BLOOD, ED (MC, WL, AP ONLY): I-stat hCG, quantitative: <5 m[IU]/mL (ref <5)

## 2019-11-13 LAB — CBG MONITORING, ED
Glucose-Capillary: 350 mg/dL — ABNORMAL HIGH (ref 70–99)
Glucose-Capillary: 228 mg/dL — ABNORMAL HIGH (ref 70–99)

## 2019-11-13 LAB — HIV ANTIBODY (ROUTINE TESTING W REFLEX): HIV Screen 4th Generation wRfx: NONREACTIVE

## 2019-11-13 LAB — LIPASE, BLOOD: Lipase: 38 U/L (ref 11–51)

## 2019-11-13 MED ORDER — ONDANSETRON HCL 4 MG/2ML IJ SOLN
4.0000 mg | Freq: Once | INTRAMUSCULAR | Status: AC
  Administered 2019-11-13: 4 mg via INTRAVENOUS
  Filled 2019-11-13: qty 2

## 2019-11-13 MED ORDER — POTASSIUM CHLORIDE 10 MEQ/100ML IV SOLN
10.0000 meq | INTRAVENOUS | Status: AC
  Administered 2019-11-13 (×2): 10 meq via INTRAVENOUS
  Filled 2019-11-13 (×2): qty 100

## 2019-11-13 MED ORDER — SODIUM CHLORIDE 0.9 % IV BOLUS
1000.0000 mL | Freq: Once | INTRAVENOUS | Status: AC
  Administered 2019-11-13: 1000 mL via INTRAVENOUS

## 2019-11-13 MED ORDER — LIVING WELL WITH DIABETES BOOK
Freq: Once | Status: DC
  Filled 2019-11-13: qty 1

## 2019-11-13 MED ORDER — INSULIN ASPART 100 UNIT/ML ~~LOC~~ SOLN
4.0000 [IU] | Freq: Once | SUBCUTANEOUS | Status: AC
  Administered 2019-11-13: 4 [IU] via SUBCUTANEOUS
  Filled 2019-11-13: qty 0.04

## 2019-11-13 MED ORDER — POTASSIUM CHLORIDE CRYS ER 20 MEQ PO TBCR
40.0000 meq | EXTENDED_RELEASE_TABLET | Freq: Once | ORAL | Status: AC
  Administered 2019-11-13: 40 meq via ORAL
  Filled 2019-11-13: qty 2

## 2019-11-13 NOTE — Discharge Instructions (Addendum)
Start taking the Metformin prescribed to you by urgenct care. Check your blood sugars twice daily. Aim for blood sugar less than 200 however this may take time for the Metformin to work.  Decrease your carbohydrates such as sodas, crackers, rice, pasta, potatoes.  I have placed an order for the Diabetes Nutritionist to reach out to you to schedule and appointment.

## 2019-11-13 NOTE — ED Triage Notes (Signed)
Patient reports to the ER for hyperglycemia. Patient reports she has no formal diagnosis of hyperglycemia. Patient reports her CBG yesterday was 329.

## 2019-11-13 NOTE — ED Provider Notes (Signed)
[facility] DEPT Provider Note   CSN: [id number] Arrival date & time: 11/13/19  1026     History Chief Complaint  Patient presents with  . Hyperglycemia    Dana Hughes is a 39 y.o. female with past medical history significant for anxiety, arthritis, BV, migraines, hypertension, recent diagnosis of diabetes who presents for evaluation of hyperglycemia.  Patient states she is had polyuria, polydipsia.  Was seen by urgent care yesterday.  Was noted to have an elevated blood sugar into the 300s.  She was started on Metformin and given a glucose monitor and told to take her blood sugar twice a day however she has not started this.  Patient states she was told that she had low potassium and needed to be evaluated here in the emergency department.  Patient states she has had chronic left-sided lower pelvic pain.  Pain occurs when she stands up and is resolved when she lays down.  This has been occurring times years.  She is not to take anything for the pain.  If she lays flat the symptoms resolved.  She has no current pain.  She is mildly nauseous however without emesis.  No diarrhea.  Says she does get occasional headaches however does not have 1 currently.  She did not have anything to eat or drink today.  Denies lightheadedness, dizziness, chest pain, shortness of breath, abdominal pain, vaginal bleeding, vaginal discharge, no concerns for STDs.  She was seen by Dr. Rip Harbour with OB/GYN for pelvic pain and had ultrasound performed.  Was diagnosed with intramural leiomyomata and right ovaria cyst.  Denies additional aggravating or alleviating factors.  History obtained from patient and past medical records. No interpretor was used.  HPI     Past Medical History:  Diagnosis Date  . Anxiety   . Arthritis   . Asthma   . BV (bacterial vaginosis)   . Candidiasis   . Depression   . Headache(784.0)    migraines  . Hypertension    no longer takes meds  . Migraines    . Muscle spasm   . Ovarian cyst   . Scoliosis     Patient Active Problem List   Diagnosis Date Noted  . Abnormal uterine bleeding (AUB) 02/20/2019  . History of bilateral tubal ligation 02/20/2019  . Carpal tunnel syndrome, bilateral 01/16/2019  . Hypertension 07/21/2018  . Endometriosis 08/30/2017  . Pelvic pain in female 09/29/2015    Past Surgical History:  Procedure Laterality Date  . CHOLECYSTECTOMY    . TUBAL LIGATION       OB History    Gravida  4   Para  3   Term  3   Preterm  0   AB  1   Living  3     SAB  1   TAB  0   Ectopic  0   Multiple  0   Live Births  3        Obstetric Comments  SVD x 3        Family History  Problem Relation Age of Onset  . Cancer Father   . Hypertension Maternal Aunt   . Breast cancer Maternal Aunt   . Cancer Paternal Grandmother   . Breast cancer Paternal Grandmother   . Breast cancer Paternal Aunt     Social History   Tobacco Use  . Smoking status: Never Smoker  . Smokeless tobacco: Never Used  Substance Use Topics  . Alcohol use: Not  Currently    Comment: rare  . Drug use: Yes    Frequency: 1.0 times per week    Types: Marijuana    Home Medications Prior to Admission medications   Medication Sig Start Date End Date Taking? Authorizing Provider  albuterol (PROVENTIL HFA;VENTOLIN HFA) 108 (90 Base) MCG/ACT inhaler Inhale 2 puffs into the lungs every 6 (six) hours as needed for wheezing or shortness of breath. Reported on 09/29/2015    [provider]  albuterol (PROVENTIL) (2.5 MG/3ML) 0.083% nebulizer solution Take 3 mLs (2.5 mg total) by nebulization every 6 (six) hours as needed for wheezing or shortness of breath. 07/02/18   Wurst, [place], PA-C  amLODipine (NORVASC) 5 MG tablet Take 1 tablet (5 mg total) by mouth daily. 10/14/19   Faustino Congress, NP  artificial tears (LACRILUBE) OINT ophthalmic ointment Place into both eyes at bedtime as needed for dry eyes. 05/02/18   Langston Masker B, PA-C  glucose blood test strip Check your sugar in the morning before you eat breakfast, and one hour after a meal. 11/12/19   Melynda Ripple, MD  glucose monitoring kit (FREESTYLE) monitoring kit 1 each by Does not apply route daily. Check glucose once in the morning before breakfast and 1 hour after a meal 11/12/19   Melynda Ripple, MD  ibuprofen (ADVIL) 600 MG tablet Take 1 tablet (600 mg total) by mouth every 6 (six) hours as needed. 11/12/19   Melynda Ripple, MD  meclizine (ANTIVERT) 25 MG tablet Take 1 tablet (25 mg total) by mouth 3 (three) times daily as needed for dizziness. Patient not taking: Reported on 07/21/2018 05/06/18   Melynda Ripple, MD  medroxyPROGESTERone (PROVERA) 10 MG tablet Take 2 tablets (20 mg total) by mouth daily. 09/04/19   Truett Mainland, DO  metFORMIN (GLUCOPHAGE) 500 MG tablet Take 1 tablet (500 mg total) by mouth daily with breakfast. 1 tab po in the a.m. for 1 week, 1 tab twice a day for 1 week, then 1 tab in am and 2 tabs at night for the next week 11/12/19   Melynda Ripple, MD  methocarbamol (ROBAXIN) 500 MG tablet Take 1 tablet (500 mg total) by mouth 2 (two) times daily as needed for muscle spasms. 01/13/19   Sharion Balloon, NP  norethindrone (MICRONOR) 0.35 MG tablet Take 1 tablet (0.35 mg total) by mouth daily. 06/29/19   Chancy Milroy, MD  PE-DM-APAP & Doxylamin-DM-APAP (VICKS DAYQUIL/NYQUIL CLD & FLU) (Liquid) MISC Take 30 mLs by mouth 2 (two) times daily as needed (cough).    [provider]  SUMAtriptan (IMITREX) 100 MG tablet Take 1 tablet (100 mg total) by mouth every 2 (two) hours as needed for migraine. May repeat in 2 hours if headache persists or recurs. No more than 2 in a 24 hour period 10/17/19   Bjorn Pippin, PA-C    Allergies    Mushroom extract complex, Latex, and Penicillins  Review of Systems   Review of Systems  Constitutional: Negative.   HENT: Negative.   Eyes: Negative.   Respiratory: Negative.     Cardiovascular: Negative.   Gastrointestinal: Positive for nausea. Negative for abdominal distention, abdominal pain, anal bleeding, blood in stool, constipation, diarrhea, rectal pain and vomiting.  Endocrine: Positive for polydipsia and polyuria. Negative for cold intolerance, heat intolerance and polyphagia.  Genitourinary: Positive for dysuria, frequency, pelvic pain and urgency. Negative for decreased urine volume, difficulty urinating, enuresis, flank pain, genital sores, hematuria, menstrual problem, vaginal bleeding, vaginal discharge and  vaginal pain.  Musculoskeletal: Negative.   Skin: Negative.   Neurological: Positive for headaches (2 days ago, none currently). Negative for dizziness, tremors, seizures, syncope, facial asymmetry, speech difficulty, weakness, light-headedness and numbness.  All other systems reviewed and are negative.   Physical Exam Updated Vital Signs BP (!) 124/87   Pulse 86   Temp 98.9 F (37.2 C) (Oral)   Resp 18   Ht 5' 3" (1.6 m)   Wt (!) 93.4 kg   SpO2 99%   BMI 36.49 kg/m   Physical Exam Vitals and nursing note reviewed.  Constitutional:      General: She is not in acute distress.    Appearance: She is well-developed. She is not ill-appearing, toxic-appearing or diaphoretic.  HENT:     Head: Normocephalic and atraumatic.     Nose: Nose normal.     Mouth/Throat:     Mouth: Mucous membranes are moist.  Eyes:     Extraocular Movements: Extraocular movements intact.     Pupils: Pupils are equal, round, and reactive to light.  Neck:     Trachea: Trachea and phonation normal.     Comments: No meningismus Cardiovascular:     Rate and Rhythm: Normal rate.     Pulses: Normal pulses.     Heart sounds: Normal heart sounds.  Pulmonary:     Effort: Pulmonary effort is normal. No respiratory distress.     Breath sounds: Normal breath sounds.  Abdominal:     General: Bowel sounds are normal. There is no distension.     Palpations: Abdomen is  soft.     Tenderness: There is no abdominal tenderness. There is no right CVA tenderness, left CVA tenderness, guarding or rebound. Negative signs include Murphy's sign and McBurney's sign.     Hernia: No hernia is present.     Comments: Soft, nontender.  No evidence of incarcerated strangulated hernias.  Genitourinary:    Comments: Normal appearing external female genitalia without rashes or lesions, normal vaginal epithelium. Normal appearing cervix with white discharge or petechiae. Cervical os is open, on cycle. There is bleeding noted at the os. Moderate Odor. Bimanual: No CMT,  nontender.  No palpable adnexal masses or tenderness. Uterus midline and not fixed. Rectovaginal exam was deferred.  No cystocele or rectocele noted. No pelvic lymphadenopathy noted. Wet prep was obtained.  Cultures for gonorrhea and chlamydia collected. Exam performed with chaperone in room. White discharge to BL labia consistent with yeast. Musculoskeletal:        General: Normal range of motion.     Cervical back: Full passive range of motion without pain and normal range of motion.     Comments: No bony tenderness.  Moves all 4 extremities at difficulty.  Compartments soft.  Lymphadenopathy:     Cervical: No cervical adenopathy.  Skin:    General: Skin is warm and dry.     Capillary Refill: Capillary refill takes less than 2 seconds.     Comments: Brisk capillary refill.  No edema, erythema or warmth.  No fluctuance or induration.  Neurological:     Mental Status: She is alert.     Comments: Mental Status:  Alert, oriented, thought content appropriate. Speech fluent without evidence of aphasia. Able to follow 2 step commands without difficulty.  Cranial Nerves:  II:  Peripheral visual fields grossly normal, pupils equal, round, reactive to light III,IV, VI: ptosis not present, extra-ocular motions intact bilaterally  V,VII: smile symmetric, facial light touch sensation equal VIII: hearing  grossly normal  bilaterally  IX,X: midline uvula rise  XI: bilateral shoulder shrug equal and strong XII: midline tongue extension  Motor:  5/5 in upper and lower extremities bilaterally including strong and equal grip strength and dorsiflexion/plantar flexion Sensory: Pinprick and light touch normal in all extremities.  Deep Tendon Reflexes: 2+ and symmetric  Cerebellar: normal finger-to-nose with bilateral upper extremities Gait: normal gait and balance CV: distal pulses palpable throughout       ED Results / Procedures / Treatments   Labs (all labs ordered are listed, but only abnormal results are displayed) Labs Reviewed  WET PREP, GENITAL - Abnormal; Notable for the following components:      Result Value   Clue Cells Wet Prep HPF POC PRESENT (*)    WBC, Wet Prep HPF POC FEW (*)    All other components within normal limits  CBC - Abnormal; Notable for the following components:   Platelets 408 (*)    All other components within normal limits  URINALYSIS, ROUTINE W REFLEX MICROSCOPIC - Abnormal; Notable for the following components:   Specific Gravity, Urine 1.033 (*)    Glucose, UA >=500 (*)    Hgb urine dipstick MODERATE (*)    Ketones, ur 20 (*)    All other components within normal limits  COMPREHENSIVE METABOLIC PANEL - Abnormal; Notable for the following components:   Potassium 3.0 (*)    CO2 21 (*)    Glucose, Bld 339 (*)    BUN <5 (*)    All other components within normal limits  CBG MONITORING, ED - Abnormal; Notable for the following components:   Glucose-Capillary 350 (*)    All other components within normal limits  CBG MONITORING, ED - Abnormal; Notable for the following components:   Glucose-Capillary 228 (*)    All other components within normal limits  URINE CULTURE  LIPASE, BLOOD  RPR  HIV ANTIBODY (ROUTINE TESTING W REFLEX)  I-STAT BETA HCG BLOOD, ED ([facility], [facility], [facility] ONLY)  CBG MONITORING, ED  GC/CHLAMYDIA PROBE AMP ([place]) NOT AT [facility]     EKG None  Radiology No results found.  Procedures Procedures (including critical care time)  Medications Ordered in ED Medications  living well with diabetes book MISC (has no administration in time range)  potassium chloride 10 mEq in 100 mL IVPB (10 mEq Intravenous New Bag/Given 11/13/19 1535)  sodium chloride 0.9 % bolus 1,000 mL (0 mLs Intravenous Stopped 11/13/19 1251)  insulin aspart (novoLOG) injection 4 Units (4 Units Subcutaneous Given 11/13/19 1216)  ondansetron (ZOFRAN) injection 4 mg (4 mg Intravenous Given 11/13/19 1214)  potassium chloride SA (KLOR-CON) CR tablet 40 mEq (40 mEq Oral Given 11/13/19 1428)    ED Course  I have reviewed the triage vital signs and the nursing notes.  Pertinent labs & imaging results that were available during my care of the patient were reviewed by me and considered in my medical decision making (see chart for details).  39 year old presents for evaluation of polyuria and polydipsia.  Was seen yesterday at urgent care and had elevated blood sugar into the 300s.  Also noted to have low potassium.  Does have some dysuria however denies any concerns for STDs, no vaginal discharge.  She does have intermittent left-sided pelvic pain which has been occurring times years.  She states that she feels pelvic pressure when she lays flat this resolves.  She has no current pain.  Her abdomen is soft, nontender.  No evidence of incarcerated or  strangulated hernias on exam.  Her heart and lungs are clear.  She has a nonfocal neuro exam without deficits.  No sudden onset thunderclap headache, no meningismus.  Plan on labs and reassess.  Labs and imaging personally reviewed and interpreted: CBG 350 Preg negative CBC without leukocytosis CMP hypokalemia at 3.0 will replace, Glucose at 339 Lipase Wet prep with BV UA with blood however on menstrual cycle GC, Chlamydia, RPR, HIV pending EKG without STEMI  GU exam with yeast to bilateral labia consistent  with yeast. Moderate odor. NO CMT, adnexal tenderness.  Reevaluation patient states the pain that she intermittently gets has been recurrent times years.  Has been seen by gynecology for this and had ultrasound.  This was due to endometriosis.  She denies any current pain.  Not feel patient needs emergent imaging as she does not have any pain currently.  Patient does not meet the SIRS or Sepsis criteria.  On repeat exam patient does not have a surgical abdomin and there are no peritoneal signs.  No indication of appendicitis, bowel obstruction, bowel perforation, cholecystitis, diverticulitis, PID or ectopic pregnancy.  Low suspicion for DKA On labs today.  Patient reassessed. No complaints. She is drinking a regular Sprite. Discussed diet options and diet consistent with new onset DM. Does have PCP with [facility] Internal Medicine Resident clinic. Will have follow up with them closely outpatient. Ref for DM nutritionist placed in [place].  The patient has been appropriately medically screened and/or stabilized in the ED. I have low suspicion for any other emergent medical condition which would require further screening, evaluation or treatment in the ED or require inpatient management.  Patient is hemodynamically stable and in no acute distress.  Patient able to ambulate in department prior to ED.  Evaluation does not show acute pathology that would require ongoing or additional emergent interventions while in the emergency department or further inpatient treatment.  I have discussed the diagnosis with the patient and answered all questions.  Pain is been managed while in the emergency department and patient has no further complaints prior to discharge.  Patient is comfortable with plan discussed in room and is stable for discharge at this time.  I have discussed strict return precautions for returning to the emergency department.  Patient was encouraged to follow-up with PCP/specialist refer to at discharge.      MDM Rules/Calculators/A&P                           Final Clinical Impression(s) / ED Diagnoses Final diagnoses:  Hyperglycemia due to diabetes mellitus (HCC)  Hypokalemia  Bacterial vaginosis  Yeast vaginitis    Rx / DC Orders ED Discharge Orders         Ordered    Ambulatory referral to Nutrition and Diabetic Education     Discontinue  Reprint     11/13/19 1624           ,  A, PA-C 11/13/19 1626    Veryl Speak, MD 11/17/19 1507

## 2019-11-13 NOTE — ED Notes (Signed)
Pt verbalizes understanding of DC instructions. Pt belongings returned and is ambulatory out of ED.  

## 2019-11-13 NOTE — ED Notes (Signed)
Pt provided with crackers and water.

## 2019-11-13 NOTE — ED Notes (Signed)
Offered pt crackers or cheese stick but pt declined.

## 2019-11-13 NOTE — Telephone Encounter (Signed)
Returned patients call. Patient reports she is hurting. She was at Urgent Care yesterday and was sent to [facility]. She has found out she has diabetes. She is being cared for currently at the hospital.   She reports she is waiting for Dr. Rip Harbour to come back to get the results of her diabetes testing. Informed patient that since she is not pregnant, we will need to refer her to her PCP. Advised patient to call Dr. Collene Gobble at discharge and make and appointment for follow up. Patient voiced understanding.

## 2019-11-14 LAB — URINE CULTURE: Culture: NO GROWTH

## 2019-11-14 LAB — RPR: RPR Ser Ql: NONREACTIVE

## 2019-11-16 ENCOUNTER — Encounter: Payer: Self-pay | Admitting: *Deleted

## 2019-11-16 LAB — GC/CHLAMYDIA PROBE AMP (~~LOC~~) NOT AT ARMC
Chlamydia: NEGATIVE
Comment: NEGATIVE
Comment: NORMAL
Neisseria Gonorrhea: NEGATIVE

## 2019-11-17 ENCOUNTER — Encounter: Payer: Medicaid Other | Attending: Family Medicine

## 2019-11-17 ENCOUNTER — Telehealth: Payer: Self-pay | Admitting: Dietician

## 2019-11-17 NOTE — Telephone Encounter (Signed)
Patient missed the first of 3 Diabetes Core Classes today. I attempted to call her but was unable to leave a message as her voice mail was full. I e-mailed her regarding this and asked that she call our office to reschedule.  Antonieta Iba, RD, LDN, CDCES

## 2019-11-18 ENCOUNTER — Telehealth: Payer: Self-pay | Admitting: *Deleted

## 2019-11-18 NOTE — Telephone Encounter (Signed)
TOC CM spoke to pt and appt arranged for 12/10/2019 at 9:10 am at [facility]. Explained to pt if she is unable to keep appt to call to reschedule. ED provider updated. [place], [place] ED TOC CM [phone]

## 2019-11-20 ENCOUNTER — Telehealth: Payer: Self-pay | Admitting: *Deleted

## 2019-11-20 NOTE — Telephone Encounter (Signed)
Dana Hughes called and asked to speak with a nurse. She reports she was recently diagnosed with Diabetes and seen at [facility]. She reports she has been having blurred vision last 2 days and cbg's going high and low.  She has been referred to [facility] and will be seen there 12/10/19.  I explained this is not a Gynecology issue and is likely related to her diabetes  and I encouraged her to call [facility] and report her issues and see if they can see her sooner and make referral to eye doctor as they will be her PCP. I also discussed with Dr. Ilda Basset and he advised her if they cannot see her ; she should go to Urgent care.She voices understanding.  July Nickson,RN

## 2019-11-23 ENCOUNTER — Ambulatory Visit (INDEPENDENT_AMBULATORY_CARE_PROVIDER_SITE_OTHER): Payer: Self-pay | Admitting: Obstetrics and Gynecology

## 2019-11-23 ENCOUNTER — Encounter: Payer: Self-pay | Admitting: Obstetrics and Gynecology

## 2019-11-23 ENCOUNTER — Other Ambulatory Visit: Payer: Self-pay

## 2019-11-23 ENCOUNTER — Encounter: Payer: Self-pay | Admitting: Clinical

## 2019-11-23 ENCOUNTER — Ambulatory Visit (INDEPENDENT_AMBULATORY_CARE_PROVIDER_SITE_OTHER): Payer: Self-pay | Admitting: Clinical

## 2019-11-23 VITALS — BP 120/82 | HR 89 | Temp 99.2°F | Wt 203.0 lb

## 2019-11-23 DIAGNOSIS — Z658 Other specified problems related to psychosocial circumstances: Secondary | ICD-10-CM

## 2019-11-23 DIAGNOSIS — N939 Abnormal uterine and vaginal bleeding, unspecified: Secondary | ICD-10-CM

## 2019-11-23 DIAGNOSIS — N809 Endometriosis, unspecified: Secondary | ICD-10-CM

## 2019-11-23 DIAGNOSIS — Z9851 Tubal ligation status: Secondary | ICD-10-CM

## 2019-11-23 DIAGNOSIS — N921 Excessive and frequent menstruation with irregular cycle: Secondary | ICD-10-CM

## 2019-11-23 DIAGNOSIS — E119 Type 2 diabetes mellitus without complications: Secondary | ICD-10-CM

## 2019-11-23 MED ORDER — MEDROXYPROGESTERONE ACETATE 10 MG PO TABS: 20.0000 mg | ORAL_TABLET | Freq: Every day | ORAL | 2 refills | Status: DC

## 2019-11-23 NOTE — Progress Notes (Signed)
Screening scores are high. Pt states she has appt with Dutch Gray today at 1515

## 2019-11-23 NOTE — Progress Notes (Signed)
Patient ID: Dana Hughes, female   DOB: 28-Dec-1980, 39 y.o.   MRN: [id number] [name] presents for follow up. See prior office notes She reports that her pelvic pain is much improved. Still has some. Received second dose of Depo Provera last month. Has had BTB with Depo Provera and this has improved with BID Provera. Has lost 33 # since last visit Still desires tubal reversal. Dx with DM since last visit as well.  PE AF VSS Lungs clear Heart RRR Abd soft + BS GU deferred  A/P H/O endometriosis        H/O BTL        H/O uterine fibroids  Will continue with Depo Provera q 12 weeks for now. Will stop oral Provera and follow bleeding pattern. Hopefully bleeding will improve with continuation of Depo Provera. Instructed to follow up with Dr Elberta Leatherwood since wt loss for eval of tubal reversal and desire for pregnancy. F/U in 6 months

## 2019-11-23 NOTE — BH Specialist Note (Signed)
Integrated Behavioral Health Follow Up Visit  MRN: [id number] Name: Dana Hughes  Number of [place] Clinician visits: 2/6   Less than 5 minute joint in-person visit; pt primary concern today is life stress; Pt agrees to  virtual visit after returning home from medical visit today, due to scheduling conflict.   Pt did not arrive to video visit and did not answer the phone ; Voicemail is not set up, so unable to leave voice message; left MyChart message for patient.    [name] Tyrez Berrios, LCSW

## 2019-11-24 ENCOUNTER — Ambulatory Visit: Payer: Medicaid Other

## 2019-11-26 ENCOUNTER — Ambulatory Visit (HOSPITAL_COMMUNITY): Admission: EM | Admit: 2019-11-26 | Discharge: 2019-11-26 | Discharge status: Against Medical Advice | Payer: Self-pay

## 2019-11-26 NOTE — ED Notes (Signed)
Called pt in lobby 3 xs, called pt outside 1x. No answer.

## 2019-12-01 ENCOUNTER — Ambulatory Visit: Payer: Medicaid Other

## 2019-12-08 ENCOUNTER — Telehealth: Payer: Self-pay | Admitting: Student

## 2019-12-08 ENCOUNTER — Encounter (HOSPITAL_COMMUNITY): Payer: Self-pay

## 2019-12-08 ENCOUNTER — Encounter: Payer: Self-pay | Admitting: Skilled Nursing Facility1

## 2019-12-08 ENCOUNTER — Other Ambulatory Visit: Payer: Self-pay

## 2019-12-08 ENCOUNTER — Emergency Department (HOSPITAL_COMMUNITY)
Admission: EM | Admit: 2019-12-08 | Discharge: 2019-12-08 | Disposition: A | Discharge status: Home / Self Care | Payer: Medicaid Other | Attending: Emergency Medicine | Admitting: Emergency Medicine

## 2019-12-08 ENCOUNTER — Encounter: Payer: Self-pay | Attending: Family Medicine | Admitting: Skilled Nursing Facility1

## 2019-12-08 ENCOUNTER — Emergency Department (HOSPITAL_COMMUNITY): Payer: Medicaid Other

## 2019-12-08 DIAGNOSIS — Z5321 Procedure and treatment not carried out due to patient leaving prior to being seen by health care provider: Secondary | ICD-10-CM | POA: Insufficient documentation

## 2019-12-08 DIAGNOSIS — M25512 Pain in left shoulder: Secondary | ICD-10-CM | POA: Insufficient documentation

## 2019-12-08 DIAGNOSIS — E119 Type 2 diabetes mellitus without complications: Secondary | ICD-10-CM | POA: Insufficient documentation

## 2019-12-08 DIAGNOSIS — R079 Chest pain, unspecified: Secondary | ICD-10-CM | POA: Insufficient documentation

## 2019-12-08 LAB — BASIC METABOLIC PANEL
Anion gap: 10 (ref 5–15)
BUN: <5 mg/dL — ABNORMAL LOW (ref 6–20)
CO2: 22 mmol/L (ref 22–32)
Calcium: 9.4 mg/dL (ref 8.9–10.3)
Chloride: 110 mmol/L (ref 98–111)
Creatinine, Ser: 0.7 mg/dL (ref 0.44–1.00)
GFR calc Af Amer: >60 mL/min (ref >60)
GFR calc non Af Amer: >60 mL/min (ref >60)
Glucose, Bld: 120 mg/dL — ABNORMAL HIGH (ref 70–99)
Potassium: 3.6 mmol/L (ref 3.5–5.1)
Sodium: 142 mmol/L (ref 135–145)

## 2019-12-08 LAB — CBC
HCT: 36.7 % (ref 36.0–46.0)
Hemoglobin: 11.6 g/dL — ABNORMAL LOW (ref 12.0–15.0)
MCH: 28.4 pg (ref 26.0–34.0)
MCHC: 31.6 g/dL (ref 30.0–36.0)
MCV: 90 fL (ref 80.0–100.0)
Platelets: 449 10*3/uL — ABNORMAL HIGH (ref 150–400)
RBC: 4.08 MIL/uL (ref 3.87–5.11)
RDW: 14 % (ref 11.5–15.5)
WBC: 11.2 10*3/uL — ABNORMAL HIGH (ref 4.0–10.5)
nRBC: 0 % (ref 0.0–0.2)

## 2019-12-08 LAB — I-STAT BETA HCG BLOOD, ED (MC, WL, AP ONLY): I-stat hCG, quantitative: <5 m[IU]/mL (ref <5)

## 2019-12-08 LAB — TROPONIN I (HIGH SENSITIVITY): Troponin I (High Sensitivity): 4 ng/L (ref <18)

## 2019-12-08 NOTE — Telephone Encounter (Signed)
Spoke w/ pt, she will be NEW to clinic. She c/o multiple visits to ED and leaving because she waited too long. States she needs med refills and her problems addressed, appt per doriss. 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

## 2019-12-08 NOTE — Progress Notes (Signed)
Diabetes Self-Management Education  Visit Type: First/Initial  Appt. Start Time: 10:00 Appt. End Time: 11:00  12/08/2019  [name], identified by name and date of birth, is a 39 y.o. female with a diagnosis of Diabetes: Type 2.   ASSESSMENT  Weight 205 lb 3.2 oz (93.1 kg). Body mass index is 36.35 kg/m.  Due to pt experiencing chest pressure: Leonia Reader RN evaluated pt and determined best course of action to call emergency services to which pt agreed to. Pt was present for 1 hour of class but did not complete Core 1: Dietitian will call pt this coming Monday for pts plan on completing her diabetes education.  Pt did mark she is food insecure on her intake form.  A1C 11.1  Allergic to mushrooms.   Diabetes Self-Management Education - 12/08/19 1249      Visit Information   Visit Type First/Initial      Initial Visit   Diabetes Type Type 2    Are you currently following a meal plan? No    Are you taking your medications as prescribed? Yes      Health Coping   How would you rate your overall health? Good      Psychosocial Assessment   Patient Belief/Attitude about Diabetes Afraid    Self-management support Family    Other persons present Family Member      Pre-Education Assessment   Patient understands the diabetes disease and treatment process. Needs Instruction    Patient understands incorporating nutritional management into lifestyle. Needs Instruction    Patient undertands incorporating physical activity into lifestyle. Needs Instruction    Patient understands using medications safely. Needs Instruction    Patient understands monitoring blood glucose, interpreting and using results Needs Instruction    Patient understands prevention, detection, and treatment of acute complications. Needs Instruction    Patient understands prevention, detection, and treatment of chronic complications. Needs Instruction    Patient understands how to develop strategies to  address psychosocial issues. Needs Instruction    Patient understands how to develop strategies to promote health/change behavior. Needs Instruction      Complications   Last HgB A1C per patient/outside source 11.1 %    How often do you check your blood sugar? 3-4 times/day    Have you had a dilated eye exam in the past 12 months? No    Have you had a dental exam in the past 12 months? No    Are you checking your feet? Yes    How many days per week are you checking your feet? 5      Exercise   Exercise Type Light (walking / raking leaves)    How many days per week to you exercise? 6    How many minutes per day do you exercise? 30    Total minutes per week of exercise 180      Post-Education Assessment   Patient understands the diabetes disease and treatment process. Needs Review    Patient understands incorporating nutritional management into lifestyle. Needs Review    Patient undertands incorporating physical activity into lifestyle. Needs Review    Patient understands using medications safely. Needs Review    Patient understands monitoring blood glucose, interpreting and using results Needs Review    Patient understands prevention, detection, and treatment of acute complications. Needs Review    Patient understands prevention, detection, and treatment of chronic complications. Needs Review    Patient understands how to develop strategies to address psychosocial issues. Needs  Review    Patient understands how to develop strategies to promote health/change behavior. Needs Review      Outcomes   Expected Outcomes Demonstrated interest in learning. Expect positive outcomes    Program Status Not Completed          Expected Outcomes:  Demonstrated interest in learning. Expect positive outcomes  Education material provided: ADA - How to Thrive: A Guide for Your Journey with Diabetes, A1C conversion sheet, Meal plan card, My Plate, Snack sheet, Support group flyer and Carbohydrate  counting sheet  If problems or questions, patient to contact team via:  Phone  Future DSME appointment:     Patient was seen on 12/08/2019 for the first of a series of three diabetes self[facility]management courses [facility].  Patient Education Plan per assessed needs and concerns is to attend three course education program for Diabetes Self Management Education.  The following learning objectives were met by the patient during this class:  Describe diabetes, types of diabetes and pathophysiology  State some common risk factors for diabetes  Defines the role of glucose and insulin  Describe the relationship between diabetes and cardiovascular and other risks  State the members of the Healthcare Team  States the rationale for glucose monitoring and when to test  State their individual [place] the importance of logging glucose readings and how to interpret the readings  Identifies A1C target  Explain the correlation between A1c and eAG values  State symptoms and treatment of high blood glucose and low blood glucose  Explain proper technique for glucose testing and identify proper sharps disposal  Handouts given during class include:  How to Thrive:  A Guide for Your Journey with Diabetes by the ADA  Meal Plan Card and carbohydrate content list  Dietary intake form  Low Sodium Flavoring Tips  Types of Fats  Dining Out  Label reading  Snack list  Planning a balanced meal  The diabetes portion plate  Diabetes Resources  A1c to eAG Conversion Chart  Blood Glucose Log  Diabetes Recommended Care Schedule  Support Group  Diabetes Success Plan  Core Class Satisfaction Survey   Follow-Up Plan:  Attend core 2

## 2019-12-08 NOTE — ED Triage Notes (Signed)
Patient arrived by [facility] for CP that started early am. Describes as a pressure with radiation to left shoulder. Patient took asa with no relief. Alert and oriented, NAD. Smokes marijuana, no cigarettes

## 2019-12-08 NOTE — Telephone Encounter (Signed)
Pt requesting a phone call back.  Pt states she was having chest pain and left ED due to the 19 hour wait.  Pt requesting to be seen in the clinic.

## 2019-12-09 ENCOUNTER — Encounter: Payer: Self-pay | Admitting: Internal Medicine

## 2019-12-09 ENCOUNTER — Encounter (HOSPITAL_COMMUNITY): Payer: Self-pay | Admitting: Psychiatry

## 2019-12-09 ENCOUNTER — Ambulatory Visit: Payer: Medicaid Other | Admitting: Internal Medicine

## 2019-12-09 ENCOUNTER — Ambulatory Visit (INDEPENDENT_AMBULATORY_CARE_PROVIDER_SITE_OTHER): Payer: Self-pay | Admitting: Internal Medicine

## 2019-12-09 ENCOUNTER — Ambulatory Visit (INDEPENDENT_AMBULATORY_CARE_PROVIDER_SITE_OTHER): Payer: No Payment, Other | Admitting: Psychiatry

## 2019-12-09 VITALS — BP 114/79 | HR 82 | Temp 98.7°F | Ht 62.0 in | Wt 205.6 lb

## 2019-12-09 DIAGNOSIS — F331 Major depressive disorder, recurrent, moderate: Secondary | ICD-10-CM

## 2019-12-09 DIAGNOSIS — M940 Chondrocostal junction syndrome [Tietze]: Secondary | ICD-10-CM | POA: Insufficient documentation

## 2019-12-09 DIAGNOSIS — F411 Generalized anxiety disorder: Secondary | ICD-10-CM

## 2019-12-09 DIAGNOSIS — E119 Type 2 diabetes mellitus without complications: Secondary | ICD-10-CM

## 2019-12-09 DIAGNOSIS — I10 Essential (primary) hypertension: Secondary | ICD-10-CM

## 2019-12-09 MED ORDER — BUSPIRONE HCL 10 MG PO TABS: 10.0000 mg | ORAL_TABLET | Freq: Three times a day (TID) | ORAL | 2 refills | Status: DC

## 2019-12-09 MED ORDER — DICLOFENAC SODIUM 1 % EX GEL: 2.0000 g | Freq: Four times a day (QID) | CUTANEOUS | 1 refills | Status: DC

## 2019-12-09 MED ORDER — METFORMIN HCL 1000 MG PO TABS: 1000.0000 mg | ORAL_TABLET | Freq: Two times a day (BID) | ORAL | 3 refills | Status: DC

## 2019-12-09 MED ORDER — HYDROXYZINE HCL 10 MG PO TABS: 10.0000 mg | ORAL_TABLET | Freq: Three times a day (TID) | ORAL | 2 refills | Status: DC | PRN

## 2019-12-09 MED ORDER — MIRTAZAPINE 15 MG PO TABS: 15.0000 mg | ORAL_TABLET | Freq: Every day | ORAL | 2 refills | Status: DC

## 2019-12-09 NOTE — Patient Instructions (Signed)
Thank you for allowing [place] to provide your care today. Today we discussed your chest pain. Our evaluation today, was reassuring and did not show any evidence of heart attack.  Your chest pain is likely musculoskeletal. You can continue taking ibuprofen as needed.  I prescribed you Voltaren gel to apply on painful area up to 4 times a day. Please come back to clinic if your symptoms got worse. Always, if you develop severe chest pain, shortness of breath, heart racing, dizziness, lightheadedness, please make sure to seek medical attention at the emergency room.   I sent a refill for your Metformin. Take 1 tablet (total of 1000 mg) twice a day.  Make sure to follow-up with [place] in 1-2 month to establish care.  Please take rest of your medications as before.  Should you have any questions or concerns please call the internal medicine clinic at [phone].    Thank you!

## 2019-12-09 NOTE — Assessment & Plan Note (Signed)
Patient presented with 5 days history of intermittent pleuritic chest pain. The pain is worse at night and when she lays down or move.  Sitting up or walking makes the pain better.  Rubbing her chest also alleviate the pain.  Her pain is worse when she takes deep breath, however she denies any severe shortness of breath.  She did not have any wheezing and using her inhaler did not change the paretic chest pain. She was seen in ED yesterday because of the pain.  EKG and chest x-ray were unremarkable.  However, she did not stay for further work-up given long waiting time in ED.  She took some ibuprofen with mild relief.  On exam, she has reproducible anterior chest wall pain.  Cardiopulmonary exam is unremarkable. Her mentation is suggestive of costochondritis.  Provided reassurance and will manage conservatively with NSAIDs.  -Voltaren gel 2% up to 4 times a day as needed -Can continue taking ibuprofen as needed -Strict ER precautions reviewed with patient -Follow-up in clinic as needed

## 2019-12-09 NOTE — Progress Notes (Signed)
Psychiatric Initial Adult Assessment   Patient Identification: Dana Hughes MRN:  [id number] Date of Evaluation:  12/09/2019 Referral Source: [facility]/Walk in  Chief Complaint:  "I deal with a lot of anger issues, stress, and anxiety". Visit Diagnosis:    ICD-10-CM   1. Moderate episode of recurrent major depressive disorder (HCC)  F33.1 mirtazapine (REMERON) 15 MG tablet    Ambulatory referral to Social Work  2. Generalized anxiety disorder  F41.1 mirtazapine (REMERON) 15 MG tablet    hydrOXYzine (ATARAX/VISTARIL) 10 MG tablet    busPIRone (BUSPAR) 10 MG tablet    Ambulatory referral to Social Work    History of Present Illness: 39 year old female seen today for initial psychiatric evaluation.  Patient walked in to outpatient clinic for psychiatric evaluation and medication management.  She is a former patient of [facility].  She has a psychiatric history of depression, anxiety, and adjustment disorder.  Patient is currently not taking any medications.  She notes that she was prescribed medications in the past however cannot remember the name of the medications.  She endorses depressive symptoms such as insomnia, fatigue, feelings of worthlessness, difficulty concentrating, decreased libido, decreased appetite, and decreased weight (patient notes that she lost 30 pounds within the last month).  Patient notes that she has several life stressors including multiple deaths, relationship issues, and multiple medical comorbidities.  She reports that her father, and some family members all died within the last 2 years.  She also notes that she and her boyfriend are having relationship issues and notes that they fight often.  She informed Probation officer that she lost custody of her 3 children in 2006 due to false claims made by her children's father and his family members.  She notes that her daughter was adopted and reports that her 2 sons were placed in the custody of her aunt.  She notes that she has a strong  relationship with her son however notes that she is trying to gain a relationship with her 52 year old daughter who she recently came in contact with via social media.  Also informed provider that in 2011 she was placed on a 3-year probation because her mother was caught shoplifting and she notes that she was in the store when this occurred.  Patient notes that the above stresses caused her to be irritable and she reports that she thoughts about hurting others.  Writer asked patient if she would act on her thoughts.  She notes that she think about consequences of acting on thoughts and reports that he does not want to hurt anybody.  She denies SI/HI/VH.  At times she notes that she feels she hears someone calling her name however notes that when she asked others if they are talking to her they report that they are not.  She says that this has been going on for the last 3 weeks.  Patient informed Probation officer that she was recently diagnosed with endometriosis, ovarian cyst, diabetes, and hypertension.  She notes that yesterday she went into the emergency department because she felt like she was having heart attack.  She notes that she frequently has episodes where she falls asleep.  She notes that she left the emergency department yesterday because she had waited for several hours without being seen.  Patient notes that she does not have insurance and quit her job at [employer] 3 weeks ago because she was having difficulty seeing because of increased blood sugars.  Writer gave patient resources for [employer] and wellness.  She  endorsed understanding and was grateful.  Patient is agreeable to starting BuSpar 10 mg 3 times daily and hydroxyzine 10 mg 3 times daily to help manage anxiety.  She is also agreeable to starting mirtazapine 15 mg at bedtime to help improve sleep and depressive symptoms. Potential side effects of medication and risks vs benefits of treatment vs non-treatment were explained and  discussed. All questions were answered. She will follow-up with outpatient counselor for therapy.  No other concerns noted at this time.  Associated Signs/Symptoms: Depression Symptoms:  depressed mood, insomnia, fatigue, feelings of worthlessness/guilt, difficulty concentrating, impaired memory, anxiety, loss of energy/fatigue, disturbed sleep, weight loss, decreased labido, decreased appetite, (Hypo) Manic Symptoms:  Distractibility, Elevated Mood, Flight of Ideas, Hallucinations, Anxiety Symptoms:  Excessive Worry, Psychotic Symptoms:  Hallucinations: Auditory PTSD Symptoms: NA  Past Psychiatric History: Anxiety, depression, and adjustment disorder  Previous Psychotropic Medications: No   Substance Abuse History in the last 12 months:  Yes.    Consequences of Substance Abuse: NA  Past Medical History:  Past Medical History:  Diagnosis Date  . Anxiety   . Arthritis   . Asthma   . BV (bacterial vaginosis)   . Candidiasis   . Depression   . Headache(784.0)    migraines  . Hypertension    no longer takes meds  . Migraines   . Muscle spasm   . Ovarian cyst   . Pelvic pain in female 09/29/2015  . Scoliosis     Past Surgical History:  Procedure Laterality Date  . CHOLECYSTECTOMY    . TUBAL LIGATION      Family Psychiatric History: Alcohol use disorder paternal and maternal family members, siblings anxiety,   Family History:  Family History  Problem Relation Age of Onset  . Cancer Father   . Hypertension Maternal Aunt   . Breast cancer Maternal Aunt   . Cancer Paternal Grandmother   . Breast cancer Paternal Grandmother   . Breast cancer Paternal Aunt     Social History:   Social History   Socioeconomic History  . Marital status: Single    Spouse name: Not on file  . Number of children: Not on file  . Years of education: Not on file  . Highest education level: Not on file  Occupational History  . Not on file  Tobacco Use  . Smoking status:  Never Smoker  . Smokeless tobacco: Never Used  Substance and Sexual Activity  . Alcohol use: Not Currently    Comment: rare  . Drug use: Yes    Frequency: 1.0 times per week    Types: Marijuana  . Sexual activity: Yes    Birth control/protection: Condom, Surgical  Other Topics Concern  . Not on file  Social History Narrative  . Not on file   Social Determinants of Health   Financial Resource Strain:   . Difficulty of Paying Living Expenses:   Food Insecurity: Food Insecurity Present  . Worried About Charity fundraiser in the Last Year: Sometimes true  . Ran Out of Food in the Last Year: Sometimes true  Transportation Needs: No Transportation Needs  . Lack of Transportation (Medical): No  . Lack of Transportation (Non-Medical): No  Physical Activity:   . Days of Exercise per Week:   . Minutes of Exercise per Session:   Stress:   . Feeling of Stress :   Social Connections:   . Frequency of Communication with Friends and Family:   . Frequency of Social Gatherings with  Friends and Family:   . Attends Religious Services:   . Active Member of Clubs or Organizations:   . Attends Archivist Meetings:   Marland Kitchen Marital Status:     Additional Social History: Patient resides in [place] with her boyfriend.  She has 3 children 2 sons and 1 daughter (who was adopted).  She notes that she has been unemployed for the last 3 weeks.  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  She denies tobacco use.  She endorses occasional alcohol drinks.  She also endorses occasional marijuana use.  Allergies:   Allergies  Allergen Reactions  . Mushroom Extract Complex Anaphylaxis  . Latex Itching and Swelling  . Penicillins Itching    Has patient had a PCN reaction causing immediate rash, facial/tongue/throat swelling, SOB or lightheadedness with hypotension: No Has patient had a PCN reaction causing severe  rash involving mucus membranes or skin necrosis: No Has patient had a PCN reaction that required hospitalization No Has patient had a PCN reaction occurring within the last 10 years: No If all of the above answers are "NO", then may proceed with Cephalosporin use.     Metabolic Disorder Labs: Lab Results  Component Value Date   HGBA1C 11.1 (H) 11/12/2019   MPG 271.87 11/12/2019   No results found for: PROLACTIN No results found for: CHOL, TRIG, HDL, CHOLHDL, VLDL, LDLCALC Lab Results  Component Value Date   TSH 1.279 05/29/2017    Therapeutic Level Labs: No results found for: LITHIUM No results found for: CBMZ No results found for: VALPROATE  Current Medications: Current Outpatient Medications  Medication Sig Dispense Refill  . albuterol (PROVENTIL HFA;VENTOLIN HFA) 108 (90 Base) MCG/ACT inhaler Inhale 2 puffs into the lungs every 6 (six) hours as needed for wheezing or shortness of breath. Reported on 09/29/2015    . albuterol (PROVENTIL) (2.5 MG/3ML) 0.083% nebulizer solution Take 3 mLs (2.5 mg total) by nebulization every 6 (six) hours as needed for wheezing or shortness of breath. (Patient not taking: Reported on 11/23/2019) 75 mL 0  . amLODipine (NORVASC) 5 MG tablet Take 1 tablet (5 mg total) by mouth daily. 90 tablet 1  . artificial tears (LACRILUBE) OINT ophthalmic ointment Place into both eyes at bedtime as needed for dry eyes. 3.5 g 0  . busPIRone (BUSPAR) 10 MG tablet Take 1 tablet (10 mg total) by mouth 3 (three) times daily. 90 tablet 2  . glucose blood test strip Check your sugar in the morning before you eat breakfast, and one hour after a meal. 100 each 2  . glucose monitoring kit (FREESTYLE) monitoring kit 1 each by Does not apply route daily. Check glucose once in the morning before breakfast and 1 hour after a meal 1 each 0  . hydrOXYzine (ATARAX/VISTARIL) 10 MG tablet Take 1 tablet (10 mg total) by mouth 3 (three) times daily as needed. 90 tablet 2  . ibuprofen  (ADVIL) 600 MG tablet Take 1 tablet (600 mg total) by mouth every 6 (six) hours as needed. 30 tablet 0  . meclizine (ANTIVERT) 25 MG tablet Take 1 tablet (25 mg total) by mouth 3 (three) times daily as needed for dizziness. (Patient not taking: Reported on 07/21/2018) 30 tablet 0  . medroxyPROGESTERone (PROVERA) 10 MG tablet Take 2 tablets (20 mg total) by mouth daily. 60 tablet 2  . metFORMIN (GLUCOPHAGE) 500 MG tablet Take 1 tablet (500 mg  total) by mouth daily with breakfast. 1 tab po in the a.m. for 1 week, 1 tab twice a day for 1 week, then 1 tab in am and 2 tabs at night for the next week 42 tablet 0  . methocarbamol (ROBAXIN) 500 MG tablet Take 1 tablet (500 mg total) by mouth 2 (two) times daily as needed for muscle spasms. (Patient taking differently: Take 500 mg by mouth 2 (two) times daily as needed for muscle spasms. PT states she is unable to get filled) 20 tablet 0  . mirtazapine (REMERON) 15 MG tablet Take 1 tablet (15 mg total) by mouth at bedtime. 30 tablet 2  . PE-DM-APAP & Doxylamin-DM-APAP (VICKS DAYQUIL/NYQUIL CLD & FLU) (Liquid) MISC Take 30 mLs by mouth 2 (two) times daily as needed (cough).    . SUMAtriptan (IMITREX) 100 MG tablet Take 1 tablet (100 mg total) by mouth every 2 (two) hours as needed for migraine. May repeat in 2 hours if headache persists or recurs. No more than 2 in a 24 hour period (Patient not taking: Reported on 11/23/2019) 10 tablet 0   Current Facility-Administered Medications  Medication Dose Route Frequency Provider Last Rate Last Admin  . medroxyPROGESTERone (DEPO-PROVERA) injection 150 mg  150 mg Intramuscular Q90 days Chancy Milroy, MD   150 mg at 10/29/19 1032    Musculoskeletal: Strength & Muscle Tone: within normal limits Gait & Station: normal Patient leans: N/A  Psychiatric Specialty Exam: Review of Systems  There were no vitals taken for this visit.There is no height or weight on file to calculate BMI.  General Appearance: Well Groomed   Eye Contact:  Good  Speech:  Clear and Coherent and Normal Rate  Volume:  Normal  Mood:  Anxious and Depressed  Affect:  Appropriate and Congruent  Thought Process:  Coherent, Goal Directed and Linear  Orientation:  Full (Time, Place, and Person)  Thought Content:  Logical and Hallucinations: Auditory  Suicidal Thoughts:  No  Homicidal Thoughts:  No  Memory:  Immediate;   Good Recent;   Good Remote;   Good  Judgement:  Good  Insight:  Fair  Psychomotor Activity:  Normal  Concentration:  Concentration: Good and Attention Span: Good  Recall:  Good  Fund of Knowledge:Good  Language: Good  Akathisia:  No  Handed:  Right  AIMS (if indicated):  Not done  Assets:  Communication Skills Desire for Improvement Financial Resources/Insurance Housing Social Support  ADL's:  Intact  Cognition: WNL  Sleep:  Poor   Screenings: GAD-7     Office Visit from 11/23/2019 in [place] for [employer] at [facility] Clinical Support from 10/29/2019 in [place] for [employer] at [facility] Clinical Support from 08/13/2019 in [place] for [facility] Office Visit from 06/29/2019 in [place] for [facility] Office Visit from 08/30/2017 in [place] for [facility]  Total GAD-7 Score _0 PHQ2-9     Nutrition from 12/08/2019 in [facility] Office Visit from 11/23/2019 in [place] for [employer] at [facility] for [place] from 10/29/2019 in [facility] for [employer] at [facility] for [facility] Clinical Support from 08/13/2019 in [place] for [facility] Office Visit from 06/29/2019 in [place] for [facility]  PHQ-2 Total Score 0 _1 PHQ-9 Total Score -- _2 Assessment and Plan:  Patient endorses symptoms of depression, anxiety, insomnia, and irritability.  She is agreeable to starting  BuSpar 10 mg 3 times daily and hydroxyzine 10 mg 3 times daily to help manage symptoms of anxiety.  She is also agreeable to starting Remeron 15 mg at bedtime to help symptoms of depression and poor sleep.  1. Moderate episode of recurrent major depressive disorder (HCC)  Start- mirtazapine (REMERON) 15 MG tablet; Take 1 tablet (15 mg total) by mouth at bedtime.  Dispense: 30 tablet; Refill: 2 - Ambulatory referral to Social Work  2. Generalized anxiety disorder  Start- mirtazapine (REMERON) 15 MG tablet; Take 1 tablet (15 mg total) by mouth at bedtime.  Dispense: 30 tablet; Refill: 2 Start- hydrOXYzine (ATARAX/VISTARIL) 10 MG tablet; Take 1 tablet (10 mg total) by mouth 3 (three) times daily as needed.  Dispense: 90 tablet; Refill: 2 Start- busPIRone (BUSPAR) 10 MG tablet; Take 1 tablet (10 mg total) by mouth 3 (three) times daily.  Dispense: 90 tablet; Refill: 2 - Ambulatory referral to Social Work  Follow-up in 3 months Follow-up with therapy    Salley Slaughter, NP 8/18/20213:08 PM

## 2019-12-09 NOTE — Assessment & Plan Note (Signed)
Newly diagnosed diabetes type 2 on July 2021. A1c was 11.1.  She was started on Metformin 500 mg daily to titrate up.  Ever, she ran out of her medication and not having any PCP, she did not have any refill.  -Metformin 1000 mg twice daily -Repeat A1c in 2 months -Discussed with patient that we will likely need to add other antiglycemic agents to her regimen next visit to control her diabetes -Follow-up in clinic to establish care and to see PCP

## 2019-12-09 NOTE — Progress Notes (Signed)
   CC: Medication refill, Chest pain  HPI:  [name] is a 39 y.o. female with PMHx as documented below, presented with chest pain and also asking for medication refill. Please refer to problem based charting for further details and assessment and plan of current problem and chronic medical conditions.   Past Medical History:  Diagnosis Date  . Anxiety   . Arthritis   . Asthma   . BV (bacterial vaginosis)   . Candidiasis   . Depression   . Headache(784.0)    migraines  . Hypertension    no longer takes meds  . Migraines   . Muscle spasm   . Ovarian cyst   . Pelvic pain in female 09/29/2015  . Scoliosis    Review of Systems:  Constitutional: Negative for chills and fever.  Respiratory: Negative for shortness of breath.   Cardiovascular: Positive for pleuretic chest pain. Negative for leg swelling.  Gastrointestinal: Negative for abdominal pain, nausea and vomiting.   Physical Exam:  Vitals:   12/09/19 1555  BP: 114/79  Pulse: 82  Temp: 98.7 F (37.1 C)  TempSrc: Oral  SpO2: 100%  Weight: 205 lb 9.6 oz (93.3 kg)  Height: 5\' 2"  (1.575 m)   Constitutional: Well-developed and well-nourished. No acute distress.  HENT:  Head: Normocephalic and atraumatic.  Eyes:Conjunctivae are normal, EOM nl Cardiovascular: RRR, nl S1S2, no murmur,  no LEE Respiratory: Effort normal and breath sounds normal. No respiratory distress. No wheezes.  GI: Soft. Bowel sounds are normal. No distension. There is no tenderness.  MSK: Tenderness on palpation on anterior chest Neurological: Is alert and oriented x 3  Skin: Not diaphoretic. No erythema.  Psychiatric: Normal mood and affect. Behavior is normal. Judgment and thought content normal.   Assessment & Plan:   See Encounters Tab for problem based charting.  Patient discussed with Dr. Evette Doffing

## 2019-12-09 NOTE — Assessment & Plan Note (Signed)
Blood pressure today, is 114/79.  Well controlled on amlodipine 5 mg daily.  -Continue amlodipine 5 mg daily -Follow-up in clinic in 1-2 months to establish care

## 2019-12-10 ENCOUNTER — Ambulatory Visit: Payer: Self-pay | Attending: Physician Assistant | Admitting: Physician Assistant

## 2019-12-10 MED FILL — ?BUSPIRONE HCL 10 MG TABLET: 10 | 30 days supply | Qty: 90 | Fill #0

## 2019-12-10 MED FILL — hydrOXYzine HCL 10 MG TABS: 10 | 30 days supply | Qty: 90 | Fill #0

## 2019-12-10 MED FILL — ?MIRTAZAPINE 15 MG TABLET: 15 | 30 days supply | Qty: 30 | Fill #0

## 2019-12-10 NOTE — Progress Notes (Signed)
Internal Medicine Clinic Attending  Case discussed with Dr. Masoudi  At the time of the visit.  We reviewed the resident's history and exam and pertinent patient test results.  I agree with the assessment, diagnosis, and plan of care documented in the resident's note.  

## 2019-12-14 ENCOUNTER — Telehealth: Payer: Self-pay | Admitting: Skilled Nursing Facility1

## 2019-12-14 NOTE — Telephone Encounter (Signed)
Called to check in on pts status after being taken out of education class by EMS due to chest pain.   Pt states she is doing fine and will be in class tomorrow.

## 2019-12-15 ENCOUNTER — Encounter: Payer: Self-pay | Admitting: Dietician

## 2019-12-15 ENCOUNTER — Other Ambulatory Visit: Payer: Self-pay

## 2019-12-15 DIAGNOSIS — E119 Type 2 diabetes mellitus without complications: Secondary | ICD-10-CM

## 2019-12-15 NOTE — Progress Notes (Signed)
Patient was seen on 12/15/2019 for the second of a series of three diabetes self-management courses at the [facility]. The following learning objectives were met by the patient during this class:   Describe the role of different macronutrients on glucose  Explain how carbohydrates affect blood glucose  State what foods contain the most carbohydrates  Demonstrate carbohydrate counting  Demonstrate how to read Nutrition Facts food label  Describe effects of various fats on heart health  Describe the importance of good nutrition for health and healthy eating strategies  Describe techniques for managing your shopping, cooking and meal planning  List strategies to follow meal plan when dining out  Describe the effects of alcohol on glucose and how to use it safely  Goals:  Follow Diabetes Meal Plan as instructed  Aim to spread carbs evenly throughout the day  Aim for 3 meals per day and snacks as needed Include lean protein foods to meals/snacks  Monitor glucose levels as instructed by your doctor   Follow-Up Plan:  Attend Core 3  Work towards following your personal food plan.

## 2019-12-23 ENCOUNTER — Encounter: Payer: Medicaid Other | Admitting: Student

## 2019-12-31 ENCOUNTER — Ambulatory Visit: Payer: Medicaid Other | Attending: Physician Assistant | Admitting: Physician Assistant

## 2020-01-14 ENCOUNTER — Encounter: Payer: Self-pay | Admitting: Lactation Services

## 2020-01-14 ENCOUNTER — Other Ambulatory Visit: Payer: Self-pay

## 2020-01-14 ENCOUNTER — Ambulatory Visit (INDEPENDENT_AMBULATORY_CARE_PROVIDER_SITE_OTHER): Payer: Medicaid Other | Admitting: Lactation Services

## 2020-01-14 ENCOUNTER — Encounter: Payer: Self-pay | Admitting: Obstetrics & Gynecology

## 2020-01-14 DIAGNOSIS — I10 Essential (primary) hypertension: Secondary | ICD-10-CM

## 2020-01-14 DIAGNOSIS — Z3042 Encounter for surveillance of injectable contraceptive: Secondary | ICD-10-CM | POA: Diagnosis not present

## 2020-01-14 MED ORDER — MEDROXYPROGESTERONE ACETATE 150 MG/ML IM SUSP
150.0000 mg | Freq: Once | INTRAMUSCULAR | Status: AC
  Administered 2020-01-14: 150 mg via INTRAMUSCULAR

## 2020-01-14 MED ORDER — AMLODIPINE BESYLATE 5 MG PO TABS: 5.0000 mg | ORAL_TABLET | Freq: Every day | ORAL | 1 refills | Status: DC

## 2020-01-14 NOTE — Progress Notes (Signed)
Agree with A & P. 

## 2020-01-14 NOTE — Progress Notes (Signed)
Patient has PHQ9 of 20 and GAD-& of 18. She reports she is being seen at [facility] and recently started on  medication and is to be seen again on October 13. She reports she is agitated often but is working on coping mechanisms with provider. Discussed with Dr. Roselie Awkward. Offered for patient to see [facility] while in the office. Patient agreeable. Roselyn Reef not available today, she has an appointment available on 9.28. She has a lot of family stress and reports she has no plans for self harm and no plans to harm self. Patient is aware [facility] is available 24 hours a day if needed.   Patient is asking about getting Metformin and Norvasc refilled. Metformin refilled on 8/18. Norvasc refilled today.   [name] here for Depo-Provera  Injection.  Injection administered without complication. Patient will return in 3 months for next injection. Patient tolerated well.   Patient reports she is having pain in her abdomen and would like to have another [place] to see what is causing the pain, routed to Dr. Rip Harbour for advisement.   Donn Pierini, RN 01/14/2020  10:04 AM

## 2020-01-18 ENCOUNTER — Ambulatory Visit (INDEPENDENT_AMBULATORY_CARE_PROVIDER_SITE_OTHER): Payer: Self-pay | Admitting: Clinical

## 2020-01-18 DIAGNOSIS — Z658 Other specified problems related to psychosocial circumstances: Secondary | ICD-10-CM

## 2020-01-18 NOTE — BH Specialist Note (Signed)
Integrated Behavioral Health via Telemedicine Phone [facility]) Visit  01/18/2020 Dana Hughes [id number]  Number of [place] visits: 2 Session Start time: 10:00  Session End time: 10:10 Total time: 10 minutes  Referring Provider: Darron Doom, MD Type of Service: Individual Patient/Family location: [facility] Provider location: Center for [place] at [facility]  All persons participating in visit: Patient Dana Hughes and Dana Hughes     I connected with Dana Hughes  by a video enabled telemedicine application ([place]) and verified that I am speaking with the correct person using two identifiers.   Discussed confidentiality: at previous visit  Confirmed demographics & insurance:  Yes : pt uncertain Medicaid new plan   I discussed that engaging in this virtual visit, they consent to the provision of behavioral healthcare and the services will be billed under their insurance.   Patient and/or legal guardian expressed understanding and consented to virtual visit: Yes   PRESENTING CONCERNS: Patient and/or family reports the following symptoms/concerns: Pt states her primary concern today is transportation/paying for auto repairs and financial stress Duration of problem: Increase after purchasing first car (with mechanical issues); Severity of problem: moderate  STRENGTHS (Protective Factors/Coping Skills): Concrete supports in place (healthy food, safe environments, etc.)  ASSESSMENT: Patient currently experiencing Psychosocial stress.    GOALS ADDRESSED: Patient will: 1.  Reduce symptoms of: stress  2.  Increase knowledge and/or ability of: stress reduction  3.  Demonstrate ability to: Increase healthy adjustment to current life circumstances   Progress of Goals: Ongoing  INTERVENTIONS: Interventions utilized:  Link to [employer] Standardized Assessments completed & reviewed: Not  Needed   OUTCOME: Patient Response: Pt agrees to treatment plan   PLAN: 1. Follow up with behavioral health clinician on : As needed 2. Behavioral recommendations:  -Accept referral to [employer] for potential help with auto repair needs -Continue using reduced-fare ID cards for [place] bus rides -Continue with plan to attend appointment with [facility] on 02/09/20 3. Referral(s): [place] (In Clinic)  I discussed the assessment and treatment plan with the patient and/or parent/guardian. They were provided an opportunity to ask questions and all were answered. They agreed with the plan and demonstrated an understanding of the instructions.   They were advised to call back or seek an in-person evaluation as appropriate.  I discussed that the purpose of this visit is to provide behavioral health care while limiting exposure to the novel coronavirus.  Discussed there is a possibility of technology failure and discussed alternative modes of communication if that failure occurs.  Dana Hughes Dana Hughes

## 2020-01-18 NOTE — Patient Instructions (Signed)
Center for [facility] at [facility] [place], [place] [address] [phone] (main office) [phone] ([place] office)  [email or web address]  [phone]

## 2020-01-19 MED FILL — ?BUSPIRONE HCL 10 MG TABLET: 10 | 30 days supply | Qty: 90 | Fill #1

## 2020-01-19 MED FILL — MIRTAZAPINE 15 MG TABLET: 15 | 30 days supply | Qty: 30 | Fill #1

## 2020-01-19 MED FILL — hydrOXYzine HCL 10 MG TABS: 10 | 30 days supply | Qty: 90 | Fill #1

## 2020-01-20 MED FILL — hydrOXYzine HCL 10 MG TABS: 10 | 30 days supply | Qty: 90 | Fill #2

## 2020-01-24 ENCOUNTER — Other Ambulatory Visit: Payer: Self-pay

## 2020-01-24 ENCOUNTER — Ambulatory Visit (HOSPITAL_COMMUNITY)
Admission: EM | Admit: 2020-01-24 | Discharge: 2020-01-24 | Disposition: A | Discharge status: Home / Self Care | Payer: Medicaid Other | Attending: Family Medicine | Admitting: Family Medicine

## 2020-01-24 ENCOUNTER — Encounter (HOSPITAL_COMMUNITY): Payer: Self-pay | Admitting: Emergency Medicine

## 2020-01-24 DIAGNOSIS — R739 Hyperglycemia, unspecified: Secondary | ICD-10-CM

## 2020-01-24 LAB — CBG MONITORING, ED: Glucose-Capillary: 271 mg/dL — ABNORMAL HIGH (ref 70–99)

## 2020-01-24 NOTE — ED Triage Notes (Signed)
Pt c/o of hyperglycemia. States yesterday it was 477. Pt has not taken metformin in over a month. C/o fatigue. BS at time 271

## 2020-02-04 ENCOUNTER — Ambulatory Visit (HOSPITAL_COMMUNITY): Payer: Self-pay | Admitting: Psychiatry

## 2020-02-09 ENCOUNTER — Ambulatory Visit (HOSPITAL_COMMUNITY): Payer: No Payment, Other | Admitting: Clinical

## 2020-02-18 ENCOUNTER — Telehealth: Payer: Self-pay | Admitting: *Deleted

## 2020-02-18 NOTE — Telephone Encounter (Signed)
Call placed to patient. Explained these sx are concerning. Advise scheduling appt tomorrow and not wait till November. Scheduled with [facility] Team tomorrow at [phone]. She is asked to bring glucometer and meds with her. Patient just started new job and needs to be there by 11 tomorrow AM L. Rillie Riffel, BSN, RN-BC

## 2020-02-18 NOTE — Telephone Encounter (Signed)
[name] L  Imp Triage Nurse Pool 55 minutes ago (2:08 PM)  [place] This message is for you.   Thanks,   [employer] text   [name]  Patient Appointment Schedule Request Pool 17 hours ago (9:45 PM)   Appointment Request From: Dana Hughes  With Provider: Dewayne Hatch, MD [facility]]  Preferred Date Range: 02/26/2020 - 03/07/2020  Preferred Times: Any Time  Reason for visit: Office Visit  Comments: Pain in uterus diabetes blood sugar keep going up it went up to 507 then I recheck it and it read Hi im having blood clots I've been bleeding since the day after my depo shot was giving to me

## 2020-02-19 ENCOUNTER — Ambulatory Visit (INDEPENDENT_AMBULATORY_CARE_PROVIDER_SITE_OTHER): Payer: Self-pay | Admitting: Internal Medicine

## 2020-02-19 ENCOUNTER — Encounter: Payer: Self-pay | Admitting: Internal Medicine

## 2020-02-19 ENCOUNTER — Encounter: Payer: Self-pay | Admitting: Dietician

## 2020-02-19 ENCOUNTER — Ambulatory Visit: Payer: Medicaid Other | Admitting: Dietician

## 2020-02-19 ENCOUNTER — Other Ambulatory Visit (HOSPITAL_COMMUNITY): Payer: Self-pay | Admitting: Internal Medicine

## 2020-02-19 VITALS — BP 133/89 | HR 87 | Wt 206.6 lb

## 2020-02-19 DIAGNOSIS — E119 Type 2 diabetes mellitus without complications: Secondary | ICD-10-CM

## 2020-02-19 DIAGNOSIS — Z7984 Long term (current) use of oral hypoglycemic drugs: Secondary | ICD-10-CM

## 2020-02-19 DIAGNOSIS — I10 Essential (primary) hypertension: Secondary | ICD-10-CM

## 2020-02-19 LAB — GLUCOSE, CAPILLARY: Glucose-Capillary: 393 mg/dL — ABNORMAL HIGH (ref 70–99)

## 2020-02-19 LAB — POCT URINALYSIS DIPSTICK
Bilirubin, UA: NEGATIVE
Glucose, UA: POSITIVE — AB
Ketones, UA: NEGATIVE
Leukocytes, UA: NEGATIVE
Nitrite, UA: NEGATIVE
Protein, UA: NEGATIVE
Spec Grav, UA: 1.015 (ref 1.010–1.025)
Urobilinogen, UA: 0.2 E.U./dL
pH, UA: 6.5 (ref 5.0–8.0)

## 2020-02-19 LAB — POCT GLYCOSYLATED HEMOGLOBIN (HGB A1C): Hemoglobin A1C: 11.3 % — AB (ref 4.0–5.6)

## 2020-02-19 MED ORDER — AMLODIPINE BESYLATE 5 MG PO TABS: 5.0000 mg | ORAL_TABLET | Freq: Every day | ORAL | 1 refills | Status: DC

## 2020-02-19 MED ORDER — LANTUS SOLOSTAR 100 UNIT/ML ~~LOC~~ SOPN
20.0000 [IU] | PEN_INJECTOR | Freq: Every day | SUBCUTANEOUS | 1 refills | Status: DC
Start: 2020-02-19 — End: 2020-02-23

## 2020-02-19 MED ORDER — INSULIN ASPART 100 UNIT/ML FLEXPEN
10.0000 [IU] | PEN_INJECTOR | Freq: Two times a day (BID) | SUBCUTANEOUS | 2 refills | Status: DC
Start: 2020-02-19 — End: 2020-02-23

## 2020-02-19 MED ORDER — INSULIN ASPART 100 UNIT/ML FLEXPEN
10.0000 [IU] | PEN_INJECTOR | Freq: Two times a day (BID) | SUBCUTANEOUS | 2 refills | Status: DC
Start: 2020-02-19 — End: 2020-02-19

## 2020-02-19 MED ORDER — UNIFINE PEN NEEDLES 32G X 4 MM MISC: 1.0000 "application " | Freq: Three times a day (TID) | 1 refills | Status: DC

## 2020-02-19 MED FILL — UNIFINE PENTIPS 32GX5/32: 32G X 4 MM | 30 days supply | Qty: 100 | Fill #0

## 2020-02-19 MED FILL — LANTUS SOLOSTAR 100 UNITS/M: 100 | 15 days supply | Qty: 3 | Fill #0

## 2020-02-19 MED FILL — NOVOLOG FLEXPEN SYRINGE: 100 | 15 days supply | Qty: 3 | Fill #0

## 2020-02-19 MED FILL — AMLODIPINE BESYLATE 5 MG TA: 5 | 30 days supply | Qty: 30 | Fill #0

## 2020-02-19 NOTE — Assessment & Plan Note (Addendum)
Patient presents for progressive symptomatic hyperglycemia over the last 2 weeks. Primarily experiencing fatigue, light headedness, blurred vision, polyuria, polydipsia, and nausea. She has been compliant with Metformin 1000 mg BID. Despite this, A1C has remained unchanged at 11.3 compared to 3 months ago.  POC CBG today is 393 and POC U/A dipstick negative for ketones. To avoid risk of developing a hyperglycemic crisis in the future, will go ahead and initiate long-acting and meal time insulin which she is amenable to. Wight based insulin calculation is approximately 40 units TDD. She mentions she only eats two full meals per day so will do Lantus 20 units qhs and Novolog 10 BID with meals. Return in 2 weeks for glucometer reading and insulin adjustment as needed.

## 2020-02-19 NOTE — Patient Instructions (Signed)
Dana Hughes, It was nice seeing you.   Today we discussed your elevated blood sugars. Since you have not shown any improvement in your A1C on metformin and are having significant symptoms, we should start insulin.   You have been instructed on how to do so with Butch Penny.   I have sent in these medications that are on our $4 list at [facility].   Let's bring you back in 2 weeks or so to see how things are going.   Take care, Dr. Koleen Distance

## 2020-02-19 NOTE — Progress Notes (Signed)
Acute Office Visit  Subjective:    Patient ID: Dana Hughes, female    DOB: 19-Dec-1980, 39 y.o.   MRN: [id number]  Chief Complaint  Patient presents with  . Diabetes    HPI Patient is in today for elevated blood glucose levels. Please see problem based charting for further details.   Past Medical History:  Diagnosis Date  . Anxiety   . Arthritis   . Asthma   . BV (bacterial vaginosis)   . Candidiasis   . Depression   . Headache(784.0)    migraines  . Hypertension    no longer takes meds  . Migraines   . Muscle spasm   . Ovarian cyst   . Pelvic pain in female 09/29/2015  . Scoliosis     Past Surgical History:  Procedure Laterality Date  . CHOLECYSTECTOMY    . TUBAL LIGATION      Family History  Problem Relation Age of Onset  . Cancer Father   . Hypertension Maternal Aunt   . Breast cancer Maternal Aunt   . Cancer Paternal Grandmother   . Breast cancer Paternal Grandmother   . Breast cancer Paternal Aunt     Social History   Socioeconomic History  . Marital status: Single    Spouse name: Not on file  . Number of children: Not on file  . Years of education: Not on file  . Highest education level: Not on file  Occupational History  . Not on file  Tobacco Use  . Smoking status: Never Smoker  . Smokeless tobacco: Never Used  Substance and Sexual Activity  . Alcohol use: Not Currently    Comment: rare  . Drug use: Yes    Frequency: 1.0 times per week    Types: Marijuana  . Sexual activity: Yes    Birth control/protection: Condom, Surgical  Other Topics Concern  . Not on file  Social History Narrative  . Not on file   Social Determinants of Health   Financial Resource Strain:   . Difficulty of Paying Living Expenses: Not on file  Food Insecurity: Food Insecurity Present  . Worried About Charity fundraiser in the Last Year: Sometimes true  . Ran Out of Food in the Last Year: Sometimes true  Transportation Needs: No Transportation Needs  .  Lack of Transportation (Medical): No  . Lack of Transportation (Non-Medical): No  Physical Activity:   . Days of Exercise per Week: Not on file  . Minutes of Exercise per Session: Not on file  Stress:   . Feeling of Stress : Not on file  Social Connections:   . Frequency of Communication with Friends and Family: Not on file  . Frequency of Social Gatherings with Friends and Family: Not on file  . Attends Religious Services: Not on file  . Active Member of Clubs or Organizations: Not on file  . Attends Archivist Meetings: Not on file  . Marital Status: Not on file  Intimate Partner Violence:   . Fear of Current or Ex-Partner: Not on file  . Emotionally Abused: Not on file  . Physically Abused: Not on file  . Sexually Abused: Not on file    Outpatient Medications Prior to Visit  Medication Sig Dispense Refill  . albuterol (PROVENTIL HFA;VENTOLIN HFA) 108 (90 Base) MCG/ACT inhaler Inhale 2 puffs into the lungs every 6 (six) hours as needed for wheezing or shortness of breath. Reported on 09/29/2015    . albuterol (PROVENTIL) (2.5  MG/3ML) 0.083% nebulizer solution Take 3 mLs (2.5 mg total) by nebulization every 6 (six) hours as needed for wheezing or shortness of breath. 75 mL 0  . artificial tears (LACRILUBE) OINT ophthalmic ointment Place into both eyes at bedtime as needed for dry eyes. (Patient not taking: Reported on 01/14/2020) 3.5 g 0  . busPIRone (BUSPAR) 10 MG tablet Take 1 tablet (10 mg total) by mouth 3 (three) times daily. 90 tablet 2  . diclofenac Sodium (VOLTAREN) 1 % GEL Apply 2 g topically 4 (four) times daily. (Patient not taking: Reported on 01/14/2020) 2 g 1  . glucose blood test strip Check your sugar in the morning before you eat breakfast, and one hour after a meal. 100 each 2  . glucose monitoring kit (FREESTYLE) monitoring kit 1 each by Does not apply route daily. Check glucose once in the morning before breakfast and 1 hour after a meal 1 each 0  .  hydrOXYzine (ATARAX/VISTARIL) 10 MG tablet Take 1 tablet (10 mg total) by mouth 3 (three) times daily as needed. 90 tablet 2  . ibuprofen (ADVIL) 600 MG tablet Take 1 tablet (600 mg total) by mouth every 6 (six) hours as needed. (Patient not taking: Reported on 01/14/2020) 30 tablet 0  . meclizine (ANTIVERT) 25 MG tablet Take 1 tablet (25 mg total) by mouth 3 (three) times daily as needed for dizziness. (Patient not taking: Reported on 07/21/2018) 30 tablet 0  . medroxyPROGESTERone (PROVERA) 10 MG tablet Take 2 tablets (20 mg total) by mouth daily. (Patient not taking: Reported on 01/14/2020) 60 tablet 2  . metFORMIN (GLUCOPHAGE) 1000 MG tablet Take 1 tablet (1,000 mg total) by mouth 2 (two) times daily with a meal. (Patient not taking: Reported on 01/14/2020) 90 tablet 3  . methocarbamol (ROBAXIN) 500 MG tablet Take 1 tablet (500 mg total) by mouth 2 (two) times daily as needed for muscle spasms. (Patient taking differently: Take 500 mg by mouth 2 (two) times daily as needed for muscle spasms. PT states she is unable to get filled) 20 tablet 0  . mirtazapine (REMERON) 15 MG tablet Take 1 tablet (15 mg total) by mouth at bedtime. 30 tablet 2  . PE-DM-APAP & Doxylamin-DM-APAP (VICKS DAYQUIL/NYQUIL CLD & FLU) (Liquid) MISC Take 30 mLs by mouth 2 (two) times daily as needed (cough). (Patient not taking: Reported on 01/14/2020)    . SUMAtriptan (IMITREX) 100 MG tablet Take 1 tablet (100 mg total) by mouth every 2 (two) hours as needed for migraine. May repeat in 2 hours if headache persists or recurs. No more than 2 in a 24 hour period (Patient not taking: Reported on 11/23/2019) 10 tablet 0  . amLODipine (NORVASC) 5 MG tablet Take 1 tablet (5 mg total) by mouth daily. 90 tablet 1   Facility-Administered Medications Prior to Visit  Medication Dose Route Frequency Provider Last Rate Last Admin  . medroxyPROGESTERone (DEPO-PROVERA) injection 150 mg  150 mg Intramuscular Q90 days Chancy Milroy, MD   150 mg at  10/29/19 1032    Allergies  Allergen Reactions  . Mushroom Extract Complex Anaphylaxis  . Latex Itching and Swelling  . Penicillins Itching    Has patient had a PCN reaction causing immediate rash, facial/tongue/throat swelling, SOB or lightheadedness with hypotension: No Has patient had a PCN reaction causing severe rash involving mucus membranes or skin necrosis: No Has patient had a PCN reaction that required hospitalization No Has patient had a PCN reaction occurring within the last 10 years:  No If all of the above answers are "NO", then may proceed with Cephalosporin use.     Review of Systems  Constitutional: Positive for fatigue. Negative for chills and fever.  Eyes: Positive for visual disturbance.  Respiratory: Negative for cough and shortness of breath.   Cardiovascular: Negative for chest pain and palpitations.  Gastrointestinal: Positive for nausea. Negative for vomiting.  Endocrine: Positive for polydipsia and polyuria.  Neurological: Positive for light-headedness and headaches. Negative for syncope and weakness.       Objective:    Physical Exam Constitutional:      General: She is not in acute distress.    Appearance: Normal appearance.  Cardiovascular:     Rate and Rhythm: Normal rate and regular rhythm.  Pulmonary:     Effort: Pulmonary effort is normal.     Breath sounds: Normal breath sounds.  Abdominal:     General: There is no distension.     Palpations: Abdomen is soft.     Tenderness: There is no abdominal tenderness.  Musculoskeletal:     Right lower leg: No edema.     Left lower leg: No edema.  Neurological:     General: No focal deficit present.     Mental Status: She is alert.     BP 133/89 (BP Location: Left Arm, Patient Position: Sitting, Cuff Size: Normal)   Pulse 87   Wt 206 lb 9.6 oz (93.7 kg)   SpO2 100%   BMI 36.60 kg/m  Wt Readings from Last 3 Encounters:  02/19/20 206 lb 9.6 oz (93.7 kg)  01/14/20 211 lb 6.4 oz (95.9 kg)   12/09/19 205 lb 9.6 oz (93.3 kg)    Health Maintenance Due  Topic Date Due  . Hepatitis C Screening  Never done  . PNEUMOCOCCAL POLYSACCHARIDE VACCINE AGE 59-64 HIGH RISK  Never done  . FOOT EXAM  Never done  . OPHTHALMOLOGY EXAM  Never done  . URINE MICROALBUMIN  Never done  . COVID-19 Vaccine (1) Never done  . TETANUS/TDAP  Never done  . INFLUENZA VACCINE  Never done  . PAP SMEAR-Modifier  03/21/2020    There are no preventive care reminders to display for this patient.   Lab Results  Component Value Date   TSH 1.279 05/29/2017   Lab Results  Component Value Date   WBC 11.2 (H) 12/08/2019   HGB 11.6 (L) 12/08/2019   HCT 36.7 12/08/2019   MCV 90.0 12/08/2019   PLT 449 (H) 12/08/2019   Lab Results  Component Value Date   NA 142 12/08/2019   K 3.6 12/08/2019   CO2 22 12/08/2019   GLUCOSE 120 (H) 12/08/2019   BUN <5 (L) 12/08/2019   CREATININE 0.70 12/08/2019   BILITOT 0.4 11/13/2019   ALKPHOS 51 11/13/2019   AST 18 11/13/2019   ALT 25 11/13/2019   PROT 7.7 11/13/2019   ALBUMIN 4.5 11/13/2019   CALCIUM 9.4 12/08/2019   ANIONGAP 10 12/08/2019   No results found for: CHOL No results found for: HDL No results found for: LDLCALC No results found for: TRIG No results found for: CHOLHDL Lab Results  Component Value Date   HGBA1C 11.3 (A) 02/19/2020       Assessment & Plan:   Problem List Items Addressed This Visit      Cardiovascular and Mediastinum   Hypertension   Relevant Medications   amLODipine (NORVASC) 5 MG tablet     Endocrine   Type 2 diabetes mellitus without complication, without long-term  current use of insulin ([place]) - Primary    Patient presents for progressive symptomatic hyperglycemia over the last 2 weeks. Primarily experiencing fatigue, light headedness, blurred vision, polyuria, polydipsia, and nausea. She has been compliant with Metformin 1000 mg BID. Despite this, A1C has remained unchanged at 11.3 compared to 3 months ago.  POC CBG  today is 393 and POC U/A dipstick negative for ketones. To avoid risk of developing a hyperglycemic crisis in the future, will go ahead and initiate long-acting and meal time insulin which she is amenable to. Wight based insulin calculation is approximately 40 units TDD. She mentions she only eats two full meals per day so will do Lantus 20 units qhs and Novolog 10 BID with meals. Return in 2 weeks for glucometer reading and insulin adjustment as needed.       Relevant Medications   insulin glargine (LANTUS SOLOSTAR) 100 UNIT/ML Solostar Pen   insulin aspart (NOVOLOG) 100 UNIT/ML FlexPen   Other Relevant Orders   POC Hbg A1C (Completed)   BMP8+Anion Gap   POCT Urinalysis Dipstick (21224) (Completed)       Meds ordered this encounter  Medications  . DISCONTD: insulin aspart (NOVOLOG) 100 UNIT/ML FlexPen    Sig: Inject 10 Units into the skin 2 (two) times daily with a meal.    Dispense:  3 mL    Refill:  2  . insulin glargine (LANTUS SOLOSTAR) 100 UNIT/ML Solostar Pen    Sig: Inject 20 Units into the skin daily at 10 pm.    Dispense:  3 mL    Refill:  1    IM program  . insulin aspart (NOVOLOG) 100 UNIT/ML FlexPen    Sig: Inject 10 Units into the skin 2 (two) times daily with a meal.    Dispense:  3 mL    Refill:  2    IM program  . Insulin Pen Needle (UNIFINE PEN NEEDLES) 32G X 4 MM MISC    Sig: 1 application by Does not apply route in the morning, at noon, and at bedtime.    Dispense:  200 each    Refill:  1    IM program  . amLODipine (NORVASC) 5 MG tablet    Sig: Take 1 tablet (5 mg total) by mouth daily.    Dispense:  90 tablet    Refill:  1    IM program     Laisha Rau D Vyctoria Dickman, DO

## 2020-02-19 NOTE — Addendum Note (Signed)
Addended by: Lalla Brothers T on: 02/19/2020 03:16 PM   Modules accepted: Level of Service

## 2020-02-19 NOTE — Progress Notes (Signed)
Met without help  Comments  States Correct name, dose, onset, peak and duration of own insulin  X   States appropriate timing of injections  X   Describes appropriate site selection/rotation  X   Prepares single type of insulin pen, performs a priming dose and gives practice injection with accuracy  X   Gives herself a practice saline injection in abdomen  X   Describes appropriate storage of insulin   Plan to cover at follow up  Describes appropriate needle disposal  X   Describes appropriate prevention and treatment of hypoglycemia  X   Understands to take insulin on sick days   Plan to cover at follow up  Dana Hughes, Dana Hughes 02/19/2020 3:16 PM.  Dana Hughes was  instructed to take the following: Per Dr. Koleen Distance, Lantus 20 units at the same time each day and Novlog 15 minutes before her two meals that she eats per day. Sample pen needles and instruction handout given to her today.   Comments: Dana Hughes reports strong family history of diabetes in both her parents and grandparents. She has been reading about diabetes on the internet and chekcing her blood sugar. she asks approporate questions.

## 2020-02-19 NOTE — Progress Notes (Signed)
Internal Medicine Clinic Attending  Case discussed with Dr. Bloomfield  At the time of the visit.  We reviewed the resident's history and exam and pertinent patient test results.  I agree with the assessment, diagnosis, and plan of care documented in the resident's note.  

## 2020-02-20 LAB — BMP8+ANION GAP
Anion Gap: 14 mmol/L (ref 10.0–18.0)
BUN/Creatinine Ratio: 7 — ABNORMAL LOW (ref 9–23)
BUN: 5 mg/dL — ABNORMAL LOW (ref 6–20)
CO2: 17 mmol/L — ABNORMAL LOW (ref 20–29)
Calcium: 9.5 mg/dL (ref 8.7–10.2)
Chloride: 105 mmol/L (ref 96–106)
Creatinine, Ser: 0.73 mg/dL (ref 0.57–1.00)
GFR calc Af Amer: 120 mL/min/{1.73_m2} (ref >59)
GFR calc non Af Amer: 104 mL/min/{1.73_m2} (ref >59)
Glucose: 394 mg/dL — ABNORMAL HIGH (ref 65–99)
Potassium: 4 mmol/L (ref 3.5–5.2)
Sodium: 136 mmol/L (ref 134–144)

## 2020-02-22 ENCOUNTER — Encounter (HOSPITAL_COMMUNITY): Payer: Self-pay

## 2020-02-22 ENCOUNTER — Ambulatory Visit (HOSPITAL_COMMUNITY)
Admission: EM | Admit: 2020-02-22 | Discharge: 2020-02-22 | Disposition: A | Discharge status: Home / Self Care | Payer: Self-pay | Attending: Family Medicine | Admitting: Family Medicine

## 2020-02-22 ENCOUNTER — Telehealth: Payer: Self-pay | Admitting: *Deleted

## 2020-02-22 ENCOUNTER — Emergency Department (HOSPITAL_COMMUNITY): Payer: Self-pay

## 2020-02-22 ENCOUNTER — Other Ambulatory Visit: Payer: Self-pay

## 2020-02-22 ENCOUNTER — Emergency Department (HOSPITAL_COMMUNITY)
Admission: EM | Admit: 2020-02-22 | Discharge: 2020-02-22 | Disposition: A | Discharge status: Home / Self Care | Payer: Self-pay | Attending: Emergency Medicine | Admitting: Emergency Medicine

## 2020-02-22 DIAGNOSIS — I10 Essential (primary) hypertension: Secondary | ICD-10-CM | POA: Insufficient documentation

## 2020-02-22 DIAGNOSIS — Z7984 Long term (current) use of oral hypoglycemic drugs: Secondary | ICD-10-CM | POA: Insufficient documentation

## 2020-02-22 DIAGNOSIS — Z87891 Personal history of nicotine dependence: Secondary | ICD-10-CM | POA: Insufficient documentation

## 2020-02-22 DIAGNOSIS — R739 Hyperglycemia, unspecified: Secondary | ICD-10-CM

## 2020-02-22 DIAGNOSIS — Z794 Long term (current) use of insulin: Secondary | ICD-10-CM | POA: Insufficient documentation

## 2020-02-22 DIAGNOSIS — J45909 Unspecified asthma, uncomplicated: Secondary | ICD-10-CM | POA: Insufficient documentation

## 2020-02-22 DIAGNOSIS — R079 Chest pain, unspecified: Secondary | ICD-10-CM

## 2020-02-22 DIAGNOSIS — Z20822 Contact with and (suspected) exposure to covid-19: Secondary | ICD-10-CM | POA: Insufficient documentation

## 2020-02-22 DIAGNOSIS — Z79899 Other long term (current) drug therapy: Secondary | ICD-10-CM | POA: Insufficient documentation

## 2020-02-22 DIAGNOSIS — E1165 Type 2 diabetes mellitus with hyperglycemia: Secondary | ICD-10-CM | POA: Insufficient documentation

## 2020-02-22 HISTORY — DX: Type 2 diabetes mellitus without complications: E11.9

## 2020-02-22 HISTORY — DX: Endometriosis, unspecified: N80.9

## 2020-02-22 HISTORY — DX: Leiomyoma of uterus, unspecified: D25.9

## 2020-02-22 LAB — URINALYSIS, ROUTINE W REFLEX MICROSCOPIC
Bacteria, UA: NONE SEEN
Bilirubin Urine: NEGATIVE
Glucose, UA: >=500 mg/dL — AB
Hgb urine dipstick: NEGATIVE
Ketones, ur: NEGATIVE mg/dL
Leukocytes,Ua: NEGATIVE
Nitrite: NEGATIVE
Protein, ur: NEGATIVE mg/dL
Specific Gravity, Urine: 1.04 — ABNORMAL HIGH (ref 1.005–1.030)
pH: 6 (ref 5.0–8.0)

## 2020-02-22 LAB — BASIC METABOLIC PANEL
Anion gap: 11 (ref 5–15)
BUN: 6 mg/dL (ref 6–20)
CO2: 22 mmol/L (ref 22–32)
Calcium: 9.4 mg/dL (ref 8.9–10.3)
Chloride: 104 mmol/L (ref 98–111)
Creatinine, Ser: 0.72 mg/dL (ref 0.44–1.00)
GFR, Estimated: >60 mL/min (ref >60)
Glucose, Bld: 362 mg/dL — ABNORMAL HIGH (ref 70–99)
Potassium: 3.7 mmol/L (ref 3.5–5.1)
Sodium: 137 mmol/L (ref 135–145)

## 2020-02-22 LAB — RESP PANEL BY RT PCR (RSV, FLU A&B, COVID)
Influenza A by PCR: NEGATIVE
Influenza B by PCR: NEGATIVE
Respiratory Syncytial Virus by PCR: NEGATIVE
SARS Coronavirus 2 by RT PCR: NEGATIVE

## 2020-02-22 LAB — CBG MONITORING, ED
Glucose-Capillary: 416 mg/dL — ABNORMAL HIGH (ref 70–99)
Glucose-Capillary: 348 mg/dL — ABNORMAL HIGH (ref 70–99)
Glucose-Capillary: 233 mg/dL — ABNORMAL HIGH (ref 70–99)

## 2020-02-22 LAB — CBC
HCT: 39.3 % (ref 36.0–46.0)
Hemoglobin: 12.8 g/dL (ref 12.0–15.0)
MCH: 27.9 pg (ref 26.0–34.0)
MCHC: 32.6 g/dL (ref 30.0–36.0)
MCV: 85.6 fL (ref 80.0–100.0)
Platelets: 400 10*3/uL (ref 150–400)
RBC: 4.59 MIL/uL (ref 3.87–5.11)
RDW: 13.8 % (ref 11.5–15.5)
WBC: 8.7 10*3/uL (ref 4.0–10.5)
nRBC: 0 % (ref 0.0–0.2)

## 2020-02-22 LAB — D-DIMER, QUANTITATIVE: D-Dimer, Quant: 0.43 ug/mL-FEU (ref 0.00–0.50)

## 2020-02-22 LAB — I-STAT BETA HCG BLOOD, ED (MC, WL, AP ONLY): I-stat hCG, quantitative: <5 m[IU]/mL (ref <5)

## 2020-02-22 LAB — TROPONIN I (HIGH SENSITIVITY): Troponin I (High Sensitivity): <2 ng/L (ref <18)

## 2020-02-22 NOTE — Discharge Instructions (Addendum)
Seen here for hyperglycemia.  Lab work and imaging all looks reassuring.  I recommend continuing with your diabetes medications as prescribed. you will take your NovoLog twice a day with meals. Lantus 20 units at nighttime and Metformin 1000 mg twice daily. also given information for healthy food choices to prevent high blood sugars.  Please be aware that your diabetes medications can cause you to have very low blood sugars.  It is important whenever taking your diabetes medications that you are eating something and you check your sugars frequently.  If you develop low blood sugar, become  fatigue,  sweaty, weak or confused you need to increase your glucose level.  You can eat candies or drink juices or soda to bring it up.  Please follow-up with your PCP for further evaluation.  Come back to the emergency department if you develop chest pain, shortness of breath, severe abdominal pain, uncontrolled nausea, vomiting, diarrhea.

## 2020-02-22 NOTE — ED Triage Notes (Signed)
Patient states she at 0800 today her CBG WAS 400. PATIENT WENT TO AN uc AND cbg WAS 416. TODAY IN TRIAGE cbg-348. Patient states she did take her Metformin today. Patient states she did have nausea and abdominal pain, but none today.

## 2020-02-22 NOTE — ED Triage Notes (Signed)
Pt states she was recently dx with DM2 and for the past 10 days her home glucose checks have been in the high 400s. States she saw her PCP on Friday and it was greater than 500 and was given insulin and metformin. Has been self-administering insulin over the weekend along with metformin. Home glucose level was 400 at approx 0800. Was Rx Lantus but has not taken it, b/c pt wasn't certain if she was supposed to take it or not. Took novolog 10 units this morning at approx 0830.   Pt c/o general ill feeling. Abdominal pain LUQ. And mild mid-sternal CP sharp in nature 2/10 scale. States she had diminished consciousness last night briefly and her boyfriend had to stimulate her to alertness, had diarrhea and nausea after eating last night. Tolerating po water.  Denies vomiting, diaphoresis, fever, dysuria sx.CBG performed 416. Bilateral breath sounds CTA, pt slightly tachypneic.  EKG performed and given to Dr. Meda Coffee. Dr. Meda Coffee advised for pt to go to ED STAT for hyperglycemia. Patient is being discharged from the Urgent Care and sent to the Emergency Department via POV . Per Dr. Meda Coffee patient is in need of higher level of care due to uncontrolled hyperglycemia, nausea, diarrhea. Patient is aware and verbalizes understanding of plan of care.  Vitals:   02/22/20 0959  BP: (!) 135/95  Pulse: 88  Resp: (!) 24  Temp: (!) 97.4 F (36.3 C)  SpO2: 100%

## 2020-02-22 NOTE — ED Provider Notes (Signed)
[place] DEPT Provider Note   CSN: [id number] Arrival date & time: 02/22/20  1055     History Chief Complaint  Patient presents with  . Hyperglycemia    Dana Hughes is a 39 y.o. female.  HPI   Patient with significant medical history of asthma, hypertension, type 2 diabetes currently on Metformin, Lantus, NovoLog presents to the emergency department  With chief complaint of elevated blood sugars.  She was just seen at her primary care office on 10/29 where they started patient on Lantus and NovoLog as her A1c is 11.2.  She was instructed to continue with her Metformin 1000 mg 2 times daily, Lantus 20 units every 4 hours and NovoLog 10 units twice a day.  Patient endorses that she was confused on when she was supposed to take her medications and has only been taking her NovoLog and Metformin.  She endorses that she has had abdominal pain, nausea, fatigue, extreme thirst, and frequent urination.  She also endorses that she has had a slight substernal chest pain for last 2 weeks.  She states the pain comes and goes last about 45 minutes denies it radiating or developing nausea or vomiting, shortness of breath.  She endorses that she has acid reflux and has been taking more Tums lately.  Patient has no cardiac history, no history of DVTs or PEs.  Patient denies headache, fever, chills, shortness of breath, vomiting, diarrhea, pedal edema.  Past Medical History:  Diagnosis Date  . Anxiety   . Arthritis   . Asthma   . BV (bacterial vaginosis)   . Candidiasis   . Depression   . Diabetes mellitus without complication ([place])   . Endometriosis   . Headache(784.0)    migraines  . Hypertension    no longer takes meds  . Migraines   . Muscle spasm   . Ovarian cyst   . Pelvic pain in female 09/29/2015  . Scoliosis   . Uterine fibroid     Patient Active Problem List   Diagnosis Date Noted  . Type 2 diabetes mellitus without complication, without  long-term current use of insulin ([place]) 12/09/2019  . Costochondritis, acute 12/09/2019  . Abnormal uterine bleeding (AUB) 02/20/2019  . History of bilateral tubal ligation 02/20/2019  . Carpal tunnel syndrome, bilateral 01/16/2019  . Hypertension 07/21/2018  . Endometriosis 08/30/2017    Past Surgical History:  Procedure Laterality Date  . CHOLECYSTECTOMY    . TUBAL LIGATION       OB History    Gravida  4   Para  3   Term  3   Preterm  0   AB  1   Living  3     SAB  1   TAB  0   Ectopic  0   Multiple  0   Live Births  3        Obstetric Comments  SVD x 3        Family History  Problem Relation Age of Onset  . Cancer Father   . Hypertension Maternal Aunt   . Breast cancer Maternal Aunt   . Cancer Paternal Grandmother   . Breast cancer Paternal Grandmother   . Breast cancer Paternal Aunt     Social History   Tobacco Use  . Smoking status: Former Research scientist (life sciences)  . Smokeless tobacco: Never Used  Vaping Use  . Vaping Use: Never used  Substance Use Topics  . Alcohol use: Not Currently  . Drug  use: Yes    Frequency: 1.0 times per week    Types: Marijuana    Home Medications Prior to Admission medications   Medication Sig Start Date End Date Taking? Authorizing Provider  albuterol (PROVENTIL HFA;VENTOLIN HFA) 108 (90 Base) MCG/ACT inhaler Inhale 2 puffs into the lungs every 6 (six) hours as needed for wheezing or shortness of breath. Reported on 09/29/2015    [provider]  albuterol (PROVENTIL) (2.5 MG/3ML) 0.083% nebulizer solution Take 3 mLs (2.5 mg total) by nebulization every 6 (six) hours as needed for wheezing or shortness of breath. 07/02/18   Wurst, Brittany, PA-C  amLODipine (NORVASC) 5 MG tablet Take 1 tablet (5 mg total) by mouth daily. 02/19/20   Bloomfield, Carley D, DO  artificial tears (LACRILUBE) OINT ophthalmic ointment Place into both eyes at bedtime as needed for dry eyes. Patient not taking: Reported on 01/14/2020 05/02/18    Murray, Alyssa B, PA-C  busPIRone (BUSPAR) 10 MG tablet Take 1 tablet (10 mg total) by mouth 3 (three) times daily. 12/09/19   Parsons, Brittney E, NP  diclofenac Sodium (VOLTAREN) 1 % GEL Apply 2 g topically 4 (four) times daily. Patient not taking: Reported on 01/14/2020 12/09/19   Masoudi, Elhamalsadat, MD  glucose blood test strip Check your sugar in the morning before you eat breakfast, and one hour after a meal. 11/12/19   Mortenson, Ashley, MD  glucose monitoring kit (FREESTYLE) monitoring kit 1 each by Does not apply route daily. Check glucose once in the morning before breakfast and 1 hour after a meal 11/12/19   Mortenson, Ashley, MD  hydrOXYzine (ATARAX/VISTARIL) 10 MG tablet Take 1 tablet (10 mg total) by mouth 3 (three) times daily as needed. 12/09/19   Parsons, Brittney E, NP  ibuprofen (ADVIL) 600 MG tablet Take 1 tablet (600 mg total) by mouth every 6 (six) hours as needed. Patient not taking: Reported on 01/14/2020 11/12/19   Mortenson, Ashley, MD  insulin aspart (NOVOLOG) 100 UNIT/ML FlexPen Inject 10 Units into the skin 2 (two) times daily with a meal. 02/19/20   Bloomfield, Carley D, DO  insulin glargine (LANTUS SOLOSTAR) 100 UNIT/ML Solostar Pen Inject 20 Units into the skin daily at 10 pm. 02/19/20   Bloomfield, Carley D, DO  Insulin Pen Needle (UNIFINE PEN NEEDLES) 32G X 4 MM MISC 1 application by Does not apply route in the morning, at noon, and at bedtime. 02/19/20   Bloomfield, Carley D, DO  meclizine (ANTIVERT) 25 MG tablet Take 1 tablet (25 mg total) by mouth 3 (three) times daily as needed for dizziness. Patient not taking: Reported on 07/21/2018 05/06/18   Mortenson, Ashley, MD  medroxyPROGESTERone (PROVERA) 10 MG tablet Take 2 tablets (20 mg total) by mouth daily. Patient not taking: Reported on 01/14/2020 11/23/19   Ervin, Michael L, MD  metFORMIN (GLUCOPHAGE) 1000 MG tablet Take 1 tablet (1,000 mg total) by mouth 2 (two) times daily with a meal. Patient not taking: Reported on  01/14/2020 12/09/19 12/08/20  Masoudi, Elhamalsadat, MD  methocarbamol (ROBAXIN) 500 MG tablet Take 1 tablet (500 mg total) by mouth 2 (two) times daily as needed for muscle spasms. Patient taking differently: Take 500 mg by mouth 2 (two) times daily as needed for muscle spasms. PT states she is unable to get filled 01/13/19   Tate, Kelly H, NP  mirtazapine (REMERON) 15 MG tablet Take 1 tablet (15 mg total) by mouth at bedtime. 12/09/19   Parsons, Brittney E, NP  PE-DM-APAP & Doxylamin-DM-APAP (VICKS DAYQUIL/NYQUIL   CLD & FLU) (Liquid) MISC Take 30 mLs by mouth 2 (two) times daily as needed (cough). Patient not taking: Reported on 01/14/2020    [provider]  SUMAtriptan (IMITREX) 100 MG tablet Take 1 tablet (100 mg total) by mouth every 2 (two) hours as needed for migraine. May repeat in 2 hours if headache persists or recurs. No more than 2 in a 24 hour period Patient not taking: Reported on 11/23/2019 10/17/19   Young, Michelle G, PA-C    Allergies    Mushroom extract complex, Latex, and Penicillins  Review of Systems   Review of Systems  Constitutional: Negative for chills and fever.  HENT: Negative for congestion, tinnitus, trouble swallowing and voice change.   Eyes: Negative for visual disturbance.  Respiratory: Negative for shortness of breath.   Cardiovascular: Positive for chest pain. Negative for palpitations.  Gastrointestinal: Positive for diarrhea and nausea. Negative for abdominal pain, constipation and vomiting.  Genitourinary: Positive for frequency. Negative for dyspareunia, dysuria, enuresis, hematuria, menstrual problem and vaginal bleeding.  Musculoskeletal: Negative for back pain.  Skin: Negative for rash.  Neurological: Negative for dizziness and headaches.  Hematological: Does not bruise/bleed easily.    Physical Exam Updated Vital Signs BP (!) 125/99 (BP Location: Right Arm)   Pulse 74   Temp 98.1 F (36.7 C) (Oral)   Resp 15   Ht 5' 3" (1.6 m)   Wt 93.8  kg   SpO2 100%   BMI 36.65 kg/m   Physical Exam Vitals and nursing note reviewed.  Constitutional:      General: She is not in acute distress.    Appearance: She is not ill-appearing.  HENT:     Head: Normocephalic and atraumatic.     Nose: No congestion.     Mouth/Throat:     Mouth: Mucous membranes are moist.     Pharynx: Oropharynx is clear.  Eyes:     General: No scleral icterus. Cardiovascular:     Rate and Rhythm: Normal rate and regular rhythm.     Pulses: Normal pulses.     Heart sounds: No murmur heard.  No friction rub. No gallop.   Pulmonary:     Effort: No respiratory distress.     Breath sounds: No wheezing, rhonchi or rales.  Abdominal:     General: There is distension.     Palpations: Abdomen is soft.     Tenderness: There is no abdominal tenderness. There is no right CVA tenderness, left CVA tenderness or guarding.     Comments: Abdomen was nondistended, normoactive bowel sounds, dull to percussion, slight epigastric pain upon palpation, no rebound tenderness, no peritoneal sign noted.  Musculoskeletal:        General: No swelling.     Right lower leg: No edema.     Left lower leg: No edema.  Skin:    General: Skin is warm and dry.     Findings: No rash.  Neurological:     Mental Status: She is alert.  Psychiatric:        Mood and Affect: Mood normal.     ED Results / Procedures / Treatments   Labs (all labs ordered are listed, but only abnormal results are displayed) Labs Reviewed  BASIC METABOLIC PANEL - Abnormal; Notable for the following components:      Result Value   Glucose, Bld 362 (*)    All other components within normal limits  URINALYSIS, ROUTINE W REFLEX MICROSCOPIC - Abnormal; Notable for the following   components:   Color, Urine STRAW (*)    Specific Gravity, Urine 1.040 (*)    Glucose, UA >=500 (*)    All other components within normal limits  CBG MONITORING, ED - Abnormal; Notable for the following components:    Glucose-Capillary 348 (*)    All other components within normal limits  CBG MONITORING, ED - Abnormal; Notable for the following components:   Glucose-Capillary 233 (*)    All other components within normal limits  RESP PANEL BY RT PCR (RSV, FLU A&B, COVID)  CBC  D-DIMER, QUANTITATIVE (NOT AT [facility])  I-STAT BETA HCG BLOOD, ED ([facility], [facility], [facility] ONLY)  CBG MONITORING, ED  TROPONIN I (HIGH SENSITIVITY)    EKG EKG Interpretation  Date/Time:  Monday February 22 2020 13:44:54 EDT Ventricular Rate:  81 PR Interval:    QRS Duration: 89 QT Interval:  384 QTC Calculation: 446 R Axis:   -52 Text Interpretation: Sinus rhythm Borderline short PR interval Left anterior fascicular block Abnormal R-wave progression, early transition Left ventricular hypertrophy Anterior Q waves, possibly due to LVH Nonspecific T abnormalities, inferior leads No significant change since last tracing Confirmed by Zackowski, Scott ([id number]) on 02/22/2020 2:00:28 PM   Radiology DG Chest 2 View  Result Date: 02/22/2020 CLINICAL DATA:  Chest pain, nausea, abdominal pain, shortness of breath, history hypertension, asthma, diabetes mellitus EXAM: CHEST - 2 VIEW COMPARISON:  12/08/2019 FINDINGS: Normal heart size, mediastinal contours, and pulmonary vascularity. Lungs clear. No pleural effusion or pneumothorax. Bones unremarkable. Surgical clips RIGHT upper quadrant likely reflect prior cholecystectomy. IMPRESSION: Normal exam. Electronically Signed   By: Mark  Boles M.D.   On: 02/22/2020 13:42    Procedures Procedures (including critical care time)  Medications Ordered in ED Medications - No data to display  ED Course  I have reviewed the triage vital signs and the nursing notes.  Pertinent labs & imaging results that were available during my care of the patient were reviewed by me and considered in my medical decision making (see chart for details).    MDM Rules/Calculators/A&P                          I have  personally reviewed all imaging, labs and have interpreted them.  Patient presents with elevated blood sugar and chest pain.  She is alert, did not appear in acute distress, vital signs reassuring.  Will order basic labs, chest x-ray, EKG, D-dimer and troponin for further evaluation.   CBC negative for leukocytosis or signs of anemia.  BMP negative for electrolyte abnormalities, no metabolic acidosis, hyperglycemia of 362, no AKI, no anion gap noted.  hCG was less than 5 respiratory panel negative for Covid, influenza A/B, RSV.  UA negative for nitrates leukocytes, no hematuria, no bacteria noted.  D-dimer 0.43.  Troponin less than 2.  Chest x-ray shows no acute abnormalities.  EKG sinus rhythm without signs of ischemia no ST elevation depression noted.  Patient was reassessed, point-of-care glucose was obtained and it went from 362 down to 233.  Patient is tolerating p.o. denies any abdominal pain nausea vomiting diarrhea.  I have low suspicion for ACS as history is atypical, patient has no cardiac history, EKG was sinus rhythm without signs of ischemia, initial troponin is less than 2 will defer second troponin at this time as chest pain has been going on for 2 weeks.  Low suspicion for PE as patient denies pleuritic chest pain, shortness of breath, D-dimer was   0.43.  Low suspicion for AAA or aortic dissection as history is atypical, patient has low risk factors.  Low suspicion for HSS or DKA as BMP does not show metabolic acidosis, no anion gap noted, no ketones noted in urine.  Low suspicion for systemic infection as patient is nontoxic-appearing, vital signs reassuring, no obvious source infection noted on exam.  I suspect patient's hyperglycemia secondary to noncompliance  With her medication.  Reviewed medications with her and how to use them and when to use them.  Have also discussed her signs hypocalcemia and how to fix it.  Will have patient follow-up with PCP for further evaluation  management.  Vital signs have remained stable, no indication for hospital admission.  Patient given at home care as well strict return precautions.  Patient verbalized that they understood agreed to said plan.     Final Clinical Impression(s) / ED Diagnoses Final diagnoses:  Hyperglycemia    Rx / DC Orders ED Discharge Orders    None       ,  J, PA-C 02/22/20 1524    Zackowski, Scott, MD 02/23/20 1524  

## 2020-02-22 NOTE — Telephone Encounter (Signed)
Pt calls and request thru front office appt today, she states she went to urg care this am and "they want to transfer her to [facility]" she states she does not want to go and can she come to clinic. Pt is informed that it is her choice, [facility] advises she follow doctors instructions but if she chooses not to do as advised [facility] will offer an appt for pt today

## 2020-02-22 NOTE — Telephone Encounter (Signed)
Called Karyme, she was at [facility] ED. She says she was taking 10 units of the insulin in the blue pen. Unsure if this was Novolog or lantus . Asked her to call to make an appointment here as soon as she is released from the ED.

## 2020-02-23 ENCOUNTER — Ambulatory Visit (INDEPENDENT_AMBULATORY_CARE_PROVIDER_SITE_OTHER): Payer: Self-pay | Admitting: Internal Medicine

## 2020-02-23 ENCOUNTER — Encounter: Payer: Self-pay | Admitting: Internal Medicine

## 2020-02-23 ENCOUNTER — Other Ambulatory Visit: Payer: Self-pay

## 2020-02-23 ENCOUNTER — Telehealth: Payer: Self-pay | Admitting: Dietician

## 2020-02-23 DIAGNOSIS — E119 Type 2 diabetes mellitus without complications: Secondary | ICD-10-CM

## 2020-02-23 DIAGNOSIS — I10 Essential (primary) hypertension: Secondary | ICD-10-CM

## 2020-02-23 LAB — GLUCOSE, CAPILLARY: Glucose-Capillary: 240 mg/dL — ABNORMAL HIGH (ref 70–99)

## 2020-02-23 MED ORDER — INSULIN ASPART 100 UNIT/ML FLEXPEN: 10.0000 [IU] | PEN_INJECTOR | Freq: Two times a day (BID) | SUBCUTANEOUS | 2 refills | Status: DC

## 2020-02-23 MED ORDER — LANTUS SOLOSTAR 100 UNIT/ML ~~LOC~~ SOPN: 20.0000 [IU] | PEN_INJECTOR | Freq: Every day | SUBCUTANEOUS | 1 refills | Status: DC

## 2020-02-23 NOTE — Assessment & Plan Note (Signed)
HTN: The patient's blood pressure during this visit was 125/92. Well controlled.    last blood pressure visits were: BP Readings from Last 3 Encounters:  02/23/20 (!) 125/95  02/22/20 (!) 140/92  02/22/20 (!) 135/95   -Continue Amlodipine 5 mg QD

## 2020-02-23 NOTE — Patient Instructions (Signed)
Thank you for allowing [place] to provide your care today. Today we discussed your elevated blood sugar levels that was likely due to not receiving your long-acting insulin. As we discussed, you should inject: 1-Lantus (long-acting insulin) 20 units at bedtime 2-NovoLog (short acting insulin) 10 units just before meal (twice a day) 3-Metformin 1000 mg twice a day  Check your blood sugar regularly as explained to you. Do not inject your short acting insulin if your blood sugar was low.  Today we made no changes to your medications.    Please follow-up in our clinic in 2 weeks for follow-up of diabetes (or sooner if needed).  Please bring your meter and your medications with you.    Should you have any questions or concerns please call the internal medicine clinic at [phone].

## 2020-02-23 NOTE — Telephone Encounter (Signed)
[place]  Who says they cannot rectify patient getting a  15 days prescription fill. They suggest that we ask for them to dispense as close to a 30 day fill without going over 30 days.

## 2020-02-23 NOTE — Progress Notes (Signed)
   CC: Diabetes follow-up  HPI:  Dana Hughes is a 39 y.o. female with PMHx of IDDM 2, HTN, endometriosis, CTS, IUP, presented for DM follow-up. Please refer to problem based charting for further details and assessment and plan of current problem and chronic medical conditions.   Past Medical History:  Diagnosis Date  . Anxiety   . Arthritis   . Asthma   . BV (bacterial vaginosis)   . Candidiasis   . Depression   . Diabetes mellitus without complication ([place])   . Endometriosis   . Headache(784.0)    migraines  . Hypertension    no longer takes meds  . Migraines   . Muscle spasm   . Ovarian cyst   . Pelvic pain in female 09/29/2015  . Scoliosis   . Uterine fibroid      Review of Systems:   No fever but feels cold. No cough, no SOB. Has rib pain Positive for diarrhea  Physical Exam:  Vitals:   02/23/20 1505  BP: (!) 125/95  Pulse: 66  Temp: 98.3 F (36.8 C)  TempSrc: Oral  SpO2: 100%  Weight: 205 lb 12.8 oz (93.4 kg)  Height: 5\' 2"  (1.575 m)    Constitutional: Well-developed and well-nourished. No acute distress.  HENT:  Head: Normocephalic and atraumatic.  Eyes: Conjunctivae are normal, EOM nl Cardiovascular: RRR, nl S1S2, no murmur,  no LEE Chest: Rib tenderness at left lat chest Respiratory: Effort normal and breath sounds normal. No respiratory distress. No wheezes.  GI: Soft. No distension. There is no tenderness.  Neurological: Is alert and oriented x 3  Skin: Not diaphoretic. No erythema.  Psychiatric: Normal mood and affect. Behavior is normal. Judgment and thought content normal.   Assessment & Plan:   See Encounters Tab for problem based charting.  Patient discussed with Dr. Daryll Drown

## 2020-02-23 NOTE — Assessment & Plan Note (Addendum)
Uncontrolled.  Last HbA1c 02/19/2020 was 11.3. Started on Insulin 4 days ago. (Patient was on Metformin 1000 mg twice daily, and started on Lantus 20 units at bedtime, and NovoLog 10 units twice a day with a meal on last Friday given A1c 11.3. Apparently she forgot to inject her long acting insulin over the weekend (She does not give me clear answer but apparently missed her long acting over the weekend). Her blood sugar remained elevated at 400 and she went to [facility] and then [facility] ED for that and found to have elevated blood sugar (418). CBG (last 3)  Recent Labs    02/22/20 1103 02/22/20 1513 02/23/20 1522  GLUCAP 348* 233* 240*    She also had glucosuria but no ketones and no anion gap or low bicarb on her BMP.No DKA there and she was discharged with same home regimen and to f/u with [place].  BG now 240. Ms. Butch Penny made sure that pt inject insulin correctly. Educated pt about rt dose of insulins that she has been prescribed. She is going to set a reminder for herself.  -Continue insulin glargin 20 u at bedtime. Apparently pt only received 15 days supply prescription last visit. 1 pen sample provided by Ms. Butch Penny today and signed out by me. 1 pen sample provided by Ms Butch Penny  -Continue Aspart insulin 10 u before meal BID. (usually eats 2 meals aday). Apparently  only received 15 days supply  last visit. 1 pen sample provided by Ms. Butch Penny today. -Continue metformin -Come to clinic in 2 weeks or sooner if needed

## 2020-03-03 NOTE — Progress Notes (Signed)
Internal Medicine Clinic Attending  Case discussed with Dr. Masoudi  At the time of the visit.  We reviewed the resident's history and exam and pertinent patient test results.  I agree with the assessment, diagnosis, and plan of care documented in the resident's note.  

## 2020-03-04 ENCOUNTER — Encounter (HOSPITAL_COMMUNITY): Payer: No Payment, Other | Admitting: Psychiatry

## 2020-03-11 ENCOUNTER — Telehealth (INDEPENDENT_AMBULATORY_CARE_PROVIDER_SITE_OTHER): Payer: No Payment, Other | Admitting: Psychiatry

## 2020-03-11 ENCOUNTER — Encounter (HOSPITAL_COMMUNITY): Payer: Self-pay | Admitting: Psychiatry

## 2020-03-11 ENCOUNTER — Other Ambulatory Visit: Payer: Self-pay | Admitting: Psychiatry

## 2020-03-11 ENCOUNTER — Other Ambulatory Visit: Payer: Self-pay

## 2020-03-11 DIAGNOSIS — F411 Generalized anxiety disorder: Secondary | ICD-10-CM | POA: Insufficient documentation

## 2020-03-11 DIAGNOSIS — F333 Major depressive disorder, recurrent, severe with psychotic symptoms: Secondary | ICD-10-CM | POA: Insufficient documentation

## 2020-03-11 MED ORDER — HYDROXYZINE HCL 10 MG PO TABS: 10.0000 mg | ORAL_TABLET | Freq: Three times a day (TID) | ORAL | 2 refills | Status: DC | PRN

## 2020-03-11 MED ORDER — MIRTAZAPINE 30 MG PO TABS: 30.0000 mg | ORAL_TABLET | Freq: Every day | ORAL | 2 refills | Status: DC

## 2020-03-11 MED ORDER — BUSPIRONE HCL 10 MG PO TABS: 10.0000 mg | ORAL_TABLET | Freq: Three times a day (TID) | ORAL | 2 refills | Status: DC

## 2020-03-11 MED ORDER — TRAZODONE HCL 50 MG PO TABS: 50.0000 mg | ORAL_TABLET | Freq: Every evening | ORAL | 2 refills | Status: DC | PRN

## 2020-03-11 MED ORDER — ARIPIPRAZOLE 5 MG PO TABS: 5.0000 mg | ORAL_TABLET | Freq: Every day | ORAL | 2 refills | Status: DC

## 2020-03-11 MED FILL — ?TRAZODONE HCL 50 TABS: 50 | 30 days supply | Qty: 30 | Fill #0

## 2020-03-11 MED FILL — ?MIRTAZAPINE 30 MG TABLET: 30 | 30 days supply | Qty: 30 | Fill #0

## 2020-03-11 MED FILL — hydrOXYzine HCL 10 MG TABS: 10 | 30 days supply | Qty: 90 | Fill #0

## 2020-03-11 MED FILL — ?BUSPIRONE HCL 10 MG TABLET: 10 | 30 days supply | Qty: 90 | Fill #0

## 2020-03-11 MED FILL — ?ARIPRAZOLE 5 MG TABS: 5 | 30 days supply | Qty: 30 | Fill #0

## 2020-03-11 NOTE — Progress Notes (Signed)
BH MD/PA/NP OP Progress Note Virtual Visit via Video Note  I connected with Dana SAEPHANH on 03/11/20 at 11:40 AM EST by a video enabled telemedicine application and verified that I am speaking with the correct person using two identifiers.  Location: Patient: Home Provider: Clinic   I discussed the limitations of evaluation and management by telemedicine and the availability of in person appointments. The patient expressed understanding and agreed to proceed.  I provided 30 minutes of non-face-to-face time during this encounter.    03/11/2020 12:33 PM Dana Hughes  MRN:  [id number]  Chief Complaint:  "I'm stressed about my diabetes and helping people that live with me"  HPI: 39 year old female seen today for follow up psychiatric evaluation. She has a psychiatric history of depression, anxiety, and adjustment disorder.  She is currently managed on Mirtazapine 15 mg nightly, hydroxyzine 10 mg three times daily as needed, and Buspar 10 mg three times daily. She noted that her medications are somewhat effective in managing her psychiatric conditions.   Today she is well groomed, pleasant, cooperative, engaged in conversation and maintained eye contact. She informed provider that her anxiety and depression have worssened since starting the above medications. She noted that her symptoms are exacerbated by her health and helping her friend and her friends three children that live with her. Provider conducted a GAD 7 and patient scored a 20. Provider also conducted a PHQ 9 and patient scored a 21. She inforemd provider that Mirtazapine has been ineffective in helping her sleep and noted that she now sleeps about 1to 2 hours a night. She noted recently she has been having bad dreams about being shot, meeting Jesus, and her friend dying which she noted worsens her sleep. She denies HI however noted that at times she has passive SI. She reported that she would never hurt herself and noted than  when she has SI she listens to [place] to cope.   Patient continues to deal with multiple medical comorbities (endometriosis, ovarian cyst, diabetes, and hypertension). Due to her physical health she has had to leave work and noted that at times she is worried about her finances. Patient informed provider that she missed her morning appointment because her phone was turned off. She also noted that her car is not in the best condition. Provider recommended patient speaking to Care Management team to help with resources. She was greatful and noted that she would return on Monday to speak with the team. Provider also informed patient that her physical health can also interfere with her mental health. She endored understanding. She informed provider that she is frustrated because she is managed on insulin three times daily, attempts to exercise, and eat right however noted that it has not been effective in managing her diabetes. She also informed provider that she would like to have another child but noted that she can not due to endometriosis. She noted that her 72 year old son has autism and she wants him to speak up more for himself. She informed provider that she feels blessed that he is reaching other mild stones she was told that he wouldn't reach. She also informed provider that she struggle to help her younger son with his school work because he lacks motivation to complete it. She informed provider that she continue to speak to her daughter who was adopted and noted that she is doing well.    Patient informed provider that she is becoming more irritable. She informed provider that  she becomes agitated with her boyfriend and others who continue to take form her and not give back. She denies grandiosity, impulsiveness (noting she wants to steal at times to provide for her children but reports that she does not), or delusions. She however endorses VAH. She informed provider that she has been seeing a  human like figure next to her. She also noted that at times she believes people are talking but noted that when she asks loved ones they tell her that no one is talking. She informed provider on one occasion she felt that someone tapped her shoulder but noted no one was there.     Patient is agreeable to starting Abilify 5 mg to help manage symptoms of psychosis. She is also agreeable to start Trazodone 25-50 mg as needed for sleep. She will also increase Mirtazapine 15 mg to 30 mg to help manage depression and anxiety. She will continue all other medications as prescribes.Potential side effects of medication and risks vs benefits of treatment vs non-treatment were explained and discussed. All questions were answered. She will follow-up with outpatient counselor for therapy.  No other concerns noted at this time.  Visit Diagnosis:    ICD-10-CM   1. Severe episode of recurrent major depressive disorder, with psychotic features (HCC)  F33.3 ARIPiprazole (ABILIFY) 5 MG tablet    traZODone (DESYREL) 50 MG tablet  2. Generalized anxiety disorder  F41.1 busPIRone (BUSPAR) 10 MG tablet    hydrOXYzine (ATARAX/VISTARIL) 10 MG tablet    mirtazapine (REMERON) 30 MG tablet    Past Psychiatric History: depression, anxiety, and adjustment disorder.  Past Medical History:  Past Medical History:  Diagnosis Date  . Anxiety   . Arthritis   . Asthma   . BV (bacterial vaginosis)   . Candidiasis   . Depression   . Diabetes mellitus without complication ([place])   . Endometriosis   . Headache(784.0)    migraines  . Hypertension    no longer takes meds  . Migraines   . Muscle spasm   . Ovarian cyst   . Pelvic pain in female 09/29/2015  . Scoliosis   . Uterine fibroid     Past Surgical History:  Procedure Laterality Date  . CHOLECYSTECTOMY    . TUBAL LIGATION      Family Psychiatric History: Alcohol use disorder paternal and maternal family members, siblings anxiety,   Family History:  Family  History  Problem Relation Age of Onset  . Cancer Father   . Hypertension Maternal Aunt   . Breast cancer Maternal Aunt   . Cancer Paternal Grandmother   . Breast cancer Paternal Grandmother   . Breast cancer Paternal Aunt     Social History:  Social History   Socioeconomic History  . Marital status: Single    Spouse name: Not on file  . Number of children: Not on file  . Years of education: Not on file  . Highest education level: Not on file  Occupational History  . Not on file  Tobacco Use  . Smoking status: Former Research scientist (life sciences)  . Smokeless tobacco: Never Used  Vaping Use  . Vaping Use: Never used  Substance and Sexual Activity  . Alcohol use: Not Currently  . Drug use: Yes    Frequency: 1.0 times per week    Types: Marijuana  . Sexual activity: Yes    Birth control/protection: Condom, Surgical  Other Topics Concern  . Not on file  Social History Narrative  . Not on file  Social Determinants of Health   Financial Resource Strain:   . Difficulty of Paying Living Expenses: Not on file  Food Insecurity: Food Insecurity Present  . Worried About Charity fundraiser in the Last Year: Sometimes true  . Ran Out of Food in the Last Year: Sometimes true  Transportation Needs: No Transportation Needs  . Lack of Transportation (Medical): No  . Lack of Transportation (Non-Medical): No  Physical Activity:   . Days of Exercise per Week: Not on file  . Minutes of Exercise per Session: Not on file  Stress:   . Feeling of Stress : Not on file  Social Connections:   . Frequency of Communication with Friends and Family: Not on file  . Frequency of Social Gatherings with Friends and Family: Not on file  . Attends Religious Services: Not on file  . Active Member of Clubs or Organizations: Not on file  . Attends Archivist Meetings: Not on file  . Marital Status: Not on file    Allergies:  Allergies  Allergen Reactions  . Mushroom Extract Complex Anaphylaxis  . Latex  Itching and Swelling  . Penicillins Itching    Has patient had a PCN reaction causing immediate rash, facial/tongue/throat swelling, SOB or lightheadedness with hypotension: No Has patient had a PCN reaction causing severe rash involving mucus membranes or skin necrosis: No Has patient had a PCN reaction that required hospitalization No Has patient had a PCN reaction occurring within the last 10 years: No If all of the above answers are "NO", then may proceed with Cephalosporin use.     Metabolic Disorder Labs: Lab Results  Component Value Date   HGBA1C 11.3 (A) 02/19/2020   MPG 271.87 11/12/2019   No results found for: PROLACTIN No results found for: CHOL, TRIG, HDL, CHOLHDL, VLDL, LDLCALC Lab Results  Component Value Date   TSH 1.279 05/29/2017    Therapeutic Level Labs: No results found for: LITHIUM No results found for: VALPROATE No components found for:  CBMZ  Current Medications: Current Outpatient Medications  Medication Sig Dispense Refill  . albuterol (PROVENTIL HFA;VENTOLIN HFA) 108 (90 Base) MCG/ACT inhaler Inhale 2 puffs into the lungs every 6 (six) hours as needed for wheezing or shortness of breath. Reported on 09/29/2015    . albuterol (PROVENTIL) (2.5 MG/3ML) 0.083% nebulizer solution Take 3 mLs (2.5 mg total) by nebulization every 6 (six) hours as needed for wheezing or shortness of breath. 75 mL 0  . amLODipine (NORVASC) 5 MG tablet Take 1 tablet (5 mg total) by mouth daily. 90 tablet 1  . ARIPiprazole (ABILIFY) 5 MG tablet Take 1 tablet (5 mg total) by mouth daily. 30 tablet 2  . artificial tears (LACRILUBE) OINT ophthalmic ointment Place into both eyes at bedtime as needed for dry eyes. (Patient not taking: Reported on 01/14/2020) 3.5 g 0  . busPIRone (BUSPAR) 10 MG tablet Take 1 tablet (10 mg total) by mouth 3 (three) times daily. 90 tablet 2  . diclofenac Sodium (VOLTAREN) 1 % GEL Apply 2 g topically 4 (four) times daily. (Patient not taking: Reported on  01/14/2020) 2 g 1  . glucose blood test strip Check your sugar in the morning before you eat breakfast, and one hour after a meal. 100 each 2  . glucose monitoring kit (FREESTYLE) monitoring kit 1 each by Does not apply route daily. Check glucose once in the morning before breakfast and 1 hour after a meal 1 each 0  . hydrOXYzine (ATARAX/VISTARIL) 10  MG tablet Take 1 tablet (10 mg total) by mouth 3 (three) times daily as needed. 90 tablet 2  . ibuprofen (ADVIL) 600 MG tablet Take 1 tablet (600 mg total) by mouth every 6 (six) hours as needed. (Patient not taking: Reported on 01/14/2020) 30 tablet 0  . [START ON 03/22/2020] insulin aspart (NOVOLOG) 100 UNIT/ML FlexPen Inject 10 Units into the skin 2 (two) times daily with a meal. 6 mL 2  . [START ON 03/22/2020] insulin glargine (LANTUS SOLOSTAR) 100 UNIT/ML Solostar Pen Inject 20 Units into the skin daily at 10 pm. 6 mL 1  . Insulin Pen Needle (UNIFINE PEN NEEDLES) 32G X 4 MM MISC 1 application by Does not apply route in the morning, at noon, and at bedtime. 200 each 1  . meclizine (ANTIVERT) 25 MG tablet Take 1 tablet (25 mg total) by mouth 3 (three) times daily as needed for dizziness. (Patient not taking: Reported on 07/21/2018) 30 tablet 0  . medroxyPROGESTERone (PROVERA) 10 MG tablet Take 2 tablets (20 mg total) by mouth daily. (Patient not taking: Reported on 01/14/2020) 60 tablet 2  . metFORMIN (GLUCOPHAGE) 1000 MG tablet Take 1 tablet (1,000 mg total) by mouth 2 (two) times daily with a meal. (Patient not taking: Reported on 01/14/2020) 90 tablet 3  . methocarbamol (ROBAXIN) 500 MG tablet Take 1 tablet (500 mg total) by mouth 2 (two) times daily as needed for muscle spasms. (Patient taking differently: Take 500 mg by mouth 2 (two) times daily as needed for muscle spasms. PT states she is unable to get filled) 20 tablet 0  . mirtazapine (REMERON) 30 MG tablet Take 1 tablet (30 mg total) by mouth at bedtime. 30 tablet 2  . PE-DM-APAP & Doxylamin-DM-APAP  (VICKS DAYQUIL/NYQUIL CLD & FLU) (Liquid) MISC Take 30 mLs by mouth 2 (two) times daily as needed (cough). (Patient not taking: Reported on 01/14/2020)    . SUMAtriptan (IMITREX) 100 MG tablet Take 1 tablet (100 mg total) by mouth every 2 (two) hours as needed for migraine. May repeat in 2 hours if headache persists or recurs. No more than 2 in a 24 hour period (Patient not taking: Reported on 11/23/2019) 10 tablet 0  . traZODone (DESYREL) 50 MG tablet Take 1 tablet (50 mg total) by mouth at bedtime as needed for sleep. 30 tablet 2   Current Facility-Administered Medications  Medication Dose Route Frequency Provider Last Rate Last Admin  . medroxyPROGESTERone (DEPO-PROVERA) injection 150 mg  150 mg Intramuscular Q90 days Chancy Milroy, MD   150 mg at 10/29/19 1032     Musculoskeletal: Strength & Muscle Tone: Unable to assess due to telehealth visit Gait & Station: Unable to assess due to telehealth visit Patient leans: N/A  Psychiatric Specialty Exam: Review of Systems  There were no vitals taken for this visit.There is no height or weight on file to calculate BMI.  General Appearance: Well Groomed  Eye Contact:  Good  Speech:  Clear and Coherent and Normal Rate  Volume:  Normal  Mood:  Anxious, Depressed and Irritable  Affect:  Congruent  Thought Process:  Coherent, Goal Directed and Linear  Orientation:  Full (Time, Place, and Person)  Thought Content: Logical and Hallucinations: Auditory Tactile Visual   Suicidal Thoughts:  Yes.  without intent/plan  Homicidal Thoughts:  No  Memory:  Immediate;   Good Recent;   Good Remote;   Good  Judgement:  Good  Insight:  Good  Psychomotor Activity:  Normal  Concentration:  Concentration: Good and Attention Span: Good  Recall:  Good  Fund of Knowledge: Good  Language: Good  Akathisia:  No  Handed:  Right  AIMS (if indicated): Not done  Assets:  Communication Skills Desire for Improvement Financial  Resources/Insurance Housing Intimacy Social Support  ADL's:  Intact  Cognition: WNL  Sleep:  Poor   Screenings: GAD-7     Video Visit from 03/11/2020 in [facility] Office Visit from 02/19/2020 in [place] from 01/14/2020 in [facility] for [place] at [facility] Office Visit from 11/23/2019 in [place] [facility] [place] at [facility] Clinical Support from 10/29/2019 in [facility] for [place] at [facility]  Total GAD-7 Score 20 20 18 21 21     PHQ2-9     Video Visit from 03/11/2020 in [facility] Office Visit from 02/23/2020 in [place] Office Visit from 02/19/2020 in [place] from 01/14/2020 in [facility] for [place] at [facility] Nutrition from 12/08/2019 in [facility]  PHQ-2 Total Score 6 5 5 5  0  PHQ-9 Total Score 21 14 18 20  --       Assessment and Plan: Patient endorses symptoms of anxiety, depression, insomnia, and VAH. She is agreeable to starting Abilify 5 mg to help manage symptoms of psychosis. She is also agreeable to start Trazodone 25-50 mg as needed for sleep. She will also increase Mirtazapine 15 mg to 30 mg to help manage depression and anxiety. She will continue all other medications as prescribes  1. Generalized anxiety disorder  Continue- busPIRone (BUSPAR) 10 MG tablet; Take 1 tablet (10 mg total) by mouth 3 (three) times daily.  Dispense: 90 tablet; Refill: 2 Continue- hydrOXYzine (ATARAX/VISTARIL) 10 MG tablet; Take 1 tablet (10 mg total) by mouth 3 (three) times daily as needed.  Dispense: 90 tablet; Refill: 2 Increased- mirtazapine (REMERON) 30 MG tablet; Take 1 tablet (30 mg total) by mouth at bedtime.  Dispense: 30 tablet; Refill: 2  2. Severe episode of recurrent  major depressive disorder, with psychotic features ([place])  Start- ARIPiprazole (ABILIFY) 5 MG tablet; Take 1 tablet (5 mg total) by mouth daily.  Dispense: 30 tablet; Refill: 2 Start- traZODone (DESYREL) 50 MG tablet; Take 1 tablet (50 mg total) by mouth at bedtime as needed for sleep.  Dispense: 30 tablet; Refill: 2  Follow up in 3 months Follow up with therapy  Salley Slaughter, NP 03/11/2020, 12:33 PM

## 2020-03-24 ENCOUNTER — Encounter: Payer: Medicaid Other | Admitting: Internal Medicine

## 2020-03-28 ENCOUNTER — Encounter: Payer: Medicaid Other | Admitting: Internal Medicine

## 2020-03-30 ENCOUNTER — Encounter (HOSPITAL_COMMUNITY): Payer: No Payment, Other | Admitting: Psychiatry

## 2020-03-30 ENCOUNTER — Encounter: Payer: Medicaid Other | Admitting: Internal Medicine

## 2020-03-30 ENCOUNTER — Telehealth: Payer: Self-pay

## 2020-03-30 NOTE — Telephone Encounter (Signed)
Pt c/o heavy vaginal bleeding and has already reached out to her gyn office-states she is having to change pad every 10 min -pain 10/10 in lower back. Black stools (soft) x 3 days (color changing back to brown).   Will have pt seen this afternoon to address black stools, and instructed her to contact gyn office back regarding vaginal bleeding.

## 2020-03-30 NOTE — Telephone Encounter (Signed)
Pls contact pt regarding bleeding heavy [phone]

## 2020-03-31 ENCOUNTER — Encounter: Payer: Medicaid Other | Admitting: Internal Medicine

## 2020-03-31 ENCOUNTER — Ambulatory Visit: Payer: Medicaid Other

## 2020-04-04 ENCOUNTER — Other Ambulatory Visit: Payer: Self-pay

## 2020-04-04 ENCOUNTER — Encounter: Payer: Self-pay | Admitting: Student

## 2020-04-04 ENCOUNTER — Ambulatory Visit (INDEPENDENT_AMBULATORY_CARE_PROVIDER_SITE_OTHER): Payer: Medicaid Other

## 2020-04-04 VITALS — BP 125/95 | HR 75 | Wt 208.4 lb

## 2020-04-04 DIAGNOSIS — Z3042 Encounter for surveillance of injectable contraceptive: Secondary | ICD-10-CM | POA: Diagnosis not present

## 2020-04-04 NOTE — Progress Notes (Signed)
Patient presented to clinic today for evaluation of fatigue and melena. However, patient had scheduled Depo injection after this appointment and prefers to return to clinic another day so she can receive her shot. Patient re-scheduled for 04/05/20. This encounter was created in error - please disregard.

## 2020-04-04 NOTE — Progress Notes (Signed)
[name] here for Depo-Provera Injection. Reports continued vaginal bleeding since initial injection on 08/13/19; reports bleeding varies from spotting to heavy bleeding. Describes heavy bleeding as changing a saturated pad in less than 1 hour. Pt states she would not like to continue Depo-Provera. I explained pt will need to see Dr. Rip Harbour at scheduled appt 04/25/20 to discuss other options. Recommended pt continue Depo-Provera as she does see some improvement in bleeding shortly after injection. Injection administered without complication. Pt would like to discuss desired pregnancy at visit with Rip Harbour; also states she would like an [place] to reevaluate cyst, fibroids, and endometriosis.  Medications reviewed with pt; cannot recall all medication, reports some recent changes from behavioral health provider. PHQ-9 and GAD-7 positive today; no SI. Reports continued elevated BG readings and sprained finger, will follow up with internal medicine at appt regarding these issues. Encouraged pt to take a photo of all medications to review with provider at visit tomorrow. Food insecurity reported, food market grab and go bag provided today.  Annabell Howells, RN 04/04/2020  5:31 PM

## 2020-04-05 ENCOUNTER — Ambulatory Visit (INDEPENDENT_AMBULATORY_CARE_PROVIDER_SITE_OTHER): Payer: Self-pay | Admitting: Student

## 2020-04-05 VITALS — BP 136/91 | HR 100 | Temp 98.4°F | Ht 62.0 in | Wt 207.4 lb

## 2020-04-05 DIAGNOSIS — N939 Abnormal uterine and vaginal bleeding, unspecified: Secondary | ICD-10-CM

## 2020-04-05 DIAGNOSIS — E611 Iron deficiency: Secondary | ICD-10-CM

## 2020-04-05 DIAGNOSIS — K921 Melena: Secondary | ICD-10-CM

## 2020-04-05 NOTE — Patient Instructions (Addendum)
Ms. Nesbitt,  It was a pleasure seeing you today!  Today we talked about the pain you have been experiencing in your stomach and your black/bloody stools. I believe these might be related. I have placed a referral to get a colonoscopy, they will call you with the results. We are also checking some lab work today. I will call you tomorrow with the results. Please make sure to also follow-up with your OBGYN for your endometriosis and fibroids.  We look forward to seeing you next time. Please call our clinic at [phone] if you have any questions or concerns. The best time to call is Monday-Friday from 9am-4pm, but there is someone available 24/7 at the same number. If you need medication refills, please notify your pharmacy one week in advance and they will send [place] a request.  Thank you for letting us take part in your care. Wishing you the best!  Thank you, Dr. Sanjuan Dame, MD

## 2020-04-06 LAB — ANEMIA PROFILE B
Basophils Absolute: 0.1 10*3/uL (ref 0.0–0.2)
Basos: 1 %
EOS (ABSOLUTE): 0.1 10*3/uL (ref 0.0–0.4)
Eos: 1 %
Ferritin: 13 ng/mL — ABNORMAL LOW (ref 15–150)
Folate: 9.6 ng/mL (ref >3.0)
Hematocrit: 37 % (ref 34.0–46.6)
Hemoglobin: 11.9 g/dL (ref 11.1–15.9)
Immature Grans (Abs): 0 10*3/uL (ref 0.0–0.1)
Immature Granulocytes: 0 %
Iron Saturation: 18 % (ref 15–55)
Iron: 73 ug/dL (ref 27–159)
Lymphocytes Absolute: 3.9 10*3/uL — ABNORMAL HIGH (ref 0.7–3.1)
Lymphs: 38 %
MCH: 27.1 pg (ref 26.6–33.0)
MCHC: 32.2 g/dL (ref 31.5–35.7)
MCV: 84 fL (ref 79–97)
Monocytes Absolute: 0.5 10*3/uL (ref 0.1–0.9)
Monocytes: 5 %
Neutrophils Absolute: 5.7 10*3/uL (ref 1.4–7.0)
Neutrophils: 55 %
Platelets: 453 10*3/uL — ABNORMAL HIGH (ref 150–450)
RBC: 4.39 x10E6/uL (ref 3.77–5.28)
RDW: 13.9 % (ref 11.7–15.4)
Retic Ct Pct: 1.6 % (ref 0.6–2.6)
Total Iron Binding Capacity: 409 ug/dL (ref 250–450)
UIBC: 336 ug/dL (ref 131–425)
Vitamin B-12: 431 pg/mL (ref 232–1245)
WBC: 10.2 10*3/uL (ref 3.4–10.8)

## 2020-04-06 LAB — BMP8+ANION GAP
Anion Gap: 15 mmol/L (ref 10.0–18.0)
BUN/Creatinine Ratio: 5 — ABNORMAL LOW (ref 9–23)
BUN: 4 mg/dL — ABNORMAL LOW (ref 6–20)
CO2: 20 mmol/L (ref 20–29)
Calcium: 9.7 mg/dL (ref 8.7–10.2)
Chloride: 102 mmol/L (ref 96–106)
Creatinine, Ser: 0.83 mg/dL (ref 0.57–1.00)
GFR calc Af Amer: 103 mL/min/{1.73_m2} (ref >59)
GFR calc non Af Amer: 89 mL/min/{1.73_m2} (ref >59)
Glucose: 148 mg/dL — ABNORMAL HIGH (ref 65–99)
Potassium: 3.5 mmol/L (ref 3.5–5.2)
Sodium: 137 mmol/L (ref 134–144)

## 2020-04-07 MED ORDER — POLYSACCHARIDE IRON COMPLEX 150 MG PO TABS: 150.0000 mg | ORAL_TABLET | Freq: Every day | ORAL | 0 refills | Status: DC

## 2020-04-08 DIAGNOSIS — K921 Melena: Secondary | ICD-10-CM | POA: Insufficient documentation

## 2020-04-08 DIAGNOSIS — D509 Iron deficiency anemia, unspecified: Secondary | ICD-10-CM | POA: Insufficient documentation

## 2020-04-08 NOTE — Progress Notes (Signed)
Internal Medicine Clinic Attending  I saw and evaluated the patient.  I personally confirmed the key portions of the history and exam documented by Dr. Braswell and I reviewed pertinent patient test results.  The assessment, diagnosis, and plan were formulated together and I agree with the documentation in the resident's note.  

## 2020-04-08 NOTE — Assessment & Plan Note (Signed)
ADDENDUM: Lab results revealed low ferritin, most likely due to iron deficiency. Called patient twice to discuss results, but no answer. Will send in rx for iron supplements. Will defer towards polysaccharide iron complex as this has less GI side effects than ferrous sulfate. - Polysaccharide iron complex 150mg  daily - Re-check CBC, iron studies at follow-up appointment

## 2020-04-08 NOTE — Progress Notes (Signed)
   CC: abdominal pain, black stools  HPI:  [name] is a 39 y.o. with type II diabetes, endometriosis, uterine fibriods presenting to clinic with two week history of abdominal pain and black/bloody stools.  Past Medical History:  Diagnosis Date  . Anxiety   . Arthritis   . Asthma   . BV (bacterial vaginosis)   . Candidiasis   . Depression   . Diabetes mellitus without complication ([place])   . Endometriosis   . Headache(784.0)    migraines  . Hypertension    no longer takes meds  . Migraines   . Muscle spasm   . Ovarian cyst   . Pelvic pain in female 09/29/2015  . Scoliosis   . Uterine fibroid    Review of Systems:  As per HPI  Physical Exam:  Vitals:   04/05/20 1506  BP: (!) 136/91  Pulse: 100  Temp: 98.4 F (36.9 C)  TempSrc: Oral  SpO2: 100%  Weight: 207 lb 6.4 oz (94.1 kg)  Height: 5\' 2"  (1.575 m)   General: Pleasant, obese, no acute distress CV: Tachycardic, regular rhythm. No murmurs, rubs, gallops appreciated Pulm: Clear to auscultation bilaterally Abd: Soft, non-distended, mild epigastric, LUQ tenderness on palpation GU: No gross blood visible on anus. External hemorrhoid present. Rectal exam revealed no blood or masses.  Assessment & Plan:   See Encounters Tab for problem based charting.  Patient seen with Dr. Evette Doffing

## 2020-04-08 NOTE — Assessment & Plan Note (Addendum)
Patient presenting to clinic today for two weeks of intermittent melena and bright red blood with stools. Patient states she has had one episode of melena with multiple bouts of bright red blood with stools since late November. She notes she has never had this occur before. During this time, she says she has experienced fatigue, decreased appetite, and intermittent epigastric/LUQ abdominal pain. Reports there have been days she has no appetite and only feels like laying in the bed. Ms. Stice states the abdominal pain is not associated with meals. Patient reports some nausea, no vomiting. She has experienced both constipation and diarrhea during this time, which she says is chronic for her. Denies chest pain, palpitations, dyspnea, blurry vision, leg swelling. Of note, she mentions 30 pound weight loss over the last year and intermittent night sweats over the last month. She states her father passed away from colon cancer at the age of 68 and has strong history of colon cancer with both maternal and paternal family members. Patient also states she has heavy menstrual periods. She received her Depo shot yesterday and mentions her menstrual bleeding has decreased since then.  A/P: Patient has multiple concerning factors on presentation today, including melena, decreased appetite, weight loss, night sweats, and a strong family history of colon cancer. On exam, no gross blood visible on anus and rectal exam revealed no masses. Given her age of 54 and her father likely diagnosed in his late 46's, I think it would be appropriate for Gi referral for colonoscopy. Will check CBC, iron studies today. She has an appointment with her gynecologist in early January. Encouraged patient to make sure she attends that appointment - GI referral for colonoscopy - CBC, iron studies today - Return to clinic in two months for follow-up or sooner if needed - Follow-up with gynecologist in January

## 2020-04-25 ENCOUNTER — Ambulatory Visit: Payer: Medicaid Other | Admitting: Obstetrics and Gynecology

## 2020-05-02 ENCOUNTER — Encounter: Payer: Medicaid Other | Admitting: Student

## 2020-05-03 ENCOUNTER — Ambulatory Visit: Payer: Self-pay

## 2020-05-05 ENCOUNTER — Encounter: Payer: Self-pay | Admitting: Obstetrics and Gynecology

## 2020-05-05 ENCOUNTER — Other Ambulatory Visit: Payer: Self-pay

## 2020-05-05 ENCOUNTER — Ambulatory Visit (INDEPENDENT_AMBULATORY_CARE_PROVIDER_SITE_OTHER): Payer: Self-pay | Admitting: Obstetrics and Gynecology

## 2020-05-05 ENCOUNTER — Other Ambulatory Visit (HOSPITAL_COMMUNITY)
Admission: RE | Admit: 2020-05-05 | Discharge: 2020-05-05 | Disposition: A | Discharge status: Home / Self Care | Payer: Medicaid Other | Source: Ambulatory Visit | Attending: Obstetrics and Gynecology | Admitting: Obstetrics and Gynecology

## 2020-05-05 VITALS — BP 109/82 | HR 81 | Ht 63.0 in | Wt 200.6 lb

## 2020-05-05 DIAGNOSIS — Z113 Encounter for screening for infections with a predominantly sexual mode of transmission: Secondary | ICD-10-CM | POA: Insufficient documentation

## 2020-05-05 DIAGNOSIS — N939 Abnormal uterine and vaginal bleeding, unspecified: Secondary | ICD-10-CM

## 2020-05-05 DIAGNOSIS — Z9851 Tubal ligation status: Secondary | ICD-10-CM

## 2020-05-05 DIAGNOSIS — N809 Endometriosis, unspecified: Secondary | ICD-10-CM

## 2020-05-05 DIAGNOSIS — Z01419 Encounter for gynecological examination (general) (routine) without abnormal findings: Secondary | ICD-10-CM

## 2020-05-05 DIAGNOSIS — Z9189 Other specified personal risk factors, not elsewhere classified: Secondary | ICD-10-CM | POA: Insufficient documentation

## 2020-05-05 DIAGNOSIS — D219 Benign neoplasm of connective and other soft tissue, unspecified: Secondary | ICD-10-CM | POA: Insufficient documentation

## 2020-05-05 DIAGNOSIS — Z202 Contact with and (suspected) exposure to infections with a predominantly sexual mode of transmission: Secondary | ICD-10-CM | POA: Insufficient documentation

## 2020-05-05 LAB — CBC
Hematocrit: 38.3 % (ref 34.0–46.6)
Hemoglobin: 12 g/dL (ref 11.1–15.9)
MCH: 26.5 pg — ABNORMAL LOW (ref 26.6–33.0)
MCHC: 31.3 g/dL — ABNORMAL LOW (ref 31.5–35.7)
MCV: 85 fL (ref 79–97)
Platelets: 404 10*3/uL (ref 150–450)
RBC: 4.52 x10E6/uL (ref 3.77–5.28)
RDW: 13.7 % (ref 11.7–15.4)
WBC: 5.8 10*3/uL (ref 3.4–10.8)

## 2020-05-05 MED ORDER — MEGESTROL ACETATE 40 MG PO TABS: 40.0000 mg | ORAL_TABLET | Freq: Two times a day (BID) | ORAL | 5 refills | Status: DC

## 2020-05-05 NOTE — Patient Instructions (Addendum)
Health Maintenance, Female Adopting a healthy lifestyle and getting preventive care are important in promoting health and wellness. Ask your health care provider about:  The right schedule for you to have regular tests and exams.  Things you can do on your own to prevent diseases and keep yourself healthy. What should I know about diet, weight, and exercise? Eat a healthy diet  Eat a diet that includes plenty of vegetables, fruits, low-fat dairy products, and lean protein.  Do not eat a lot of foods that are high in solid fats, added sugars, or sodium.   Maintain a healthy weight Body mass index (BMI) is used to identify weight problems. It estimates body fat based on height and weight. Your health care provider can help determine your BMI and help you achieve or maintain a healthy weight. Get regular exercise Get regular exercise. This is one of the most important things you can do for your health. Most adults should:  Exercise for at least 150 minutes each week. The exercise should increase your heart rate and make you sweat (moderate-intensity exercise).  Do strengthening exercises at least twice a week. This is in addition to the moderate-intensity exercise.  Spend less time sitting. Even light physical activity can be beneficial. Watch cholesterol and blood lipids Have your blood tested for lipids and cholesterol at 40 years of age, then have this test every 5 years. Have your cholesterol levels checked more often if:  Your lipid or cholesterol levels are high.  You are older than 40 years of age.  You are at high risk for heart disease. What should I know about cancer screening? Depending on your health history and family history, you may need to have cancer screening at various ages. This may include screening for:  Breast cancer.  Cervical cancer.  Colorectal cancer.  Skin cancer.  Lung cancer. What should I know about heart disease, diabetes, and high blood  pressure? Blood pressure and heart disease  High blood pressure causes heart disease and increases the risk of stroke. This is more likely to develop in people who have high blood pressure readings, are of African descent, or are overweight.  Have your blood pressure checked: ? Every 3-5 years if you are 18-39 years of age. ? Every year if you are 40 years old or older. Diabetes Have regular diabetes screenings. This checks your fasting blood sugar level. Have the screening done:  Once every three years after age 40 if you are at a normal weight and have a low risk for diabetes.  More often and at a younger age if you are overweight or have a high risk for diabetes. What should I know about preventing infection? Hepatitis B If you have a higher risk for hepatitis B, you should be screened for this virus. Talk with your health care provider to find out if you are at risk for hepatitis B infection. Hepatitis C Testing is recommended for:  Everyone born from 1945 through 1965.  Anyone with known risk factors for hepatitis C. Sexually transmitted infections (STIs)  Get screened for STIs, including gonorrhea and chlamydia, if: ? You are sexually active and are younger than 40 years of age. ? You are older than 40 years of age and your health care provider tells you that you are at risk for this type of infection. ? Your sexual activity has changed since you were last screened, and you are at increased risk for chlamydia or gonorrhea. Ask your health care provider   if you are at risk.  Ask your health care provider about whether you are at high risk for HIV. Your health care provider may recommend a prescription medicine to help prevent HIV infection. If you choose to take medicine to prevent HIV, you should first get tested for HIV. You should then be tested every 3 months for as long as you are taking the medicine. Pregnancy  If you are about to stop having your period (premenopausal) and  you may become pregnant, seek counseling before you get pregnant.  Take 400 to 800 micrograms (mcg) of folic acid every day if you become pregnant.  Ask for birth control (contraception) if you want to prevent pregnancy. Osteoporosis and menopause Osteoporosis is a disease in which the bones lose minerals and strength with aging. This can result in bone fractures. If you are 65 years old or older, or if you are at risk for osteoporosis and fractures, ask your health care provider if you should:  Be screened for bone loss.  Take a calcium or vitamin D supplement to lower your risk of fractures.  Be given hormone replacement therapy (HRT) to treat symptoms of menopause. Follow these instructions at home: Lifestyle  Do not use any products that contain nicotine or tobacco, such as cigarettes, e-cigarettes, and chewing tobacco. If you need help quitting, ask your health care provider.  Do not use street drugs.  Do not share needles.  Ask your health care provider for help if you need support or information about quitting drugs. Alcohol use  Do not drink alcohol if: ? Your health care provider tells you not to drink. ? You are pregnant, may be pregnant, or are planning to become pregnant.  If you drink alcohol: ? Limit how much you use to 0-1 drink a day. ? Limit intake if you are breastfeeding.  Be aware of how much alcohol is in your drink. In the [place] one drink equals one 12 oz bottle of beer (355 mL), one 5 oz glass of wine (148 mL), or one 1 oz glass of hard liquor (44 mL). General instructions  Schedule regular health, dental, and eye exams.  Stay current with your vaccines.  Tell your health care provider if: ? You often feel depressed. ? You have ever been abused or do not feel safe at home. Summary  Adopting a healthy lifestyle and getting preventive care are important in promoting health and wellness.  Follow your health care provider's instructions about healthy  diet, exercising, and getting tested or screened for diseases.  Follow your health care provider's instructions on monitoring your cholesterol and blood pressure. This information is not intended to replace advice given to you by your health care provider. Make sure you discuss any questions you have with your health care provider. Document Revised: 04/02/2018 Document Reviewed: 04/02/2018 Elsevier Patient Education  2021 Elsevier Inc.  

## 2020-05-05 NOTE — Progress Notes (Signed)
Dana Hughes is a 40 y.o. [phone] female here for a routine annual gynecologic exam. Pt reports having abd pain for the last several months plus bloody stools. Has seen PCP and has appt with GI next month. Depo Last month, having some BTB. Still desires pregnancy.  U/S 2/21 two small uterine fibroids. H/O endometriosis    Gynecologic History No LMP recorded. Patient has had an injection. Contraception: tubal ligation Pap collected today Last mammogram: NA  Obstetric History OB History  Gravida Para Term Preterm AB Living  4 3 3  0 1 3  SAB IAB Ectopic Multiple Live Births  1 0 0 0 3    # Outcome Date GA Lbr Len/2nd Weight Sex Delivery Anes PTL Lv  4 Term 12/27/03    F Vag-Spont   LIV  3 Term 10/16/02    M Vag-Spont   LIV  2 Term 04/10/98    M Vag-Spont   LIV  1 SAB             Obstetric Comments  SVD x 3    Past Medical History:  Diagnosis Date  . Anxiety   . Arthritis   . Asthma   . BV (bacterial vaginosis)   . Candidiasis   . Depression   . Diabetes mellitus without complication ([place])   . Endometriosis   . Headache(784.0)    migraines  . Hypertension    no longer takes meds  . Migraines   . Muscle spasm   . Ovarian cyst   . Pelvic pain in female 09/29/2015  . Scoliosis   . Uterine fibroid     Past Surgical History:  Procedure Laterality Date  . CHOLECYSTECTOMY    . TUBAL LIGATION      Current Outpatient Medications on File Prior to Visit  Medication Sig Dispense Refill  . albuterol (PROVENTIL HFA;VENTOLIN HFA) 108 (90 Base) MCG/ACT inhaler Inhale 2 puffs into the lungs every 6 (six) hours as needed for wheezing or shortness of breath. Reported on 09/29/2015    . albuterol (PROVENTIL) (2.5 MG/3ML) 0.083% nebulizer solution Take 3 mLs (2.5 mg total) by nebulization every 6 (six) hours as needed for wheezing or shortness of breath. 75 mL 0  . amLODipine (NORVASC) 5 MG tablet Take 1 tablet (5 mg total) by mouth daily. 90 tablet 1  . ARIPiprazole (ABILIFY) 5 MG  tablet Take 1 tablet (5 mg total) by mouth daily. 30 tablet 2  . artificial tears (LACRILUBE) OINT ophthalmic ointment Place into both eyes at bedtime as needed for dry eyes. 3.5 g 0  . busPIRone (BUSPAR) 10 MG tablet Take 1 tablet (10 mg total) by mouth 3 (three) times daily. 90 tablet 2  . diclofenac Sodium (VOLTAREN) 1 % GEL Apply 2 g topically 4 (four) times daily. 2 g 1  . glucose blood test strip Check your sugar in the morning before you eat breakfast, and one hour after a meal. 100 each 2  . glucose monitoring kit (FREESTYLE) monitoring kit 1 each by Does not apply route daily. Check glucose once in the morning before breakfast and 1 hour after a meal 1 each 0  . hydrOXYzine (ATARAX/VISTARIL) 10 MG tablet Take 1 tablet (10 mg total) by mouth 3 (three) times daily as needed. 90 tablet 2  . insulin aspart (NOVOLOG) 100 UNIT/ML FlexPen Inject 10 Units into the skin 2 (two) times daily with a meal. 6 mL 2  . insulin glargine (LANTUS SOLOSTAR) 100 UNIT/ML Solostar Pen Inject 20  Units into the skin daily at 10 pm. 6 mL 1  . Insulin Pen Needle (UNIFINE PEN NEEDLES) 32G X 4 MM MISC 1 application by Does not apply route in the morning, at noon, and at bedtime. 200 each 1  . metFORMIN (GLUCOPHAGE) 1000 MG tablet Take 1 tablet (1,000 mg total) by mouth 2 (two) times daily with a meal. 90 tablet 3  . mirtazapine (REMERON) 30 MG tablet Take 1 tablet (30 mg total) by mouth at bedtime. 30 tablet 2  . Polysaccharide Iron Complex 150 MG TABS Take 150 mg by mouth daily. 30 tablet 0  . meclizine (ANTIVERT) 25 MG tablet Take 1 tablet (25 mg total) by mouth 3 (three) times daily as needed for dizziness. 30 tablet 0   Current Facility-Administered Medications on File Prior to Visit  Medication Dose Route Frequency Provider Last Rate Last Admin  . medroxyPROGESTERone (DEPO-PROVERA) injection 150 mg  150 mg Intramuscular Q90 days Chancy Milroy, MD   150 mg at 04/04/20 1615    Allergies  Allergen Reactions   . Mushroom Extract Complex Anaphylaxis  . Latex Itching and Swelling  . Penicillins Itching    Has patient had a PCN reaction causing immediate rash, facial/tongue/throat swelling, SOB or lightheadedness with hypotension: No Has patient had a PCN reaction causing severe rash involving mucus membranes or skin necrosis: No Has patient had a PCN reaction that required hospitalization No Has patient had a PCN reaction occurring within the last 10 years: No If all of the above answers are "NO", then may proceed with Cephalosporin use.     Social History   Socioeconomic History  . Marital status: Single    Spouse name: Not on file  . Number of children: Not on file  . Years of education: Not on file  . Highest education level: Not on file  Occupational History  . Not on file  Tobacco Use  . Smoking status: Former Research scientist (life sciences)  . Smokeless tobacco: Never Used  Vaping Use  . Vaping Use: Never used  Substance and Sexual Activity  . Alcohol use: Not Currently  . Drug use: Yes    Frequency: 1.0 times per week    Types: Marijuana  . Sexual activity: Yes    Birth control/protection: Condom, Surgical  Other Topics Concern  . Not on file  Social History Narrative  . Not on file   Social Determinants of Health   Financial Resource Strain: Not on file  Food Insecurity: Food Insecurity Present  . Worried About Charity fundraiser in the Last Year: Sometimes true  . Ran Out of Food in the Last Year: Often true  Transportation Needs: No Transportation Needs  . Lack of Transportation (Medical): No  . Lack of Transportation (Non-Medical): No  Physical Activity: Not on file  Stress: Not on file  Social Connections: Not on file  Intimate Partner Violence: Not on file    Family History  Problem Relation Age of Onset  . Cancer Father   . Hypertension Maternal Aunt   . Breast cancer Maternal Aunt   . Cancer Paternal Grandmother   . Breast cancer Paternal Grandmother   . Breast cancer  Paternal Aunt     The following portions of the patient's history were reviewed and updated as appropriate: allergies, current medications, past family history, past medical history, past social history, past surgical history and problem list.  Review of Systems Pertinent items are noted in HPI.   Objective:  BP 109/82  Pulse 81   Ht 5' 3"  (1.6 m)   Wt 200 lb 9.6 oz (91 kg)   BMI 35.53 kg/m  CONSTITUTIONAL: Well-developed, well-nourished female in no acute distress.  HENT:  Normocephalic, atraumatic, External right and left ear normal. Oropharynx is clear and moist EYES: Conjunctivae and EOM are normal. Pupils are equal, round, and reactive to light. No scleral icterus.  NECK: Normal range of motion, supple, no masses.  Normal thyroid.  SKIN: Skin is warm and dry. No rash noted. Not diaphoretic. No erythema. No pallor. [place]: Alert and oriented to person, place, and time. Normal reflexes, muscle tone coordination. No cranial nerve deficit noted. PSYCHIATRIC: Normal mood and affect. Normal behavior. Normal judgment and thought content. CARDIOVASCULAR: Normal heart rate noted, regular rhythm RESPIRATORY: Clear to auscultation bilaterally. Effort and breath sounds normal, no problems with respiration noted. BREASTS: Deferred per pt request ABDOMEN: Soft, normal bowel sounds, no distention noted. Upper quadrant tenderness  PELVIC: Normal appearing external genitalia; normal appearing vaginal mucosa and cervix.  Scant blood noted  Pap smear obtained.  Normal uterine size, no other palpable masses, no uterine or adnexal tenderness. MUSCULOSKELETAL: Normal range of motion. No tenderness.  No cyanosis, clubbing, or edema.  2+ distal pulses.   Assessment:  Annual gynecologic examination with pap smear STD exposure Uterine fibroids H/O endometriosis Melanic stool Plan:  Will follow up results of pap smear and manage accordingly. GI follow up as per PCP STD testing as per pt  request. Discussed treatment options with pt. I doubt pt's pain is related to her endometriosis as she has been pain free with the start of Depo and has not missed an injection. Pain has just started and now having melanic stool. Will check CBC and start Megace. Pt still desires to see REI. Encouraged to do so if pregnancy is desired.  Routine preventative health maintenance measures emphasized. F/U in 3 months Please refer to After Visit Summary for other counseling recommendations.    Chancy Milroy, MD, [place] Attending [place] for [facility], [place]

## 2020-05-06 ENCOUNTER — Encounter: Payer: Medicaid Other | Admitting: Internal Medicine

## 2020-05-06 LAB — HEPATITIS C ANTIBODY: Hep C Virus Ab: <0.1 s/co ratio (ref 0.0–0.9)

## 2020-05-06 LAB — HIV ANTIBODY (ROUTINE TESTING W REFLEX): HIV Screen 4th Generation wRfx: NONREACTIVE

## 2020-05-06 LAB — HEPATITIS B SURFACE ANTIGEN: Hepatitis B Surface Ag: NEGATIVE

## 2020-05-06 LAB — RPR: RPR Ser Ql: NONREACTIVE

## 2020-05-09 LAB — CYTOLOGY - PAP
Chlamydia: NEGATIVE
Comment: NEGATIVE
Comment: NEGATIVE
Comment: NEGATIVE
Comment: NORMAL
Diagnosis: NEGATIVE
High risk HPV: NEGATIVE
Neisseria Gonorrhea: NEGATIVE
Trichomonas: NEGATIVE

## 2020-05-12 ENCOUNTER — Encounter: Payer: Medicaid Other | Admitting: Internal Medicine

## 2020-05-12 ENCOUNTER — Encounter: Payer: Self-pay | Admitting: Internal Medicine

## 2020-05-13 NOTE — Telephone Encounter (Signed)
Call placed to patient to discuss message she sent to [facility] when r/s her appt regarding pain. No answer and no VM set up. Will reply to patient via My Chart. Hubbard Hartshorn, BSN, RN-BC

## 2020-05-19 ENCOUNTER — Encounter: Payer: Medicaid Other | Admitting: Internal Medicine

## 2020-05-25 ENCOUNTER — Other Ambulatory Visit: Payer: Self-pay

## 2020-05-25 ENCOUNTER — Ambulatory Visit (INDEPENDENT_AMBULATORY_CARE_PROVIDER_SITE_OTHER): Payer: Medicaid Other | Admitting: Internal Medicine

## 2020-05-25 VITALS — BP 109/86 | HR 90 | Temp 98.3°F | Wt 200.7 lb

## 2020-05-25 DIAGNOSIS — R1013 Epigastric pain: Secondary | ICD-10-CM

## 2020-05-25 DIAGNOSIS — E611 Iron deficiency: Secondary | ICD-10-CM

## 2020-05-25 DIAGNOSIS — R300 Dysuria: Secondary | ICD-10-CM

## 2020-05-25 DIAGNOSIS — E119 Type 2 diabetes mellitus without complications: Secondary | ICD-10-CM

## 2020-05-25 LAB — GLUCOSE, CAPILLARY: Glucose-Capillary: 319 mg/dL — ABNORMAL HIGH (ref 70–99)

## 2020-05-25 LAB — POCT URINALYSIS DIPSTICK
Bilirubin, UA: NEGATIVE
Glucose, UA: POSITIVE — AB
Leukocytes, UA: NEGATIVE
Nitrite, UA: NEGATIVE
Protein, UA: POSITIVE — AB
Spec Grav, UA: >=1.03 — AB (ref 1.010–1.025)
Urobilinogen, UA: 0.2 E.U./dL
pH, UA: 5.5 (ref 5.0–8.0)

## 2020-05-25 LAB — POCT GLYCOSYLATED HEMOGLOBIN (HGB A1C): Hemoglobin A1C: 10.8 % — AB (ref 4.0–5.6)

## 2020-05-25 MED ORDER — INSULIN ASPART 100 UNIT/ML FLEXPEN: 10.0000 [IU] | PEN_INJECTOR | Freq: Two times a day (BID) | SUBCUTANEOUS | 2 refills | Status: DC

## 2020-05-25 MED ORDER — LANTUS SOLOSTAR 100 UNIT/ML ~~LOC~~ SOPN: 20.0000 [IU] | PEN_INJECTOR | Freq: Every day | SUBCUTANEOUS | 3 refills | Status: DC

## 2020-05-25 MED ORDER — OMEPRAZOLE 20 MG PO CPDR: 20.0000 mg | DELAYED_RELEASE_CAPSULE | Freq: Every day | ORAL | 2 refills | Status: DC

## 2020-05-25 NOTE — Patient Instructions (Addendum)
It was nice seeing you today! Thank you for choosing [facility] for your Primary Care.    Today we talked about:   1. Stomach Pain:  a. Today we talked about your stomach pain.  We have ordered an Complete Abdominal Ultrasound just to make sure there is not involvement of your liver, former location of your gallbladder or spleen.  I do not suspect this will give [place] the answers we are looking for though.  It will be very important that you keep your appointment with the GI doctors on February 22.   b. I am going to check some blood work to evaluate for inflammatory bowel disease.  c. To start working on controlling the pain, I will start you on a medication called Omeprazole/Prilosec to help reduce the acid in your stomach.  Take 1 tablet daily before your breakfast.   d. At night when you the pain is the most severe, you can try taking Maalox, which you can buy over-the-counter.   e. It will be very important for you to stop taking Ibuprofen or Aleve as it may be worsening your stomach pain.  2. Back pain:  a. I would recommend Lidocaine patches for the pain. You can buy these over the counter at any pharmacy.  b. We will check your kidneys with the ultrasound as well, to see if there are any kidney stones still present.   3. I recommend you start taking daily iron tablets as long as you have continued bleeding. For the least side effects, I recommend Polysaccharide Iron supplementation.   4. Diabetes: I have refilled your Insulin.   Let's follow up after your ultrasound or in the next 2-3 weeks.

## 2020-05-25 NOTE — Progress Notes (Incomplete)
   CC: Lower back pain and stomach pain  HPI:  [name] is a 40 y.o. with a PMHx as listed below who presents to the clinic for lower back pain and stomach pain.   Please see the Encounters tab for problem-based Assessment & Plan regarding status of patient's acute and chronic conditions.  Past Medical History:  Diagnosis Date  . Anxiety   . Arthritis   . Asthma   . BV (bacterial vaginosis)   . Candidiasis   . Depression   . Diabetes mellitus without complication ([place])   . Endometriosis   . Headache(784.0)    migraines  . Hypertension    no longer takes meds  . Migraines   . Muscle spasm   . Ovarian cyst   . Pelvic pain in female 09/29/2015  . Scoliosis   . Uterine fibroid    Review of Systems: Review of Systems  Constitutional: Negative for chills, fever, malaise/fatigue and weight loss.  Respiratory: Negative for cough and shortness of breath.   Gastrointestinal: Positive for abdominal pain, blood in stool, constipation, diarrhea, heartburn, melena and nausea. Negative for vomiting.  Genitourinary: Positive for dysuria. Negative for frequency, hematuria and urgency.  Musculoskeletal: Positive for back pain. Negative for myalgias.  Neurological: Negative for focal weakness and weakness.   Physical Exam:  Vitals:   05/25/20 1406  BP: 109/86  Pulse: 90  Temp: 98.3 F (36.8 C)  TempSrc: Oral  SpO2: 100%  Weight: 200 lb 11.2 oz (91 kg)   Physical Exam Vitals and nursing note reviewed.  Constitutional:      General: She is not in acute distress.    Appearance: She is obese.  HENT:     Head: Normocephalic and atraumatic.  Pulmonary:     Effort: Pulmonary effort is normal. No respiratory distress.  Abdominal:     General: Bowel sounds are normal. There is no distension.     Palpations: Abdomen is soft. There is no mass.     Tenderness: There is no abdominal tenderness. There is left CVA tenderness. There is no right CVA tenderness, guarding or rebound.   Skin:    General: Skin is warm and dry.  Neurological:     General: No focal deficit present.     Mental Status: She is alert and oriented to person, place, and time.  Psychiatric:        Mood and Affect: Mood normal.        Behavior: Behavior normal.    Assessment & Plan:   See Encounters Tab for problem based charting.  Patient discussed with Dr. Jimmye Norman

## 2020-05-26 LAB — C-REACTIVE PROTEIN: CRP: 4 mg/L (ref 0–10)

## 2020-05-26 LAB — SEDIMENTATION RATE: Sed Rate: 17 mm/hr (ref 0–32)

## 2020-05-27 ENCOUNTER — Telehealth: Payer: Self-pay | Admitting: *Deleted

## 2020-05-27 ENCOUNTER — Other Ambulatory Visit: Payer: Self-pay | Admitting: *Deleted

## 2020-05-27 DIAGNOSIS — R1013 Epigastric pain: Secondary | ICD-10-CM | POA: Insufficient documentation

## 2020-05-27 DIAGNOSIS — E119 Type 2 diabetes mellitus without complications: Secondary | ICD-10-CM

## 2020-05-27 DIAGNOSIS — R300 Dysuria: Secondary | ICD-10-CM | POA: Insufficient documentation

## 2020-05-27 NOTE — Telephone Encounter (Signed)
Call to [employer] spoke to representative about PA for Omeprazole, Lantus and Novolog.  Patient has [facility] which does not cover her meds.   [id number].  Patient was called and asked if she has another insurance that will cover her meds.  Patient does not.  Uses [facility]. For meds  Will check with Pharmacist to see if patient has other options.  Sander Nephew, RN 05/27/2020 12:00 PM.

## 2020-05-27 NOTE — Assessment & Plan Note (Signed)
Dana Hughes states that a few days ago, she had some back pain that was on her left side that migrated around to the front and subsequently developed some dysuria.  She believes she did urinate a small kidney stone.  She denies ever having a kidney stone before.  Assessment/plan: On examination today, patient continues to have left CVA tenderness although she is not experiencing any dysuria currently.  Her UA dipstick does show evidence of hematuria consistent with possible passing of recent kidney stone.  Given that she continues to have CVA tenderness today, will pursue renal ultrasound.  If negative, continue CT renal stone study.  -Renal ultrasound ordered as part of complete abdominal ultrasound -Follow-up following results

## 2020-05-27 NOTE — Assessment & Plan Note (Addendum)
Dana Hughes describes a several year history of epigastric abdominal pain that she states as she gets older, the pain becomes more severe.  In the last month, the pain has become unbearable.  She notes that the pain is most frequent after eating and at nighttime.  The pain does radiate from the epigastric and left upper quadrant to the right upper quadrant.  It does not radiate into the lower quadrants.    She has not found any alleviating factors, including ibuprofen, Aleve, Tylenol.  She is using ibuprofen and Aleve quite frequently to try and relieve the pain though.  She notes a history of endometriosis but was told by her OB/GYN this pain is unlikely to be related to her endometriosis.  She is concerned that this may be an aspect of her gallbladder, stating that she was told in 2015 her gallbladder staples have migrated.  She has not had that further looked into though.    She denies any fever, chills.  She recently did visit with her PCP in December at which time she was having bright red blood per rectum and melena in addition to bowel movement changes such as alternating between diarrhea and constipation.  At the time she was referred to GI and has an upcoming appointment on 22 February.  That her melena has improved and she has not noticed any for at least a few weeks now.  Assessment/plan: Given the epigastric nature of pain with recent NSAID use, I am concerned there is an aspect of the gastritis versus peptic ulcer disease.  Given this, I will start her empirically on omeprazole 20 mg daily before breakfast.  I did explain that she will need to follow-up with the GI doctors as she may require a EGD for further evaluation.  She expressed understanding.  I recommended Maalox for acute symptomatic treatment.    Differential also includes inflammatory bowel disease especially in light of her recent history of developing melena hematochezia.  Given location of pain, would be most consistent with  Crohn's.  ESR and CRP were obtained and negative.  Dana Hughes is requesting an abdominal ultrasound to evaluate the placement of her gallbladder clips and to make sure there is no involvement of the liver or spleen.  I explained that this imaging will be unlikely to yield any results that may explain her abdominal pain, but given its low risk we will go ahead and order.  Additionally, unlikely related to endometriosis as it does not seem to have any relation to her menstrual cycles.  -Complete abdominal ultrasound ordered -Omeprazole 20 mg daily before breakfast -Maalox as needed for acute pain -Avoid any NSAIDs -Follow-up with GI on 22 February -Follow-up with our clinic in 2-3 weeks

## 2020-05-27 NOTE — Assessment & Plan Note (Signed)
Patient notes she is been unable to tolerate ferrous sulfate iron supplementation.  I recommended she try polysaccharide iron complex which was previously sent in by PCP to the pharmacy.

## 2020-05-27 NOTE — Assessment & Plan Note (Addendum)
Dana Hughes states that she has not been taking her insulin as she ran out.  She is requesting a refill today.  She is not having any problems with her insulin prior to running out.  Assessment/plan: Lab Results  Component Value Date   HGBA1C 10.8 (A) 05/25/2020   A1c elevated today but improved slightly compared to last visit.  We will refill her Lantus and NovoLog.  -Continue Metformin -Refilled Lantus 20 units at bedtime -Refilled NovoLog 10 units twice a day with meals -Follow-up with PCP for further titration

## 2020-05-31 NOTE — Progress Notes (Signed)
Internal Medicine Clinic Attending  Case discussed with Dr. Basaraba  At the time of the visit.  We reviewed the resident's history and exam and pertinent patient test results.  I agree with the assessment, diagnosis, and plan of care documented in the resident's note.  

## 2020-06-02 NOTE — Telephone Encounter (Signed)
Patient called in requesting Rx for lantus and novolog be sent to [place] as it is hundreds of dollars at [employer]. Hubbard Hartshorn, BSN, RN-BC

## 2020-06-03 ENCOUNTER — Encounter: Payer: Self-pay | Admitting: Internal Medicine

## 2020-06-03 ENCOUNTER — Ambulatory Visit (INDEPENDENT_AMBULATORY_CARE_PROVIDER_SITE_OTHER): Payer: Self-pay | Admitting: Internal Medicine

## 2020-06-03 ENCOUNTER — Telehealth (INDEPENDENT_AMBULATORY_CARE_PROVIDER_SITE_OTHER): Payer: No Payment, Other | Admitting: Psychiatry

## 2020-06-03 ENCOUNTER — Other Ambulatory Visit: Payer: Self-pay | Admitting: Internal Medicine

## 2020-06-03 ENCOUNTER — Encounter (HOSPITAL_COMMUNITY): Payer: Self-pay | Admitting: Psychiatry

## 2020-06-03 ENCOUNTER — Other Ambulatory Visit: Payer: Self-pay | Admitting: Psychiatry

## 2020-06-03 ENCOUNTER — Other Ambulatory Visit: Payer: Self-pay

## 2020-06-03 DIAGNOSIS — R1013 Epigastric pain: Secondary | ICD-10-CM

## 2020-06-03 DIAGNOSIS — E119 Type 2 diabetes mellitus without complications: Secondary | ICD-10-CM

## 2020-06-03 DIAGNOSIS — F411 Generalized anxiety disorder: Secondary | ICD-10-CM | POA: Diagnosis not present

## 2020-06-03 DIAGNOSIS — F333 Major depressive disorder, recurrent, severe with psychotic symptoms: Secondary | ICD-10-CM | POA: Diagnosis not present

## 2020-06-03 MED ORDER — BUSPIRONE HCL 10 MG PO TABS
10.0000 mg | ORAL_TABLET | Freq: Three times a day (TID) | ORAL | 2 refills | Status: DC
Start: 2020-06-03 — End: 2020-10-11

## 2020-06-03 MED ORDER — INSULIN ASPART 100 UNIT/ML FLEXPEN: 10.0000 [IU] | PEN_INJECTOR | Freq: Two times a day (BID) | SUBCUTANEOUS | 2 refills | Status: DC

## 2020-06-03 MED ORDER — LANTUS SOLOSTAR 100 UNIT/ML ~~LOC~~ SOPN: 20.0000 [IU] | PEN_INJECTOR | Freq: Every day | SUBCUTANEOUS | 3 refills | Status: DC

## 2020-06-03 MED ORDER — HYDROXYZINE HCL 10 MG PO TABS: 10.0000 mg | ORAL_TABLET | Freq: Three times a day (TID) | ORAL | 2 refills | Status: DC | PRN

## 2020-06-03 MED ORDER — ARIPIPRAZOLE 5 MG PO TABS: 5.0000 mg | ORAL_TABLET | Freq: Every day | ORAL | 2 refills | Status: DC

## 2020-06-03 MED ORDER — MIRTAZAPINE 30 MG PO TABS: 30.0000 mg | ORAL_TABLET | Freq: Every day | ORAL | 2 refills | Status: DC

## 2020-06-03 MED FILL — !LANTUS SOLOSTAR 100UNITS/M: 100 | 30 days supply | Qty: 6 | Fill #0

## 2020-06-03 MED FILL — !NOVOLOG FLEXPEN SYRINGE 1: 100/ML | 30 days supply | Qty: 6 | Fill #0

## 2020-06-03 MED FILL — ?ARIPRAZOLE 5 MG TABS: 5 | 30 days supply | Qty: 30 | Fill #0

## 2020-06-03 MED FILL — hydrOXYzine HCL 10 MG TABS: 10 | 30 days supply | Qty: 90 | Fill #0

## 2020-06-03 MED FILL — ?BUSPIRONE HCL 10 MG TABLET: 10 | 30 days supply | Qty: 90 | Fill #0

## 2020-06-03 MED FILL — MIRTAZAPINE 30 MG TABLET: 30 | 30 days supply | Qty: 30 | Fill #0

## 2020-06-03 NOTE — Assessment & Plan Note (Addendum)
(  Televisit) Patient reports large bowel movement 2 yesterday that was associated with abdominal and low back pain. She also says that she passed large blood clots. However, she is not sure if the blood clots came from her vagina or rectum.  (she has Hx of AUB and is on Megace and follows with OBGYN). She had some brief dizziness yesterday but it resolved today.  She denies nausea vomiting but states that her appetite has been low and she did not eat for past 3 days. (Of note, she had a recent history of melena/rectal bleeding and, decreased appetite, weight loss and has family history of colon cancer. Recent CBC showed stable hemoglobin at 12.). She states that she has had so much vaginal bleeding but her OBGYN told her to discuss it with her PCP.  She sounds comfortable over the phone however she says that her pain is 10 out of 10.  She was seen in clinic recently for epigastric pain and was started on omeprazole and referred to GI for colonoscopy and further evaluation.   Patient is not clear and inconsistent about her symptoms to assess severity and acuity of her symptoms. Her concern is about the large brown bowel movement she had yesterday and also having hemorrhoid. She then endorses her abdominal pain is worse and the bleeding is worse.  -Given continuous bleeding (GI/vaginal), and abdominal pain, recommended to be evaluated in person to check her vital sign, perform abdominal exam and likely check CBC -Recommended to go to urgent care. She verbalizes undrestanding -Follow-up with GI on February 22. -Recommended to follow with OBGYN as well  -As always, pt is advised that if symptoms worsen or new symptoms arise, they should go to an urgent care facility or to to ER for further evaluation.

## 2020-06-03 NOTE — Telephone Encounter (Signed)
Talked to the patient for the televisit. Thanks

## 2020-06-03 NOTE — Telephone Encounter (Signed)
Prescription has been sent.

## 2020-06-03 NOTE — Progress Notes (Signed)
  [facility] Internal Medicine Residency Telephone Encounter Continuity Care Appointment  HPI:   This telephone encounter was created for Dana Hughes on 06/03/2020 for the following purpose/cc abdominal pain, large bowel movement, decreased appetite and passing blood clot. Please refer to problem based charting for further details ans assessment and plan.   PMHx: HTN, DM2, AUB, endometriosis, GAD, MDD  Medications: Albuterol, amlodipine, Abilify, BuSpar, Voltaren gel, NovoLog, Lantus, hydroxyzine, Megace,Metformin, omeprazole   Past Medical History:  Past Medical History:  Diagnosis Date  . Anxiety   . Arthritis   . Asthma   . BV (bacterial vaginosis)   . Candidiasis   . Depression   . Diabetes mellitus without complication ([place])   . Endometriosis   . Headache(784.0)    migraines  . Hypertension    no longer takes meds  . Migraines   . Muscle spasm   . Ovarian cyst   . Pelvic pain in female 09/29/2015  . Scoliosis   . Uterine fibroid      ROS:   Negative except as mentioned in HPI and assessment and plan.   Assessment / Plan / Recommendations:  Please see A&P under problem oriented charting for assessment of the patient's acute and chronic medical conditions.   Consent and Medical Decision Making:   Patient discussed with Dr. Daryll Drown  This is a telephone encounter between Dana Hughes and [place] on 06/03/2020 for abdominal pain. The visit was conducted with the patient located at home and [facility]. The patient's identity was confirmed using their DOB and current address. The patient has consented to being evaluated through a telephone encounter and understands the associated risks (an examination cannot be done and the patient may need to come in for an appointment) / benefits (allows the patient to remain at home, decreasing exposure to coronavirus). I personally spent 15 minutes on medical discussion.

## 2020-06-03 NOTE — Telephone Encounter (Signed)
Called pt x 2 - she answered on second call. Stated she's having mid/lower back pain. She had a large BM last night and this morning with pain. Pt agreed to schedule a telehealth appt ; instructed to keep her phone with her. Dr Myrtie Hawk informed.

## 2020-06-03 NOTE — Progress Notes (Signed)
BH MD/PA/NP OP Progress Note Virtual Visit via Video Note  I connected with Dana Hughes on 06/03/20 at 11:00 AM EST by a video enabled telemedicine application and verified that I am speaking with the correct person using two identifiers.  Location: Patient: Home Provider: Clinic   I discussed the limitations of evaluation and management by telemedicine and the availability of in person appointments. The patient expressed understanding and agreed to proceed.  I provided 30 minutes of non-face-to-face time during this encounter.    06/03/2020 1:20 PM Dana Hughes  MRN:  [id number]  Chief Complaint:  "I haven't had a chance to pick up my medications"  HPI: 40 year old female seen today for follow up psychiatric evaluation. She has a psychiatric history of depression, anxiety, and adjustment disorder.  She is currently managed on Mirtazapine 30 mg nightly, Trazodone 25 mg-50 mg nightly as needed, Abilify 5 mg daily, hydroxyzine 10 mg three times daily as needed, and Buspar 10 mg three times daily. She noted that she was unable to pick up her medications and is feeling anxious, irritable, and depressed.   Today she is well groomed, pleasant, cooperative, engaged in conversation and maintained eye contact. She informed provider that her anxiety and depression have worsened since her last visit. She notes that she was unable to pick up her medications because she could not afford them. She endorses increased irritability and distractibility. She denies other symptoms of mania. Today provider conducted a GAD 7 and patient scored a 20, at her last visit she scored a 19. Provider also conducted a PHQ 9 and patient scored a 19. At her last visit she scored a 21. She denies SI/HI/VAH or paranoia. She notes that her sleep continues to be poor and reports that her appetite fluctuates.    Patient notes that she continues to deal with multiple medical comorbities (endometriosis, ovarian cyst,  diabetes, and hypertension). She notes that these medical conditions cause her great pain in her back. She notes that recently passed a kidney stone which was painful. She informed provider that she has been following up with her PCP to manage these conditions. Patient notes that she continues to worry about finances. She notes that she did not follow up with [facility] Care Management team to help with resources due to her physical and mental health being poor.  To cope with the above stressors patient notes that she walks, listens to [employer], and sits on her balcony. Today she notes that she would like to restart her medications. Provider suggested that patient call [place] about receiving patient care assistance. She endorsed understanding and agreed. At this time trazodone will not be refilled. Patient will use Mirtazapine to manage her sleep, anxiety and depression. She will continue all other medications as prescribed. She will follow-up with outpatient counselor for therapy.  No other concerns noted at this time.  Visit Diagnosis:    ICD-10-CM   1. Generalized anxiety disorder  F41.1 mirtazapine (REMERON) 30 MG tablet    hydrOXYzine (ATARAX/VISTARIL) 10 MG tablet    busPIRone (BUSPAR) 10 MG tablet  2. Severe episode of recurrent major depressive disorder, with psychotic features ([place])  F33.3 ARIPiprazole (ABILIFY) 5 MG tablet    Past Psychiatric History: depression, anxiety, and adjustment disorder.  Past Medical History:  Past Medical History:  Diagnosis Date  . Anxiety   . Arthritis   . Asthma   . BV (bacterial vaginosis)   . Candidiasis   . Depression   .  Diabetes mellitus without complication ([place])   . Endometriosis   . Headache(784.0)    migraines  . Hypertension    no longer takes meds  . Migraines   . Muscle spasm   . Ovarian cyst   . Pelvic pain in female 09/29/2015  . Scoliosis   . Uterine fibroid     Past Surgical History:  Procedure  Laterality Date  . CHOLECYSTECTOMY    . TUBAL LIGATION      Family Psychiatric History: Alcohol use disorder paternal and maternal family members, siblings anxiety,   Family History:  Family History  Problem Relation Age of Onset  . Cancer Father   . Hypertension Maternal Aunt   . Breast cancer Maternal Aunt   . Cancer Paternal Grandmother   . Breast cancer Paternal Grandmother   . Breast cancer Paternal Aunt     Social History:  Social History   Socioeconomic History  . Marital status: Single    Spouse name: Not on file  . Number of children: Not on file  . Years of education: Not on file  . Highest education level: Not on file  Occupational History  . Not on file  Tobacco Use  . Smoking status: Former Research scientist (life sciences)  . Smokeless tobacco: Never Used  Vaping Use  . Vaping Use: Never used  Substance and Sexual Activity  . Alcohol use: Not Currently  . Drug use: Yes    Frequency: 1.0 times per week    Types: Marijuana  . Sexual activity: Yes    Birth control/protection: Condom, Surgical  Other Topics Concern  . Not on file  Social History Narrative  . Not on file   Social Determinants of Health   Financial Resource Strain: Not on file  Food Insecurity: Food Insecurity Present  . Worried About Charity fundraiser in the Last Year: Sometimes true  . Ran Out of Food in the Last Year: Often true  Transportation Needs: No Transportation Needs  . Lack of Transportation (Medical): No  . Lack of Transportation (Non-Medical): No  Physical Activity: Not on file  Stress: Not on file  Social Connections: Not on file    Allergies:  Allergies  Allergen Reactions  . Mushroom Extract Complex Anaphylaxis  . Latex Itching and Swelling  . Penicillins Itching    Has patient had a PCN reaction causing immediate rash, facial/tongue/throat swelling, SOB or lightheadedness with hypotension: No Has patient had a PCN reaction causing severe rash involving mucus membranes or skin  necrosis: No Has patient had a PCN reaction that required hospitalization No Has patient had a PCN reaction occurring within the last 10 years: No If all of the above answers are "NO", then may proceed with Cephalosporin use.     Metabolic Disorder Labs: Lab Results  Component Value Date   HGBA1C 10.8 (A) 05/25/2020   MPG 271.87 11/12/2019   No results found for: PROLACTIN No results found for: CHOL, TRIG, HDL, CHOLHDL, VLDL, LDLCALC Lab Results  Component Value Date   TSH 1.279 05/29/2017    Therapeutic Level Labs: No results found for: LITHIUM No results found for: VALPROATE No components found for:  CBMZ  Current Medications: Current Outpatient Medications  Medication Sig Dispense Refill  . albuterol (PROVENTIL HFA;VENTOLIN HFA) 108 (90 Base) MCG/ACT inhaler Inhale 2 puffs into the lungs every 6 (six) hours as needed for wheezing or shortness of breath. Reported on 09/29/2015    . albuterol (PROVENTIL) (2.5 MG/3ML) 0.083% nebulizer solution Take 3 mLs (2.5  mg total) by nebulization every 6 (six) hours as needed for wheezing or shortness of breath. 75 mL 0  . amLODipine (NORVASC) 5 MG tablet Take 1 tablet (5 mg total) by mouth daily. 90 tablet 1  . ARIPiprazole (ABILIFY) 5 MG tablet Take 1 tablet (5 mg total) by mouth daily. 30 tablet 2  . artificial tears (LACRILUBE) OINT ophthalmic ointment Place into both eyes at bedtime as needed for dry eyes. 3.5 g 0  . busPIRone (BUSPAR) 10 MG tablet Take 1 tablet (10 mg total) by mouth 3 (three) times daily. 90 tablet 2  . diclofenac Sodium (VOLTAREN) 1 % GEL Apply 2 g topically 4 (four) times daily. 2 g 1  . glucose blood test strip Check your sugar in the morning before you eat breakfast, and one hour after a meal. 100 each 2  . glucose monitoring kit (FREESTYLE) monitoring kit 1 each by Does not apply route daily. Check glucose once in the morning before breakfast and 1 hour after a meal 1 each 0  . hydrOXYzine (ATARAX/VISTARIL) 10 MG  tablet Take 1 tablet (10 mg total) by mouth 3 (three) times daily as needed. 90 tablet 2  . insulin aspart (NOVOLOG) 100 UNIT/ML FlexPen Inject 10 Units into the skin 2 (two) times daily with a meal. 6 mL 2  . insulin glargine (LANTUS SOLOSTAR) 100 UNIT/ML Solostar Pen Inject 20 Units into the skin daily at 10 pm. 6 mL 3  . Insulin Pen Needle (UNIFINE PEN NEEDLES) 32G X 4 MM MISC 1 application by Does not apply route in the morning, at noon, and at bedtime. 200 each 1  . meclizine (ANTIVERT) 25 MG tablet Take 1 tablet (25 mg total) by mouth 3 (three) times daily as needed for dizziness. 30 tablet 0  . megestrol (MEGACE) 40 MG tablet Take 1 tablet (40 mg total) by mouth 2 (two) times daily. Can increase to two tablets twice a day in the event of heavy bleeding 60 tablet 5  . metFORMIN (GLUCOPHAGE) 1000 MG tablet Take 1 tablet (1,000 mg total) by mouth 2 (two) times daily with a meal. 90 tablet 3  . mirtazapine (REMERON) 30 MG tablet Take 1 tablet (30 mg total) by mouth at bedtime. 30 tablet 2  . omeprazole (PRILOSEC) 20 MG capsule Take 1 capsule (20 mg total) by mouth daily before breakfast. 30 capsule 2  . Polysaccharide Iron Complex 150 MG TABS Take 150 mg by mouth daily. 30 tablet 0   Current Facility-Administered Medications  Medication Dose Route Frequency Provider Last Rate Last Admin  . medroxyPROGESTERone (DEPO-PROVERA) injection 150 mg  150 mg Intramuscular Q90 days Chancy Milroy, MD   150 mg at 04/04/20 1615     Musculoskeletal: Strength & Muscle Tone: Unable to assess due to telehealth visit Gait & Station: Unable to assess due to telehealth visit Patient leans: N/A  Psychiatric Specialty Exam: Review of Systems  There were no vitals taken for this visit.There is no height or weight on file to calculate BMI.  General Appearance: Well Groomed  Eye Contact:  Good  Speech:  Clear and Coherent and Normal Rate  Volume:  Normal  Mood:  Anxious, Depressed and Irritable  Affect:   Congruent  Thought Process:  Coherent, Goal Directed and Linear  Orientation:  Full (Time, Place, and Person)  Thought Content: Logical and Hallucinations: Auditory Tactile Visual   Suicidal Thoughts:  Yes.  without intent/plan  Homicidal Thoughts:  No  Memory:  Immediate;  Good Recent;   Good Remote;   Good  Judgement:  Good  Insight:  Good  Psychomotor Activity:  Normal  Concentration:  Concentration: Good and Attention Span: Good  Recall:  Good  Fund of Knowledge: Good  Language: Good  Akathisia:  No  Handed:  Right  AIMS (if indicated): Not done  Assets:  Communication Skills Desire for Improvement Financial Resources/Insurance Housing Intimacy Social Support  ADL's:  Intact  Cognition: WNL  Sleep:  Poor   Screenings: GAD-7   Flowsheet Row Video Visit from 06/03/2020 in [facility] Office Visit from 05/05/2020 in Center for [place] [facility] Clinical Support from 04/04/2020 in Center for [employer] at [facility] Video Visit from 03/11/2020 in [facility] Office Visit from 02/19/2020 in [place]  Total GAD-7 Score 19 17 11 20 20     PHQ2-9   Flowsheet Row Video Visit from 06/03/2020 in [facility] Office Visit from 05/25/2020 in [place] Office Visit from 05/05/2020 in Center for [place] [facility] Office Visit from 04/05/2020 in [place] from 04/04/2020 in Center for [place] at [facility]  PHQ-2 Total Score 5 5 4 2 2   PHQ-9 Total Score 19 16 14 14 10     Flowsheet Row Video Visit from 06/03/2020 in [place] No Risk       Assessment and Plan: Patient endorses symptoms of anxiety,depression, and insomnia. She  notes she did note start her medications that were prescribed at her last visit. Today she notes that she would like to restart her medications.  At this time trazodone will not be refilled. Patient will use Mirtazapine to manage her sleep, anxiety and depression. She will continue all other medications as prescribed.   1. Generalized anxiety disorder  Continue- busPIRone (BUSPAR) 10 MG tablet; Take 1 tablet (10 mg total) by mouth 3 (three) times daily.  Dispense: 90 tablet; Refill: 2 Continue- hydrOXYzine (ATARAX/VISTARIL) 10 MG tablet; Take 1 tablet (10 mg total) by mouth 3 (three) times daily as needed.  Dispense: 90 tablet; Refill: 2 Increased- mirtazapine (REMERON) 30 MG tablet; Take 1 tablet (30 mg total) by mouth at bedtime.  Dispense: 30 tablet; Refill: 2  2. Severe episode of recurrent major depressive disorder, with psychotic features ([place])  Start- ARIPiprazole (ABILIFY) 5 MG tablet; Take 1 tablet (5 mg total) by mouth daily.  Dispense: 30 tablet; Refill: 2  Follow up in 3 months Follow up with therapy  Salley Slaughter, NP 06/03/2020, 1:20 PM

## 2020-06-08 ENCOUNTER — Telehealth: Payer: Self-pay

## 2020-06-08 NOTE — Telephone Encounter (Signed)
Left message asking for callback about documentation needed for patient assistance apps ([employer]ofi & novo nordisk) for medicines: Lantus & Novolog. Need to verify if any income at all & household size.   Call back number [phone]

## 2020-06-10 ENCOUNTER — Encounter: Payer: Self-pay | Admitting: Internal Medicine

## 2020-06-14 ENCOUNTER — Encounter: Payer: Self-pay | Admitting: Gastroenterology

## 2020-06-14 ENCOUNTER — Other Ambulatory Visit (INDEPENDENT_AMBULATORY_CARE_PROVIDER_SITE_OTHER): Payer: Self-pay

## 2020-06-14 ENCOUNTER — Other Ambulatory Visit: Payer: Self-pay

## 2020-06-14 ENCOUNTER — Other Ambulatory Visit: Payer: Self-pay | Admitting: Gastroenterology

## 2020-06-14 ENCOUNTER — Ambulatory Visit (INDEPENDENT_AMBULATORY_CARE_PROVIDER_SITE_OTHER): Payer: Self-pay | Admitting: Gastroenterology

## 2020-06-14 VITALS — BP 110/78 | HR 94 | Ht 61.0 in | Wt 192.0 lb

## 2020-06-14 DIAGNOSIS — K6289 Other specified diseases of anus and rectum: Secondary | ICD-10-CM

## 2020-06-14 DIAGNOSIS — K921 Melena: Secondary | ICD-10-CM

## 2020-06-14 DIAGNOSIS — R1084 Generalized abdominal pain: Secondary | ICD-10-CM

## 2020-06-14 LAB — CBC
HCT: 36.4 % (ref 36.0–46.0)
Hemoglobin: 11.9 g/dL — ABNORMAL LOW (ref 12.0–15.0)
MCHC: 32.7 g/dL (ref 30.0–36.0)
MCV: 80.9 fl (ref 78.0–100.0)
Platelets: 356 10*3/uL (ref 150.0–400.0)
RBC: 4.5 Mil/uL (ref 3.87–5.11)
RDW: 15.4 % (ref 11.5–15.5)
WBC: 10.8 10*3/uL — ABNORMAL HIGH (ref 4.0–10.5)

## 2020-06-14 MED ORDER — AMBULATORY NON FORMULARY MEDICATION: 1 refills | Status: DC

## 2020-06-14 MED ORDER — DICYCLOMINE HCL 10 MG PO CAPS: 10.0000 mg | ORAL_CAPSULE | Freq: Four times a day (QID) | ORAL | 1 refills | Status: DC | PRN

## 2020-06-14 NOTE — Progress Notes (Signed)
Referring Provider: Sanjuan Dame, MD Primary Care Physician:  Sanjuan Dame, MD  Reason for Consultation: Melena and bright red blood in the stool   IMPRESSION:  Blood in the stool x 1 year Intermittent abdominal pain Suspected anal fissure by recent symptoms Altered bowel habits with alternating diarrhea and constipation Poor appetite with unintentional weight loss History of hemorrhoids Cholelithiasis Family history of colon cancer: Father diagnosed at age 2  The differential is broad.  It includes outlet sources such as suspected anal fissure or hemorrhoids, as well as polyps, malignancy, ulcers, and colitis.  Given this differential I am recommending a colonoscopy. Will start treatment for suspected anal fissure today. Proceed with abdominal imaging if endoscopic is non-diagnostic. Repeat CBC to monitor for development of anemia given the extent of reported bleeding. Trial of dicyclomine for symptomatic relief in the meantime.    PLAN: CBC Dicyclomine 10 mg QID PRN abdominal pain Add a daily dose of Metamucil Nitroglycerin 0.125% gel applied to the rectum TID x 6-8 weeks (not to be used with concurrent phosphodiesterase inhibitors) Colonoscopy Proceed with abdominal ultrasound as scheduled Low threshold for cross-sectional imaging if endoscopy and ultrasound are nondiagnostic Follow-up with GYN given the ongoing vaginal bleeding  Please see the "Patient Instructions" section for addition details about the plan.  HPI: Dana Hughes is a 40 y.o. female referred by Aldine Contes for hematochezia and black stools. The history is obtained to the patient, her boyfriend who accompanies her to this appointment and review of her electronic health record. She has diabetes, endometriosis, uterine fibroids, and hemorrhoids. She has had a cholecystectomy for gallstones.  She has multiple complaints including: fatigue, decreased appetite, intermittent epigastric and left  upper quadrant abdominal pain that is relieved by defecation, severe rectal plan and bleeding that developed after having two large hard bowel movements, alternating diarrhea and constipation, intermittent  black stools, and unintentional weight loss. No symptoms are related to eating.   Has been having vaginal bleeding since 12/2019 and notes there may be some overlap in the blood that she is seeing.  Normal CBC 05/05/2020 Normal CRP and ESR 05/25/2020  Denies the use of NSAIDs or anticoagulants.   No prior abdominal imaging. No prior EGD or colonoscopy.  Abdominal ultrasound scheduled for 07/05/20  Previously evaluated by Dr. Maurene Capes. Had an ERCP for abdominal pain and abnormal liver enzymes with Dr. Maurene Capes 01/30/2009 that was normal including normal CBD and ampulla. Ultimately had cholecystectomy.   Father had colon cancer diagnosed in his late 61s and ultimately died at age 50. Paternal aunt with colon polyps. No other known family history of colon cancer or polyps. No family history of uterine/endometrial cancer, pancreatic cancer or gastric/stomach cancer.  She is concerned about cancer given her family history.    Past Medical History:  Diagnosis Date  . Anxiety   . Arthritis   . Asthma   . BV (bacterial vaginosis)   . Candidiasis   . Depression   . Diabetes mellitus without complication ([place])   . Endometriosis   . Headache(784.0)    migraines  . Hypertension    no longer takes meds  . Migraines   . Muscle spasm   . Ovarian cyst   . Pelvic pain in female 09/29/2015  . Scoliosis   . Uterine fibroid     Past Surgical History:  Procedure Laterality Date  . CHOLECYSTECTOMY    . TUBAL LIGATION      Current Outpatient Medications  Medication Sig Dispense Refill  .  albuterol (PROVENTIL HFA;VENTOLIN HFA) 108 (90 Base) MCG/ACT inhaler Inhale 2 puffs into the lungs every 6 (six) hours as needed for wheezing or shortness of breath. Reported on 09/29/2015    . albuterol (PROVENTIL)  (2.5 MG/3ML) 0.083% nebulizer solution Take 3 mLs (2.5 mg total) by nebulization every 6 (six) hours as needed for wheezing or shortness of breath. 75 mL 0  . amLODipine (NORVASC) 5 MG tablet Take 1 tablet (5 mg total) by mouth daily. 90 tablet 1  . ARIPiprazole (ABILIFY) 5 MG tablet Take 1 tablet (5 mg total) by mouth daily. 30 tablet 2  . artificial tears (LACRILUBE) OINT ophthalmic ointment Place into both eyes at bedtime as needed for dry eyes. 3.5 g 0  . busPIRone (BUSPAR) 10 MG tablet Take 1 tablet (10 mg total) by mouth 3 (three) times daily. 90 tablet 2  . diclofenac Sodium (VOLTAREN) 1 % GEL Apply 2 g topically 4 (four) times daily. 2 g 1  . glucose blood test strip Check your sugar in the morning before you eat breakfast, and one hour after a meal. 100 each 2  . glucose monitoring kit (FREESTYLE) monitoring kit 1 each by Does not apply route daily. Check glucose once in the morning before breakfast and 1 hour after a meal 1 each 0  . hydrOXYzine (ATARAX/VISTARIL) 10 MG tablet Take 1 tablet (10 mg total) by mouth 3 (three) times daily as needed. 90 tablet 2  . insulin aspart (NOVOLOG) 100 UNIT/ML FlexPen Inject 10 Units into the skin 2 (two) times daily with a meal. 6 mL 2  . insulin glargine (LANTUS SOLOSTAR) 100 UNIT/ML Solostar Pen Inject 20 Units into the skin daily at 10 pm. 6 mL 3  . Insulin Pen Needle (UNIFINE PEN NEEDLES) 32G X 4 MM MISC 1 application by Does not apply route in the morning, at noon, and at bedtime. 200 each 1  . meclizine (ANTIVERT) 25 MG tablet Take 1 tablet (25 mg total) by mouth 3 (three) times daily as needed for dizziness. 30 tablet 0  . megestrol (MEGACE) 40 MG tablet Take 1 tablet (40 mg total) by mouth 2 (two) times daily. Can increase to two tablets twice a day in the event of heavy bleeding 60 tablet 5  . metFORMIN (GLUCOPHAGE) 1000 MG tablet Take 1 tablet (1,000 mg total) by mouth 2 (two) times daily with a meal. 90 tablet 3  . mirtazapine (REMERON) 30 MG  tablet Take 1 tablet (30 mg total) by mouth at bedtime. 30 tablet 2  . omeprazole (PRILOSEC) 20 MG capsule Take 1 capsule (20 mg total) by mouth daily before breakfast. 30 capsule 2  . Polysaccharide Iron Complex 150 MG TABS Take 150 mg by mouth daily. 30 tablet 0   Current Facility-Administered Medications  Medication Dose Route Frequency Provider Last Rate Last Admin  . medroxyPROGESTERone (DEPO-PROVERA) injection 150 mg  150 mg Intramuscular Q90 days Chancy Milroy, MD   150 mg at 04/04/20 1615    Allergies as of 06/14/2020 - Review Complete 06/03/2020  Allergen Reaction Noted  . Mushroom extract complex Anaphylaxis 06/05/2014  . Latex Itching and Swelling 07/07/2017  . Penicillins Itching 01/18/2011    Family History  Problem Relation Age of Onset  . Cancer Father   . Hypertension Maternal Aunt   . Breast cancer Maternal Aunt   . Cancer Paternal Grandmother   . Breast cancer Paternal Grandmother   . Breast cancer Paternal Aunt     Social History  Socioeconomic History  . Marital status: Single    Spouse name: Not on file  . Number of children: Not on file  . Years of education: Not on file  . Highest education level: Not on file  Occupational History  . Not on file  Tobacco Use  . Smoking status: Former Research scientist (life sciences)  . Smokeless tobacco: Never Used  Vaping Use  . Vaping Use: Never used  Substance and Sexual Activity  . Alcohol use: Not Currently  . Drug use: Yes    Frequency: 1.0 times per week    Types: Marijuana  . Sexual activity: Yes    Birth control/protection: Condom, Surgical  Other Topics Concern  . Not on file  Social History Narrative  . Not on file   Social Determinants of Health   Financial Resource Strain: Not on file  Food Insecurity: Food Insecurity Present  . Worried About Charity fundraiser in the Last Year: Sometimes true  . Ran Out of Food in the Last Year: Often true  Transportation Needs: No Transportation Needs  . Lack of  Transportation (Medical): No  . Lack of Transportation (Non-Medical): No  Physical Activity: Not on file  Stress: Not on file  Social Connections: Not on file  Intimate Partner Violence: Not on file    Review of Systems: 12 system ROS is negative except as noted above.   Physical Exam: General:   Alert,  well-nourished, pleasant and cooperative in NAD Head:  Normocephalic and atraumatic. Eyes:  Sclera clear, no icterus.   Conjunctiva pink. Ears:  Normal auditory acuity. Nose:  No deformity, discharge,  or lesions. Mouth:  No deformity or lesions.   Neck:  Supple; no masses or thyromegaly. Lungs:  Clear throughout to auscultation.   No wheezes. Heart:  Regular rate and rhythm; no murmurs. Abdomen:  Soft,nontender, nondistended, normal bowel sounds, no rebound or guarding. No hepatosplenomegaly.   Rectal:  Deferred  Msk:  Symmetrical. No boney deformities LAD: No inguinal or umbilical LAD Extremities:  No clubbing or edema. Neurologic:  Alert and  oriented x4;  grossly nonfocal Skin:  Intact without significant lesions or rashes. Psych:  Alert and cooperative. Normal mood and affect.   Dejanique Ruehl L. Tarri Glenn, MD, MPH 06/14/2020, 1:11 PM

## 2020-06-14 NOTE — Progress Notes (Signed)
[place]: [employer]  Document: Rx for [employer]  Rx has been faxed successfully to [employer]. Document(s) and fax confirmation(s) have been placed in the faxed file for future reference.

## 2020-06-14 NOTE — Patient Instructions (Addendum)
It was a pleasure to meet you today. Based on our discussion, I am providing you with my recommendations below:  RECOMMENDATION(S):   To better evaluate your symptoms, I am recommending labs and a colonoscopy. I would also like for you to try Dicyclomine, Nitroglycerine and to add a daily dose of Metamucil.  LABS:   . Please proceed to the basement level for lab work before leaving today. Press "B" on the elevator. The lab is located at the first door on the left as you exit the elevator.  HEALTHCARE LAWS AND MY CHART RESULTS:   . Due to recent changes in healthcare laws, you may see results of your imaging and/or laboratory studies on MyChart before I have had a chance to review them.  I understand that in some cases there may be results that are confusing or concerning to you. Please understand that not all results are received at same time and often I may need to interpret multiple results in order to provide you with the best plan of care or course of treatment. Therefore, I ask that you please give me 48 hours to thoroughly review all your results before contacting my office for clarification.   COLONOSCOPY:   . You have been scheduled for a colonoscopy. Please follow written instructions given to you at your visit today.   PREP:   . Please pick up your prep supplies at the pharmacy within the next 1-3 days.  INHALERS:   . If you use inhalers (even only as needed), please bring them with you on the day of your procedure.  COLONOSCOPY TIPS:  . To reduce nausea and dehydration, stay well hydrated for 3-4 days prior to the exam.  . To prevent skin/hemorrhoid irritation - prior to wiping, put A&Dointment or vaseline on the toilet paper. Marland Kitchen Keep a towel or pad on the bed.  Marland Kitchen BEFORE STARTING YOUR PREP, drink  64oz of clear liquids in the morning. This will help to flush the colon and will ensure you are well hydrated!!!!  NOTE - This is in addition to the fluids required for to complete  your prep. . Use of a flavored hard candy, such as grape Anise Salvo, can counteract some of the flavor of the prep and may prevent some nausea.   PRESCRIPTION MEDICATION(S):   We have sent the following medication(s) to your pharmacy:  . Dicyclomine - please take 10mg  by mouth 4 times daily as needed for abdominal pain  NOTE: If your medication(s) requires a PRIOR AUTHORIZATION, we will receive notification from your pharmacy. Once received, the process to submit for approval may take up to 7-10 business days. You will be contacted about any denials we have received from your insurance company as well as alternatives recommended by your provider.  [facility]:  . The following medication has been prescribed:   Nitroglycerin 0.125% gel - please apply a small amount to the rectum 3 times daily x 6-8 weeks. o This medication MUST be compounded by a compounding pharmacy. Your prescription has been sent to:  [facility] Address: [address], [place], [place] [address]  [phone]  . Please DO NOT go directly from our office to pick up this medication! Give the pharmacy 1 day to process the prescription. Extra time is required for them to compound your medication.  OVER THE COUNTER MEDICATION(S):   Please purchase the following medications over the counter and take as directed:  Marland Kitchen Metamucil - please take one  dose daily. Mix and consume according to package instructions.  GYNECOLOGIST:  You requested a referral to a new gynecologist. Please check with your insurance to determine which provider is covered by your plan.  BMI:  . If you are age 48 or younger, your body mass index should be between 19-25. Your There is no height or weight on file to calculate BMI. If this is out of the aformentioned range listed, please consider follow up with your Primary Care Provider.   Thank you for trusting me with your gastrointestinal care!    Thornton Park, MD,  MPH

## 2020-06-15 MED FILL — DICYCLOMINE 10 MG CAPSULE: 10 | 7 days supply | Qty: 30 | Fill #0

## 2020-06-15 NOTE — Progress Notes (Signed)
Internal Medicine Clinic Attending  Case discussed with Dr. Myrtie Hawk  At the time of the visit.  We reviewed the resident's history and pertinent patient test results.  I agree with the assessment, diagnosis, and plan of care documented in the resident's note.

## 2020-06-16 ENCOUNTER — Telehealth: Payer: Self-pay | Admitting: Gastroenterology

## 2020-06-16 NOTE — Telephone Encounter (Signed)
Patient called states she has not been able to get the prep medication due to cost and is looking to cancel is there anything that can be done to help her.

## 2020-06-16 NOTE — Telephone Encounter (Signed)
Called pt and provided her with Dr. Tarri Glenn suggestions below. States, "I should have some money by next week", further added, "I will see if I have enough to get the medicine next week". Provided her with GoodRx website and advised she is welcome to look into this option as well to see if this might be more affordable. Reminded, if there is an affordable option/location, she will need to call the office so the Rx can be sent to the most affordable location. Verbalized appreciation, acceptance and understanding.

## 2020-06-16 NOTE — Telephone Encounter (Signed)
Please review. Ordered was the Miralax split dose KNOWING she may have a financial strain. A $5.00 coupon was given to her to use for purchase of the Miralax. Reviewing cost a [employer], out of pocket expense for ALL products WITH coupon included is $11.15. We do not have any samples to provide for brand name preps (ie: Suprep, Plenvu, etc)

## 2020-06-16 NOTE — Telephone Encounter (Signed)
Pt returned call. Advised she was finally able to pick up her Miralax split dose prep supplies. States she is still unable to proceed with colonoscopy tomorrow. Requested to reschedule her colonoscopy to an alternate date. While on the phone with pt, she indicated she cannot afford Bentyl. Requesting an alternative. Advised I will route this message to Dr. Tarri Glenn for her to further advise. Reminded pt about her VM being full. States she will clear so I can leave a message with Dr. Tarri Glenn instructions.

## 2020-06-16 NOTE — Telephone Encounter (Signed)
Called pt to review her concern and to further address. Attempted to LVM. However, pt VM is full and not accepting incoming Vm's at this time. In addition, pt VM indicates, "If you received this VM it is because I am out of minutes".

## 2020-06-16 NOTE — Telephone Encounter (Signed)
I am not aware of any other options.

## 2020-06-16 NOTE — Telephone Encounter (Signed)
SECOND ATTEMPT:  Called pt re: her inability to afford prep. Pt again failed to answer phone. VM still indicates it is full and that she is out of minutes. Further discussed with Dr. Tarri Glenn. Per Dr. Tarri Glenn direction, colonoscopy canceled d/t failure to return calls. Further added that if pt could not afford prep, she likely has not begun prep at this time.

## 2020-06-16 NOTE — Telephone Encounter (Signed)
I do not know of a less expensive option than Bentyl other than possibly peppermint oil. Could use two Altoids taken prior to meals. She may want to use GoodRX too see where she could find the Bentyl for the lowest price. Thanks.

## 2020-06-17 ENCOUNTER — Encounter: Payer: Medicaid Other | Admitting: Gastroenterology

## 2020-06-17 ENCOUNTER — Encounter: Payer: Medicaid Other | Admitting: Internal Medicine

## 2020-06-20 ENCOUNTER — Ambulatory Visit (INDEPENDENT_AMBULATORY_CARE_PROVIDER_SITE_OTHER): Payer: Self-pay | Admitting: Internal Medicine

## 2020-06-20 ENCOUNTER — Other Ambulatory Visit: Payer: Self-pay

## 2020-06-20 ENCOUNTER — Encounter: Payer: Self-pay | Admitting: Internal Medicine

## 2020-06-20 ENCOUNTER — Other Ambulatory Visit (HOSPITAL_COMMUNITY): Payer: Self-pay | Admitting: Internal Medicine

## 2020-06-20 VITALS — BP 121/96 | HR 98 | Temp 97.8°F | Ht 63.0 in | Wt 189.9 lb

## 2020-06-20 DIAGNOSIS — R1084 Generalized abdominal pain: Secondary | ICD-10-CM

## 2020-06-20 DIAGNOSIS — R102 Pelvic and perineal pain: Secondary | ICD-10-CM

## 2020-06-20 DIAGNOSIS — R058 Other specified cough: Secondary | ICD-10-CM | POA: Insufficient documentation

## 2020-06-20 DIAGNOSIS — R638 Other symptoms and signs concerning food and fluid intake: Secondary | ICD-10-CM | POA: Insufficient documentation

## 2020-06-20 DIAGNOSIS — R1013 Epigastric pain: Secondary | ICD-10-CM

## 2020-06-20 DIAGNOSIS — B370 Candidal stomatitis: Secondary | ICD-10-CM | POA: Insufficient documentation

## 2020-06-20 LAB — POCT URINE PREGNANCY: Preg Test, Ur: NEGATIVE

## 2020-06-20 MED ORDER — FLUTICASONE PROPIONATE 50 MCG/ACT NA SUSP: 1.0000 | Freq: Two times a day (BID) | NASAL | 1 refills | Status: DC

## 2020-06-20 MED ORDER — NYSTATIN 100000 UNIT/ML MT SUSP: 5.0000 mL | Freq: Four times a day (QID) | OROMUCOSAL | 0 refills | Status: AC

## 2020-06-20 MED ORDER — DICYCLOMINE HCL 10 MG PO CAPS
10.0000 mg | ORAL_CAPSULE | Freq: Four times a day (QID) | ORAL | 1 refills | Status: DC | PRN
Start: 2020-06-20 — End: 2022-05-16

## 2020-06-20 NOTE — Progress Notes (Signed)
° °  CC: Pelvic pain  HPI:  [name] is a 40 y.o. with a PMHx as listed below who presents to the clinic for pelvic pain.   Please see the Encounters tab for problem-based Assessment & Plan regarding status of patient's acute and chronic conditions.  Past Medical History:  Diagnosis Date   Anxiety    Arthritis    Asthma    BV (bacterial vaginosis)    Candidiasis    Depression    Diabetes mellitus without complication ([place])    Endometriosis    Headache(784.0)    migraines   Hypertension    no longer takes meds   Migraines    Muscle spasm    Ovarian cyst    Pelvic pain in female 09/29/2015   Scoliosis    Uterine fibroid    Review of Systems: Review of Systems  Constitutional: Positive for weight loss. Negative for chills and fever.  Gastrointestinal: Positive for abdominal pain.  Genitourinary:       - vaginal bleeding  Musculoskeletal:       + pelvic pain   Physical Exam:  Vitals:   06/20/20 1555  BP: (!) 121/96  Pulse: 98  Temp: 97.8 F (36.6 C)  TempSrc: Oral  SpO2: 100%  Weight: 189 lb 14.4 oz (86.1 kg)  Height: 5\' 3"  (1.6 m)   Physical Exam Vitals and nursing note reviewed.  Constitutional:      General: She is not in acute distress.    Appearance: She is obese.  Abdominal:     Palpations: Abdomen is soft.     Tenderness: There is abdominal tenderness (point tenderness overlying 4-5 cm area in the LLQ) in the left lower quadrant.     Hernia: No hernia is present.  Musculoskeletal:       Legs:  Skin:    General: Skin is warm and dry.  Neurological:     General: No focal deficit present.     Mental Status: She is alert and oriented to person, place, and time.  Psychiatric:        Mood and Affect: Mood normal.        Behavior: Behavior normal.     Assessment & Plan:   See Encounters Tab for problem based charting.  Patient discussed with Dr. Dareen Piano

## 2020-06-20 NOTE — Patient Instructions (Addendum)
It was nice seeing you today! Thank you for choosing [facility] for your Primary Care.    Today we talked about:   1. Pelvic pain:  a. Take Tylenol 650 mg - 1000 mg every 6 hours as needed for pain  b. If the swelling worsens or the pain does not start to get better, call the clinic for a re-check in the next 7-10 days   2. Dry Cough:  a. I feel this is more likely post-nasal drip and allergies than the COVID-19 vaccine. Make sure to get the second vaccine!  b. Continue taking daily Cetirizine (Xyrtec) c. Start using Flonase, both nostrils twice per day  3. Thrush:  a. I am concerned the coating on your tongue is Thrush. I sent in a Nystatin swish to use. Use 5 mLs every 4 hours for 7 days.   4. I sent in Dr. Payton Emerald medication to the [facility] where it will be covered by the $4 dollar list.

## 2020-06-20 NOTE — Assessment & Plan Note (Addendum)
-   Refilled Bentyl today given Dr. Tarri Glenn has recently written for it, however patient cannot get it covered.  We will send to the [facility] outpatient pharmacy with the IM program.

## 2020-06-20 NOTE — Assessment & Plan Note (Signed)
Dana Hughes states that she has had a history of dry cough since February 7 when she received her first COVID-19 vaccine.  She notes that the cough is present throughout the day, however is the most severe at night when she lays down. She is also having some rib pain from the coughing.  She notes a history of allergies and asthma, currently taking Zyrtec and Flonase daily.   Assessment/plan:  Symptoms most consistent with postnasal drip secondary to allergies. Recommended she continue Zyrtec and Flonase twice daily.  Likely will need for the spring season to pass before symptoms get better.  No red flag signs or symptoms at this time.  -Continue Zyrtec and Flonase

## 2020-06-20 NOTE — Assessment & Plan Note (Signed)
[name] states that she has been having cravings for food that she is only had when she was pregnant.  She notes she does have tubal ligation, but is requesting a pregnancy test today.  Assessment/plan: Point-of-care pregnancy test negative.  Patient reassured.
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
## 2020-06-20 NOTE — Assessment & Plan Note (Signed)
On examination, Dana Hughes appears to have thrush on her tongue today.  She states that she does have this white coating every once in a while that comes and goes.  She is not able to resolve it by brushing her teeth more vigorously.  She denies any recent steroid use or inhaled corticosteroid inhaler.  Assessment/plan: We will treat symptomatically with nystatin, however I am uncertain on the etiology of her thrush. Given that she has had weight loss and unprotected sexual encounters in the past 1 month, will retest for HIV although she has been negative in the past, with most recent test approximately 6-7 weeks ago.  This possibly could be secondary to her uncontrolled diabetes as well.   -HIV pending -Nystatin swish 4 times daily for 7 days

## 2020-06-21 LAB — HIV ANTIBODY (ROUTINE TESTING W REFLEX): HIV Screen 4th Generation wRfx: NONREACTIVE

## 2020-06-22 ENCOUNTER — Other Ambulatory Visit: Payer: Self-pay

## 2020-06-22 ENCOUNTER — Encounter: Payer: Self-pay | Admitting: Gastroenterology

## 2020-06-22 ENCOUNTER — Ambulatory Visit (AMBULATORY_SURGERY_CENTER): Payer: Self-pay | Admitting: Gastroenterology

## 2020-06-22 VITALS — BP 116/76 | HR 82 | Temp 96.6°F | Resp 29 | Ht 61.0 in | Wt 192.0 lb

## 2020-06-22 DIAGNOSIS — K600 Acute anal fissure: Secondary | ICD-10-CM

## 2020-06-22 DIAGNOSIS — Z538 Procedure and treatment not carried out for other reasons: Secondary | ICD-10-CM

## 2020-06-22 DIAGNOSIS — Z800 Family history of malignant neoplasm of digestive organs: Secondary | ICD-10-CM

## 2020-06-22 DIAGNOSIS — K921 Melena: Secondary | ICD-10-CM

## 2020-06-22 MED ORDER — SODIUM CHLORIDE 0.9 % IV SOLN
500.0000 mL | INTRAVENOUS | Status: DC
Start: 2020-06-22 — End: 2020-08-12

## 2020-06-22 MED ORDER — AMBULATORY NON FORMULARY MEDICATION: 0 refills | Status: DC

## 2020-06-22 NOTE — Progress Notes (Signed)
Pt Drowsy. VSS. To PACU, report to RN. No anesthetic complications noted.  

## 2020-06-22 NOTE — Op Note (Signed)
[facility] Patient Name: Dana Hughes Procedure Date: 06/22/2020 1:33 PM MRN: [id number] Endoscopist: Thornton Park MD, MD Age: 40 Referring MD:  Date of Birth: 05/12/1980 Gender: Female Account #: [id number] Procedure:                Colonoscopy Indications:              Abdominal pain, Gastrointestinal bleeding, Change                            in bowel habits Medicines:                Monitored Anesthesia Care Procedure:                Pre-Anesthesia Assessment:                           - Prior to the procedure, a History and Physical                            was performed, and patient medications and                            allergies were reviewed. The patient's tolerance of                            previous anesthesia was also reviewed. The risks                            and benefits of the procedure and the sedation                            options and risks were discussed with the patient.                            All questions were answered, and informed consent                            was obtained. Prior Anticoagulants: The patient has                            taken no previous anticoagulant or antiplatelet                            agents. ASA Grade Assessment: II - A patient with                            mild systemic disease. After reviewing the risks                            and benefits, the patient was deemed in                            satisfactory condition to undergo the procedure.  After obtaining informed consent, the colonoscope                            was passed under direct vision. Throughout the                            procedure, the patient's blood pressure, pulse, and                            oxygen saturations were monitored continuously. The                            Olympus CF-HQ190L [id number]) Colonoscope was                            introduced through the anus with the intention  of                            advancing to the ileum. The scope was advanced to                            the sigmoid colon before the procedure was aborted.                            Medications were given. The colonoscopy was                            extremely difficult due to poor endoscopic                            visualization. The patient tolerated the procedure                            well. The quality of the bowel preparation was                            inadequate. The rectum was photographed. Scope In: 1:40:11 PM Scope Out: 1:43:54 PM Total Procedure Duration: 0 hours 3 minutes 43 seconds  Findings:                 A posterior anal fissure was found on perianal exam.                           Extensive amounts of stool was found in the entire                            colon, precluding any meaningful visualization. Complications:            No immediate complications. Estimated Blood Loss:     Estimated blood loss: none. Impression:               - Preparation of the colon was inadequate.                           - Anal fissure  found on perianal exam. Recommendation:           - Patient has a contact number available for                            emergencies. The signs and symptoms of potential                            delayed complications were discussed with the                            patient. Return to normal activities tomorrow.                            Written discharge instructions were provided to the                            patient.                           - Resume previous diet.                           - Continue present medications.                           - Add a daily dose of Metamucil.                           - Nitroglycerin 0.125% gel applied to the rectum                            TID x 6-8 weeks (not to be used with concurrent                            phosphodiesterase inhibitors).                           - Repeat  colonoscopy with 2 day bowel prep. Thornton Park MD, MD 06/22/2020 1:53:28 PM This report has been signed electronically.

## 2020-06-22 NOTE — Progress Notes (Signed)
Pt's states no medical or surgical changes since previsit or office visit. 

## 2020-06-22 NOTE — Progress Notes (Signed)
Internal Medicine Clinic Attending  Case discussed with Dr. Basaraba  At the time of the visit.  We reviewed the resident's history and exam and pertinent patient test results.  I agree with the assessment, diagnosis, and plan of care documented in the resident's note.  

## 2020-06-22 NOTE — Patient Instructions (Signed)
Discharge instructions given. Incomplete exam. Reschedule repeat colonoscopy. Prescription given. YOU HAD AN ENDOSCOPIC PROCEDURE TODAY AT [facility]:   Refer to the procedure report that was given to you for any specific questions about what was found during the examination.  If the procedure report does not answer your questions, please call your gastroenterologist to clarify.  If you requested that your care partner not be given the details of your procedure findings, then the procedure report has been included in a sealed envelope for you to review at your convenience later.  YOU SHOULD EXPECT: Some feelings of bloating in the abdomen. Passage of more gas than usual.  Walking can help get rid of the air that was put into your GI tract during the procedure and reduce the bloating. If you had a lower endoscopy (such as a colonoscopy or flexible sigmoidoscopy) you may notice spotting of blood in your stool or on the toilet paper. If you underwent a bowel prep for your procedure, you may not have a normal bowel movement for a few days.  Please Note:  You might notice some irritation and congestion in your nose or some drainage.  This is from the oxygen used during your procedure.  There is no need for concern and it should clear up in a day or so.  SYMPTOMS TO REPORT IMMEDIATELY:   Following lower endoscopy (colonoscopy or flexible sigmoidoscopy):  Excessive amounts of blood in the stool  Significant tenderness or worsening of abdominal pains  Swelling of the abdomen that is new, acute  Fever of 100F or higher   For urgent or emergent issues, a gastroenterologist can be reached at any hour by calling [phone]. Do not use MyChart messaging for urgent concerns.    DIET:  We do recommend a small meal at first, but then you may proceed to your regular diet.  Drink plenty of fluids but you should avoid alcoholic beverages for 24 hours.  ACTIVITY:  You should plan to take  it easy for the rest of today and you should NOT DRIVE or use heavy machinery until tomorrow (because of the sedation medicines used during the test).    FOLLOW UP: Our staff will call the number listed on your records 48-72 hours following your procedure to check on you and address any questions or concerns that you may have regarding the information given to you following your procedure. If we do not reach you, we will leave a message.  We will attempt to reach you two times.  During this call, we will ask if you have developed any symptoms of COVID 19. If you develop any symptoms (ie: fever, flu-like symptoms, shortness of breath, cough etc.) before then, please call [phone].  If you test positive for Covid 19 in the 2 weeks post procedure, please call and report this information to [place].    If any biopsies were taken you will be contacted by phone or by letter within the next 1-3 weeks.  Please call us at [phone] if you have not heard about the biopsies in 3 weeks.    SIGNATURES/CONFIDENTIALITY: You and/or your care partner have signed paperwork which will be entered into your electronic medical record.  These signatures attest to the fact that that the information above on your After Visit Summary has been reviewed and is understood.  Full responsibility of the confidentiality of this discharge information lies with you and/or your care-partner.

## 2020-06-24 ENCOUNTER — Telehealth: Payer: Self-pay | Admitting: *Deleted

## 2020-06-24 ENCOUNTER — Telehealth: Payer: Self-pay

## 2020-06-24 NOTE — Telephone Encounter (Signed)
NO ANSWER, MESSAGE LEFT FOR PATIENT. 

## 2020-06-24 NOTE — Telephone Encounter (Signed)
  Follow up Call-  Call back number 06/22/2020  Post procedure Call Back phone  # [phone]  Permission to leave phone message Yes  Some recent data might be hidden     Patient questions:  Do you have a fever, pain , or abdominal swelling? No. Pain Score  0 *  Have you tolerated food without any problems? Yes.    Have you been able to return to your normal activities? Yes.    Do you have any questions about your discharge instructions: Diet   No. Medications  No. Follow up visit  No.  Do you have questions or concerns about your Care? No.  Actions: * If pain score is 4 or above: No action needed, pain <4.

## 2020-07-02 ENCOUNTER — Encounter: Payer: Self-pay | Admitting: Internal Medicine

## 2020-07-03 ENCOUNTER — Other Ambulatory Visit: Payer: Self-pay

## 2020-07-03 ENCOUNTER — Encounter: Payer: Self-pay | Admitting: Internal Medicine

## 2020-07-03 ENCOUNTER — Ambulatory Visit (INDEPENDENT_AMBULATORY_CARE_PROVIDER_SITE_OTHER): Payer: Self-pay

## 2020-07-03 ENCOUNTER — Encounter (HOSPITAL_COMMUNITY): Payer: Self-pay | Admitting: Emergency Medicine

## 2020-07-03 ENCOUNTER — Ambulatory Visit (HOSPITAL_COMMUNITY)
Admission: EM | Admit: 2020-07-03 | Discharge: 2020-07-03 | Disposition: A | Discharge status: Home / Self Care | Payer: Self-pay | Attending: Emergency Medicine | Admitting: Emergency Medicine

## 2020-07-03 DIAGNOSIS — J45909 Unspecified asthma, uncomplicated: Secondary | ICD-10-CM | POA: Insufficient documentation

## 2020-07-03 DIAGNOSIS — Z91040 Latex allergy status: Secondary | ICD-10-CM | POA: Insufficient documentation

## 2020-07-03 DIAGNOSIS — Z20822 Contact with and (suspected) exposure to covid-19: Secondary | ICD-10-CM | POA: Insufficient documentation

## 2020-07-03 DIAGNOSIS — E119 Type 2 diabetes mellitus without complications: Secondary | ICD-10-CM | POA: Insufficient documentation

## 2020-07-03 DIAGNOSIS — I10 Essential (primary) hypertension: Secondary | ICD-10-CM | POA: Insufficient documentation

## 2020-07-03 DIAGNOSIS — Z794 Long term (current) use of insulin: Secondary | ICD-10-CM | POA: Insufficient documentation

## 2020-07-03 DIAGNOSIS — J069 Acute upper respiratory infection, unspecified: Secondary | ICD-10-CM | POA: Insufficient documentation

## 2020-07-03 DIAGNOSIS — Z8249 Family history of ischemic heart disease and other diseases of the circulatory system: Secondary | ICD-10-CM | POA: Insufficient documentation

## 2020-07-03 DIAGNOSIS — Z880 Allergy status to penicillin: Secondary | ICD-10-CM | POA: Insufficient documentation

## 2020-07-03 DIAGNOSIS — Z87891 Personal history of nicotine dependence: Secondary | ICD-10-CM | POA: Insufficient documentation

## 2020-07-03 DIAGNOSIS — R0600 Dyspnea, unspecified: Secondary | ICD-10-CM

## 2020-07-03 DIAGNOSIS — R058 Other specified cough: Secondary | ICD-10-CM

## 2020-07-03 DIAGNOSIS — R5383 Other fatigue: Secondary | ICD-10-CM

## 2020-07-03 DIAGNOSIS — R062 Wheezing: Secondary | ICD-10-CM

## 2020-07-03 DIAGNOSIS — Z79899 Other long term (current) drug therapy: Secondary | ICD-10-CM | POA: Insufficient documentation

## 2020-07-03 HISTORY — DX: Bronchitis, not specified as acute or chronic: J40

## 2020-07-03 MED ORDER — BENZONATATE 100 MG PO CAPS: 100.0000 mg | ORAL_CAPSULE | Freq: Three times a day (TID) | ORAL | 0 refills | Status: DC | PRN

## 2020-07-03 NOTE — ED Triage Notes (Signed)
Pt presents today with c/o of SOB and cough x 2 weeks. +sorethroat

## 2020-07-03 NOTE — Discharge Instructions (Addendum)
Your chest x-ray is normal.    Your COVID test is pending.  You should self quarantine until the test result is back.    Take the prescribed Tessalon Perles as needed for cough.  Take Tylenol or ibuprofen as needed for fever or discomfort.  Rest and keep yourself hydrated.    Follow-up with your primary care provider if your symptoms are not improving.

## 2020-07-03 NOTE — ED Provider Notes (Signed)
[facility]    CSN: [id number] Arrival date & time: 07/03/20  1515      History   Chief Complaint Chief Complaint  Patient presents with  . Shortness of Breath  . Cough    HPI Dana Hughes is a 40 y.o. female.   Patient presents with 2-week history of cough productive of clear phlegm and shortness of breath.  She also reports fever, chills, sore throat, and 1 episode of emesis yesterday.  No emesis today.  She denies wheezing, rash,  diarrhea, or other symptoms.  OTC treatment attempted at home.  Her medical history includes diabetes, hypertension, asthma, arthritis, depression with psychotic features, anxiety.  The history is provided by the patient and medical records.    Past Medical History:  Diagnosis Date  . Anxiety   . Arthritis   . Asthma   . Bronchitis   . BV (bacterial vaginosis)   . Candidiasis   . Depression   . Diabetes mellitus without complication ([place])   . Endometriosis   . Headache(784.0)    migraines  . Hypertension    no longer takes meds  . Migraines   . Muscle spasm   . Ovarian cyst   . Pelvic pain in female 09/29/2015  . Scoliosis   . Uterine fibroid     Patient Active Problem List   Diagnosis Date Noted  . Thrush 06/20/2020  . Craving for particular food 06/20/2020  . Dry cough 06/20/2020  . Epigastric abdominal pain 05/27/2020  . Dysuria 05/27/2020  . Fibroids 05/05/2020  . GYN exam for high-risk Medicare patient 05/05/2020  . Potential exposure to STD 05/05/2020  . Melena 04/08/2020  . Iron deficiency 04/08/2020  . Severe episode of recurrent major depressive disorder, with psychotic features ([place]) 03/11/2020  . Generalized anxiety disorder 03/11/2020  . Type 2 diabetes mellitus without complication, without long-term current use of insulin ([place]) 12/09/2019  . Abnormal uterine bleeding (AUB) 02/20/2019  . History of bilateral tubal ligation 02/20/2019  . Carpal tunnel syndrome, bilateral 01/16/2019  . Hypertension  07/21/2018  . Endometriosis 08/30/2017  . Pelvic pain 09/29/2015    Past Surgical History:  Procedure Laterality Date  . CHOLECYSTECTOMY    . TUBAL LIGATION      OB History    Gravida  4   Para  3   Term  3   Preterm  0   AB  1   Living  3     SAB  1   IAB  0   Ectopic  0   Multiple  0   Live Births  3        Obstetric Comments  SVD x 3         Home Medications    Prior to Admission medications   Medication Sig Start Date End Date Taking? Authorizing Provider  albuterol (PROVENTIL HFA;VENTOLIN HFA) 108 (90 Base) MCG/ACT inhaler Inhale 2 puffs into the lungs every 6 (six) hours as needed for wheezing or shortness of breath. Reported on 09/29/2015   Yes [provider]  albuterol (PROVENTIL) (2.5 MG/3ML) 0.083% nebulizer solution Take 3 mLs (2.5 mg total) by nebulization every 6 (six) hours as needed for wheezing or shortness of breath. 07/02/18  Yes Wurst, [place], PA-C  amLODipine (NORVASC) 5 MG tablet Take 1 tablet (5 mg total) by mouth daily. 02/19/20  Yes Bloomfield, Carley D, DO  ARIPiprazole (ABILIFY) 5 MG tablet Take 1 tablet (5 mg total) by mouth daily. 06/03/20  Yes Eulis Canner E, NP  benzonatate (TESSALON) 100 MG capsule Take 1 capsule (100 mg total) by mouth 3 (three) times daily as needed for cough. 07/03/20  Yes Sharion Balloon, NP  busPIRone (BUSPAR) 10 MG tablet Take 1 tablet (10 mg total) by mouth 3 (three) times daily. 06/03/20  Yes Eulis Canner E, NP  dicyclomine (BENTYL) 10 MG capsule Take 1 capsule (10 mg total) by mouth 4 (four) times daily as needed for spasms (abdominal pain). 06/20/20  Yes Jose Persia, MD  insulin aspart (NOVOLOG) 100 UNIT/ML FlexPen Inject 10 Units into the skin 2 (two) times daily with a meal. 06/03/20  Yes Jose Persia, MD  insulin glargine (LANTUS SOLOSTAR) 100 UNIT/ML Solostar Pen Inject 20 Units into the skin daily at 10 pm. 06/03/20  Yes Jose Persia, MD  megestrol (MEGACE) 40 MG tablet Take 1  tablet (40 mg total) by mouth 2 (two) times daily. Can increase to two tablets twice a day in the event of heavy bleeding 05/05/20  Yes Chancy Milroy, MD  omeprazole (PRILOSEC) 20 MG capsule Take 1 capsule (20 mg total) by mouth daily before breakfast. 05/25/20  Yes Jose Persia, MD  AMBULATORY NON FORMULARY MEDICATION Nitroglycerin 0.125% gel apply a small amount to the rectum 3 times daily x 6-8 weeks. 06/14/20   Thornton Park, MD  AMBULATORY NON FORMULARY MEDICATION Medication Name: nitroglycerin 0.125 % gel applied to the rectum TID FOR 6 WEEKS 6-8 WEEKS ( NOT TO BE USED WITH CONCURRENT PHOSPHODIESTERASE INHIBITORS). 06/22/20   Thornton Park, MD  artificial tears (LACRILUBE) OINT ophthalmic ointment Place into both eyes at bedtime as needed for dry eyes. 05/02/18   Langston Masker B, PA-C  diclofenac Sodium (VOLTAREN) 1 % GEL Apply 2 g topically 4 (four) times daily. 12/09/19   Masoudi, Elhamalsadat, MD  fluticasone (FLONASE) 50 MCG/ACT nasal spray Place 1 spray into both nostrils in the morning and at bedtime. 06/20/20   Jose Persia, MD  glucose blood test strip Check your sugar in the morning before you eat breakfast, and one hour after a meal. 11/12/19   Melynda Ripple, MD  glucose monitoring kit (FREESTYLE) monitoring kit 1 each by Does not apply route daily. Check glucose once in the morning before breakfast and 1 hour after a meal 11/12/19   Melynda Ripple, MD  hydrOXYzine (ATARAX/VISTARIL) 10 MG tablet Take 1 tablet (10 mg total) by mouth 3 (three) times daily as needed. 06/03/20   Salley Slaughter, NP  Insulin Pen Needle (UNIFINE PEN NEEDLES) 32G X 4 MM MISC 1 application by Does not apply route in the morning, at noon, and at bedtime. 02/19/20   Bloomfield, Carley D, DO  meclizine (ANTIVERT) 25 MG tablet Take 1 tablet (25 mg total) by mouth 3 (three) times daily as needed for dizziness. 05/06/18   Melynda Ripple, MD  metFORMIN (GLUCOPHAGE) 1000 MG tablet Take 1 tablet (1,000  mg total) by mouth 2 (two) times daily with a meal. 12/09/19 12/08/20  Masoudi, Dorthula Rue, MD  mirtazapine (REMERON) 30 MG tablet Take 1 tablet (30 mg total) by mouth at bedtime. 06/03/20   Salley Slaughter, NP  Polysaccharide Iron Complex 150 MG TABS Take 150 mg by mouth daily. 04/07/20   Sanjuan Dame, MD    Family History Family History  Problem Relation Age of Onset  . Cancer Father   . Hypertension Maternal Aunt   . Breast cancer Maternal Aunt   . Cancer Paternal Grandmother   . Breast cancer Paternal  Grandmother   . Breast cancer Paternal Aunt     Social History Social History   Tobacco Use  . Smoking status: Former Research scientist (life sciences)  . Smokeless tobacco: Never Used  Vaping Use  . Vaping Use: Never used  Substance Use Topics  . Alcohol use: Not Currently  . Drug use: Yes    Frequency: 1.0 times per week    Types: Marijuana     Allergies   Mushroom extract complex, Latex, and Penicillins   Review of Systems Review of Systems  Constitutional: Positive for chills and fever.  HENT: Positive for sore throat. Negative for ear pain.   Eyes: Negative for pain and visual disturbance.  Respiratory: Positive for cough and shortness of breath.   Cardiovascular: Negative for chest pain and palpitations.  Gastrointestinal: Positive for vomiting. Negative for abdominal pain and diarrhea.  Genitourinary: Negative for dysuria and hematuria.  Musculoskeletal: Negative for arthralgias and back pain.  Skin: Negative for color change and rash.  Neurological: Negative for seizures and syncope.  All other systems reviewed and are negative.    Physical Exam Triage Vital Signs ED Triage Vitals  Enc Vitals Group     BP      Pulse      Resp      Temp      Temp src      SpO2      Weight      Height      Head Circumference      Peak Flow      Pain Score      Pain Loc      Pain Edu?      Excl. in [place]?    No data found.  Updated Vital Signs BP 102/63 (BP Location: Right  Arm)   Pulse (!) 115   Temp (!) 97.3 F (36.3 C) (Oral)   Resp (!) 24   SpO2 98%   Visual Acuity Right Eye Distance:   Left Eye Distance:   Bilateral Distance:    Right Eye Near:   Left Eye Near:    Bilateral Near:     Physical Exam Vitals and nursing note reviewed.  Constitutional:      General: She is not in acute distress.    Appearance: She is well-developed.  HENT:     Head: Normocephalic and atraumatic.     Right Ear: Tympanic membrane normal.     Left Ear: Tympanic membrane normal.     Nose: Nose normal.     Mouth/Throat:     Mouth: Mucous membranes are moist.     Pharynx: Oropharynx is clear.  Eyes:     Conjunctiva/sclera: Conjunctivae normal.  Cardiovascular:     Rate and Rhythm: Normal rate and regular rhythm.     Heart sounds: Normal heart sounds.  Pulmonary:     Effort: Pulmonary effort is normal. No respiratory distress.     Breath sounds: Normal breath sounds.  Abdominal:     General: Bowel sounds are normal.     Palpations: Abdomen is soft.     Tenderness: There is no abdominal tenderness. There is no guarding or rebound.  Musculoskeletal:     Cervical back: Neck supple.  Skin:    General: Skin is warm and dry.     Findings: No bruising, erythema, lesion or rash.  Neurological:     General: No focal deficit present.     Mental Status: She is alert and oriented to person, place, and time.  Gait: Gait normal.  Psychiatric:        Mood and Affect: Mood normal.        Behavior: Behavior normal.      UC Treatments / Results  Labs (all labs ordered are listed, but only abnormal results are displayed) Labs Reviewed  SARS CORONAVIRUS 2 (TAT 6-24 HRS)    EKG   Radiology DG Chest 2 View  Result Date: 07/03/2020 CLINICAL DATA:  Productive cough, wheezing, dyspnea, fatigue for 6 days EXAM: CHEST - 2 VIEW COMPARISON:  02/22/2020 chest radiograph. FINDINGS: Stable cardiomediastinal silhouette with normal heart size. No pneumothorax. No pleural  effusion. Lungs appear clear, with no acute consolidative airspace disease and no pulmonary edema. Surgical clips seen in the upper abdomen on the lateral view. IMPRESSION: No active cardiopulmonary disease. Electronically Signed   By: Ilona Sorrel M.D.   On: 07/03/2020 16:26    Procedures Procedures (including critical care time)  Medications Ordered in UC Medications - No data to display  Initial Impression / Assessment and Plan / UC Course  I have reviewed the triage vital signs and the nursing notes.  Pertinent labs & imaging results that were available during my care of the patient were reviewed by me and considered in my medical decision making (see chart for details).   Viral URI with cough.  Chest x-ray normal.  COVID pending.  Instructed patient to self quarantine until the test results are back.  Treating cough with Tessalon Perles.  Discussed other symptomatic treatment including Tylenol, rest, hydration.  Instructed patient to follow up with PCP if her symptoms are not improving.  Patient agrees to plan of care.    Final Clinical Impressions(s) / UC Diagnoses   Final diagnoses:  Viral URI with cough     Discharge Instructions     Your chest x-ray is normal.    Your COVID test is pending.  You should self quarantine until the test result is back.    Take the prescribed Tessalon Perles as needed for cough.  Take Tylenol or ibuprofen as needed for fever or discomfort.  Rest and keep yourself hydrated.    Follow-up with your primary care provider if your symptoms are not improving.        ED Prescriptions    Medication Sig Dispense Auth. Provider   benzonatate (TESSALON) 100 MG capsule Take 1 capsule (100 mg total) by mouth 3 (three) times daily as needed for cough. 21 capsule Sharion Balloon, NP     PDMP not reviewed this encounter.   Sharion Balloon, NP 07/03/20 [phone]

## 2020-07-04 ENCOUNTER — Telehealth: Payer: Self-pay

## 2020-07-04 ENCOUNTER — Ambulatory Visit (INDEPENDENT_AMBULATORY_CARE_PROVIDER_SITE_OTHER): Payer: Self-pay | Admitting: Student

## 2020-07-04 ENCOUNTER — Other Ambulatory Visit: Payer: Self-pay

## 2020-07-04 ENCOUNTER — Encounter: Payer: Self-pay | Admitting: Student

## 2020-07-04 VITALS — BP 112/87 | HR 108 | Temp 98.4°F | Ht 63.0 in | Wt 187.6 lb

## 2020-07-04 DIAGNOSIS — R051 Acute cough: Secondary | ICD-10-CM

## 2020-07-04 DIAGNOSIS — J988 Other specified respiratory disorders: Secondary | ICD-10-CM

## 2020-07-04 DIAGNOSIS — B9789 Other viral agents as the cause of diseases classified elsewhere: Secondary | ICD-10-CM

## 2020-07-04 LAB — SARS CORONAVIRUS 2 (TAT 6-24 HRS): SARS Coronavirus 2: NEGATIVE

## 2020-07-04 MED ORDER — DELSYM COUGH/CHEST CONGEST DM 5-100 MG/5ML PO LIQD: 10.0000 mL | Freq: Two times a day (BID) | ORAL | 0 refills | Status: DC

## 2020-07-04 NOTE — Telephone Encounter (Signed)
Pt is wanting some meds for yeast infection [phone]

## 2020-07-04 NOTE — Telephone Encounter (Signed)
Returned call to patient. States she has had 2 vaccinations for Covid and was tested for Covid yesterday. Result is pending. Scheduled for today at 1015 with PCP.

## 2020-07-04 NOTE — Patient Instructions (Signed)
Ms. Alberty,  It was a pleasure seeing you today!  Today we discussed your recent illness. Your lungs sound clear and you are oxygenating well. Likely this is a viral illness that will pass over time. I have prescribed cough syrup for you that you can take twice daily. Please make sure to keep drinking plenty of fluids as well. I will call you with the COVID-19 results.  In the future, if you have any needs over the weekend that staff will not be able to answer questions via myChart. You can call the operator at [phone] and ask for the Internal Medicine Clinic Resident On Call. They can talk to you and make recommendations as necessary.   We look forward to seeing you next time. Please call our clinic at [phone] if you have any questions or concerns. The best time to call is Monday-Friday from 9am-4pm, but there is someone available 24/7 at the same number. If you need medication refills, please notify your pharmacy one week in advance and they will send [place] a request.  Thank you for letting us take part in your care. Wishing you the best!  Thank you, Dr. Sanjuan Dame, MD

## 2020-07-04 NOTE — Telephone Encounter (Signed)
Dana Hughes, she was just here for an appointment and didn't mention it. She is going to need another appointment if she wants to address that.  Thanks, Doren Custard

## 2020-07-04 NOTE — Progress Notes (Signed)
   CC: cough, dyspnea  HPI:  [name] is a 40 y.o. with history as below presenting to [facility] today for cough, dyspnea.   Please see problem-based list for further details, assessments, and plans.   Past Medical History:  Diagnosis Date  . Anxiety   . Arthritis   . Asthma   . Bronchitis   . BV (bacterial vaginosis)   . Candidiasis   . Depression   . Diabetes mellitus without complication ([place])   . Endometriosis   . Headache(784.0)    migraines  . Hypertension    no longer takes meds  . Migraines   . Muscle spasm   . Ovarian cyst   . Pelvic pain in female 09/29/2015  . Scoliosis   . Uterine fibroid    Review of Systems:  As per HPI  Physical Exam:  Vitals:   07/04/20 1003  BP: 112/87  Pulse: (!) 108  Temp: 98.4 F (36.9 C)  TempSrc: Oral  SpO2: 100%  Weight: 187 lb 9.6 oz (85.1 kg)  Height: 5\' 3"  (1.6 m)   General: Appears uncomfortable, no acute distress CV: Tachycardic, regular rhythm. No murmurs, rubs, gallops appreciated. Pulm: Tachypnea. No use accessory muscles. Clear to auscultation bilaterally, no wheezing, rales, rhonchi. Neuro: Awake, alert. Moving extremities appropriately.  Assessment & Plan:   See Encounters Tab for problem based charting.  Patient discussed with Dr. Dareen Piano

## 2020-07-04 NOTE — Assessment & Plan Note (Addendum)
Patient presenting for 3d history of cough, dyspnea. Dana Hughes says overall she has not been feeling well, stating she has had to stay in bed d/t productive cough, dyspnea, and fatigue. Initially reports this has been occurring for just the past few days, but later states she has had worsening fatigue over the last two weeks. Also reports mid-sternal chest pain that occurs when she coughs, intermittent chills, 1 episode of diarrhea, and decreased appetite associated with acute illness. Denies fevers, headaches, blurry vision, nausea, vomiting. Dana Hughes states she went to an urgent care center yesterday, where she had a COVID-19 test and CXR. Reportedly was told her CXR was clear but symptoms could possibly be 2/2 asthma exacerbation due to wheezing. She has not received the results of her COVID-19 test. Of note, patient has had COVID-19 vaccine x2, denies sick contacts, recent travel, or recent surgery.  A/P: On exam, afebrile, tachycardic, normotensive, sating 100% on RA. Lungs clear to auscultation without wheezing. Explained to patient she likely has viral respiratory illness, no signs of asthma exacerbation.  Also encouraged patient to either call the on-call resident or go to an urgent care center/ED if she has further issues overnight or on the weekend, as myChart messages are only answered during business days. Will obtain COVID-19 swab today, prescribe cough suppressant for home. - COVID-19 test - Delsym syrup BID - If dyspnea worsens, go to ED

## 2020-07-05 ENCOUNTER — Ambulatory Visit (HOSPITAL_COMMUNITY)
Admission: RE | Admit: 2020-07-05 | Discharge: 2020-07-05 | Disposition: A | Discharge status: Home / Self Care | Payer: Self-pay | Source: Ambulatory Visit | Attending: Internal Medicine | Admitting: Internal Medicine

## 2020-07-05 ENCOUNTER — Other Ambulatory Visit: Payer: Self-pay

## 2020-07-05 DIAGNOSIS — R1013 Epigastric pain: Secondary | ICD-10-CM

## 2020-07-05 DIAGNOSIS — R300 Dysuria: Secondary | ICD-10-CM

## 2020-07-05 LAB — NOVEL CORONAVIRUS, NAA: SARS-CoV-2, NAA: NOT DETECTED

## 2020-07-05 LAB — SARS-COV-2, NAA 2 DAY TAT

## 2020-07-06 ENCOUNTER — Other Ambulatory Visit: Payer: Self-pay | Admitting: Student

## 2020-07-06 DIAGNOSIS — R051 Acute cough: Secondary | ICD-10-CM

## 2020-07-06 MED ORDER — BENZONATATE 100 MG PO CAPS: 100.0000 mg | ORAL_CAPSULE | Freq: Three times a day (TID) | ORAL | 0 refills | Status: DC | PRN

## 2020-07-06 NOTE — Progress Notes (Signed)
Internal Medicine Clinic Attending ? ?Case discussed with Dr. Braswell  At the time of the visit.  We reviewed the resident?s history and exam and pertinent patient test results.  I agree with the assessment, diagnosis, and plan of care documented in the resident?s note.  ?

## 2020-07-11 ENCOUNTER — Other Ambulatory Visit: Payer: Self-pay

## 2020-07-11 ENCOUNTER — Encounter: Payer: Self-pay | Admitting: Internal Medicine

## 2020-07-11 ENCOUNTER — Ambulatory Visit (HOSPITAL_COMMUNITY)
Admission: RE | Admit: 2020-07-11 | Discharge: 2020-07-11 | Disposition: A | Discharge status: Home / Self Care | Payer: Self-pay | Source: Ambulatory Visit | Attending: Internal Medicine | Admitting: Internal Medicine

## 2020-07-11 ENCOUNTER — Ambulatory Visit (HOSPITAL_COMMUNITY): Payer: Self-pay

## 2020-07-11 DIAGNOSIS — R300 Dysuria: Secondary | ICD-10-CM | POA: Insufficient documentation

## 2020-07-11 DIAGNOSIS — R1013 Epigastric pain: Secondary | ICD-10-CM | POA: Insufficient documentation

## 2020-07-19 ENCOUNTER — Encounter: Payer: Self-pay | Admitting: Student

## 2020-07-19 ENCOUNTER — Other Ambulatory Visit: Payer: Self-pay

## 2020-07-19 ENCOUNTER — Ambulatory Visit (INDEPENDENT_AMBULATORY_CARE_PROVIDER_SITE_OTHER): Payer: Self-pay | Admitting: Student

## 2020-07-19 ENCOUNTER — Other Ambulatory Visit (HOSPITAL_COMMUNITY): Payer: Self-pay | Admitting: Student

## 2020-07-19 VITALS — BP 118/73 | HR 97 | Temp 98.0°F | Ht 62.0 in | Wt 191.5 lb

## 2020-07-19 DIAGNOSIS — E119 Type 2 diabetes mellitus without complications: Secondary | ICD-10-CM

## 2020-07-19 DIAGNOSIS — K921 Melena: Secondary | ICD-10-CM

## 2020-07-19 DIAGNOSIS — N939 Abnormal uterine and vaginal bleeding, unspecified: Secondary | ICD-10-CM

## 2020-07-19 LAB — POCT URINE PREGNANCY: Preg Test, Ur: NEGATIVE

## 2020-07-19 MED ORDER — EXENATIDE ER 2 MG ~~LOC~~ PEN: 2.0000 mg | PEN_INJECTOR | SUBCUTANEOUS | 0 refills | Status: DC

## 2020-07-19 MED ORDER — BYDUREON BCISE 2 MG/0.85ML ~~LOC~~ AUIJ: 2.0000 mg | AUTO-INJECTOR | SUBCUTANEOUS | 0 refills | Status: DC

## 2020-07-19 NOTE — Patient Instructions (Signed)
Dana Hughes,  It was a pleasure seeing you today!  Today we discussed your diabetes. Please start taking exenatide 2mg  once weekly. This should help decrease your sugars over time. I am going to refer you to the diabetes educator here as well to help assist with medications. At your next appointment, please   We look forward to seeing you next time. Please call our clinic at [phone] if you have any questions or concerns. The best time to call is Monday-Friday from 9am-4pm, but there is someone available 24/7 at the same number. If you need medication refills, please notify your pharmacy one week in advance and they will send [place] a request.  Thank you for letting us take part in your care. Wishing you the best!  Thank you, Dr. Sanjuan Dame, MD

## 2020-07-19 NOTE — Progress Notes (Signed)
   CC: diabetes follow-up  HPI:  [name] is a 40 y.o. with medical history as below presenting for follow-up for diabetes.  Please see problem-based list for further details, assessments, and plans.  Past Medical History:  Diagnosis Date  . Anxiety   . Arthritis   . Asthma   . Bronchitis   . BV (bacterial vaginosis)   . Candidiasis   . Depression   . Diabetes mellitus without complication (Dana Hughes)   . Endometriosis   . Headache(784.0)    migraines  . Hypertension    no longer takes meds  . Migraines   . Muscle spasm   . Ovarian cyst   . Pelvic pain in female 09/29/2015  . Scoliosis   . Uterine fibroid    Review of Systems:  As per HPI  Physical Exam:  Vitals:   07/19/20 1447  BP: 118/73  Pulse: 97  Temp: 98 F (36.7 C)  TempSrc: Oral  SpO2: 100%  Weight: 191 lb 8 oz (86.9 kg)  Height: 5\' 2"  (1.575 m)   General: Sitting comfortably in chair, no acute distress CV: Regular rate, rhythm. No m/r/g appreciated. Distal pulses 2+ bilaterally. Pulm: Normal work of breathing. Clear to auscultation bilaterally. MSK: Normal bulk, tone. No pitting edema bilaterally.  Assessment & Plan:   See Encounters Tab for problem based charting.  Patient discussed with Dr. Heber Philo

## 2020-07-20 LAB — URINALYSIS, ROUTINE W REFLEX MICROSCOPIC
Bilirubin, UA: NEGATIVE
Ketones, UA: NEGATIVE
Leukocytes,UA: NEGATIVE
Nitrite, UA: NEGATIVE
Protein,UA: NEGATIVE
RBC, UA: NEGATIVE
Specific Gravity, UA: >=1.03 — AB (ref 1.005–1.030)
Urobilinogen, Ur: 0.2 mg/dL (ref 0.2–1.0)
pH, UA: 5.5 (ref 5.0–7.5)

## 2020-07-20 LAB — MICROALBUMIN / CREATININE URINE RATIO
Creatinine, Urine: 27.1 mg/dL
Microalb/Creat Ratio: <11 mg/g creat (ref 0–29)
Microalbumin, Urine: <3 ug/mL

## 2020-07-21 ENCOUNTER — Encounter: Payer: Self-pay | Admitting: Student

## 2020-07-21 NOTE — Assessment & Plan Note (Signed)
Patient reports she recently had a colonoscopy with GI for work-up for melena, but was incomplete due to inadequate bowel prep. Patient plans on returning within the next few weeks to have repeat colonoscopy. - Follow-up with GI for repeat colonoscopy

## 2020-07-21 NOTE — Assessment & Plan Note (Addendum)
Patient presenting to [facility] today to discuss diabetes. Patient reports she has experienced polyuria with frothy urine, polydipsia, and intermittent abdominal pain. States she has been compliant with her diabetic medications. She mentions she has a family member with type 1 diabetes who has helped her with her diet. Reports she tries to eat lean meats, including fish and baked chicken. Dana Hughes says her sugars are consistently above 200 when she checks it.  Denies hypoglycemic episodes. Most recent A1c 10.8 in February.  Dana Hughes has a myriad of symptoms that are likely, at least in part, related to her uncontrolled diabetes. Given her sugars are consistently above 200, she will require further titration of medications. Plan to start GLP-1 today and continue on current insulin regimen. Once A1c closer to goal, can consider titrating insulin regimen. - Start exenatide 44m once weekly - Continue metformin - Continue lantus 20u QHS - Continue Novolog 10u BID - Urine Microalbumin/Cr and UA today - Return to clinic in 3 months for A1c check

## 2020-07-21 NOTE — Assessment & Plan Note (Signed)
Patient reports she recently passed large vaginal blood clots. Last menstrual period was about a month ago. States she is concerned that it could have been an ectopic pregnancy. She last took Depo shot in December.  Discussed with patient that it is very unlikely she is having an ectopic pregnancy given her continued periods and that she had the Depo shot within the last 3 months. Will obtain urine pregnancy test for re-assurance. Encouraged patient to follow-up with OBGYN. - Urine pregnancy test

## 2020-07-23 ENCOUNTER — Other Ambulatory Visit (HOSPITAL_COMMUNITY): Payer: Self-pay

## 2020-07-23 MED FILL — Exenatide Extended Release Susp Auto-Injector 2 MG/0.85ML: SUBCUTANEOUS | 28 days supply | Qty: 3.4 | Fill #0 | Status: CN

## 2020-07-23 NOTE — Progress Notes (Signed)
Internal Medicine Clinic Attending ? ?Case discussed with Dr. Braswell  At the time of the visit.  We reviewed the resident?s history and exam and pertinent patient test results.  I agree with the assessment, diagnosis, and plan of care documented in the resident?s note.  ?

## 2020-07-24 ENCOUNTER — Other Ambulatory Visit: Payer: Self-pay

## 2020-07-25 ENCOUNTER — Encounter: Payer: Medicaid Other | Admitting: Internal Medicine

## 2020-08-02 ENCOUNTER — Telehealth: Payer: Self-pay | Admitting: Student

## 2020-08-02 NOTE — Telephone Encounter (Signed)
Pt reporting she is not feeling any bettersince her ov on 07/19/2020 with Dr. Collene Gobble.  Pt states  the OTC medication  for her cough is not working.  Please call patient back.

## 2020-08-02 NOTE — Telephone Encounter (Addendum)
Returned call to patient. States she has had a cough since receiving second covid vaccine on 05/30/2020. States no relief with albuterol, Delsym, Tessalon perles. States cough is worse at night. Has "phlegm that I can't bring up." Tested negative for Covid on 07/04/2020. First available appt given for tomorrow at 1:45 with [facility].

## 2020-08-03 ENCOUNTER — Encounter: Payer: Medicaid Other | Admitting: Student

## 2020-08-08 ENCOUNTER — Encounter (HOSPITAL_COMMUNITY): Payer: Self-pay | Admitting: Emergency Medicine

## 2020-08-08 ENCOUNTER — Inpatient Hospital Stay (HOSPITAL_COMMUNITY): Payer: Self-pay

## 2020-08-08 ENCOUNTER — Emergency Department (HOSPITAL_COMMUNITY): Payer: Self-pay

## 2020-08-08 ENCOUNTER — Inpatient Hospital Stay (HOSPITAL_COMMUNITY)
Admission: EM | Admit: 2020-08-08 | Discharge: 2020-08-12 | DRG: 175 | Disposition: A | Discharge status: Home / Self Care | Payer: Self-pay | Attending: Internal Medicine | Admitting: Internal Medicine

## 2020-08-08 ENCOUNTER — Other Ambulatory Visit: Payer: Self-pay

## 2020-08-08 DIAGNOSIS — R059 Cough, unspecified: Secondary | ICD-10-CM

## 2020-08-08 DIAGNOSIS — R000 Tachycardia, unspecified: Secondary | ICD-10-CM | POA: Diagnosis present

## 2020-08-08 DIAGNOSIS — D509 Iron deficiency anemia, unspecified: Secondary | ICD-10-CM | POA: Diagnosis present

## 2020-08-08 DIAGNOSIS — E876 Hypokalemia: Secondary | ICD-10-CM | POA: Diagnosis present

## 2020-08-08 DIAGNOSIS — Z86010 Personal history of colonic polyps: Secondary | ICD-10-CM | POA: Diagnosis present

## 2020-08-08 DIAGNOSIS — M62838 Other muscle spasm: Secondary | ICD-10-CM | POA: Diagnosis present

## 2020-08-08 DIAGNOSIS — Z20822 Contact with and (suspected) exposure to covid-19: Secondary | ICD-10-CM | POA: Diagnosis present

## 2020-08-08 DIAGNOSIS — R058 Other specified cough: Secondary | ICD-10-CM

## 2020-08-08 DIAGNOSIS — K644 Residual hemorrhoidal skin tags: Secondary | ICD-10-CM | POA: Diagnosis present

## 2020-08-08 DIAGNOSIS — D259 Leiomyoma of uterus, unspecified: Secondary | ICD-10-CM | POA: Diagnosis present

## 2020-08-08 DIAGNOSIS — Z91040 Latex allergy status: Secondary | ICD-10-CM

## 2020-08-08 DIAGNOSIS — K648 Other hemorrhoids: Secondary | ICD-10-CM | POA: Diagnosis present

## 2020-08-08 DIAGNOSIS — F419 Anxiety disorder, unspecified: Secondary | ICD-10-CM | POA: Diagnosis present

## 2020-08-08 DIAGNOSIS — E1165 Type 2 diabetes mellitus with hyperglycemia: Secondary | ICD-10-CM | POA: Diagnosis present

## 2020-08-08 DIAGNOSIS — F32A Depression, unspecified: Secondary | ICD-10-CM | POA: Diagnosis present

## 2020-08-08 DIAGNOSIS — E119 Type 2 diabetes mellitus without complications: Secondary | ICD-10-CM

## 2020-08-08 DIAGNOSIS — I2699 Other pulmonary embolism without acute cor pulmonale: Secondary | ICD-10-CM

## 2020-08-08 DIAGNOSIS — R739 Hyperglycemia, unspecified: Secondary | ICD-10-CM

## 2020-08-08 DIAGNOSIS — Z23 Encounter for immunization: Secondary | ICD-10-CM

## 2020-08-08 DIAGNOSIS — B379 Candidiasis, unspecified: Secondary | ICD-10-CM | POA: Diagnosis present

## 2020-08-08 DIAGNOSIS — D500 Iron deficiency anemia secondary to blood loss (chronic): Secondary | ICD-10-CM

## 2020-08-08 DIAGNOSIS — Z8249 Family history of ischemic heart disease and other diseases of the circulatory system: Secondary | ICD-10-CM

## 2020-08-08 DIAGNOSIS — K760 Fatty (change of) liver, not elsewhere classified: Secondary | ICD-10-CM | POA: Diagnosis present

## 2020-08-08 DIAGNOSIS — Z79899 Other long term (current) drug therapy: Secondary | ICD-10-CM

## 2020-08-08 DIAGNOSIS — B3731 Acute candidiasis of vulva and vagina: Secondary | ICD-10-CM

## 2020-08-08 DIAGNOSIS — Z825 Family history of asthma and other chronic lower respiratory diseases: Secondary | ICD-10-CM

## 2020-08-08 DIAGNOSIS — R0602 Shortness of breath: Secondary | ICD-10-CM

## 2020-08-08 DIAGNOSIS — I2609 Other pulmonary embolism with acute cor pulmonale: Principal | ICD-10-CM | POA: Diagnosis present

## 2020-08-08 DIAGNOSIS — R933 Abnormal findings on diagnostic imaging of other parts of digestive tract: Secondary | ICD-10-CM | POA: Diagnosis present

## 2020-08-08 DIAGNOSIS — Z794 Long term (current) use of insulin: Secondary | ICD-10-CM

## 2020-08-08 DIAGNOSIS — D649 Anemia, unspecified: Secondary | ICD-10-CM

## 2020-08-08 DIAGNOSIS — I10 Essential (primary) hypertension: Secondary | ICD-10-CM | POA: Diagnosis present

## 2020-08-08 DIAGNOSIS — Z86711 Personal history of pulmonary embolism: Secondary | ICD-10-CM | POA: Diagnosis present

## 2020-08-08 DIAGNOSIS — K921 Melena: Secondary | ICD-10-CM | POA: Diagnosis present

## 2020-08-08 DIAGNOSIS — K635 Polyp of colon: Secondary | ICD-10-CM | POA: Diagnosis present

## 2020-08-08 DIAGNOSIS — Z880 Allergy status to penicillin: Secondary | ICD-10-CM

## 2020-08-08 DIAGNOSIS — I517 Cardiomegaly: Secondary | ICD-10-CM | POA: Diagnosis present

## 2020-08-08 DIAGNOSIS — Z803 Family history of malignant neoplasm of breast: Secondary | ICD-10-CM

## 2020-08-08 DIAGNOSIS — Z823 Family history of stroke: Secondary | ICD-10-CM

## 2020-08-08 DIAGNOSIS — I5020 Unspecified systolic (congestive) heart failure: Secondary | ICD-10-CM | POA: Diagnosis present

## 2020-08-08 DIAGNOSIS — J189 Pneumonia, unspecified organism: Secondary | ICD-10-CM | POA: Diagnosis present

## 2020-08-08 DIAGNOSIS — G43909 Migraine, unspecified, not intractable, without status migrainosus: Secondary | ICD-10-CM | POA: Diagnosis present

## 2020-08-08 DIAGNOSIS — M1990 Unspecified osteoarthritis, unspecified site: Secondary | ICD-10-CM | POA: Diagnosis present

## 2020-08-08 DIAGNOSIS — Z7984 Long term (current) use of oral hypoglycemic drugs: Secondary | ICD-10-CM

## 2020-08-08 DIAGNOSIS — B373 Candidiasis of vulva and vagina: Secondary | ICD-10-CM

## 2020-08-08 DIAGNOSIS — Z833 Family history of diabetes mellitus: Secondary | ICD-10-CM

## 2020-08-08 DIAGNOSIS — J45909 Unspecified asthma, uncomplicated: Secondary | ICD-10-CM | POA: Diagnosis present

## 2020-08-08 DIAGNOSIS — N809 Endometriosis, unspecified: Secondary | ICD-10-CM | POA: Diagnosis present

## 2020-08-08 DIAGNOSIS — Z818 Family history of other mental and behavioral disorders: Secondary | ICD-10-CM

## 2020-08-08 DIAGNOSIS — R053 Chronic cough: Secondary | ICD-10-CM | POA: Diagnosis present

## 2020-08-08 DIAGNOSIS — D75839 Thrombocytosis, unspecified: Secondary | ICD-10-CM | POA: Diagnosis present

## 2020-08-08 DIAGNOSIS — Z800 Family history of malignant neoplasm of digestive organs: Secondary | ICD-10-CM

## 2020-08-08 DIAGNOSIS — K319 Disease of stomach and duodenum, unspecified: Secondary | ICD-10-CM | POA: Diagnosis present

## 2020-08-08 DIAGNOSIS — R634 Abnormal weight loss: Secondary | ICD-10-CM | POA: Diagnosis present

## 2020-08-08 DIAGNOSIS — Z87891 Personal history of nicotine dependence: Secondary | ICD-10-CM

## 2020-08-08 DIAGNOSIS — K602 Anal fissure, unspecified: Secondary | ICD-10-CM | POA: Diagnosis present

## 2020-08-08 LAB — CBC WITH DIFFERENTIAL/PLATELET
Abs Immature Granulocytes: 0.09 10*3/uL — ABNORMAL HIGH (ref 0.00–0.07)
Basophils Absolute: 0 10*3/uL (ref 0.0–0.1)
Basophils Relative: 0 %
Eosinophils Absolute: 0 10*3/uL (ref 0.0–0.5)
Eosinophils Relative: 0 %
HCT: 30.9 % — ABNORMAL LOW (ref 36.0–46.0)
Hemoglobin: 9.7 g/dL — ABNORMAL LOW (ref 12.0–15.0)
Immature Granulocytes: 1 %
Lymphocytes Relative: 17 %
Lymphs Abs: 2.6 10*3/uL (ref 0.7–4.0)
MCH: 25.1 pg — ABNORMAL LOW (ref 26.0–34.0)
MCHC: 31.4 g/dL (ref 30.0–36.0)
MCV: 79.8 fL — ABNORMAL LOW (ref 80.0–100.0)
Monocytes Absolute: 0.7 10*3/uL (ref 0.1–1.0)
Monocytes Relative: 5 %
Neutro Abs: 11.5 10*3/uL — ABNORMAL HIGH (ref 1.7–7.7)
Neutrophils Relative %: 77 %
Platelets: 358 10*3/uL (ref 150–400)
RBC: 3.87 MIL/uL (ref 3.87–5.11)
RDW: 14.5 % (ref 11.5–15.5)
WBC: 15 10*3/uL — ABNORMAL HIGH (ref 4.0–10.5)
nRBC: 0 % (ref 0.0–0.2)

## 2020-08-08 LAB — TROPONIN I (HIGH SENSITIVITY)
Troponin I (High Sensitivity): 16 ng/L (ref <18)
Troponin I (High Sensitivity): 11 ng/L

## 2020-08-08 LAB — CBG MONITORING, ED
Glucose-Capillary: 244 mg/dL — ABNORMAL HIGH (ref 70–99)
Glucose-Capillary: 274 mg/dL — ABNORMAL HIGH (ref 70–99)

## 2020-08-08 LAB — D-DIMER, QUANTITATIVE: D-Dimer, Quant: 2.19 ug/mL-FEU — ABNORMAL HIGH (ref 0.00–0.50)

## 2020-08-08 LAB — ECHOCARDIOGRAM COMPLETE
Area-P 1/2: 3.12 cm2
Height: 63 in
S' Lateral: 3.3 cm
Single Plane A4C EF: 49.7 %
Weight: 2892.44 oz

## 2020-08-08 LAB — BASIC METABOLIC PANEL
Anion gap: 11 (ref 5–15)
BUN: <5 mg/dL — ABNORMAL LOW (ref 6–20)
CO2: 24 mmol/L (ref 22–32)
Calcium: 8.8 mg/dL — ABNORMAL LOW (ref 8.9–10.3)
Chloride: 99 mmol/L (ref 98–111)
Creatinine, Ser: 0.69 mg/dL (ref 0.44–1.00)
GFR, Estimated: >60 mL/min (ref >60)
Glucose, Bld: 308 mg/dL — ABNORMAL HIGH (ref 70–99)
Potassium: 2.9 mmol/L — ABNORMAL LOW (ref 3.5–5.1)
Sodium: 134 mmol/L — ABNORMAL LOW (ref 135–145)

## 2020-08-08 LAB — LACTIC ACID, PLASMA
Lactic Acid, Venous: 1.6 mmol/L (ref 0.5–1.9)
Lactic Acid, Venous: 1.3 mmol/L (ref 0.5–1.9)

## 2020-08-08 LAB — PROCALCITONIN: Procalcitonin: 0.3 ng/mL

## 2020-08-08 LAB — GLUCOSE, CAPILLARY
Glucose-Capillary: 289 mg/dL — ABNORMAL HIGH (ref 70–99)
Glucose-Capillary: 258 mg/dL — ABNORMAL HIGH (ref 70–99)

## 2020-08-08 LAB — RESP PANEL BY RT-PCR (FLU A&B, COVID) ARPGX2
Influenza A by PCR: NEGATIVE
Influenza B by PCR: NEGATIVE
SARS Coronavirus 2 by RT PCR: NEGATIVE

## 2020-08-08 LAB — MAGNESIUM: Magnesium: 1.6 mg/dL — ABNORMAL LOW (ref 1.7–2.4)

## 2020-08-08 LAB — HEPARIN LEVEL (UNFRACTIONATED)
Heparin Unfractionated: 0.22 IU/mL — ABNORMAL LOW (ref 0.30–0.70)
Heparin Unfractionated: 0.48 IU/mL (ref 0.30–0.70)

## 2020-08-08 LAB — APTT: aPTT: 24 seconds (ref 24–36)

## 2020-08-08 LAB — PROTIME-INR
INR: 1.2 (ref 0.8–1.2)
Prothrombin Time: 15.1 seconds (ref 11.4–15.2)

## 2020-08-08 LAB — BRAIN NATRIURETIC PEPTIDE: B Natriuretic Peptide: 137.9 pg/mL — ABNORMAL HIGH (ref 0.0–100.0)

## 2020-08-08 MED ORDER — POTASSIUM CHLORIDE CRYS ER 20 MEQ PO TBCR
40.0000 meq | EXTENDED_RELEASE_TABLET | Freq: Once | ORAL | Status: AC
  Administered 2020-08-08: 40 meq via ORAL
  Filled 2020-08-08: qty 2

## 2020-08-08 MED ORDER — INSULIN ASPART 100 UNIT/ML ~~LOC~~ SOLN
0.0000 [IU] | Freq: Three times a day (TID) | SUBCUTANEOUS | Status: DC
  Administered 2020-08-08: 5 [IU] via SUBCUTANEOUS
  Administered 2020-08-08: 3 [IU] via SUBCUTANEOUS

## 2020-08-08 MED ORDER — AZITHROMYCIN 250 MG PO TABS
250.0000 mg | ORAL_TABLET | Freq: Every day | ORAL | Status: DC
  Administered 2020-08-09 – 2020-08-10 (×2): 250 mg via ORAL
  Filled 2020-08-08 (×2): qty 1

## 2020-08-08 MED ORDER — SODIUM CHLORIDE 0.9 % IV SOLN
500.0000 mg | Freq: Once | INTRAVENOUS | Status: AC
  Administered 2020-08-08: 500 mg via INTRAVENOUS
  Filled 2020-08-08: qty 500

## 2020-08-08 MED ORDER — FENTANYL CITRATE (PF) 100 MCG/2ML IJ SOLN
50.0000 ug | Freq: Once | INTRAMUSCULAR | Status: AC
Start: 2020-08-08 — End: 2020-08-08
  Administered 2020-08-08: 50 ug via INTRAVENOUS
  Filled 2020-08-08: qty 2

## 2020-08-08 MED ORDER — ACETAMINOPHEN 650 MG RE SUPP: 650.0000 mg | Freq: Four times a day (QID) | RECTAL | Status: DC | PRN

## 2020-08-08 MED ORDER — ARIPIPRAZOLE 5 MG PO TABS
5.0000 mg | ORAL_TABLET | Freq: Every day | ORAL | Status: DC
  Administered 2020-08-08 – 2020-08-12 (×5): 5 mg via ORAL
  Filled 2020-08-08 (×5): qty 1

## 2020-08-08 MED ORDER — BENZONATATE 100 MG PO CAPS
100.0000 mg | ORAL_CAPSULE | Freq: Three times a day (TID) | ORAL | Status: DC | PRN
  Administered 2020-08-08 – 2020-08-09 (×2): 100 mg via ORAL
  Filled 2020-08-08 (×2): qty 1

## 2020-08-08 MED ORDER — ALBUTEROL SULFATE HFA 108 (90 BASE) MCG/ACT IN AERS
2.0000 | INHALATION_SPRAY | Freq: Four times a day (QID) | RESPIRATORY_TRACT | Status: DC | PRN
  Filled 2020-08-08 (×2): qty 6.7

## 2020-08-08 MED ORDER — HYDROCODONE-ACETAMINOPHEN 5-325 MG PO TABS
1.0000 | ORAL_TABLET | Freq: Once | ORAL | Status: AC
Start: 2020-08-08 — End: 2020-08-08
  Administered 2020-08-08: 1 via ORAL
  Filled 2020-08-08: qty 1

## 2020-08-08 MED ORDER — INSULIN GLARGINE 100 UNIT/ML ~~LOC~~ SOLN
12.0000 [IU] | Freq: Every day | SUBCUTANEOUS | Status: DC
  Administered 2020-08-09: 12 [IU] via SUBCUTANEOUS
  Filled 2020-08-08 (×2): qty 0.12

## 2020-08-08 MED ORDER — INSULIN ASPART 100 UNIT/ML ~~LOC~~ SOLN
0.0000 [IU] | Freq: Three times a day (TID) | SUBCUTANEOUS | Status: DC
  Administered 2020-08-08 – 2020-08-09 (×3): 8 [IU] via SUBCUTANEOUS
  Administered 2020-08-09: 11 [IU] via SUBCUTANEOUS

## 2020-08-08 MED ORDER — HEPARIN BOLUS VIA INFUSION
4000.0000 [IU] | Freq: Once | INTRAVENOUS | Status: AC
  Administered 2020-08-08: 4000 [IU] via INTRAVENOUS
  Filled 2020-08-08: qty 4000

## 2020-08-08 MED ORDER — KETOROLAC TROMETHAMINE 30 MG/ML IJ SOLN
30.0000 mg | Freq: Once | INTRAMUSCULAR | Status: AC
  Administered 2020-08-08: 30 mg via INTRAVENOUS
  Filled 2020-08-08: qty 1

## 2020-08-08 MED ORDER — BUSPIRONE HCL 10 MG PO TABS
10.0000 mg | ORAL_TABLET | Freq: Three times a day (TID) | ORAL | Status: DC
  Administered 2020-08-08 – 2020-08-12 (×13): 10 mg via ORAL
  Filled 2020-08-08 (×13): qty 1

## 2020-08-08 MED ORDER — MAGNESIUM SULFATE IN D5W 1-5 GM/100ML-% IV SOLN
1.0000 g | Freq: Once | INTRAVENOUS | Status: AC
  Administered 2020-08-08: 1 g via INTRAVENOUS
  Filled 2020-08-08: qty 100

## 2020-08-08 MED ORDER — SODIUM CHLORIDE 0.9 % IV SOLN
1.0000 g | INTRAVENOUS | Status: DC
  Administered 2020-08-09 – 2020-08-10 (×2): 1 g via INTRAVENOUS
  Filled 2020-08-08 (×2): qty 1

## 2020-08-08 MED ORDER — ACETAMINOPHEN 325 MG PO TABS
650.0000 mg | ORAL_TABLET | Freq: Four times a day (QID) | ORAL | Status: DC | PRN
  Administered 2020-08-09: 650 mg via ORAL
  Filled 2020-08-08: qty 2

## 2020-08-08 MED ORDER — SODIUM CHLORIDE 0.9 % IV BOLUS
1000.0000 mL | Freq: Once | INTRAVENOUS | Status: AC
  Administered 2020-08-08: 1000 mL via INTRAVENOUS

## 2020-08-08 MED ORDER — SODIUM CHLORIDE 0.9 % IV SOLN
1.0000 g | Freq: Once | INTRAVENOUS | Status: AC
  Administered 2020-08-08: 1 g via INTRAVENOUS
  Filled 2020-08-08: qty 10

## 2020-08-08 MED ORDER — INSULIN ASPART 100 UNIT/ML ~~LOC~~ SOLN: 0.0000 [IU] | Freq: Every day | SUBCUTANEOUS | Status: DC

## 2020-08-08 MED ORDER — HEPARIN (PORCINE) 25000 UT/250ML-% IV SOLN
1300.0000 [IU]/h | INTRAVENOUS | Status: AC
  Administered 2020-08-08: 1500 [IU]/h via INTRAVENOUS
  Administered 2020-08-08: 1200 [IU]/h via INTRAVENOUS
  Administered 2020-08-10 (×2): 1550 [IU]/h via INTRAVENOUS
  Administered 2020-08-11: 1300 [IU]/h via INTRAVENOUS
  Filled 2020-08-08 (×5): qty 250

## 2020-08-08 MED ORDER — IOHEXOL 350 MG/ML SOLN
100.0000 mL | Freq: Once | INTRAVENOUS | Status: AC | PRN
  Administered 2020-08-08: 70 mL via INTRAVENOUS

## 2020-08-08 NOTE — Progress Notes (Signed)
[place] for Heparin Indication: pulmonary embolus  Labs: Recent Labs    08/08/20 0135 08/08/20 0518 08/08/20 0600 08/08/20 0757 08/08/20 1405 08/08/20 2216  HGB 9.7*  --   --   --   --   --   HCT 30.9*  --   --   --   --   --   PLT 358  --   --   --   --   --   APTT  --  24  --   --   --   --   LABPROT  --  15.1  --   --   --   --   INR  --  1.2  --   --   --   --   HEPARINUNFRC  --   --   --   --  0.22* 0.48  CREATININE 0.69  --   --   --   --   --   TROPONINIHS  --   --  16 11  --   --     Assessment: 40 y.o. F presents with CP. Found to have PE on CT. To begin heparin per pharmacy. No AC PTA. CBC ok on admission.  Heparin level 0.48 units/ml  Goal of Therapy:  Heparin level 0.3-0.7 units/ml Monitor platelets by anticoagulation protocol: Yes   Plan:  Continue heparin to 1500 units / hr Daily heparin level and CBC  Thank you Excell Seltzer, PharmD  08/08/2020,11:08 PM

## 2020-08-08 NOTE — Hospital Course (Addendum)
Admitted 08/08/2020  Allergies: Mushroom extract complex, Latex, and Penicillins Pertinent Hx: IDDM, asthma, bronchitis   40 y.o. female p/w cough x 2 months and worsening of SOB  * Extensive bilateral PE: HD stable. On RA. At least submassive PE w rt heart strain: Has high sPESI score. Trop and echo ordered. COVID-19 not checked yet. On IV Hep. F/u labs. If concenring, will consider PCCM consult.   *Probable PNA: CTA chest finding w  *HTN: Holding antiHTN meds given   normotension and in setting of PE.  Consults: none Meds: IV hep, lantus, SSI, VTE ppx: IV Hep IVF: none Diet: HHCM  * Dana Hughes *  []  f/u echo, trop []  low threshold to consult PCCM  O/N events:

## 2020-08-08 NOTE — ED Notes (Signed)
Patient transported to CT 

## 2020-08-08 NOTE — Progress Notes (Signed)
  Echocardiogram 2D Echocardiogram has been performed.  Johny Chess 08/08/2020, 11:05 AM

## 2020-08-08 NOTE — ED Triage Notes (Signed)
Patient reports cough with sneezing and pain at right anterior chest when coughing for several days , respirations unlabored , denies fever or chills/no emesis .

## 2020-08-08 NOTE — Progress Notes (Signed)
[place] for Heparin Indication: pulmonary embolus  Allergies  Allergen Reactions  . Mushroom Extract Complex Anaphylaxis  . Latex Itching and Swelling  . Penicillins Itching    Has patient had a PCN reaction causing immediate rash, facial/tongue/throat swelling, SOB or lightheadedness with hypotension: No Has patient had a PCN reaction causing severe rash involving mucus membranes or skin necrosis: No Has patient had a PCN reaction that required hospitalization No Has patient had a PCN reaction occurring within the last 10 years: No If all of the above answers are "NO", then may proceed with Cephalosporin use.     Patient Measurements: Height: 5\' 3"  (160 cm) Weight: 82 kg (180 lb 12.4 oz) IBW/kg (Calculated) : 52.4 Heparin Dosing Weight: 70 kg  Vital Signs: BP: 97/74 (04/18 1345) Pulse Rate: 85 (04/18 1345)  Labs: Recent Labs    08/08/20 0135 08/08/20 0518 08/08/20 0600 08/08/20 0757 08/08/20 1405  HGB 9.7*  --   --   --   --   HCT 30.9*  --   --   --   --   PLT 358  --   --   --   --   APTT  --  24  --   --   --   LABPROT  --  15.1  --   --   --   INR  --  1.2  --   --   --   HEPARINUNFRC  --   --   --   --  0.22*  CREATININE 0.69  --   --   --   --   TROPONINIHS  --   --  16 11  --     Estimated Creatinine Clearance: 95.7 mL/min (by C-G formula based on SCr of 0.69 mg/dL).  Assessment: 40 y.o. F presents with CP. Found to have PE on CT. To begin heparin per pharmacy. No AC PTA. CBC ok on admission.  Initial heparin level subtherapeutic at 0.22  Goal of Therapy:  Heparin level 0.3-0.7 units/ml Monitor platelets by anticoagulation protocol: Yes   Plan:  Increase heparin to 1500 units / hr Heparin level in 6 hours Daily heparin level and CBC  Thank you Anette Guarneri, PharmD Please see amion for complete clinical pharmacist phone list 08/08/2020,3:15 PM

## 2020-08-08 NOTE — Progress Notes (Signed)
VASCULAR LAB    Bilateral lower extremity venous duplex has been performed.  See CV proc for preliminary results.   Nedra Mcinnis, RVT 08/08/2020, 10:59 AM

## 2020-08-08 NOTE — ED Provider Notes (Signed)
[facility] EMERGENCY DEPARTMENT Provider Note  CSN: [id number] Arrival date & time: 08/08/20 0013    History Chief Complaint  Patient presents with  . Cough    HPI  Dana Hughes is a 40 y.o. female with history of asthma, bronchitis presents for evaluation of cough for about 2 months since she got a Covid vaccine. Cough is mostly dry but occasionally coughs up some phlegm. She has felt some SOB at times improved with her inhaler/neb. She reports sharp lower chest wall pain with coughing and deep breath. No fevers. She has been seen at PCP and UC for symptoms over the last 2 months, Covid testing has been neg x 2. CXR has been clear. She has been taking OTC cough medications as well as Tessalon without improvement. She has poorly controlled DM. No longer smokes.    Past Medical History:  Diagnosis Date  . Anxiety   . Arthritis   . Asthma   . Bronchitis   . BV (bacterial vaginosis)   . Candidiasis   . Depression   . Diabetes mellitus without complication ([place])   . Endometriosis   . Headache(784.0)    migraines  . Hypertension    no longer takes meds  . Migraines   . Muscle spasm   . Ovarian cyst   . Pelvic pain in female 09/29/2015  . Scoliosis   . Uterine fibroid     Past Surgical History:  Procedure Laterality Date  . CHOLECYSTECTOMY    . TUBAL LIGATION      Family History  Problem Relation Age of Onset  . Cancer Father   . Hypertension Maternal Aunt   . Breast cancer Maternal Aunt   . Cancer Paternal Grandmother   . Breast cancer Paternal Grandmother   . Breast cancer Paternal Aunt     Social History   Tobacco Use  . Smoking status: Former Research scientist (life sciences)  . Smokeless tobacco: Never Used  Vaping Use  . Vaping Use: Never used  Substance Use Topics  . Alcohol use: Not Currently  . Drug use: Yes    Frequency: 1.0 times per week    Types: Marijuana     Home Medications Prior to Admission medications   Medication Sig Start Date End Date Taking?  Authorizing Provider  albuterol (PROVENTIL HFA;VENTOLIN HFA) 108 (90 Base) MCG/ACT inhaler Inhale 2 puffs into the lungs every 6 (six) hours as needed for wheezing or shortness of breath. Reported on 09/29/2015   Yes [provider]  albuterol (PROVENTIL) (2.5 MG/3ML) 0.083% nebulizer solution Take 3 mLs (2.5 mg total) by nebulization every 6 (six) hours as needed for wheezing or shortness of breath. 07/02/18  Yes Wurst, [place], PA-C  AMBULATORY NON FORMULARY MEDICATION Nitroglycerin 0.125% gel apply a small amount to the rectum 3 times daily x 6-8 weeks. 06/14/20  Yes Thornton Park, MD  AMBULATORY NON FORMULARY MEDICATION Medication Name: nitroglycerin 0.125 % gel applied to the rectum TID FOR 6 WEEKS 6-8 WEEKS ( NOT TO BE USED WITH CONCURRENT PHOSPHODIESTERASE INHIBITORS). 06/22/20  Yes Thornton Park, MD  amLODipine (NORVASC) 5 MG tablet Take 1 tablet (5 mg total) by mouth daily. 02/19/20  Yes Bloomfield, Carley D, DO  ARIPiprazole (ABILIFY) 5 MG tablet Take 1 tablet (5 mg total) by mouth daily. 06/03/20  Yes Eulis Canner E, NP  artificial tears (LACRILUBE) OINT ophthalmic ointment Place into both eyes at bedtime as needed for dry eyes. 05/02/18  Yes Murray, Alyssa B, PA-C  benzonatate (TESSALON) 100 MG  capsule Take 1 capsule (100 mg total) by mouth 3 (three) times daily as needed for cough. 07/06/20  Yes Sanjuan Dame, MD  busPIRone (BUSPAR) 10 MG tablet Take 1 tablet (10 mg total) by mouth 3 (three) times daily. 06/03/20  Yes Eulis Canner E, NP  dicyclomine (BENTYL) 10 MG capsule Take 1 capsule (10 mg total) by mouth 4 (four) times daily as needed for spasms (abdominal pain). 06/20/20  Yes Jose Persia, MD  Exenatide ER (BYDUREON BCISE) 2 MG/0.85ML AUIJ Inject 2 mg into the skin once a week. 07/19/20  Yes Sanjuan Dame, MD  fluticasone (FLONASE) 50 MCG/ACT nasal spray Place 1 spray into both nostrils in the morning and at bedtime. Patient taking differently: Place 1 spray  into both nostrils daily as needed. 06/20/20  Yes Jose Persia, MD  glucose blood test strip Check your sugar in the morning before you eat breakfast, and one hour after a meal. 11/12/19  Yes Melynda Ripple, MD  glucose monitoring kit (FREESTYLE) monitoring kit 1 each by Does not apply route daily. Check glucose once in the morning before breakfast and 1 hour after a meal 11/12/19  Yes Melynda Ripple, MD  guaifenesin (ROBITUSSIN) 100 MG/5ML syrup Take 200 mg by mouth 3 (three) times daily as needed for cough.   Yes [provider]  hydrOXYzine (ATARAX/VISTARIL) 10 MG tablet Take 1 tablet (10 mg total) by mouth 3 (three) times daily as needed. Patient taking differently: Take 10 mg by mouth 3 (three) times daily as needed for anxiety. 06/03/20  Yes Eulis Canner E, NP  insulin aspart (NOVOLOG) 100 UNIT/ML FlexPen Inject 10 Units into the skin 2 (two) times daily with a meal. 06/03/20  Yes Jose Persia, MD  insulin glargine (LANTUS SOLOSTAR) 100 UNIT/ML Solostar Pen Inject 20 Units into the skin daily at 10 pm. Patient taking differently: Inject 20 Units into the skin at bedtime. 06/03/20  Yes Jose Persia, MD  Insulin Pen Needle (UNIFINE PEN NEEDLES) 32G X 4 MM MISC 1 application by Does not apply route in the morning, at noon, and at bedtime. 02/19/20  Yes Bloomfield, Carley D, DO  meclizine (ANTIVERT) 25 MG tablet Take 1 tablet (25 mg total) by mouth 3 (three) times daily as needed for dizziness. 05/06/18  Yes Melynda Ripple, MD  metFORMIN (GLUCOPHAGE) 1000 MG tablet Take 1 tablet (1,000 mg total) by mouth 2 (two) times daily with a meal. 12/09/19 12/08/20 Yes Masoudi, Elhamalsadat, MD  mirtazapine (REMERON) 30 MG tablet Take 1 tablet (30 mg total) by mouth at bedtime. 06/03/20  Yes Eulis Canner E, NP  omeprazole (PRILOSEC) 20 MG capsule Take 1 capsule (20 mg total) by mouth daily before breakfast. 05/25/20  Yes Jose Persia, MD  Polysaccharide Iron Complex 150 MG TABS Take  150 mg by mouth daily. 04/07/20  Yes Sanjuan Dame, MD  Pseudoeph-Doxylamine-DM-APAP (NYQUIL PO) Take 1 Dose by mouth at bedtime as needed (cold/cough).   Yes [provider]  Pseudoephedrine-APAP-DM (DAYQUIL PO) Take 1 Dose by mouth daily as needed (cold/cough).   Yes [provider]  Dextromethorphan-guaiFENesin (DELSYM COUGH/CHEST CONGEST DM) 5-100 MG/5ML LIQD Take 10 mLs by mouth 2 (two) times daily. Patient not taking: No sig reported 07/04/20   Sanjuan Dame, MD  diclofenac Sodium (VOLTAREN) 1 % GEL Apply 2 g topically 4 (four) times daily. Patient not taking: No sig reported 12/09/19   Masoudi, Dorthula Rue, MD  megestrol (MEGACE) 40 MG tablet Take 1 tablet (40 mg total) by mouth 2 (two) times daily.  Can increase to two tablets twice a day in the event of heavy bleeding Patient not taking: No sig reported 05/05/20   Chancy Milroy, MD     Allergies    Mushroom extract complex, Latex, and Penicillins   Review of Systems   Review of Systems A comprehensive review of systems was completed and negative except as noted in HPI.    Physical Exam BP 101/82   Pulse (!) 103   Temp 98.9 F (37.2 C) (Oral)   Resp 17   Ht 5' 3"  (1.6 m)   Wt 82 kg   SpO2 98%   BMI 32.02 kg/m   Physical Exam Vitals and nursing note reviewed.  Constitutional:      Appearance: Normal appearance.  HENT:     Head: Normocephalic and atraumatic.     Nose: Nose normal.     Mouth/Throat:     Mouth: Mucous membranes are moist.  Eyes:     Extraocular Movements: Extraocular movements intact.     Conjunctiva/sclera: Conjunctivae normal.  Cardiovascular:     Rate and Rhythm: Tachycardia present.  Pulmonary:     Effort: Pulmonary effort is normal.     Breath sounds: Rhonchi present.  Chest:     Chest wall: Tenderness present.  Abdominal:     General: Abdomen is flat.     Palpations: Abdomen is soft.     Tenderness: There is no abdominal tenderness.  Musculoskeletal:         General: No swelling. Normal range of motion.     Cervical back: Neck supple.  Skin:    General: Skin is warm and dry.  Neurological:     General: No focal deficit present.     Mental Status: She is alert.  Psychiatric:        Mood and Affect: Mood normal.      ED Results / Procedures / Treatments   Labs (all labs ordered are listed, but only abnormal results are displayed) Labs Reviewed  BASIC METABOLIC PANEL - Abnormal; Notable for the following components:      Result Value   Sodium 134 (*)    Potassium 2.9 (*)    Glucose, Bld 308 (*)    BUN <5 (*)    Calcium 8.8 (*)    All other components within normal limits  CBC WITH DIFFERENTIAL/PLATELET - Abnormal; Notable for the following components:   WBC 15.0 (*)    Hemoglobin 9.7 (*)    HCT 30.9 (*)    MCV 79.8 (*)    MCH 25.1 (*)    Neutro Abs 11.5 (*)    Abs Immature Granulocytes 0.09 (*)    All other components within normal limits  D-DIMER, QUANTITATIVE - Abnormal; Notable for the following components:   D-Dimer, Quant 2.19 (*)    All other components within normal limits  CULTURE, BLOOD (ROUTINE X 2)  CULTURE, BLOOD (ROUTINE X 2)  PROTIME-INR  APTT  HEPARIN LEVEL (UNFRACTIONATED)  MAGNESIUM  LACTIC ACID, PLASMA  LACTIC ACID, PLASMA  BRAIN NATRIURETIC PEPTIDE    EKG EKG Interpretation  Date/Time:  Monday August 08 2020 00:19:11 EDT Ventricular Rate:  123 PR Interval:  96 QRS Duration: 86 QT Interval:  438 QTC Calculation: 627 R Axis:   -79 Text Interpretation: Sinus tachycardia with short PR Left axis deviation Abnormal ECG Since last tracing Rate faster T wave inversion in III is now flat Confirmed by Calvert Cantor [phone]) on 08/08/2020 1:25:01 AM   Radiology DG Chest  2 View  Result Date: 08/08/2020 CLINICAL DATA:  Cough EXAM: CHEST - 2 VIEW COMPARISON:  07/03/2020 FINDINGS: The heart size and mediastinal contours are within normal limits. Both lungs are clear. The visualized skeletal structures are  unremarkable. IMPRESSION: Normal study. Electronically Signed   By: Rolm Baptise M.D.   On: 08/08/2020 01:59   CT Angio Chest PE W/Cm &/Or Wo Cm  Result Date: 08/08/2020 CLINICAL DATA:  Months of cough, now with developing chest pain, positive D-dimer EXAM: CT ANGIOGRAPHY CHEST WITH CONTRAST TECHNIQUE: Multidetector CT imaging of the chest was performed using the standard protocol during bolus administration of intravenous contrast. Multiplanar CT image reconstructions and MIPs were obtained to evaluate the vascular anatomy. CONTRAST:  71m OMNIPAQUE IOHEXOL 350 MG/ML SOLN COMPARISON:  Radiograph 08/08/2020 FINDINGS: Cardiovascular: Satisfactory opacification of pulmonary arteries. Bilateral hypodense pulmonary artery emboli are seen extending throughout the left upper and lower lobar arteries and throughout the segmental and subsegmental branches of the upper, lower lobe and lingula. Filling defect in the right lung extends from the distal right main and interlobar pulmonary arteries throughout the lobar, segmental and subsegmental branches of the right upper, middle and lower lobe. There is central pulmonary artery enlargement as well as flattening of the intraventricular septum and elevated RV/LV ratio of approximately 1.2. Cardiac size remains within top normal limits. No pericardial effusion. The aortic root is suboptimally assessed given cardiac pulsation artifact. The aorta is normal caliber. No acute luminal abnormality of the imaged aorta. No periaortic stranding or hemorrhage. Normal 3 vessel branching of the aortic arch. Proximal great vessels are unremarkable. No major venous abnormalities. Mediastinum/Nodes: No mediastinal fluid or gas. Normal thyroid gland and thoracic inlet. No acute abnormality of the trachea or esophagus. No worrisome mediastinal, hilar or axillary adenopathy. Lungs/Pleura: Patchy multifocal areas of ground-glass opacity are present predominantly throughout the right lobe though  minimally in the left upper lobe as well. Largely regions demonstrate some subpleural sparing though more peripheral opacity is seen elsewhere particular in the upper and lower lobes. Minimal septal thickening is present as well. No pneumothorax. No layering effusion. No concerning pulmonary nodules or masses. Upper Abdomen: No acute abnormalities present in the visualized portions of the upper abdomen. Musculoskeletal: Multilevel degenerative changes are present in the imaged portions of the spine. No acute osseous abnormality or suspicious osseous lesion. No worrisome chest wall mass or lesion. Review of the MIP images confirms the above findings. IMPRESSION: 1. Extensive bilateral pulmonary artery emboli involving the distal right main and interlobar pulmonary arteries as well as throughout the lobar, segmental and subsegmental branches of both the right and left lung. Positive for acute PE with CT evidence of right heart strain (RV/LV Ratio 1.2) consistent with at least submassive (intermediate risk) PE. The presence of right heart strain has been associated with an increased risk of morbidity and mortality. 2. Patchy multifocal areas of ground-glass opacity predominantly throughout the right lobe though minimally in the left upper lobe as well. Findings are favored to reflect an infectious or inflammatory process with developing pulmonary infarct less favored given sub pleural regions of spared parenchyma though not fully excluded particularly in the more peripheral areas of opacity. Critical Value/emergent results were called by telephone at the time of interpretation on 08/08/2020 at 5:24 am [facility], who verbally acknowledged these results. Electronically Signed   By: PLovena LeM.D.   On: 08/08/2020 05:25    Procedures .Critical Care Performed by: STruddie Hidden MD Authorized  by: Truddie Hidden, MD   Critical care provider statement:    Critical care time (minutes):   45   Critical care time was exclusive of:  Separately billable procedures and treating other patients   Critical care was necessary to treat or prevent imminent or life-threatening deterioration of the following conditions:  Respiratory failure and circulatory failure   Critical care was time spent personally by me on the following activities:  Discussions with consultants, evaluation of patient's response to treatment, examination of patient, ordering and performing treatments and interventions, ordering and review of laboratory studies, ordering and review of radiographic studies, pulse oximetry, re-evaluation of patient's condition, obtaining history from patient or surrogate and review of old charts   Care discussed with: admitting provider      Medications Ordered in the ED Medications  cefTRIAXone (ROCEPHIN) 1 g in sodium chloride 0.9 % 100 mL IVPB (has no administration in time range)  azithromycin (ZITHROMAX) 500 mg in sodium chloride 0.9 % 250 mL IVPB (has no administration in time range)  heparin ADULT infusion 100 units/mL (25000 units/286m) (1,200 Units/hr Intravenous New Bag/Given 08/08/20 0538)  ketorolac (TORADOL) 30 MG/ML injection 30 mg (30 mg Intravenous Given 08/08/20 0218)  sodium chloride 0.9 % bolus 1,000 mL (0 mLs Intravenous Stopped 08/08/20 0315)  potassium chloride SA (KLOR-CON) CR tablet 40 mEq (40 mEq Oral Given 08/08/20 0317)  HYDROcodone-acetaminophen (NORCO/VICODIN) 5-325 MG per tablet 1 tablet (1 tablet Oral Given 08/08/20 0325)  iohexol (OMNIPAQUE) 350 MG/ML injection 100 mL (70 mLs Intravenous Contrast Given 08/08/20 0508)  fentaNYL (SUBLIMAZE) injection 50 mcg (50 mcg Intravenous Given 08/08/20 0543)  heparin bolus via infusion 4,000 Units (4,000 Units Intravenous Bolus from Bag 08/08/20 0539)     MDM Rules/Calculators/A&P MDM EKG with tachycardia, consider PE as a cause of her symptoms. SpO2 is normal will send dimer. Check basic labs and CXR. Overall, she is  well appearing and non-toxic in no distress.   ED Course  I have reviewed the triage vital signs and the nursing notes.  Pertinent labs & imaging results that were available during my care of the patient were reviewed by me and considered in my medical decision making (see chart for details).  Clinical Course as of 08/08/20 0552  Mon Aug 08, 2020  0250 CBC with elevated WBC and mild anemia.  [CS]  06578Dimer is elevated, will send for CTA.  [CS]  0307 BMP shows hyperglycemia and hypokalemia, Will recheck after IVF. Replete K orally.  [CS]  [phone] CTA with Radiologist, there is substantial PE clot burden as well as signs of infiltrates/infarct. Will begin Heparin, Abx for PNA and consult IM Teaching service for admission.  [CS]  [phone] with IM resident who will evaluate for admission.  [CS]    Clinical Course User Index [CS] STruddie Hidden MD    Final Clinical Impression(s) / ED Diagnoses Final diagnoses:  Cough  Hypokalemia  Hyperglycemia  Acute pulmonary embolism with acute cor pulmonale, unspecified pulmonary embolism type ([facility]  Community acquired pneumonia, unspecified laterality    Rx / DC Orders ED Discharge Orders    None       STruddie Hidden MD 08/08/20 [phone]

## 2020-08-08 NOTE — Progress Notes (Signed)
ANTICOAGULATION CONSULT NOTE - Initial Consult  Pharmacy Consult for Heparin Indication: pulmonary embolus  Allergies  Allergen Reactions  . Mushroom Extract Complex Anaphylaxis  . Latex Itching and Swelling  . Penicillins Itching    Has patient had a PCN reaction causing immediate rash, facial/tongue/throat swelling, SOB or lightheadedness with hypotension: No Has patient had a PCN reaction causing severe rash involving mucus membranes or skin necrosis: No Has patient had a PCN reaction that required hospitalization No Has patient had a PCN reaction occurring within the last 10 years: No If all of the above answers are "NO", then may proceed with Cephalosporin use.     Patient Measurements: Height: 5\' 3"  (160 cm) Weight: 82 kg (180 lb 12.4 oz) IBW/kg (Calculated) : 52.4 Heparin Dosing Weight: 70 kg  Vital Signs: Temp: 98.9 F (37.2 C) (04/18 0019) Temp Source: Oral (04/18 0019) BP: 101/82 (04/18 0415) Pulse Rate: 103 (04/18 0415)  Labs: Recent Labs    08/08/20 0135  HGB 9.7*  HCT 30.9*  PLT 358  CREATININE 0.69    Estimated Creatinine Clearance: 95.7 mL/min (by C-G formula based on SCr of 0.69 mg/dL).   Medical History: Past Medical History:  Diagnosis Date  . Anxiety   . Arthritis   . Asthma   . Bronchitis   . BV (bacterial vaginosis)   . Candidiasis   . Depression   . Diabetes mellitus without complication ([name])   . Endometriosis   . Headache(784.0)    migraines  . Hypertension    no longer takes meds  . Migraines   . Muscle spasm   . Ovarian cyst   . Pelvic pain in female 09/29/2015  . Scoliosis   . Uterine fibroid     Medications:  See electronic med rec  Assessment: 40 y.o. F presents with CP. Found to have PE on CT. To begin heparin per pharmacy. No AC PTA. CBC ok on admission.  Goal of Therapy:  Heparin level 0.3-0.7 units/ml Monitor platelets by anticoagulation protocol: Yes   Plan:  Heparin IV bolus 4000 units Heparin gtt at 1200  units/hr Will f/u heparin level in 6 hours Daily heparin level and CBC  Sherlon Handing, PharmD, BCPS Please see amion for complete clinical pharmacist phone list 08/08/2020,5:23 AM

## 2020-08-08 NOTE — Progress Notes (Addendum)
Inpatient Diabetes Program Recommendations  AACE/ADA: New Consensus Statement on Inpatient Glycemic Control (2015)  Target Ranges:  Prepandial:   less than 140 mg/dL      Peak postprandial:   less than 180 mg/dL (1-2 hours)      Critically ill patients:  140 - 180 mg/dL   Lab Results  Component Value Date   GLUCAP 244 (H) 08/08/2020   HGBA1C 10.8 (A) 05/25/2020    Review of Glycemic Control Results for GLAYDS, [employer] (MRN [id number]) as of 08/08/2020 09:35  Ref. Range 08/08/2020 07:57  Glucose-Capillary Latest Ref Range: 70 - 99 mg/dL 244 (H)   Diabetes history: DM 2 Outpatient Diabetes medications: Lantus 20 units, Novolog 10 units bid, Metformin 1000 mg bid, recently prescribed [place] 2 mg weekly last PCP visit (unsure if pt got medication) Current orders for Inpatient glycemic control: Lantus 12 units qhs, Novolog 0-9 units tid + hs  Inpatient Diabetes Program Recommendations:    A1c 10.8% on 2/2 at PCP visit at [facility] had ran out of insulin prior to this A1c draw   Pt reporting hyperglycemia at outpatient visits, basal insulin schedule for tonight.  -  Schedule insulin for Daily dose to get this am -  Increase Lantus to home dose (in preparation for outpatient to see if dose needs titration prior to d/c) -  Increase Novolog Correction to "moderate" 0-15 units tid tid   Addendum 1120 am: Spoke with pt at bedside regarding A1c and glucose control at home. Pt unable to get exenatide due to cost.Pt reports missing one dose of insulin and at home her glucose trends were anywhere from 200-400 range. Pt reports rationing out her insulins. Discussed back up options at [facility] and for her ton have a PCP appointment to form a back p plan with the insulins at [facility] over the counter so the pt does not do without insulin or rationing out doses. Discussed importance of glucose control on her recent diagnosis. Discussed different options for getting insulin and clinic follow up. Will attach  Vladimir Faster price list to AVS.  Thanks, Tama Headings RN, MSN, BC-ADM Inpatient Diabetes Coordinator Team Pager [phone] (8a-5p)

## 2020-08-08 NOTE — Progress Notes (Signed)
BRIEF PROGRESS NOTE  Subjective: Saw patient this morning during rounds. She endorses ongoing pleuritic chest pain. States she is breathing comfortably.   Objective:  Vitals stable. Patient remains afebrile. P 80s-90s. BP 87-96/60s-70s. SpO2 >94% on room air. Constitutional: tired- though well-appearing woman lying in ED stretcher, in no acute distress Cardiovascular: regular rate and rhythm, no m/r/g; no lower extremity edema Pulmonary/Chest: normal work of breathing on room air; rales over R lower lung field Abdominal: soft, non-tender, non-distended Neurological: alert & oriented to person, place, and time Skin: warm and dry Extremities: lower legs are equal in size without swelling or erythema  08/08/20 Bilateral lower extremity doppler: no evidence of DVT 08/08/20 Echocardiogram: LVEF 45-50% with LV global hypokinesis, mild asymmetric LV hypertrophy of the basal-septal segment. Right ventricular pressure overload. RV size is mild-to-moderately enlarged.   Assessment/Plan  Submassive pulmonary embolism Discussed case with IR this morning. Lower extremity doppler negative for DVT as above. Echocardiogram confirmed R heart strain. She remains on room air. Given her soft BP, will watch closely for decompensation with low threshold to contact IR/critical care. - continue IV heparin - discontinue home Megace - telemetry - low threshold to call IR/PCCM if patient becomes hemodynamically unstable  Community acquired pneumonia Rales auscultated over R lower lung field. Procalcitonin 0.30, suggestive of bacterial pneumonia.  - azithromycin/ceftriaxone day 1  Hypokalemia Hypomagnesemia Potassium of 2.9, mag of 1.6.  Patient received 40 mEq potassium chloride tab in ED and 1 g mag sulfate IV on admission. - 40 mEq KlorCon - AM BMP, Mg  History of hypertension - continue to hold antihypertensives in setting of soft BP and submassive PE  Type 2 diabetes Last A1c 10.8% in 05/2020.  Patient's home diabetes regimen is Lantus 20 units QHS, Novolog 10 units twice daily, metformin 1000 mg twice daily, and recently started exenatide 2 mg once weekly.  - Lantus 12 units QHS - SSI moderate  Alexandria Lodge, MD Internal Medicine Resident, PGY-1 [facility] Pager: [phone] 1:43 PM, 08/08/2020

## 2020-08-08 NOTE — H&P (Signed)
Date: 08/08/2020               Patient Name:  Dana Hughes MRN: [id number]  DOB: 1981-03-12 Age / Sex: 40 y.o., female   PCP: Sanjuan Dame, MD         Medical Service: Internal Medicine Teaching Service         Attending Physician: Dr. Sid Falcon, MD    First Contact: Alexandria Lodge, MD Pager: Blima Rich [phone]  Second Contact: Mitzi Hansen, MD Pager: [facility] [phone]       After Hours (After 5p/  First Contact Pager: [phone]  weekends / holidays): Second Contact Pager: [phone]   SUBJECTIVE   Chief Complaint: Chest pain, cough  History of Present Illness:  This was a 40 year old female with a past medical history of asthma, hypertension, endometriosis, type 2 diabetes, chronic cough who presents to the ED with acute worsening of her chest pain, shortness of breath, chronic cough for the past 3 to 4 days.  Patient endorses chronic cough since she received her second COVID vaccination in February.  Since then she has productive cough with white frothy phlegm.  She states over the past 2 days is acutely worsened and she was unable to perform her duties daily living due to worsening shortness of breath.  She also notes recurrent chest pain she describes as a pressure that is central and left-sided and attributes this to her chronic cough.  However yesterday the pain was so severe that she decided to come to the emergency department.  She denies any recent fevers or chills but does states she feels hot and cold.  Has felt lightheaded and dizzy after her chronic coughing episodes.  Denies any back pain.  Did feel nauseated and vomited yesterday evening 5 times.  Denies any blood in the vomit.  Denies any episodes of diarrhea.  Does endorse recent weight loss that PCP attributed to decreased p.o. appetite as well as diabetes.  Denied recent long plane rides or car rides.   Lab Orders     Culture, blood (routine x 2)     Resp Panel by RT-PCR (Flu A&B, Covid) Nasopharyngeal Swab     Basic  metabolic panel     CBC with Differential     D-dimer, quantitative     Protime-INR     APTT     Heparin level (unfractionated)     Heparin level (unfractionated)     CBC     Magnesium     Lactic acid, plasma     Brain natriuretic peptide     Comprehensive metabolic panel     Procalcitonin - Baseline     CBG monitoring, ED   Meds:  Current Facility-Administered Medications for the 08/08/20 encounter [facility] Encounter)  Medication  . 0.9 %  sodium chloride infusion  . medroxyPROGESTERone (DEPO-PROVERA) injection 150 mg   Current Meds  Medication Sig  . albuterol (PROVENTIL HFA;VENTOLIN HFA) 108 (90 Base) MCG/ACT inhaler Inhale 2 puffs into the lungs every 6 (six) hours as needed for wheezing or shortness of breath. Reported on 09/29/2015  . albuterol (PROVENTIL) (2.5 MG/3ML) 0.083% nebulizer solution Take 3 mLs (2.5 mg total) by nebulization every 6 (six) hours as needed for wheezing or shortness of breath.  . AMBULATORY NON FORMULARY MEDICATION Nitroglycerin 0.125% gel apply a small amount to the rectum 3 times daily x 6-8 weeks.  . AMBULATORY NON FORMULARY MEDICATION Medication Name: nitroglycerin 0.125 % gel applied to the rectum TID FOR 6 WEEKS  6-8 WEEKS ( NOT TO BE USED WITH CONCURRENT PHOSPHODIESTERASE INHIBITORS).  Marland Kitchen amLODipine (NORVASC) 5 MG tablet Take 1 tablet (5 mg total) by mouth daily.  . ARIPiprazole (ABILIFY) 5 MG tablet Take 1 tablet (5 mg total) by mouth daily.  Marland Kitchen artificial tears (LACRILUBE) OINT ophthalmic ointment Place into both eyes at bedtime as needed for dry eyes.  . benzonatate (TESSALON) 100 MG capsule Take 1 capsule (100 mg total) by mouth 3 (three) times daily as needed for cough.  . busPIRone (BUSPAR) 10 MG tablet Take 1 tablet (10 mg total) by mouth 3 (three) times daily.  Marland Kitchen dicyclomine (BENTYL) 10 MG capsule Take 1 capsule (10 mg total) by mouth 4 (four) times daily as needed for spasms (abdominal pain).  . Exenatide ER (BYDUREON BCISE) 2 MG/0.85ML AUIJ  Inject 2 mg into the skin once a week.  . fluticasone (FLONASE) 50 MCG/ACT nasal spray Place 1 spray into both nostrils in the morning and at bedtime. (Patient taking differently: Place 1 spray into both nostrils daily as needed.)  . glucose blood test strip Check your sugar in the morning before you eat breakfast, and one hour after a meal.  . glucose monitoring kit (FREESTYLE) monitoring kit 1 each by Does not apply route daily. Check glucose once in the morning before breakfast and 1 hour after a meal  . guaifenesin (ROBITUSSIN) 100 MG/5ML syrup Take 200 mg by mouth 3 (three) times daily as needed for cough.  . hydrOXYzine (ATARAX/VISTARIL) 10 MG tablet Take 1 tablet (10 mg total) by mouth 3 (three) times daily as needed. (Patient taking differently: Take 10 mg by mouth 3 (three) times daily as needed for anxiety.)  . insulin aspart (NOVOLOG) 100 UNIT/ML FlexPen Inject 10 Units into the skin 2 (two) times daily with a meal.  . insulin glargine (LANTUS SOLOSTAR) 100 UNIT/ML Solostar Pen Inject 20 Units into the skin daily at 10 pm. (Patient taking differently: Inject 20 Units into the skin at bedtime.)  . Insulin Pen Needle (UNIFINE PEN NEEDLES) 32G X 4 MM MISC 1 application by Does not apply route in the morning, at noon, and at bedtime.  . meclizine (ANTIVERT) 25 MG tablet Take 1 tablet (25 mg total) by mouth 3 (three) times daily as needed for dizziness.  . metFORMIN (GLUCOPHAGE) 1000 MG tablet Take 1 tablet (1,000 mg total) by mouth 2 (two) times daily with a meal.  . mirtazapine (REMERON) 30 MG tablet Take 1 tablet (30 mg total) by mouth at bedtime.  Marland Kitchen omeprazole (PRILOSEC) 20 MG capsule Take 1 capsule (20 mg total) by mouth daily before breakfast.  . Polysaccharide Iron Complex 150 MG TABS Take 150 mg by mouth daily.  . Pseudoeph-Doxylamine-DM-APAP (NYQUIL PO) Take 1 Dose by mouth at bedtime as needed (cold/cough).  . Pseudoephedrine-APAP-DM (DAYQUIL PO) Take 1 Dose by mouth daily as needed  (cold/cough).    Social:  Lives With: Boyfriend and his 2 sons Occupation: Taken time off since diabetes has been exacerbated.  Prior was working at [employer]: Good support system at home Level of Function: Able to perform all details prior to shortness of breath PCP: Hadassah Pais Substances: Denies tobacco use alcohol use substance use.  Denies take any prescription medications that are not prescribed to her  Family History: She endorses extensive family history of cancer, stroke, heart attacks.  She states that her aunt had a pulmonary embolism at 1 point.  Allergies: Allergies as of 08/08/2020 - Review Complete 08/08/2020  Allergen  Reaction Noted  . Mushroom extract complex Anaphylaxis 06/05/2014  . Latex Itching and Swelling 07/07/2017  . Penicillins Itching 01/18/2011   Past Medical History:  Diagnosis Date  . Anxiety   . Arthritis   . Asthma   . Bronchitis   . BV (bacterial vaginosis)   . Candidiasis   . Depression   . Diabetes mellitus without complication ([place])   . Endometriosis   . Headache(784.0)    migraines  . Hypertension    no longer takes meds  . Migraines   . Muscle spasm   . Ovarian cyst   . Pelvic pain in female 09/29/2015  . Scoliosis   . Uterine fibroid     Past Surgical History:  Procedure Laterality Date  . CHOLECYSTECTOMY    . TUBAL LIGATION     Review of Systems: A complete ROS was negative except as per HPI.   OBJECTIVE:   Physical Exam: Blood pressure 96/67, pulse 88, temperature 98.9 F (37.2 C), temperature source Oral, resp. rate 10, height 5' 3"  (1.6 m), weight 82 kg, SpO2 96 %. Constitutional: Mild distress, in bed HENT: normocephalic atraumatic, mucous membranes dry Eyes: conjunctiva non-erythematous Neck: supple Cardiovascular: Tachycardic, regular rhythm, no murmurs gallops or rubs. Pulmonary/Chest: normal work of breathing on room air, lungs clear to auscultation bilaterally Abdominal: soft, non-tender,  non-distended MSK: normal bulk and tone Neurological: alert & oriented x 3 Skin: warm and dry Psych: Normal mood and thought process  Labs: CBC    Component Value Date/Time   WBC 15.0 (H) 08/08/2020 0135   RBC 3.87 08/08/2020 0135   HGB 9.7 (L) 08/08/2020 0135   HGB 12.0 05/05/2020 1429   HCT 30.9 (L) 08/08/2020 0135   HCT 38.3 05/05/2020 1429   PLT 358 08/08/2020 0135   PLT 404 05/05/2020 1429   MCV 79.8 (L) 08/08/2020 0135   MCV 85 05/05/2020 1429   MCH 25.1 (L) 08/08/2020 0135   MCHC 31.4 08/08/2020 0135   RDW 14.5 08/08/2020 0135   RDW 13.7 05/05/2020 1429   LYMPHSABS 2.6 08/08/2020 0135   LYMPHSABS 3.9 (H) 04/05/2020 1614   MONOABS 0.7 08/08/2020 0135   EOSABS 0.0 08/08/2020 0135   EOSABS 0.1 04/05/2020 1614   BASOSABS 0.0 08/08/2020 0135   BASOSABS 0.1 04/05/2020 1614     CMP     Component Value Date/Time   NA 134 (L) 08/08/2020 0135   NA 137 04/05/2020 1614   K 2.9 (L) 08/08/2020 0135   CL 99 08/08/2020 0135   CO2 24 08/08/2020 0135   GLUCOSE 308 (H) 08/08/2020 0135   BUN <5 (L) 08/08/2020 0135   BUN 4 (L) 04/05/2020 1614   CREATININE 0.69 08/08/2020 0135   CALCIUM 8.8 (L) 08/08/2020 0135   PROT 7.7 11/13/2019 1153   ALBUMIN 4.5 11/13/2019 1153   AST 18 11/13/2019 1153   ALT 25 11/13/2019 1153   ALKPHOS 51 11/13/2019 1153   BILITOT 0.4 11/13/2019 1153   GFRNONAA >60 08/08/2020 0135   GFRAA 103 04/05/2020 1614    Imaging: DG Chest 2 View  Result Date: 08/08/2020 CLINICAL DATA:  Cough EXAM: CHEST - 2 VIEW COMPARISON:  07/03/2020 FINDINGS: The heart size and mediastinal contours are within normal limits. Both lungs are clear. The visualized skeletal structures are unremarkable. IMPRESSION: Normal study. Electronically Signed   By: Rolm Baptise M.D.   On: 08/08/2020 01:59   CT Angio Chest PE W/Cm &/Or Wo Cm  Result Date: 08/08/2020 CLINICAL DATA:  Months of cough,  now with developing chest pain, positive D-dimer EXAM: CT ANGIOGRAPHY CHEST WITH  CONTRAST TECHNIQUE: Multidetector CT imaging of the chest was performed using the standard protocol during bolus administration of intravenous contrast. Multiplanar CT image reconstructions and MIPs were obtained to evaluate the vascular anatomy. CONTRAST:  42m OMNIPAQUE IOHEXOL 350 MG/ML SOLN COMPARISON:  Radiograph 08/08/2020 FINDINGS: Cardiovascular: Satisfactory opacification of pulmonary arteries. Bilateral hypodense pulmonary artery emboli are seen extending throughout the left upper and lower lobar arteries and throughout the segmental and subsegmental branches of the upper, lower lobe and lingula. Filling defect in the right lung extends from the distal right main and interlobar pulmonary arteries throughout the lobar, segmental and subsegmental branches of the right upper, middle and lower lobe. There is central pulmonary artery enlargement as well as flattening of the intraventricular septum and elevated RV/LV ratio of approximately 1.2. Cardiac size remains within top normal limits. No pericardial effusion. The aortic root is suboptimally assessed given cardiac pulsation artifact. The aorta is normal caliber. No acute luminal abnormality of the imaged aorta. No periaortic stranding or hemorrhage. Normal 3 vessel branching of the aortic arch. Proximal great vessels are unremarkable. No major venous abnormalities. Mediastinum/Nodes: No mediastinal fluid or gas. Normal thyroid gland and thoracic inlet. No acute abnormality of the trachea or esophagus. No worrisome mediastinal, hilar or axillary adenopathy. Lungs/Pleura: Patchy multifocal areas of ground-glass opacity are present predominantly throughout the right lobe though minimally in the left upper lobe as well. Largely regions demonstrate some subpleural sparing though more peripheral opacity is seen elsewhere particular in the upper and lower lobes. Minimal septal thickening is present as well. No pneumothorax. No layering effusion. No concerning  pulmonary nodules or masses. Upper Abdomen: No acute abnormalities present in the visualized portions of the upper abdomen. Musculoskeletal: Multilevel degenerative changes are present in the imaged portions of the spine. No acute osseous abnormality or suspicious osseous lesion. No worrisome chest wall mass or lesion. Review of the MIP images confirms the above findings. IMPRESSION: 1. Extensive bilateral pulmonary artery emboli involving the distal right main and interlobar pulmonary arteries as well as throughout the lobar, segmental and subsegmental branches of both the right and left lung. Positive for acute PE with CT evidence of right heart strain (RV/LV Ratio 1.2) consistent with at least submassive (intermediate risk) PE. The presence of right heart strain has been associated with an increased risk of morbidity and mortality. 2. Patchy multifocal areas of ground-glass opacity predominantly throughout the right lobe though minimally in the left upper lobe as well. Findings are favored to reflect an infectious or inflammatory process with developing pulmonary infarct less favored given sub pleural regions of spared parenchyma though not fully excluded particularly in the more peripheral areas of opacity. Critical Value/emergent results were called by telephone at the time of interpretation on 08/08/2020 at 5:24 am [facility] verbally acknowledged these results. Electronically Signed   By: PLovena LeM.D.   On: 08/08/2020 05:25    EKG: personally reviewed my interpretation is sinus tachycardia with a rate of 123 bpm   ASSESSMENT & PLAN:    Assessment & Plan by Problem: Active Problems:   Pulmonary embolus (HCC)   Hypokalemia   Hypomagnesemia   Bilateral pulmonary embolism (HCC)   Dana Hughes a 40y.o. with pertinent PMH of  asthma, hypertension, type 2 diabetes, chronic cough who presented with chronic cough and worsening shortness of breath and admit for  pulmonary embolism on hospital day  0  Submassive pulmonary embolism Patient with worsening shortness of breath as well as pleuritic chest pain for the past 24 to 48 hours.  Upon admission she was found to be hypotensive and tachycardic.  D-dimer was elevated above 2.0.  CT angio chest showed evidence of extensive bilateral pulmonary artery emboli involving distal right main and interlobar pulmonary arteries as well as throughout lobar segmental and subsegmental branches of both right and left lungs. There was evidence of right heart strain but her hypotension resolved and her blood sugars remained stable. sPESI score puts patient in the high risk category.  Unclear if provoked or unprovoked etiology.  Patient will need further work-up to fully evaluate for this.  She does have history of weight loss but this was thought to be secondary to decreased appetite per her PCP.  Patient also on megestrol per med list, this may increase her risk for blood clots.  Denies any long car rides, does note having some pain in bilateral calves but denies swelling or erythema.  Did receive second dose of COVID-vaccine in February, unsure if this played a role in this process.  Day team discussed case with IR who recommended no invasive interventions at this time.  Continue to monitor and perform lower extremity ultrasound to assess for DVTs.  Do note that if patient does Decompensate or has acute worsening of her symptoms, to reconsult them. -IV heparin, heparin levels per pharmacy -Cardiac monitoring                                                                                                                                                  -Call PCCM/IR if any acute changes  Multifocal opacities Leukocytosis Patchy multifocal areas of groundglass opacity predominantly throughout the right lobe segmental and left as well.  Difficult to assess if this is infectious or inflammatory etiologies also possible infarction.  We  will continue IV antibiotics to cover for infectious etiology given worsening of chronic cough as well as leukocytosis, consider stopping if Pro-Cal negative. -Azithromycin/ceftriaxone day 1 -Procalcitonin pending  Hypokalemia Hypomagnesemia Potassium of 2.9, mag of 1.6.  Patient received 40 mill equivalents potassium chloride tab in ED.  -1 g mag sulfate IV -Repeat labs this afternoon, supplement as needed.  History of hypertension We will hold antihypertensives in setting of submassive PE  History of type 2 diabetes Start SSI  Diet: Heart Healthy VTE: Heparin IVF: None,None Code: Full  Prior to Admission Living Arrangement: Home, living boyfriend Anticipated Discharge Location: Home Barriers to Discharge: Management of pulmonary embolism  Dispo: Admit patient to Inpatient with expected length of stay greater than 2 midnights.  Signed: Riesa Pope, MD Internal Medicine Resident PGY-1 Pager: [phone]  08/08/2020, 8:34 AM

## 2020-08-08 NOTE — Progress Notes (Incomplete)
HD#0 Subjective:  Overnight Events: ***   Patient states that her chest pain is persistent, in the central chest.   Reports coughing  Shower? Left flank pain like passing kidney stone?  Dont smoke cigarette but reports secondhand smoking. Recently stopped marijuana, last smoke was last week.    Objective:  Vital signs in last 24 hours: Vitals:   08/08/20 0645 08/08/20 0715 08/08/20 0815 08/08/20 0845  BP: 90/63 92/67 96/67  (!) 87/68  Pulse: 96 96 88 86  Resp: (!) 27 20 10 17   Temp:      TempSrc:      SpO2: 98% 94% 96% 96%  Weight:      Height:       Supplemental O2: {NAMES:3044014::"Room Air","Nasal Cannula","Simple Face Mask","Partial Rebreather","HFNC","Non Rebreather","Venturi Mask","Bag Valve Mask"} SpO2: 96 %   Physical Exam:  Physical Exam  Filed Weights   08/08/20 0020  Weight: 82 kg     Intake/Output Summary (Last 24 hours) at 08/08/2020 0943 Last data filed at 08/08/2020 0729 Gross per 24 hour  Intake 1350 ml  Output -  Net 1350 ml   Net IO Since Admission: 1,350 mL [08/08/20 0943]  Pertinent Labs: CBC Latest Ref Rng & Units 08/08/2020 06/14/2020 05/05/2020  WBC 4.0 - 10.5 K/uL 15.0(H) 10.8(H) 5.8  Hemoglobin 12.0 - 15.0 g/dL 9.7(L) 11.9(L) 12.0  Hematocrit 36.0 - 46.0 % 30.9(L) 36.4 38.3  Platelets 150 - 400 K/uL 358 356.0 404    CMP Latest Ref Rng & Units 08/08/2020 04/05/2020 02/22/2020  Glucose 70 - 99 mg/dL 308(H) 148(H) 362(H)  BUN 6 - 20 mg/dL <5(L) 4(L) 6  Creatinine 0.44 - 1.00 mg/dL 0.69 0.83 0.72  Sodium 135 - 145 mmol/L 134(L) 137 137  Potassium 3.5 - 5.1 mmol/L 2.9(L) 3.5 3.7  Chloride 98 - 111 mmol/L 99 102 104  CO2 22 - 32 mmol/L 24 20 22   Calcium 8.9 - 10.3 mg/dL 8.8(L) 9.7 9.4  Total Protein 6.5 - 8.1 g/dL - - -  Total Bilirubin 0.3 - 1.2 mg/dL - - -  Alkaline Phos 38 - 126 U/L - - -  AST 15 - 41 U/L - - -  ALT 0 - 44 U/L - - -    Imaging: DG Chest 2 View  Result Date: 08/08/2020 CLINICAL DATA:  Cough EXAM: CHEST - 2  VIEW COMPARISON:  07/03/2020 FINDINGS: The heart size and mediastinal contours are within normal limits. Both lungs are clear. The visualized skeletal structures are unremarkable. IMPRESSION: Normal study. Electronically Signed   By: Rolm Baptise M.D.   On: 08/08/2020 01:59   CT Angio Chest PE W/Cm &/Or Wo Cm  Result Date: 08/08/2020 CLINICAL DATA:  Months of cough, now with developing chest pain, positive D-dimer EXAM: CT ANGIOGRAPHY CHEST WITH CONTRAST TECHNIQUE: Multidetector CT imaging of the chest was performed using the standard protocol during bolus administration of intravenous contrast. Multiplanar CT image reconstructions and MIPs were obtained to evaluate the vascular anatomy. CONTRAST:  87mL OMNIPAQUE IOHEXOL 350 MG/ML SOLN COMPARISON:  Radiograph 08/08/2020 FINDINGS: Cardiovascular: Satisfactory opacification of pulmonary arteries. Bilateral hypodense pulmonary artery emboli are seen extending throughout the left upper and lower lobar arteries and throughout the segmental and subsegmental branches of the upper, lower lobe and lingula. Filling defect in the right lung extends from the distal right main and interlobar pulmonary arteries throughout the lobar, segmental and subsegmental branches of the right upper, middle and lower lobe. There is central pulmonary artery enlargement as well as flattening of  the intraventricular septum and elevated RV/LV ratio of approximately 1.2. Cardiac size remains within top normal limits. No pericardial effusion. The aortic root is suboptimally assessed given cardiac pulsation artifact. The aorta is normal caliber. No acute luminal abnormality of the imaged aorta. No periaortic stranding or hemorrhage. Normal 3 vessel branching of the aortic arch. Proximal great vessels are unremarkable. No major venous abnormalities. Mediastinum/Nodes: No mediastinal fluid or gas. Normal thyroid gland and thoracic inlet. No acute abnormality of the trachea or esophagus. No  worrisome mediastinal, hilar or axillary adenopathy. Lungs/Pleura: Patchy multifocal areas of ground-glass opacity are present predominantly throughout the right lobe though minimally in the left upper lobe as well. Largely regions demonstrate some subpleural sparing though more peripheral opacity is seen elsewhere particular in the upper and lower lobes. Minimal septal thickening is present as well. No pneumothorax. No layering effusion. No concerning pulmonary nodules or masses. Upper Abdomen: No acute abnormalities present in the visualized portions of the upper abdomen. Musculoskeletal: Multilevel degenerative changes are present in the imaged portions of the spine. No acute osseous abnormality or suspicious osseous lesion. No worrisome chest wall mass or lesion. Review of the MIP images confirms the above findings. IMPRESSION: 1. Extensive bilateral pulmonary artery emboli involving the distal right main and interlobar pulmonary arteries as well as throughout the lobar, segmental and subsegmental branches of both the right and left lung. Positive for acute PE with CT evidence of right heart strain (RV/LV Ratio 1.2) consistent with at least submassive (intermediate risk) PE. The presence of right heart strain has been associated with an increased risk of morbidity and mortality. 2. Patchy multifocal areas of ground-glass opacity predominantly throughout the right lobe though minimally in the left upper lobe as well. Findings are favored to reflect an infectious or inflammatory process with developing pulmonary infarct less favored given sub pleural regions of spared parenchyma though not fully excluded particularly in the more peripheral areas of opacity. Critical Value/emergent results were called by telephone at the time of interpretation on 08/08/2020 at 5:24 am to provider [facility] , who verbally acknowledged these results. Electronically Signed   By: Lovena Le M.D.   On: 08/08/2020 05:25     Assessment/Plan:   Active Problems:   Pulmonary embolus (HCC)   Hypokalemia   Hypomagnesemia   Bilateral pulmonary embolism ([place])   Patient Summary: Dana Hughes is a 40 y.o. with a pertinent PMH of ***, who presented with *** and admitted for ***.    *** ***  *** ***  *** ***  *** ***  Diet: {NAMES:3044014::"Normal","Heart Healthy","Carb-Modified","Renal","Carb/Renal","NPO","TPN","Tube Feeds"} IVF: {NAMES:3044014::"None","NS","1/2 NS","LR","D5","D10"},{NAMES:3044014::"None","10cc/hr","25cc/hr","50cc/hr","75cc/hr","100cc/hr","110cc/hr","125cc/hr","Bolus"} VTE: {NAMES:3044014::"Heparin","Enoxaparin","SCDs","NOAC","None"} Code: {NAMES:3044014::"Full","DNR","DNI","DNR/DNI","Comfort Care","Unknown"} PT/OT recs: {NAMES:3044014::"None","Pending","CIR","SNF for Subacute PT","LTAC","Home Health"}, {Assistive Devices SPQ:33007}. TOC recs: ***   Dispo: Anticipated discharge to {Discharge Destination:18313::"Home"} in {NUMBERS:20191} days pending ***.   Gaylan Gerold, DO 08/08/2020, 9:43 AM Pager: [phone]  Please contact the on call pager after 5 pm and on weekends at [phone].

## 2020-08-09 ENCOUNTER — Encounter (HOSPITAL_COMMUNITY): Payer: Self-pay | Admitting: Internal Medicine

## 2020-08-09 DIAGNOSIS — D509 Iron deficiency anemia, unspecified: Secondary | ICD-10-CM

## 2020-08-09 DIAGNOSIS — E119 Type 2 diabetes mellitus without complications: Secondary | ICD-10-CM

## 2020-08-09 DIAGNOSIS — J189 Pneumonia, unspecified organism: Secondary | ICD-10-CM | POA: Diagnosis present

## 2020-08-09 DIAGNOSIS — R634 Abnormal weight loss: Secondary | ICD-10-CM | POA: Diagnosis present

## 2020-08-09 DIAGNOSIS — R1013 Epigastric pain: Secondary | ICD-10-CM

## 2020-08-09 LAB — CBC WITH DIFFERENTIAL/PLATELET
Abs Immature Granulocytes: 0.06 10*3/uL (ref 0.00–0.07)
Basophils Absolute: 0 10*3/uL (ref 0.0–0.1)
Basophils Relative: 0 %
Eosinophils Absolute: 0.2 10*3/uL (ref 0.0–0.5)
Eosinophils Relative: 2 %
HCT: 32.4 % — ABNORMAL LOW (ref 36.0–46.0)
Hemoglobin: 9.7 g/dL — ABNORMAL LOW (ref 12.0–15.0)
Immature Granulocytes: 1 %
Lymphocytes Relative: 31 %
Lymphs Abs: 3 10*3/uL (ref 0.7–4.0)
MCH: 24.6 pg — ABNORMAL LOW (ref 26.0–34.0)
MCHC: 29.9 g/dL — ABNORMAL LOW (ref 30.0–36.0)
MCV: 82 fL (ref 80.0–100.0)
Monocytes Absolute: 0.5 10*3/uL (ref 0.1–1.0)
Monocytes Relative: 5 %
Neutro Abs: 6.2 10*3/uL (ref 1.7–7.7)
Neutrophils Relative %: 61 %
Platelets: 398 10*3/uL (ref 150–400)
RBC: 3.95 MIL/uL (ref 3.87–5.11)
RDW: 14.8 % (ref 11.5–15.5)
WBC: 10 10*3/uL (ref 4.0–10.5)
nRBC: 0 % (ref 0.0–0.2)

## 2020-08-09 LAB — HEPARIN LEVEL (UNFRACTIONATED): Heparin Unfractionated: 0.35 IU/mL (ref 0.30–0.70)

## 2020-08-09 LAB — COMPREHENSIVE METABOLIC PANEL
ALT: 44 U/L (ref 0–44)
AST: 44 U/L — ABNORMAL HIGH (ref 15–41)
Albumin: 2.7 g/dL — ABNORMAL LOW (ref 3.5–5.0)
Alkaline Phosphatase: 68 U/L (ref 38–126)
Anion gap: 6 (ref 5–15)
BUN: <5 mg/dL — ABNORMAL LOW (ref 6–20)
CO2: 22 mmol/L (ref 22–32)
Calcium: 8.4 mg/dL — ABNORMAL LOW (ref 8.9–10.3)
Chloride: 104 mmol/L (ref 98–111)
Creatinine, Ser: 0.68 mg/dL (ref 0.44–1.00)
GFR, Estimated: >60 mL/min (ref >60)
Glucose, Bld: 284 mg/dL — ABNORMAL HIGH (ref 70–99)
Potassium: 3.5 mmol/L (ref 3.5–5.1)
Sodium: 132 mmol/L — ABNORMAL LOW (ref 135–145)
Total Bilirubin: 0.4 mg/dL (ref 0.3–1.2)
Total Protein: 5.8 g/dL — ABNORMAL LOW (ref 6.5–8.1)

## 2020-08-09 LAB — CBC
HCT: 27.5 % — ABNORMAL LOW (ref 36.0–46.0)
Hemoglobin: 8.3 g/dL — ABNORMAL LOW (ref 12.0–15.0)
MCH: 24.9 pg — ABNORMAL LOW (ref 26.0–34.0)
MCHC: 30.2 g/dL (ref 30.0–36.0)
MCV: 82.6 fL (ref 80.0–100.0)
Platelets: 381 10*3/uL (ref 150–400)
RBC: 3.33 MIL/uL — ABNORMAL LOW (ref 3.87–5.11)
RDW: 14.9 % (ref 11.5–15.5)
WBC: 11.3 10*3/uL — ABNORMAL HIGH (ref 4.0–10.5)
nRBC: 0 % (ref 0.0–0.2)

## 2020-08-09 LAB — GLUCOSE, CAPILLARY
Glucose-Capillary: 254 mg/dL — ABNORMAL HIGH (ref 70–99)
Glucose-Capillary: 290 mg/dL — ABNORMAL HIGH (ref 70–99)
Glucose-Capillary: 315 mg/dL — ABNORMAL HIGH (ref 70–99)
Glucose-Capillary: 236 mg/dL — ABNORMAL HIGH (ref 70–99)

## 2020-08-09 MED ORDER — ACETAMINOPHEN 650 MG RE SUPP: 650.0000 mg | Freq: Four times a day (QID) | RECTAL | Status: DC | PRN

## 2020-08-09 MED ORDER — OXYCODONE HCL 5 MG PO TABS
5.0000 mg | ORAL_TABLET | Freq: Four times a day (QID) | ORAL | Status: DC | PRN
  Administered 2020-08-09 – 2020-08-12 (×6): 5 mg via ORAL
  Filled 2020-08-09 (×6): qty 1

## 2020-08-09 MED ORDER — ACETAMINOPHEN 500 MG PO TABS
1000.0000 mg | ORAL_TABLET | Freq: Four times a day (QID) | ORAL | Status: DC | PRN
  Administered 2020-08-10 – 2020-08-12 (×6): 1000 mg via ORAL
  Filled 2020-08-09 (×6): qty 2

## 2020-08-09 MED ORDER — GUAIFENESIN 100 MG/5ML PO SOLN
5.0000 mL | Freq: Four times a day (QID) | ORAL | Status: DC | PRN
  Administered 2020-08-09 – 2020-08-12 (×7): 100 mg via ORAL
  Filled 2020-08-09 (×7): qty 5

## 2020-08-09 MED ORDER — INSULIN GLARGINE 100 UNIT/ML ~~LOC~~ SOLN
18.0000 [IU] | Freq: Every day | SUBCUTANEOUS | Status: DC
  Administered 2020-08-09: 18 [IU] via SUBCUTANEOUS
  Filled 2020-08-09 (×2): qty 0.18

## 2020-08-09 NOTE — Plan of Care (Signed)

## 2020-08-09 NOTE — Progress Notes (Incomplete)
Initial Nutrition Assessment  DOCUMENTATION CODES:      INTERVENTION:  ***   NUTRITION DIAGNOSIS:     related to   as evidenced by  .    GOAL:        MONITOR:      REASON FOR ASSESSMENT:   Malnutrition Screening Tool    ASSESSMENT:         NUTRITION - FOCUSED PHYSICAL EXAM:  {RD Focused Exam List:21252}  Diet Order:   Diet Order            Diet Heart Room service appropriate? Yes; Fluid consistency: Thin  Diet effective now                 EDUCATION NEEDS:      Skin:     Last BM:     Height:   Ht Readings from Last 1 Encounters:  08/08/20 5\' 3"  (1.6 m)    Weight:   Wt Readings from Last 1 Encounters:  08/08/20 82 kg    Ideal Body Weight:     BMI:  Body mass index is 32.02 kg/m.  Estimated Nutritional Needs:   Kcal:     Protein:     Fluid:       ***

## 2020-08-09 NOTE — Progress Notes (Addendum)
[place] for Heparin Indication: pulmonary embolus  Labs: Recent Labs    08/08/20 0135 08/08/20 0518 08/08/20 0600 08/08/20 0757 08/08/20 1405 08/08/20 2216 08/09/20 0322  HGB 9.7*  --   --   --   --   --  8.3*  HCT 30.9*  --   --   --   --   --  27.5*  PLT 358  --   --   --   --   --  381  APTT  --  24  --   --   --   --   --   LABPROT  --  15.1  --   --   --   --   --   INR  --  1.2  --   --   --   --   --   HEPARINUNFRC  --   --   --   --  0.22* 0.48 0.35  CREATININE 0.69  --   --   --   --   --  0.68  TROPONINIHS  --   --  16 11  --   --   --     Assessment: 40 y.o. W found to have acute submassive PE on CT. No anticoagulation prior to admission. Pharmacy consulted for heparin.    Heparin level down to 0.35 units/ml, still therapeutic. H/H slight drop likely from lab draws, plt stable. No signs of bleeding per patient. Will target higher end of goal given clot burden.   Goal of Therapy:  Heparin level 0.3-0.7 units/ml Monitor platelets by anticoagulation protocol: Yes   Plan:  Increase heparin to 1550 units / hr Monitor daily HL, CBC/plt Monitor for signs/symptoms of bleeding  F/u long term plan   Benetta Spar, PharmD, BCPS, BCCP Clinical Pharmacist  Please check AMION for all [place] phone numbers After 10:00 PM, call [place] [phone]

## 2020-08-09 NOTE — Progress Notes (Addendum)
Inpatient Diabetes Program Recommendations  AACE/ADA: New Consensus Statement on Inpatient Glycemic Control (2015)  Target Ranges:  Prepandial:   less than 140 mg/dL      Peak postprandial:   less than 180 mg/dL (1-2 hours)      Critically ill patients:  140 - 180 mg/dL   Lab Results  Component Value Date   GLUCAP 254 (H) 08/09/2020   HGBA1C 10.8 (A) 05/25/2020    Review of Glycemic Control Results for Dana Hughes, Dana Hughes (MRN [id number]) as of 08/09/2020 09:43  Ref. Range 08/08/2020 07:57 08/08/2020 12:29 08/08/2020 17:03 08/08/2020 23:07 08/09/2020 07:34  Glucose-Capillary Latest Ref Range: 70 - 99 mg/dL 244 (H) 274 (H) 289 (H) 258 (H) 254 (H)   Diabetes history: DM 2 Outpatient Diabetes medications: Lantus 20 units, Novolog 10 units bid, Metformin 1000 mg bid, recently prescribed Byetta 2 mg weekly last PCP visit (unsure if pt got medication) Current orders for Inpatient glycemic control: Lantus 18 units qhs, Novolog 0-9 units tid + hs  Inpatient Diabetes Program Recommendations:   -Add Novolog 4 units tid meal coverage if eats 50% -Change Lantus to be given daily am  Thank you, Bethena Roys E. Kaiden Pech, RN, MSN, CDE  Diabetes Coordinator Inpatient Glycemic Control Team Team Pager [phone] (8am-5pm) 08/09/2020 9:46 AM

## 2020-08-09 NOTE — Progress Notes (Signed)
HD#1 Subjective:   No acute events overnight.  During evaluation this morning, patient states she is feeling "ok." Reports she is sleepy because she did not get much sleep last night due to persistent coughing. States her chest pain is improved at rest however continues to hurt with coughing. Reports she has been up and out of bed and ambulating around the room. Denies dark or bloody stools, hematuria, abdominal pain. States she was supposed to have a colonoscopy this week however had to cancel due to being in the hospital.   Objective:   Vital signs in last 24 hours: Vitals:   08/08/20 1600 08/08/20 2039 08/09/20 0033 08/09/20 0343  BP: 115/75 105/81 103/71 103/69  Pulse: 93 (!) 102 (!) 105 100  Resp: 20 20 20 20   Temp:  99 F (37.2 C) 98.9 F (37.2 C) 98 F (36.7 C)  TempSrc:  Oral Oral Oral  SpO2:  100% 96% 95%  Weight:      Height:       Physical Exam Constitutional:tired- though well-appearing woman lying in bed, in no acute distress Cardiovascular:regular rate and rhythm, no m/r/g; no lower extremity edema Pulmonary/Chest: normal work of breathing on room air, periodic dry cough Abdominal: soft, non-tender, non-distended Skin: warm and dry  Pertinent Labs: CBC Latest Ref Rng & Units 08/09/2020 08/08/2020 06/14/2020  WBC 4.0 - 10.5 K/uL 11.3(H) 15.0(H) 10.8(H)  Hemoglobin 12.0 - 15.0 g/dL 8.3(L) 9.7(L) 11.9(L)  Hematocrit 36.0 - 46.0 % 27.5(L) 30.9(L) 36.4  Platelets 150 - 400 K/uL 381 358 356.0    CMP Latest Ref Rng & Units 08/09/2020 08/08/2020 04/05/2020  Glucose 70 - 99 mg/dL 284(H) 308(H) 148(H)  BUN 6 - 20 mg/dL <5(L) <5(L) 4(L)  Creatinine 0.44 - 1.00 mg/dL 0.68 0.69 0.83  Sodium 135 - 145 mmol/L 132(L) 134(L) 137  Potassium 3.5 - 5.1 mmol/L 3.5 2.9(L) 3.5  Chloride 98 - 111 mmol/L 104 99 102  CO2 22 - 32 mmol/L 22 24 20   Calcium 8.9 - 10.3 mg/dL 8.4(L) 8.8(L) 9.7  Total Protein 6.5 - 8.1 g/dL 5.8(L) - -  Total Bilirubin 0.3 - 1.2 mg/dL 0.4 - -   Alkaline Phos 38 - 126 U/L 68 - -  AST 15 - 41 U/L 44(H) - -  ALT 0 - 44 U/L 44 - -    Assessment/Plan:   Active Problems:   Pulmonary embolus (HCC)   Hypokalemia   Hypomagnesemia   Bilateral pulmonary embolism (HCC)   Patient Summary:  Dana Hughes is a 40 year old woman with pertinent PMH of asthma, hypertension, type 2 diabetes, chronic cough who presented with chronic cough and worsening shortness of breath and admitted for pulmonary embolism and community acquired pneumonia.  This is hospital day 1.   Submassive pulmonary embolism She remains on room air. BP improving to 100s/60s-80s from 90s/60s-70s. Endorses persistent pleuritic chest pain and pain with coughing. As below, question whether possible colon cancer predisposed patient to clot. - continue IV heparin, will hold transition to PO until possible GI intervention inpatient vs outpatient - home Megace discontinued home Megace - telemetry - pain regimen: increase Tylenol to 1000 mg q6h PRN, add oxycodone 5 mg q6h for breakthrough pain - low threshold to call IR/PCCM if patient becomes hemodynamically unstable  Community acquired pneumonia Patient reports ongoing cough and associated pain. Tessalon perles not effective overnight. - azithromycin/ceftriaxone day 2 - start guaifenesin 100 mg q6h PRN - pain regimen as above - incentive spirometry  History of  iron deficiency anemia History of melena, epigastric abdominal pain Hemoglobin 11.9 one month ago, 9.7 on admission, 8.3 this morning. Patient denies melena or hematochezia. BUN <5, suggestive of lower GI bleed. Ferritin in 03/2020 was 13, and she was started on oral iron supplementation. As of clinic follow-up in 05/2020, she was unable to tolerate ferrous sulfate so was switched to polysaccharide iron complex. She also reported melena, decreased appetite, unintentional weight loss, night sweats, and given family history of colon cancer was referred to GI and  seen on 06/14/20. Unfortunately 06/22/20 colonoscopy was incomplete due to inadequate bowel prep, though posterior anal fissure was identified. She was supposed to have outpatient colonoscopy this week. If hemoglobin continues to drop, will consult GI for possible inpatient colonoscopy. Will continue IV heparin for ease of transition off. Transition to oral anticoagulation would certainly complicate outpatient colonoscopy. - f/u evening CBC - GI consult for possible inpatient colonoscopy if hemoglobin continues to downtrend  History of hypertension - continue to hold antihypertensives in setting of soft BP and submassive PE  Type 2 diabetes Last A1c 10.8% in 05/2020. Patient's home diabetes regimen is Lantus 20 units QHS, Novolog 10 units twice daily, metformin 1000 mg twice daily, and recently started exenatide 2 mg once weekly. Fasting CBG 254 this morning. Will increase Lantus. - Lantus 12->18 units QHS - SSI moderate  Hypokalemia, Hypomagnesemia Resolved - Monitor electrolytes  Diet: Heart Healthy IVF: None VTE: Heparin Code: Full    Please contact the on call pager after 5 pm and on weekends at [phone].  Alexandria Lodge, MD PGY-1 Internal Medicine Teaching Service Pager: [phone] 08/09/2020

## 2020-08-10 ENCOUNTER — Encounter: Payer: Medicaid Other | Admitting: Internal Medicine

## 2020-08-10 DIAGNOSIS — B379 Candidiasis, unspecified: Secondary | ICD-10-CM

## 2020-08-10 DIAGNOSIS — D649 Anemia, unspecified: Secondary | ICD-10-CM

## 2020-08-10 DIAGNOSIS — K921 Melena: Secondary | ICD-10-CM

## 2020-08-10 LAB — COMPREHENSIVE METABOLIC PANEL
ALT: 33 U/L (ref 0–44)
AST: 21 U/L (ref 15–41)
Albumin: 2.4 g/dL — ABNORMAL LOW (ref 3.5–5.0)
Alkaline Phosphatase: 52 U/L (ref 38–126)
Anion gap: 8 (ref 5–15)
BUN: <5 mg/dL — ABNORMAL LOW (ref 6–20)
CO2: 21 mmol/L — ABNORMAL LOW (ref 22–32)
Calcium: 8.5 mg/dL — ABNORMAL LOW (ref 8.9–10.3)
Chloride: 107 mmol/L (ref 98–111)
Creatinine, Ser: 0.65 mg/dL (ref 0.44–1.00)
GFR, Estimated: >60 mL/min (ref >60)
Glucose, Bld: 189 mg/dL — ABNORMAL HIGH (ref 70–99)
Potassium: 3.2 mmol/L — ABNORMAL LOW (ref 3.5–5.1)
Sodium: 136 mmol/L (ref 135–145)
Total Bilirubin: 0.2 mg/dL — ABNORMAL LOW (ref 0.3–1.2)
Total Protein: 5.4 g/dL — ABNORMAL LOW (ref 6.5–8.1)

## 2020-08-10 LAB — CBC
HCT: 27.5 % — ABNORMAL LOW (ref 36.0–46.0)
Hemoglobin: 8.2 g/dL — ABNORMAL LOW (ref 12.0–15.0)
MCH: 24.6 pg — ABNORMAL LOW (ref 26.0–34.0)
MCHC: 29.8 g/dL — ABNORMAL LOW (ref 30.0–36.0)
MCV: 82.6 fL (ref 80.0–100.0)
Platelets: 350 10*3/uL (ref 150–400)
RBC: 3.33 MIL/uL — ABNORMAL LOW (ref 3.87–5.11)
RDW: 15 % (ref 11.5–15.5)
WBC: 11.1 10*3/uL — ABNORMAL HIGH (ref 4.0–10.5)
nRBC: 0 % (ref 0.0–0.2)

## 2020-08-10 LAB — GLUCOSE, CAPILLARY
Glucose-Capillary: 179 mg/dL — ABNORMAL HIGH (ref 70–99)
Glucose-Capillary: 239 mg/dL — ABNORMAL HIGH (ref 70–99)
Glucose-Capillary: 204 mg/dL — ABNORMAL HIGH (ref 70–99)
Glucose-Capillary: 168 mg/dL — ABNORMAL HIGH (ref 70–99)

## 2020-08-10 LAB — HEPARIN LEVEL (UNFRACTIONATED): Heparin Unfractionated: 0.57 IU/mL (ref 0.30–0.70)

## 2020-08-10 LAB — MAGNESIUM: Magnesium: 1.9 mg/dL (ref 1.7–2.4)

## 2020-08-10 MED ORDER — ADULT MULTIVITAMIN W/MINERALS CH
1.0000 | ORAL_TABLET | Freq: Every day | ORAL | Status: DC
  Administered 2020-08-10 – 2020-08-12 (×3): 1 via ORAL
  Filled 2020-08-10 (×3): qty 1

## 2020-08-10 MED ORDER — PEG-KCL-NACL-NASULF-NA ASC-C 100 G PO SOLR
0.5000 | Freq: Once | ORAL | Status: AC
  Administered 2020-08-11: 100 g via ORAL
  Filled 2020-08-10: qty 1

## 2020-08-10 MED ORDER — INSULIN ASPART 100 UNIT/ML ~~LOC~~ SOLN
4.0000 [IU] | Freq: Three times a day (TID) | SUBCUTANEOUS | Status: DC
  Administered 2020-08-10 (×2): 4 [IU] via SUBCUTANEOUS

## 2020-08-10 MED ORDER — SODIUM CHLORIDE 0.9 % IV SOLN
1.0000 g | INTRAVENOUS | Status: AC
  Administered 2020-08-11 – 2020-08-12 (×2): 1 g via INTRAVENOUS
  Filled 2020-08-10 (×2): qty 1

## 2020-08-10 MED ORDER — BOOST / RESOURCE BREEZE PO LIQD CUSTOM
1.0000 | Freq: Three times a day (TID) | ORAL | Status: DC
  Administered 2020-08-10 – 2020-08-12 (×2): 1 via ORAL
  Filled 2020-08-10: qty 1

## 2020-08-10 MED ORDER — POLYETHYLENE GLYCOL 3350 17 G PO PACK: 17.0000 g | PACK | Freq: Two times a day (BID) | ORAL | Status: DC

## 2020-08-10 MED ORDER — BENZONATATE 100 MG PO CAPS
100.0000 mg | ORAL_CAPSULE | ORAL | Status: AC
  Administered 2020-08-10: 100 mg via ORAL
  Filled 2020-08-10: qty 1

## 2020-08-10 MED ORDER — INSULIN ASPART 100 UNIT/ML ~~LOC~~ SOLN
0.0000 [IU] | Freq: Three times a day (TID) | SUBCUTANEOUS | Status: DC
  Administered 2020-08-10 (×2): 5 [IU] via SUBCUTANEOUS

## 2020-08-10 MED ORDER — AZITHROMYCIN 250 MG PO TABS
250.0000 mg | ORAL_TABLET | Freq: Every day | ORAL | Status: AC
  Administered 2020-08-11 – 2020-08-12 (×2): 250 mg via ORAL
  Filled 2020-08-10 (×2): qty 1

## 2020-08-10 MED ORDER — PHENOL 1.4 % MT LIQD
1.0000 | OROMUCOSAL | Status: DC | PRN
  Filled 2020-08-10: qty 177

## 2020-08-10 MED ORDER — PANTOPRAZOLE SODIUM 40 MG PO TBEC
40.0000 mg | DELAYED_RELEASE_TABLET | Freq: Two times a day (BID) | ORAL | Status: DC
  Administered 2020-08-10 – 2020-08-12 (×5): 40 mg via ORAL
  Filled 2020-08-10 (×5): qty 1

## 2020-08-10 MED ORDER — POTASSIUM CHLORIDE CRYS ER 20 MEQ PO TBCR
40.0000 meq | EXTENDED_RELEASE_TABLET | Freq: Two times a day (BID) | ORAL | Status: AC
  Administered 2020-08-10 (×2): 40 meq via ORAL
  Filled 2020-08-10 (×2): qty 2

## 2020-08-10 MED ORDER — PEG-KCL-NACL-NASULF-NA ASC-C 100 G PO SOLR: 1.0000 | Freq: Once | ORAL | Status: DC

## 2020-08-10 MED ORDER — INSULIN GLARGINE 100 UNIT/ML ~~LOC~~ SOLN
20.0000 [IU] | Freq: Every day | SUBCUTANEOUS | Status: DC
  Administered 2020-08-10: 20 [IU] via SUBCUTANEOUS
  Filled 2020-08-10 (×2): qty 0.2

## 2020-08-10 MED ORDER — PNEUMOCOCCAL VAC POLYVALENT 25 MCG/0.5ML IJ INJ
0.5000 mL | INJECTION | INTRAMUSCULAR | Status: AC
  Administered 2020-08-11: 0.5 mL via INTRAMUSCULAR
  Filled 2020-08-10: qty 0.5

## 2020-08-10 MED ORDER — FLUCONAZOLE 150 MG PO TABS
150.0000 mg | ORAL_TABLET | Freq: Once | ORAL | Status: AC
  Administered 2020-08-10: 150 mg via ORAL
  Filled 2020-08-10: qty 1

## 2020-08-10 MED ORDER — PROSOURCE PLUS PO LIQD
30.0000 mL | Freq: Two times a day (BID) | ORAL | Status: DC
  Administered 2020-08-11 – 2020-08-12 (×4): 30 mL via ORAL
  Filled 2020-08-10 (×4): qty 30

## 2020-08-10 MED ORDER — PEG-KCL-NACL-NASULF-NA ASC-C 100 G PO SOLR
0.5000 | Freq: Once | ORAL | Status: AC
  Administered 2020-08-10: 100 g via ORAL
  Filled 2020-08-10 (×2): qty 1

## 2020-08-10 MED ORDER — HYDROCORTISONE 1 % EX CREA
TOPICAL_CREAM | Freq: Once | CUTANEOUS | Status: AC
  Filled 2020-08-10: qty 28

## 2020-08-10 NOTE — Consult Note (Addendum)
Referring Provider:  [facility]         Primary Care Physician:  Sanjuan Dame, MD Primary Gastroenterologist:  Thornton Park, MD We were asked to see this patient for:    anemia            ASSESSMENT / PLAN:    # 40 yo female undergoing outpatient workup with [place] for anal fissure, unintentional weight loss, upper abdominal pain, nausea, rectal bleeding and reports of black stool ( none in two weeks).  She has starting "vomiting" over last few days but this seems to be triggered by accumulation of phlegm / coughing.   Now admitted with PE. History of IDA on oral iron. Now with declining hemoglobin ( despite ongoing oral iron supplements).  --She has heavy and irregular menstrual cycles, probably contributing to history of IDA. With reports of black stool dating back to December ( now resolved), need to consider UGI bleed. Rectal bleeding was felt to be related to anal fissure seen on attempted colonoscopy early March.  --For evaluation of upper GI symptoms and declining hgb patient needs an upper endoscopy. She needs colonoscopy as well given the anemia, rectal bleeding and [facility] of colon cancer in father. Given need for anticoagulation we will likely pursue endoscopic evaluation while admitted. The risks and benefits of EGD and colonoscopy with possible polypectomy / biopsies were discussed and the patient agrees to proceed. She had an inadequate prep before. Will prep today and reevaluate response in am. If bowels adequately purged then will purse EGD / colonoscopy tomorrow afternoon. We will hold IV Heparin for 3-4 hours tomorrow afternoon if decision is made to proceed with procedures.  --Will add PO PPI  # Anal fissure, seen on attempted colonscopy. She completed several weeks of NTG gel. Still has rectal discomfort but no further bleeding. Further evaluation at time of colonoscopy   # Submassive pulmonary embolism with right heart strain.  She was taking oral contraceptives but  sounds like only for a month or two ( stopped in December). She does not use tobacco.  No recent travel. Aunt had blood clots. On IV heparin.     HPI:                                                                                                                             Chief Complaint: nausea, vomiting, vomiting, abdominal pain  Dana Hughes is a 40 y.o. female [facility] colon cancer in father, asthma, anxiety, depression, headaches, hypertension, diabetes,  cholecystectomy  Patient was seen in our office late February 2022 for evaluation of intermittent epigastric pian, LUQ pain relieved with defecation,  alternating diarrhea and constipation, melena with bright red blood, unintentional weight loss, and family history of colon cancer in father diagnosed at age 34 .  She was given a trial of dicyclomine for abdominal pain, nitroglycerin gel for presumed anal fissure .  She was scheduled for an abdominal ultrasound as well as  a colonoscopy.  CBC later that day was fairly unremarkable. Hgb was stable at 11.9.  Abdominal ultrasound showed only fatty liver disease.  Colonoscopy was incomplete due to poor prep but a posterior anal fissure was found  Patient presented to the ED on 08/08/2020 with cough, sneezing, chest pain.  She had seen her PCP, COVID testing negative`` in the ED patient was tachycardic .  Oxygen saturation was normal .  Found to have multifocal pneumonia and PE with right heart strain. Started on heparin drip . Her white count was 15,000, hemoglobin 9.7.  Started on antibiotics.  IR was consulted for catheter directed tPA.  No evidence for DVTs.   Patient describes intemittent nausea, diminished appetite since around January. Her weight has decreased from 107 pounds in December to 180 pounds today.Over the last few days she has been vomiting but this seems to be more associated with 'gagging' on phlegm.   Patient hemoglobin has declined since we saw her in February. It was 11.9 late  February now 9.7 ( admission) >> 8.2 today.She hasn't had any black stools for a couple of weeks. She takes oral iron which normally turns her stool dark brown. Her appetite waxes and wanes and she has intermittent nausea. Over the last few days she has had vomiting triggered by build up of phlegm / cough. She has documented recent weight loss of 20 pounds.  She no longer has LUQ pain described at clinic visit but now has intermittent, non-radiating RUQ pain improved after defecation. The pain is not related to eating. She is on a daily PPI. She been using NTG gel for anal fissure since it was prescribed in late February. She continues to have rectal discomfort with BMs but no further bleeding.   PREVIOUS ENDOSCOPIC EVALUATIONS / PERTINENT STUDIES   06/22/2020 colonoscopy -- Poor prep.  Extent of exam only to the sigmoid colon -- Posterior anal fissure was found.  Plan was for repeat colonoscopy with a 2-day bowel prep  07/11/2020 abdominal ultrasound --Fatty liver.   08/08/20 Echocardiogram --08/08/20 Echocardiogram: LVEF 45-50% with LV global hypokinesis, mild asymmetric LV hypertrophy of the basal-septal segment. Right ventricular pressure overload. RV size is mild-to-moderately enlarged.    Past Medical History:  Diagnosis Date  . Anxiety   . Arthritis   . Asthma   . Bronchitis   . BV (bacterial vaginosis)   . Candidiasis   . Depression   . Diabetes mellitus without complication ([place])   . Endometriosis   . Headache(784.0)    migraines  . Hypertension    no longer takes meds  . Migraines   . Muscle spasm   . Ovarian cyst   . Pelvic pain in female 09/29/2015  . Scoliosis   . Uterine fibroid     Past Surgical History:  Procedure Laterality Date  . CHOLECYSTECTOMY    . TUBAL LIGATION      Prior to Admission medications   Medication Sig Start Date End Date Taking? Authorizing Provider  albuterol (PROVENTIL HFA;VENTOLIN HFA) 108 (90 Base) MCG/ACT inhaler Inhale 2 puffs into the  lungs every 6 (six) hours as needed for wheezing or shortness of breath. Reported on 09/29/2015   Yes [provider]  albuterol (PROVENTIL) (2.5 MG/3ML) 0.083% nebulizer solution Take 3 mLs (2.5 mg total) by nebulization every 6 (six) hours as needed for wheezing or shortness of breath. 07/02/18  Yes Wurst, [place], PA-C  AMBULATORY NON FORMULARY MEDICATION Nitroglycerin 0.125% gel apply a small amount to the rectum  3 times daily x 6-8 weeks. 06/14/20  Yes Thornton Park, MD  AMBULATORY NON FORMULARY MEDICATION Medication Name: nitroglycerin 0.125 % gel applied to the rectum TID FOR 6 WEEKS 6-8 WEEKS ( NOT TO BE USED WITH CONCURRENT PHOSPHODIESTERASE INHIBITORS). 06/22/20  Yes Thornton Park, MD  amLODipine (NORVASC) 5 MG tablet Take 1 tablet (5 mg total) by mouth daily. 02/19/20  Yes Bloomfield, Carley D, DO  ARIPiprazole (ABILIFY) 5 MG tablet Take 1 tablet (5 mg total) by mouth daily. 06/03/20  Yes Eulis Canner E, NP  artificial tears (LACRILUBE) OINT ophthalmic ointment Place into both eyes at bedtime as needed for dry eyes. 05/02/18  Yes Murray, Alyssa B, PA-C  benzonatate (TESSALON) 100 MG capsule Take 1 capsule (100 mg total) by mouth 3 (three) times daily as needed for cough. 07/06/20  Yes Sanjuan Dame, MD  busPIRone (BUSPAR) 10 MG tablet Take 1 tablet (10 mg total) by mouth 3 (three) times daily. 06/03/20  Yes Eulis Canner E, NP  dicyclomine (BENTYL) 10 MG capsule Take 1 capsule (10 mg total) by mouth 4 (four) times daily as needed for spasms (abdominal pain). 06/20/20  Yes Jose Persia, MD  Exenatide ER (BYDUREON BCISE) 2 MG/0.85ML AUIJ Inject 2 mg into the skin once a week. 07/19/20  Yes Sanjuan Dame, MD  fluticasone (FLONASE) 50 MCG/ACT nasal spray Place 1 spray into both nostrils in the morning and at bedtime. Patient taking differently: Place 1 spray into both nostrils daily as needed. 06/20/20  Yes Jose Persia, MD  glucose blood test strip Check your sugar in  the morning before you eat breakfast, and one hour after a meal. 11/12/19  Yes Melynda Ripple, MD  glucose monitoring kit (FREESTYLE) monitoring kit 1 each by Does not apply route daily. Check glucose once in the morning before breakfast and 1 hour after a meal 11/12/19  Yes Melynda Ripple, MD  guaifenesin (ROBITUSSIN) 100 MG/5ML syrup Take 200 mg by mouth 3 (three) times daily as needed for cough.   Yes [provider]  hydrOXYzine (ATARAX/VISTARIL) 10 MG tablet Take 1 tablet (10 mg total) by mouth 3 (three) times daily as needed. Patient taking differently: Take 10 mg by mouth 3 (three) times daily as needed for anxiety. 06/03/20  Yes Eulis Canner E, NP  insulin aspart (NOVOLOG) 100 UNIT/ML FlexPen Inject 10 Units into the skin 2 (two) times daily with a meal. 06/03/20  Yes Jose Persia, MD  insulin glargine (LANTUS SOLOSTAR) 100 UNIT/ML Solostar Pen Inject 20 Units into the skin daily at 10 pm. Patient taking differently: Inject 20 Units into the skin at bedtime. 06/03/20  Yes Jose Persia, MD  Insulin Pen Needle (UNIFINE PEN NEEDLES) 32G X 4 MM MISC 1 application by Does not apply route in the morning, at noon, and at bedtime. 02/19/20  Yes Bloomfield, Carley D, DO  meclizine (ANTIVERT) 25 MG tablet Take 1 tablet (25 mg total) by mouth 3 (three) times daily as needed for dizziness. 05/06/18  Yes Melynda Ripple, MD  metFORMIN (GLUCOPHAGE) 1000 MG tablet Take 1 tablet (1,000 mg total) by mouth 2 (two) times daily with a meal. 12/09/19 12/08/20 Yes Masoudi, Elhamalsadat, MD  mirtazapine (REMERON) 30 MG tablet Take 1 tablet (30 mg total) by mouth at bedtime. 06/03/20  Yes Eulis Canner E, NP  omeprazole (PRILOSEC) 20 MG capsule Take 1 capsule (20 mg total) by mouth daily before breakfast. 05/25/20  Yes Jose Persia, MD  Polysaccharide Iron Complex 150 MG TABS Take 150 mg by mouth  daily. 04/07/20  Yes Sanjuan Dame, MD  Pseudoeph-Doxylamine-DM-APAP (NYQUIL PO) Take 1 Dose by  mouth at bedtime as needed (cold/cough).   Yes [provider]  Pseudoephedrine-APAP-DM (DAYQUIL PO) Take 1 Dose by mouth daily as needed (cold/cough).   Yes [provider]  diclofenac Sodium (VOLTAREN) 1 % GEL Apply 2 g topically 4 (four) times daily. Patient not taking: No sig reported 12/09/19   Masoudi, Dorthula Rue, MD  megestrol (MEGACE) 40 MG tablet Take 1 tablet (40 mg total) by mouth 2 (two) times daily. Can increase to two tablets twice a day in the event of heavy bleeding Patient not taking: No sig reported 05/05/20   Chancy Milroy, MD    Current Facility-Administered Medications  Medication Dose Route Frequency Provider Last Rate Last Admin  . acetaminophen (TYLENOL) tablet 1,000 mg  1,000 mg Oral Q6H PRN Alexandria Lodge, MD   1,000 mg at 08/10/20 0109   Or  . acetaminophen (TYLENOL) suppository 650 mg  650 mg Rectal Q6H PRN [place] Lodge, MD      . albuterol (VENTOLIN HFA) 108 (90 Base) MCG/ACT inhaler 2 puff  2 puff Inhalation Q6H PRN Masoudi, Elhamalsadat, MD      . ARIPiprazole (ABILIFY) tablet 5 mg  5 mg Oral Daily Masoudi, Elhamalsadat, MD   5 mg at 08/10/20 0904  . azithromycin (ZITHROMAX) tablet 250 mg  250 mg Oral Daily Masoudi, Elhamalsadat, MD   250 mg at 08/10/20 0904  . busPIRone (BUSPAR) tablet 10 mg  10 mg Oral TID Masoudi, Elhamalsadat, MD   10 mg at 08/10/20 0904  . cefTRIAXone (ROCEPHIN) 1 g in sodium chloride 0.9 % 100 mL IVPB  1 g Intravenous Q24H Masoudi, Elhamalsadat, MD 200 mL/hr at 08/10/20 0429 1 g at 08/10/20 0429  . fluconazole (DIFLUCAN) tablet 150 mg  150 mg Oral Once Alexandria Lodge, MD      . guaiFENesin (ROBITUSSIN) 100 MG/5ML solution 100 mg  5 mL Oral Q6H PRN Alexandria Lodge, MD   100 mg at 08/10/20 0428  . heparin ADULT infusion 100 units/mL (25000 units/232m)  1,550 Units/hr Intravenous Continuous CDonnamae Jude RPH 15.5 mL/hr at 08/10/20 0906 1,550 Units/hr at 08/10/20 0906  . insulin aspart (novoLOG) injection 0-15 Units  0-15  Units Subcutaneous TID WC Christian, [name], MD      . insulin aspart (novoLOG) injection 4 Units  4 Units Subcutaneous TID WC Christian, [name], MD      . insulin glargine (LANTUS) injection 20 Units  20 Units Subcutaneous QHS WAlexandria Lodge MD      . oxyCODONE (Oxy IR/ROXICODONE) immediate release tablet 5 mg  5 mg Oral Q6H PRN WAlexandria Lodge MD   5 mg at 08/10/20 0428  . phenol (CHLORASEPTIC) mouth spray 1 spray  1 spray Mouth/Throat PRN MSid Falcon MD      . [Derrill MemoON 08/11/2020] pneumococcal 23 valent vaccine (PNEUMOVAX-23) injection 0.5 mL  0.5 mL Intramuscular Tomorrow-1000 MGilles ChiquitoB, MD      . potassium chloride SA (KLOR-CON) CR tablet 40 mEq  40 mEq Oral BID WAlexandria Lodge MD   40 mEq at 08/10/20 03235   Allergies as of 08/08/2020 - Review Complete 08/08/2020  Allergen Reaction Noted  . Mushroom extract complex Anaphylaxis 06/05/2014  . Latex Itching and Swelling 07/07/2017  . Penicillins Itching 01/18/2011    Family History  Problem Relation Age of Onset  . Cancer Father   . Hypertension Maternal Aunt   . Breast cancer Maternal Aunt   .  Cancer Paternal Grandmother   . Breast cancer Paternal Grandmother   . Breast cancer Paternal Aunt     Social History   Socioeconomic History  . Marital status: Single    Spouse name: Not on file  . Number of children: Not on file  . Years of education: Not on file  . Highest education level: Not on file  Occupational History  . Not on file  Tobacco Use  . Smoking status: Former Research scientist (life sciences)  . Smokeless tobacco: Never Used  Vaping Use  . Vaping Use: Never used  Substance and Sexual Activity  . Alcohol use: Not Currently  . Drug use: Yes    Frequency: 1.0 times per week    Types: Marijuana  . Sexual activity: Yes    Birth control/protection: Condom, Surgical  Other Topics Concern  . Not on file  Social History Narrative  . Not on file   Social Determinants of Health   Financial Resource Strain: Not on file  Food  Insecurity: Food Insecurity Present  . Worried About Charity fundraiser in the Last Year: Sometimes true  . Ran Out of Food in the Last Year: Often true  Transportation Needs: No Transportation Needs  . Lack of Transportation (Medical): No  . Lack of Transportation (Non-Medical): No  Physical Activity: Not on file  Stress: Not on file  Social Connections: Not on file  Intimate Partner Violence: Not on file    Review of Systems: All systems reviewed and negative except where noted in HPI.  OBJECTIVE:    Physical Exam: Vital signs in last 24 hours: Temp:  [98 F (36.7 C)-99.1 F (37.3 C)] 98.6 F (37 C) (04/20 1141) Pulse Rate:  [79-99] 79 (04/20 1141) Resp:  [20-26] 21 (04/20 1141) BP: (98-108)/(61-68) 102/64 (04/20 1141) SpO2:  [95 %-100 %] 99 % (04/20 1141) Last BM Date: 08/08/20 General:   Alert  female in NAD Psych:  Pleasant, cooperative. Normal mood and affect. Eyes:  Pupils equal, sclera clear, no icterus.   Conjunctiva pink. Ears:  Normal auditory acuity. Nose:  No deformity, discharge,  or lesions. Neck:  Supple; no masses Lungs:  Clear throughout to auscultation.   No wheezes, crackles, or rhonchi.  Heart:  Regular rate and rhythm; no murmurs, no lower extremity edema Abdomen:  Soft, non-distended, nontender, BS active, no palp mass   Rectal:  Deferred  Msk:  Symmetrical without gross deformities. . Neurologic:  Alert and  oriented x4;  grossly normal neurologically. Skin:  Intact without significant lesions or rashes.  Filed Weights   08/08/20 0020  Weight: 82 kg     Scheduled inpatient medications . ARIPiprazole  5 mg Oral Daily  . azithromycin  250 mg Oral Daily  . busPIRone  10 mg Oral TID  . fluconazole  150 mg Oral Once  . insulin aspart  0-15 Units Subcutaneous TID WC  . insulin aspart  4 Units Subcutaneous TID WC  . insulin glargine  20 Units Subcutaneous QHS  . [START ON 08/11/2020] pneumococcal 23 valent vaccine  0.5 mL Intramuscular  Tomorrow-1000  . potassium chloride  40 mEq Oral BID      Intake/Output from previous day: 04/19 0701 - 04/20 0700 In: 932.8 [P.O.:600; I.V.:232.8; IV Piggyback:100] Out: 1000 [Urine:1000] Intake/Output this shift: Total I/O In: 71.6 [I.V.:71.6] Out: -    Lab Results: Recent Labs    08/09/20 0322 08/09/20 1635 08/10/20 0327  WBC 11.3* 10.0 11.1*  HGB 8.3* 9.7* 8.2*  HCT 27.5* 32.4*  27.5*  PLT 381 398 350   BMET Recent Labs    08/08/20 0135 08/09/20 0322 08/10/20 0327  NA 134* 132* 136  K 2.9* 3.5 3.2*  CL 99 104 107  CO2 24 22 21*  GLUCOSE 308* 284* 189*  BUN <5* <5* <5*  CREATININE 0.69 0.68 0.65  CALCIUM 8.8* 8.4* 8.5*   LFT Recent Labs    08/10/20 0327  PROT 5.4*  ALBUMIN 2.4*  AST 21  ALT 33  ALKPHOS 52  BILITOT 0.2*   PT/INR Recent Labs    08/08/20 0518  LABPROT 15.1  INR 1.2   Hepatitis Panel No results for input(s): HEPBSAG, HCVAB, HEPAIGM, HEPBIGM in the last 72 hours.   . CBC Latest Ref Rng & Units 08/10/2020 08/09/2020 08/09/2020  WBC 4.0 - 10.5 K/uL 11.1(H) 10.0 11.3(H)  Hemoglobin 12.0 - 15.0 g/dL 8.2(L) 9.7(L) 8.3(L)  Hematocrit 36.0 - 46.0 % 27.5(L) 32.4(L) 27.5(L)  Platelets 150 - 400 K/uL 350 398 381    . CMP Latest Ref Rng & Units 08/10/2020 08/09/2020 08/08/2020  Glucose 70 - 99 mg/dL 189(H) 284(H) 308(H)  BUN 6 - 20 mg/dL <5(L) <5(L) <5(L)  Creatinine 0.44 - 1.00 mg/dL 0.65 0.68 0.69  Sodium 135 - 145 mmol/L 136 132(L) 134(L)  Potassium 3.5 - 5.1 mmol/L 3.2(L) 3.5 2.9(L)  Chloride 98 - 111 mmol/L 107 104 99  CO2 22 - 32 mmol/L 21(L) 22 24  Calcium 8.9 - 10.3 mg/dL 8.5(L) 8.4(L) 8.8(L)  Total Protein 6.5 - 8.1 g/dL 5.4(L) 5.8(L) -  Total Bilirubin 0.3 - 1.2 mg/dL 0.2(L) 0.4 -  Alkaline Phos 38 - 126 U/L 52 68 -  AST 15 - 41 U/L 21 44(H) -  ALT 0 - 44 U/L 33 44 -   Studies/Results: No results found.  Principal Problem:   Bilateral pulmonary embolism ([place]) Active Problems:   Hypertension   Hypokalemia    Hypomagnesemia   CAP (community acquired pneumonia)   T2DM (type 2 diabetes mellitus) ([place])   Unintentional weight loss    Tye Savoy, NP-C @  08/10/2020, 11:55 AM     Attending physician's note   I have taken a history, examined the patient and reviewed the chart. I agree with the Advanced Practitioner's note, impression and recommendations.  40 year old female admitted with pulmonary embolism, also noted decline in hemoglobin  History of iron deficiency anemia and intermittent black stool  We will plan to proceed with EGD and colonoscopy for further evaluation, exclude neoplastic lesion, erosive gastritis or gastroduodenal ulcer  Monitor hemoglobin and transfuse to greater than 7 as needed  The risks and benefits as well as alternatives of endoscopic procedure(s) have been discussed and reviewed. All questions answered. The patient agrees to proceed.    Damaris Hippo , MD [phone]

## 2020-08-10 NOTE — Progress Notes (Signed)
ANTICOAGULATION CONSULT NOTE Pharmacy Consult for Heparin Indication: pulmonary embolus  Labs: Recent Labs    08/08/20 0135 08/08/20 0518 08/08/20 0600 08/08/20 0757 08/08/20 1405 08/08/20 2216 08/09/20 0322 08/09/20 1635 08/10/20 0327  HGB 9.7*  --   --   --   --   --  8.3* 9.7* 8.2*  HCT 30.9*  --   --   --   --   --  27.5* 32.4* 27.5*  PLT 358  --   --   --   --   --  381 398 350  APTT  --  24  --   --   --   --   --   --   --   LABPROT  --  15.1  --   --   --   --   --   --   --   INR  --  1.2  --   --   --   --   --   --   --   HEPARINUNFRC  --   --   --   --    < > 0.48 0.35  --  0.57  CREATININE 0.69  --   --   --   --   --  0.68  --  0.65  TROPONINIHS  --   --  16 11  --   --   --   --   --    < > = values in this interval not displayed.    Assessment: 40 y.o. W found to have acute submassive PE on CT. No anticoagulation prior to admission. Pharmacy consulted for heparin.    Heparin level 0.57 units/ml therapeutic. H/H anemic but stable, plt stable. No signs of bleeding per patient. Will target higher end of goal given clot burden.   Goal of Therapy:  Heparin level 0.3-0.7 units/ml Monitor platelets by anticoagulation protocol: Yes   Plan:  Continue heparin to 1550 units / hr Monitor daily HL, CBC/plt Monitor for signs/symptoms of bleeding  F/u long term plan - patient has no preference between apixaban and rivaroxaban   Benetta Spar, PharmD, BCPS, [facility] Clinical Pharmacist  Please check AMION for all [place] phone numbers After 10:00 PM, call [address]

## 2020-08-10 NOTE — Progress Notes (Addendum)
Initial Nutrition Assessment  DOCUMENTATION CODES:   Not applicable  INTERVENTION:   Boost Breeze po TID, each supplement provides 250 kcal and 9 grams of protein  59ml Prosource Plus po BID, each supplement provides 100 kcals and 15 grams of protein  MVI with minerals daily  Provided diabetic diet education and will follow-up for reinforcement as able/as appropriate  Once diet can be advanced after procedure, recommend placing pt on regular diet to optimize intake and provide more calories/protein as pt does not like Ensure supplements -- regular diet will provide more kcals/protein than a Heart Healthy/Carb Modified diet.    NUTRITION DIAGNOSIS:   Unintentional weight loss related to nausea as evidenced by per patient/family report,percent weight loss.  GOAL:   Patient will meet greater than or equal to 90% of their needs  MONITOR:   PO intake,Supplement acceptance,Diet advancement,Weight trends,Labs,I & O's  REASON FOR ASSESSMENT:   Malnutrition Screening Tool   ASSESSMENT:   Pt with PMH significant for fatty liver, anxiety/depression, HTN, DM, cholecystectomy, and IDA (on oral iron) admitted with PE and now with declining hgb despite ongoing supplementation. Pt currently undergoing outpatient workup for anal fissure, unintentional weight loss, upper abdominal pain, nausea, rectal bleeding and reports of black stool since Dec (none in two weeks). Pt has started "vomiting" over the last few days, but this is reported to be more like phlegm/coughing.   Discussed pt with RN  Per MD, pt's rectal bleeding was felt to be related to anal fissure seen on attempted colonoscopy in early March. Pt completed several weeks of NTG gel and continues to have rectal discomfort, though no further bleeding. Pt now in need of EGD and colonoscopy. Pt agreeable to these procedures with possible polypectomy/biosies. GI notes that pt is to have prep today and if bowels are adequately purged, will  plan to pursue EGD/colonoscopy tomorrow afternoon.   Pt reports intermittent nausea/waxing and waning appetite since January 2022 and states episodes often last several days at a time. Pt states this is the first time she has experienced vomiting. Pt endorses unintentional weight loss over the last several months and attributes the weight loss to her attempts at getting her blood sugar more controlled. Pt then expressed that she has had continued difficulty stabilizing her blood glucose levels. Pt reports having attempted eating smaller portions more frequently, cutting out various food groups (red meat, fish/seafood, chicken), and several other strategies to achieve her blood sugar goals. RD provided pt with diabetes diet education and offered to provide handouts, recipes, and outpatient education referral. Discussed methods for stabilizing blood sugar with pt (including consistent carb consumption and nutrient pairing) and encouraged pt to incorporate lean proteins back into her diet. Pt expressed appreciation for education. Will provide all materials in outpatient summary and will follow-up with pt to reinforce education/answer additional questions/concerns as able/as appropriate.   Reports continued poor appetite throughout admission, though meal completion noted to be 100% x 2 documented meals. Pt states that she forces herself to eat because she knows it is important. Pt states she does not like Ensure, but will drink it if it is absolutely necessary. Pt is agreeable to Magic Cup (vanilla) and snacks once diet is advanced. Will order Prosource and Boost Breeze given clear liquid diet for the time being (downgraded to clear liquids today in preparation for possible procedure tomorrow; previously on Heart Healthy diet).   Per wt readings, pt weighed 94.5kg on 04/04/20 and now weighs 82kg, indicating a clinically significant weight  loss of 13% x 4 months, which is significant for time frame.   UOP: 1L x24  hours I/O:+2657.38ml since admit  Medications: SSI TID w/ meals, 4 units novolog TID w/ meals, 20 units lantus daily, protonix, prep, klor-con Labs: K+ 3.2 (L) CBGs [phone]  NUTRITION - FOCUSED PHYSICAL EXAM:  Flowsheet Row Most Recent Value  Orbital Region No depletion  Upper Arm Region No depletion  Thoracic and Lumbar Region No depletion  Buccal Region No depletion  Temple Region No depletion  Clavicle Bone Region No depletion  Clavicle and Acromion Bone Region No depletion  Scapular Bone Region No depletion  Dorsal Hand No depletion  Patellar Region No depletion  Anterior Thigh Region Mild depletion  Posterior Calf Region No depletion  Edema (RD Assessment) Mild  Hair Reviewed  Eyes Reviewed  Mouth Reviewed  Skin Reviewed  Nails Reviewed       Diet Order:   Diet Order            Diet NPO time specified  Diet effective midnight           Diet clear liquid Room service appropriate? Yes; Fluid consistency: Thin  Diet effective now                 EDUCATION NEEDS:   Education needs have been addressed  Skin:  Skin Assessment: Reviewed RN Assessment  Last BM:  4/18  Height:   Ht Readings from Last 1 Encounters:  08/08/20 5\' 3"  (1.6 m)    Weight:   Wt Readings from Last 1 Encounters:  08/08/20 82 kg   BMI:  Body mass index is 32.02 kg/m.  Estimated Nutritional Needs:   Kcal:  2050-2250  Protein:  100-115 grams  Fluid:  >2L    Larkin Ina, MS, RD, LDN RD pager number and weekend/on-call pager number located in [place].

## 2020-08-10 NOTE — Progress Notes (Signed)
HD#2 Subjective:   No acute events overnight. Patient reported vaginal itching to the night team.  During evaluation this morning, patient states she did not sleep well overnight secondary to beeping monitors. States her chest pain is improved after escalation of pain medications yesterday. Report she got up to use the restroom this morning and felt fine ambulating. Endorses 4-day history of vaginal itching and white discharge and states she feels similar to prior yeast infections.  Objective:   Vital signs in last 24 hours: Vitals:   08/09/20 1100 08/09/20 2113 08/09/20 2335 08/10/20 0320  BP: 102/73 108/61 100/68 98/67  Pulse: 94 99 92 81  Resp: 20 20 20 20   Temp: 98.3 F (36.8 C) 99.1 F (37.3 C) 99 F (37.2 C) 98 F (36.7 C)  TempSrc: Oral Oral Oral Oral  SpO2: 98% 100% 100% 97%  Weight:      Height:       Physical Exam Constitutional:tired- though well-appearing woman lying in bed, in no acute distress Cardiovascular:regular rate and rhythm, no m/r/g; no lower extremity edema Pulmonary/Chest: normal work of breathing on room air, periodic dry cough Abdominal: soft, non-tender, non-distended Skin: warm and dry  Pertinent Labs: CBC Latest Ref Rng & Units 08/10/2020 08/09/2020 08/09/2020  WBC 4.0 - 10.5 K/uL 11.1(H) 10.0 11.3(H)  Hemoglobin 12.0 - 15.0 g/dL 8.2(L) 9.7(L) 8.3(L)  Hematocrit 36.0 - 46.0 % 27.5(L) 32.4(L) 27.5(L)  Platelets 150 - 400 K/uL 350 398 381    CMP Latest Ref Rng & Units 08/10/2020 08/09/2020 08/08/2020  Glucose 70 - 99 mg/dL 189(H) 284(H) 308(H)  BUN 6 - 20 mg/dL <5(L) <5(L) <5(L)  Creatinine 0.44 - 1.00 mg/dL 0.65 0.68 0.69  Sodium 135 - 145 mmol/L 136 132(L) 134(L)  Potassium 3.5 - 5.1 mmol/L 3.2(L) 3.5 2.9(L)  Chloride 98 - 111 mmol/L 107 104 99  CO2 22 - 32 mmol/L 21(L) 22 24  Calcium 8.9 - 10.3 mg/dL 8.5(L) 8.4(L) 8.8(L)  Total Protein 6.5 - 8.1 g/dL 5.4(L) 5.8(L) -  Total Bilirubin 0.3 - 1.2 mg/dL 0.2(L) 0.4 -  Alkaline Phos 38 -  126 U/L 52 68 -  AST 15 - 41 U/L 21 44(H) -  ALT 0 - 44 U/L 33 44 -    Assessment/Plan:   Principal Problem:   Bilateral pulmonary embolism (HCC) Active Problems:   Hypertension   Hypokalemia   Hypomagnesemia   CAP (community acquired pneumonia)   T2DM (type 2 diabetes mellitus) ([place])   Unintentional weight loss   Patient Summary:  Dana Hughes is a 40 year old woman with pertinent PMH of asthma, hypertension, type 2 diabetes, chronic cough who presented with chronic cough and worsening shortness of breath and admitted for pulmonary embolism and community acquired pneumonia.  This is hospital day 2.  Submassive pulmonary embolism She remains on room air. BP stable. Pleuritic chest pain and pain with coughing improved today with escalation of pain regimen. As below, question whether possible colon cancer predisposed patient to clot. GI consulted. - continue IV heparin, will hold transition to PO until possible GI intervention inpatient vs outpatient - home Megace discontinued  - telemetry - pain regimen: continue Tylenol 1000 mg q6h PRN, add oxycodone 5 mg q6h for breakthrough pain - low threshold to call IR/PCCM if patient becomes hemodynamically unstable  Community acquired pneumonia Patient reports improvement in cough and associated pain.  - azithromycin/ceftriaxone day 3 - continue guaifenesin 100 mg q6h PRN - pain regimen as above - incentive spirometry  History  of iron deficiency anemia History of melena, epigastric abdominal pain Hemoglobin 11.9 one month ago, 9.7 on admission>8.3 yesterday>9.3 on repeat yesterday>8.2 this morning.  Patient denies melena or hematochezia. BUN <5, suggestive of lower GI bleed. Although hemoglobin relatively stable, will consult GI since transition to oral anticoagulation would complicate outpatient colonoscopy. - GI consulted, appreciate recs - Trend CBC  Yeast infection Patient reports 4-day history of vaginal itching and  white discharge consistent with prior yeast infections.  - Fluconazole 150 mg today then repeat dose in 72 hours if no improvement  History of hypertension - continue to hold antihypertensives in setting of soft BP and submassive PE  Type 2 diabetes Last A1c 10.8% in 05/2020. Patient's home diabetes regimen is Lantus 20 units QHS, Novolog 10 units twice daily, metformin 1000 mg twice daily, and recently started exenatide 2 mg once weekly. Fasting CBG 189 this morning. Will increase Lantus to home dose and add mealtime coverage.  - Lantus 18-->20 units QHS - Start Novolog 4 u WC - SSI moderate  Hypokalemia 3.2 this morning, Mg 1.9, will replete with Klorcon 40 mEq BID and repeat BMP and Mg tomorrow morning.   Diet: Heart Healthy IVF: None VTE: Heparin Code: Full   Please contact the on call pager after 5 pm and on weekends at [phone].  Alexandria Lodge, MD PGY-1 Internal Medicine Teaching Service Pager: [phone] 08/10/2020

## 2020-08-10 NOTE — H&P (View-Only) (Signed)
Referring Provider:  [facility]         Primary Care Physician:  Sanjuan Dame, MD Primary Gastroenterologist:  Thornton Park, MD We were asked to see this patient for:    anemia            ASSESSMENT / PLAN:    # 40 yo female undergoing outpatient workup with [place] for anal fissure, unintentional weight loss, upper abdominal pain, nausea, rectal bleeding and reports of black stool ( none in two weeks).  She has starting "vomiting" over last few days but this seems to be triggered by accumulation of phlegm / coughing.   Now admitted with PE. History of IDA on oral iron. Now with declining hemoglobin ( despite ongoing oral iron supplements).  --She has heavy and irregular menstrual cycles, probably contributing to history of IDA. With reports of black stool dating back to December ( now resolved), need to consider UGI bleed. Rectal bleeding was felt to be related to anal fissure seen on attempted colonoscopy early March.  --For evaluation of upper GI symptoms and declining hgb patient needs an upper endoscopy. She needs colonoscopy as well given the anemia, rectal bleeding and [facility] of colon cancer in father. Given need for anticoagulation we will likely pursue endoscopic evaluation while admitted. The risks and benefits of EGD and colonoscopy with possible polypectomy / biopsies were discussed and the patient agrees to proceed. She had an inadequate prep before. Will prep today and reevaluate response in am. If bowels adequately purged then will purse EGD / colonoscopy tomorrow afternoon. We will hold IV Heparin for 3-4 hours tomorrow afternoon if decision is made to proceed with procedures.  --Will add PO PPI  # Anal fissure, seen on attempted colonscopy. She completed several weeks of NTG gel. Still has rectal discomfort but no further bleeding. Further evaluation at time of colonoscopy   # Submassive pulmonary embolism with right heart strain.  She was taking oral contraceptives but  sounds like only for a month or two ( stopped in December). She does not use tobacco.  No recent travel. Aunt had blood clots. On IV heparin.     HPI:                                                                                                                             Chief Complaint: nausea, vomiting, vomiting, abdominal pain  Dana Hughes is a 40 y.o. female [facility] colon cancer in father, asthma, anxiety, depression, headaches, hypertension, diabetes,  cholecystectomy  Patient was seen in our office late February 2022 for evaluation of intermittent epigastric pian, LUQ pain relieved with defecation,  alternating diarrhea and constipation, melena with bright red blood, unintentional weight loss, and family history of colon cancer in father diagnosed at age 38 .  She was given a trial of dicyclomine for abdominal pain, nitroglycerin gel for presumed anal fissure .  She was scheduled for an abdominal ultrasound as well as  a colonoscopy.  CBC later that day was fairly unremarkable. Hgb was stable at 11.9.  Abdominal ultrasound showed only fatty liver disease.  Colonoscopy was incomplete due to poor prep but a posterior anal fissure was found  Patient presented to the ED on 08/08/2020 with cough, sneezing, chest pain.  She had seen her PCP, COVID testing negative`` in the ED patient was tachycardic .  Oxygen saturation was normal .  Found to have multifocal pneumonia and PE with right heart strain. Started on heparin drip . Her white count was 15,000, hemoglobin 9.7.  Started on antibiotics.  IR was consulted for catheter directed tPA.  No evidence for DVTs.   Patient describes intemittent nausea, diminished appetite since around January. Her weight has decreased from 107 pounds in December to 180 pounds today.Over the last few days she has been vomiting but this seems to be more associated with 'gagging' on phlegm.   Patient hemoglobin has declined since we saw her in February. It was 11.9 late  February now 9.7 ( admission) >> 8.2 today.She hasn't had any black stools for a couple of weeks. She takes oral iron which normally turns her stool dark brown. Her appetite waxes and wanes and she has intermittent nausea. Over the last few days she has had vomiting triggered by build up of phlegm / cough. She has documented recent weight loss of 20 pounds.  She no longer has LUQ pain described at clinic visit but now has intermittent, non-radiating RUQ pain improved after defecation. The pain is not related to eating. She is on a daily PPI. She been using NTG gel for anal fissure since it was prescribed in late February. She continues to have rectal discomfort with BMs but no further bleeding.   PREVIOUS ENDOSCOPIC EVALUATIONS / PERTINENT STUDIES   06/22/2020 colonoscopy -- Poor prep.  Extent of exam only to the sigmoid colon -- Posterior anal fissure was found.  Plan was for repeat colonoscopy with a 2-day bowel prep  07/11/2020 abdominal ultrasound --Fatty liver.   08/08/20 Echocardiogram --08/08/20 Echocardiogram: LVEF 45-50% with LV global hypokinesis, mild asymmetric LV hypertrophy of the basal-septal segment. Right ventricular pressure overload. RV size is mild-to-moderately enlarged.    Past Medical History:  Diagnosis Date  . Anxiety   . Arthritis   . Asthma   . Bronchitis   . BV (bacterial vaginosis)   . Candidiasis   . Depression   . Diabetes mellitus without complication ([place])   . Endometriosis   . Headache(784.0)    migraines  . Hypertension    no longer takes meds  . Migraines   . Muscle spasm   . Ovarian cyst   . Pelvic pain in female 09/29/2015  . Scoliosis   . Uterine fibroid     Past Surgical History:  Procedure Laterality Date  . CHOLECYSTECTOMY    . TUBAL LIGATION      Prior to Admission medications   Medication Sig Start Date End Date Taking? Authorizing Provider  albuterol (PROVENTIL HFA;VENTOLIN HFA) 108 (90 Base) MCG/ACT inhaler Inhale 2 puffs into the  lungs every 6 (six) hours as needed for wheezing or shortness of breath. Reported on 09/29/2015   Yes [provider]  albuterol (PROVENTIL) (2.5 MG/3ML) 0.083% nebulizer solution Take 3 mLs (2.5 mg total) by nebulization every 6 (six) hours as needed for wheezing or shortness of breath. 07/02/18  Yes Wurst, [place], PA-C  AMBULATORY NON FORMULARY MEDICATION Nitroglycerin 0.125% gel apply a small amount to the rectum  3 times daily x 6-8 weeks. 06/14/20  Yes Thornton Park, MD  AMBULATORY NON FORMULARY MEDICATION Medication Name: nitroglycerin 0.125 % gel applied to the rectum TID FOR 6 WEEKS 6-8 WEEKS ( NOT TO BE USED WITH CONCURRENT PHOSPHODIESTERASE INHIBITORS). 06/22/20  Yes Thornton Park, MD  amLODipine (NORVASC) 5 MG tablet Take 1 tablet (5 mg total) by mouth daily. 02/19/20  Yes Bloomfield, Carley D, DO  ARIPiprazole (ABILIFY) 5 MG tablet Take 1 tablet (5 mg total) by mouth daily. 06/03/20  Yes Eulis Canner E, NP  artificial tears (LACRILUBE) OINT ophthalmic ointment Place into both eyes at bedtime as needed for dry eyes. 05/02/18  Yes Murray, Alyssa B, PA-C  benzonatate (TESSALON) 100 MG capsule Take 1 capsule (100 mg total) by mouth 3 (three) times daily as needed for cough. 07/06/20  Yes Sanjuan Dame, MD  busPIRone (BUSPAR) 10 MG tablet Take 1 tablet (10 mg total) by mouth 3 (three) times daily. 06/03/20  Yes Eulis Canner E, NP  dicyclomine (BENTYL) 10 MG capsule Take 1 capsule (10 mg total) by mouth 4 (four) times daily as needed for spasms (abdominal pain). 06/20/20  Yes Jose Persia, MD  Exenatide ER (BYDUREON BCISE) 2 MG/0.85ML AUIJ Inject 2 mg into the skin once a week. 07/19/20  Yes Sanjuan Dame, MD  fluticasone (FLONASE) 50 MCG/ACT nasal spray Place 1 spray into both nostrils in the morning and at bedtime. Patient taking differently: Place 1 spray into both nostrils daily as needed. 06/20/20  Yes Jose Persia, MD  glucose blood test strip Check your sugar in  the morning before you eat breakfast, and one hour after a meal. 11/12/19  Yes Melynda Ripple, MD  glucose monitoring kit (FREESTYLE) monitoring kit 1 each by Does not apply route daily. Check glucose once in the morning before breakfast and 1 hour after a meal 11/12/19  Yes Melynda Ripple, MD  guaifenesin (ROBITUSSIN) 100 MG/5ML syrup Take 200 mg by mouth 3 (three) times daily as needed for cough.   Yes [provider]  hydrOXYzine (ATARAX/VISTARIL) 10 MG tablet Take 1 tablet (10 mg total) by mouth 3 (three) times daily as needed. Patient taking differently: Take 10 mg by mouth 3 (three) times daily as needed for anxiety. 06/03/20  Yes Eulis Canner E, NP  insulin aspart (NOVOLOG) 100 UNIT/ML FlexPen Inject 10 Units into the skin 2 (two) times daily with a meal. 06/03/20  Yes Jose Persia, MD  insulin glargine (LANTUS SOLOSTAR) 100 UNIT/ML Solostar Pen Inject 20 Units into the skin daily at 10 pm. Patient taking differently: Inject 20 Units into the skin at bedtime. 06/03/20  Yes Jose Persia, MD  Insulin Pen Needle (UNIFINE PEN NEEDLES) 32G X 4 MM MISC 1 application by Does not apply route in the morning, at noon, and at bedtime. 02/19/20  Yes Bloomfield, Carley D, DO  meclizine (ANTIVERT) 25 MG tablet Take 1 tablet (25 mg total) by mouth 3 (three) times daily as needed for dizziness. 05/06/18  Yes Melynda Ripple, MD  metFORMIN (GLUCOPHAGE) 1000 MG tablet Take 1 tablet (1,000 mg total) by mouth 2 (two) times daily with a meal. 12/09/19 12/08/20 Yes Masoudi, Elhamalsadat, MD  mirtazapine (REMERON) 30 MG tablet Take 1 tablet (30 mg total) by mouth at bedtime. 06/03/20  Yes Eulis Canner E, NP  omeprazole (PRILOSEC) 20 MG capsule Take 1 capsule (20 mg total) by mouth daily before breakfast. 05/25/20  Yes Jose Persia, MD  Polysaccharide Iron Complex 150 MG TABS Take 150 mg by mouth  daily. 04/07/20  Yes Sanjuan Dame, MD  Pseudoeph-Doxylamine-DM-APAP (NYQUIL PO) Take 1 Dose by  mouth at bedtime as needed (cold/cough).   Yes [provider]  Pseudoephedrine-APAP-DM (DAYQUIL PO) Take 1 Dose by mouth daily as needed (cold/cough).   Yes [provider]  diclofenac Sodium (VOLTAREN) 1 % GEL Apply 2 g topically 4 (four) times daily. Patient not taking: No sig reported 12/09/19   Masoudi, Dorthula Rue, MD  megestrol (MEGACE) 40 MG tablet Take 1 tablet (40 mg total) by mouth 2 (two) times daily. Can increase to two tablets twice a day in the event of heavy bleeding Patient not taking: No sig reported 05/05/20   Chancy Milroy, MD    Current Facility-Administered Medications  Medication Dose Route Frequency Provider Last Rate Last Admin  . acetaminophen (TYLENOL) tablet 1,000 mg  1,000 mg Oral Q6H PRN Alexandria Lodge, MD   1,000 mg at 08/10/20 5400   Or  . acetaminophen (TYLENOL) suppository 650 mg  650 mg Rectal Q6H PRN Alexandria Lodge, MD      . albuterol (VENTOLIN HFA) 108 (90 Base) MCG/ACT inhaler 2 puff  2 puff Inhalation Q6H PRN Masoudi, Elhamalsadat, MD      . ARIPiprazole (ABILIFY) tablet 5 mg  5 mg Oral Daily Masoudi, Elhamalsadat, MD   5 mg at 08/10/20 0904  . azithromycin (ZITHROMAX) tablet 250 mg  250 mg Oral Daily Masoudi, Elhamalsadat, MD   250 mg at 08/10/20 0904  . busPIRone (BUSPAR) tablet 10 mg  10 mg Oral TID Masoudi, Elhamalsadat, MD   10 mg at 08/10/20 0904  . cefTRIAXone (ROCEPHIN) 1 g in sodium chloride 0.9 % 100 mL IVPB  1 g Intravenous Q24H Masoudi, Elhamalsadat, MD 200 mL/hr at 08/10/20 0429 1 g at 08/10/20 0429  . fluconazole (DIFLUCAN) tablet 150 mg  150 mg Oral Once Alexandria Lodge, MD      . guaiFENesin (ROBITUSSIN) 100 MG/5ML solution 100 mg  5 mL Oral Q6H PRN Alexandria Lodge, MD   100 mg at 08/10/20 0428  . heparin ADULT infusion 100 units/mL (25000 units/262m)  1,550 Units/hr Intravenous Continuous CDonnamae Jude RPH 15.5 mL/hr at 08/10/20 0906 1,550 Units/hr at 08/10/20 0906  . insulin aspart (novoLOG) injection 0-15 Units  0-15  Units Subcutaneous TID WC Christian, [name], MD      . insulin aspart (novoLOG) injection 4 Units  4 Units Subcutaneous TID WC Christian, [name], MD      . insulin glargine (LANTUS) injection 20 Units  20 Units Subcutaneous QHS WAlexandria Lodge MD      . oxyCODONE (Oxy IR/ROXICODONE) immediate release tablet 5 mg  5 mg Oral Q6H PRN WAlexandria Lodge MD   5 mg at 08/10/20 0428  . phenol (CHLORASEPTIC) mouth spray 1 spray  1 spray Mouth/Throat PRN MSid Falcon MD      . [Derrill MemoON 08/11/2020] pneumococcal 23 valent vaccine (PNEUMOVAX-23) injection 0.5 mL  0.5 mL Intramuscular Tomorrow-1000 MGilles ChiquitoB, MD      . potassium chloride SA (KLOR-CON) CR tablet 40 mEq  40 mEq Oral BID WAlexandria Lodge MD   40 mEq at 08/10/20 08676   Allergies as of 08/08/2020 - Review Complete 08/08/2020  Allergen Reaction Noted  . Mushroom extract complex Anaphylaxis 06/05/2014  . Latex Itching and Swelling 07/07/2017  . Penicillins Itching 01/18/2011    Family History  Problem Relation Age of Onset  . Cancer Father   . Hypertension Maternal Aunt   . Breast cancer Maternal Aunt   .  Cancer Paternal Grandmother   . Breast cancer Paternal Grandmother   . Breast cancer Paternal Aunt     Social History   Socioeconomic History  . Marital status: Single    Spouse name: Not on file  . Number of children: Not on file  . Years of education: Not on file  . Highest education level: Not on file  Occupational History  . Not on file  Tobacco Use  . Smoking status: Former Research scientist (life sciences)  . Smokeless tobacco: Never Used  Vaping Use  . Vaping Use: Never used  Substance and Sexual Activity  . Alcohol use: Not Currently  . Drug use: Yes    Frequency: 1.0 times per week    Types: Marijuana  . Sexual activity: Yes    Birth control/protection: Condom, Surgical  Other Topics Concern  . Not on file  Social History Narrative  . Not on file   Social Determinants of Health   Financial Resource Strain: Not on file  Food  Insecurity: Food Insecurity Present  . Worried About Charity fundraiser in the Last Year: Sometimes true  . Ran Out of Food in the Last Year: Often true  Transportation Needs: No Transportation Needs  . Lack of Transportation (Medical): No  . Lack of Transportation (Non-Medical): No  Physical Activity: Not on file  Stress: Not on file  Social Connections: Not on file  Intimate Partner Violence: Not on file    Review of Systems: All systems reviewed and negative except where noted in HPI.  OBJECTIVE:    Physical Exam: Vital signs in last 24 hours: Temp:  [98 F (36.7 C)-99.1 F (37.3 C)] 98.6 F (37 C) (04/20 1141) Pulse Rate:  [79-99] 79 (04/20 1141) Resp:  [20-26] 21 (04/20 1141) BP: (98-108)/(61-68) 102/64 (04/20 1141) SpO2:  [95 %-100 %] 99 % (04/20 1141) Last BM Date: 08/08/20 General:   Alert  female in NAD Psych:  Pleasant, cooperative. Normal mood and affect. Eyes:  Pupils equal, sclera clear, no icterus.   Conjunctiva pink. Ears:  Normal auditory acuity. Nose:  No deformity, discharge,  or lesions. Neck:  Supple; no masses Lungs:  Clear throughout to auscultation.   No wheezes, crackles, or rhonchi.  Heart:  Regular rate and rhythm; no murmurs, no lower extremity edema Abdomen:  Soft, non-distended, nontender, BS active, no palp mass   Rectal:  Deferred  Msk:  Symmetrical without gross deformities. . Neurologic:  Alert and  oriented x4;  grossly normal neurologically. Skin:  Intact without significant lesions or rashes.  Filed Weights   08/08/20 0020  Weight: 82 kg     Scheduled inpatient medications . ARIPiprazole  5 mg Oral Daily  . azithromycin  250 mg Oral Daily  . busPIRone  10 mg Oral TID  . fluconazole  150 mg Oral Once  . insulin aspart  0-15 Units Subcutaneous TID WC  . insulin aspart  4 Units Subcutaneous TID WC  . insulin glargine  20 Units Subcutaneous QHS  . [START ON 08/11/2020] pneumococcal 23 valent vaccine  0.5 mL Intramuscular  Tomorrow-1000  . potassium chloride  40 mEq Oral BID      Intake/Output from previous day: 04/19 0701 - 04/20 0700 In: 932.8 [P.O.:600; I.V.:232.8; IV Piggyback:100] Out: 1000 [Urine:1000] Intake/Output this shift: Total I/O In: 71.6 [I.V.:71.6] Out: -    Lab Results: Recent Labs    08/09/20 0322 08/09/20 1635 08/10/20 0327  WBC 11.3* 10.0 11.1*  HGB 8.3* 9.7* 8.2*  HCT 27.5* 32.4*  27.5*  PLT 381 398 350   BMET Recent Labs    08/08/20 0135 08/09/20 0322 08/10/20 0327  NA 134* 132* 136  K 2.9* 3.5 3.2*  CL 99 104 107  CO2 24 22 21*  GLUCOSE 308* 284* 189*  BUN <5* <5* <5*  CREATININE 0.69 0.68 0.65  CALCIUM 8.8* 8.4* 8.5*   LFT Recent Labs    08/10/20 0327  PROT 5.4*  ALBUMIN 2.4*  AST 21  ALT 33  ALKPHOS 52  BILITOT 0.2*   PT/INR Recent Labs    08/08/20 0518  LABPROT 15.1  INR 1.2   Hepatitis Panel No results for input(s): HEPBSAG, HCVAB, HEPAIGM, HEPBIGM in the last 72 hours.   . CBC Latest Ref Rng & Units 08/10/2020 08/09/2020 08/09/2020  WBC 4.0 - 10.5 K/uL 11.1(H) 10.0 11.3(H)  Hemoglobin 12.0 - 15.0 g/dL 8.2(L) 9.7(L) 8.3(L)  Hematocrit 36.0 - 46.0 % 27.5(L) 32.4(L) 27.5(L)  Platelets 150 - 400 K/uL 350 398 381    . CMP Latest Ref Rng & Units 08/10/2020 08/09/2020 08/08/2020  Glucose 70 - 99 mg/dL 189(H) 284(H) 308(H)  BUN 6 - 20 mg/dL <5(L) <5(L) <5(L)  Creatinine 0.44 - 1.00 mg/dL 0.65 0.68 0.69  Sodium 135 - 145 mmol/L 136 132(L) 134(L)  Potassium 3.5 - 5.1 mmol/L 3.2(L) 3.5 2.9(L)  Chloride 98 - 111 mmol/L 107 104 99  CO2 22 - 32 mmol/L 21(L) 22 24  Calcium 8.9 - 10.3 mg/dL 8.5(L) 8.4(L) 8.8(L)  Total Protein 6.5 - 8.1 g/dL 5.4(L) 5.8(L) -  Total Bilirubin 0.3 - 1.2 mg/dL 0.2(L) 0.4 -  Alkaline Phos 38 - 126 U/L 52 68 -  AST 15 - 41 U/L 21 44(H) -  ALT 0 - 44 U/L 33 44 -   Studies/Results: No results found.  Principal Problem:   Bilateral pulmonary embolism ([place]) Active Problems:   Hypertension   Hypokalemia    Hypomagnesemia   CAP (community acquired pneumonia)   T2DM (type 2 diabetes mellitus) ([place])   Unintentional weight loss    Tye Savoy, NP-C @  08/10/2020, 11:55 AM     Attending physician's note   I have taken a history, examined the patient and reviewed the chart. I agree with the Advanced Practitioner's note, impression and recommendations.  40 year old female admitted with pulmonary embolism, also noted decline in hemoglobin  History of iron deficiency anemia and intermittent black stool  We will plan to proceed with EGD and colonoscopy for further evaluation, exclude neoplastic lesion, erosive gastritis or gastroduodenal ulcer  Monitor hemoglobin and transfuse to greater than 7 as needed  The risks and benefits as well as alternatives of endoscopic procedure(s) have been discussed and reviewed. All questions answered. The patient agrees to proceed.    Damaris Hippo , MD [phone]

## 2020-08-11 LAB — CBC
HCT: 28.5 % — ABNORMAL LOW (ref 36.0–46.0)
Hemoglobin: 8.6 g/dL — ABNORMAL LOW (ref 12.0–15.0)
MCH: 24.6 pg — ABNORMAL LOW (ref 26.0–34.0)
MCHC: 30.2 g/dL (ref 30.0–36.0)
MCV: 81.4 fL (ref 80.0–100.0)
Platelets: 412 10*3/uL — ABNORMAL HIGH (ref 150–400)
RBC: 3.5 MIL/uL — ABNORMAL LOW (ref 3.87–5.11)
RDW: 15 % (ref 11.5–15.5)
WBC: 9.5 10*3/uL (ref 4.0–10.5)
nRBC: 0 % (ref 0.0–0.2)

## 2020-08-11 LAB — BASIC METABOLIC PANEL
Anion gap: 8 (ref 5–15)
BUN: <5 mg/dL — ABNORMAL LOW (ref 6–20)
CO2: 21 mmol/L — ABNORMAL LOW (ref 22–32)
Calcium: 8.8 mg/dL — ABNORMAL LOW (ref 8.9–10.3)
Chloride: 111 mmol/L (ref 98–111)
Creatinine, Ser: 0.59 mg/dL (ref 0.44–1.00)
GFR, Estimated: >60 mL/min (ref >60)
Glucose, Bld: 113 mg/dL — ABNORMAL HIGH (ref 70–99)
Potassium: 3.8 mmol/L (ref 3.5–5.1)
Sodium: 140 mmol/L (ref 135–145)

## 2020-08-11 LAB — HEPARIN LEVEL (UNFRACTIONATED)
Heparin Unfractionated: 1.03 IU/mL — ABNORMAL HIGH (ref 0.30–0.70)
Heparin Unfractionated: 0.58 IU/mL (ref 0.30–0.70)

## 2020-08-11 LAB — GLUCOSE, CAPILLARY
Glucose-Capillary: 116 mg/dL — ABNORMAL HIGH (ref 70–99)
Glucose-Capillary: 182 mg/dL — ABNORMAL HIGH (ref 70–99)
Glucose-Capillary: 155 mg/dL — ABNORMAL HIGH (ref 70–99)
Glucose-Capillary: 248 mg/dL — ABNORMAL HIGH (ref 70–99)

## 2020-08-11 LAB — MAGNESIUM: Magnesium: 1.9 mg/dL (ref 1.7–2.4)

## 2020-08-11 MED ORDER — PEG-KCL-NACL-NASULF-NA ASC-C 100 G PO SOLR
0.5000 | Freq: Once | ORAL | Status: AC
  Administered 2020-08-11: 100 g via ORAL
  Filled 2020-08-11: qty 1

## 2020-08-11 MED ORDER — PEG-KCL-NACL-NASULF-NA ASC-C 100 G PO SOLR: 1.0000 | Freq: Once | ORAL | Status: DC

## 2020-08-11 MED ORDER — INSULIN GLARGINE 100 UNIT/ML ~~LOC~~ SOLN
10.0000 [IU] | Freq: Every day | SUBCUTANEOUS | Status: DC
  Administered 2020-08-11: 10 [IU] via SUBCUTANEOUS
  Filled 2020-08-11 (×2): qty 0.1

## 2020-08-11 MED ORDER — PEG-KCL-NACL-NASULF-NA ASC-C 100 G PO SOLR
0.5000 | Freq: Once | ORAL | Status: AC
  Administered 2020-08-12: 100 g via ORAL
  Filled 2020-08-11: qty 1

## 2020-08-11 NOTE — Progress Notes (Signed)
I spoke with RN this am. Patient consumed all but a cup of the bowel prep. Her last BM this am was soft, brown.  We will repeat bowel prep this evening.  I have rescheduled EGD and colonoscopy for tomorrow at 8:45 AM.  I have put an order in for Nursing and also pharmacy to hold IV heparin at 4 AM in preparation for procedures.  Clear liquid diet today

## 2020-08-11 NOTE — H&P (View-Only) (Signed)
I spoke with RN this am. Patient consumed all but a cup of the bowel prep. Her last BM this am was soft, brown.  We will repeat bowel prep this evening.  I have rescheduled EGD and colonoscopy for tomorrow at 8:45 AM.  I have put an order in for Nursing and also pharmacy to hold IV heparin at 4 AM in preparation for procedures.  Clear liquid diet today

## 2020-08-11 NOTE — Plan of Care (Signed)
  Problem: Education: Goal: Knowledge of General Education information will improve Description Including pain rating scale, medication(s)/side effects and non-pharmacologic comfort measures Outcome: Progressing   Problem: Clinical Measurements: Goal: Ability to maintain clinical measurements within normal limits will improve Outcome: Progressing   Problem: Activity: Goal: Risk for activity intolerance will decrease Outcome: Progressing   

## 2020-08-11 NOTE — Progress Notes (Signed)
ANTICOAGULATION CONSULT NOTE Pharmacy Consult for Heparin Indication: pulmonary embolus  Labs: Recent Labs    08/09/20 0322 08/09/20 1635 08/10/20 0327 08/11/20 0201 08/11/20 1145  HGB 8.3* 9.7* 8.2* 8.6*  --   HCT 27.5* 32.4* 27.5* 28.5*  --   PLT 381 398 350 412*  --   HEPARINUNFRC 0.35  --  0.57 1.03* 0.58  CREATININE 0.68  --  0.65 0.59  --     Assessment: 40 y.o. W found to have acute submassive PE on CT. No anticoagulation prior to admission. Pharmacy consulted for heparin.    Heparin level 0.58 units/ml is therapeutic. H/H anemic but stable, plt stable. No signs of bleeding. Per GI, hold heparin at 4am for procedure.  Goal of Therapy:  Heparin level 0.3-0.7 units/ml Monitor platelets by anticoagulation protocol: Yes    Plan:  Continue heparin to 1300 units/hr, stop at 4am for procedure F/u plan post-procedure Monitor for signs/symptoms of bleeding  F/u long term plan - patient has no preference between apixaban and rivaroxaban   Benetta Spar, PharmD, BCPS, [facility] Clinical Pharmacist  Please check AMION for all [place] phone numbers After 10:00 PM, call [place]

## 2020-08-11 NOTE — Progress Notes (Signed)
HD#3 Subjective:   No acute events overnight. Patient completed bowel prep overnight.  During evaluation this morning, patient appears frustrated initially. She states she is upset because she was up all night because of the bowel prep and intermittent pleuritic chest pain, and she just found out the EGD/colonoscopy are being rescheduled to tomorrow. She states overnight she felt the urge to go but felt like it was difficult to "get everything out," which she states has been her baseline recently. States, "my bowels are funny like that." She notes she is also dealing with some family issues at home. She apologizes for her frustration. When asked further about her chest pain, she reports it is the same pain she has been having and notes that overall it is improving. She states she has been up and out of bed without issue this morning.   Objective:   Vital signs in last 24 hours: Vitals:   08/10/20 1558 08/10/20 1912 08/10/20 2226 08/11/20 0350  BP: 106/80 110/81 117/79 107/73  Pulse: 77 82 87 72  Resp: 20 20 20 17   Temp: 98.6 F (37 C) 98.5 F (36.9 C) 98.3 F (36.8 C) 98.4 F (36.9 C)  TempSrc: Oral Oral Oral Oral  SpO2: 96% 100% 100% 99%  Weight:      Height:       Physical Exam Constitutional:well-appearing woman sitting on edge of bed, in no acute distress, initially frustrated but softens and is very pleasant by end of encounter Cardiovascular:regular rate and rhythm, no m/r/g; no lower extremity edema Pulmonary/Chest: normal work of breathing on room air, periodic dry cough Abdominal: soft, non-tender, non-distended Skin: warm and dry  Pertinent Labs: CBC Latest Ref Rng & Units 08/11/2020 08/10/2020 08/09/2020  WBC 4.0 - 10.5 K/uL 9.5 11.1(H) 10.0  Hemoglobin 12.0 - 15.0 g/dL 8.6(L) 8.2(L) 9.7(L)  Hematocrit 36.0 - 46.0 % 28.5(L) 27.5(L) 32.4(L)  Platelets 150 - 400 K/uL 412(H) 350 398    CMP Latest Ref Rng & Units 08/11/2020 08/10/2020 08/09/2020  Glucose 70 - 99  mg/dL 113(H) 189(H) 284(H)  BUN 6 - 20 mg/dL <5(L) <5(L) <5(L)  Creatinine 0.44 - 1.00 mg/dL 0.59 0.65 0.68  Sodium 135 - 145 mmol/L 140 136 132(L)  Potassium 3.5 - 5.1 mmol/L 3.8 3.2(L) 3.5  Chloride 98 - 111 mmol/L 111 107 104  CO2 22 - 32 mmol/L 21(L) 21(L) 22  Calcium 8.9 - 10.3 mg/dL 8.8(L) 8.5(L) 8.4(L)  Total Protein 6.5 - 8.1 g/dL - 5.4(L) 5.8(L)  Total Bilirubin 0.3 - 1.2 mg/dL - 0.2(L) 0.4  Alkaline Phos 38 - 126 U/L - 52 68  AST 15 - 41 U/L - 21 44(H)  ALT 0 - 44 U/L - 33 44    Assessment/Plan:   Principal Problem:   Bilateral pulmonary embolism (HCC) Active Problems:   Hypertension   Hypokalemia   Hypomagnesemia   CAP (community acquired pneumonia)   T2DM (type 2 diabetes mellitus) ([place])   Unintentional weight loss   Patient Summary:  Dana Hughes is a 40 year old woman with pertinent PMH of asthma, hypertension, type 2 diabetes, chronic cough who presented with chronic cough and worsening shortness of breath and admitted for pulmonary embolism and community acquired pneumonia.  This is hospital day 3.  Submassive pulmonary embolism She remains on room air. BP stable. Pleuritic chest pain and pain gradually improving.  As below, question whether possible colon cancer predisposed patient to clot. GI plans for EGD/colonoscopy tomorrow AM. - continue IV heparin, will hold  transition to PO until after EGD/colonoscopy - home Megace discontinued  - telemetry - pain regimen: continue Tylenol 1000 mg q6h PRN, add oxycodone 5 mg q6h for breakthrough pain - low threshold to call IR/PCCM if patient becomes hemodynamically unstable  Community acquired pneumonia Patient reports improvement in cough and associated pain.  - azithromycin/ceftriaxone day 4 - continue guaifenesin 100 mg q6h PRN - pain regimen as above - incentive spirometry  History of iron deficiency anemia History of intermittent melena, epigastric abdominal pain Hemoglobin stable at 8.6 this  morning. GI consulted and plan for repeat bowel prep this evening for EGD/colonoscopy tomorrow.  - GI consulted, EGD/colonoscopy tomorrow - Protonix 40 mg twice daily - Trend CBC  Yeast infection Yesterday, patient reported 4-day history of vaginal itching and white discharge consistent with prior yeast infections.  - Fluconazole 150 mg yesterday, will repeat dose 4/23 if minimal to no improvement in symptoms   History of hypertension Continue to hold antihypertensives in setting of soft BP and submassive PE  Type 2 diabetes Last A1c 10.8% in 05/2020. Patient's home diabetes regimen is Lantus 20 units QHS, Novolog 10 units twice daily, metformin 1000 mg twice daily, and recently started exenatide 2 mg once weekly. Fasting CBG 113 this morning. Given clear liquid diet and NPO at midnight, will decrease Lantus dose and hold mealtime coverage until after EGD/colonoscopy.  - Lantus 20 units QHS --> 10 u QHS - Hold Novolog 4 u WC - SSI moderate  Hypokalemia K 3.8 this morning after repletion. Mg stable at 1.9. Will continue to monitor electrolytes.   Diet: CLD today, NPO at midnight for EGD/colonoscopy; per RD, recommend regular diet once eating IVF: None VTE: Heparin Code: Full   Please contact the on call pager after 5 pm and on weekends at [phone].  Alexandria Lodge, MD PGY-1 Internal Medicine Teaching Service Pager: [phone] 08/11/2020

## 2020-08-11 NOTE — Progress Notes (Signed)
ANTICOAGULATION CONSULT NOTE Pharmacy Consult for Heparin Indication: pulmonary embolus  Labs: Recent Labs    08/08/20 0518 08/08/20 0600 08/08/20 0757 08/08/20 1405 08/09/20 0322 08/09/20 1635 08/10/20 0327 08/11/20 0201  HGB  --   --   --    < > 8.3* 9.7* 8.2* 8.6*  HCT  --   --   --    < > 27.5* 32.4* 27.5* 28.5*  PLT  --   --   --    < > 381 398 350 412*  APTT 24  --   --   --   --   --   --   --   LABPROT 15.1  --   --   --   --   --   --   --   INR 1.2  --   --   --   --   --   --   --   HEPARINUNFRC  --   --   --    < > 0.35  --  0.57 1.03*  CREATININE  --   --   --   --  0.68  --  0.65  --   TROPONINIHS  --  16 11  --   --   --   --   --    < > = values in this interval not displayed.    Assessment: 40 y.o. female with PE for heparin.  Heparin level supratherapeutic  Goal of Therapy:  Heparin level 0.3-0.7 units/ml Monitor platelets by anticoagulation protocol: Yes   Plan:  Decrease Heparin 1300 units/hr F/U after procedure today  Phillis Knack, PharmD, BCPS

## 2020-08-12 ENCOUNTER — Encounter (HOSPITAL_COMMUNITY)
Admission: EM | Disposition: A | Discharge status: Home / Self Care | Payer: Self-pay | Source: Home / Self Care | Attending: Internal Medicine

## 2020-08-12 ENCOUNTER — Inpatient Hospital Stay (HOSPITAL_COMMUNITY): Payer: Self-pay | Admitting: Certified Registered Nurse Anesthetist

## 2020-08-12 ENCOUNTER — Encounter (HOSPITAL_COMMUNITY): Payer: Self-pay | Admitting: Internal Medicine

## 2020-08-12 ENCOUNTER — Other Ambulatory Visit (HOSPITAL_COMMUNITY): Payer: Self-pay

## 2020-08-12 ENCOUNTER — Other Ambulatory Visit: Payer: Self-pay

## 2020-08-12 ENCOUNTER — Telehealth: Payer: Self-pay

## 2020-08-12 DIAGNOSIS — N809 Endometriosis, unspecified: Secondary | ICD-10-CM

## 2020-08-12 DIAGNOSIS — D649 Anemia, unspecified: Secondary | ICD-10-CM

## 2020-08-12 DIAGNOSIS — I517 Cardiomegaly: Secondary | ICD-10-CM

## 2020-08-12 DIAGNOSIS — Z860100 Personal history of colon polyps, unspecified: Secondary | ICD-10-CM | POA: Diagnosis present

## 2020-08-12 DIAGNOSIS — B373 Candidiasis of vulva and vagina: Secondary | ICD-10-CM

## 2020-08-12 DIAGNOSIS — B3731 Acute candidiasis of vulva and vagina: Secondary | ICD-10-CM

## 2020-08-12 DIAGNOSIS — D75839 Thrombocytosis, unspecified: Secondary | ICD-10-CM

## 2020-08-12 DIAGNOSIS — D500 Iron deficiency anemia secondary to blood loss (chronic): Secondary | ICD-10-CM

## 2020-08-12 DIAGNOSIS — K635 Polyp of colon: Secondary | ICD-10-CM

## 2020-08-12 DIAGNOSIS — D126 Benign neoplasm of colon, unspecified: Secondary | ICD-10-CM

## 2020-08-12 DIAGNOSIS — K3189 Other diseases of stomach and duodenum: Secondary | ICD-10-CM

## 2020-08-12 DIAGNOSIS — I5020 Unspecified systolic (congestive) heart failure: Secondary | ICD-10-CM | POA: Diagnosis present

## 2020-08-12 DIAGNOSIS — R933 Abnormal findings on diagnostic imaging of other parts of digestive tract: Secondary | ICD-10-CM | POA: Diagnosis present

## 2020-08-12 HISTORY — PX: COLONOSCOPY WITH PROPOFOL: SHX5780

## 2020-08-12 HISTORY — PX: BIOPSY: SHX5522

## 2020-08-12 HISTORY — DX: Thrombocytosis, unspecified: D75.839

## 2020-08-12 HISTORY — PX: ESOPHAGOGASTRODUODENOSCOPY (EGD) WITH PROPOFOL: SHX5813

## 2020-08-12 HISTORY — PX: POLYPECTOMY: SHX5525

## 2020-08-12 HISTORY — DX: Cardiomegaly: I51.7

## 2020-08-12 LAB — BASIC METABOLIC PANEL
Anion gap: 10 (ref 5–15)
BUN: <5 mg/dL — ABNORMAL LOW (ref 6–20)
CO2: 22 mmol/L (ref 22–32)
Calcium: 8.9 mg/dL (ref 8.9–10.3)
Chloride: 104 mmol/L (ref 98–111)
Creatinine, Ser: 0.55 mg/dL (ref 0.44–1.00)
GFR, Estimated: >60 mL/min (ref >60)
Glucose, Bld: 121 mg/dL — ABNORMAL HIGH (ref 70–99)
Potassium: 3.6 mmol/L (ref 3.5–5.1)
Sodium: 136 mmol/L (ref 135–145)

## 2020-08-12 LAB — CBC
HCT: 30.5 % — ABNORMAL LOW (ref 36.0–46.0)
Hemoglobin: 9.2 g/dL — ABNORMAL LOW (ref 12.0–15.0)
MCH: 24.3 pg — ABNORMAL LOW (ref 26.0–34.0)
MCHC: 30.2 g/dL (ref 30.0–36.0)
MCV: 80.7 fL (ref 80.0–100.0)
Platelets: 482 10*3/uL — ABNORMAL HIGH (ref 150–400)
RBC: 3.78 MIL/uL — ABNORMAL LOW (ref 3.87–5.11)
RDW: 15 % (ref 11.5–15.5)
WBC: 10.9 10*3/uL — ABNORMAL HIGH (ref 4.0–10.5)
nRBC: 0 % (ref 0.0–0.2)

## 2020-08-12 LAB — GLUCOSE, CAPILLARY
Glucose-Capillary: 104 mg/dL — ABNORMAL HIGH (ref 70–99)
Glucose-Capillary: 147 mg/dL — ABNORMAL HIGH (ref 70–99)

## 2020-08-12 LAB — MAGNESIUM: Magnesium: 2.1 mg/dL (ref 1.7–2.4)

## 2020-08-12 SURGERY — COLONOSCOPY WITH PROPOFOL
Anesthesia: Monitor Anesthesia Care

## 2020-08-12 MED ORDER — ACETAMINOPHEN 500 MG PO TABS
1000.0000 mg | ORAL_TABLET | Freq: Four times a day (QID) | ORAL | 0 refills | Status: AC | PRN
  Filled 2020-08-12 – 2021-05-25 (×3): qty 30, 4d supply, fill #0

## 2020-08-12 MED ORDER — APIXABAN 5 MG PO TABS
5.0000 mg | ORAL_TABLET | Freq: Two times a day (BID) | ORAL | 1 refills | Status: DC
  Filled 2020-08-12: qty 60, 30d supply, fill #0

## 2020-08-12 MED ORDER — LIDOCAINE 5 % EX PTCH
1.0000 | MEDICATED_PATCH | CUTANEOUS | Status: DC
  Administered 2020-08-12: 1 via TRANSDERMAL
  Filled 2020-08-12: qty 1

## 2020-08-12 MED ORDER — PANTOPRAZOLE SODIUM 40 MG PO TBEC
40.0000 mg | DELAYED_RELEASE_TABLET | Freq: Every day | ORAL | 0 refills | Status: DC
  Filled 2020-08-12 – 2021-06-14 (×2): qty 30, 30d supply, fill #0

## 2020-08-12 MED ORDER — APIXABAN 5 MG PO TABS
ORAL_TABLET | ORAL | 0 refills | Status: DC
  Filled 2020-08-12 (×2): qty 63, 30d supply, fill #0

## 2020-08-12 MED ORDER — PROPOFOL 500 MG/50ML IV EMUL
INTRAVENOUS | Status: DC | PRN
  Administered 2020-08-12: 125 ug/kg/min via INTRAVENOUS

## 2020-08-12 MED ORDER — LACTATED RINGERS IV SOLN: INTRAVENOUS | Status: DC

## 2020-08-12 MED ORDER — PROPOFOL 10 MG/ML IV BOLUS
INTRAVENOUS | Status: DC | PRN
  Administered 2020-08-12: 25 mg via INTRAVENOUS
  Administered 2020-08-12: 20 mg via INTRAVENOUS

## 2020-08-12 MED ORDER — OXYCODONE HCL 5 MG PO TABS
5.0000 mg | ORAL_TABLET | Freq: Four times a day (QID) | ORAL | 0 refills | Status: DC | PRN
  Filled 2020-08-12: qty 15, 4d supply, fill #0

## 2020-08-12 MED ORDER — APIXABAN 5 MG PO TABS: 5.0000 mg | ORAL_TABLET | Freq: Two times a day (BID) | ORAL | Status: DC

## 2020-08-12 MED ORDER — APIXABAN 5 MG PO TABS
10.0000 mg | ORAL_TABLET | Freq: Two times a day (BID) | ORAL | Status: DC
  Administered 2020-08-12: 10 mg via ORAL
  Filled 2020-08-12: qty 2

## 2020-08-12 SURGICAL SUPPLY — 25 items

## 2020-08-12 NOTE — Anesthesia Procedure Notes (Signed)
Procedure Name: MAC Date/Time: 08/12/2020 9:02 AM Performed by: Inda Coke, CRNA Pre-anesthesia Checklist: Patient identified, Emergency Drugs available, Suction available, Timeout performed and Patient being monitored Patient Re-evaluated:Patient Re-evaluated prior to induction Oxygen Delivery Method: Nasal cannula Induction Type: IV induction Dental Injury: Teeth and Oropharynx as per pre-operative assessment

## 2020-08-12 NOTE — TOC Transition Note (Addendum)
Transition of Care [facility]) - CM/SW Discharge Note   Patient Details  Name: Dana Hughes MRN: [id number] Date of Birth: 1980-07-09  Transition of Care [facility]) CM/SW Contact:  Angelita Ingles, RN Phone Number: [phone]  08/12/2020, 1:36 PM   Clinical Narrative:    [facility] consulted for medication assistance for Eliquis. [place] form has been completed and TOC pharmacy to deliver 30 day supply to bedside. Dr. Shon Baton states that patient is already affiliated with [facility]  and will receive subsequent refills through [facility]. Patient verified that she has follow up and is currently being seen at [facility]. No other needs noted at this time. TOC will sign off.    Final next level of care: Home/Self Care Barriers to Discharge: No Barriers Identified   Patient Goals and CMS Choice Patient states their goals for this hospitalization and ongoing recovery are:: Patient s states that she is ready for discharge      Discharge Placement                       Discharge Plan and Services                DME Arranged: N/A         HH Arranged: NA          Social Determinants of Health (SDOH) Interventions     Readmission Risk Interventions Readmission Risk Prevention Plan 08/12/2020  Transportation Screening Complete  PCP or Specialist Appt within 3-5 Days Complete  HRI or [place] (No Data)  Social Work Consult for [place] Planning/Counseling [place] Not Applicable  Medication Review [employer]) Referral to Pharmacy  Some recent data might be hidden

## 2020-08-12 NOTE — Anesthesia Preprocedure Evaluation (Signed)
Anesthesia Evaluation  Patient identified by MRN, date of birth, ID band Patient awake    Reviewed: Allergy & Precautions, H&P , NPO status , Patient's Chart, lab work & pertinent test results  Airway Mallampati: II   Neck ROM: full    Dental   Pulmonary asthma , former smoker,    breath sounds clear to auscultation       Cardiovascular hypertension,  Rhythm:regular Rate:Normal     Neuro/Psych  Headaches, PSYCHIATRIC DISORDERS Anxiety Depression    GI/Hepatic GI bleed   Endo/Other  diabetes, Insulin Dependent  Renal/GU      Musculoskeletal  (+) Arthritis ,   Abdominal   Peds  Hematology  (+) anemia ,   Anesthesia Other Findings   Reproductive/Obstetrics                             Anesthesia Physical Anesthesia Plan  ASA: II  Anesthesia Plan: MAC   Post-op Pain Management:    Induction: Intravenous  PONV Risk Score and Plan: 2 and Propofol infusion and Treatment may vary due to age or medical condition  Airway Management Planned: Nasal Cannula  Additional Equipment:   Intra-op Plan:   Post-operative Plan:   Informed Consent: I have reviewed the patients History and Physical, chart, labs and discussed the procedure including the risks, benefits and alternatives for the proposed anesthesia with the patient or authorized representative who has indicated his/her understanding and acceptance.     Dental advisory given  Plan Discussed with: CRNA, Anesthesiologist and Surgeon  Anesthesia Plan Comments:         Anesthesia Quick Evaluation

## 2020-08-12 NOTE — Anesthesia Postprocedure Evaluation (Signed)
Anesthesia Post Note  Patient: Dana Hughes  Procedure(s) Performed: COLONOSCOPY WITH PROPOFOL (N/A ) ESOPHAGOGASTRODUODENOSCOPY (EGD) WITH PROPOFOL (N/A ) POLYPECTOMY BIOPSY     Patient location during evaluation: Endoscopy Anesthesia Type: MAC Level of consciousness: awake and alert Pain management: pain level controlled Vital Signs Assessment: post-procedure vital signs reviewed and stable Respiratory status: spontaneous breathing, nonlabored ventilation, respiratory function stable and patient connected to nasal cannula oxygen Cardiovascular status: stable and blood pressure returned to baseline Postop Assessment: no apparent nausea or vomiting Anesthetic complications: no   No complications documented.  Last Vitals:  Vitals:   08/12/20 1033 08/12/20 1204  BP: 105/73 103/64  Pulse: 87 89  Resp: 20 20  Temp: 36.7 C 36.9 C  SpO2: 98% 98%    Last Pain:  Vitals:   08/12/20 1204  TempSrc: Oral  PainSc:                  [name] S

## 2020-08-12 NOTE — Progress Notes (Incomplete)
HD#4 Subjective:   No acute events overnight. Patient completed bowel prep overnight.  During evaluation this morning, ***  Objective:   Vital signs in last 24 hours: Vitals:   08/11/20 1612 08/11/20 1936 08/11/20 2232 08/12/20 0233  BP: 114/76 114/80 121/89 115/76  Pulse: 82 77 88 87  Resp: 18 16  18   Temp: 98 F (36.7 C) 98.3 F (36.8 C) 98.2 F (36.8 C) 98.3 F (36.8 C)  TempSrc: Oral Oral Oral Oral  SpO2: 100% 100% 100% 100%  Weight:      Height:       Physical Exam Constitutional:well-appearing woman sitting on edge of bed, in no acute distress, initially frustrated but softens and is very pleasant by end of encounter Cardiovascular:regular rate and rhythm, no m/r/g; no lower extremity edema Pulmonary/Chest: normal work of breathing on room air, periodic dry cough Abdominal: soft, non-tender, non-distended Skin: warm and dry  Pertinent Labs: CBC Latest Ref Rng & Units 08/12/2020 08/11/2020 08/10/2020  WBC 4.0 - 10.5 K/uL 10.9(H) 9.5 11.1(H)  Hemoglobin 12.0 - 15.0 g/dL 9.2(L) 8.6(L) 8.2(L)  Hematocrit 36.0 - 46.0 % 30.5(L) 28.5(L) 27.5(L)  Platelets 150 - 400 K/uL 482(H) 412(H) 350    CMP Latest Ref Rng & Units 08/12/2020 08/11/2020 08/10/2020  Glucose 70 - 99 mg/dL 121(H) 113(H) 189(H)  BUN 6 - 20 mg/dL <5(L) <5(L) <5(L)  Creatinine 0.44 - 1.00 mg/dL 0.55 0.59 0.65  Sodium 135 - 145 mmol/L 136 140 136  Potassium 3.5 - 5.1 mmol/L 3.6 3.8 3.2(L)  Chloride 98 - 111 mmol/L 104 111 107  CO2 22 - 32 mmol/L 22 21(L) 21(L)  Calcium 8.9 - 10.3 mg/dL 8.9 8.8(L) 8.5(L)  Total Protein 6.5 - 8.1 g/dL - - 5.4(L)  Total Bilirubin 0.3 - 1.2 mg/dL - - 0.2(L)  Alkaline Phos 38 - 126 U/L - - 52  AST 15 - 41 U/L - - 21  ALT 0 - 44 U/L - - 33    Assessment/Plan:   Principal Problem:   Bilateral pulmonary embolism (HCC) Active Problems:   Hypertension   Hypokalemia   Hypomagnesemia   CAP (community acquired pneumonia)   T2DM (type 2 diabetes mellitus) ([place])    Unintentional weight loss   Patient Summary:  Dana Hughes is a 40 year old woman with pertinent PMH of asthma, hypertension, type 2 diabetes, chronic cough who presented with chronic cough and worsening shortness of breath and admitted for pulmonary embolism and community acquired pneumonia.  This is hospital day 4.  Submassive pulmonary embolism She remains on room air. BP stable. Pleuritic chest pain and pain gradually improving.  As below, question whether possible colon cancer predisposed patient to clot. GI plans for EGD/colonoscopy tomorrow AM. - continue IV heparin, will hold transition to PO until after EGD/colonoscopy - home Megace discontinued  - telemetry - pain regimen: continue Tylenol 1000 mg q6h PRN, add oxycodone 5 mg q6h for breakthrough pain - low threshold to call IR/PCCM if patient becomes hemodynamically unstable  Community acquired pneumonia Patient reports improvement in cough and associated pain.  - azithromycin/ceftriaxone day 5 - continue guaifenesin 100 mg q6h PRN - pain regimen as above - incentive spirometry  History of iron deficiency anemia History of intermittent melena, epigastric abdominal pain Hemoglobin stable at 8.6 this morning. GI consulted and plan for repeat bowel prep this evening for EGD/colonoscopy tomorrow.  - GI consulted, EGD/colonoscopy tomorrow - Protonix 40 mg twice daily - Trend CBC  Yeast infection Yesterday, patient reported 4-day  history of vaginal itching and white discharge consistent with prior yeast infections.  - Fluconazole 150 mg yesterday, will repeat dose 4/23 if minimal to no improvement in symptoms   History of hypertension Continue to hold antihypertensives in setting of soft BP and submassive PE  Type 2 diabetes Last A1c 10.8% in 05/2020. Patient's home diabetes regimen is Lantus 20 units QHS, Novolog 10 units twice daily, metformin 1000 mg twice daily, and recently started exenatide 2 mg once weekly.  Fasting CBG 113 this morning. Given clear liquid diet and NPO at midnight, will decrease Lantus dose and hold mealtime coverage until after EGD/colonoscopy.  - Lantus 20 units QHS --> 10 u QHS - Hold Novolog 4 u WC - SSI moderate  Hypokalemia K 3.8 this morning after repletion. Mg stable at 1.9. Will continue to monitor electrolytes.   Diet: CLD today, NPO at midnight for EGD/colonoscopy; per RD, recommend regular diet once eating IVF: None VTE: Heparin Code: Full   Please contact the on call pager after 5 pm and on weekends at [phone].  Alexandria Lodge, MD PGY-1 Internal Medicine Teaching Service Pager: [phone] 08/12/2020

## 2020-08-12 NOTE — Interval H&P Note (Signed)
History and Physical Interval Note:  08/12/2020 8:58 AM  Dana Hughes  has presented today for surgery, with the diagnosis of anemia, recent black stool, nausea, upper abdominal pain , weight loss.  The various methods of treatment have been discussed with the patient and family. After consideration of risks, benefits and other options for treatment, the patient has consented to  Procedure(s): COLONOSCOPY WITH PROPOFOL (N/A) ESOPHAGOGASTRODUODENOSCOPY (EGD) WITH PROPOFOL (N/A) as a surgical intervention.  The patient's history has been reviewed, patient examined, no change in status, stable for surgery.  I have reviewed the patient's chart and labs.  Questions were answered to the patient's satisfaction.     Dana Hughes

## 2020-08-12 NOTE — Telephone Encounter (Signed)
Pt scheduled to see Tye Savoy NP 09/08/20 at 10am.

## 2020-08-12 NOTE — Transfer of Care (Signed)
Immediate Anesthesia Transfer of Care Note  Patient: Dana Hughes  Procedure(s) Performed: COLONOSCOPY WITH PROPOFOL (N/A ) ESOPHAGOGASTRODUODENOSCOPY (EGD) WITH PROPOFOL (N/A ) POLYPECTOMY BIOPSY  Patient Location: Endoscopy Unit  Anesthesia Type:MAC  Level of Consciousness: awake and drowsy  Airway & Oxygen Therapy: Patient Spontanous Breathing and Patient connected to nasal cannula oxygen  Post-op Assessment: Report given to RN and Post -op Vital signs reviewed and stable  Post vital signs: Reviewed and stable  Last Vitals:  Vitals Value Taken Time  BP    Temp    Pulse 102 08/12/20 0945  Resp 18 08/12/20 0945  SpO2 100 % 08/12/20 0945  Vitals shown include unvalidated device data.  Last Pain:  Vitals:   08/12/20 0804  TempSrc: Oral  PainSc: 4          Complications: No complications documented.

## 2020-08-12 NOTE — Telephone Encounter (Signed)
-----   Message from Mauri Pole, MD sent at 08/12/2020 10:10 AM EDT ----- Patient need follow up with Dr Tarri Glenn, next available. Colon prep was inadequate while inpatient.  Thanks

## 2020-08-12 NOTE — Discharge Instructions (Addendum)
Dana Hughes,  It was a pleasure taking care of you in the hospital. You were admitted for both pulmonary embolism (blood clot in the lungs) and pneumonia. You were also taken for a colonoscopy and upper endoscopy by the GI specialists because of your low blood counts.   - For your pulmonary embolism, you have been started on an oral anticoagulant (blood thinner) called apixaban (Eliquis). You will take this for at least 6 months. It is VERY important that you take this medication twice daily EVERY day. You will receive one dose of Eliquis here in the hospital before you leave, pick up your prescriptions and take one 10 mg dose tonight, then continue taking the 10 mg twice daily until starting 5 mg twice daily on 08/14/20.   - I am discharging you with a short course of oxycodone to help with the pain, but the pain should get better overtime. - In order to minimize your risk for developing another clot, we recommend you not take Megace and also consider using a different birth control method. Depo-Provera can increase your risk for clots as well. Until you are using a regular birth control method, if you want to prevent pregnancy and sexually transmitted infection, you will need to use barrier protection (condoms). Recommend you follow-up with your gynecologist to discuss these medications. - For your pneumonia, you completed your 5-day antibiotic course here in the hospital.   - For your anemia (low blood counts) and history of dark stools, you should continue taking your iron supplement. The GI specialist took a biopsy of some changes in your intestines and will communicate these results to you. You are scheduled to follow-up with GI on 09/08/20 at 10 am. - Since your blood pressure has been normal in the hospital without hypertension medication, please do not take until you follow-up in clinic.  Someone from the [facility] will be calling you to schedule a hospital follow-up  appointment.  Take care!  Outpatient Diabetes Education  You have been referred to [facility]'s Nutrition and Diabetes Education Services for outpatient diabetes education. A referral has been sent and the office will contact you for an appointment. For additional questions or to schedule an appointment, call [phone].   You may also contact the inpatient RD that visited you at [phone] or [email or web address] Remember: -Do not skip meals; eat every 3-4 hours -Have balanced meals with similar serving sizes to what you got at the hospital. 1/2 the plate is non-starchy veggies, 1/4 lean protein, 1/4 starch -ALWAYS have a healthy protein/fat when eating a carbohydrate -Limit/avoid non-diet sodas, sweet tea, alcohol, juice, and other sugary beverages  Check out the following websites for more info and recipes: diabetes.org and https://www.patel.info/ (look for the diabetes diet center/diabetes friendly recipes)  Carbohydrate Counting For People With Diabetes  Foods with carbohydrates make your blood glucose level go up. Learning how to count carbohydrates can help you control your blood glucose levels. First, identify the foods you eat that contain carbohydrates. Then, using the Foods with Carbohydrates chart, determine about how much carbohydrates are in your meals and snacks. Make sure you are eating foods with fiber, protein, and healthy fat along with your carbohydrate foods. Foods with Carbohydrates The following table shows carbohydrate foods that have about 15 grams of carbohydrate each. Using measuring cups, spoons, or a food scale when you first begin learning about carbohydrate counting can help you learn about the portion sizes you typically eat. The following foods have 15 grams  carbohydrate each:  Grains . 1 slice bread (1 ounce)  . 1 small tortilla (6-inch size)  .  large bagel (1 ounce)  . 1/3 cup pasta or rice (cooked)  .  hamburger or hot dog bun ( ounce)  .   cup cooked cereal  .  to  cup ready-to-eat cereal  . 2 taco shells (5-inch size) Fruit . 1 small fresh fruit ( to 1 cup)  .  medium banana  . 17 small grapes (3 ounces)  . 1 cup melon or berries  .  cup canned or frozen fruit  . 2 tablespoons dried fruit (blueberries, cherries, cranberries, raisins)  .  cup unsweetened fruit juice  Starchy Vegetables .  cup cooked beans, peas, corn, potatoes/sweet potatoes  .  large baked potato (3 ounces)  . 1 cup acorn or butternut squash  Snack Foods . 3 to 6 crackers  . 8 potato chips or 13 tortilla chips ( ounce to 1 ounce)  . 3 cups popped popcorn  Dairy . 3/4 cup (6 ounces) nonfat plain yogurt, or yogurt with sugar-free sweetener  . 1 cup milk  . 1 cup plain rice, soy, coconut or flavored almond milk Sweets and Desserts .  cup ice cream or frozen yogurt  . 1 tablespoon jam, jelly, pancake syrup, table sugar, or honey  . 2 tablespoons light pancake syrup  . 1 inch square of frosted cake or 2 inch square of unfrosted cake  . 2 small cookies (2/3 ounce each) or  large cookie  Sometimes you'll have to estimate carbohydrate amounts if you don't know the exact recipe. One cup of mixed foods like soups can have 1 to 2 carbohydrate servings, while some casseroles might have 2 or more servings of carbohydrate. Foods that have less than 20 calories in each serving can be counted as "free" foods. Count 1 cup raw vegetables, or  cup cooked non-starchy vegetables as "free" foods. If you eat 3 or more servings at one meal, then count them as 1 carbohydrate serving.  Foods without Carbohydrates  Not all foods contain carbohydrates. Meat, some dairy, fats, non-starchy vegetables, and many beverages don't contain carbohydrate. So when you count carbohydrates, you can generally exclude chicken, pork, beef, fish, seafood, eggs, tofu, cheese, butter, sour cream, avocado, nuts, seeds, olives, mayonnaise, water, black coffee, unsweetened tea, and  zero-calorie drinks. Vegetables with no or low carbohydrate include green beans, cauliflower, tomatoes, and onions. How much carbohydrate should I eat at each meal?  Carbohydrate counting can help you plan your meals and manage your weight. Following are some starting points for carbohydrate intake at each meal. Work with your registered dietitian nutritionist to find the best range that works for your blood glucose and weight.   To Lose Weight To Maintain Weight  Women 2 - 3 carb servings 3 - 4 carb servings  Men 3 - 4 carb servings 4 - 5 carb servings  Checking your blood glucose after meals will help you know if you need to adjust the timing, type, or number of carbohydrate servings in your meal plan. Achieve and keep a healthy body weight by balancing your food intake and physical activity.  Tips How should I plan my meals?  Plan for half the food on your plate to include non-starchy vegetables, like salad greens, broccoli, or carrots. Try to eat 3 to 5 servings of non-starchy vegetables every day. Have a protein food at each meal. Protein foods include chicken, fish, meat, eggs, or  beans (note that beans contain carbohydrate). These two food groups (non-starchy vegetables and proteins) are low in carbohydrate. If you fill up your plate with these foods, you will eat less carbohydrate but still fill up your stomach. Try to limit your carbohydrate portion to  of the plate.  What fats are healthiest to eat?  Diabetes increases risk for heart disease. To help protect your heart, eat more healthy fats, such as olive oil, nuts, and avocado. Eat less saturated fats like butter, cream, and high-fat meats, like bacon and sausage. Avoid trans fats, which are in all foods that list "partially hydrogenated oil" as an ingredient. What should I drink?  Choose drinks that are not sweetened with sugar. The healthiest choices are water, carbonated or seltzer waters, and tea and coffee without added sugars.   Sweet drinks will make your blood glucose go up very quickly. One serving of soda or energy drink is  cup. It is best to drink these beverages only if your blood glucose is low.  Artificially sweetened, or diet drinks, typically do not increase your blood glucose if they have zero calories in them. Read labels of beverages, as some diet drinks do have carbohydrate and will raise your blood glucose. Label Reading Tips Read Nutrition Facts labels to find out how many grams of carbohydrate are in a food you want to eat. Don't forget: sometimes serving sizes on the label aren't the same as how much food you are going to eat, so you may need to calculate how much carbohydrate is in the food you are serving yourself.   Carbohydrate Counting for People with Diabetes Sample 1-Day Menu  Breakfast  cup yogurt, low fat, low sugar (1 carbohydrate serving)   cup cereal, ready-to-eat, unsweetened (1 carbohydrate serving)  1 cup strawberries (1 carbohydrate serving)   cup almonds ( carbohydrate serving)  Lunch 1, 5 ounce can chunk light tuna  2 ounces cheese, low fat cheddar  6 whole wheat crackers (1 carbohydrate serving)  1 small apple (1 carbohydrate servings)   cup carrots ( carbohydrate serving)   cup snap peas  1 cup 1% milk (1 carbohydrate serving)   Evening Meal Stir fry made with: 3 ounces chicken  1 cup brown rice (3 carbohydrate servings)   cup broccoli ( carbohydrate serving)   cup green beans   cup onions  1 tablespoon olive oil  2 tablespoons teriyaki sauce ( carbohydrate serving)  Evening Snack 1 extra small banana (1 carbohydrate serving)  1 tablespoon peanut butter   Carbohydrate Counting for People with Diabetes Vegan Sample 1-Day Menu  Breakfast 1 cup cooked oatmeal (2 carbohydrate servings)   cup blueberries (1 carbohydrate serving)  2 tablespoons flaxseeds  1 cup soymilk fortified with calcium and vitamin D  1 cup coffee  Lunch 2 slices whole wheat bread (2  carbohydrate servings)   cup baked tofu   cup lettuce  2 slices tomato  2 slices avocado   cup baby carrots ( carbohydrate serving)  1 orange (1 carbohydrate serving)  1 cup soymilk fortified with calcium and vitamin D   Evening Meal Burrito made with: 1 6-inch corn tortilla (1 carbohydrate serving)  1 cup refried vegetarian beans (2 carbohydrate servings)   cup chopped tomatoes   cup lettuce   cup salsa  1/3 cup brown rice (1 carbohydrate serving)  1 tablespoon olive oil for rice   cup zucchini   Evening Snack 6 small whole grain crackers (1 carbohydrate serving)  2 apricots (  carbohydrate serving)   cup unsalted peanuts ( carbohydrate serving)    Carbohydrate Counting for People with Diabetes Vegetarian (Lacto-Ovo) Sample 1-Day Menu  Breakfast 1 cup cooked oatmeal (2 carbohydrate servings)   cup blueberries (1 carbohydrate serving)  2 tablespoons flaxseeds  1 egg  1 cup 1% milk (1 carbohydrate serving)  1 cup coffee  Lunch 2 slices whole wheat bread (2 carbohydrate servings)  2 ounces low-fat cheese   cup lettuce  2 slices tomato  2 slices avocado   cup baby carrots ( carbohydrate serving)  1 orange (1 carbohydrate serving)  1 cup unsweetened tea  Evening Meal Burrito made with: 1 6-inch corn tortilla (1 carbohydrate serving)   cup refried vegetarian beans (1 carbohydrate serving)   cup tomatoes   cup lettuce   cup salsa  1/3 cup brown rice (1 carbohydrate serving)  1 tablespoon olive oil for rice   cup zucchini  1 cup 1% milk (1 carbohydrate serving)  Evening Snack 6 small whole grain crackers (1 carbohydrate serving)  2 apricots ( carbohydrate serving)   cup unsalted peanuts ( carbohydrate serving)    Copyright 2020  Academy of Nutrition and Dietetics. All rights reserved.  Using Nutrition Labels: Carbohydrate  . Serving Size  . Look at the serving size. All the information on the label is based on this portion. Randol Kern Per  Container  . The number of servings contained in the package. . Guidelines for Carbohydrate  . Look at the total grams of carbohydrate in the serving size.  . 1 carbohydrate choice = 15 grams of carbohydrate. Range of Carbohydrate Grams Per Choice  Carbohydrate Grams/Choice Carbohydrate Choices  6-10   11-20 1  21-25 1  26-35 2  36-40 2  41-50 3  51-55 3  56-65 4  66-70 4  71-80 5    Copyright 2020  Academy of Nutrition and Dietetics. All rights reserved.    Glucose Products:  ReliOnT glucose products raise low blood sugar fast. Tablets are free of fat, caffeine, sodium and gluten. They are portable and easy to carry, making it easier for people with diabetes to BE PREPARED for lows.  Glucose Tablets Available in 6 flavors . 10 ct...................................... $1.00 . 50 ct...................................... $3.98 Glucose Shot..................................$1.48 Glucose Gel....................................$3.44  Alcohol Swabs Alcohol swabs are used to sterilize your injection site. All of our swabs are individually wrapped for maximum safety, convenience and moisture retention. ReliOnT Alcohol Swabs . 100 ct Swabs..............................$1.00 . 400 ct Swabs..............................$3.74  Lancets ReliOnT offers three lancet options conveniently designed to work with almost every lancing device. Each features a protective disk, which guarantees sterility before testing. ReliOnT Lancets . 100 ct Lancets $1.56 . 200 ct Lancets $2.64 Available in Ultra-Thin, Thin & Micro-Thin ReliOnT 2-IN-1 Lancing Device . 50 ct Lancets..................................... $3.44 Available in 30 gauge and 25 gauge ReliOnT Lancing Device....................$5.84  Blood Glucose Monitors ReliOnT offers a full range of blood glucose testing options to provide an accurate, affordable system that meets each person's unique needs and preferences. Prime  Meter....................................... $9.00 Prime Test Strips . 25 test strips.................................... $5.00 . 50 test strips.................................... $9.00 . 100 test strips.................................$17.88 Premier BLU Meter  ............  $18.98 Premier Voice Meter  .............  $14.98 Premier Test Strips . 50 test strips.................................... $9.00 . 100 test strips.................................$17.88 Premier [employer]  ............  $19.44 Kit includes: . 50 test strips . 10 lancets . Lancing device . Carry case  Ketone Test Strips . 50 test strips  ................  $6.64  Human Insulin  Novolin/ReliOnT (recombinant DNA origin) is manufactured for [employer] by [employer] Insulin* with Vial..........$24.88 Available in N, R, 70/30 Novolin/ReliOnT Insulin Pens*  .....  $42.88 Available in N, R, 70/30  Insulin Delivery ReliOnT syringes and pen needles provide precision technology, comfort and accuracy in insulin delivery at affordable prices. ReliOnT Pen Needles* . 50 ct....................................................$9.00 Available in 64m, 642m 78m62m 71m62mliOnT Insulin Syringes* . 100 ct ............ $12.58 Available in 29G, 30G & 31G (3/10cc, 1/2cc & 1cc units)

## 2020-08-12 NOTE — Discharge Summary (Signed)
Name: Dana Hughes MRN: [id number] DOB: 03-21-1981 40 y.o. PCP: Dana Dame, MD  Date of Admission: 08/08/2020 12:15 AM Date of Discharge: 08/12/2020 Attending Physician: Dana Falcon, MD  Discharge Diagnosis: 1. Submassive pulmonary embolism 2. Community acquired pneumonia 3. Symptomatic iron deficiency anemia 4. Melena 5. Family history of colon cancer 6. Sessile colon polyps seen on colonoscopy 7. Nodular intestinal mucosa 8. S/p EGD and colonoscopy 9. Yeast infection 10. Hypertension 11. Type 2 diabetes mellitus 12. Hypokalemia 13. Thrombocytosis 14. Mild LVH 15. Mildly reduced ejection fraction 16. Unintentional weight loss 17. Non-bleeding external and internal hemmrhoids  Discharge Medications: Allergies as of 08/12/2020      Reactions   Mushroom Extract Complex Anaphylaxis   Latex Itching, Swelling   Penicillins Itching   Has patient had a PCN reaction causing immediate rash, facial/tongue/throat swelling, SOB or lightheadedness with hypotension: No Has patient had a PCN reaction causing severe rash involving mucus membranes or skin necrosis: No Has patient had a PCN reaction that required hospitalization No Has patient had a PCN reaction occurring within the last 10 years: No If all of the above answers are "NO", then may proceed with Cephalosporin use.      Medication List    STOP taking these medications   amLODipine 5 MG tablet Commonly known as: Norvasc   benzonatate 100 MG capsule Commonly known as: TESSALON   DAYQUIL PO   diclofenac Sodium 1 % Gel Commonly known as: Voltaren   megestrol 40 MG tablet Commonly known as: MEGACE   NYQUIL PO   omeprazole 20 MG capsule Commonly known as: PriLOSEC     TAKE these medications   acetaminophen 500 MG tablet Commonly known as: TYLENOL Take 2 tablets (1,000 mg total) by mouth every 6 (six) hours as needed for mild pain (or Fever >/= 101).   albuterol 108 (90 Base) MCG/ACT  inhaler Commonly known as: VENTOLIN HFA Inhale 2 puffs into the lungs every 6 (six) hours as needed for wheezing or shortness of breath. Reported on 09/29/2015   albuterol (2.5 MG/3ML) 0.083% nebulizer solution Commonly known as: PROVENTIL Take 3 mLs (2.5 mg total) by nebulization every 6 (six) hours as needed for wheezing or shortness of breath.   AMBULATORY NON FORMULARY MEDICATION Nitroglycerin 0.125% gel apply a small amount to the rectum 3 times daily x 6-8 weeks.   AMBULATORY NON FORMULARY MEDICATION Medication Name: nitroglycerin 0.125 % gel applied to the rectum TID FOR 6 WEEKS 6-8 WEEKS ( NOT TO BE USED WITH CONCURRENT PHOSPHODIESTERASE INHIBITORS).   apixaban 5 MG Tabs tablet Commonly known asArne [place] On 4/22 -4/23:  Take 2 tablets by mouth twice daily Starting on 4/24:  Decrease to 1 tablet by mouth twice daily Get Refills at [place]   ARIPiprazole 5 MG tablet Commonly known as: ABILIFY Take 1 tablet (5 mg total) by mouth daily.   artificial tears Oint ophthalmic ointment Commonly known as: LACRILUBE Place into both eyes at bedtime as needed for dry eyes.   busPIRone 10 MG tablet Commonly known as: BUSPAR Take 1 tablet (10 mg total) by mouth 3 (three) times daily.   Bydureon BCise 2 MG/0.85ML Auij Generic drug: Exenatide ER Inject 2 mg into the skin once a week.   dicyclomine 10 MG capsule Commonly known as: BENTYL Take 1 capsule (10 mg total) by mouth 4 (four) times daily as needed for spasms (abdominal pain).   fluticasone 50 MCG/ACT nasal spray Commonly known as: Holiday representative  1 spray into both nostrils in the morning and at bedtime. What changed:   when to take this  reasons to take this   glucose blood test strip Check your sugar in the morning before you eat breakfast, and one hour after a meal.   glucose monitoring kit monitoring kit 1 each by Does not apply route daily. Check glucose once in the morning before  breakfast and 1 hour after a meal   guaifenesin 100 MG/5ML syrup Commonly known as: ROBITUSSIN Take 200 mg by mouth 3 (three) times daily as needed for cough.   hydrOXYzine 10 MG tablet Commonly known as: ATARAX/VISTARIL Take 1 tablet (10 mg total) by mouth 3 (three) times daily as needed. What changed: reasons to take this   insulin aspart 100 UNIT/ML FlexPen Commonly known as: NOVOLOG Inject 10 Units into the skin 2 (two) times daily with a meal.   Lantus SoloStar 100 UNIT/ML Solostar Pen Generic drug: insulin glargine Inject 20 Units into the skin daily at 10 pm. What changed: when to take this   meclizine 25 MG tablet Commonly known as: ANTIVERT Take 1 tablet (25 mg total) by mouth 3 (three) times daily as needed for dizziness.   metFORMIN 1000 MG tablet Commonly known as: Glucophage Take 1 tablet (1,000 mg total) by mouth 2 (two) times daily with a meal.   mirtazapine 30 MG tablet Commonly known as: REMERON Take 1 tablet (30 mg total) by mouth at bedtime.   oxyCODONE 5 MG immediate release tablet Commonly known as: Oxy IR/ROXICODONE Take 1 tablet (5 mg total) by mouth every 6 (six) hours as needed for up to 5 days for breakthrough pain.   pantoprazole 40 MG tablet Commonly known as: PROTONIX Take 1 tablet (40 mg total) by mouth daily.   Polysaccharide Iron Complex 150 MG Tabs Take 150 mg by mouth daily.   Unifine Pen Needles 32G X 4 MM Misc Generic drug: Insulin Pen Needle 1 application by Does not apply route in the morning, at noon, and at bedtime.       Disposition and follow-up:   [name] was discharged from [facility] in Stable condition.  At the hospital follow up visit please address:  Submassive pulmonary embolism. Possible provoking factor considered to be megace vs malignancy (see below).  Discharge medication: Eliquis for minimum of 6 months Follow up recommendations - Ensure she is taking Eliquis as prescribed.  -  Ensure she has referral to clinical pharmacist for medication assistance. She fills her medications at [facility] and has been on medication assistance with them previously.  Symptomatic iron deficiency anemia. Suspected to be secondary to GI bleed in the setting of melena. Discharge hemoglobin 9.2 Unintentional weight loss, melena, family history of colon cancer Erosive gastropathy without bleeding Sessile colon polyps seen on colonoscopy Nodular intestinal mucosa Follow up recommendations - CBC  -follow up pathology results - ensure follow up with Dr. Modena Nunnery with GI. Will need repeat colonoscopy due to suboptimal prep. She is scheduled to see Tye Savoy NP 09/08/20 at 10am.  - avoid NSAIDS - continue protonix 78m daily  Hypertension Medication change: amlodipine held at discharge Follow up recommendation - recheck at time of follow up and adjust accordingly  Endometriosis Medication change: megace discontinued at discharge.  Follow up recommendation - Ensure follow-up with gynecology  Birth control Medication change: Advised to consider alternative birth control than Depo-Provera given increased clot risk. Follow up recommendation - Discuss alternative BC  methods and ensure patient is using barrier protection if she does not desire pregnancy  Labs / imaging needed at time of follow-up: CBC, BMP  Pending labs/ test needing follow-up: pathology results  Follow-up Appointments:  Follow-up Information    Dana Dame, MD. Schedule an appointment as soon as possible for a visit in 1 week(s).   Specialty: Internal Medicine Contact information: [place] [id number] [phone]               Hospital Course:  NOE PITTSLEY is a 40 year old female with asthma, hypertension, endometriosis, and diabetes who presented to [facility] ED on 08/08/20 for acute onset chest pain and shortness of breath.  Workup in the ED revealed a high  risk pulmonary embolism with evidence of right heart strain on CT (1.2 RV/LV ratio) and echocardiogram. Interventional radiology was consulted and recommended no invasive interventions. Lower extremity DVT studies were unremarkable. She was placed on IV heparin. She remained hemodynamically stable throughout the course of her hospitalization. She was discharged on Eliquis. Due to the period of time of being on therapeutic on heparin, she was able to transition to daily dosing only a couple of days after discharge.  It still remains unclear as to whether this was provoked or not. The primary provoking risk factor she had was being on megace (for endometriosis). This was held over her admission and discontinued at discharge. Furthermore, the weight loss, melena, iron deficiency anemia, family history and colonoscopy findings are concerning for a malignant source although no mass was appreciated on endoscopy. Depo-Provera could have been contributing as well.  She was also noted to have a leukocytosis and imaging findings that raised concern for pneumonia on admission. She received a 5 day course of rocephin and azithromycin and completed the course by the day of discharge.   She was also noted to have a hemoglobin drop which prompted review of her history which was significant for melena and epigastric abdominal pain. Her hemoglobin stabilized. She had been previously referred to GI who attempted colonoscopy on 06/22/20 which was incomplete due to incomplete bowel prep. Since she required anticoagulation for PE, and knowing it would be logistically challenging to pursue endoscopy/colonoscopy outpatient, GI was consulted during this admission and took patient for EGD/colonoscopy on 08/12/20. EGD showed erosive gastropathy with no bleeding and no stigmata of recent bleeding. Colonoscopy showed inadequate colon prep, two 1 to 2 mm polyps in the cecum which were removed with a cold biopsy forceps, nodular mucosa at the  hepatic flexure and in the ascending colon which was biopsied, and non-bleeding external and internal hemorrhoids. She was scheduled for close GI follow-up and instructed to continue Protonix 40 mg daily and avoid ibuprofen, naproxen, or other non-steroidal anti-inflammatory drugs. Hemoglobin 9.2 on day of discharge.  ~~~~~~~~~~~~~~~~~~~~~~~~~~~~~~~~~~~~~~~~~~~~~~~~~~~~~~~~~~~~~~~~~~~~~~~~~~~~~~~~~~~~~~~  Day of Discharge Subjective:  No acute events overnight. Patient seen at bedside after returning from endoscopy suite. She states she tolerated the procedure. Endorses persistent pleuritic chest pain. Discussed results of the endoscopy/colonoscopy, and she expressed understanding. Feels ready to go home.  Discharge Exam:   BP 103/64   Pulse 89   Temp 98.5 F (36.9 C) (Oral)   Resp 20   Ht _0  (1.6 m)   Wt 82 kg   SpO2 98%   BMI 32.02 kg/m  Constitutional:well-appearing woman sitting on edge of bed, in no acute distress Cardiovascular:regular rate andrhythm, no m/r/g; no lower extremity edema Pulmonary/Chest: normal work of breathing on room  air, periodic dry cough Abdominal: soft, non-tender, non-distended Skin: warm and dry  Pertinent Labs, Studies, and Procedures:   08/12/20 Colonoscopy Impression - Preparation of the colon was inadequate. - Stool in the entire examined colon. - No obvious mass lesion, obstruction or active lower GI bleeding - Two 1 to 2 mm polyps in the cecum, removed with a cold biopsy forceps. Resected and retrieved. - Nodular mucosa at the hepatic flexure and in the ascending colon. Biopsied. - Non-bleeding external and internal hemorrhoids.  08/12/20 Upper endoscopy Impression - Z-line regular, 38 cm from the incisors. - No gross lesions in esophagus. - Erosive gastropathy with no bleeding and no stigmata of recent bleeding. - Normal examined duodenum.  08/08/20 DG Chest 2 View CLINICAL DATA:  Cough EXAM: CHEST - 2 VIEW COMPARISON:  07/03/2020  FINDINGS: The heart size and mediastinal contours are within normal limits. Both lungs are clear. The visualized skeletal structures are unremarkable. IMPRESSION: Normal study. Electronically Signed   By: Rolm Baptise M.D.   On: 08/08/2020 01:59   08/08/20 CT Angio Chest PE W/Cm &/Or Wo Cm CLINICAL DATA:  Months of cough, now with developing chest pain, positive D-dimer EXAM: CT ANGIOGRAPHY CHEST WITH CONTRAST TECHNIQUE: Multidetector CT imaging of the chest was performed using the standard protocol during bolus administration of intravenous contrast. Multiplanar CT image reconstructions and MIPs were obtained to evaluate the vascular anatomy. CONTRAST:  48mL OMNIPAQUE IOHEXOL 350 MG/ML SOLN COMPARISON:  Radiograph 08/08/2020 FINDINGS: Cardiovascular: Satisfactory opacification of pulmonary arteries. Bilateral hypodense pulmonary artery emboli are seen extending throughout the left upper and lower lobar arteries and throughout the segmental and subsegmental branches of the upper, lower lobe and lingula. Filling defect in the right lung extends from the distal right main and interlobar pulmonary arteries throughout the lobar, segmental and subsegmental branches of the right upper, middle and lower lobe. There is central pulmonary artery enlargement as well as flattening of the intraventricular septum and elevated RV/LV ratio of approximately 1.2. Cardiac size remains within top normal limits. No pericardial effusion. The aortic root is suboptimally assessed given cardiac pulsation artifact. The aorta is normal caliber. No acute luminal abnormality of the imaged aorta. No periaortic stranding or hemorrhage. Normal 3 vessel branching of the aortic arch. Proximal great vessels are unremarkable. No major venous abnormalities. Mediastinum/Nodes: No mediastinal fluid or gas. Normal thyroid gland and thoracic inlet. No acute abnormality of the trachea or esophagus. No worrisome mediastinal, hilar or axillary adenopathy.  Lungs/Pleura: Patchy multifocal areas of ground-glass opacity are present predominantly throughout the right lobe though minimally in the left upper lobe as well. Largely regions demonstrate some subpleural sparing though more peripheral opacity is seen elsewhere particular in the upper and lower lobes. Minimal septal thickening is present as well. No pneumothorax. No layering effusion. No concerning pulmonary nodules or masses. Upper Abdomen: No acute abnormalities present in the visualized portions of the upper abdomen. Musculoskeletal: Multilevel degenerative changes are present in the imaged portions of the spine. No acute osseous abnormality or suspicious osseous lesion. No worrisome chest wall mass or lesion. Review of the MIP images confirms the above findings. IMPRESSION: 1. Extensive bilateral pulmonary artery emboli involving the distal right main and interlobar pulmonary arteries as well as throughout the lobar, segmental and subsegmental branches of both the right and left lung. Positive for acute PE with CT evidence of right heart strain (RV/LV Ratio 1.2) consistent with at least submassive (intermediate risk) PE. The presence of right heart strain has been associated  with an increased risk of morbidity and mortality. 2. Patchy multifocal areas of ground-glass opacity predominantly throughout the right lobe though minimally in the left upper lobe as well. Findings are favored to reflect an infectious or inflammatory process with developing pulmonary infarct less favored given sub pleural regions of spared parenchyma though not fully excluded particularly in the more peripheral areas of opacity. Critical Value/emergent results were called by telephone at the time of interpretation on 08/08/2020 at 5:24 am to provider [facility] , who verbally acknowledged these results. Electronically Signed   By: Lovena Le M.D.   On: 08/08/2020 05:25   08/08/20 ECHOCARDIOGRAM COMPLETE    ECHOCARDIOGRAM REPORT    Patient Name:   MARVINE ENCALADE Date of Exam: 08/08/2020 Medical Rec #:  [id number]        Height:       63.0 in Accession #:    [id number]       Weight:       180.8 lb Date of Birth:  13-Jun-1980         BSA:          1.852 m Patient Age:    11 years         BP:           93/65 mmHg Patient Gender: F                HR:           89 bpm. Exam Location:  Inpatient Procedure: 2D Echo Indications:    pulmonary embolus  History:        Patient has no prior history of Echocardiogram examinations.                 Risk Factors:Diabetes and Hypertension.  Sonographer:    Johny Chess Referring Phys: [place]  1. Left ventricular ejection fraction, by estimation, is 45 to 50%. The left ventricle has mildly decreased function. The left ventricle demonstrates global hypokinesis. There is mild asymmetric left ventricular hypertrophy of the basal-septal segment. The interventricular septum is flattened in systole, consistent with right ventricular pressure overload.  2. Right ventricular systolic function is mildly reduced. The right ventricular size is mild-to-moderately enlarged. Tricuspid regurgitation signal is inadequate for assessing PA pressure.  3. The mitral valve is normal in structure. Trivial mitral valve regurgitation. No evidence of mitral stenosis.  4. The aortic valve is tricuspid. Aortic valve regurgitation is not visualized. No aortic stenosis is present.  5. The inferior vena cava is dilated in size with >50% respiratory variability, suggesting right atrial pressure of 8 mmHg.  6. Findings consistent with RV strain in the setting of known pulmonary embolism. Comparison(s): No prior Echocardiogram. FINDINGS  Left Ventricle: Left ventricular ejection fraction, by estimation, is 45 to 50%. The left ventricle has mildly decreased function. The left ventricle demonstrates global hypokinesis. The left ventricular internal cavity size was normal in size. There is  mild asymmetric left  ventricular hypertrophy of the basal-septal segment. The interventricular septum is flattened in systole, consistent with right ventricular pressure overload. Left ventricular diastolic parameters were normal. Right Ventricle: The right ventricular size is mild-to-moderately enlarged. No increase in right ventricular wall thickness. Right ventricular systolic function is mildly reduced. Tricuspid regurgitation signal is inadequate for assessing PA pressure. Left Atrium: Left atrial size was normal in size. Right Atrium: Right atrial size was normal in size. Pericardium: There is no evidence of pericardial effusion. Mitral Valve: The mitral valve  is normal in structure. There is mild thickening of the mitral valve leaflet(s). Trivial mitral valve regurgitation. No evidence of mitral valve stenosis. Tricuspid Valve: The tricuspid valve is normal in structure. Tricuspid valve regurgitation is trivial. Aortic Valve: The aortic valve is tricuspid. Aortic valve regurgitation is not visualized. No aortic stenosis is present. Pulmonic Valve: The pulmonic valve was normal in structure. Pulmonic valve regurgitation is not visualized. Aorta: The aortic root and ascending aorta are structurally normal, with no evidence of dilitation. Venous: The inferior vena cava is dilated in size with greater than 50% respiratory variability, suggesting right atrial pressure of 8 mmHg. IAS/Shunts: No atrial level shunt detected by color flow Doppler.  LEFT VENTRICLE PLAX 2D LVIDd:         4.40 cm     Diastology LVIDs:         3.30 cm     LV e' medial:    9.25 cm/s LV PW:         0.70 cm     LV E/e' medial:  6.1 LV IVS:        0.80 cm     LV e' lateral:   12.00 cm/s LVOT diam:     2.30 cm     LV E/e' lateral: 4.7 LV SV:         49 LV SV Index:   26 LVOT Area:     4.15 cm  LV Volumes (MOD) LV vol d, MOD A4C: 73.2 ml LV vol s, MOD A4C: 36.8 ml LV SV MOD A4C:     73.2 ml RIGHT VENTRICLE            IVC RV S prime:     8.49 cm/s  IVC diam: 2.00  cm TAPSE (M-mode): 1.9 cm LEFT ATRIUM             Index       RIGHT ATRIUM          Index LA diam:        3.00 cm 1.62 cm/m  RA Area:     9.94 cm LA Vol (A2C):   37.0 ml 19.97 ml/m RA Volume:   19.50 ml 10.53 ml/m LA Vol (A4C):   33.5 ml 18.09 ml/m LA Biplane Vol: 36.3 ml 19.60 ml/m  AORTIC VALVE LVOT Vmax:   68.50 cm/s LVOT Vmean:  48.400 cm/s LVOT VTI:    0.117 m  AORTA Ao Root diam: 3.40 cm Ao Asc diam:  3.00 cm MITRAL VALVE MV Area (PHT): 3.12 cm    SHUNTS MV Decel Time: 243 msec    Systemic VTI:  0.12 m MV E velocity: 56.60 cm/s  Systemic Diam: 2.30 cm MV A velocity: 58.70 cm/s MV E/A ratio:  0.96 Gwyndolyn Kaufman MD Electronically signed by Gwyndolyn Kaufman MD Signature Date/Time: 08/08/2020/12:42:42 PM    Final    08/08/20 VAS [place] LOWER EXTREMITY VENOUS (DVT)   Lower Venous DVT Study Indications: Pulmonary embolism, and Coughing, shortness of breath.  Comparison Study: No prior study on file Performing Technologist: Sharion Dove RVS  Examination Guidelines: A complete evaluation includes B-mode imaging, spectral Doppler, color Doppler, and power Doppler as needed of all accessible portions of each vessel. Bilateral testing is considered an integral part of a complete examination. Limited examinations for reoccurring indications may be performed as noted. The reflux portion of the exam is performed with the patient in reverse Trendelenburg.  +---------+---------------+---------+-----------+----------+--------------+ RIGHT    CompressibilityPhasicitySpontaneityPropertiesThrombus Aging +---------+---------------+---------+-----------+----------+--------------+ CFV  Full           Yes      Yes                                 +---------+---------------+---------+-----------+----------+--------------+ SFJ      Full                                                        +---------+---------------+---------+-----------+----------+--------------+ FV Prox  Full                                                         +---------+---------------+---------+-----------+----------+--------------+ FV Mid   Full                                                        +---------+---------------+---------+-----------+----------+--------------+ FV DistalFull                                                        +---------+---------------+---------+-----------+----------+--------------+ PFV      Full                                                        +---------+---------------+---------+-----------+----------+--------------+ POP      Full           Yes      Yes                                 +---------+---------------+---------+-----------+----------+--------------+ PTV      Full                                                        +---------+---------------+---------+-----------+----------+--------------+ PERO     Full                                                        +---------+---------------+---------+-----------+----------+--------------+ GSV      Full                                                        +---------+---------------+---------+-----------+----------+--------------+   +---------+---------------+---------+-----------+----------+--------------+  LEFT     CompressibilityPhasicitySpontaneityPropertiesThrombus Aging +---------+---------------+---------+-----------+----------+--------------+ CFV      Full           Yes      Yes                                 +---------+---------------+---------+-----------+----------+--------------+ SFJ      Full                                                        +---------+---------------+---------+-----------+----------+--------------+ FV Prox  Full                                                        +---------+---------------+---------+-----------+----------+--------------+ FV Mid   Full                                                         +---------+---------------+---------+-----------+----------+--------------+ FV DistalFull                                                        +---------+---------------+---------+-----------+----------+--------------+ PFV      Full                                                        +---------+---------------+---------+-----------+----------+--------------+ POP      Full           Yes      Yes                                 +---------+---------------+---------+-----------+----------+--------------+ PTV      Full                                                        +---------+---------------+---------+-----------+----------+--------------+ PERO     Full                                                        +---------+---------------+---------+-----------+----------+--------------+ Gastroc  Full                                                        +---------+---------------+---------+-----------+----------+--------------+  Summary: BILATERAL: - No evidence of deep vein thrombosis seen in the lower extremities, bilaterally. -   *See table(s) above for measurements and observations. Electronically signed by Ruta Hinds MD on 08/08/2020 at 12:53:56 PM.    Final     Discharge Instructions: Discharge Instructions    Amb Referral to Nutrition and Diabetic Education   Complete by: As directed    Call MD for:  difficulty breathing, headache or visual disturbances   Complete by: As directed    Call MD for:  extreme fatigue   Complete by: As directed    Call MD for:  persistant dizziness or light-headedness   Complete by: As directed    Call MD for:  persistant nausea and vomiting   Complete by: As directed    Call MD for:  severe uncontrolled pain   Complete by: As directed    Call MD for:  temperature >100.4   Complete by: As directed      Ms. Allshouse,  It was a pleasure taking care of you in the hospital. You were admitted for both pulmonary  embolism (blood clot in the lungs) and pneumonia. You were also taken for a colonoscopy and upper endoscopy by the GI specialists because of your low blood counts.   - For your pulmonary embolism, you have been started on an oral anticoagulant (blood thinner) called apixaban (Eliquis). You will take this for at least 6 months. It is VERY important that you take this medication twice daily EVERY day. You will receive one dose of Eliquis here in the hospital before you leave, pick up your prescriptions and take one 10 mg dose tonight, then continue taking the 10 mg twice daily until starting 5 mg twice daily on 08/14/20.   - I am discharging you with a short course of oxycodone to help with the pain, but the pain should get better overtime. - In order to minimize your risk for developing another clot, we recommend you not take Megace and also consider using a different birth control method. Depo-Provera can increase your risk for clots as well. Until you are using a regular birth control method, if you want to prevent pregnancy and sexually transmitted infection, you will need to use barrier protection (condoms). Recommend you follow-up with your gynecologist to discuss these medications. - For your pneumonia, you completed your 5-day antibiotic course here in the hospital.   - For your anemia (low blood counts) and history of dark stools, you should continue taking your iron supplement. The GI specialist took a biopsy of some changes in your intestines and will communicate these results to you. You are scheduled to follow-up with GI on 09/08/20 at 10 am. - Since your blood pressure has been normal in the hospital without hypertension medication, please do not take until you follow-up in clinic.  Someone from the [facility] Medicine [facility] will be calling you to schedule a hospital follow-up appointment.  Take care!   Signed:  Alexandria Lodge, MD Internal Medicine Resident, PGY-1 [facility] Pager: [phone] 1:39 PM, 08/12/2020

## 2020-08-12 NOTE — Op Note (Signed)
[facility] Patient Name: Dana Hughes Procedure Date : 08/12/2020 MRN: [id number] Attending MD: Mauri Pole , MD Date of Birth: October 25, 1980 CSN: [id number] Age: 40 Admit Type: Inpatient Procedure:                Upper GI endoscopy Indications:              Suspected upper gastrointestinal bleeding in                            patient with unexplained iron deficiency anemia Providers:                Mauri Pole, MD, Carmie End, RN, Cherylynn Ridges, Technician, Rejeana Brock, CRNA Referring MD:              Medicines:                Monitored Anesthesia Care Complications:            No immediate complications. Estimated Blood Loss:     Estimated blood loss was minimal. Procedure:                Pre-Anesthesia Assessment:                           - Prior to the procedure, a History and Physical                            was performed, and patient medications and                            allergies were reviewed. The patient's tolerance of                            previous anesthesia was also reviewed. The risks                            and benefits of the procedure and the sedation                            options and risks were discussed with the patient.                            All questions were answered, and informed consent                            was obtained. Prior Anticoagulants: The patient has                            taken no previous anticoagulant or antiplatelet                            agents. ASA Grade Assessment: III - A patient with  severe systemic disease. After reviewing the risks                            and benefits, the patient was deemed in                            satisfactory condition to undergo the procedure.                           After obtaining informed consent, the endoscope was                            passed under direct vision. Throughout  the                            procedure, the patient's blood pressure, pulse, and                            oxygen saturations were monitored continuously. The                            [id number]-H190 ([id number]) Olympus gastroscope was                            introduced through the mouth, and advanced to the                            second part of duodenum. The upper GI endoscopy was                            accomplished without difficulty. The patient                            tolerated the procedure well. Scope In: Scope Out: Findings:      The Z-line was regular and was found 38 cm from the incisors.      No gross lesions were noted in the entire esophagus.      A single localized less than 1 mm erosion with no bleeding and no       stigmata of recent bleeding was found in the gastric antrum.      The examined duodenum was normal. Impression:               - Z-line regular, 38 cm from the incisors.                           - No gross lesions in esophagus.                           - Erosive gastropathy with no bleeding and no                            stigmata of recent bleeding.                           -  Normal examined duodenum.                           - No specimens collected. Recommendation:           - Patient has a contact number available for                            emergencies. The signs and symptoms of potential                            delayed complications were discussed with the                            patient. Return to normal activities tomorrow.                            Written discharge instructions were provided to the                            patient.                           - Resume previous diet.                           - Continue present medications.                           - No ibuprofen, naproxen, or other non-steroidal                            anti-inflammatory drugs.                           - Use Protonix (pantoprazole) 40 mg  PO daily.                           - See the other procedure note for documentation of                            additional recommendations. Procedure Code(s):        --- Professional ---                           [phone], Esophagogastroduodenoscopy, flexible,                            transoral; diagnostic, including collection of                            specimen(s) by brushing or washing, when performed                            (separate procedure) Diagnosis Code(s):        --- Professional ---  K31.89, Other diseases of stomach and duodenum                           D50.9, Iron deficiency anemia, unspecified CPT copyright 2019 American [employer]. All rights reserved. The codes documented in this report are preliminary and upon coder review may  be revised to meet current compliance requirements. Mauri Pole, MD 08/12/2020 9:48:17 AM This report has been signed electronically. Number of Addenda: 0

## 2020-08-12 NOTE — Op Note (Addendum)
[facility] Patient Name: Dana Hughes Procedure Date : 08/12/2020 MRN: [id number] Attending MD: Mauri Pole , MD Date of Birth: August 03, 1980 CSN: [id number] Age: 40 Admit Type: Inpatient Procedure:                Colonoscopy Indications:              Unexplained iron deficiency anemia Providers:                Mauri Pole, MD, Carmie End, RN, Cherylynn Ridges, Technician Referring MD:              Medicines:                Monitored Anesthesia Care Complications:            No immediate complications. Estimated Blood Loss:     Estimated blood loss was minimal. Procedure:                Pre-Anesthesia Assessment:                           - Prior to the procedure, a History and Physical                            was performed, and patient medications and                            allergies were reviewed. The patient's tolerance of                            previous anesthesia was also reviewed. The risks                            and benefits of the procedure and the sedation                            options and risks were discussed with the patient.                            All questions were answered, and informed consent                            was obtained. Prior Anticoagulants: The patient has                            taken no previous anticoagulant or antiplatelet                            agents. ASA Grade Assessment: III - A patient with                            severe systemic disease. After reviewing the risks  and benefits, the patient was deemed in                            satisfactory condition to undergo the procedure.                           After obtaining informed consent, the colonoscope                            was passed under direct vision. Throughout the                            procedure, the patient's blood pressure, pulse, and                             oxygen saturations were monitored continuously. The                            [id number]-[id number] Olympus pediatric colonoscope                            was introduced through the anus and advanced to the                            the cecum, identified by appendiceal orifice and                            ileocecal valve. The colonoscopy was somewhat                            difficult due to poor bowel prep with stool                            present. Successful completion of the procedure was                            aided by lavage. The patient tolerated the                            procedure well. The quality of the bowel                            preparation was inadequate. The terminal ileum,                            ileocecal valve, appendiceal orifice, and rectum                            were photographed. Scope In: 9:23:41 AM Scope Out: 9:37:29 AM Scope Withdrawal Time: 0 hours 10 minutes 8 seconds  Total Procedure Duration: 0 hours 13 minutes 48 seconds  Findings:      The perianal and digital rectal examinations were normal.      A moderate amount of semi-liquid stool was found in the entire colon,  interfering with visualization. Lavage of the area was performed,       resulting in incomplete clearance with fair visualization.      Two sessile polyps were found in the cecum. The polyps were 1 to 2 mm in       size. These polyps were removed with a cold biopsy forceps. Resection       and retrieval were complete.      A patchy area of moderately nodular mucosa was found at the hepatic       flexure and in the ascending colon. Biopsies were taken with a cold       forceps for histology.      Non-bleeding external and internal hemorrhoids were found during       retroflexion. The hemorrhoids were small. Impression:               - Preparation of the colon was inadequate.                           - Stool in the entire examined colon.                            - No obvious mass lesion, obstruction or active                            lower GI bleeding                           - Two 1 to 2 mm polyps in the cecum, removed with a                            cold biopsy forceps. Resected and retrieved.                           - Nodular mucosa at the hepatic flexure and in the                            ascending colon. Biopsied.                           - Non-bleeding external and internal hemorrhoids. Recommendation:           - Patient has a contact number available for                            emergencies. The signs and symptoms of potential                            delayed complications were discussed with the                            patient. Return to normal activities tomorrow.                            Written discharge instructions were provided to the  patient.                           - Resume previous diet.                           - Continue present medications.                           - Await pathology results.                           - Follow up in GI office with Dr.Beavers                           - Repeat colonoscopy at the next available                            appointment as outpatient because the bowel                            preparation was suboptimal.                           - OK to restart anticoagulation today                           - GI will sign off, please call with any questions Procedure Code(s):        --- Professional ---                           [phone], Colonoscopy, flexible; with biopsy, single                            or multiple Diagnosis Code(s):        --- Professional ---                           K64.8, Other hemorrhoids                           K63.5, Polyp of colon                           K63.89, Other specified diseases of intestine                           D50.9, Iron deficiency anemia, unspecified CPT copyright 2019 American Medical  Association. All rights reserved. The codes documented in this report are preliminary and upon coder review may  be revised to meet current compliance requirements. Mauri Pole, MD 08/12/2020 9:54:31 AM This report has been signed electronically. Number of Addenda: 0

## 2020-08-12 NOTE — Interval H&P Note (Signed)
History and Physical Interval Note:  08/12/2020 8:59 AM  Dana Hughes  has presented today for surgery, with the diagnosis of anemia, recent black stool, nausea, upper abdominal pain , weight loss.  The various methods of treatment have been discussed with the patient and family. After consideration of risks, benefits and other options for treatment, the patient has consented to  Procedure(s): COLONOSCOPY WITH PROPOFOL (N/A) ESOPHAGOGASTRODUODENOSCOPY (EGD) WITH PROPOFOL (N/A) as a surgical intervention.  The patient's history has been reviewed, patient examined, no change in status, stable for surgery.  I have reviewed the patient's chart and labs.  Questions were answered to the patient's satisfaction.     Tymere Depuy

## 2020-08-13 LAB — CULTURE, BLOOD (ROUTINE X 2)
Culture: NO GROWTH
Culture: NO GROWTH
Special Requests: ADEQUATE
Special Requests: ADEQUATE

## 2020-08-15 ENCOUNTER — Other Ambulatory Visit: Payer: Self-pay

## 2020-08-16 ENCOUNTER — Other Ambulatory Visit: Payer: Self-pay

## 2020-08-16 ENCOUNTER — Telehealth: Payer: Self-pay

## 2020-08-16 LAB — SURGICAL PATHOLOGY

## 2020-08-16 NOTE — Telephone Encounter (Signed)
Opened in error, duplicate encounter. SChaplin, RN,BSN

## 2020-08-16 NOTE — Telephone Encounter (Signed)
RTC, no answer, no VM SChaplin, RN,BSN  

## 2020-08-16 NOTE — Telephone Encounter (Signed)
Pls contact pt regarding vision [phone]

## 2020-08-18 ENCOUNTER — Other Ambulatory Visit: Payer: Self-pay

## 2020-08-18 ENCOUNTER — Telehealth (HOSPITAL_COMMUNITY): Payer: Self-pay

## 2020-08-18 ENCOUNTER — Encounter: Payer: Medicaid Other | Admitting: Student

## 2020-08-18 NOTE — Telephone Encounter (Signed)
Transitions of Care Pharmacy   Call attempted for a pharmacy transitions of care follow-up. Could not leave voicemail due to phone going straight to busy signal. Call attempt #3. Will no longer attempt follow up.

## 2020-08-23 ENCOUNTER — Other Ambulatory Visit: Payer: Self-pay

## 2020-08-24 ENCOUNTER — Other Ambulatory Visit: Payer: Self-pay

## 2020-08-24 ENCOUNTER — Ambulatory Visit (INDEPENDENT_AMBULATORY_CARE_PROVIDER_SITE_OTHER): Payer: Self-pay | Admitting: Student

## 2020-08-24 ENCOUNTER — Encounter: Payer: Self-pay | Admitting: Student

## 2020-08-24 VITALS — BP 121/84 | HR 89 | Temp 98.9°F | Ht 63.0 in | Wt 191.0 lb

## 2020-08-24 DIAGNOSIS — E611 Iron deficiency: Secondary | ICD-10-CM

## 2020-08-24 DIAGNOSIS — I10 Essential (primary) hypertension: Secondary | ICD-10-CM

## 2020-08-24 DIAGNOSIS — D649 Anemia, unspecified: Secondary | ICD-10-CM

## 2020-08-24 DIAGNOSIS — H538 Other visual disturbances: Secondary | ICD-10-CM

## 2020-08-24 DIAGNOSIS — E119 Type 2 diabetes mellitus without complications: Secondary | ICD-10-CM

## 2020-08-24 DIAGNOSIS — N809 Endometriosis, unspecified: Secondary | ICD-10-CM

## 2020-08-24 DIAGNOSIS — I2699 Other pulmonary embolism without acute cor pulmonale: Secondary | ICD-10-CM

## 2020-08-24 DIAGNOSIS — R102 Pelvic and perineal pain: Secondary | ICD-10-CM

## 2020-08-24 NOTE — Assessment & Plan Note (Addendum)
Assessment: Patient recently admitted to the hospital discharged on 08/12/2020 for a submassive PE.  This was unprovoked in nature, however it was thought that estrogen limitation may been contributing.  These have since been discontinued.  Patient has been on Eliquis since discharge.  Notes adherence to this medication.  Still having some right-sided chest wall pain states her shortness of breath is improved significantly.  Unfortunately she was not in for pick up her oxycodone that was initially prescribed.  Discussed with patient to take Tylenol 1000 mg 3 times a day  Plan: -Continue Eliquis 5 mg, plan is to continue for 3 to 6 months. -Tylenol for pain

## 2020-08-24 NOTE — Progress Notes (Signed)
CC: Posthospitalization follow-up, pulmonary emboli, iron deficiency anemia, hypertension, endometriosis  HPI:  [name] is a 40 y.o. female with a past medical history stated below and presents today for posthospitalization follow-up. Please see problem based assessment and plan for additional details.  Past Medical History:  Diagnosis Date  . Anxiety   . Arthritis   . Asthma   . Bronchitis   . BV (bacterial vaginosis)   . Candidiasis   . Depression   . Diabetes mellitus without complication ([place])   . Endometriosis   . Headache(784.0)    migraines  . Hypertension    no longer takes meds  . Migraines   . Muscle spasm   . Ovarian cyst   . Pelvic pain in female 09/29/2015  . Scoliosis   . Uterine fibroid     Current Outpatient Medications on File Prior to Visit  Medication Sig Dispense Refill  . acetaminophen (TYLENOL) 500 MG tablet Take 2 tablets (1,000 mg total) by mouth every 6 (six) hours as needed for mild pain (or Fever >/= 101). 30 tablet 0  . albuterol (PROVENTIL HFA;VENTOLIN HFA) 108 (90 Base) MCG/ACT inhaler Inhale 2 puffs into the lungs every 6 (six) hours as needed for wheezing or shortness of breath. Reported on 09/29/2015    . albuterol (PROVENTIL) (2.5 MG/3ML) 0.083% nebulizer solution Take 3 mLs (2.5 mg total) by nebulization every 6 (six) hours as needed for wheezing or shortness of breath. 75 mL 0  . AMBULATORY NON FORMULARY MEDICATION Nitroglycerin 0.125% gel apply a small amount to the rectum 3 times daily x 6-8 weeks. 1 each 1  . AMBULATORY NON FORMULARY MEDICATION Medication Name: nitroglycerin 0.125 % gel applied to the rectum TID FOR 6 WEEKS 6-8 WEEKS ( NOT TO BE USED WITH CONCURRENT PHOSPHODIESTERASE INHIBITORS). 30 g 0  . amLODipine (NORVASC) 5 MG tablet TAKE 1 TABLET (5 MG TOTAL) BY MOUTH DAILY. 90 tablet 1  . apixaban (ELIQUIS) 5 MG TABS tablet On 4/22 -4/23:  Take 2 tablets by mouth twice daily Starting on 4/24:  Decrease to 1 tablet by  mouth twice daily Get Refills at [place] 63 tablet 0  . ARIPiprazole (ABILIFY) 5 MG tablet Take 1 tablet (5 mg total) by mouth daily. 30 tablet 2  . artificial tears (LACRILUBE) OINT ophthalmic ointment Place into both eyes at bedtime as needed for dry eyes. 3.5 g 0  . busPIRone (BUSPAR) 10 MG tablet Take 1 tablet (10 mg total) by mouth 3 (three) times daily. 90 tablet 2  . busPIRone (BUSPAR) 10 MG tablet TAKE 1 TABLET (10 MG TOTAL) BY MOUTH 3 (THREE) TIMES DAILY. 90 tablet 2  . busPIRone (BUSPAR) 10 MG tablet TAKE 1 TABLET (10 MG TOTAL) BY MOUTH 3 (THREE) TIMES DAILY. 90 tablet 2  . dicyclomine (BENTYL) 10 MG capsule Take 1 capsule (10 mg total) by mouth 4 (four) times daily as needed for spasms (abdominal pain). 30 capsule 1  . dicyclomine (BENTYL) 10 MG capsule TAKE 1 CAPSULE (10 MG TOTAL) BY MOUTH 4 (FOUR) TIMES DAILY AS NEEDED FOR SPASMS (ABDOMINAL PAIN). 30 capsule 1  . dicyclomine (BENTYL) 10 MG capsule TAKE 1 CAPSULE BY MOUTH 4 TIMES DAILY AS NEEDED FOR SPASMS OR ABDOMINAL PAIN. 30 capsule 1  . Exenatide ER (BYDUREON BCISE) 2 MG/0.85ML AUIJ Inject 2 mg into the skin once a week. 0.85 mL 0  . Exenatide ER 2 MG/0.85ML AUIJ INJECT 2 MG INTO THE SKIN ONCE A WEEK.  3.4 mL 0  . fluticasone (FLONASE) 50 MCG/ACT nasal spray Place 1 spray into both nostrils in the morning and at bedtime. (Patient taking differently: Place 1 spray into both nostrils daily as needed.) 11.1 mL 1  . fluticasone (FLONASE) 50 MCG/ACT nasal spray PLACE 1 SPRAY INTO BOTH NOSTRILS IN THE MORNING AND AT BEDTIME. 16 g 1  . glucose blood test strip Check your sugar in the morning before you eat breakfast, and one hour after a meal. 100 each 2  . glucose monitoring kit (FREESTYLE) monitoring kit 1 each by Does not apply route daily. Check glucose once in the morning before breakfast and 1 hour after a meal 1 each 0  . guaifenesin (ROBITUSSIN) 100 MG/5ML syrup Take 200 mg by mouth 3 (three) times daily  as needed for cough.    . hydrOXYzine (ATARAX/VISTARIL) 10 MG tablet Take 1 tablet (10 mg total) by mouth 3 (three) times daily as needed. (Patient taking differently: Take 10 mg by mouth 3 (three) times daily as needed for anxiety.) 90 tablet 2  . hydrOXYzine (ATARAX/VISTARIL) 10 MG tablet TAKE 1 TABLET (10 MG TOTAL) BY MOUTH 3 (THREE) TIMES DAILY AS NEEDED. 90 tablet 2  . hydrOXYzine (ATARAX/VISTARIL) 10 MG tablet TAKE 1 TABLET (10 MG TOTAL) BY MOUTH 3 (THREE) TIMES DAILY AS NEEDED. 90 tablet 2  . insulin aspart (NOVOLOG) 100 UNIT/ML FlexPen Inject 10 Units into the skin 2 (two) times daily with a meal. 6 mL 2  . insulin aspart (NOVOLOG) 100 UNIT/ML FlexPen INJECT 10 UNITS INTO THE SKIN TWICE DAILY WITH A MEAL. 3 mL 2  . insulin glargine (LANTUS SOLOSTAR) 100 UNIT/ML Solostar Pen Inject 20 Units into the skin daily at 10 pm. (Patient taking differently: Inject 20 Units into the skin at bedtime.) 6 mL 3  . insulin glargine (LANTUS) 100 UNIT/ML Solostar Pen INJECT 20 UNITS INTO THE SKIN DAILY AT 10 PM. 6 mL 3  . insulin glargine (LANTUS) 100 UNIT/ML Solostar Pen INJECT 20 UNITS INTO THE SKIN DAILY AT 10 PM. 3 mL 1  . Insulin Pen Needle (UNIFINE PEN NEEDLES) 32G X 4 MM MISC 1 application by Does not apply route in the morning, at noon, and at bedtime. 200 each 1  . Insulin Pen Needle 32G X 4 MM MISC USE AS DIRECTED IN THE MORNING, NOON AND AT BEDTIME. 200 each 1  . meclizine (ANTIVERT) 25 MG tablet Take 1 tablet (25 mg total) by mouth 3 (three) times daily as needed for dizziness. 30 tablet 0  . metFORMIN (GLUCOPHAGE) 1000 MG tablet Take 1 tablet (1,000 mg total) by mouth 2 (two) times daily with a meal. 90 tablet 3  . mirtazapine (REMERON) 30 MG tablet Take 1 tablet (30 mg total) by mouth at bedtime. 30 tablet 2  . mirtazapine (REMERON) 30 MG tablet TAKE 1 TABLET (30 MG TOTAL) BY MOUTH AT BEDTIME. 30 tablet 2  . mirtazapine (REMERON) 30 MG tablet TAKE 1 TABLET (30 MG TOTAL) BY MOUTH AT BEDTIME. 30  tablet 2  . nystatin (MYCOSTATIN) 100000 UNIT/ML suspension TAKE 5 MLS BY MOUTH FOUR TIMES DAILY FOR 7 DAYS. 180 mL 0  . pantoprazole (PROTONIX) 40 MG tablet Take 1 tablet (40 mg total) by mouth daily. 30 tablet 0  . Polysaccharide Iron Complex 150 MG TABS Take 150 mg by mouth daily. 30 tablet 0   No current facility-administered medications on file prior to visit.    Family History  Problem Relation Age of Onset  .  Cancer Father   . Hypertension Maternal Aunt   . Breast cancer Maternal Aunt   . Cancer Paternal Grandmother   . Breast cancer Paternal Grandmother   . Breast cancer Paternal Aunt     Social History   Socioeconomic History  . Marital status: Single    Spouse name: Not on file  . Number of children: Not on file  . Years of education: Not on file  . Highest education level: Not on file  Occupational History  . Not on file  Tobacco Use  . Smoking status: Former Research scientist (life sciences)  . Smokeless tobacco: Never Used  Vaping Use  . Vaping Use: Never used  Substance and Sexual Activity  . Alcohol use: Not Currently  . Drug use: Yes    Frequency: 1.0 times per week    Types: Marijuana  . Sexual activity: Yes    Birth control/protection: Condom, Surgical  Other Topics Concern  . Not on file  Social History Narrative  . Not on file   Social Determinants of Health   Financial Resource Strain: Not on file  Food Insecurity: Food Insecurity Present  . Worried About Charity fundraiser in the Last Year: Sometimes true  . Ran Out of Food in the Last Year: Often true  Transportation Needs: No Transportation Needs  . Lack of Transportation (Medical): No  . Lack of Transportation (Non-Medical): No  Physical Activity: Not on file  Stress: Not on file  Social Connections: Not on file  Intimate Partner Violence: Not on file    Review of Systems: ROS negative except for what is noted on the assessment and plan.  Vitals:   08/24/20 1548  BP: 121/84  Pulse: 89  Temp: 98.9 F  (37.2 C)  TempSrc: Oral  SpO2: 99%  Weight: 191 lb (86.6 kg)  Height: _0  (1.6 m)     Physical Exam: Constitutional: well-appearing resting comfortably in no acute distress HENT: normocephalic atraumatic Eyes: conjunctiva non-erythematous Neck: supple Cardiovascular: regular rate and rhythm, no m/r/g Pulmonary/Chest: normal work of breathing on room air, lungs clear to auscultation bilaterally Abdominal: soft, non-tender, non-distended MSK: normal bulk and tone Neurological: alert & oriented x 3 Skin: warm and dry Psych: Normal mood and thought process   Assessment & Plan:   See Encounters Tab for problem based charting.  Patient discussed with Dr. Caffie Damme, D.O. [facility], PGY-1 Pager: [phone], Phone: [phone] Date 08/24/2020 Time 6:31 PM

## 2020-08-24 NOTE — Patient Instructions (Signed)
Thank you, Ms.[name] for allowing [place] to provide your care today. Today we discussed   Pulmonary embolism/blood clot in the lungs Please take Tylenol for pain and apply warm compress to the chest wall.  And also try over-the-counter Bengay or IcyHot.  Please continue to take the Eliquis.  If you feel as though you are running low on Eliquis medication please call our clinic for refill.  Endometriosis I put in a referral for a new OB/GYN.  Please use barrier protection such as condoms for contraception use to prevent pregnancy if you are going to continue with intercourse.  Iron deficiency anemia GI bleed Please follow-up with the GI doctors, we will be checking a CBC today.  If you notice frank blood or dark stools persistently please call our office.  High blood pressure Blood pressure looks good today, please continue to not take your medication.  Blurry vision I placed a referral for an eye exam.  History of diabetes I will recheck to Butch Penny and ask her about potentially placing a continuous glucose monitor in your arm they can check with your phone.  I have ordered the following labs for you:   Lab Orders     CBC no Diff     Referrals ordered today:    Referral Orders     Ambulatory referral to Gynecology     Ambulatory referral to Ophthalmology   I have ordered the following medication/changed the following medications:   Stop the following medications: There are no discontinued medications.   Start the following medications: No orders of the defined types were placed in this encounter.    Follow up: 2-3 months    Should you have any questions or concerns please call the internal medicine clinic at [phone].     Sanjuana Letters, D.O. [place]

## 2020-08-25 LAB — CBC
Hematocrit: 32.4 % — ABNORMAL LOW (ref 34.0–46.6)
Hemoglobin: 9.6 g/dL — ABNORMAL LOW (ref 11.1–15.9)
MCH: 23.8 pg — ABNORMAL LOW (ref 26.6–33.0)
MCHC: 29.6 g/dL — ABNORMAL LOW (ref 31.5–35.7)
MCV: 80 fL (ref 79–97)
Platelets: 622 10*3/uL — ABNORMAL HIGH (ref 150–450)
RBC: 4.04 x10E6/uL (ref 3.77–5.28)
RDW: 14.4 % (ref 11.7–15.4)
WBC: 8.7 10*3/uL (ref 3.4–10.8)

## 2020-08-25 NOTE — Assessment & Plan Note (Signed)
Assessment: History of iron deficiency thought to be secondary to GI blood loss.  Patient has follow-up with GI.  Denies dark tarry stools since starting Eliquis.  CBC shows improvement of hemoglobin since admission.  No acute drop and do not suspect acute bleed at this time.   Plan: -Follow-up with GI

## 2020-08-25 NOTE — Assessment & Plan Note (Signed)
Assessment: Blood pressure 121/84.  Amlodipine was stopped while admitted to the hospital.  We will continue to hold at this time as she has had soft blood pressures.   Plan: -Hold amlodipine at this time -Instructed patient to monitor blood pressures at home

## 2020-08-25 NOTE — Assessment & Plan Note (Signed)
Assessment: Patient would like to follow-up with different OB that she has followed in the past, placed referral.   Plan: -Referral placed for new OB/GYN.

## 2020-08-26 NOTE — Progress Notes (Signed)
Internal Medicine Clinic Attending  Case discussed with Dr. Katsadouros  At the time of the visit.  We reviewed the resident's history and exam and pertinent patient test results.  I agree with the assessment, diagnosis, and plan of care documented in the resident's note.  

## 2020-08-29 ENCOUNTER — Encounter: Payer: Medicaid Other | Admitting: Gastroenterology

## 2020-08-31 ENCOUNTER — Other Ambulatory Visit: Payer: Self-pay

## 2020-08-31 ENCOUNTER — Telehealth (HOSPITAL_COMMUNITY): Payer: No Payment, Other | Admitting: Psychiatry

## 2020-09-02 ENCOUNTER — Other Ambulatory Visit (HOSPITAL_COMMUNITY): Payer: Self-pay

## 2020-09-02 ENCOUNTER — Telehealth: Payer: Self-pay | Admitting: *Deleted

## 2020-09-02 MED ORDER — OXYCODONE HCL 5 MG PO TABS
5.0000 mg | ORAL_TABLET | Freq: Two times a day (BID) | ORAL | 0 refills | Status: DC | PRN
  Filled 2020-09-02: qty 10, 5d supply, fill #0

## 2020-09-02 NOTE — Telephone Encounter (Signed)
Patient here as walk in with persistent function-limiting right sided pleuritic chest pain related to recent admission for extensive pulmonary embolisms and possible pulmonary infarctions. She did have infiltrates touching the pleura, which are likely the source of this pleuritic pain. She is anticoagulated. She has not had relief from other supportive care and NSAIDs. I will prescribe low dose oxycodone to be used twice daily as needed for 5 days. We will get her follow up with our office for early next week. Time course of this pain is a little longer than usual, but may represent extensive clot burden.

## 2020-09-02 NOTE — Telephone Encounter (Signed)
Patient walked in c/o continued pain from under right breast down right rib cage. States she has had no relief with Tylenol 1000 mg TID, Icy Hot, warm compresses. Pain is intermittent, sharp, and stabbing, rates 7/10 at present. Pain is keeping her for sleeping at night. Also, c/o occassional SHOB  O2 Sat 100% on RA P 100 BP 101/72 T 98.5  States she never received the oxycodone at hosp d/c as it was sent to [facility] and they do not supply narcotics. Requesting med be sent to [facility]. Discussed with today's Attending Evette Doffing).

## 2020-09-06 ENCOUNTER — Telehealth: Payer: Self-pay

## 2020-09-06 NOTE — Progress Notes (Signed)
   CC: Right-sided chest pain  HPI:  [name] is a 40 y.o. with a recent hospitalization for unprovoked massive PE currently on Eliquis daily endometriosis, GI bleed, hypertension, and diabetes mellitus presenting for persistent right-sided rib pain and chest pain.   Past Medical History:  Diagnosis Date  . Anxiety   . Arthritis   . Asthma   . Bronchitis   . BV (bacterial vaginosis)   . Candidiasis   . Depression   . Diabetes mellitus without complication ([place])   . Endometriosis   . Headache(784.0)    migraines  . Hypertension    no longer takes meds  . Migraines   . Muscle spasm   . Ovarian cyst   . Pelvic pain in female 09/29/2015  . Scoliosis   . Uterine fibroid    Review of Systems:   Constitutional: Negative for chills and fever.  Respiratory: Positive for SOB with exertion and dry cough.   Cardiovascular: Positive for chest pain, negative for leg swelling.  Gastrointestinal: Negative for abdominal pain, nausea and vomiting.  Neurological: Negative for dizziness and headaches.    Physical Exam:  Vitals:   09/07/20 0901  BP: 126/85  Pulse: 99  Temp: 98.6 F (37 C)  TempSrc: Oral  SpO2: 100%  Weight: 190 lb 14.4 oz (86.6 kg)  Height: 5\' 3"  (1.6 m)   Physical Exam Constitutional:      Appearance: She is obese.  Cardiovascular:     Rate and Rhythm: Normal rate and regular rhythm.     Pulses: Normal pulses.     Heart sounds: Normal heart sounds.  Pulmonary:     Effort: Pulmonary effort is normal.     Breath sounds: Normal breath sounds.  Abdominal:     General: Abdomen is flat. Bowel sounds are normal.     Palpations: Abdomen is soft.  Musculoskeletal:        General: Tenderness (TTP over right anterior rib area) present.  Skin:    Capillary Refill: Capillary refill takes less than 2 seconds.  Neurological:     General: No focal deficit present.     Mental Status: She is alert and oriented to person, place, and time.  Psychiatric:         Mood and Affect: Mood normal.        Behavior: Behavior normal.      Assessment & Plan:   See Encounters Tab for problem based charting.  Patient discussed with Dr. Evette Doffing

## 2020-09-06 NOTE — Telephone Encounter (Signed)
Pt is requesting a call back she stated that Dr Raliegh Ip told her to call back if her symptoms did not go away .she stated that she is still having sharps pains down her side  ( right )

## 2020-09-06 NOTE — Telephone Encounter (Signed)
Return call - states she was told to call back, after being a walk-in on Friday,, if the pain continues.  She states the pain is on her right side under her breast, rib cage area. States she did not have this pain until Eliquis was decreased to 1 tab from 2 tabs.I asked if she's taking oxycodone, states she will not have $10.00 until Friday; and OTC Tylenol only last 30 mins before the pain is back. The pain is worse when lying down. Denies sob. Appt became available for tomorrow am @ 0845 AM w/Dr Sherry Ruffing.

## 2020-09-06 NOTE — Telephone Encounter (Signed)
Called pt about her pain - no answer.

## 2020-09-07 ENCOUNTER — Ambulatory Visit (INDEPENDENT_AMBULATORY_CARE_PROVIDER_SITE_OTHER): Payer: Self-pay | Admitting: Internal Medicine

## 2020-09-07 ENCOUNTER — Encounter: Payer: Self-pay | Admitting: Internal Medicine

## 2020-09-07 ENCOUNTER — Other Ambulatory Visit: Payer: Self-pay

## 2020-09-07 ENCOUNTER — Other Ambulatory Visit (HOSPITAL_COMMUNITY): Payer: Self-pay

## 2020-09-07 VITALS — BP 126/85 | HR 99 | Temp 98.6°F | Ht 63.0 in | Wt 190.9 lb

## 2020-09-07 DIAGNOSIS — I10 Essential (primary) hypertension: Secondary | ICD-10-CM

## 2020-09-07 DIAGNOSIS — I2699 Other pulmonary embolism without acute cor pulmonale: Secondary | ICD-10-CM

## 2020-09-07 DIAGNOSIS — F333 Major depressive disorder, recurrent, severe with psychotic symptoms: Secondary | ICD-10-CM

## 2020-09-07 MED ORDER — APIXABAN 5 MG PO TABS
ORAL_TABLET | ORAL | 1 refills | Status: DC
  Filled 2020-09-07 – 2020-09-21 (×2): qty 60, 30d supply, fill #0
  Filled 2020-11-03: qty 60, 30d supply, fill #1

## 2020-09-07 NOTE — Assessment & Plan Note (Signed)
PHQ-9 today is 15, patient reports that she has not been taking the BuSpar because she thought she needed to hold this.  Patient has a follow-up with her psychiatrist later this month.  Advised patient that she can restart her BuSpar.

## 2020-09-07 NOTE — Assessment & Plan Note (Signed)
Patient reports that she is still having pain on her right side, located under her right breast area, it is worse when she is lying down and when she takes deep breaths.  It is a stabbing sensation and typically last for few seconds at a time. She has also been taking Tylenol which she states helps for about 30 to 45 minutes.  She is not been able to pick up the oxycodone because she is not able to afford the $10 prescription.  She does state she gets hot and cold occasionally, and often gets sweaty at night.  She also reports some shortness of the breath with exertion. She is currently taking Eliquis 5 mg daily, takes it every morning around 9:30-10, hasn't missed any recently.  She states that she was taking it twice a day and now is only taking it once a day, she feels like her symptoms have been worse since then she decreased it to once a day.  On exam patient is in no acute distress, occasionally uncomfortable appearing when she takes a deep breath, vitals are stable, patient was ambulated with no drop in her oxygen saturations, on exam her cardiac and pulmonary exam are unremarkable, no crackles, rhonchi, or wheezing noted.  Patient was admitted from 4/18-4/22 for submassive PE, unclear if provoked or unprovoked due to patient on Megace versus questionable malignancy. She was discharged on 10 mg twice daily for 2 days and then was supposed to be on Eliquis 5 mg twice daily after that.  Today she is hemodynamically stable with no significant worsening of her symptoms, no evidence of pneumonia or worsening SOB concerning for CTEPH noted.  We discussed that she should be on the 5 mg Eliquis twice a day and advised patient to restart taking like this.  Also advised patient to pick up her oxycodone and that she can continue Tylenol as needed for now. -Eliquis 5 mg twice daily, refilled medication -Continue Tylenol as needed -Continue oxycodone

## 2020-09-07 NOTE — Patient Instructions (Addendum)
[name],  It was a pleasure to see you today. Thank you for coming in.   Today we discussed your right rib pain. I am sorry that you are still having pain.  Please start taking the eliquis 5 mg twice a day, I have sent in a refill for this medications.  Please pick up the oxycodone that has been prescribed, you can take this as needed.  You can continue taking tylenol as needed.  We checked your oxygen while you were walking and your oxygen saturation stayed in a good range.   We also discussed your blood pressure. This looks good today. You do not need to restart any medications for this.   Please return to clinic in 3 months or sooner if needed.   Thank you again for coming in.   Asencion Noble.D.

## 2020-09-07 NOTE — Assessment & Plan Note (Signed)
Currently off any BP medications at this time.  BP today well controlled at 126/85.  We will not add any medications at this time.  Remove amlodipine from medication list.

## 2020-09-08 ENCOUNTER — Ambulatory Visit: Payer: Medicaid Other | Admitting: Nurse Practitioner

## 2020-09-08 NOTE — Progress Notes (Signed)
Internal Medicine Clinic Attending ° °Case discussed with Dr. Krienke  At the time of the visit.  We reviewed the resident’s history and exam and pertinent patient test results.  I agree with the assessment, diagnosis, and plan of care documented in the resident’s note.  °

## 2020-09-09 ENCOUNTER — Telehealth: Payer: Self-pay | Admitting: *Deleted

## 2020-09-09 NOTE — Telephone Encounter (Signed)
Patient called in stating pain under right breast/ribs is worse after taking oxycodone. States she cannot even lie down on right side. Advised patient to head directly to ED. May need more imaging s/p PEs. She is in agreement.

## 2020-09-09 NOTE — Telephone Encounter (Signed)
Seen, agree with plan. 

## 2020-09-12 ENCOUNTER — Encounter (HOSPITAL_COMMUNITY): Payer: No Payment, Other | Admitting: Psychiatry

## 2020-09-13 ENCOUNTER — Encounter: Payer: Self-pay | Admitting: Gastroenterology

## 2020-09-14 ENCOUNTER — Other Ambulatory Visit: Payer: Self-pay

## 2020-09-21 ENCOUNTER — Other Ambulatory Visit: Payer: Self-pay

## 2020-09-21 ENCOUNTER — Other Ambulatory Visit (HOSPITAL_COMMUNITY): Payer: Self-pay

## 2020-09-21 MED FILL — Insulin Aspart Soln Pen-injector 100 Unit/ML: SUBCUTANEOUS | 15 days supply | Qty: 3 | Fill #0 | Status: CN

## 2020-09-21 MED FILL — Insulin Glargine Soln Pen-Injector 100 Unit/ML: SUBCUTANEOUS | 15 days supply | Qty: 3 | Fill #0 | Status: CN

## 2020-09-22 ENCOUNTER — Other Ambulatory Visit: Payer: Self-pay

## 2020-10-07 ENCOUNTER — Encounter: Payer: Self-pay | Admitting: *Deleted

## 2020-10-07 ENCOUNTER — Ambulatory Visit: Payer: Medicaid Other | Admitting: Obstetrics & Gynecology

## 2020-10-07 NOTE — Addendum Note (Signed)
Addended by: Yvonna Alanis E on: 10/07/2020 12:06 PM   Modules accepted: Orders

## 2020-10-10 ENCOUNTER — Encounter: Payer: Medicaid Other | Admitting: Pharmacist

## 2020-10-11 ENCOUNTER — Other Ambulatory Visit: Payer: Self-pay

## 2020-10-11 ENCOUNTER — Telehealth (INDEPENDENT_AMBULATORY_CARE_PROVIDER_SITE_OTHER): Payer: No Payment, Other | Admitting: Psychiatry

## 2020-10-11 ENCOUNTER — Encounter (HOSPITAL_COMMUNITY): Payer: Self-pay | Admitting: Psychiatry

## 2020-10-11 DIAGNOSIS — F333 Major depressive disorder, recurrent, severe with psychotic symptoms: Secondary | ICD-10-CM

## 2020-10-11 DIAGNOSIS — F411 Generalized anxiety disorder: Secondary | ICD-10-CM | POA: Diagnosis not present

## 2020-10-11 MED ORDER — ARIPIPRAZOLE 10 MG PO TABS
10.0000 mg | ORAL_TABLET | Freq: Every day | ORAL | 2 refills | Status: DC
  Filled 2020-10-11 – 2020-12-08 (×2): qty 30, 30d supply, fill #0

## 2020-10-11 MED ORDER — MIRTAZAPINE 30 MG PO TABS
ORAL_TABLET | ORAL | 2 refills | Status: DC
  Filled 2020-10-11 – 2020-12-08 (×2): qty 30, 30d supply, fill #0

## 2020-10-11 MED ORDER — BUSPIRONE HCL 10 MG PO TABS
10.0000 mg | ORAL_TABLET | Freq: Three times a day (TID) | ORAL | 2 refills | Status: DC
  Filled 2020-10-11 – 2020-12-08 (×2): qty 90, 30d supply, fill #0

## 2020-10-11 MED ORDER — HYDROXYZINE HCL 25 MG PO TABS
25.0000 mg | ORAL_TABLET | Freq: Three times a day (TID) | ORAL | 2 refills | Status: DC | PRN
  Filled 2020-10-11 – 2020-12-08 (×2): qty 90, 30d supply, fill #0
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
## 2020-10-17 ENCOUNTER — Encounter: Payer: Medicaid Other | Admitting: Pharmacist

## 2020-10-25 ENCOUNTER — Encounter: Payer: Self-pay | Admitting: *Deleted

## 2020-10-26 ENCOUNTER — Other Ambulatory Visit: Payer: Self-pay

## 2020-10-26 ENCOUNTER — Encounter (HOSPITAL_COMMUNITY): Payer: Self-pay | Admitting: Emergency Medicine

## 2020-10-26 ENCOUNTER — Encounter (HOSPITAL_COMMUNITY): Payer: Self-pay | Admitting: *Deleted

## 2020-10-26 ENCOUNTER — Emergency Department (HOSPITAL_COMMUNITY)
Admission: EM | Admit: 2020-10-26 | Discharge: 2020-10-26 | Disposition: A | Discharge status: Home / Self Care | Payer: Medicaid Other | Attending: Emergency Medicine | Admitting: Emergency Medicine

## 2020-10-26 ENCOUNTER — Ambulatory Visit (HOSPITAL_COMMUNITY)
Admission: EM | Admit: 2020-10-26 | Discharge: 2020-10-26 | Disposition: A | Discharge status: Home / Self Care | Payer: Self-pay | Attending: Internal Medicine | Admitting: Internal Medicine

## 2020-10-26 DIAGNOSIS — Z5321 Procedure and treatment not carried out due to patient leaving prior to being seen by health care provider: Secondary | ICD-10-CM | POA: Insufficient documentation

## 2020-10-26 DIAGNOSIS — G43909 Migraine, unspecified, not intractable, without status migrainosus: Secondary | ICD-10-CM | POA: Insufficient documentation

## 2020-10-26 DIAGNOSIS — R5383 Other fatigue: Secondary | ICD-10-CM

## 2020-10-26 DIAGNOSIS — R519 Headache, unspecified: Secondary | ICD-10-CM

## 2020-10-26 LAB — BASIC METABOLIC PANEL
Anion gap: 8 (ref 5–15)
BUN: <5 mg/dL — ABNORMAL LOW (ref 6–20)
CO2: 26 mmol/L (ref 22–32)
Calcium: 9.4 mg/dL (ref 8.9–10.3)
Chloride: 102 mmol/L (ref 98–111)
Creatinine, Ser: 0.6 mg/dL (ref 0.44–1.00)
GFR, Estimated: >60 mL/min (ref >60)
Glucose, Bld: 122 mg/dL — ABNORMAL HIGH (ref 70–99)
Potassium: 3 mmol/L — ABNORMAL LOW (ref 3.5–5.1)
Sodium: 136 mmol/L (ref 135–145)

## 2020-10-26 LAB — I-STAT BETA HCG BLOOD, ED (MC, WL, AP ONLY): I-stat hCG, quantitative: <5 m[IU]/mL (ref <5)

## 2020-10-26 LAB — CBC WITH DIFFERENTIAL/PLATELET
Abs Immature Granulocytes: 0.22 10*3/uL — ABNORMAL HIGH (ref 0.00–0.07)
Basophils Absolute: 0 10*3/uL (ref 0.0–0.1)
Basophils Relative: 0 %
Eosinophils Absolute: 0.2 10*3/uL (ref 0.0–0.5)
Eosinophils Relative: 1 %
HCT: 31.6 % — ABNORMAL LOW (ref 36.0–46.0)
Hemoglobin: 9.1 g/dL — ABNORMAL LOW (ref 12.0–15.0)
Immature Granulocytes: 1 %
Lymphocytes Relative: 12 %
Lymphs Abs: 2.1 10*3/uL (ref 0.7–4.0)
MCH: 22.5 pg — ABNORMAL LOW (ref 26.0–34.0)
MCHC: 28.8 g/dL — ABNORMAL LOW (ref 30.0–36.0)
MCV: 78.2 fL — ABNORMAL LOW (ref 80.0–100.0)
Monocytes Absolute: 0.8 10*3/uL (ref 0.1–1.0)
Monocytes Relative: 5 %
Neutro Abs: 13.6 10*3/uL — ABNORMAL HIGH (ref 1.7–7.7)
Neutrophils Relative %: 81 %
Platelets: 595 10*3/uL — ABNORMAL HIGH (ref 150–400)
RBC: 4.04 MIL/uL (ref 3.87–5.11)
RDW: 16 % — ABNORMAL HIGH (ref 11.5–15.5)
WBC: 16.9 10*3/uL — ABNORMAL HIGH (ref 4.0–10.5)
nRBC: 0 % (ref 0.0–0.2)

## 2020-10-26 LAB — CBG MONITORING, ED: Glucose-Capillary: 150 mg/dL — ABNORMAL HIGH (ref 70–99)

## 2020-10-26 NOTE — ED Provider Notes (Signed)
[place]    CSN: [id number] Arrival date & time: 10/26/20  1002      History   Chief Complaint Chief Complaint  Patient presents with   migraines    HPI CATE ORAVEC is a 40 y.o. female.   I was called to evaluate patient in triage given abnormal behavior.  Reports that while she was sitting in the lobby she began biting her phone and dozing off prompting evaluation by triage nurse.  In triage, she was noted to be lethargic with slurred speech but otherwise neurologically intact.  Upon questioning she reports having a severe headache for the last 3 weeks that has worsened in the past 24 hours.  She reports headache pain is rated 10 on a 0-10 pain scale, localized to head without radiation, described as sharp, no aggravating relieving factors identified.  She reports this is the worst headache of her life.  She does have a history of migraines and states this is similar but more extreme than previous episodes of this condition.  Denies any medication changes.  Patient is currently prescribed Eliquis but she denies any head injury.  Upon questioning, patient reports that she is extremely tired because she has not been sleeping well due to headache pain.  She denies any history of TIA or CVA.  She was seen in the emergency room earlier today but decided to leave and be evaluated in urgent care.  No imaging was obtained but blood counts did show significant leukocytosis with white count of 16.9 as well as hypokalemia with potassium of 3.0.  She is diabetic but denies any recent hypoglycemic episodes and blood sugar was checked in clinic which was found to be 150.   Past Medical History:  Diagnosis Date   Anxiety    Arthritis    Asthma    Bronchitis    BV (bacterial vaginosis)    Candidiasis    Depression    Diabetes mellitus without complication ([place])    Endometriosis    Headache(784.0)    migraines   Hypertension    no longer takes meds   Migraines    Muscle spasm     Ovarian cyst    Pelvic pain in female 09/29/2015   Scoliosis    Uterine fibroid     Patient Active Problem List   Diagnosis Date Noted   Mild concentric left ventricular hypertrophy (LVH) 08/12/2020   Sessile colonic polyp 08/12/2020   Abnormal colonoscopy 08/12/2020   Vaginal yeast infection 08/12/2020   Thrombocytosis 08/12/2020   Iron deficiency anemia due to chronic blood loss    Symptomatic anemia    T2DM (type 2 diabetes mellitus) ([place]) 08/09/2020   Unintentional weight loss 08/09/2020   Hypokalemia 08/08/2020   Hypomagnesemia 08/08/2020   Bilateral pulmonary embolism (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/2020   Generalized anxiety disorder 03/11/2020   Type 2 diabetes mellitus without complication, without long-term current use of insulin ([place]) 12/09/2019   Abnormal uterine bleeding (AUB) 02/20/2019   History of bilateral tubal ligation 02/20/2019   Carpal tunnel syndrome, bilateral 01/16/2019   Hypertension 07/21/2018   Endometriosis  08/30/2017   Pelvic pain 09/29/2015    Past Surgical History:  Procedure Laterality Date   BIOPSY  08/12/2020   Procedure: BIOPSY;  Surgeon: Mauri Pole, MD;  Location: [facility];  Service: Endoscopy;;   CHOLECYSTECTOMY     COLONOSCOPY WITH PROPOFOL N/A 08/12/2020   Procedure: COLONOSCOPY WITH PROPOFOL;  Surgeon: Mauri Pole, MD;  Location: [facility];  Service: Endoscopy;  Laterality: N/A;   ESOPHAGOGASTRODUODENOSCOPY (EGD) WITH PROPOFOL N/A 08/12/2020   Procedure: ESOPHAGOGASTRODUODENOSCOPY (EGD) WITH PROPOFOL;  Surgeon: Mauri Pole, MD;  Location: [facility];  Service: Endoscopy;  Laterality: N/A;   POLYPECTOMY  08/12/2020   Procedure: POLYPECTOMY;  Surgeon: Mauri Pole, MD;  Location: [facility];  Service: Endoscopy;;   TUBAL LIGATION      OB History     Gravida  4   Para  3   Term  3   Preterm  0   AB  1   Living  3      SAB  1   IAB  0   Ectopic  0   Multiple  0   Live Births  3        Obstetric Comments  SVD x 3          Home Medications    Prior to Admission medications   Medication Sig Start Date End Date Taking? Authorizing Provider  acetaminophen (TYLENOL) 500 MG tablet Take 2 tablets (1,000 mg total) by mouth every 6 (six) hours as needed for mild pain (or Fever >/= 101). 08/12/20   Christian, [name], MD  albuterol (PROVENTIL HFA;VENTOLIN HFA) 108 (90 Base) MCG/ACT inhaler Inhale 2 puffs into the lungs every 6 (six) hours as needed for wheezing or shortness of breath. Reported on 09/29/2015    [provider]  albuterol (PROVENTIL) (2.5 MG/3ML) 0.083% nebulizer solution Take 3 mLs (2.5 mg total) by nebulization every 6 (six) hours as needed for wheezing or shortness of breath. 07/02/18   Wurst, [place], PA-C  AMBULATORY NON FORMULARY MEDICATION Nitroglycerin 0.125% gel apply a small amount to the rectum 3 times daily x 6-8 weeks. 06/14/20   Thornton Park, MD  AMBULATORY NON FORMULARY MEDICATION Medication Name: nitroglycerin 0.125 % gel applied to the rectum TID FOR 6 WEEKS 6-8 WEEKS ( NOT TO BE USED WITH CONCURRENT PHOSPHODIESTERASE INHIBITORS). 06/22/20   Thornton Park, MD  apixaban (ELIQUIS) 5 MG TABS tablet Take 1 tablet (5 mg ) twice a day. Get Refills at [facility] 09/07/20   Asencion Noble, MD  ARIPiprazole (ABILIFY) 10 MG tablet Take 1 tablet (10 mg total) by mouth daily. 10/11/20   Salley Slaughter, NP  artificial tears (LACRILUBE) OINT ophthalmic ointment Place into both eyes at bedtime as needed for dry eyes.  05/02/18   Langston Masker B, PA-C  busPIRone (BUSPAR) 10 MG tablet Take 1 tablet (10 mg total) by mouth 3 (three) times daily. 10/11/20   Salley Slaughter, NP  dicyclomine (BENTYL) 10 MG capsule Take 1 capsule (10 mg total) by mouth 4 (four) times daily as needed for spasms (abdominal pain). 06/20/20   Jose Persia, MD  dicyclomine (BENTYL) 10 MG capsule TAKE 1 CAPSULE (10 MG TOTAL) BY MOUTH 4 (FOUR) TIMES DAILY AS NEEDED FOR SPASMS (ABDOMINAL PAIN). 06/14/20 06/14/21  Thornton Park, MD  dicyclomine (BENTYL) 10 MG capsule TAKE 1 CAPSULE BY MOUTH 4 TIMES DAILY AS NEEDED FOR SPASMS OR ABDOMINAL PAIN. 06/20/20 06/20/21  Jose Persia, MD  Exenatide  ER (BYDUREON BCISE) 2 MG/0.85ML AUIJ Inject 2 mg into the skin once a week. 07/19/20   Sanjuan Dame, MD  Exenatide ER 2 MG/0.85ML AUIJ INJECT 2 MG INTO THE SKIN ONCE A WEEK. 07/19/20 07/19/21  Sanjuan Dame, MD  fluticasone (FLONASE) 50 MCG/ACT nasal spray Place 1 spray into both nostrils in the morning and at bedtime. Patient taking differently: Place 1 spray into both nostrils daily as needed. 06/20/20   Jose Persia, MD  fluticasone (FLONASE) 50 MCG/ACT nasal spray PLACE 1 SPRAY INTO BOTH NOSTRILS IN THE MORNING AND AT BEDTIME. 06/20/20 06/20/21  Jose Persia, MD  glucose blood test strip Check your sugar in the morning before you eat breakfast, and one hour after a meal. 11/12/19   Melynda Ripple, MD  glucose monitoring kit (FREESTYLE) monitoring kit 1 each by Does not apply route daily. Check glucose once in the morning before breakfast and 1 hour after a meal 11/12/19   Melynda Ripple, MD  guaifenesin (ROBITUSSIN) 100 MG/5ML syrup Take 200 mg by mouth 3 (three) times daily as needed for cough.    [provider]  hydrOXYzine (ATARAX/VISTARIL) 25 MG tablet Take 1 tablet (25 mg total) by mouth 3 (three) times daily as needed. 10/11/20   Salley Slaughter, NP  insulin aspart (NOVOLOG) 100 UNIT/ML FlexPen Inject 10 Units into the  skin 2 (two) times daily with a meal. 06/03/20   Jose Persia, MD  insulin aspart (NOVOLOG) 100 UNIT/ML FlexPen INJECT 10 UNITS INTO THE SKIN TWICE DAILY WITH A MEAL. 02/19/20 02/18/21  [place], Carley D, DO  insulin glargine (LANTUS SOLOSTAR) 100 UNIT/ML Solostar Pen Inject 20 Units into the skin daily at 10 pm. Patient taking differently: Inject 20 Units into the skin at bedtime. 06/03/20   Jose Persia, MD  insulin glargine (LANTUS) 100 UNIT/ML Solostar Pen INJECT 20 UNITS INTO THE SKIN DAILY AT 10 PM. 06/03/20 06/03/21  Jose Persia, MD  insulin glargine (LANTUS) 100 UNIT/ML Solostar Pen INJECT 20 UNITS INTO THE SKIN DAILY AT 10 PM. 02/19/20 02/18/21  [place], Carley D, DO  Insulin Pen Needle (UNIFINE PEN NEEDLES) 32G X 4 MM MISC 1 application by Does not apply route in the morning, at noon, and at bedtime. 02/19/20   [place], Carley D, DO  Insulin Pen Needle 32G X 4 MM MISC USE AS DIRECTED IN THE MORNING, NOON AND AT BEDTIME. 02/19/20 02/18/21  [place], Carley D, DO  meclizine (ANTIVERT) 25 MG tablet Take 1 tablet (25 mg total) by mouth 3 (three) times daily as needed for dizziness. 05/06/18   Melynda Ripple, MD  metFORMIN (GLUCOPHAGE) 1000 MG tablet Take 1 tablet (1,000 mg total) by mouth 2 (two) times daily with a meal. 12/09/19 12/08/20  Masoudi, Dorthula Rue, MD  mirtazapine (REMERON) 30 MG tablet TAKE 1 TABLET (30 MG TOTAL) BY MOUTH AT BEDTIME. 10/11/20 10/11/21  Eulis Canner E, NP  nystatin (MYCOSTATIN) 100000 UNIT/ML suspension TAKE 5 MLS BY MOUTH FOUR TIMES DAILY FOR 7 DAYS. 06/20/20 06/20/21  Jose Persia, MD  oxyCODONE (OXY IR/ROXICODONE) 5 MG immediate release tablet Take 1 tablet (5 mg total) by mouth 2 (two) times daily as needed for severe pain. 09/02/20   Axel Filler, MD  pantoprazole (PROTONIX) 40 MG tablet Take 1 tablet (40 mg total) by mouth daily. 08/12/20   Mitzi Hansen, MD  Polysaccharide Iron Complex 150 MG TABS Take 150 mg by mouth daily.  04/07/20   Sanjuan Dame, MD    Family History Family History  Problem Relation  Age of Onset   Cancer Father    Hypertension Maternal Aunt    Breast cancer Maternal Aunt    Cancer Paternal Grandmother    Breast cancer Paternal Grandmother    Breast cancer Paternal Aunt     Social History Social History   Tobacco Use   Smoking status: Former    Pack years: 0.00   Smokeless tobacco: Never  Vaping Use   Vaping Use: Never used  Substance Use Topics   Alcohol use: Not Currently   Drug use: Yes    Frequency: 1.0 times per week    Types: Marijuana     Allergies   Mushroom extract complex, Latex, and Penicillins   Review of Systems Review of Systems  Constitutional:  Negative for activity change, appetite change, fatigue and fever.  Eyes:  Negative for photophobia and visual disturbance.  Respiratory:  Negative for cough and shortness of breath.   Cardiovascular:  Negative for chest pain.  Gastrointestinal:  Negative for abdominal pain, diarrhea, nausea and vomiting.  Neurological:  Positive for headaches. Negative for dizziness and light-headedness.    Physical Exam Triage Vital Signs ED Triage Vitals  Enc Vitals Group     BP 10/26/20 1109 108/85     Pulse Rate 10/26/20 1109 94     Resp 10/26/20 1109 20     Temp 10/26/20 1109 98.8 F (37.1 C)     Temp src --      SpO2 10/26/20 1109 100 %     Weight --      Height --      Head Circumference --      Peak Flow --      Pain Score 10/26/20 1110 10     Pain Loc --      Pain Edu? --      Excl. in [place]? --    No data found.  Updated Vital Signs BP 130/80 (BP Location: Left Arm)   Pulse 93   Temp 98.8 F (37.1 C)   Resp 16   LMP 10/20/2020   SpO2 99%   Visual Acuity Right Eye Distance:   Left Eye Distance:   Bilateral Distance:    Right Eye Near:   Left Eye Near:    Bilateral Near:     Physical Exam Vitals reviewed.  Constitutional:      General: She is not in acute distress.    Appearance:  Normal appearance. She is normal weight. She is not ill-appearing.     Comments: Pleasant female appears lethargic sitting in wheelchair in triage room  HENT:     Head: Normocephalic and atraumatic.     Mouth/Throat:     Tongue: Tongue does not deviate from midline.  Eyes:     Extraocular Movements: Extraocular movements intact.     Pupils: Pupils are equal, round, and reactive to light.  Cardiovascular:     Rate and Rhythm: Normal rate and regular rhythm.     Heart sounds: Normal heart sounds, S1 normal and S2 normal. No murmur heard. Pulmonary:     Effort: Pulmonary effort is normal.     Breath sounds: Normal breath sounds. No wheezing, rhonchi or rales.     Comments: Clear to auscultation bilaterally Musculoskeletal:     Cervical back: Normal range of motion and neck supple.  Neurological:     General: No focal deficit present.     Mental Status: She is oriented to person, place, and time and easily aroused. She is lethargic.  Cranial Nerves: Cranial nerves are intact.     Motor: No weakness.     Coordination: Romberg sign negative.     Comments: Cranial nerves II through XII intact.  No focal neurological defect on exam.  Patient alert and oriented x3.  Eyes remain closed but she will open them on command.  She appears lethargic and is slurring words but otherwise has appropriate speech.  Psychiatric:        Behavior: Behavior is cooperative.     UC Treatments / Results  Labs (all labs ordered are listed, but only abnormal results are displayed) Labs Reviewed  CBG MONITORING, ED - Abnormal; Notable for the following components:      Result Value   Glucose-Capillary 150 (*)    All other components within normal limits    EKG   Radiology No results found.  Procedures Procedures (including critical care time)  Medications Ordered in UC Medications - No data to display  Initial Impression / Assessment and Plan / UC Course  I have reviewed the triage vital signs  and the nursing notes.  Pertinent labs & imaging results that were available during my care of the patient were reviewed by me and considered in my medical decision making (see chart for details).     Discussed need for head CT and further evaluation which is not available in urgent care given presentation.  Patient initially declined this and requested shot of Toradol but discussed if she does have any kind of intracranial hemorrhage this could exacerbate symptoms and so was not willing to provide that.  Discussed potential utility of calling EMS for transport but patient declined this as her sister is with her and will drive her directly to [facility] emergency room following visit.  Patient ultimately agreed to go to the emergency room and will go directly to ER via private vehicle following visit.  Vital signs were stable at the time of discharge.   Final Clinical Impressions(s) / UC Diagnoses   Final diagnoses:  Severe headache  Lethargy     Discharge Instructions      GO TO ER.     ED Prescriptions   None    PDMP not reviewed this encounter.   Terrilee Croak, PA-C 10/26/20 1128

## 2020-10-26 NOTE — ED Notes (Signed)
Pt decided left emergency department

## 2020-10-26 NOTE — Discharge Instructions (Signed)
GO TO ER °

## 2020-10-26 NOTE — ED Triage Notes (Signed)
Patient reports persistent migraine headache with photophobia and emesis for several weeks unrelieved by OTC pain medications .

## 2020-11-01 ENCOUNTER — Telehealth (HOSPITAL_COMMUNITY): Payer: Self-pay | Admitting: *Deleted

## 2020-11-01 ENCOUNTER — Telehealth (HOSPITAL_COMMUNITY): Payer: Self-pay | Admitting: Psychiatry

## 2020-11-01 NOTE — Telephone Encounter (Signed)
Thank you for calling the patient.  [facility]fully [facility] can assist her with patient care assistance and have her medications filled for a reduced price.

## 2020-11-01 NOTE — Telephone Encounter (Signed)
Patient reporting she has not been able to fill her prescriptions due to cost. Pt asking if provider can recommend or assist with obtaining all medications [facility] prescribed. Confirmed pt ph [phone]

## 2020-11-01 NOTE — Telephone Encounter (Signed)
Per front desk staff patient called re the expense of her medicine. Called her back and [facility] which [facility] the most affordable pharmacy I am aware of. She hadnt gone over to see how much it would cost, she said she doesn't have any money and wanted to know what I could do to help. I asked her to call them and have them price her two Rx's to see what they would cost but I am unable to make them free.

## 2020-11-03 ENCOUNTER — Other Ambulatory Visit (HOSPITAL_COMMUNITY): Payer: Self-pay

## 2020-11-03 ENCOUNTER — Other Ambulatory Visit: Payer: Self-pay

## 2020-11-03 ENCOUNTER — Telehealth: Payer: Self-pay | Admitting: *Deleted

## 2020-11-03 MED FILL — Insulin Pen Needle 32 G X 4 MM (1/6" or 5/32"): 30 days supply | Qty: 100 | Fill #0 | Status: CN

## 2020-11-03 NOTE — Telephone Encounter (Signed)
Patient called in wanting to make an appt for blood clot on right breast and was transferred to Triage. States she has a known clot in right lung and now having severe right breast pain that is keeping her from sleeping at night. Advised to head directly to ED. Recommended [facility] as patients are usually able to be seen more quickly there. She is in agreement. Contact info given.

## 2020-11-04 ENCOUNTER — Other Ambulatory Visit: Payer: Self-pay | Admitting: Student

## 2020-11-04 ENCOUNTER — Other Ambulatory Visit (HOSPITAL_COMMUNITY): Payer: Self-pay

## 2020-11-04 ENCOUNTER — Other Ambulatory Visit: Payer: Self-pay | Admitting: Student in an Organized Health Care Education/Training Program

## 2020-11-04 ENCOUNTER — Encounter: Payer: Self-pay | Admitting: Student

## 2020-11-04 NOTE — Telephone Encounter (Signed)
Placed on Dr. Johnney Ou' schedule today at 1345.

## 2020-11-04 NOTE — Telephone Encounter (Signed)
[name]  Patient Appointment Schedule Request Pool 5 hours ago (4:27 AM)    Appointment Request From: Dana Hughes   With Provider: Jose Persia, MD [facility]]   Preferred Date Range: 11/15/2020 - 11/16/2020   Preferred Times: Any Time   Reason for visit: Office Visit   Comments: Bladder infection or yeast infection need to have the large blood clot check out of could please an thank you.

## 2020-11-04 NOTE — Telephone Encounter (Signed)
Received below message from [facility] staff. Patient requesting appt for 7/26 or 7/27 for issue we advised her to go to ED yesterday. Call placed to patient. Explained we cannot make an appt for 11-12 days from now for these issues. Recommendation is still to go directly to ED. She states she will try to go today.

## 2020-11-07 ENCOUNTER — Other Ambulatory Visit (HOSPITAL_COMMUNITY): Payer: Self-pay

## 2020-11-08 NOTE — Telephone Encounter (Signed)
Just spoke with patient and she agreed to an appointment tomorrow 11/09/2020 at 1:45 pm.

## 2020-11-09 ENCOUNTER — Other Ambulatory Visit: Payer: Self-pay

## 2020-11-09 ENCOUNTER — Other Ambulatory Visit: Payer: Self-pay | Admitting: Student

## 2020-11-09 ENCOUNTER — Other Ambulatory Visit (HOSPITAL_COMMUNITY): Payer: Self-pay

## 2020-11-09 ENCOUNTER — Ambulatory Visit (INDEPENDENT_AMBULATORY_CARE_PROVIDER_SITE_OTHER): Payer: Self-pay | Admitting: Student

## 2020-11-09 DIAGNOSIS — E119 Type 2 diabetes mellitus without complications: Secondary | ICD-10-CM

## 2020-11-09 NOTE — Telephone Encounter (Signed)
Pt called to state she would be late for her 1:45 pm appt today with Dr. Alita Chyle.  Pt arrived around 2:10 pm and was informed that MD will see his 2:15 pt first.  Pt stated she was hungry and gets cranky when she doesn't eat and requested to have appt rescheduled. Regenia Skeeter, Tarini Carrier Cassady7/20/20222:53 PM

## 2020-11-09 NOTE — Telephone Encounter (Signed)
Received third RX request in the last week for oxycodone. [name] was previously prescribed 10 tablets in May following her hospitalization for bilateral pulmonary embolism. PDMP reviewed, no other narcotic medications have been prescribed since this time. [name] has called our office multiple times for pain, for which we have directed her to the Emergency Room in addition to offering a telehealth appointment, none of which she has done. Today, [name] called our office prior to her scheduled 1:45PM appointment stating that she overslept and would be running a little late. She arrived at 2:10PM and was offered an appointment after my scheduled 2:15PM appointment was completed. She refused this and rescheduled for next week. At this juncture, it is not appropriate to re-fill oxycodone without an in-person evaluation.

## 2020-11-09 NOTE — Progress Notes (Signed)
Received third RX request in the last week for oxycodone. [name] was previously prescribed 10 tablets in May following her hospitalization for bilateral pulmonary embolism. PDMP reviewed, no other narcotic medications have been prescribed since this time. [name] has called our office multiple times for pain, for which we have directed her to the Emergency Room in addition to offering a telehealth appointment, none of which she has done. Today, [name] called our office prior to her scheduled 1:45PM appointment stating that she overslept and would be running a little late. She arrived at 2:10PM and was offered an appointment after my scheduled 2:15PM appointment was completed. She refused this and rescheduled for next week. At this juncture, it is not appropriate to re-fill oxycodone without an in-person evaluation.   Sanjuan Dame, MD Internal Medicine PGY-2 [phone]

## 2020-11-10 NOTE — Progress Notes (Signed)
Patient arrived 25 minutes late to appointment. Another was offered later in the afternoon, but patient elected to re-schedule. Will follow-up with [facility] next week.  Sanjuan Dame, MD Internal Medicine PGY-2

## 2020-11-11 ENCOUNTER — Other Ambulatory Visit (HOSPITAL_COMMUNITY): Payer: Self-pay

## 2020-11-15 NOTE — Progress Notes (Signed)
Submitted application for ELIQUIS to [employer]) for patient assistance.   Phone: [phone]

## 2020-11-16 ENCOUNTER — Encounter: Payer: Self-pay | Admitting: Student

## 2020-11-17 NOTE — Progress Notes (Signed)
Received notification from [place] ([place]) regarding approval for [employer]. Patient assistance approved from 11/16/20 to 11/15/21.  Medication will ship to patients home  Phone: [phone]

## 2020-11-25 ENCOUNTER — Telehealth (HOSPITAL_COMMUNITY): Payer: Self-pay | Admitting: Licensed Clinical Social Worker

## 2020-11-25 ENCOUNTER — Ambulatory Visit (HOSPITAL_COMMUNITY): Payer: No Payment, Other | Admitting: Licensed Clinical Social Worker

## 2020-11-25 NOTE — Telephone Encounter (Signed)
LCSW sent two links to pt phone with no response. LCSW f/u with a PC and pt stated she called yesterday and stated she needed to reschedule. Pt re scheduled for sept 29th at 11am

## 2020-12-03 ENCOUNTER — Telehealth: Payer: Self-pay | Admitting: Gastroenterology

## 2020-12-03 ENCOUNTER — Encounter: Payer: Self-pay | Admitting: Gastroenterology

## 2020-12-03 NOTE — Telephone Encounter (Signed)
Patient sent the following message via [facility] appointment request pool.  " Hi good morning this [name] if I'm bleeding from my rectum agai n with meat or hemorrhoids coming out then go back in and I have these little splits around my rectum area I'm getting frustrated and worried please let me know something "

## 2020-12-05 ENCOUNTER — Other Ambulatory Visit: Payer: Self-pay

## 2020-12-05 ENCOUNTER — Encounter: Payer: Medicaid Other | Admitting: Internal Medicine

## 2020-12-05 ENCOUNTER — Telehealth: Payer: Self-pay | Admitting: *Deleted

## 2020-12-05 NOTE — Telephone Encounter (Signed)
Pt complaining of rectal bleeding and has hx of Hemorrhoids and anal fissure. Pt scheduled to see Carl Best 12/20/20 at 1:30. Pt made aware.

## 2020-12-05 NOTE — Telephone Encounter (Signed)
F/U call - stated the last time she went to the ED, wait time was 8 hrs. Suggested going to ED at the [facility] on [address]. Stated she will go there or [facility].

## 2020-12-05 NOTE — Telephone Encounter (Signed)
After talking with Dr Kayren Eaves, recommend pt go to the ER. Called pt back, explained to pt with her symptoms and hx and after talking with the doctor, she needs to go to the ER. She stated "ok". 2 PM appt for this afternoon has been canceled.

## 2020-12-05 NOTE — Telephone Encounter (Addendum)
Appointment Request From: [name]   With Provider: Jose Persia, MD [facility]]   Preferred Date Range: 12/07/2020 - 12/09/2020   Preferred Times: Any Time   Reason for visit: Office Visit   Comments: I've been having migraines for five weeks it leaves n come strong at night at the same time every night 11:30pm my chest has been bothering me I've checked my blood sugar and blood pressure bother are great need to be checked for UTI or bacterial infection or yeast infection    I called pt per front office request. Pt stated she has an UTI b/c she's having urinary frequency, an odor and it's cloudy. She' having migraines x 5 weeks; OTC meds are not helping. Also having rectal bleeding/hemorrhoids- stated she has already called her GI doctor. Also c/o lower back / chest pain since Tuesday of last week, comes and goes- stated she's taking Eliquis(hx of PE).  C/o sob when "walking around" . Then stated she having fatigue, dizziness. And having episodes of squeezing in her brain and chest; "taking the breath of me" ; had 2 episodes this month; last about 20 seconds.  Stated her BP and BS are ok. I scheduled her an appt to be seen this afternoon 2PM but I will discuss this  with The Attending. Stated she did not want to the ER and wait for 8 hrs.

## 2020-12-06 ENCOUNTER — Other Ambulatory Visit: Payer: Self-pay

## 2020-12-06 ENCOUNTER — Ambulatory Visit (HOSPITAL_COMMUNITY)
Admission: EM | Admit: 2020-12-06 | Discharge: 2020-12-06 | Disposition: A | Discharge status: Home / Self Care | Payer: Medicaid Other | Attending: Family Medicine | Admitting: Family Medicine

## 2020-12-06 ENCOUNTER — Encounter (HOSPITAL_COMMUNITY): Payer: Self-pay

## 2020-12-06 DIAGNOSIS — L03011 Cellulitis of right finger: Secondary | ICD-10-CM

## 2020-12-06 DIAGNOSIS — G43701 Chronic migraine without aura, not intractable, with status migrainosus: Secondary | ICD-10-CM

## 2020-12-06 MED ORDER — SUMATRIPTAN SUCCINATE 6 MG/0.5ML ~~LOC~~ SOLN
6.0000 mg | Freq: Once | SUBCUTANEOUS | Status: AC
  Administered 2020-12-06: 6 mg via SUBCUTANEOUS

## 2020-12-06 MED ORDER — SUMATRIPTAN SUCCINATE 6 MG/0.5ML ~~LOC~~ SOLN
SUBCUTANEOUS | Status: AC
  Filled 2020-12-06: qty 0.5

## 2020-12-06 NOTE — ED Provider Notes (Signed)
[place]    CSN: [id number] Arrival date & time: 12/06/20  1709      History   Chief Complaint Chief Complaint  Patient presents with   Headache    HPI Chelcea LAMERLE JABS is a 40 y.o. female.   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 No changes in vision currently  Adamantly denies chest pain and shortness of breath today Reports compliance with her eliquis for PE diagnosed in April  Right Second Finger Swelling 2 days ago, had a hang nail and noticed that after pulling it she has had pain and swelling near her nail No fevers Has not drained Felt like it was more painful and swollen yesterday, can move finger better today    Past Medical History:  Diagnosis Date   Anxiety    Arthritis    Asthma    Bronchitis    BV (bacterial vaginosis)    Candidiasis    Depression    Diabetes mellitus without complication ([place])    Endometriosis    Headache(784.0)    migraines   Hypertension    no longer takes meds   Migraines    Muscle spasm    Ovarian cyst    Pelvic pain in female 09/29/2015   Scoliosis    Uterine fibroid     Patient Active Problem List   Diagnosis Date Noted   Mild concentric left ventricular hypertrophy (LVH) 08/12/2020   Sessile colonic polyp 08/12/2020   Abnormal colonoscopy 08/12/2020   Thrombocytosis 08/12/2020   Iron deficiency anemia due to chronic blood loss    Symptomatic anemia    Unintentional weight loss 08/09/2020   Bilateral pulmonary embolism ([place]) 08/08/2020   Thrush 06/20/2020   Craving for particular food 06/20/2020   Dry cough 06/20/2020   Epigastric abdominal pain 05/27/2020    Dysuria 05/27/2020   Fibroids 05/05/2020   Melena 04/08/2020   Iron deficiency 04/08/2020   Severe episode of recurrent major depressive disorder, with psychotic features ([place]) 03/11/2020   Generalized anxiety disorder 03/11/2020   Type 2 diabetes mellitus without complication, without long-term current use of insulin ([place]) 12/09/2019   Abnormal uterine bleeding (AUB) 02/20/2019   History of bilateral tubal ligation 02/20/2019   Carpal tunnel syndrome, bilateral 01/16/2019   Hypertension 07/21/2018   Endometriosis 08/30/2017   Pelvic pain 09/29/2015    Past Surgical History:  Procedure Laterality Date   BIOPSY  08/12/2020   Procedure: BIOPSY;  Surgeon: Mauri Pole, MD;  Location: [facility];  Service: Endoscopy;;   CHOLECYSTECTOMY     COLONOSCOPY WITH PROPOFOL N/A 08/12/2020   Procedure: COLONOSCOPY WITH PROPOFOL;  Surgeon: Mauri Pole, MD;  Location: [facility];  Service: Endoscopy;  Laterality: N/A;   ESOPHAGOGASTRODUODENOSCOPY (EGD) WITH PROPOFOL N/A 08/12/2020   Procedure: ESOPHAGOGASTRODUODENOSCOPY (EGD) WITH PROPOFOL;  Surgeon: Mauri Pole, MD;  Location: [facility];  Service: Endoscopy;  Laterality: N/A;   POLYPECTOMY  08/12/2020   Procedure: POLYPECTOMY;  Surgeon: Mauri Pole, MD;  Location: [facility];  Service: Endoscopy;;   TUBAL LIGATION      OB History     Gravida  4   Para  3   Term  3   Preterm  0   AB  1   Living  3      SAB  1   IAB  0   Ectopic  0   Multiple  0   Live Births  3        Obstetric Comments  SVD x 3          Home Medications    Prior to Admission medications   Medication Sig Start Date End Date Taking? Authorizing Provider  acetaminophen (TYLENOL) 500 MG tablet Take 2 tablets (1,000 mg total) by mouth every 6 (six) hours as needed for mild pain (or Fever >/= 101). 08/12/20   Christian, [name], MD  albuterol (PROVENTIL HFA;VENTOLIN HFA) 108 (90 Base) MCG/ACT inhaler Inhale 2 puffs  into the lungs every 6 (six) hours as needed for wheezing or shortness of breath. Reported on 09/29/2015    [provider]  albuterol (PROVENTIL) (2.5 MG/3ML) 0.083% nebulizer solution Take 3 mLs (2.5 mg total) by nebulization every 6 (six) hours as needed for wheezing or shortness of breath. 07/02/18   Wurst, [place], PA-C  apixaban (ELIQUIS) 5 MG TABS tablet Take 1 tablet (5 mg ) twice a day. Get Refills at [facility] 09/07/20   Asencion Noble, MD  ARIPiprazole (ABILIFY) 10 MG tablet Take 1 tablet (10 mg total) by mouth daily. 10/11/20   Salley Slaughter, NP  artificial tears (LACRILUBE) OINT ophthalmic ointment Place into both eyes at bedtime as needed for dry eyes. 05/02/18   Langston Masker B, PA-C  busPIRone (BUSPAR) 10 MG tablet Take 1 tablet (10 mg total) by mouth 3 (three) times daily. 10/11/20   Salley Slaughter, NP  dicyclomine (BENTYL) 10 MG capsule Take 1 capsule (10 mg total) by mouth 4 (four) times daily as needed for spasms (abdominal pain). 06/20/20   Jose Persia, MD  Exenatide ER 2 MG/0.85ML AUIJ INJECT 2 MG INTO THE SKIN ONCE A WEEK. 07/19/20 07/19/21  Sanjuan Dame, MD  fluticasone (FLONASE) 50 MCG/ACT nasal spray PLACE 1 SPRAY INTO BOTH NOSTRILS IN THE MORNING AND AT BEDTIME. 06/20/20 06/20/21  Jose Persia, MD  glucose blood test strip Check your sugar in the morning before you eat breakfast, and one hour after a meal. 11/12/19   Melynda Ripple, MD  glucose monitoring kit (FREESTYLE) monitoring kit 1 each by Does not apply route daily. Check glucose once in the morning before breakfast and 1 hour after a meal 11/12/19   Melynda Ripple, MD  guaifenesin (ROBITUSSIN) 100 MG/5ML syrup Take 200 mg by mouth 3 (three) times daily as needed for cough.    [provider]  hydrOXYzine (ATARAX/VISTARIL) 25 MG tablet Take 1 tablet (25 mg total) by mouth 3 (three) times daily as needed. 10/11/20   Salley Slaughter, NP  insulin  aspart (NOVOLOG) 100 UNIT/ML FlexPen Inject 10 Units into the skin 2 (two) times daily with a meal. 06/03/20   Jose Persia, MD  insulin glargine (LANTUS) 100 UNIT/ML Solostar Pen INJECT 20 UNITS INTO THE SKIN DAILY AT 10 PM. 06/03/20 06/03/21  Jose Persia, MD  Insulin Pen Needle (UNIFINE PEN NEEDLES) 32G X 4 MM MISC 1 application by Does not apply route in the morning, at noon, and at bedtime. 02/19/20   [place], Carley D, DO  Insulin Pen Needle 32G X 4 MM MISC USE AS DIRECTED IN THE MORNING, NOON AND AT BEDTIME. 02/19/20 02/18/21  [place]omfield, Carley D, DO  meclizine (ANTIVERT) 25  MG tablet Take 1 tablet (25 mg total) by mouth 3 (three) times daily as needed for dizziness. 05/06/18   Melynda Ripple, MD  metFORMIN (GLUCOPHAGE) 1000 MG tablet Take 1 tablet (1,000 mg total) by mouth 2 (two) times daily with a meal. 12/09/19 12/08/20  Masoudi, Dorthula Rue, MD  mirtazapine (REMERON) 30 MG tablet TAKE 1 TABLET (30 MG TOTAL) BY MOUTH AT BEDTIME. 10/11/20 10/11/21  Eulis Canner E, NP  nystatin (MYCOSTATIN) 100000 UNIT/ML suspension TAKE 5 MLS BY MOUTH FOUR TIMES DAILY FOR 7 DAYS. 06/20/20 06/20/21  Jose Persia, MD  oxyCODONE (OXY IR/ROXICODONE) 5 MG immediate release tablet Take 1 tablet (5 mg total) by mouth 2 (two) times daily as needed for severe pain. 09/02/20   Axel Filler, MD  pantoprazole (PROTONIX) 40 MG tablet Take 1 tablet (40 mg total) by mouth daily. 08/12/20   Mitzi Hansen, MD  Polysaccharide Iron Complex 150 MG TABS Take 150 mg by mouth daily. 04/07/20   Sanjuan Dame, MD    Family History Family History  Problem Relation Age of Onset   Cancer Father    Hypertension Maternal Aunt    Breast cancer Maternal Aunt    Cancer Paternal Grandmother    Breast cancer Paternal Grandmother    Breast cancer Paternal Aunt     Social History Social History   Tobacco Use   Smoking status: Former   Smokeless tobacco: Never  Scientific laboratory technician Use: Never used   Substance Use Topics   Alcohol use: Not Currently   Drug use: Yes    Frequency: 1.0 times per week    Types: Marijuana     Allergies   Mushroom extract complex, Latex, and Penicillins   Review of Systems Review of Systems   Physical Exam Triage Vital Signs ED Triage Vitals [12/06/20 1728]  Enc Vitals Group     BP 117/76     Pulse Rate (!) 102     Resp 18     Temp 98.9 F (37.2 C)     Temp Source Oral     SpO2 100 %     Weight      Height      Head Circumference      Peak Flow      Pain Score      Pain Loc      Pain Edu?      Excl. in [place]?    No data found.  Updated Vital Signs BP 117/76 (BP Location: Right Arm)   Pulse (!) 102   Temp 98.9 F (37.2 C) (Oral)   Resp 18   SpO2 100%   Visual Acuity Right Eye Distance:   Left Eye Distance:   Bilateral Distance:    Right Eye Near:   Left Eye Near:    Bilateral Near:     Physical Exam Constitutional:      General: She is not in acute distress.    Appearance: She is well-developed. She is not ill-appearing or toxic-appearing.  HENT:     Head: Normocephalic and atraumatic.  Eyes:     General: No visual field deficit or scleral icterus.    Extraocular Movements: Extraocular movements intact.     Right eye: Normal extraocular motion.     Left eye: Normal extraocular motion.     Pupils: Pupils are equal, round, and reactive to light. Pupils are equal.  Cardiovascular:     Rate and Rhythm: Normal rate and regular rhythm.     Comments:  Rate 98 on exam Pulmonary:     Effort: Pulmonary effort is normal.     Breath sounds: Normal breath sounds. No wheezing, rhonchi or rales.  Musculoskeletal:       Hands:     Cervical back: Normal range of motion and neck supple. No rigidity.  Skin:    General: Skin is warm and dry.     Capillary Refill: Capillary refill takes less than 2 seconds.  Neurological:     Mental Status: She is alert and oriented to person, place, and time. Mental status is at baseline.      Cranial Nerves: No cranial nerve deficit, dysarthria or facial asymmetry.     Sensory: No sensory deficit.     Motor: No weakness.     Gait: Gait normal.  Psychiatric:        Mood and Affect: Mood normal.        Speech: Speech normal.        Behavior: Behavior normal.     UC Treatments / Results  Labs (all labs ordered are listed, but only abnormal results are displayed) Labs Reviewed - No data to display  EKG   Radiology No results found.  Procedures Incision and Drainage  Date/Time: 12/06/2020 6:02 PM Performed by: Cleophas Dunker, DO Authorized by: Chase Picket, MD   Consent:    Consent obtained:  Verbal   Consent given by:  Patient   Risks, benefits, and alternatives were discussed: yes     Risks discussed:  Incomplete drainage, pain, bleeding and infection Universal protocol:    Procedure explained and questions answered to patient or proxy's satisfaction: yes     Immediately prior to procedure, a time out was called: yes     Patient identity confirmed:  Verbally with patient and arm band Location:    Indications for incision and drainage: paronychia.   Size:  28m   Location:  Upper extremity   Upper extremity location:  Finger   Finger location:  R index finger Pre-procedure details:    Skin preparation:  Povidone-iodine Anesthesia:    Anesthesia method: ethyl chloride. Procedure details:    Incision types:  Stab incision   Incision depth:  Dermal   Drainage:  Purulent   Drainage amount:  Moderate   Wound treatment:  Wound left open   Packing materials:  None Post-procedure details:    Procedure completion:  Tolerated (including critical care time)  Medications Ordered in UC Medications  SUMAtriptan (IMITREX) injection 6 mg (6 mg Subcutaneous Given 12/06/20 1801)    Initial Impression / Assessment and Plan / UC Course  I have reviewed the triage vital signs and the nursing notes.  Pertinent labs & imaging results that were available  during my care of the patient were reviewed by me and considered in my medical decision making (see chart for details).     Patient is a 40year old female with past medical history significant for bilateral pulmonary embolism diagnosed in April on Eliquis, depression, type 2 diabetes, hypertension, migraines, who presents with a migraine headache that has been occurring off and on for the last 7 weeks without improvement.  She also reports swelling of her right index finger which is significant with a paronychia.  She does not have any systemic systemic symptoms of infection.  She did call her PCP yesterday per chart review and noted to have chest pain and dyspnea on exertion, was advised to go to the ED.  Patient adamantly denies the symptoms  today and initial tachycardia improved from 10 2-98.  She reports compliance with her Eliquis.  Advised that if she again experiences chest pain dyspnea on exertion to go to the ED right away.  Also advised follow-up with her PCP in regards to her chronic medical problems.  For her migraine, she has neurologically intact on exam and this is not a new headache type for her, therefore low risk.  She is on Eliquis therefore cannot use NSAIDs and she also had a prior prolonged QTC on last EKG, therefore we will treat her with sumatriptan only, no antiemetic.  She will follow-up with PCP for further management of migraine.  Advised if she has significant worsening of her symptoms, especially if she experiences changes in consciousness, focal weakness, numbness and tingling, difficulty speaking, facial droop, to go to the emergency room right away.  She was understanding.  Her paronychia was incised and drained per above, she tolerated procedure well.  She had a mild to moderate amount of drainage, no significant surrounding erythema.  We will leave open and have a continue to drain.  She can use topical antibiotic ointment.  No need for oral antibiotics at this time.  She will  follow-up with her PCP if pain and swelling recur.  She was discharged home in stable condition.  Given her need to use hands during work, will write her out for 6 days.   Final Clinical Impressions(s) / UC Diagnoses   Final diagnoses:  Paronychia of finger of right hand  Chronic migraine without aura with status migrainosus, not intractable     Discharge Instructions      We drained the abscess in your finger.  You should keep this clean with warm soapy water and covered.  You should also use antibiotic ointment on this.  If you have significant worsening of your symptoms or if this comes back, you should be seen by your regular doctor.  If you develop fever, go to the emergency room.  For your headache, we did give you a medication that helps with migraines.  If this does not help with your pain, especially if you have significant worsening in your symptoms, you should be seen by Dr. right away.  If your headache becomes very severe, you are unable to eat or drink, you have weakness on one side of your body or the other, numbness and tingling in your extremities, difficulty speaking, or facial muscles droop, he should go to the emergency room right away.  If you experience chest pain or difficulty breathing, you should go to the emergency room.     ED Prescriptions   None    PDMP not reviewed this encounter.   Cleophas Dunker, DO 12/06/20 1814

## 2020-12-06 NOTE — Discharge Instructions (Addendum)
We drained the abscess in your finger.  You should keep this clean with warm soapy water and covered.  You should also use antibiotic ointment on this.  If you have significant worsening of your symptoms or if this comes back, you should be seen by your regular doctor.  If you develop fever, go to the emergency room.  For your headache, we did give you a medication that helps with migraines.  If this does not help with your pain, especially if you have significant worsening in your symptoms, you should be seen by Dr. right away.  If your headache becomes very severe, you are unable to eat or drink, you have weakness on one side of your body or the other, numbness and tingling in your extremities, difficulty speaking, or facial muscles droop, he should go to the emergency room right away.  If you experience chest pain or difficulty breathing, you should go to the emergency room.

## 2020-12-06 NOTE — ED Triage Notes (Signed)
Pt c/o migraine going on 7 weeks stating she has taken "everything possible over the counter" without any relief. States she has only had 1 migraine before a few years ago and this UC gave her "a shot" that did give relief.   Also c/o right pointer finger "hangnail infection." Finger tip does appear to have a raised round area with some discoloration.

## 2020-12-08 ENCOUNTER — Other Ambulatory Visit: Payer: Self-pay

## 2020-12-08 ENCOUNTER — Telehealth: Payer: Self-pay | Admitting: *Deleted

## 2020-12-08 ENCOUNTER — Other Ambulatory Visit: Payer: Self-pay | Admitting: Internal Medicine

## 2020-12-08 NOTE — Telephone Encounter (Signed)
Patient called in c/o "excruciating" h/a. Taking Aleve, ibuprofen, Tylenol, and "BP" med without relief. Seen in ED 2 days ago for migraines and told to f/u with PCP. Patient given first available appt with Red Team tomorrow AM. Patient would like to know what she can do for the h/a in the meantime. Please advise.

## 2020-12-08 NOTE — Telephone Encounter (Signed)
I spoke with patient about her symptoms. Will hold off on treatment until further discussions tomorrow.

## 2020-12-09 ENCOUNTER — Ambulatory Visit (INDEPENDENT_AMBULATORY_CARE_PROVIDER_SITE_OTHER): Payer: Self-pay | Admitting: Internal Medicine

## 2020-12-09 ENCOUNTER — Encounter: Payer: Self-pay | Admitting: Internal Medicine

## 2020-12-09 VITALS — BP 116/76 | HR 73 | Wt 195.5 lb

## 2020-12-09 DIAGNOSIS — E119 Type 2 diabetes mellitus without complications: Secondary | ICD-10-CM

## 2020-12-09 DIAGNOSIS — N939 Abnormal uterine and vaginal bleeding, unspecified: Secondary | ICD-10-CM

## 2020-12-09 DIAGNOSIS — N83299 Other ovarian cyst, unspecified side: Secondary | ICD-10-CM

## 2020-12-09 DIAGNOSIS — G43909 Migraine, unspecified, not intractable, without status migrainosus: Secondary | ICD-10-CM | POA: Insufficient documentation

## 2020-12-09 DIAGNOSIS — K625 Hemorrhage of anus and rectum: Secondary | ICD-10-CM

## 2020-12-09 DIAGNOSIS — G43009 Migraine without aura, not intractable, without status migrainosus: Secondary | ICD-10-CM

## 2020-12-09 DIAGNOSIS — D509 Iron deficiency anemia, unspecified: Secondary | ICD-10-CM

## 2020-12-09 DIAGNOSIS — K5900 Constipation, unspecified: Secondary | ICD-10-CM

## 2020-12-09 HISTORY — DX: Hemorrhage of anus and rectum: K62.5

## 2020-12-09 LAB — POC HEMOCCULT BLD/STL (OFFICE/1-CARD/DIAGNOSTIC)
Card #1 Date: 8192022
Fecal Occult Blood, POC: POSITIVE — AB

## 2020-12-09 NOTE — Patient Instructions (Signed)
Thank you, Ms.[name] for allowing [place] to provide your care today. Today we discussed rectal bleeding.    Labs Ordered:  Lab Orders         Anemia Profile B      Tests Ordered: Orders Placed This Encounter  Procedures   Anemia Profile B      Referrals Ordered:  Referral Orders  No referral(s) requested today     Medication Changes:  There are no discontinued medications.   No orders of the defined types were placed in this encounter.    Health Maintenance Screening: Diabetes Health Maintenance Due  Topic Date Due   FOOT EXAM  Never done   OPHTHALMOLOGY EXAM  Never done   HEMOGLOBIN A1C  11/22/2020   URINE MICROALBUMIN  07/19/2021     Instructions:  - Please hold off on taking your eliquis. - please restart your iron pills - I will refill your medications today   Follow up:  on Wednesday    Remember: If you have any questions or concerns, call our clinic at [phone] or after hours call [phone] and ask for the internal medicine resident on call.  Marianna Payment, D.O. [place]

## 2020-12-09 NOTE — Assessment & Plan Note (Addendum)
Assessment: Patient presents for further evaluation and management of rectal bleeding. Patient states that she has a significant history of rectal bleeding with a recent admission on [id number] for PE complicated by rectal bleeding. Colonoscopy was performed at that time which showed internal hemorrhoids and precancerous polyps removed, but complete visualization was difficult to insufficient prep. She was discharged on Eliquis and endorses taking this medication as prescribed. Patient states that since then she has had intermittent episodes of minimal BRBPR. Specifically, she notes minimal blood surrounding her stool and on the toilet paper. She believes that this has progressed over the past two weeks and has been associated with inconsistent BM, straining with BMs and hard/pebble-like stools. She endorses rectal pain, which prompted her to perform self exam and was able to visualize an irregular flap of tissue perirectally. Otherwise, she denies any abdominal pain, nausea, vomiting, melena, hematemesis. She does have a history of vaginal bleeding and reports having 3 episodes of vaginal bleeding over the past month, which is irregular for her.   Rectal exam was performed today without perirectal pain or obvious perirectal/anal masses, hemorrhoids, or fissures. No stool was palpable in the rectal vault. Stool was brown with minimal spec of BRB on glove. FOBT was positive. Abd exam was WNL.   Plan: - Patient has a follow up appointment with GI on Monday (08/22).  - Will perform CBC, ferritin, and iron studies. - Will get patient close follow up next week.  - Will hold eliquis until her follow up with GI/Int Med Clinic

## 2020-12-09 NOTE — Assessment & Plan Note (Addendum)
Assessment: Patient has a history of iron deficiency and reports chewing tissues and eating ice in the last few weeks. She also endorses feeling cold and fatigued, but denies weakness, palpitation, SHOB, or presyncopal sx. She was previously taking iron supplements but stopped due to the size of the pills making it difficult to swallow. She is currently on Eliquis for PE with a recent history of minimal BRBPR and 3x episodes of vaginal bleeding. She has GI follow-up on 8/22.   Plan: Although she is having some symptoms of anemia, she does not clinically appear to need emergent admission for PRBC transfusion. We will plan to order a CBC to check Hgb and iron studies. We discussed that she may need to return to be admitted or receive a blood infusion if her Hgb is dangerously low. We instructed her to stop taking her Eliquis until follow up early next week.  -Stat CBC, iron studies pending -Restart oral iron supplementations. Consider liquid iron -Hold Eliquis -Follow-up next week    **ADDENDUM**  CBC and iron labs consistent with iron deficiency anemia. Hgb consistent with previous CBC. Ferritin and reticulocyte count pending. Given history of HFmrEF, she may benefit form IV iron. No emergent need for admission. Continue oral supplementation.   Hemoglobin  Date Value Ref Range Status  12/09/2020 9.1 (L) 11.1 - 15.9 g/dL Final   Iron/TIBC/Ferritin/ %Sat    Component Value Date/Time   IRON 37 12/09/2020 1211   TIBC 448 12/09/2020 1211   FERRITIN WILL FOLLOW 12/09/2020 1211   IRONPCTSAT 8 (LL) 12/09/2020 1211

## 2020-12-09 NOTE — Assessment & Plan Note (Addendum)
Patient's most recent A1c was 10.8, which she attributes to difficulty accessing affordable medications. She states that she is rationing her insulin and was unable to get her GLP-1 due to cost. She previously had insurance through family planning, but this has since stopped. She does report taking her metformin regularly. Since she was here today for an acute visit of headache, we were not able to focus much on her diabetes and she needs close follow-up. We will re-send her prescriptions with [facility] Program so she can resume taking them as instructed.   Plan:  -GLP-1 resent with [facility] Program designation  -Insulin Rx sent -Check A1c at next visit  -Follow-up next week  **ADDENDUM**  Exenatide was not covered at the pharmacy. Therefore, Liraglutide was substituted. Her last was drawn 6 months ago and was 10.8 and her BMI is 34.6.

## 2020-12-09 NOTE — Assessment & Plan Note (Addendum)
Assessment: Patient reports a daily throbbing, bilateral headache for the past 7 weeks. It typically will occur in the morning and lasts 1-1.5 hours, then it returns nightly around 11 PM. She rates the nightly headache as 10/10 pain. Last year, she had a similar occurrence in August which lasted for 5 weeks. The headaches are accompanied by photophobia, sound sensitivity, and blurred vision, but without true aura. Occasionally, she will experience sweats, chills, tearing in both eyes (right worse than left), left sided toothache, and left sided facial swelling, but it is difficult to determine if these symptoms are completely associated with her headaches. Recently, she experience paresthesias in her bilateral upper extremities that she is concerned are associated with her headaches. She denies nausea, vomiting, dizziness. Over the past 7 weeks she has been consistently taking extra strength tylenol, aleve, and Excedrin which inconsistently improves her symptoms. Several years ago, she reports receiving a Toradol injection which relieved the headache. Additionally, she received Imitrex at a recent urgent care visit on 8/16 which helped for 8-9 hours but now her headache is back. Improtant to note, that patient has a significant behavioral health history and recently has been under a lot of stress. She notes the loss several people close to her over the past year and believe sthe stress may be exacerbating her symptoms. Specifically, she does have a history of schizophrenia with visual and tactile hallucinations, which could be confounding her symptoms.  Overall, neurovascular exam was grossly normal.  Patient likely suffering rebound headaches from medication overuse but cannot exclude atypical presentation of migraine versus cluster headaches. Considering she has some improvement of her symptoms with sumatriptan, I will prescribe her this medication. We discussed the importance of not taking NSAIDs since she  is at higher risk for bleeding. She would benefit from imaging such as an MRI, but she currently does not have any insurance coverage.   Plan:  - Counseled regarding d/c NSAID medications -Start Sumatriptan '50mg'$  PRN migraines  - Follow-up next week

## 2020-12-09 NOTE — Progress Notes (Addendum)
   CC: My head is hurting   HPI:   [name] is a 40 y.o. female with a pertinent medical history of recent pulmonary embolism currently on anticoagulant therapy, depression with psychotic features, type 2 diabetes mellitus, hemorrhoids, endometriosis, and ovarian cyst and a strong family history of malignancy (colon, breast, ovarian cancer) who presents for acute concern of headache. She also endorses other symptoms including 2 weeks of bright red blood in her stool and several weeks of chewing tissues and eating ice.   Please see problem-based charting for details of this encounter and assessment & plan.   Review of Systems  Constitutional:  Positive for malaise/fatigue. Negative for fever.  HENT:  Positive for congestion and tinnitus.   Eyes:  Positive for blurred vision.  Respiratory:  Positive for shortness of breath. Negative for cough.   Cardiovascular:  Negative for chest pain and leg swelling.  Gastrointestinal:  Positive for blood in stool and constipation. Negative for melena, nausea and vomiting.  Genitourinary:  Positive for frequency. Negative for dysuria and hematuria.  Neurological:  Positive for headaches. Negative for focal weakness, seizures and weakness.  Psychiatric/Behavioral:  Positive for depression and substance abuse. The patient has insomnia.     Physical Exam:  Vitals:   12/09/20 1017  BP: 116/76  Pulse: 73  SpO2: 100%  Weight: 195 lb 8 oz (88.7 kg)   Physical Exam Vitals reviewed. Exam conducted with a chaperone present.  Eyes:     Extraocular Movements: Extraocular movements intact.     Pupils: Pupils are equal, round, and reactive to light.  Cardiovascular:     Rate and Rhythm: Normal rate and regular rhythm.  Pulmonary:     Effort: Pulmonary effort is normal.     Breath sounds: Normal breath sounds.  Genitourinary:    Rectum: No mass or external hemorrhoid.     Comments: A few bright red blood drops present on gloved digit when  rectal exam performed.  Musculoskeletal:        General: Normal range of motion.     Cervical back: Normal range of motion.  Skin:    General: Skin is warm.  Neurological:     Mental Status: She is alert.     Cranial Nerves: No cranial nerve deficit.     Motor: No weakness.  Psychiatric:        Mood and Affect: Mood is depressed.     Assessment & Plan:   See Encounters Tab for problem based charting.  Patient discussed with Dr. Dareen Piano.  Sabino Dick, MS3

## 2020-12-10 ENCOUNTER — Other Ambulatory Visit (HOSPITAL_COMMUNITY): Payer: Self-pay

## 2020-12-10 ENCOUNTER — Encounter: Payer: Self-pay | Admitting: Internal Medicine

## 2020-12-10 DIAGNOSIS — K5900 Constipation, unspecified: Secondary | ICD-10-CM | POA: Insufficient documentation

## 2020-12-10 DIAGNOSIS — N83299 Other ovarian cyst, unspecified side: Secondary | ICD-10-CM | POA: Insufficient documentation

## 2020-12-10 HISTORY — DX: Constipation, unspecified: K59.00

## 2020-12-10 LAB — ANEMIA PROFILE B
Basophils Absolute: 0 10*3/uL (ref 0.0–0.2)
Basos: 0 %
EOS (ABSOLUTE): 0.2 10*3/uL (ref 0.0–0.4)
Eos: 2 %
Ferritin: 8 ng/mL — ABNORMAL LOW (ref 15–150)
Folate: 6.6 ng/mL (ref >3.0)
Hematocrit: 32 % — ABNORMAL LOW (ref 34.0–46.6)
Hemoglobin: 9.1 g/dL — ABNORMAL LOW (ref 11.1–15.9)
Immature Grans (Abs): 0 10*3/uL (ref 0.0–0.1)
Immature Granulocytes: 0 %
Iron Saturation: 8 % — CL (ref 15–55)
Iron: 37 ug/dL (ref 27–159)
Lymphocytes Absolute: 3 10*3/uL (ref 0.7–3.1)
Lymphs: 29 %
MCH: 21.2 pg — ABNORMAL LOW (ref 26.6–33.0)
MCHC: 28.4 g/dL — ABNORMAL LOW (ref 31.5–35.7)
MCV: 74 fL — ABNORMAL LOW (ref 79–97)
Monocytes Absolute: 0.6 10*3/uL (ref 0.1–0.9)
Monocytes: 6 %
Neutrophils Absolute: 6.5 10*3/uL (ref 1.4–7.0)
Neutrophils: 63 %
Platelets: 631 10*3/uL — ABNORMAL HIGH (ref 150–450)
RBC: 4.3 x10E6/uL (ref 3.77–5.28)
RDW: 15.1 % (ref 11.7–15.4)
Retic Ct Pct: 1 % (ref 0.6–2.6)
Total Iron Binding Capacity: 448 ug/dL (ref 250–450)
UIBC: 411 ug/dL (ref 131–425)
Vitamin B-12: 365 pg/mL (ref 232–1245)
WBC: 10.2 10*3/uL (ref 3.4–10.8)

## 2020-12-10 MED ORDER — METFORMIN HCL 1000 MG PO TABS
1000.0000 mg | ORAL_TABLET | Freq: Two times a day (BID) | ORAL | 3 refills | Status: DC
  Filled 2020-12-10: qty 60, 30d supply, fill #0
  Filled 2021-06-14: qty 90, 45d supply, fill #0

## 2020-12-10 MED ORDER — INSULIN GLARGINE 100 UNIT/ML SOLOSTAR PEN
20.0000 [IU] | PEN_INJECTOR | Freq: Every day | SUBCUTANEOUS | 3 refills | Status: DC
  Filled 2020-12-10: qty 6, 30d supply, fill #0

## 2020-12-10 MED ORDER — SUMATRIPTAN SUCCINATE 50 MG PO TABS
50.0000 mg | ORAL_TABLET | ORAL | 0 refills | Status: DC | PRN
  Filled 2020-12-10: qty 15, 30d supply, fill #0
  Filled 2020-12-12 – 2021-03-07 (×3): qty 9, 30d supply, fill #0
  Filled 2021-03-07: qty 15, 2d supply, fill #0
  Filled 2021-06-14: qty 9, 30d supply, fill #0

## 2020-12-10 MED ORDER — INSULIN ASPART 100 UNIT/ML FLEXPEN
10.0000 [IU] | PEN_INJECTOR | Freq: Two times a day (BID) | SUBCUTANEOUS | 2 refills | Status: DC
  Filled 2020-12-10: qty 6, 30d supply, fill #0

## 2020-12-10 MED ORDER — EXENATIDE ER 2 MG/0.85ML ~~LOC~~ AUIJ
2.0000 mg | AUTO-INJECTOR | SUBCUTANEOUS | 0 refills | Status: DC
  Filled 2020-12-10: qty 3.4, 28d supply, fill #0

## 2020-12-10 MED ORDER — INSULIN PEN NEEDLE 32G X 4 MM MISC
1.0000 "application " | Freq: Three times a day (TID) | 1 refills | Status: DC
  Filled 2020-12-10: qty 100, 25d supply, fill #0

## 2020-12-10 NOTE — Assessment & Plan Note (Signed)
Patient presents with a history of at least 3 episodes of vaginal bleeding over the past month, which she states is abnormal for her. Although, I was unable to obtain a full GYN history and exam due alternative acute concerns, she apparent has a history of at least one pregnancy with vaginal delivery. Unknown sexual history but does report concern regarding ectopic pregnancy despite history of tubal ligation. She had similar concern at her previous visit, with a negative pregnancy test. Additionally, she was previously taking Depo injections, but I am unsure for what indications (vaginal bleeding, endometriosis, birth control, etc.) and if she is still taking this medication. She states that she is no longer following up with an OBGYN at this time. She does have a previous TVUS (2021) showing uterine fibroids as well as a 1.7 cm complex ovarian cyst. I do not see any follow up imaging to ensure that the cyst has not grown.Patients last documented pelvic exam was performed in 2020 for vaginal pain concerning for endometriosis but unsure if patient had follow up regarding this issue.    Plan: - Will need pelvic exam at follow up - Repeat TVUS - Additional history will be needed

## 2020-12-10 NOTE — Assessment & Plan Note (Signed)
Patient presents with s/sx of constipation. No signs of obstructions.   Plan: - Recommended fiber and miralax

## 2020-12-10 NOTE — Progress Notes (Signed)
Attestation for Student Documentation:  I personally was present and performed or re-performed the history, physical exam and medical decision-making activities of this service and have verified that the service and findings are accurately documented in the student's note.  Marianna Payment, MD

## 2020-12-12 ENCOUNTER — Other Ambulatory Visit (HOSPITAL_COMMUNITY): Payer: Self-pay

## 2020-12-12 MED ORDER — LIRAGLUTIDE 18 MG/3ML ~~LOC~~ SOPN
PEN_INJECTOR | SUBCUTANEOUS | 3 refills | Status: DC
  Filled 2020-12-12 – 2021-06-14 (×2): qty 6, 30d supply, fill #0

## 2020-12-12 NOTE — Progress Notes (Signed)
Internal Medicine Clinic Attending  Case discussed with Dr. Coe  At the time of the visit.  We reviewed the resident's history and exam and pertinent patient test results.  I agree with the assessment, diagnosis, and plan of care documented in the resident's note.  

## 2020-12-12 NOTE — Addendum Note (Signed)
Addended by: Lawerance Cruel on: 12/12/2020 08:05 AM   Modules accepted: Orders

## 2020-12-14 ENCOUNTER — Other Ambulatory Visit: Payer: Self-pay

## 2020-12-14 ENCOUNTER — Emergency Department (HOSPITAL_COMMUNITY)
Admission: EM | Admit: 2020-12-14 | Discharge: 2020-12-14 | Disposition: A | Discharge status: Home / Self Care | Payer: Medicaid Other | Attending: Emergency Medicine | Admitting: Emergency Medicine

## 2020-12-14 ENCOUNTER — Ambulatory Visit (INDEPENDENT_AMBULATORY_CARE_PROVIDER_SITE_OTHER): Payer: Self-pay | Admitting: Student

## 2020-12-14 ENCOUNTER — Emergency Department (HOSPITAL_COMMUNITY): Payer: Medicaid Other

## 2020-12-14 ENCOUNTER — Encounter (HOSPITAL_COMMUNITY): Payer: Self-pay | Admitting: Emergency Medicine

## 2020-12-14 VITALS — BP 120/85 | HR 99 | Temp 98.7°F | Ht 63.0 in | Wt 194.5 lb

## 2020-12-14 DIAGNOSIS — E119 Type 2 diabetes mellitus without complications: Secondary | ICD-10-CM | POA: Insufficient documentation

## 2020-12-14 DIAGNOSIS — Z7901 Long term (current) use of anticoagulants: Secondary | ICD-10-CM | POA: Insufficient documentation

## 2020-12-14 DIAGNOSIS — J189 Pneumonia, unspecified organism: Secondary | ICD-10-CM

## 2020-12-14 DIAGNOSIS — F17290 Nicotine dependence, other tobacco product, uncomplicated: Secondary | ICD-10-CM | POA: Insufficient documentation

## 2020-12-14 DIAGNOSIS — Z20822 Contact with and (suspected) exposure to covid-19: Secondary | ICD-10-CM | POA: Insufficient documentation

## 2020-12-14 DIAGNOSIS — Z794 Long term (current) use of insulin: Secondary | ICD-10-CM | POA: Insufficient documentation

## 2020-12-14 DIAGNOSIS — J45909 Unspecified asthma, uncomplicated: Secondary | ICD-10-CM | POA: Insufficient documentation

## 2020-12-14 DIAGNOSIS — Z91040 Latex allergy status: Secondary | ICD-10-CM | POA: Insufficient documentation

## 2020-12-14 DIAGNOSIS — N9489 Other specified conditions associated with female genital organs and menstrual cycle: Secondary | ICD-10-CM | POA: Insufficient documentation

## 2020-12-14 DIAGNOSIS — Z7951 Long term (current) use of inhaled steroids: Secondary | ICD-10-CM | POA: Insufficient documentation

## 2020-12-14 DIAGNOSIS — K5900 Constipation, unspecified: Secondary | ICD-10-CM

## 2020-12-14 DIAGNOSIS — Z7984 Long term (current) use of oral hypoglycemic drugs: Secondary | ICD-10-CM | POA: Insufficient documentation

## 2020-12-14 DIAGNOSIS — Z86711 Personal history of pulmonary embolism: Secondary | ICD-10-CM

## 2020-12-14 DIAGNOSIS — I10 Essential (primary) hypertension: Secondary | ICD-10-CM | POA: Insufficient documentation

## 2020-12-14 DIAGNOSIS — K625 Hemorrhage of anus and rectum: Secondary | ICD-10-CM

## 2020-12-14 DIAGNOSIS — J181 Lobar pneumonia, unspecified organism: Secondary | ICD-10-CM | POA: Insufficient documentation

## 2020-12-14 DIAGNOSIS — R000 Tachycardia, unspecified: Secondary | ICD-10-CM | POA: Insufficient documentation

## 2020-12-14 DIAGNOSIS — Z79899 Other long term (current) drug therapy: Secondary | ICD-10-CM | POA: Insufficient documentation

## 2020-12-14 DIAGNOSIS — D509 Iron deficiency anemia, unspecified: Secondary | ICD-10-CM

## 2020-12-14 DIAGNOSIS — G43009 Migraine without aura, not intractable, without status migrainosus: Secondary | ICD-10-CM

## 2020-12-14 LAB — PROTIME-INR
INR: 1.1 (ref 0.8–1.2)
Prothrombin Time: 14 seconds (ref 11.4–15.2)

## 2020-12-14 LAB — CBC
HCT: 29.8 % — ABNORMAL LOW (ref 36.0–46.0)
Hemoglobin: 8.4 g/dL — ABNORMAL LOW (ref 12.0–15.0)
MCH: 21.7 pg — ABNORMAL LOW (ref 26.0–34.0)
MCHC: 28.2 g/dL — ABNORMAL LOW (ref 30.0–36.0)
MCV: 77 fL — ABNORMAL LOW (ref 80.0–100.0)
Platelets: 665 10*3/uL — ABNORMAL HIGH (ref 150–400)
RBC: 3.87 MIL/uL (ref 3.87–5.11)
RDW: 15.9 % — ABNORMAL HIGH (ref 11.5–15.5)
WBC: 11.1 10*3/uL — ABNORMAL HIGH (ref 4.0–10.5)
nRBC: 0 % (ref 0.0–0.2)

## 2020-12-14 LAB — BASIC METABOLIC PANEL
Anion gap: 8 (ref 5–15)
BUN: 10 mg/dL (ref 6–20)
CO2: 22 mmol/L (ref 22–32)
Calcium: 8.9 mg/dL (ref 8.9–10.3)
Chloride: 108 mmol/L (ref 98–111)
Creatinine, Ser: 0.74 mg/dL (ref 0.44–1.00)
GFR, Estimated: >60 mL/min (ref >60)
Glucose, Bld: 205 mg/dL — ABNORMAL HIGH (ref 70–99)
Potassium: 3.6 mmol/L (ref 3.5–5.1)
Sodium: 138 mmol/L (ref 135–145)

## 2020-12-14 LAB — TROPONIN I (HIGH SENSITIVITY)
Troponin I (High Sensitivity): 6 ng/L (ref <18)
Troponin I (High Sensitivity): 8 ng/L (ref <18)

## 2020-12-14 LAB — I-STAT BETA HCG BLOOD, ED (MC, WL, AP ONLY): I-stat hCG, quantitative: <5 m[IU]/mL (ref <5)

## 2020-12-14 LAB — POCT GLYCOSYLATED HEMOGLOBIN (HGB A1C): Hemoglobin A1C: 7.2 % — AB (ref 4.0–5.6)

## 2020-12-14 LAB — RESP PANEL BY RT-PCR (FLU A&B, COVID) ARPGX2
Influenza A by PCR: NEGATIVE
Influenza B by PCR: NEGATIVE
SARS Coronavirus 2 by RT PCR: NEGATIVE

## 2020-12-14 LAB — GLUCOSE, CAPILLARY: Glucose-Capillary: 99 mg/dL (ref 70–99)

## 2020-12-14 MED ORDER — FERROUS SULFATE 325 (65 FE) MG PO TABS
325.0000 mg | ORAL_TABLET | ORAL | 3 refills | Status: DC
Start: 2020-12-14 — End: 2022-02-28
  Filled 2020-12-14 – 2021-05-21 (×2): qty 30, 60d supply, fill #0
  Filled 2021-06-16: qty 15, 30d supply, fill #0

## 2020-12-14 MED ORDER — SENNOSIDES-DOCUSATE SODIUM 8.6-50 MG PO TABS
1.0000 | ORAL_TABLET | Freq: Every day | ORAL | 2 refills | Status: DC
  Filled 2020-12-14: qty 30, 30d supply, fill #0

## 2020-12-14 MED ORDER — AMOXICILLIN-POT CLAVULANATE 875-125 MG PO TABS
1.0000 | ORAL_TABLET | Freq: Two times a day (BID) | ORAL | 0 refills | Status: AC
  Filled 2020-12-14: qty 14, 7d supply, fill #0

## 2020-12-14 MED ORDER — SENNOSIDES-DOCUSATE SODIUM 8.6-50 MG PO TABS
1.0000 | ORAL_TABLET | Freq: Every day | ORAL | 2 refills | Status: DC
Start: 2020-12-14 — End: 2023-05-02
  Filled 2020-12-14: qty 90, 90d supply, fill #0

## 2020-12-14 MED ORDER — IOHEXOL 350 MG/ML SOLN
80.0000 mL | Freq: Once | INTRAVENOUS | Status: AC | PRN
  Administered 2020-12-14: 80 mL via INTRAVENOUS

## 2020-12-14 MED ORDER — AZITHROMYCIN 250 MG PO TABS
250.0000 mg | ORAL_TABLET | Freq: Every day | ORAL | 0 refills | Status: DC
  Filled 2020-12-14: qty 6, 5d supply, fill #0

## 2020-12-14 NOTE — Assessment & Plan Note (Signed)
Dana Hughes reports Miralax has not helped her in the past. She is agreeable to try another agent. - Start Senokot-S daily

## 2020-12-14 NOTE — Patient Instructions (Addendum)
[name], it was a pleasure seeing you today!  Today we discussed: -Your heart rate was elevated today. We are sending you to the Emergency Room to rule out another blood clot.  I have ordered the following labs today:   Lab Orders         Glucose, capillary         POC Hbg A1C        Follow-up:  1 weeks    Please make sure to arrive 15 minutes prior to your next appointment. If you arrive late, you may be asked to reschedule.   We look forward to seeing you next time. Please call our clinic at [phone] if you have any questions or concerns. The best time to call is Monday-Friday from 9am-4pm, but there is someone available 24/7. If after hours or the weekend, call the main hospital number and ask for the Internal Medicine Resident On-Call. If you need medication refills, please notify your pharmacy one week in advance and they will send [place] a request.  Thank you for letting us take part in your care. Wishing you the best!  Thank you, Sanjuan Dame, MD

## 2020-12-14 NOTE — ED Triage Notes (Signed)
Pt here from home with c/o cp and sob , pt was just took off her blood thinners from having a very large PE back in April due to having some blood in her stool

## 2020-12-14 NOTE — Assessment & Plan Note (Signed)
A1c today 7.2% from 10.8%. Dana Hughes reports she has not picked up liraglutide prescribed at her last visit due to confusion over which pharmacy this was sent to. She does report that she has been taking her insulin more regularly over the last week. She denies polyuria, polydipsia. Unfortunately, we were unable to further discuss her diabetes regimen given other issues during this visit.  - Continue metformin '1000mg'$  twice daily - Lantus 20U QHS - Aspart 10U twice daily - Start liraglutide - Follow-up A1c in 3 months

## 2020-12-14 NOTE — Assessment & Plan Note (Signed)
Ms. Daulton reports she has had continued rectal bleeding since stopping Eliquis last week. She notes that she has had no change in symptoms. Unfortunately, she was unable to go to her GI appointment earlier this week. She has another appointment scheduled with GI for 8/30. Emphasized the importance of follow-up with them. We will defer rectal exam today given recent exam last week without acute changes. - Continue holding Eliquis - Follow-up with GI on 8/30

## 2020-12-14 NOTE — Assessment & Plan Note (Signed)
Ms. Groeber reports she has had continued migraines since her visit last week. Unfortunately, she was unable to pick up Imitrex from the pharmacy yet. We were unable to discuss this much further given other acute issues during her visit today.  - Continue holding all NSAID's - Sumatriptan '50mg'$  PRN

## 2020-12-14 NOTE — ED Provider Notes (Signed)
Emergency Medicine Provider Triage Evaluation Note  Dana Hughes , a 40 y.o. female  was evaluated in triage.  Pt complains of possible pulmonary embolism.  Coming from home but evaluated at her internal medicine office today, prior history of a large PE but discontinued anticoagulant due to blood in stool.  Review of Systems  Positive: Chest pain, shortness of breath Negative: Cough, fever  Physical Exam  BP (!) 130/94   Pulse (!) 106   Temp 98 F (36.7 C)   Resp 16   SpO2 100%  Gen:   Awake, no distress   Resp:  Normal effort  MSK:   Moves extremities without difficulty  Other:    Medical Decision Making  Medically screening exam initiated at 5:37 PM.  Appropriate orders placed.  Dana Hughes was informed that the remainder of the evaluation will be completed by another provider, this initial triage assessment does not replace that evaluation, and the importance of remaining in the ED until their evaluation is complete.     Janeece Fitting, PA-C 12/14/20 1738    Lajean Saver, MD 12/14/20 [phone]

## 2020-12-14 NOTE — ED Notes (Signed)
Called, no response.

## 2020-12-14 NOTE — Assessment & Plan Note (Signed)
Dana Hughes reports she has continued to take her iron supplements, although they are very large. Mentions that she sometimes has to break these up. Will switch from polysaccharide iron supplements to ferrous sulfate today. We will start with every other day dosing in order to decrease GI side effects.  - Stop polysaccharide iron complex - Start ferrous sulfate '325mg'$  every other day

## 2020-12-14 NOTE — Progress Notes (Signed)
Transported patient via wheelchair to ED for ? Pulmonary Embolism.  Patient alert and oriented .  Sander Nephew, RN 12/14/2020 4:14 PM.

## 2020-12-14 NOTE — ED Notes (Signed)
Called CT to find out status on list. Pt is pending HCG. Updated pt on plan of care and ER wait.

## 2020-12-14 NOTE — Progress Notes (Signed)
   CC: chest pains, fatigue  HPI:  [name] is a 40 y.o. with medical history as below presenting to [facility] for follow-up for GI bleeding.  Please see problem-based list for further details, assessments, and plans.  Past Medical History:  Diagnosis Date   Anxiety    Arthritis    Asthma    Bronchitis    BV (bacterial vaginosis)    Candidiasis    Depression    Diabetes mellitus without complication ([place])    Endometriosis    Headache(784.0)    migraines   Hypertension    no longer takes meds   Migraines    Muscle spasm    Ovarian cyst    Pelvic pain in female 09/29/2015   Scoliosis    Uterine fibroid    Review of Systems:  As per HPI  Physical Exam:  Vitals:   12/14/20 1509  BP: 111/79  Pulse: (!) 110  Temp: 98.7 F (37.1 C)  TempSrc: Oral  SpO2: 100%  Weight: 194 lb 8 oz (88.2 kg)  Height: '5\' 3"'$  (1.6 m)   General: Resting in chair, no acute distress CV: Tachycardic, regular rhythm. No murmurs. Pulm: Normal work of breathing on room air. Clear to ausculation bilaterally. MSK: Normal bulk, tone. No pitting edema bilaterally. Skin: Warm, dry. No rashes or lesions appreciated. Neuro: Awake, alert, conversing appropriately. Psych: Normal mood, affect, speech.  Assessment & Plan:   See Encounters Tab for problem based charting.  Patient discussed with Dr. Dareen Piano

## 2020-12-14 NOTE — Assessment & Plan Note (Signed)
Ms. Laisure is presenting today after being found to have GI bleed during her visit last week. At that appointment, she was instructed to hold Eliquis. Since this time, Ms. Mutton reports that she has had continued chest pains and dyspnea, although appears to be a chronic issue for her. However, she was found to be tachycardic on arrival to 110. She does mention some palpitations over the last week as well. Notes that she has been having a dry cough over the last week, denies hemoptysis. Given her history of extensive bilateral pulmonary embolisms and being off of anticoagulation, she is at high risk for further clotting.  At this time, would be most appropriate for Ms. Arshad to be further evaluated in the Emergency Department to ensure she is not having any further clots. I have discussed this with Ms. Adair Laundry, and she agreed to the plan.  Attempted report to ED x3, unable to reach charge RN. Will have clinic staff take Ms. Leaming upstairs for further evaluation. - Follow-up in ED for PE rule-out

## 2020-12-14 NOTE — Discharge Instructions (Addendum)
  You were evaluated in the Emergency Department and after careful evaluation, we did not find any emergent condition requiring admission or further testing in the hospital.   Your exam/testing today was overall reassuring.  Symptoms seem to be due to pneumonia.  I attached your CT findings for you to go over with your primary care doctor, I also did refer you to a pulmonologist, you should be getting a call about this.  You do have a lung nodule as we discussed, please follow-up with the pulmonologist or your primary care doctor about this.  I also want you to talk to your primary care doctor about restarting your Eliquis as we discussed you are high risk.  Please continue to follow-up with your GI doctor about your rectal bleeding, as we discussed your hemoglobin has dropped very slightly.  Your platelets are also significantly high, looks like they have been high in the past, please speak to your PCP about this as well.  If you develop any new worsening concerning symptoms such as fatigue, weakness, worsening chest pain or shortness of breath please come back to the emergency department.  I did prescribe you antibiotics for your pneumonia and you can use the attached instructions to read more about this. Please return to the Emergency Department if you experience any worsening of your condition.  Thank you for allowing [place] to be a part of your care. Please speak to your pharmacist about any new medications prescribed today in regards to side effects or interactions with other medications.     IMPRESSION:  No acute pulmonary embolism. Multiple synechia within the lower  lobar pulmonary arteries bilaterally in keeping with the sequela of  chronic pulmonary embolism. Enlargement of the central pulmonary  arteries in keeping with pulmonary arterial hypertension.     Bronchial wall thickening in keeping with airway inflammation.  Superimposed mosaic attenuation of the lungs bilaterally likely  relates  to small airways disease.     Focal infiltrate within the right lower lobe, likely infectious or  inflammatory.     6 mm indeterminate pulmonary nodule within the right upper lobe.  Non-contrast chest CT at 6-12 months is recommended. If the nodule  is stable at time of repeat CT, then future CT at 18-24 months (from  today's scan) is considered optional for low-risk patients, but is  recommended for high-risk patients. This recommendation follows the  consensus statement: Guidelines for Management of Incidental  Pulmonary Nodules Detected on CT Images: From the Fleischner Society  2017; Radiology 2017; 284:228-243.

## 2020-12-14 NOTE — ED Provider Notes (Addendum)
[facility] EMERGENCY DEPARTMENT Provider Note   CSN: [id number] Arrival date & time: 12/14/20  1605     History No chief complaint on file.   Dana Hughes is a 40 y.o. female with history of PE, diabetes, hypertension that presents the emerge department today for chest pain and shortness of breath.  Patient states that she was diagnosed with submassive PE in April of this year, was placed on Eliquis and has been doing fine.  States that last week her doctor told her to stop taking the Eliquis to obtain a colonoscopy that she had scheduled on the 30th.  Patient is currently being evaluated with GI doctor for rectal bleeding, states that she has a family history of colon cancer.  When she was admitted in April there were some sessile colon polyp seen on colonoscopy, bleeding had resolved when she was admitted and had recently restarted. Did recently see GI who ordered the repeat colonoscopy. Patient states that when she stopped taking her Eliquis on Friday she started developingchest pain and shortness of breath.  States that has continued throughout the weekend and up until today.  States that it is not worse with exertion, has been constant, chest pain is primarily on the right side, is pleuritic.  Patient also admits to a dry cough and chills and fevers.  Patient has been vaccinated against COVID.  Also admits to some slight congestion, denies any sore throat, myalgias.  Denies any sick contacts.  Still admits to some rectal bleeding, however states that it has been lightening up today.  States that it is primarily on the toilet paper, also noted some drops of blood in the toilet bowl, this has been ongoing.  Admits to intermitten mild abdominal cramping.  Not currently having this. No urinary or pelvic symptoms. No nausea, vomiting, change in bowels.   HPI     Past Medical History:  Diagnosis Date   Anxiety    Arthritis    Asthma    Bronchitis    BV (bacterial  vaginosis)    Candidiasis    Depression    Diabetes mellitus without complication ([place])    Endometriosis    Headache(784.0)    migraines   Hypertension    no longer takes meds   Migraines    Muscle spasm    Ovarian cyst    Pelvic pain in female 09/29/2015   Scoliosis    Uterine fibroid     Patient Active Problem List   Diagnosis Date Noted   Constipation 12/10/2020   Complex ovarian cyst 12/10/2020   Atypical migraine 12/09/2020   Rectal bleeding 12/09/2020   Mild concentric left ventricular hypertrophy (LVH) 08/12/2020   History of colonic polyps 08/12/2020   Thrombocytosis 08/12/2020   History of pulmonary embolism 08/08/2020   Fibroids 05/05/2020   Iron deficiency anemia 04/08/2020   Severe episode of recurrent major depressive disorder, with psychotic features ([place]) 03/11/2020   Generalized anxiety disorder 03/11/2020   Diabetes mellitus, type 2 ([place]) 12/09/2019   Vaginal bleeding 02/20/2019   Carpal tunnel syndrome, bilateral 01/16/2019   Hypertension 07/21/2018   Endometriosis 08/30/2017    Past Surgical History:  Procedure Laterality Date   BIOPSY  08/12/2020   Procedure: BIOPSY;  Surgeon: Mauri Pole, MD;  Location: [facility];  Service: Endoscopy;;   CHOLECYSTECTOMY     COLONOSCOPY WITH PROPOFOL N/A 08/12/2020   Procedure: COLONOSCOPY WITH PROPOFOL;  Surgeon: Mauri Pole, MD;  Location: [facility];  Service: Endoscopy;  Laterality: N/A;   ESOPHAGOGASTRODUODENOSCOPY (EGD) WITH PROPOFOL N/A 08/12/2020   Procedure: ESOPHAGOGASTRODUODENOSCOPY (EGD) WITH PROPOFOL;  Surgeon: Mauri Pole, MD;  Location: [facility];  Service: Endoscopy;  Laterality: N/A;   POLYPECTOMY  08/12/2020   Procedure: POLYPECTOMY;  Surgeon: Mauri Pole, MD;  Location: [facility];  Service: Endoscopy;;   TUBAL LIGATION       OB History     Gravida  4   Para  3   Term  3   Preterm  0   AB  1   Living  3      SAB  1   IAB  0   Ectopic  0    Multiple  0   Live Births  3        Obstetric Comments  SVD x 3         Family History  Problem Relation Age of Onset   Migraines Mother    Cancer Father    Colon cancer Father    Lupus Paternal Grandmother    Cancer Paternal Grandmother    Breast cancer Paternal Grandmother    Migraines Maternal Aunt    Hypertension Maternal Aunt    Breast cancer Maternal Aunt    Colon cancer Paternal Aunt    Breast cancer Paternal Aunt    Ovarian cancer Cousin     Social History   Tobacco Use   Smoking status: Some Days    Types: Cigars   Smokeless tobacco: Never   Tobacco comments:    3 cigars/week  Vaping Use   Vaping Use: Never used  Substance Use Topics   Alcohol use: Not Currently   Drug use: Yes    Frequency: 7.0 times per week    Types: Marijuana    Comment: 4x daily before PE, cutting back now, still daily    Home Medications Prior to Admission medications   Medication Sig Start Date End Date Taking? Authorizing Provider  amoxicillin-clavulanate (AUGMENTIN) 875-125 MG tablet Take 1 tablet by mouth every 12 (twelve) hours for 7 days. 12/14/20 12/21/20 Yes Lamyia Cdebaca, PA-C  azithromycin (ZITHROMAX) 250 MG tablet Take 1 tablet (250 mg total) by mouth daily. Take first 2 tablets together, then 1 every day until finished. 12/14/20  Yes Alfredia Client, PA-C  acetaminophen (TYLENOL) 500 MG tablet Take 2 tablets (1,000 mg total) by mouth every 6 (six) hours as needed for mild pain (or Fever >/= 101). 08/12/20   Christian, [name], MD  albuterol (PROVENTIL HFA;VENTOLIN HFA) 108 (90 Base) MCG/ACT inhaler Inhale 2 puffs into the lungs every 6 (six) hours as needed for wheezing or shortness of breath. Reported on 09/29/2015    [provider]  albuterol (PROVENTIL) (2.5 MG/3ML) 0.083% nebulizer solution Take 3 mLs (2.5 mg total) by nebulization every 6 (six) hours as needed for wheezing or shortness of breath. 07/02/18   Wurst, [place], PA-C  apixaban (ELIQUIS) 5 MG TABS  tablet Take 1 tablet (5 mg ) twice a day. Get Refills at [facility] 09/07/20   Asencion Noble, MD  ARIPiprazole (ABILIFY) 10 MG tablet Take 1 tablet (10 mg total) by mouth daily. 10/11/20   Salley Slaughter, NP  artificial tears (LACRILUBE) OINT ophthalmic ointment Place into both eyes at bedtime as needed for dry eyes. 05/02/18   Langston Masker B, PA-C  busPIRone (BUSPAR) 10 MG tablet Take 1 tablet (10 mg total) by mouth 3 (three) times daily. 10/11/20   Salley Slaughter, NP  dicyclomine (BENTYL) 10 MG capsule Take 1 capsule (10 mg total) by mouth 4 (four) times daily as needed for spasms (abdominal pain). 06/20/20   Jose Persia, MD  ferrous sulfate 325 (65 FE) MG tablet Take 1 tablet (325 mg total) by mouth every other day. 12/14/20 08/11/21  Sanjuan Dame, MD  fluticasone (FLONASE) 50 MCG/ACT nasal spray PLACE 1 SPRAY INTO BOTH NOSTRILS IN THE MORNING AND AT BEDTIME. 06/20/20 06/20/21  Jose Persia, MD  glucose blood test strip Check your sugar in the morning before you eat breakfast, and one hour after a meal. 11/12/19   Melynda Ripple, MD  glucose monitoring kit (FREESTYLE) monitoring kit 1 each by Does not apply route daily. Check glucose once in the morning before breakfast and 1 hour after a meal 11/12/19   Melynda Ripple, MD  guaifenesin (ROBITUSSIN) 100 MG/5ML syrup Take 200 mg by mouth 3 (three) times daily as needed for cough.    [provider]  hydrOXYzine (ATARAX/VISTARIL) 25 MG tablet Take 1 tablet (25 mg total) by mouth 3 (three) times daily as needed. 10/11/20   Salley Slaughter, NP  insulin aspart (NOVOLOG) 100 UNIT/ML FlexPen Inject 10 Units into the skin 2 (two) times daily with a meal. 12/10/20   Marianna Payment, MD  insulin glargine (LANTUS) 100 UNIT/ML Solostar Pen Inject 20 Units into the skin daily at 10pm. 12/10/20   Marianna Payment, MD  Insulin Pen Needle 32G X 4 MM MISC Use as directed 4 times daily. 12/10/20   Marianna Payment,  MD  Insulin Pen Needle 32G X 4 MM MISC USE AS DIRECTED IN THE MORNING, NOON AND AT BEDTIME. 02/19/20 02/18/21  [place], Carley D, DO  liraglutide (VICTOZA) 18 MG/3ML SOPN Inject 0.6 mg into the skin daily for 7 days, THEN 1.2 mg daily for 23 days. 12/12/20 01/11/21  Marianna Payment, MD  meclizine (ANTIVERT) 25 MG tablet Take 1 tablet (25 mg total) by mouth 3 (three) times daily as needed for dizziness. 05/06/18   Melynda Ripple, MD  metFORMIN (GLUCOPHAGE) 1000 MG tablet Take 1 tablet (1,000 mg total) by mouth 2 (two) times daily with a meal. 12/10/20   Marianna Payment, MD  mirtazapine (REMERON) 30 MG tablet TAKE 1 TABLET (30 MG TOTAL) BY MOUTH AT BEDTIME. 10/11/20 10/11/21  Eulis Canner E, NP  nystatin (MYCOSTATIN) 100000 UNIT/ML suspension TAKE 5 MLS BY MOUTH FOUR TIMES DAILY FOR 7 DAYS. 06/20/20 06/20/21  Jose Persia, MD  oxyCODONE (OXY IR/ROXICODONE) 5 MG immediate release tablet Take 1 tablet (5 mg total) by mouth 2 (two) times daily as needed for severe pain. 09/02/20   Axel Filler, MD  pantoprazole (PROTONIX) 40 MG tablet Take 1 tablet (40 mg total) by mouth daily. 08/12/20   Mitzi Hansen, MD  senna-docusate (SENOKOT S) 8.6-50 MG tablet Take 1 tablet by mouth daily. 12/14/20   Sanjuan Dame, MD  SUMAtriptan (IMITREX) 50 MG tablet Take 1 tablet (50 mg total) by mouth every 2 (two) hours as needed for migraine. May repeat in 2 hours if headache persists or recurs. Please take no more than 3 tablets (150 mg) in 24 hours. 12/10/20   Marianna Payment, MD    Allergies    Mushroom extract complex, Latex, and Penicillins  Review of Systems   Review of Systems  Constitutional:  Positive for chills and fever. Negative for diaphoresis and fatigue.  HENT:  Negative for congestion, sore throat and trouble swallowing.   Eyes:  Negative for pain and visual disturbance.  Respiratory:  Positive for cough and shortness of breath. Negative for wheezing.   Cardiovascular:  Positive for chest  pain. Negative for palpitations and leg swelling.  Gastrointestinal:  Negative for abdominal distention, abdominal pain, diarrhea, nausea and vomiting.  Genitourinary:  Negative for difficulty urinating.  Musculoskeletal:  Negative for back pain, neck pain and neck stiffness.  Skin:  Negative for pallor.  Neurological:  Negative for dizziness, speech difficulty, weakness and headaches.  Psychiatric/Behavioral:  Negative for confusion.    Physical Exam Updated Vital Signs BP 132/82   Pulse 89   Temp 98 F (36.7 C) (Oral)   Resp (!) 21   SpO2 100%   Physical Exam Constitutional:      General: She is not in acute distress.    Appearance: Normal appearance. She is not ill-appearing, toxic-appearing or diaphoretic.  HENT:     Mouth/Throat:     Mouth: Mucous membranes are moist.     Pharynx: Oropharynx is clear.  Eyes:     General: No scleral icterus.    Extraocular Movements: Extraocular movements intact.     Pupils: Pupils are equal, round, and reactive to light.  Cardiovascular:     Rate and Rhythm: Regular rhythm. Tachycardia present.     Pulses: Normal pulses.     Heart sounds: Normal heart sounds.  Pulmonary:     Effort: Pulmonary effort is normal. No respiratory distress.     Breath sounds: Normal breath sounds. No stridor. No wheezing, rhonchi or rales.     Comments: TTP of chest as depicted in graphic.  No ecchymosis or erythema noted.  No flail chest. Chest:     Chest wall: Tenderness present.    Abdominal:     General: Abdomen is flat. There is no distension.     Palpations: Abdomen is soft.     Tenderness: There is no abdominal tenderness. There is no guarding or rebound.  Genitourinary:    Comments: Deferred  Musculoskeletal:        General: No swelling or tenderness. Normal range of motion.     Cervical back: Normal range of motion and neck supple. No rigidity.     Right lower leg: No edema.     Left lower leg: No edema.  Skin:    General: Skin is warm and  dry.     Capillary Refill: Capillary refill takes less than 2 seconds.     Coloration: Skin is not pale.  Neurological:     General: No focal deficit present.     Mental Status: She is alert and oriented to person, place, and time.  Psychiatric:        Mood and Affect: Mood normal.        Behavior: Behavior normal.    ED Results / Procedures / Treatments   Labs (all labs ordered are listed, but only abnormal results are displayed) Labs Reviewed  BASIC METABOLIC PANEL - Abnormal; Notable for the following components:      Result Value   Glucose, Bld 205 (*)    All other components within normal limits  CBC - Abnormal; Notable for the following components:   WBC 11.1 (*)    Hemoglobin 8.4 (*)    HCT 29.8 (*)    MCV 77.0 (*)    MCH 21.7 (*)    MCHC 28.2 (*)    RDW 15.9 (*)    Platelets 665 (*)    All other components within normal limits  RESP PANEL BY RT-PCR (FLU  A&B, COVID) ARPGX2  PROTIME-INR  I-STAT BETA HCG BLOOD, ED ([facility], [facility], [facility] ONLY)  TROPONIN I (HIGH SENSITIVITY)  TROPONIN I (HIGH SENSITIVITY)    EKG None  Radiology DG Chest 2 View  Result Date: 12/14/2020 CLINICAL DATA:  Chest pain and shortness of breath. EXAM: CHEST - 2 VIEW COMPARISON:  Radiograph and CT 08/08/2020 FINDINGS: The cardiomediastinal contours are normal. The lungs are clear. Pulmonary vasculature is normal. No consolidation, pleural effusion, or pneumothorax. No acute osseous abnormalities are seen. IMPRESSION: Negative radiographs of the chest. Electronically Signed   By: Keith Rake M.D.   On: 12/14/2020 18:05   CT Angio Chest PE W and/or Wo Contrast  Result Date: 12/14/2020 CLINICAL DATA:  PE suspected, high prob EXAM: CT ANGIOGRAPHY CHEST WITH CONTRAST TECHNIQUE: Multidetector CT imaging of the chest was performed using the standard protocol during bolus administration of intravenous contrast. Multiplanar CT image reconstructions and MIPs were obtained to evaluate the vascular anatomy.  CONTRAST:  40m OMNIPAQUE IOHEXOL 350 MG/ML SOLN COMPARISON:  None. FINDINGS: Cardiovascular: There is adequate opacification of the pulmonary arterial tree. There are multiple synechia identified within the lower lobar pulmonary arteries bilaterally in keeping with the chronic sequela of pulmonary embolism. There are no intraluminal filling defects identified, however, to suggest acute pulmonary embolism. The central pulmonary arteries are enlarged in keeping with pulmonary arterial hypertension. Cardiac size is within normal limits. No pericardial effusion. No significant coronary artery calcification. The thoracic aorta is unremarkable. Mediastinum/Nodes: No enlarged mediastinal, hilar, or axillary lymph nodes. Thyroid gland, trachea, and esophagus demonstrate no significant findings. Lungs/Pleura: There is focal pulmonary infiltrate within the right lower lobe,, possibly infectious or inflammatory in nature. There is superimposed mosaic attenuation of the lungs likely related to multifocal air trapping related to small airways disease. A 6 mm noncalcified subpleural pulmonary nodule is seen within the right upper lobe, axial image # 65/6, indeterminate. The lungs are otherwise clear. There is mild bronchial wall thickening involving the central airways in keeping with airway inflammation. Upper Abdomen: Cholecystectomy has been performed. No acute abnormality. Musculoskeletal: No chest wall abnormality. No acute or significant osseous findings. Review of the MIP images confirms the above findings. IMPRESSION: No acute pulmonary embolism. Multiple synechia within the lower lobar pulmonary arteries bilaterally in keeping with the sequela of chronic pulmonary embolism. Enlargement of the central pulmonary arteries in keeping with pulmonary arterial hypertension. Bronchial wall thickening in keeping with airway inflammation. Superimposed mosaic attenuation of the lungs bilaterally likely relates to small airways  disease. Focal infiltrate within the right lower lobe, likely infectious or inflammatory. 6 mm indeterminate pulmonary nodule within the right upper lobe. Non-contrast chest CT at 6-12 months is recommended. If the nodule is stable at time of repeat CT, then future CT at 18-24 months (from today's scan) is considered optional for low-risk patients, but is recommended for high-risk patients. This recommendation follows the consensus statement: Guidelines for Management of Incidental Pulmonary Nodules Detected on CT Images: From the Fleischner Society 2017; Radiology 2017; 284:228-243. Electronically Signed   By: AFidela SalisburyM.D.   On: 12/14/2020 22:22    Procedures Procedures   Medications Ordered in ED Medications  iohexol (OMNIPAQUE) 350 MG/ML injection 80 mL (80 mLs Intravenous Contrast Given 12/14/20 2207)    ED Course  I have reviewed the triage vital signs and the nursing notes.  Pertinent labs & imaging results that were available during my care of the patient were reviewed by me and considered in my medical decision  making (see chart for details).    MDM Rules/Calculators/A&P                           Azusena JONEISHA MILES is a 40 y.o. female with history of PE, diabetes, hypertension that presents the emerge department today for chest pain and shortness of breath.  Patient appears well, is minimally tachycardic and slightly tachypneic otherwise appears very well.  Satting at 100% on room air.  Differential to include small PE versus pneumonia versus COVID.  Atypical for ACS, however will obtain troponins.  Work-up today shows unremarkable BMP, CBC does show white count of 11.1, does show hemoglobin of 8.4 which is not too far from patient's baseline.  Did discuss this with patient, patient states that she does not want to be worked up for her anemia/GI bleeding here since she is being thoroughly followed with her GI doctor.  Deferred GU exam.  States that she primarily came here for her  chest pain and shortness of breath, states that she just saw her GI doctor and is going to follow-up with them this week.  Denies any fatigue or anemia symptoms besides the chest pain and shortness of breath.  CBC also does show elevated platelets, appears to be baseline for the past couple of months, patient will recheck with PCP.  Chest x-ray interpreted view does not show any acute cardiopulmonary disease, no signs of pneumonia.  COVID test is negative.  EKG interpreted by me and Dr. Francia Greaves does not appear ischemic, appears to be patient's baseline.  2 negative flat troponins are reassuring.  CT PE study does not show acute PE, however does show right lower lobe infiltrate in addition to 6 mm pulmonary nodule.  Patient is already aware of nodule, will follow up with pulmonology for this.  Findings and symptoms consistent with pneumonia, will treat for this.  Upon reevaluation tachycardia and tachypnea have resolved with rest, patient appears well, nontoxic-appearing.  Patient to be discharged, will follow up with PCP.Doubt need for further emergent work up at this time. I explained the diagnosis and have given explicit precautions to return to the ER including for any other new or worsening symptoms. The patient understands and accepts the medical plan as it's been dictated and I have answered their questions. Discharge instructions concerning home care and prescriptions have been given. The patient is STABLE and is discharged to home in good condition.  I discussed this case with my attending physician who cosigned this note including patient's presenting symptoms, physical exam, and planned diagnostics and interventions. Attending physician stated agreement with plan or made changes to plan which were implemented.   Attending physician assessed patient at bedside.  Final Clinical Impression(s) / ED Diagnoses Final diagnoses:  Community acquired pneumonia of right lower lobe of lung    Rx / DC  Orders ED Discharge Orders          Ordered    Ambulatory referral to Pulmonology        12/14/20 2238    amoxicillin-clavulanate (AUGMENTIN) 875-125 MG tablet  Every 12 hours        12/14/20 2243    azithromycin (ZITHROMAX) 250 MG tablet  Daily        12/14/20 2243               Alfredia Client, PA-C 12/14/20 2314    Valarie Merino, MD 12/17/20 0004

## 2020-12-15 ENCOUNTER — Other Ambulatory Visit: Payer: Self-pay

## 2020-12-15 ENCOUNTER — Other Ambulatory Visit (HOSPITAL_COMMUNITY): Payer: Self-pay

## 2020-12-19 NOTE — Progress Notes (Addendum)
Internal Medicine Clinic Attending ? ?Case discussed with Dr. Braswell  At the time of the visit.  We reviewed the resident?s history and exam and pertinent patient test results.  I agree with the assessment, diagnosis, and plan of care documented in the resident?s note.  ?

## 2020-12-20 ENCOUNTER — Ambulatory Visit: Payer: Medicaid Other | Admitting: Nurse Practitioner

## 2020-12-21 ENCOUNTER — Ambulatory Visit: Payer: Medicaid Other | Admitting: Gastroenterology

## 2020-12-22 ENCOUNTER — Other Ambulatory Visit (HOSPITAL_COMMUNITY): Payer: Self-pay

## 2020-12-22 ENCOUNTER — Other Ambulatory Visit: Payer: Self-pay

## 2020-12-23 ENCOUNTER — Other Ambulatory Visit (HOSPITAL_COMMUNITY): Payer: Self-pay

## 2020-12-27 ENCOUNTER — Telehealth: Payer: Self-pay

## 2020-12-27 NOTE — Telephone Encounter (Signed)
ERROR

## 2020-12-28 ENCOUNTER — Encounter: Payer: Medicaid Other | Admitting: Internal Medicine

## 2020-12-29 ENCOUNTER — Other Ambulatory Visit: Payer: Self-pay

## 2020-12-29 ENCOUNTER — Encounter: Payer: Self-pay | Admitting: Student

## 2020-12-29 ENCOUNTER — Ambulatory Visit (INDEPENDENT_AMBULATORY_CARE_PROVIDER_SITE_OTHER): Payer: Self-pay | Admitting: Student

## 2020-12-29 DIAGNOSIS — K625 Hemorrhage of anus and rectum: Secondary | ICD-10-CM

## 2020-12-29 DIAGNOSIS — Z86711 Personal history of pulmonary embolism: Secondary | ICD-10-CM

## 2020-12-29 MED ORDER — APIXABAN 5 MG PO TABS
ORAL_TABLET | ORAL | 1 refills | Status: DC
  Filled 2020-12-29: qty 60, 30d supply, fill #0
  Filled 2021-05-21 – 2021-06-14 (×3): qty 60, fill #0

## 2020-12-29 NOTE — Assessment & Plan Note (Signed)
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  She is no longer on Megace.  I believe it is appropriate to treat her with anticoagulation for 6 months since this was a provoked PE.  Her anticoagulation was stopped given recent history of rectal bleeding.  However she is unable to obtain a colonoscopy at this time and she reports resolution of her rectal bleeding for the past 2 weeks so we will continue Eliquis at this time to prevent development of another PE.  Plan: -Continue Eliquis

## 2020-12-29 NOTE — Assessment & Plan Note (Signed)
Patient with recent history of rectal bleeding.  She has a history of pulmonary embolism in 07/2020 for which she was started on Eliquis.  Eliquis was stopped about 3 weeks ago due to rectal bleeding.  She was scheduled to see GI for a repeat colonoscopy to determine etiology of rectal bleeding but has missed the past 2 appointments.  Per patient, GI requested that she needed to pay a $50 co-pay in order to be rescheduled for another colonoscopy.  She does not have the money to be able to afford this at this time.  She reports not having any rectal bleeding for the past 2 weeks.  She does want to restart her home Eliquis due to fear of developing another pulmonary embolism.  At this time, given that she is unable to obtain a colonoscopy and that her rectal bleeding has resolved, I believe it is appropriate to restart her home Eliquis.  Nonetheless, it is imperative that she obtain a repeat colonoscopy in the near future given that she does have a family history of colon cancer in her father (diagnosed at age 55) and paternal aunt (diagnosed at age 37).  I will have her come in to see if she is a candidate for financial assistance as she is currently unemployed.  Plan: -restart eliquis -colonoscopy with GI once able -financial assistance

## 2020-12-29 NOTE — Patient Instructions (Signed)
Dana Hughes,  It was a pleasure seeing you in the clinic today.   It is very important that you obtain a colonoscopy once you are able to do so. Please restart taking your eliquis at home. I have sent another prescription to [facility]. If you develop another rectal bleed, then please stop taking your eliquis and call the clinic. We will be giving you paperwork that you will need to fill out to see if you qualify for financial assistance.  Please call our clinic at [phone] if you have any questions or concerns. The best time to call is Monday-Friday from 9am-4pm, but there is someone available 24/7 at the same number. If you need medication refills, please notify your pharmacy one week in advance and they will send [place] a request.   Thank you for letting us take part in your care. We look forward to seeing you next time!

## 2020-12-29 NOTE — Progress Notes (Signed)
   CC: f/u of rectal bleeding and restarting eliquis  HPI:  [name] is a 40 y.o. female with history listed below presenting to the [facility] for f/u of rectal bleeding and restarting eliquis. Please see individualized problem based charting for full HPI.  Past Medical History:  Diagnosis Date  . Anxiety   . Arthritis   . Asthma   . Bronchitis   . BV (bacterial vaginosis)   . Candidiasis   . Depression   . Diabetes mellitus without complication ([place])   . Endometriosis   . Headache(784.0)    migraines  . Hypertension    no longer takes meds  . Migraines   . Muscle spasm   . Ovarian cyst   . Pelvic pain in female 09/29/2015  . Scoliosis   . Uterine fibroid     Review of Systems:  Negative aside from that listed in individualized problem based charting.  Physical Exam:  Vitals:   12/29/20 1428  BP: 133/90  Pulse: 80  Resp: (!) 24  Temp: 98.2 F (36.8 C)  TempSrc: Oral  SpO2: 100%  Weight: 195 lb (88.5 kg)  Height: '5\' 3"'$  (1.6 m)   Physical Exam Constitutional:      Appearance: She is obese. She is not ill-appearing.  Eyes:     Extraocular Movements: Extraocular movements intact.     Pupils: Pupils are equal, round, and reactive to light.  Cardiovascular:     Rate and Rhythm: Normal rate and regular rhythm.     Heart sounds: Normal heart sounds. No murmur heard.   No friction rub.  Pulmonary:     Effort: Pulmonary effort is normal. No tachypnea or respiratory distress.     Breath sounds: Normal breath sounds.  Abdominal:     General: Bowel sounds are normal.     Palpations: Abdomen is soft.     Tenderness: There is no guarding or rebound.  Musculoskeletal:     Cervical back: Normal range of motion.  Skin:    General: Skin is warm and dry.  Neurological:     General: No focal deficit present.     Mental Status: She is alert and oriented to person, place, and time.  Psychiatric:        Mood and Affect: Mood normal.        Behavior: Behavior normal.      Assessment & Plan:   See Encounters Tab for problem based charting.  Patient discussed with Dr.  Jimmye [place]

## 2021-01-04 ENCOUNTER — Encounter: Payer: Medicaid Other | Admitting: Internal Medicine

## 2021-01-09 NOTE — Progress Notes (Signed)
Internal Medicine Clinic Attending  Case discussed with Dr. Jinwala  At the time of the visit.  We reviewed the resident's history and exam and pertinent patient test results.  I agree with the assessment, diagnosis, and plan of care documented in the resident's note.  

## 2021-01-11 ENCOUNTER — Encounter (HOSPITAL_COMMUNITY): Payer: Self-pay | Admitting: Psychiatry

## 2021-01-11 ENCOUNTER — Telehealth (INDEPENDENT_AMBULATORY_CARE_PROVIDER_SITE_OTHER): Payer: No Payment, Other | Admitting: Psychiatry

## 2021-01-11 ENCOUNTER — Other Ambulatory Visit: Payer: Self-pay

## 2021-01-11 DIAGNOSIS — F3181 Bipolar II disorder: Secondary | ICD-10-CM

## 2021-01-11 DIAGNOSIS — F411 Generalized anxiety disorder: Secondary | ICD-10-CM | POA: Diagnosis not present

## 2021-01-11 MED ORDER — ARIPIPRAZOLE 15 MG PO TABS
15.0000 mg | ORAL_TABLET | Freq: Every day | ORAL | 3 refills | Status: DC
  Filled 2021-01-11: qty 30, 30d supply, fill #0

## 2021-01-11 MED ORDER — HYDROXYZINE HCL 25 MG PO TABS
25.0000 mg | ORAL_TABLET | Freq: Three times a day (TID) | ORAL | 3 refills | Status: DC | PRN
Start: 2021-01-11 — End: 2021-04-13
  Filled 2021-01-11: qty 90, 30d supply, fill #0

## 2021-01-11 MED ORDER — MIRTAZAPINE 45 MG PO TABS
45.0000 mg | ORAL_TABLET | Freq: Every day | ORAL | 3 refills | Status: DC
Start: 2021-01-11 — End: 2021-04-13
  Filled 2021-01-11: qty 30, 30d supply, fill #0

## 2021-01-11 MED ORDER — BUSPIRONE HCL 10 MG PO TABS
10.0000 mg | ORAL_TABLET | Freq: Three times a day (TID) | ORAL | 3 refills | Status: DC
Start: 2021-01-11 — End: 2021-04-13
  Filled 2021-01-11: qty 90, 30d supply, fill #0
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
## 2021-01-12 ENCOUNTER — Other Ambulatory Visit: Payer: Self-pay

## 2021-01-18 ENCOUNTER — Other Ambulatory Visit: Payer: Self-pay

## 2021-01-19 ENCOUNTER — Ambulatory Visit (INDEPENDENT_AMBULATORY_CARE_PROVIDER_SITE_OTHER): Payer: No Payment, Other | Admitting: Licensed Clinical Social Worker

## 2021-01-19 ENCOUNTER — Encounter (HOSPITAL_COMMUNITY): Payer: Self-pay | Admitting: Licensed Clinical Social Worker

## 2021-01-19 ENCOUNTER — Other Ambulatory Visit: Payer: Self-pay

## 2021-01-19 DIAGNOSIS — F3181 Bipolar II disorder: Secondary | ICD-10-CM

## 2021-01-19 DIAGNOSIS — F4310 Post-traumatic stress disorder, unspecified: Secondary | ICD-10-CM | POA: Insufficient documentation

## 2021-01-19 NOTE — Plan of Care (Signed)
Pt agreeable to plan  ?
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
## 2021-01-30 ENCOUNTER — Encounter: Payer: Medicaid Other | Admitting: Student

## 2021-01-31 ENCOUNTER — Ambulatory Visit (INDEPENDENT_AMBULATORY_CARE_PROVIDER_SITE_OTHER): Payer: Self-pay | Admitting: Student

## 2021-01-31 ENCOUNTER — Encounter: Payer: Self-pay | Admitting: Student

## 2021-01-31 ENCOUNTER — Other Ambulatory Visit: Payer: Self-pay

## 2021-01-31 ENCOUNTER — Other Ambulatory Visit (HOSPITAL_COMMUNITY)
Admission: RE | Admit: 2021-01-31 | Discharge: 2021-01-31 | Disposition: A | Discharge status: Home / Self Care | Payer: Medicaid Other | Source: Ambulatory Visit | Attending: Internal Medicine | Admitting: Internal Medicine

## 2021-01-31 VITALS — BP 120/80 | HR 86 | Temp 98.4°F | Resp 32 | Ht 63.0 in | Wt 201.0 lb

## 2021-01-31 DIAGNOSIS — J029 Acute pharyngitis, unspecified: Secondary | ICD-10-CM | POA: Insufficient documentation

## 2021-01-31 DIAGNOSIS — N898 Other specified noninflammatory disorders of vagina: Secondary | ICD-10-CM | POA: Insufficient documentation

## 2021-01-31 DIAGNOSIS — B9689 Other specified bacterial agents as the cause of diseases classified elsewhere: Secondary | ICD-10-CM

## 2021-01-31 DIAGNOSIS — N3000 Acute cystitis without hematuria: Secondary | ICD-10-CM

## 2021-01-31 DIAGNOSIS — R300 Dysuria: Secondary | ICD-10-CM

## 2021-01-31 DIAGNOSIS — Z113 Encounter for screening for infections with a predominantly sexual mode of transmission: Secondary | ICD-10-CM

## 2021-01-31 DIAGNOSIS — B3731 Acute candidiasis of vulva and vagina: Secondary | ICD-10-CM

## 2021-01-31 DIAGNOSIS — N760 Acute vaginitis: Secondary | ICD-10-CM

## 2021-01-31 LAB — GROUP A STREP BY PCR: Group A Strep by PCR: DETECTED — AB

## 2021-01-31 NOTE — Assessment & Plan Note (Addendum)
Patient here for evaluation of a sore throat that started 5 days ago. Patient states multiple family members at home has sore throat. She has had associated cold and chills, some trouble swallowing and occasional dry cough.  She has tried Advil, Tylenol and over-the-counter cough medications with some relief. States that her throat pain is better but has not completely resolved. On exam, patient has tonsillar erythema but no significant swelling, whitish exudate on the posterior aspect of tongue and mildly tender cervical lymph nodes.  Presentation concerning for possible strep throat.  Plan: -- Throat swab to check for group A strep by PCR -- Advised to try throat lozenges, salt water gargles -- Alternate Tylenol and ibuprofen to help with throat pain -- Advised to continue to wear mask when around others  Addendum: Patient's throat swab for strep group A strep by PCR came back positive for group A strep pharyngitis. Patient informed of results and plan for antibiotics treatment. -- Amoxicillin 500 mg daily for 10 days

## 2021-01-31 NOTE — Progress Notes (Signed)
   CC: Sore throat and STI testing  HPI:  [name] is a 40 y.o. female with PMH as below who presents to clinic today for evaluation of sore throat and to get tested for STI. Please see problem based charting for evaluation, assessment and plan.  Past Medical History:  Diagnosis Date   Anxiety    Arthritis    Asthma    Bronchitis    BV (bacterial vaginosis)    Candidiasis    Depression    Diabetes mellitus without complication ([place])    Endometriosis    Headache(784.0)    migraines   Hypertension    no longer takes meds   Migraines    Muscle spasm    Ovarian cyst    Pelvic pain in female 09/29/2015   Scoliosis    Uterine fibroid     Review of Systems:  Constitutional: Positive for cold and chills. Negative for fever Mouth: Positive for sore throat and difficulty swallowing Respiratory: Negative for shortness of breath. Positive for occasional dry cough Cardiac: Negative for chest pain MSK: Negative for back pain or muscle aches. GU: Positive for burning with urination, vaginal discharge and itching Neuro: Negative for headache, dizziness or weakness  Physical Exam: General: Pleasant, well-appearing middle-age woman. No acute distress. HEENT: MMM. Mildly erythematous and swollen tonsils but no tonsillar or buccal exudate. Multiple small whitish plaques on the posterior aspect of tongue.  Neck: Mildly tender cervical adenopathy Cardiac: RRR. No murmurs, rubs or gallops. No LE edema Respiratory: Lungs CTAB. No wheezing or crackles. Abdominal: Soft, symmetric and non tender. Normal BS. GU: Normal external genitalia. No lesions or erythema on the vulva. Copious whitish discharge in os. No masses. No vaginal or cervical tenderness.   Vitals:   01/31/21 1540  BP: 120/80  Pulse: 86  Resp: (!) 32  Temp: 98.4 F (36.9 C)  TempSrc: Oral  SpO2: 100%  Weight: 201 lb (91.2 kg)  Height: 5\' 3"  (1.6 m)     Assessment & Plan:   See Encounters Tab for problem  based charting.  Patient discussed with Dr. Michelle Nasuti, MD, MPH

## 2021-01-31 NOTE — Patient Instructions (Addendum)
Thank you, Ms.[name] for allowing [place] to provide your care today. Today we discussed your sore throat, and STI testing.  We are testing you for possible strep infection and also checking you for possible UTI or STD.  For your sore throat: Recommend trying over-the-counter throat lozenges and gargling salt water as needed to help relieve the pain. You can also try alternating ibuprofen with Tylenol to help with the pain.  I have ordered the following labs for you:  Lab Orders         Group A Strep by PCR         HIV antibody (with reflex)         Urinalysis, Complete (81001)      I will call if any are abnormal. All of your labs can be accessed through "My Chart".   I have ordered the following tests: Group A strep by PCR   My Chart Access: [email or web address]  Please follow-up as needed if symptoms do not improve or worsens.  Please make sure to arrive 15 minutes prior to your next appointment. If you arrive late, you may be asked to reschedule.    We look forward to seeing you next time. Please call our clinic at [phone] if you have any questions or concerns. The best time to call is Monday-Friday from 9am-4pm, but there is someone available 24/7. If after hours or the weekend, call the main hospital number and ask for the Internal Medicine Resident On-Call. If you need medication refills, please notify your pharmacy one week in advance and they will send [place] a request.   Thank you for letting us take part in your care. Wishing you the best!  Lacinda Axon, MD 01/31/2021, 4:24 PM IM Resident, PGY-2 [facility]al[facility] Milian 41:10
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
## 2021-02-01 ENCOUNTER — Encounter: Payer: Self-pay | Admitting: Gastroenterology

## 2021-02-01 DIAGNOSIS — N390 Urinary tract infection, site not specified: Secondary | ICD-10-CM | POA: Insufficient documentation

## 2021-02-01 LAB — MICROSCOPIC EXAMINATION
Casts: NONE SEEN /lpf
Epithelial Cells (non renal): NONE SEEN /hpf (ref 0–10)
RBC, Urine: NONE SEEN /hpf (ref 0–2)

## 2021-02-01 LAB — URINALYSIS, COMPLETE
Bilirubin, UA: NEGATIVE
Glucose, UA: NEGATIVE
Ketones, UA: NEGATIVE
Nitrite, UA: POSITIVE — AB
Protein,UA: NEGATIVE
RBC, UA: NEGATIVE
Specific Gravity, UA: 1.018 (ref 1.005–1.030)
Urobilinogen, Ur: 0.2 mg/dL (ref 0.2–1.0)
pH, UA: 6.5 (ref 5.0–7.5)

## 2021-02-01 LAB — CERVICOVAGINAL ANCILLARY ONLY
Bacterial Vaginitis (gardnerella): POSITIVE — AB
Candida Glabrata: NEGATIVE
Candida Vaginitis: POSITIVE — AB
Chlamydia: NEGATIVE
Comment: NEGATIVE
Comment: NEGATIVE
Comment: NEGATIVE
Comment: NEGATIVE
Comment: NEGATIVE
Comment: NORMAL
Neisseria Gonorrhea: NEGATIVE
Trichomonas: NEGATIVE

## 2021-02-01 LAB — HIV ANTIBODY (ROUTINE TESTING W REFLEX): HIV Screen 4th Generation wRfx: NONREACTIVE

## 2021-02-01 MED ORDER — METRONIDAZOLE 500 MG PO TABS
500.0000 mg | ORAL_TABLET | Freq: Two times a day (BID) | ORAL | 0 refills | Status: AC
  Filled 2021-02-01: qty 14, 7d supply, fill #0

## 2021-02-01 MED ORDER — SULFAMETHOXAZOLE-TRIMETHOPRIM 800-160 MG PO TABS
1.0000 | ORAL_TABLET | Freq: Two times a day (BID) | ORAL | 0 refills | Status: DC
Start: 2021-02-01 — End: 2021-04-07
  Filled 2021-02-01 – 2021-02-10 (×2): qty 6, 3d supply, fill #0

## 2021-02-01 MED ORDER — MICONAZOLE NITRATE 2 % VA CREA
1.0000 | TOPICAL_CREAM | Freq: Every day | VAGINAL | 0 refills | Status: AC
Start: 2021-02-01 — End: 2021-02-08
  Filled 2021-02-01: qty 45, 7d supply, fill #0

## 2021-02-01 MED ORDER — AMOXICILLIN 500 MG PO CAPS
500.0000 mg | ORAL_CAPSULE | Freq: Every day | ORAL | 0 refills | Status: DC
  Filled 2021-02-01 – 2021-06-14 (×4): qty 10, 10d supply, fill #0

## 2021-02-01 NOTE — Addendum Note (Signed)
Addended byLinwood Dibbles on: 02/01/2021 09:49 PM   Modules accepted: Orders, Level of Service

## 2021-02-01 NOTE — Addendum Note (Signed)
Addended byLinwood Dibbles on: 02/01/2021 05:42 PM   Modules accepted: Orders

## 2021-02-01 NOTE — Assessment & Plan Note (Signed)
Patient presented on evaluation of burning with urination and vaginal itching/discharge concerning for possible STI or UTI.  She denied any flank pain, fever, hematuria, N/V or back pain. UA positive for WBCs, bacteria and nitrites.  Patient with symptoms and lab findings of acute uncomplicated cystitis.  Plan: -- Bactrim DS 800-160 mg twice daily for 3 days. -- Treating concomitant BV and Candida vaginitis infections.

## 2021-02-02 ENCOUNTER — Other Ambulatory Visit: Payer: Self-pay

## 2021-02-03 NOTE — Progress Notes (Signed)
Internal Medicine Clinic Attending  Case discussed with Dr. Amponsah  At the time of the visit.  We reviewed the resident's history and exam and pertinent patient test results.  I agree with the assessment, diagnosis, and plan of care documented in the resident's note.  

## 2021-02-09 ENCOUNTER — Other Ambulatory Visit: Payer: Self-pay

## 2021-02-10 ENCOUNTER — Other Ambulatory Visit (HOSPITAL_COMMUNITY): Payer: Self-pay

## 2021-02-10 ENCOUNTER — Other Ambulatory Visit: Payer: Self-pay

## 2021-02-17 ENCOUNTER — Other Ambulatory Visit: Payer: Self-pay

## 2021-02-22 ENCOUNTER — Telehealth (HOSPITAL_COMMUNITY): Payer: Self-pay | Admitting: Licensed Clinical Social Worker

## 2021-02-22 ENCOUNTER — Ambulatory Visit (HOSPITAL_COMMUNITY): Payer: No Payment, Other | Admitting: Licensed Clinical Social Worker

## 2021-02-22 NOTE — Telephone Encounter (Signed)
LCSW sent 2 links to patient's phone at 2:00 and at 205 with no response.  LCSW followed up with a phone call at 2:10 PM with no answer phone went directly to voicemail LCSW left a voicemail for patient.  LCSW waited in links until 215 and then followed up with another phone call for patient for 2 PM appointment phone call went directly to voicemail.  HIPAA compliant voicemail left x2.

## 2021-03-07 ENCOUNTER — Other Ambulatory Visit: Payer: Self-pay

## 2021-03-10 ENCOUNTER — Other Ambulatory Visit: Payer: Self-pay

## 2021-03-13 ENCOUNTER — Other Ambulatory Visit: Payer: Self-pay | Admitting: Obstetrics & Gynecology

## 2021-03-13 DIAGNOSIS — I10 Essential (primary) hypertension: Secondary | ICD-10-CM

## 2021-03-21 ENCOUNTER — Telehealth: Payer: Self-pay | Admitting: *Deleted

## 2021-03-21 ENCOUNTER — Encounter: Payer: Self-pay | Admitting: *Deleted

## 2021-03-21 NOTE — Telephone Encounter (Signed)
Received refill request amlodipine. Patient has not been seen at [facility] since 2018. Request denied. MyChart message to patient advising follow up with PCP, Dr. Collene Gobble for refill and BP management.

## 2021-03-30 ENCOUNTER — Ambulatory Visit (HOSPITAL_COMMUNITY): Payer: Self-pay | Admitting: Licensed Clinical Social Worker

## 2021-04-07 ENCOUNTER — Telehealth: Payer: Self-pay | Admitting: *Deleted

## 2021-04-07 ENCOUNTER — Other Ambulatory Visit: Payer: Self-pay

## 2021-04-07 ENCOUNTER — Encounter (HOSPITAL_COMMUNITY): Payer: Self-pay | Admitting: Physician Assistant

## 2021-04-07 ENCOUNTER — Ambulatory Visit (HOSPITAL_COMMUNITY)
Admission: EM | Admit: 2021-04-07 | Discharge: 2021-04-07 | Disposition: A | Discharge status: Home / Self Care | Payer: Medicaid Other | Attending: Physician Assistant | Admitting: Physician Assistant

## 2021-04-07 DIAGNOSIS — R5383 Other fatigue: Secondary | ICD-10-CM | POA: Insufficient documentation

## 2021-04-07 DIAGNOSIS — R42 Dizziness and giddiness: Secondary | ICD-10-CM | POA: Insufficient documentation

## 2021-04-07 DIAGNOSIS — E119 Type 2 diabetes mellitus without complications: Secondary | ICD-10-CM | POA: Insufficient documentation

## 2021-04-07 DIAGNOSIS — R079 Chest pain, unspecified: Secondary | ICD-10-CM | POA: Insufficient documentation

## 2021-04-07 DIAGNOSIS — R0602 Shortness of breath: Secondary | ICD-10-CM | POA: Insufficient documentation

## 2021-04-07 DIAGNOSIS — E118 Type 2 diabetes mellitus with unspecified complications: Secondary | ICD-10-CM

## 2021-04-07 LAB — CBC WITH DIFFERENTIAL/PLATELET
Abs Immature Granulocytes: 0.03 10*3/uL (ref 0.00–0.07)
Basophils Absolute: 0 10*3/uL (ref 0.0–0.1)
Basophils Relative: 0 %
Eosinophils Absolute: 0.2 10*3/uL (ref 0.0–0.5)
Eosinophils Relative: 2 %
HCT: 29.8 % — ABNORMAL LOW (ref 36.0–46.0)
Hemoglobin: 8.1 g/dL — ABNORMAL LOW (ref 12.0–15.0)
Immature Granulocytes: 0 %
Lymphocytes Relative: 37 %
Lymphs Abs: 3.4 10*3/uL (ref 0.7–4.0)
MCH: 19.8 pg — ABNORMAL LOW (ref 26.0–34.0)
MCHC: 27.2 g/dL — ABNORMAL LOW (ref 30.0–36.0)
MCV: 72.9 fL — ABNORMAL LOW (ref 80.0–100.0)
Monocytes Absolute: 0.6 10*3/uL (ref 0.1–1.0)
Monocytes Relative: 6 %
Neutro Abs: 4.9 10*3/uL (ref 1.7–7.7)
Neutrophils Relative %: 55 %
Platelets: 542 10*3/uL — ABNORMAL HIGH (ref 150–400)
RBC: 4.09 MIL/uL (ref 3.87–5.11)
RDW: 18.2 % — ABNORMAL HIGH (ref 11.5–15.5)
WBC: 9.1 10*3/uL (ref 4.0–10.5)
nRBC: 0 % (ref 0.0–0.2)

## 2021-04-07 LAB — COMPREHENSIVE METABOLIC PANEL
ALT: 15 U/L (ref 0–44)
AST: 17 U/L (ref 15–41)
Albumin: 3.4 g/dL — ABNORMAL LOW (ref 3.5–5.0)
Alkaline Phosphatase: 50 U/L (ref 38–126)
Anion gap: 3 — ABNORMAL LOW (ref 5–15)
BUN: 6 mg/dL (ref 6–20)
CO2: 24 mmol/L (ref 22–32)
Calcium: 9 mg/dL (ref 8.9–10.3)
Chloride: 111 mmol/L (ref 98–111)
Creatinine, Ser: 0.72 mg/dL (ref 0.44–1.00)
GFR, Estimated: >60 mL/min (ref >60)
Glucose, Bld: 118 mg/dL — ABNORMAL HIGH (ref 70–99)
Potassium: 4 mmol/L (ref 3.5–5.1)
Sodium: 138 mmol/L (ref 135–145)
Total Bilirubin: 0.3 mg/dL (ref 0.3–1.2)
Total Protein: 6.9 g/dL (ref 6.5–8.1)

## 2021-04-07 LAB — POCT URINALYSIS DIPSTICK, ED / UC
Bilirubin Urine: NEGATIVE
Glucose, UA: NEGATIVE mg/dL
Hgb urine dipstick: NEGATIVE
Ketones, ur: NEGATIVE mg/dL
Leukocytes,Ua: NEGATIVE
Nitrite: NEGATIVE
Protein, ur: NEGATIVE mg/dL
Specific Gravity, Urine: 1.02 (ref 1.005–1.030)
Urobilinogen, UA: 0.2 mg/dL (ref 0.0–1.0)
pH: 8.5 — ABNORMAL HIGH (ref 5.0–8.0)

## 2021-04-07 LAB — POC URINE PREG, ED: Preg Test, Ur: NEGATIVE

## 2021-04-07 LAB — CBG MONITORING, ED: Glucose-Capillary: 131 mg/dL — ABNORMAL HIGH (ref 70–99)

## 2021-04-07 MED ORDER — INSULIN ASPART 100 UNIT/ML FLEXPEN
10.0000 [IU] | PEN_INJECTOR | Freq: Two times a day (BID) | SUBCUTANEOUS | 0 refills | Status: DC
  Filled 2021-04-07: qty 6, 30d supply, fill #0
  Filled 2021-05-21: qty 6, fill #0
  Filled 2021-05-25: qty 6, 30d supply, fill #0

## 2021-04-07 MED ORDER — INSULIN GLARGINE 100 UNIT/ML SOLOSTAR PEN
20.0000 [IU] | PEN_INJECTOR | Freq: Every day | SUBCUTANEOUS | 0 refills | Status: DC
  Filled 2021-04-07 – 2021-05-25 (×3): qty 6, 30d supply, fill #0

## 2021-04-07 MED ORDER — INSULIN PEN NEEDLE 32G X 4 MM MISC
1.0000 "application " | Freq: Three times a day (TID) | 0 refills | Status: DC
  Filled 2021-04-07: qty 100, 25d supply, fill #0
  Filled 2021-05-21: qty 200, fill #0
  Filled 2021-05-25: qty 100, 25d supply, fill #0

## 2021-04-07 NOTE — Telephone Encounter (Signed)
Patient called in from UC wanting to be seen at [facility] for "heart fluttering" as there is an hour wait at [facility]. Explained there are no openings today and we close at noon. Strongly advised patient to stay at [facility] to be seen. States she will.

## 2021-04-07 NOTE — Discharge Instructions (Signed)
Your work-up is encouraging today.  We will contact you if your lab work is abnormal and we need to arrange any treatment.  Continue your diabetes medication as previously prescribed.  Every time you have an episode please monitor your blood sugars we can determine if that is contributing to your symptoms.  Make sure you drink plenty of fluid.  Eat small frequent meals.  I do recommend follow-up with cardiologist so please call to schedule an appointment.  See your PCP on Monday as scheduled.  If over the weekend you have any worsening symptoms including you pass out, have recurrent chest pain, worsening shortness of breath, nausea/vomiting interfering with oral intake you need to go to the hospital as we discussed.

## 2021-04-07 NOTE — ED Triage Notes (Signed)
Pt presents to the office today for dysuria, cough and congestion X 2-3 DAYS. Patient thinks her blood sugar is up at this time. She has not been able to check her blood sugar due to losing her meter. She has been without insulin for 2 days.

## 2021-04-07 NOTE — ED Provider Notes (Signed)
[facility]    CSN: [id number] Arrival date & time: 04/07/21  1037      History   Chief Complaint Chief Complaint  Patient presents with   Blood Sugar Problem    Pt presents to office for blood sugar with frequent urination, fatigue and achy for several days.    HPI Dana Hughes is a 40 y.o. female.   Patient presents today with a several month history of intermittent lightheadedness, chest discomfort, shortness of breath.  She is currently asymptomatic but reports having symptoms earlier today prompting evaluation.  She has not identified any triggers.  She initially thought symptoms were related to her blood sugar being elevated and she was without her diabetes medication (insulin) for several days.  She describes lightheadedness sensation as feeling as though she is going to pass out but denies any syncopal episodes.  She denies any history of arrhythmia.  She does have a history of anemia and has had an increase in bloody stools described as blood on toilet tissue due to hemorrhoids.  She is scheduled to see GI and reports that these symptoms are at baseline.  She denies any menorrhagia or additional bleeding including increased bruising.  She reports during episodes of chest discomfort is rated 6 on a 0-10 pain scale, localized to anterior chest, described as aching, no aggravating or alleviating factors identified.  She does have a history of PE and is currently anticoagulated.  She also has a history of asthma but states current symptoms are similar to previous episodes of this condition they have not responded to albuterol.  She has checked her blood sugar during episodes and reports this has been on the lower side but denies any hypoglycemia; was 97 during last episode.  Denies any recent medication changes.  Denies any recent head injury.  Denies association with physical activity.   Past Medical History:  Diagnosis Date   Anxiety    Arthritis    Asthma     Bronchitis    BV (bacterial vaginosis)    Candidiasis    Depression    Diabetes mellitus without complication ([place])    Endometriosis    Headache(784.0)    migraines   Hypertension    no longer takes meds   Migraines    Muscle spasm    Ovarian cyst    Pelvic pain in female 09/29/2015   Scoliosis    Uterine fibroid     Patient Active Problem List   Diagnosis Date Noted   UTI (urinary tract infection) 02/01/2021   Sore throat 01/31/2021   PTSD (post-traumatic stress disorder) 01/19/2021   Bipolar 2 disorder, major depressive episode ([place]) 01/11/2021   Constipation 12/10/2020   Complex ovarian cyst 12/10/2020   Atypical migraine 12/09/2020   Rectal bleeding 12/09/2020   Mild concentric left ventricular hypertrophy (LVH) 08/12/2020   History of colonic polyps 08/12/2020   Thrombocytosis 08/12/2020   History of pulmonary embolism 08/08/2020   Fibroids 05/05/2020   Iron deficiency anemia 04/08/2020   Severe episode of recurrent major depressive disorder, 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   Generalized anxiety disorder 03/11/2020   Diabetes mellitus, type 2 ([place]) 12/09/2019   Vaginal bleeding 02/20/2019   Carpal tunnel syndrome, bilateral 01/16/2019   Hypertension 07/21/2018   Vaginal discharge 05/15/2018   Endometriosis 08/30/2017    Past Surgical History:  Procedure Laterality Date   BIOPSY  08/12/2020   Procedure: BIOPSY;  Surgeon: Mauri Pole, MD;  Location: [facility] ENDOSCOPY;  Service: Endoscopy;;  CHOLECYSTECTOMY     COLONOSCOPY WITH PROPOFOL N/A 08/12/2020   Procedure: COLONOSCOPY WITH PROPOFOL;  Surgeon: Mauri Pole, MD;  Location: [facility];  Service: Endoscopy;  Laterality: N/A;   ESOPHAGOGASTRODUODENOSCOPY (EGD) WITH PROPOFOL N/A 08/12/2020   Procedure: ESOPHAGOGASTRODUODENOSCOPY (EGD) WITH PROPOFOL;  Surgeon: Mauri Pole, MD;  Location: [facility];  Service: Endoscopy;  Laterality: N/A;   POLYPECTOMY  08/12/2020   Procedure:  POLYPECTOMY;  Surgeon: Mauri Pole, MD;  Location: [facility] ENDOSCOPY;  Service: Endoscopy;;   TUBAL LIGATION      OB History     Gravida  4   Para  3   Term  3   Preterm  0   AB  1   Living  3      SAB  1   IAB  0   Ectopic  0   Multiple  0   Live Births  3        Obstetric Comments  SVD x 3          Home Medications    Prior to Admission medications   Medication Sig Start Date End Date Taking? Authorizing Provider  acetaminophen (TYLENOL) 500 MG tablet Take 2 tablets (1,000 mg total) by mouth every 6 (six) hours as needed for mild pain (or Fever >/= 101). 08/12/20   Christian, [name], MD  albuterol (PROVENTIL HFA;VENTOLIN HFA) 108 (90 Base) MCG/ACT inhaler Inhale 2 puffs into the lungs every 6 (six) hours as needed for wheezing or shortness of breath. Reported on 09/29/2015    [provider]  albuterol (PROVENTIL) (2.5 MG/3ML) 0.083% nebulizer solution Take 3 mLs (2.5 mg total) by nebulization every 6 (six) hours as needed for wheezing or shortness of breath. 07/02/18   Wurst, [place], PA-C  amoxicillin (AMOXIL) 500 MG capsule Take 1 capsule (500 mg total) by mouth daily. 02/01/21   Lacinda Axon, MD  apixaban (ELIQUIS) 5 MG TABS tablet Take 1 tablet (5 mg ) twice a day. Get Refills at [facility] 12/29/20   Virl Axe, MD  ARIPiprazole (ABILIFY) 15 MG tablet Take 1 tablet (15 mg total) by mouth daily. 01/11/21   Salley Slaughter, NP  artificial tears (LACRILUBE) OINT ophthalmic ointment Place into both eyes at bedtime as needed for dry eyes. 05/02/18   Langston Masker B, PA-C  busPIRone (BUSPAR) 10 MG tablet Take 1 tablet (10 mg total) by mouth 3 (three) times daily. 01/11/21   Salley Slaughter, NP  dicyclomine (BENTYL) 10 MG capsule Take 1 capsule (10 mg total) by mouth 4 (four) times daily as needed for spasms (abdominal pain). 06/20/20   Jose Persia, MD  ferrous sulfate 325 (65 FE) MG tablet Take 1 tablet (325  mg total) by mouth every other day. 12/14/20 08/11/21  Sanjuan Dame, MD  fluticasone (FLONASE) 50 MCG/ACT nasal spray PLACE 1 SPRAY INTO BOTH NOSTRILS IN THE MORNING AND AT BEDTIME. 06/20/20 06/20/21  Jose Persia, MD  glucose blood test strip Check your sugar in the morning before you eat breakfast, and one hour after a meal. 11/12/19   Melynda Ripple, MD  glucose monitoring kit (FREESTYLE) monitoring kit 1 each by Does not apply route daily. Check glucose once in the morning before breakfast and 1 hour after a meal 11/12/19   Melynda Ripple, MD  guaifenesin (ROBITUSSIN) 100 MG/5ML syrup Take 200 mg by mouth 3 (three) times daily as needed for cough.    [provider]  hydrOXYzine (ATARAX/VISTARIL) 25  MG tablet Take 1 tablet (25 mg total) by mouth 3 (three) times daily as needed. 01/11/21   Salley Slaughter, NP  insulin aspart (NOVOLOG) 100 UNIT/ML FlexPen Inject 10 Units into the skin 2 (two) times daily with a meal. 04/07/21   Linc Renne K, PA-C  insulin glargine (LANTUS) 100 UNIT/ML Solostar Pen Inject 20 Units into the skin daily at 10pm. 04/07/21   Zahli Vetsch K, PA-C  Insulin Pen Needle 32G X 4 MM MISC Use as directed 4 times daily. 04/07/21   Taevon Aschoff, Junie Panning K, PA-C  liraglutide (VICTOZA) 18 MG/3ML SOPN Inject 0.6 mg into the skin daily for 7 days, THEN 1.2 mg daily for 23 days. 12/12/20 01/11/21  Marianna Payment, MD  meclizine (ANTIVERT) 25 MG tablet Take 1 tablet (25 mg total) by mouth 3 (three) times daily as needed for dizziness. 05/06/18   Melynda Ripple, MD  metFORMIN (GLUCOPHAGE) 1000 MG tablet Take 1 tablet (1,000 mg total) by mouth 2 (two) times daily with a meal. 12/10/20   Marianna Payment, MD  mirtazapine (REMERON) 45 MG tablet Take 1 tablet (45 mg total) by mouth at bedtime. 01/11/21   Eulis Canner E, NP  nystatin (MYCOSTATIN) 100000 UNIT/ML suspension TAKE 5 MLS BY MOUTH FOUR TIMES DAILY FOR 7 DAYS. 06/20/20 06/20/21  Jose Persia, MD  oxyCODONE (OXY  IR/ROXICODONE) 5 MG immediate release tablet Take 1 tablet (5 mg total) by mouth 2 (two) times daily as needed for severe pain. 09/02/20   Axel Filler, MD  pantoprazole (PROTONIX) 40 MG tablet Take 1 tablet (40 mg total) by mouth daily. 08/12/20   Mitzi Hansen, MD  senna-docusate (SENOKOT S) 8.6-50 MG tablet Take 1 tablet by mouth daily. 12/14/20   Sanjuan Dame, MD  SUMAtriptan (IMITREX) 50 MG tablet Take 1 tablet (50 mg total) by mouth every 2 (two) hours as needed for migraine. May repeat in 2 hours if headache persists or recurs. Please take no more than 3 tablets (150 mg) in 24 hours. 12/10/20   Marianna Payment, MD    Family History Family History  Problem Relation Age of Onset   Migraines Mother    Cancer Father    Colon cancer Father    Lupus Paternal Grandmother    Cancer Paternal Grandmother    Breast cancer Paternal Grandmother    Migraines Maternal Aunt    Hypertension Maternal Aunt    Breast cancer Maternal Aunt    Colon cancer Paternal Aunt    Breast cancer Paternal Aunt    Ovarian cancer Cousin     Social History Social History   Tobacco Use   Smoking status: Former    Types: Cigars   Smokeless tobacco: Never   Tobacco comments:    1-2 cigars/week  Vaping Use   Vaping Use: Never used  Substance Use Topics   Alcohol use: Not Currently   Drug use: Yes    Frequency: 7.0 times per week    Types: Marijuana    Comment: 1 to 2 blunts a week     Allergies   Mushroom extract complex, Latex, and Penicillins   Review of Systems Review of Systems  Constitutional:  Positive for activity change and fatigue. Negative for appetite change and fever.  HENT:  Negative for congestion, sinus pressure, sneezing and sore throat.   Eyes:  Negative for visual disturbance.  Respiratory:  Positive for shortness of breath. Negative for cough.   Cardiovascular:  Positive for chest pain. Negative for palpitations and leg swelling.  Gastrointestinal:  Negative for  abdominal pain, diarrhea, nausea and vomiting.  Musculoskeletal:  Negative for arthralgias and myalgias.  Neurological:  Positive for light-headedness. Negative for dizziness, seizures, facial asymmetry, speech difficulty, weakness and headaches.    Physical Exam Triage Vital Signs ED Triage Vitals  Enc Vitals Group     BP 04/07/21 1203 130/76     Pulse Rate 04/07/21 1203 72     Resp 04/07/21 1203 16     Temp 04/07/21 1203 98.3 F (36.8 C)     Temp src --      SpO2 04/07/21 1203 100 %     Weight --      Height --      Head Circumference --      Peak Flow --      Pain Score 04/07/21 1204 8     Pain Loc --      Pain Edu? --      Excl. in [place]? --    No data found.  Updated Vital Signs BP 131/76 (BP Location: Left Arm)    Pulse 75 Comment: STANDING   Temp 98.3 F (36.8 C)    Resp 16    LMP 03/26/2021    SpO2 100%   Visual Acuity Right Eye Distance:   Left Eye Distance:   Bilateral Distance:    Right Eye Near:   Left Eye Near:    Bilateral Near:     Physical Exam Vitals reviewed.  Constitutional:      General: She is awake. She is not in acute distress.    Appearance: Normal appearance. She is well-developed. She is not ill-appearing.     Comments: Very pleasant female appears stated age in no acute distress sitting comfortably in exam room  HENT:     Head: Normocephalic and atraumatic. No raccoon eyes, Battle's sign or contusion.     Right Ear: Tympanic membrane, ear canal and external ear normal. No hemotympanum.     Left Ear: Tympanic membrane, ear canal and external ear normal. No hemotympanum.     Mouth/Throat:     Tongue: Tongue does not deviate from midline.     Pharynx: Uvula midline. No oropharyngeal exudate or posterior oropharyngeal erythema.  Eyes:     Extraocular Movements: Extraocular movements intact.     Conjunctiva/sclera: Conjunctivae normal.     Pupils: Pupils are equal, round, and reactive to light.  Cardiovascular:     Rate and Rhythm: Normal  rate and regular rhythm.     Heart sounds: Normal heart sounds, S1 normal and S2 normal. No murmur heard. Pulmonary:     Effort: Pulmonary effort is normal.     Breath sounds: Normal breath sounds. No wheezing, rhonchi or rales.     Comments: Clear to auscultation bilaterally Abdominal:     General: Bowel sounds are normal.     Palpations: Abdomen is soft.     Tenderness: There is no abdominal tenderness.  Musculoskeletal:     Cervical back: Normal range of motion and neck supple. Tenderness present. No bony tenderness. No spinous process tenderness or muscular tenderness.     Thoracic back: Tenderness present. No bony tenderness.     Lumbar back: Tenderness present. No bony tenderness.     Comments: Strength 5/5 bilateral lower extremities  Neurological:     General: No focal deficit present.     Cranial Nerves: Cranial nerves 2-12 are intact.     Motor: Motor function is intact.     Coordination:  Coordination is intact.     Gait: Gait is intact.  Psychiatric:        Behavior: Behavior is cooperative.     UC Treatments / Results  Labs (all labs ordered are listed, but only abnormal results are displayed) Labs Reviewed  CBG MONITORING, ED - Abnormal; Notable for the following components:      Result Value   Glucose-Capillary 131 (*)    All other components within normal limits  POCT URINALYSIS DIPSTICK, ED / UC - Abnormal; Notable for the following components:   pH 8.5 (*)    All other components within normal limits  CBC WITH DIFFERENTIAL/PLATELET  COMPREHENSIVE METABOLIC PANEL  POC URINE PREG, ED    EKG   Radiology No results found.  Procedures Procedures (including critical care time)  Medications Ordered in UC Medications - No data to display  Initial Impression / Assessment and Plan / UC Course  I have reviewed the triage vital signs and the nursing notes.  Pertinent labs & imaging results that were available during my care of the patient were reviewed by  me and considered in my medical decision making (see chart for details).     Vital signs and physical exam are reassuring today.  Patient is currently asymptomatic without current chest pain or shortness of breath.  Her glucose is appropriate.  Orthostatic vital signs were normal.  EKG obtained showed normal sinus rhythm with sinus arrhythmia and nonspecific T wave abnormality similar to 12/15/2020 tracing with early R wave progression.  No ischemic changes noted.  CBC and CMP were obtained today-results pending.  Patient was encouraged to keep all follow-up appointments with PCP and GI as previously scheduled.  She is scheduled to see her PCP on 04/10/2021.  She is out of her insulin so we will refill this but encouraged her to monitor her blood sugar with each episode to see if this is contributing to symptoms.  Recommended she drink plenty of fluid and eat small frequent meals.  Given associated chest pain will have her follow-up with cardiology and she was given contact information for local provider.  Discussed at length alarm symptoms that warrant going to the emergency room including recurrent chest pain, shortness of breath, syncopal episode.  Strict return precautions given to which she expressed understanding.  Final Clinical Impressions(s) / UC Diagnoses   Final diagnoses:  Intermittent lightheadedness  Intermittent chest pain  Fatigue, unspecified type  Shortness of breath     Discharge Instructions      Your work-up is encouraging today.  We will contact you if your lab work is abnormal and we need to arrange any treatment.  Continue your diabetes medication as previously prescribed.  Every time you have an episode please monitor your blood sugars we can determine if that is contributing to your symptoms.  Make sure you drink plenty of fluid.  Eat small frequent meals.  I do recommend follow-up with cardiologist so please call to schedule an appointment.  See your PCP on Monday as  scheduled.  If over the weekend you have any worsening symptoms including you pass out, have recurrent chest pain, worsening shortness of breath, nausea/vomiting interfering with oral intake you need to go to the hospital as we discussed.     ED Prescriptions     Medication Sig Dispense Auth. Provider   insulin aspart (NOVOLOG) 100 UNIT/ML FlexPen Inject 10 Units into the skin 2 (two) times daily with a meal. 6 mL Pascale Maves K, PA-C  insulin glargine (LANTUS) 100 UNIT/ML Solostar Pen Inject 20 Units into the skin daily at 10pm. 6 mL Darrell Hauk K, PA-C   Insulin Pen Needle 32G X 4 MM MISC Use as directed 4 times daily. 200 each Abdulhadi Stopa, Derry Skill, PA-C      PDMP not reviewed this encounter.   Terrilee Croak, PA-C 04/07/21 1336

## 2021-04-13 ENCOUNTER — Telehealth (INDEPENDENT_AMBULATORY_CARE_PROVIDER_SITE_OTHER): Payer: No Payment, Other | Admitting: Psychiatry

## 2021-04-13 ENCOUNTER — Encounter (HOSPITAL_COMMUNITY): Payer: Self-pay | Admitting: Psychiatry

## 2021-04-13 ENCOUNTER — Other Ambulatory Visit: Payer: Self-pay

## 2021-04-13 DIAGNOSIS — F1220 Cannabis dependence, uncomplicated: Secondary | ICD-10-CM | POA: Insufficient documentation

## 2021-04-13 DIAGNOSIS — F411 Generalized anxiety disorder: Secondary | ICD-10-CM | POA: Diagnosis not present

## 2021-04-13 DIAGNOSIS — F3181 Bipolar II disorder: Secondary | ICD-10-CM | POA: Diagnosis not present

## 2021-04-13 MED ORDER — ARIPIPRAZOLE 15 MG PO TABS
15.0000 mg | ORAL_TABLET | Freq: Every day | ORAL | 3 refills | Status: DC
  Filled 2021-04-13: qty 30, 30d supply, fill #0

## 2021-04-13 MED ORDER — HYDROXYZINE HCL 25 MG PO TABS
25.0000 mg | ORAL_TABLET | Freq: Three times a day (TID) | ORAL | 3 refills | Status: DC | PRN
  Filled 2021-04-13: qty 90, 30d supply, fill #0

## 2021-04-13 MED ORDER — MIRTAZAPINE 45 MG PO TABS
45.0000 mg | ORAL_TABLET | Freq: Every day | ORAL | 3 refills | Status: DC
  Filled 2021-04-13: qty 30, 30d supply, fill #0

## 2021-04-13 MED ORDER — BUSPIRONE HCL 10 MG PO TABS
10.0000 mg | ORAL_TABLET | Freq: Three times a day (TID) | ORAL | 3 refills | Status: DC
  Filled 2021-04-13: qty 90, 30d supply, fill #0

## 2021-04-13 MED ORDER — LAMOTRIGINE 25 MG PO TABS
25.0000 mg | ORAL_TABLET | Freq: Every day | ORAL | 1 refills | Status: DC
  Filled 2021-04-13: qty 30, 30d supply, fill #0

## 2021-04-13 NOTE — Progress Notes (Signed)
BH MD/PA/NP OP Progress Note Virtual Visit via Telephone Note  I connected with Dana Hughes on 04/13/21 at  3:30 PM EST by telephone and verified that I am speaking with the correct person using two identifiers.  Location: Patient: home Provider: Clinic   I discussed the limitations, risks, security and privacy concerns of performing an evaluation and management service by telephone and the availability of in person appointments. I also discussed with the patient that there may be a patient responsible charge related to this service. The patient expressed understanding and agreed to proceed.   I provided 30 minutes of non-face-to-face time during this encounter.    04/13/2021 4:05 PM Dana Hughes  MRN:  [id number]  Chief Complaint:  "I am working on doing things for me"   HPI: 40 year old female seen today for follow up psychiatric evaluation. She has a psychiatric history of depression, anxiety, and adjustment disorder.  She is currently managed on Mirtazapine 45 mg nightly, Abilify 15 mg daily, hydroxyzine 25 mg three times daily as needed, and Buspar 10 mg three times daily. She notes that her medications are somewhat effective in managing her psychiatric condition.   Today she was unable to logon virtually so assessment was done over the phone.  During exam she was pleasant, cooperative, and engaged in conversation.  She informed Probation officer that recently has been working on doing things for herself.  She notes that recently she has had several health sarea and notes that she is now trying to take her health seriously.  She notes that she has been to see a doctor about migraines, lightheadedness, sore throat, and changes in her blood pressure.  She notes that most of these things are probably exacerbated by her current life stressors.  She informed Probation officer that she broke up with her ex-boyfriend however notes that he is still in her home.  This past Sunday she notes that she lost her  job.  She did Artist that she was hired at [employer] and will start working in the new year.  She also informed Probation officer that she is concerned about her children (61 year old son, 17 year old son, and 92 year old daughter).  She also informed Probation officer that recently she has been having car issues.    Patient notes recently she has been really irritable, distractible, having racing thoughts, and fluctuations in mood.  She also notes that she has had auditory hallucinations of someone calling her name and tactile hallucinations.  She notes that she feels somewhat hands on her back.  Provider informed patient if she has been using illegal substances.  She notes that she smokes marijuana 5 times a week.  She also notes that earlier this week to try cocaine.  She notes that that was the first time she tried cocaine.  Provider informed patient that the substances can interfere with her mental health condition.  She endorsed understanding and agreed.  Patient notes she continues to be anxious and depressed.  Provider conducted a GAD-7 and patient scored a 20, last visit she scored 62.  Provider also conducted a PHQ-9 and patient scored a 14, her last visit she scored a 21.  She notes that she sleeps approximately 5 hours nightly.  She informed Probation officer that her appetite and weight fluctuates.  She informed Probation officer that she gains and loses between 10 and 15 pounds.  Clinically.    Today patient is agreeable to starting Lamictal 25 mg for 2 weeks, increasing to 50 mg 2 weeks after,  and then increasing to 75 mg.  She will follow-up with provider in a month for further evaluation and medication adjustments.  She will continue all other medications as prescribed.  Patient will follow-up with outpatient counseling for therapy.  No other concerns at this time    Visit Diagnosis:    ICD-10-CM   1. Marijuana dependence (HCC)  F12.20     2. Generalized anxiety disorder  F41.1 mirtazapine (REMERON) 45 MG tablet    busPIRone  (BUSPAR) 10 MG tablet    hydrOXYzine (ATARAX) 25 MG tablet    3. Bipolar 2 disorder, major depressive episode (HCC)  F31.81 mirtazapine (REMERON) 45 MG tablet    busPIRone (BUSPAR) 10 MG tablet    ARIPiprazole (ABILIFY) 15 MG tablet    lamoTRIgine (LAMICTAL) 25 MG tablet      Past Psychiatric History: depression, anxiety, and adjustment disorder.  Past Medical History:  Past Medical History:  Diagnosis Date   Anxiety    Arthritis    Asthma    Bronchitis    BV (bacterial vaginosis)    Candidiasis    Depression    Diabetes mellitus without complication ([place])    Endometriosis    Headache(784.0)    migraines   Hypertension    no longer takes meds   Migraines    Muscle spasm    Ovarian cyst    Pelvic pain in female 09/29/2015   Scoliosis    Uterine fibroid     Past Surgical History:  Procedure Laterality Date   BIOPSY  08/12/2020   Procedure: BIOPSY;  Surgeon: Mauri Pole, MD;  Location: [facility];  Service: Endoscopy;;   CHOLECYSTECTOMY     COLONOSCOPY WITH PROPOFOL N/A 08/12/2020   Procedure: COLONOSCOPY WITH PROPOFOL;  Surgeon: Mauri Pole, MD;  Location: [facility];  Service: Endoscopy;  Laterality: N/A;   ESOPHAGOGASTRODUODENOSCOPY (EGD) WITH PROPOFOL N/A 08/12/2020   Procedure: ESOPHAGOGASTRODUODENOSCOPY (EGD) WITH PROPOFOL;  Surgeon: Mauri Pole, MD;  Location: [facility];  Service: Endoscopy;  Laterality: N/A;   POLYPECTOMY  08/12/2020   Procedure: POLYPECTOMY;  Surgeon: Mauri Pole, MD;  Location: [facility];  Service: Endoscopy;;   TUBAL LIGATION      Family Psychiatric History: Alcohol use disorder paternal and maternal family members, siblings anxiety,   Family History:  Family History  Problem Relation Age of Onset   Migraines Mother    Cancer Father    Colon cancer Father    Lupus Paternal Grandmother    Cancer Paternal Grandmother    Breast cancer Paternal Grandmother    Migraines Maternal Aunt    Hypertension  Maternal Aunt    Breast cancer Maternal Aunt    Colon cancer Paternal Aunt    Breast cancer Paternal Aunt    Ovarian cancer Cousin     Social History:  Social History   Socioeconomic History   Marital status: Single    Spouse name: Not on file   Number of children: Not on file   Years of education: Not on file   Highest education level: Not on file  Occupational History   Not on file  Tobacco Use   Smoking status: Former    Types: Cigars   Smokeless tobacco: Never   Tobacco comments:    1-2 cigars/week  Vaping Use   Vaping Use: Never used  Substance and Sexual Activity   Alcohol use: Not Currently   Drug use: Yes    Frequency: 7.0 times per week    Types:  Marijuana    Comment: 1 to 2 blunts a week   Sexual activity: Yes    Birth control/protection: Condom, Surgical  Other Topics Concern   Not on file  Social History Narrative   Not on file   Social Determinants of Health   Financial Resource Strain: Not on file  Food Insecurity: Not on file  Transportation Needs: Not on file  Physical Activity: Not on file  Stress: Not on file  Social Connections: Not on file    Allergies:  Allergies  Allergen Reactions   Mushroom Extract Complex Anaphylaxis   Latex Itching and Swelling   Penicillins Itching    Has patient had a PCN reaction causing immediate rash, facial/tongue/throat swelling, SOB or lightheadedness with hypotension: No Has patient had a PCN reaction causing severe rash involving mucus membranes or skin necrosis: No Has patient had a PCN reaction that required hospitalization No Has patient had a PCN reaction occurring within the last 10 years: No If all of the above answers are "NO", then may proceed with Cephalosporin use.     Metabolic Disorder Labs: Lab Results  Component Value Date   HGBA1C 7.2 (A) 12/14/2020   MPG 271.87 11/12/2019   No results found for: PROLACTIN No results found for: CHOL, TRIG, HDL, CHOLHDL, VLDL, LDLCALC Lab Results   Component Value Date   TSH 1.279 05/29/2017    Therapeutic Level Labs: No results found for: LITHIUM No results found for: VALPROATE No components found for:  CBMZ  Current Medications: Current Outpatient Medications  Medication Sig Dispense Refill   lamoTRIgine (LAMICTAL) 25 MG tablet Take 1 tablet (25 mg total) by mouth daily. 90 tablet 1   acetaminophen (TYLENOL) 500 MG tablet Take 2 tablets (1,000 mg total) by mouth every 6 (six) hours as needed for mild pain (or Fever >/= 101). 30 tablet 0   albuterol (PROVENTIL HFA;VENTOLIN HFA) 108 (90 Base) MCG/ACT inhaler Inhale 2 puffs into the lungs every 6 (six) hours as needed for wheezing or shortness of breath. Reported on 09/29/2015     albuterol (PROVENTIL) (2.5 MG/3ML) 0.083% nebulizer solution Take 3 mLs (2.5 mg total) by nebulization every 6 (six) hours as needed for wheezing or shortness of breath. 75 mL 0   amoxicillin (AMOXIL) 500 MG capsule Take 1 capsule (500 mg total) by mouth daily. 10 capsule 0   apixaban (ELIQUIS) 5 MG TABS tablet Take 1 tablet (5 mg ) twice a day. Get Refills at [facility] 60 tablet 1   ARIPiprazole (ABILIFY) 15 MG tablet Take 1 tablet (15 mg total) by mouth daily. 30 tablet 3   artificial tears (LACRILUBE) OINT ophthalmic ointment Place into both eyes at bedtime as needed for dry eyes. 3.5 g 0   busPIRone (BUSPAR) 10 MG tablet Take 1 tablet (10 mg total) by mouth 3 (three) times daily. 90 tablet 3   dicyclomine (BENTYL) 10 MG capsule Take 1 capsule (10 mg total) by mouth 4 (four) times daily as needed for spasms (abdominal pain). 30 capsule 1   ferrous sulfate 325 (65 FE) MG tablet Take 1 tablet (325 mg total) by mouth every other day. 30 tablet 3   fluticasone (FLONASE) 50 MCG/ACT nasal spray PLACE 1 SPRAY INTO BOTH NOSTRILS IN THE MORNING AND AT BEDTIME. 16 g 1   glucose blood test strip Check your sugar in the morning before you eat breakfast, and one hour after a meal. 100 each 2    glucose monitoring kit (FREESTYLE)  monitoring kit 1 each by Does not apply route daily. Check glucose once in the morning before breakfast and 1 hour after a meal 1 each 0   guaifenesin (ROBITUSSIN) 100 MG/5ML syrup Take 200 mg by mouth 3 (three) times daily as needed for cough.     hydrOXYzine (ATARAX) 25 MG tablet Take 1 tablet (25 mg total) by mouth 3 (three) times daily as needed. 90 tablet 3   insulin aspart (NOVOLOG) 100 UNIT/ML FlexPen Inject 10 Units into the skin 2 (two) times daily with a meal. 6 mL 0   insulin glargine (LANTUS) 100 UNIT/ML Solostar Pen Inject 20 Units into the skin daily at 10pm. 6 mL 0   Insulin Pen Needle 32G X 4 MM MISC Use as directed 4 times daily. 200 each 0   liraglutide (VICTOZA) 18 MG/3ML SOPN Inject 0.6 mg into the skin daily for 7 days, THEN 1.2 mg daily for 23 days. 6 mL 3   meclizine (ANTIVERT) 25 MG tablet Take 1 tablet (25 mg total) by mouth 3 (three) times daily as needed for dizziness. 30 tablet 0   metFORMIN (GLUCOPHAGE) 1000 MG tablet Take 1 tablet (1,000 mg total) by mouth 2 (two) times daily with a meal. 90 tablet 3   mirtazapine (REMERON) 45 MG tablet Take 1 tablet (45 mg total) by mouth at bedtime. 30 tablet 3   nystatin (MYCOSTATIN) 100000 UNIT/ML suspension TAKE 5 MLS BY MOUTH FOUR TIMES DAILY FOR 7 DAYS. 180 mL 0   oxyCODONE (OXY IR/ROXICODONE) 5 MG immediate release tablet Take 1 tablet (5 mg total) by mouth 2 (two) times daily as needed for severe pain. 10 tablet 0   pantoprazole (PROTONIX) 40 MG tablet Take 1 tablet (40 mg total) by mouth daily. 30 tablet 0   senna-docusate (SENOKOT S) 8.6-50 MG tablet Take 1 tablet by mouth daily. 90 tablet 2   SUMAtriptan (IMITREX) 50 MG tablet Take 1 tablet (50 mg total) by mouth every 2 (two) hours as needed for migraine. May repeat in 2 hours if headache persists or recurs. Please take no more than 3 tablets (150 mg) in 24 hours. 15 tablet 0   No current facility-administered medications for this visit.      Musculoskeletal: Strength & Muscle Tone:  Unable to assess due to telephone visit Gait & Station:  Unable to assess due to telephone visit Patient leans: N/A  Psychiatric Specialty Exam: Review of Systems  Last menstrual period 03/26/2021.There is no height or weight on file to calculate BMI.  General Appearance:  Unable to assess due to telephone visit  Eye Contact:   Unable to assess due to telephone visit  Speech:  Clear and Coherent and Normal Rate  Volume:  Normal  Mood:  Anxious, Depressed and Irritable  Affect:  Congruent  Thought Process:  Coherent, Goal Directed and Linear  Orientation:  Full (Time, Place, and Person)  Thought Content: Logical and Hallucinations: Auditory Tactile Visual   Suicidal Thoughts:  Yes.  without intent/plan  Homicidal Thoughts:  Yes.  without intent/plan  Memory:  Immediate;   Good Recent;   Good Remote;   Good  Judgement:  Good  Insight:  Good  Psychomotor Activity:  Normal  Concentration:  Concentration: Good and Attention Span: Good  Recall:  Good  Fund of Knowledge: Good  Language: Good  Akathisia:  No  Handed:  Right  AIMS (if indicated): Not done  Assets:  Communication Skills Desire for Improvement Financial Resources/Insurance Housing Intimacy Social Support  ADL's:  Intact  Cognition: WNL  Sleep:  Fair   Screenings: GAD-7    Flowsheet Row Video Visit from 04/13/2021 in [facility] Counselor from 01/19/2021 in [facility] Video Visit from 01/11/2021 in [facility] Video Visit from 10/11/2020 in [facility] Video Visit from 06/03/2020 in [facility]  Total GAD-7 Score 20 16 19 19 19       PHQ2-9    Flowsheet Row Video Visit from 04/13/2021 in [facility] Counselor from 01/19/2021 in [facility] Video Visit from  01/11/2021 in [facility] Office Visit from 12/29/2020 in [place] Office Visit from 12/09/2020 in [place]  PHQ-2 Total Score 2 4 6  0 4  PHQ-9 Total Score 14 17 21  0 13      Flowsheet Row Video Visit from 04/13/2021 in [facility] ED from 04/07/2021 in [facility] from 01/19/2021 in [place] Error: Q7 should not be populated when Q6 is No No Risk No Risk        Assessment and Plan: Patient endorses symptoms of substance use (marijuana use and recently cocaine) anxiety,depression, hypomania, and psychosis.  Today patient is agreeable to starting Lamictal 25 mg for 2 weeks, increasing to 50 mg 2 weeks after, and then increasing to 75 mg.  She will follow-up with provider in a month for further evaluation and medication adjustments.  She will continue all other medications as prescribed.      1. Generalized anxiety disorder  Continue- mirtazapine (REMERON) 45 MG tablet; Take 1 tablet (45 mg total) by mouth at bedtime.  Dispense: 30 tablet; Refill: 3 Continue- busPIRone (BUSPAR) 10 MG tablet; Take 1 tablet (10 mg total) by mouth 3 (three) times daily.  Dispense: 90 tablet; Refill: 3 Continue- hydrOXYzine (ATARAX) 25 MG tablet; Take 1 tablet (25 mg total) by mouth 3 (three) times daily as needed.  Dispense: 90 tablet; Refill: 3  2. Bipolar 2 disorder, major depressive episode (HCC)  Continue- mirtazapine (REMERON) 45 MG tablet; Take 1 tablet (45 mg total) by mouth at bedtime.  Dispense: 30 tablet; Refill: 3 Continue- busPIRone (BUSPAR) 10 MG tablet; Take 1 tablet (10 mg total) by mouth 3 (three) times daily.  Dispense: 90 tablet; Refill: 3 Continue ARIPiprazole (ABILIFY) 15 MG tablet; Take 1 tablet (15 mg total) by mouth daily.  Dispense: 30 tablet; Refill: 3 Start- lamoTRIgine (LAMICTAL) 25 MG  tablet; Take 1 tablet (25 mg total) by mouth daily.  Dispense: 90 tablet; Refill: 1 3. Marijuana dependence ([place])    Follow up in 3 months Follow up with therapy  Salley Slaughter, NP 04/13/2021, 4:05 PM

## 2021-04-14 ENCOUNTER — Other Ambulatory Visit: Payer: Self-pay

## 2021-04-20 ENCOUNTER — Other Ambulatory Visit: Payer: Self-pay

## 2021-05-16 ENCOUNTER — Other Ambulatory Visit: Payer: Self-pay

## 2021-05-16 ENCOUNTER — Encounter (HOSPITAL_COMMUNITY): Payer: Self-pay | Admitting: Psychiatry

## 2021-05-16 ENCOUNTER — Telehealth (INDEPENDENT_AMBULATORY_CARE_PROVIDER_SITE_OTHER): Payer: No Payment, Other | Admitting: Psychiatry

## 2021-05-16 DIAGNOSIS — F411 Generalized anxiety disorder: Secondary | ICD-10-CM

## 2021-05-16 DIAGNOSIS — F3181 Bipolar II disorder: Secondary | ICD-10-CM

## 2021-05-16 MED ORDER — MIRTAZAPINE 45 MG PO TABS
45.0000 mg | ORAL_TABLET | Freq: Every day | ORAL | 3 refills | Status: DC
  Filled 2021-05-16 – 2021-06-14 (×2): qty 30, 30d supply, fill #0

## 2021-05-16 MED ORDER — LAMOTRIGINE 100 MG PO TABS
100.0000 mg | ORAL_TABLET | Freq: Every day | ORAL | 3 refills | Status: DC
  Filled 2021-05-16 – 2021-06-14 (×2): qty 30, 30d supply, fill #0

## 2021-05-16 MED ORDER — HYDROXYZINE HCL 25 MG PO TABS
25.0000 mg | ORAL_TABLET | Freq: Three times a day (TID) | ORAL | 3 refills | Status: DC | PRN
  Filled 2021-05-16 – 2021-06-14 (×2): qty 90, 30d supply, fill #0

## 2021-05-16 MED ORDER — BUSPIRONE HCL 10 MG PO TABS
10.0000 mg | ORAL_TABLET | Freq: Three times a day (TID) | ORAL | 3 refills | Status: DC
  Filled 2021-05-16 – 2021-06-14 (×2): qty 90, 30d supply, fill #0

## 2021-05-16 MED ORDER — ARIPIPRAZOLE 15 MG PO TABS
15.0000 mg | ORAL_TABLET | Freq: Every day | ORAL | 3 refills | Status: DC
  Filled 2021-05-16 – 2021-06-14 (×2): qty 30, 30d supply, fill #0

## 2021-05-16 NOTE — Progress Notes (Signed)
[place] MD/PA/NP OP Progress Note Virtual Visit via Telephone Note  I connected with Dana Hughes on 05/16/21 at 10:30 AM EST by telephone and verified that I am speaking with the correct person using two identifiers.  Location: Patient: home Provider: Clinic   I discussed the limitations, risks, security and privacy concerns of performing an evaluation and management service by telephone and the availability of in person appointments. I also discussed with the patient that there may be a patient responsible charge related to this service. The patient expressed understanding and agreed to proceed.   I provided 30 minutes of non-face-to-face time during this encounter.    05/16/2021 10:53 AM Dana Hughes  MRN:  [id number]  Chief Complaint:  "I am doing okay"   HPI: 41 year old female seen today for follow up psychiatric evaluation. She has a psychiatric history of depression, anxiety, and adjustment disorder.  She is currently managed on Mirtazapine 45 mg nightly, Abilify 15 mg daily, hydroxyzine 25 mg three times daily as needed, Buspar 10 mg three times daily, Lamictal 75 mg daily.  She notes that she just increased her Lamictal to 75 mg today.  She notes that her medications are somewhat effective in managing her psychiatric condition.   Today she was unable to logon virtually so assessment was done over the phone.  During exam she was pleasant, cooperative, and engaged in conversation.  She informed Probation officer that she has been doing good.  She reports that she continues to work at [employer] and finds enjoyment in her job.  Patient notes that since starting Lamictal her mood has somewhat improved.  She notes that she is less irritable and distractible.  She still notes that she has racing thoughts and auditory/tactile hallucinations.  She reports that she hears people calling her name and feel that someone is tapping on her shoulder.  Provider asked patient if she continues to use cocaine and  marijuana.  She notes that she does indulge in marijuana.  Provider informed patient that marijuana can exacerbate her hallucinations and irritability.  She endorsed understanding.    Since her last visit she reports her anxiety and depression are somewhat better.  Provider conducted a GAD-7 and patient scored a 17, at her last visit she scored a 20.  Provider also conducted PHQ-9 and patient scored a 16, at her last visit she scored a 14.  She notes that she sleeps approximately 5 hours nightly and has a good appetite.  Today she denies SI/HI/VA or paranoia.  Patient notes that she has chronic back pain from scoliosis.  She notes that Advil is somewhat effective in managing her pain.  Today patient agreeable to increasing Lamictal 75 mg to 100 mg to help manage mood.  Provider recommended patient reduce/discontinue marijuana consumption to help manage psychosis.  Patient requested other medications not be adjusted.  In future patient may consider gabapentin to help manage anxiety, mood, and pain.  She will follow-up with outpatient counseling for therapy.  No other concerns at this time      Visit Diagnosis:    ICD-10-CM   1. Bipolar 2 disorder, major depressive episode (HCC)  F31.81 lamoTRIgine (LAMICTAL) 100 MG tablet    busPIRone (BUSPAR) 10 MG tablet    ARIPiprazole (ABILIFY) 15 MG tablet    mirtazapine (REMERON) 45 MG tablet    2. Generalized anxiety disorder  F41.1 hydrOXYzine (ATARAX) 25 MG tablet    busPIRone (BUSPAR) 10 MG tablet    mirtazapine (REMERON) 45 MG tablet  Past Psychiatric History: depression, anxiety, and adjustment disorder.  Past Medical History:  Past Medical History:  Diagnosis Date   Anxiety    Arthritis    Asthma    Bronchitis    BV (bacterial vaginosis)    Candidiasis    Depression    Diabetes mellitus without complication ([place])    Endometriosis    Headache(784.0)    migraines   Hypertension    no longer takes meds   Migraines    Muscle  spasm    Ovarian cyst    Pelvic pain in female 09/29/2015   Scoliosis    Uterine fibroid     Past Surgical History:  Procedure Laterality Date   BIOPSY  08/12/2020   Procedure: BIOPSY;  Surgeon: Mauri Pole, MD;  Location: [facility];  Service: Endoscopy;;   CHOLECYSTECTOMY     COLONOSCOPY WITH PROPOFOL N/A 08/12/2020   Procedure: COLONOSCOPY WITH PROPOFOL;  Surgeon: Mauri Pole, MD;  Location: [facility];  Service: Endoscopy;  Laterality: N/A;   ESOPHAGOGASTRODUODENOSCOPY (EGD) WITH PROPOFOL N/A 08/12/2020   Procedure: ESOPHAGOGASTRODUODENOSCOPY (EGD) WITH PROPOFOL;  Surgeon: Mauri Pole, MD;  Location: [facility];  Service: Endoscopy;  Laterality: N/A;   POLYPECTOMY  08/12/2020   Procedure: POLYPECTOMY;  Surgeon: Mauri Pole, MD;  Location: [facility];  Service: Endoscopy;;   TUBAL LIGATION      Family Psychiatric History: Alcohol use disorder paternal and maternal family members, siblings anxiety,   Family History:  Family History  Problem Relation Age of Onset   Migraines Mother    Cancer Father    Colon cancer Father    Lupus Paternal Grandmother    Cancer Paternal Grandmother    Breast cancer Paternal Grandmother    Migraines Maternal Aunt    Hypertension Maternal Aunt    Breast cancer Maternal Aunt    Colon cancer Paternal Aunt    Breast cancer Paternal Aunt    Ovarian cancer Cousin     Social History:  Social History   Socioeconomic History   Marital status: Single    Spouse name: Not on file   Number of children: Not on file   Years of education: Not on file   Highest education level: Not on file  Occupational History   Not on file  Tobacco Use   Smoking status: Former    Types: Cigars   Smokeless tobacco: Never   Tobacco comments:    1-2 cigars/week  Vaping Use   Vaping Use: Never used  Substance and Sexual Activity   Alcohol use: Not Currently   Drug use: Yes    Frequency: 7.0 times per week    Types: Marijuana     Comment: 1 to 2 blunts a week   Sexual activity: Yes    Birth control/protection: Condom, Surgical  Other Topics Concern   Not on file  Social History Narrative   Not on file   Social Determinants of Health   Financial Resource Strain: Not on file  Food Insecurity: Not on file  Transportation Needs: Not on file  Physical Activity: Not on file  Stress: Not on file  Social Connections: Not on file    Allergies:  Allergies  Allergen Reactions   Mushroom Extract Complex Anaphylaxis   Latex Itching and Swelling   Penicillins Itching    Has patient had a PCN reaction causing immediate rash, facial/tongue/throat swelling, SOB or lightheadedness with hypotension: No Has patient had a PCN reaction causing severe rash involving mucus membranes  or skin necrosis: No Has patient had a PCN reaction that required hospitalization No Has patient had a PCN reaction occurring within the last 10 years: No If all of the above answers are "NO", then may proceed with Cephalosporin use.     Metabolic Disorder Labs: Lab Results  Component Value Date   HGBA1C 7.2 (A) 12/14/2020   MPG 271.87 11/12/2019   No results found for: PROLACTIN No results found for: CHOL, TRIG, HDL, CHOLHDL, VLDL, LDLCALC Lab Results  Component Value Date   TSH 1.279 05/29/2017    Therapeutic Level Labs: No results found for: LITHIUM No results found for: VALPROATE No components found for:  CBMZ  Current Medications: Current Outpatient Medications  Medication Sig Dispense Refill   acetaminophen (TYLENOL) 500 MG tablet Take 2 tablets (1,000 mg total) by mouth every 6 (six) hours as needed for mild pain (or Fever >/= 101). 30 tablet 0   albuterol (PROVENTIL HFA;VENTOLIN HFA) 108 (90 Base) MCG/ACT inhaler Inhale 2 puffs into the lungs every 6 (six) hours as needed for wheezing or shortness of breath. Reported on 09/29/2015     albuterol (PROVENTIL) (2.5 MG/3ML) 0.083% nebulizer solution Take 3 mLs (2.5 mg total) by  nebulization every 6 (six) hours as needed for wheezing or shortness of breath. 75 mL 0   amoxicillin (AMOXIL) 500 MG capsule Take 1 capsule (500 mg total) by mouth daily. 10 capsule 0   apixaban (ELIQUIS) 5 MG TABS tablet Take 1 tablet (5 mg ) twice a day. Get Refills at [facility] 60 tablet 1   ARIPiprazole (ABILIFY) 15 MG tablet Take 1 tablet (15 mg total) by mouth daily. 30 tablet 3   artificial tears (LACRILUBE) OINT ophthalmic ointment Place into both eyes at bedtime as needed for dry eyes. 3.5 g 0   busPIRone (BUSPAR) 10 MG tablet Take 1 tablet (10 mg total) by mouth 3 (three) times daily. 90 tablet 3   dicyclomine (BENTYL) 10 MG capsule Take 1 capsule (10 mg total) by mouth 4 (four) times daily as needed for spasms (abdominal pain). 30 capsule 1   ferrous sulfate 325 (65 FE) MG tablet Take 1 tablet (325 mg total) by mouth every other day. 30 tablet 3   fluticasone (FLONASE) 50 MCG/ACT nasal spray PLACE 1 SPRAY INTO BOTH NOSTRILS IN THE MORNING AND AT BEDTIME. 16 g 1   glucose blood test strip Check your sugar in the morning before you eat breakfast, and one hour after a meal. 100 each 2   glucose monitoring kit (FREESTYLE) monitoring kit 1 each by Does not apply route daily. Check glucose once in the morning before breakfast and 1 hour after a meal 1 each 0   guaifenesin (ROBITUSSIN) 100 MG/5ML syrup Take 200 mg by mouth 3 (three) times daily as needed for cough.     hydrOXYzine (ATARAX) 25 MG tablet Take 1 tablet (25 mg total) by mouth 3 (three) times daily as needed. 90 tablet 3   insulin aspart (NOVOLOG) 100 UNIT/ML FlexPen Inject 10 Units into the skin 2 (two) times daily with a meal. 6 mL 0   insulin glargine (LANTUS) 100 UNIT/ML Solostar Pen Inject 20 Units into the skin daily at 10pm. 6 mL 0   Insulin Pen Needle 32G X 4 MM MISC Use as directed 4 times daily. 200 each 0   lamoTRIgine (LAMICTAL) 100 MG tablet Take 1 tablet (100 mg total) by mouth daily. 30  tablet 3   liraglutide (VICTOZA)  18 MG/3ML SOPN Inject 0.6 mg into the skin daily for 7 days, THEN 1.2 mg daily for 23 days. 6 mL 3   meclizine (ANTIVERT) 25 MG tablet Take 1 tablet (25 mg total) by mouth 3 (three) times daily as needed for dizziness. 30 tablet 0   metFORMIN (GLUCOPHAGE) 1000 MG tablet Take 1 tablet (1,000 mg total) by mouth 2 (two) times daily with a meal. 90 tablet 3   mirtazapine (REMERON) 45 MG tablet Take 1 tablet (45 mg total) by mouth at bedtime. 30 tablet 3   nystatin (MYCOSTATIN) 100000 UNIT/ML suspension TAKE 5 MLS BY MOUTH FOUR TIMES DAILY FOR 7 DAYS. 180 mL 0   oxyCODONE (OXY IR/ROXICODONE) 5 MG immediate release tablet Take 1 tablet (5 mg total) by mouth 2 (two) times daily as needed for severe pain. 10 tablet 0   pantoprazole (PROTONIX) 40 MG tablet Take 1 tablet (40 mg total) by mouth daily. 30 tablet 0   senna-docusate (SENOKOT S) 8.6-50 MG tablet Take 1 tablet by mouth daily. 90 tablet 2   SUMAtriptan (IMITREX) 50 MG tablet Take 1 tablet (50 mg total) by mouth every 2 (two) hours as needed for migraine. May repeat in 2 hours if headache persists or recurs. Please take no more than 3 tablets (150 mg) in 24 hours. 15 tablet 0   No current facility-administered medications for this visit.     Musculoskeletal: Strength & Muscle Tone:  Unable to assess due to telephone visit Gait & Station:  Unable to assess due to telephone visit Patient leans: N/A  Psychiatric Specialty Exam: Review of Systems  There were no vitals taken for this visit.There is no height or weight on file to calculate BMI.  General Appearance:  Unable to assess due to telephone visit  Eye Contact:   Unable to assess due to telephone visit  Speech:  Clear and Coherent and Normal Rate  Volume:  Normal  Mood:  Anxious and Depressed  Affect:  Congruent  Thought Process:  Coherent, Goal Directed and Linear  Orientation:  Full (Time, Place, and Person)  Thought Content: Logical and  Hallucinations: Auditory Tactile   Suicidal Thoughts:  No  Homicidal Thoughts:  No  Memory:  Immediate;   Good Recent;   Good Remote;   Good  Judgement:  Good  Insight:  Good  Psychomotor Activity:  Normal  Concentration:  Concentration: Good and Attention Span: Good  Recall:  Good  Fund of Knowledge: Good  Language: Good  Akathisia:  No  Handed:  Right  AIMS (if indicated): Not done  Assets:  Communication Skills Desire for Improvement Financial Resources/Insurance Housing Intimacy Social Support  ADL's:  Intact  Cognition: WNL  Sleep:  Fair   Screenings: GAD-7    Flowsheet Row Video Visit from 05/16/2021 in [facility] Video Visit from 04/13/2021 in [facility] Counselor from 01/19/2021 in [facility] Video Visit from 01/11/2021 in [facility] Video Visit from 10/11/2020 in [facility]  Total GAD-7 Score _0 [id number]    Flowsheet Row Video Visit from 05/16/2021 in [facility] Video Visit from 04/13/2021 in [facility] Counselor from 01/19/2021 in [facility] Video Visit from 01/11/2021 in [facility] Office Visit from 12/29/2020 in [facility]  PHQ-2 Total Score 5 2 4  6 0  PHQ-9 Total Score _0 0      Flowsheet Row Video Visit from 05/16/2021 in [facility] Video Visit from 04/13/2021 in [facility] ED from 04/07/2021 in [facility] Error: Question 6 not populated Error: Q7 should not be populated when Q6 is No No Risk        Assessment and Plan: Patient endorses symptoms of substance use (marijuana use) anxiety,depression, and psychosis.  Today patient agreeable to  increasing Lamictal 75 mg to 100 mg to help manage mood.  Provider recommended patient reduce/discontinue marijuana consumption to help manage psychosis.  Patient requested other medications not be adjusted.  In future patient may consider gabapentin to help manage anxiety, mood, and pain.  She will follow-up with outpatient counseling for therapy   1. Generalized anxiety disorder  Continue- mirtazapine (REMERON) 45 MG tablet; Take 1 tablet (45 mg total) by mouth at bedtime.  Dispense: 30 tablet; Refill: 3 Continue- busPIRone (BUSPAR) 10 MG tablet; Take 1 tablet (10 mg total) by mouth 3 (three) times daily.  Dispense: 90 tablet; Refill: 3 Continue- hydrOXYzine (ATARAX) 25 MG tablet; Take 1 tablet (25 mg total) by mouth 3 (three) times daily as needed.  Dispense: 90 tablet; Refill: 3  2. Bipolar 2 disorder, major depressive episode (HCC)  Continue- mirtazapine (REMERON) 45 MG tablet; Take 1 tablet (45 mg total) by mouth at bedtime.  Dispense: 30 tablet; Refill: 3 Continue- busPIRone (BUSPAR) 10 MG tablet; Take 1 tablet (10 mg total) by mouth 3 (three) times daily.  Dispense: 90 tablet; Refill: 3 Continue ARIPiprazole (ABILIFY) 15 MG tablet; Take 1 tablet (15 mg total) by mouth daily.  Dispense: 30 tablet; Refill: 3 Continue- lamoTRIgine (LAMICTAL) 100 MG tablet; Take 1 tablet (100 mg total) by mouth daily.  Dispense: 30 tablet; Refill: 3 3. Marijuana dependence ([place])    Follow up in 3 months Follow up with therapy  Salley Slaughter, NP 05/16/2021, 10:53 AM

## 2021-05-23 ENCOUNTER — Other Ambulatory Visit: Payer: Self-pay

## 2021-05-25 ENCOUNTER — Other Ambulatory Visit: Payer: Self-pay

## 2021-06-01 ENCOUNTER — Other Ambulatory Visit: Payer: Self-pay

## 2021-06-05 ENCOUNTER — Encounter: Payer: Medicaid Other | Admitting: Student

## 2021-06-14 ENCOUNTER — Other Ambulatory Visit (HOSPITAL_COMMUNITY): Payer: Self-pay

## 2021-06-14 ENCOUNTER — Other Ambulatory Visit: Payer: Self-pay

## 2021-06-14 ENCOUNTER — Encounter: Payer: Medicaid Other | Admitting: Student

## 2021-06-14 MED FILL — Nystatin Susp 100000 Unit/ML: OROMUCOSAL | Qty: 180 | Fill #0 | Status: CN

## 2021-06-15 ENCOUNTER — Encounter: Payer: Self-pay | Admitting: Student

## 2021-06-16 ENCOUNTER — Other Ambulatory Visit: Payer: Self-pay

## 2021-06-16 ENCOUNTER — Other Ambulatory Visit (HOSPITAL_COMMUNITY): Payer: Self-pay

## 2021-06-22 ENCOUNTER — Ambulatory Visit (INDEPENDENT_AMBULATORY_CARE_PROVIDER_SITE_OTHER): Payer: Self-pay | Admitting: Student

## 2021-06-22 ENCOUNTER — Encounter: Payer: Self-pay | Admitting: Student

## 2021-06-22 VITALS — BP 140/106 | HR 83 | Ht 63.0 in | Wt 216.0 lb

## 2021-06-22 DIAGNOSIS — R45850 Homicidal ideations: Secondary | ICD-10-CM

## 2021-06-22 DIAGNOSIS — I517 Cardiomegaly: Secondary | ICD-10-CM

## 2021-06-22 DIAGNOSIS — E119 Type 2 diabetes mellitus without complications: Secondary | ICD-10-CM

## 2021-06-22 DIAGNOSIS — Z86711 Personal history of pulmonary embolism: Secondary | ICD-10-CM

## 2021-06-22 DIAGNOSIS — R0609 Other forms of dyspnea: Secondary | ICD-10-CM

## 2021-06-22 DIAGNOSIS — R0781 Pleurodynia: Secondary | ICD-10-CM

## 2021-06-22 DIAGNOSIS — D509 Iron deficiency anemia, unspecified: Secondary | ICD-10-CM

## 2021-06-22 LAB — HEMOGLOBIN A1C
Hgb A1c MFr Bld: 6.9 % — ABNORMAL HIGH (ref 4.8–5.6)
Mean Plasma Glucose: 151.33 mg/dL

## 2021-06-22 LAB — BASIC METABOLIC PANEL
Anion gap: 7 (ref 5–15)
BUN: 5 mg/dL — ABNORMAL LOW (ref 6–20)
CO2: 27 mmol/L (ref 22–32)
Calcium: 9.1 mg/dL (ref 8.9–10.3)
Chloride: 108 mmol/L (ref 98–111)
Creatinine, Ser: 0.63 mg/dL (ref 0.44–1.00)
GFR, Estimated: >60 mL/min (ref >60)
Glucose, Bld: 115 mg/dL — ABNORMAL HIGH (ref 70–99)
Potassium: 4.1 mmol/L (ref 3.5–5.1)
Sodium: 142 mmol/L (ref 135–145)

## 2021-06-22 LAB — CBC
HCT: 34.4 % — ABNORMAL LOW (ref 36.0–46.0)
Hemoglobin: 9.3 g/dL — ABNORMAL LOW (ref 12.0–15.0)
MCH: 20.4 pg — ABNORMAL LOW (ref 26.0–34.0)
MCHC: 27 g/dL — ABNORMAL LOW (ref 30.0–36.0)
MCV: 75.4 fL — ABNORMAL LOW (ref 80.0–100.0)
Platelets: 528 10*3/uL — ABNORMAL HIGH (ref 150–400)
RBC: 4.56 MIL/uL (ref 3.87–5.11)
RDW: 19.1 % — ABNORMAL HIGH (ref 11.5–15.5)
WBC: 8.9 10*3/uL (ref 4.0–10.5)
nRBC: 0 % (ref 0.0–0.2)

## 2021-06-22 LAB — BRAIN NATRIURETIC PEPTIDE: B Natriuretic Peptide: 61.8 pg/mL (ref 0.0–100.0)

## 2021-06-22 NOTE — Patient Instructions (Signed)
[name], it was a pleasure seeing you today! ? ?Today we discussed: ?- Shortness of breath: I believe this could be related to your heart failure. I am going to get blood work on you today and will call you with the results tomorrow. ? ?- Rib pain: For this I would continue topical medications, such as lidocaine patch or voltaren gel. These are both available over the counter. ? ?- Homicidal ideation: I would like for you to see psychiatry as soon as possible. Please continue taking your medications as prescribed.  ? ?I have ordered the following labs today: ? ?Lab Orders    ?     BMP8+Anion Gap    ?     Hemoglobin A1c    ?     Brain natriuretic peptide    ?     CBC no Diff    ?  ? ?Follow-up:  1 week   ? ?Please make sure to arrive 15 minutes prior to your next appointment. If you arrive late, you may be asked to reschedule.  ? ?We look forward to seeing you next time. Please call our clinic at [phone] if you have any questions or concerns. The best time to call is Monday-Friday from 9am-4pm, but there is someone available 24/7. If after hours or the weekend, call the main hospital number and ask for the Internal Medicine Resident On-Call. If you need medication refills, please notify your pharmacy one week in advance and they will send [place] a request. ? ?Thank you for letting us take part in your care. Wishing you the best! ? ?Thank you, ?Sanjuan Dame, MD ? ?

## 2021-06-24 DIAGNOSIS — R0609 Other forms of dyspnea: Secondary | ICD-10-CM | POA: Insufficient documentation

## 2021-06-24 DIAGNOSIS — R45850 Homicidal ideations: Secondary | ICD-10-CM | POA: Insufficient documentation

## 2021-06-24 DIAGNOSIS — R0781 Pleurodynia: Secondary | ICD-10-CM | POA: Insufficient documentation

## 2021-06-24 HISTORY — DX: Other forms of dyspnea: R06.09
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
## 2021-06-24 NOTE — Assessment & Plan Note (Signed)
Dana Hughes has been experiencing pain on the right side of her rib cage for roughly the last year. This initially started after having extensive pulmonary emboli in April of 2022. She was requiring oxycodone acutely last year, but has been controlled with over the counter medications. No new injuries or trauma to the area. Discussed with [name] she can continue taking Tylenol as needed along with topical agents, such as voltaren gel and lidocaine patches. ?

## 2021-06-24 NOTE — Assessment & Plan Note (Signed)
Patient had extensive bilateral pulmonary emboli in April of 2022, thought to be provoked due to Megace. Patient had completed at least six months of therapeutic anticoagulation with Eliquis. At this juncture, patient is no longer taking Megace or Eliquis and has no signs or symptoms of DVT/PE at this time. Will hold off resuming Eliquis at this time. ?

## 2021-06-24 NOTE — Assessment & Plan Note (Signed)
Unfortunately due to time constraints, we were unable to discuss diabetes further. We will obtain an A1c and further discuss this at next week's visit. ?
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
## 2021-06-24 NOTE — Assessment & Plan Note (Signed)
Patient reports she continues to have significant picca, including eating tissues, dirt, and ice. States she was only recently able to start the iron supplementation and she has not had any side effects to this. We will repeat CBC today, but will hold off repeat iron studies until she is able to consistently take the oral iron supplementation. ? ?- Continue ferrous sulfate '325mg'$  every other day ?- CBC today ?- Repeat iron studies in 2-3 months ?

## 2021-06-24 NOTE — Assessment & Plan Note (Signed)
Dana Hughes is presenting to clinic today to discuss recent dyspnea on exertion. Reports this started roughly two months ago and has progressively been worsening. She states she has always had some degree of dyspnea, but this has worsened. Patient mentions that she has had multiple episodes of PND over the last few months as well. She denies chest pain, hemoptysis, coughing, fevers, chills. She chronically sleeps with four pillows due to chronic back pain - this has not changed recently. Patient has not been taking apixaban for the last couple of months as she has not been able to find this. Patient has not had recent changes in diet and reports compliance with medications (except apixaban).  ? ?On exam, patient has normal HR, mildly hypertensive, sating well on room air. She appears euvolemic given no JVD, no lower extremity swelling, and bilateral lungs clear to ausculation. Patient has had ~10lb weight increase from previous visit. In addition, patient has a history of bilateral pulmonary emboli 07/2020 likely provoked 2/2 Megace. Current clinical picture does not correlate with pulmonary emobli currently, but cannot rule out. In addition, patient has previous significant history of iron deficiency anemia complicated by blood loss anemia 2/2 Eliquis last year.  ? ?History concerning for acute heart failure, although appears euvolemic on exam. Patient also has a significant history of symptomatic anemia. At this juncture we will obtain BMP, CBC, and BNP to further evaluate. If BNP elevated will likely need diuretics and possibly repeat Echo. If lab work does not reveal a cause, can consider chest x-ray and PFT's when she returns in one week. ? ?- Follow-up BMP, CBC, BNP ?- If BNP elevated will give diuretics ?- Return to clinic in one week ? ?ADDENDUM: ?- BNP normal at 60, renal function at baseline, Hgb improved from previous lab. Will hold off diuretics at this time, will re-evaluate next week. ?

## 2021-06-26 NOTE — Progress Notes (Signed)
Internal Medicine Clinic Attending ? ?Case discussed with Dr. Braswell  At the time of the visit.  We reviewed the resident?s history and exam and pertinent patient test results.  I agree with the assessment, diagnosis, and plan of care documented in the resident?s note.  ?

## 2021-06-29 ENCOUNTER — Encounter: Payer: Medicaid Other | Admitting: Student

## 2021-06-29 ENCOUNTER — Encounter: Payer: Self-pay | Admitting: Student

## 2021-07-03 ENCOUNTER — Ambulatory Visit (INDEPENDENT_AMBULATORY_CARE_PROVIDER_SITE_OTHER): Payer: Self-pay | Admitting: Obstetrics and Gynecology

## 2021-07-03 ENCOUNTER — Other Ambulatory Visit (HOSPITAL_COMMUNITY)
Admission: RE | Admit: 2021-07-03 | Discharge: 2021-07-03 | Disposition: A | Discharge status: Home / Self Care | Payer: Medicaid Other | Source: Ambulatory Visit | Attending: Obstetrics and Gynecology | Admitting: Obstetrics and Gynecology

## 2021-07-03 ENCOUNTER — Other Ambulatory Visit: Payer: Self-pay

## 2021-07-03 ENCOUNTER — Encounter: Payer: Self-pay | Admitting: Obstetrics and Gynecology

## 2021-07-03 VITALS — BP 133/88 | HR 102 | Ht 63.5 in | Wt 212.2 lb

## 2021-07-03 DIAGNOSIS — D219 Benign neoplasm of connective and other soft tissue, unspecified: Secondary | ICD-10-CM

## 2021-07-03 DIAGNOSIS — Z1231 Encounter for screening mammogram for malignant neoplasm of breast: Secondary | ICD-10-CM | POA: Insufficient documentation

## 2021-07-03 DIAGNOSIS — Z9851 Tubal ligation status: Secondary | ICD-10-CM

## 2021-07-03 DIAGNOSIS — D259 Leiomyoma of uterus, unspecified: Secondary | ICD-10-CM

## 2021-07-03 DIAGNOSIS — N939 Abnormal uterine and vaginal bleeding, unspecified: Secondary | ICD-10-CM

## 2021-07-03 DIAGNOSIS — N898 Other specified noninflammatory disorders of vagina: Secondary | ICD-10-CM

## 2021-07-03 DIAGNOSIS — R32 Unspecified urinary incontinence: Secondary | ICD-10-CM | POA: Insufficient documentation

## 2021-07-03 DIAGNOSIS — N8110 Cystocele, unspecified: Secondary | ICD-10-CM | POA: Insufficient documentation

## 2021-07-03 DIAGNOSIS — N3946 Mixed incontinence: Secondary | ICD-10-CM

## 2021-07-03 DIAGNOSIS — N926 Irregular menstruation, unspecified: Secondary | ICD-10-CM | POA: Insufficient documentation

## 2021-07-03 DIAGNOSIS — N809 Endometriosis, unspecified: Secondary | ICD-10-CM

## 2021-07-03 NOTE — Progress Notes (Signed)
Irregular menses, maybe 2 menses a month, sometimes skips, menses last 4-5 days, pt passes clots, something coming out, pain with intercourse, hx fibroids ? ? ?CC: irregular menses ?Subjective:  ? ? Patient ID: Dana Hughes, female    DOB: 07-18-1980, 41 y.o.   MRN: [id number] ? ?HPI ?41 yo G4P3 seen for discussion of irregular menses, heavy bleeding and possible pelvic relaxation.  She notes irregular menses sometimes twice a month.  She will occasionally skip a month with her menses as well.  Her menses last 4-5 days.  During her menses she will sometimes pass small clots.  Review of her chart showed  potential diagnosis of endometriosis, but this was based on symptoms, not a surgical diagnosis.  Pt also notes her spouse has said "something is coming out of the vagina."  Pt notes some discomfort with intercourse both with initial penetration and deeper penetration. ? ?When questioned, pt noted nocturia x 7, loss of urine with cough/laugh, and some other urge symptoms as well.  Pt is a type 2 diabetic with some improvement of her A1c over the last few months. ? ? ?Review of Systems  ?Gastrointestinal:  Negative for abdominal pain, constipation and diarrhea.  ?Genitourinary:  Positive for dyspareunia and frequency. Negative for dysuria.  ? ?   ?Objective:  ? Physical Exam ?Constitutional:   ?   Appearance: Normal appearance. She is obese.  ?HENT:  ?   Head: Normocephalic and atraumatic.  ?Abdominal:  ?   General: There is no distension.  ?   Palpations: Abdomen is soft. There is no mass.  ?   Comments: Some mild tenderness in the LUQ, minimal lower abdominal tenderness.  ?Genitourinary: ?   Comments: CVX WNL, no CMT, no adnexal masses bilaterally. ?Physiologic discharge ?With valsalva cystocele noted, no loss of urine ?Cervix and uterus well supported. ?No rectocele seen ? ?Neurological:  ?   Mental Status: She is alert.  ? ?Vitals:  ? 07/03/21 1439  ?BP: 133/88  ?Pulse: (!) 102  ? ? ? ? ?   ?Assessment & Plan:   ? ?1. Endometriosis ?Evaluation of pelvis ?- US PELVIC COMPLETE WITH TRANSVAGINAL; Future ? ?2. Fibroids ?Recheck of fibroids to see if there was any change in size ?- US PELVIC COMPLETE WITH TRANSVAGINAL; Future ? ?3. History of bilateral tubal ligation ? ?- US PELVIC COMPLETE WITH TRANSVAGINAL; Future ? ?4. Abnormal uterine bleeding (AUB) ? ?- US PELVIC COMPLETE WITH TRANSVAGINAL; Future ? ?5. Uterine leiomyoma, unspecified location ? ? ?6. Mixed stress and urge urinary incontinence ?Will refer to urogynecology due to cystocele and what appears to be mixed incontinence from her history ? ?7. Cystocele without uterine prolapse ? ?- Ambulatory referral to Urogynecology ? ?8. Visit for screening mammogram ? ?- MM Digital Screening; Future ? ?9. Vaginal discharge ?Per pt request will check vaginal swab ?- Cervicovaginal ancillary only( [place]) ? ?10. Irregular menses ?Due to cardiac history would not consider combined contraceptives, but she may benefit from mirena IUD or Sonata procedure depending on location of fibroids ? ? ?F/u in 1 month, moderate decision making ? ? ?Griffin Basil, MD ?Faculty Attending, [facility]  ?

## 2021-07-03 NOTE — Patient Instructions (Signed)
[facility] [phone]. ?

## 2021-07-03 NOTE — Progress Notes (Signed)
Pt states having irregular cycles x 1 year. Heavy bleeding with large clots with cycle.  ?

## 2021-07-04 ENCOUNTER — Other Ambulatory Visit: Payer: Self-pay | Admitting: Student

## 2021-07-04 ENCOUNTER — Other Ambulatory Visit: Payer: Self-pay | Admitting: Obstetrics and Gynecology

## 2021-07-04 DIAGNOSIS — Z1231 Encounter for screening mammogram for malignant neoplasm of breast: Secondary | ICD-10-CM

## 2021-07-04 LAB — CERVICOVAGINAL ANCILLARY ONLY
Bacterial Vaginitis (gardnerella): POSITIVE — AB
Candida Glabrata: NEGATIVE
Candida Vaginitis: POSITIVE — AB
Chlamydia: NEGATIVE
Comment: NEGATIVE
Comment: NEGATIVE
Comment: NEGATIVE
Comment: NEGATIVE
Comment: NEGATIVE
Comment: NORMAL
Neisseria Gonorrhea: NEGATIVE
Trichomonas: NEGATIVE

## 2021-07-05 ENCOUNTER — Other Ambulatory Visit: Payer: Self-pay

## 2021-07-05 ENCOUNTER — Telehealth: Payer: Self-pay

## 2021-07-05 DIAGNOSIS — B379 Candidiasis, unspecified: Secondary | ICD-10-CM

## 2021-07-05 DIAGNOSIS — B9689 Other specified bacterial agents as the cause of diseases classified elsewhere: Secondary | ICD-10-CM

## 2021-07-05 MED ORDER — METRONIDAZOLE 500 MG PO TABS
500.0000 mg | ORAL_TABLET | Freq: Two times a day (BID) | ORAL | 0 refills | Status: DC
  Filled 2021-07-05 – 2021-07-14 (×2): qty 14, 7d supply, fill #0

## 2021-07-05 MED ORDER — FLUCONAZOLE 150 MG PO TABS
150.0000 mg | ORAL_TABLET | Freq: Once | ORAL | 0 refills | Status: AC
  Filled 2021-07-05 – 2021-07-14 (×2): qty 1, 1d supply, fill #0

## 2021-07-05 NOTE — Telephone Encounter (Addendum)
-----   Message from Griffin Basil, MD sent at 07/05/2021  8:25 AM EDT ----- ?BV and yeast noted on swab, will offer treatment ? ?  ?Called patient; VM left stating I am calling with results and patient may check MyChart for more information.  ?

## 2021-07-12 ENCOUNTER — Other Ambulatory Visit: Payer: Self-pay

## 2021-07-14 ENCOUNTER — Ambulatory Visit (HOSPITAL_COMMUNITY)
Admission: RE | Admit: 2021-07-14 | Discharge: 2021-07-14 | Disposition: A | Discharge status: Home / Self Care | Payer: Self-pay | Source: Ambulatory Visit | Attending: Obstetrics and Gynecology | Admitting: Obstetrics and Gynecology

## 2021-07-14 ENCOUNTER — Other Ambulatory Visit: Payer: Self-pay

## 2021-07-14 ENCOUNTER — Ambulatory Visit: Payer: Medicaid Other

## 2021-07-14 DIAGNOSIS — D219 Benign neoplasm of connective and other soft tissue, unspecified: Secondary | ICD-10-CM | POA: Insufficient documentation

## 2021-07-14 DIAGNOSIS — N809 Endometriosis, unspecified: Secondary | ICD-10-CM | POA: Insufficient documentation

## 2021-07-14 DIAGNOSIS — Z1231 Encounter for screening mammogram for malignant neoplasm of breast: Secondary | ICD-10-CM

## 2021-07-14 DIAGNOSIS — Z9851 Tubal ligation status: Secondary | ICD-10-CM | POA: Insufficient documentation

## 2021-07-14 DIAGNOSIS — N939 Abnormal uterine and vaginal bleeding, unspecified: Secondary | ICD-10-CM | POA: Insufficient documentation

## 2021-07-17 ENCOUNTER — Encounter: Payer: Self-pay | Admitting: Student

## 2021-07-17 ENCOUNTER — Ambulatory Visit (INDEPENDENT_AMBULATORY_CARE_PROVIDER_SITE_OTHER): Payer: Self-pay | Admitting: Student

## 2021-07-17 ENCOUNTER — Ambulatory Visit (HOSPITAL_COMMUNITY)
Admission: RE | Admit: 2021-07-17 | Discharge: 2021-07-17 | Disposition: A | Discharge status: Home / Self Care | Payer: Medicaid Other | Source: Ambulatory Visit | Attending: Internal Medicine | Admitting: Internal Medicine

## 2021-07-17 ENCOUNTER — Other Ambulatory Visit: Payer: Self-pay

## 2021-07-17 VITALS — BP 125/90 | HR 93 | Temp 98.4°F | Ht 63.5 in | Wt 211.9 lb

## 2021-07-17 DIAGNOSIS — R0789 Other chest pain: Secondary | ICD-10-CM

## 2021-07-17 DIAGNOSIS — R079 Chest pain, unspecified: Secondary | ICD-10-CM

## 2021-07-17 DIAGNOSIS — J45909 Unspecified asthma, uncomplicated: Secondary | ICD-10-CM

## 2021-07-17 MED ORDER — ALBUTEROL SULFATE HFA 108 (90 BASE) MCG/ACT IN AERS: 2.0000 | INHALATION_SPRAY | Freq: Four times a day (QID) | RESPIRATORY_TRACT | Status: DC | PRN

## 2021-07-17 MED ORDER — DICLOFENAC SODIUM 1 % EX GEL
4.0000 g | Freq: Four times a day (QID) | CUTANEOUS | 0 refills | Status: DC
Start: 2021-07-17 — End: 2023-05-01
  Filled 2021-07-17: qty 100, 25d supply, fill #0
  Filled 2022-06-12: qty 100, 8d supply, fill #0

## 2021-07-17 MED ORDER — PANTOPRAZOLE SODIUM 40 MG PO TBEC
40.0000 mg | DELAYED_RELEASE_TABLET | Freq: Every day | ORAL | 0 refills | Status: DC
  Filled 2021-07-17: qty 30, 30d supply, fill #0

## 2021-07-17 MED ORDER — ALBUTEROL SULFATE (2.5 MG/3ML) 0.083% IN NEBU
2.5000 mg | INHALATION_SOLUTION | Freq: Four times a day (QID) | RESPIRATORY_TRACT | 0 refills | Status: AC | PRN
  Filled 2021-07-17 – 2022-03-31 (×2): qty 75, 7d supply, fill #0

## 2021-07-17 NOTE — Patient Instructions (Signed)
[name], it was a pleasure seeing you today! ? ?Today we discussed: ?- Chest pain: I do not believe your chest pain is due to your heart. I would like for you to try rubbing voltaren gel on the area and take pantoprazole. If your symptoms do not improve, you can return to clinic. ? ?- If you have any episodes of passing out, chest pain that feels squeezing or pressure-like, or cannot catch your breath, please call 911. ? ?I have ordered the following medication/changed the following medications:  ? ?Start the following medications: ?Meds ordered this encounter  ?Medications  ? albuterol (PROVENTIL) (2.5 MG/3ML) 0.083% nebulizer solution  ?  Sig: Take 3 mLs (2.5 mg total) by nebulization every 6 (six) hours as needed for wheezing or shortness of breath.  ?  Dispense:  75 mL  ?  Refill:  0  ? albuterol (VENTOLIN HFA) 108 (90 Base) MCG/ACT inhaler  ?  Sig: Inhale 2 puffs into the lungs every 6 (six) hours as needed for wheezing or shortness of breath. Reported on 09/29/2015  ? diclofenac Sodium (VOLTAREN) 1 % GEL  ?  Sig: Apply 4 g topically 4 (four) times daily.  ?  Dispense:  100 g  ?  Refill:  0  ? pantoprazole (PROTONIX) 40 MG tablet  ?  Sig: Take 1 tablet (40 mg total) by mouth daily.  ?  Dispense:  30 tablet  ?  Refill:  0  ?  ? ?Follow-up: 3 months  ? ?Please make sure to arrive 15 minutes prior to your next appointment. If you arrive late, you may be asked to reschedule.  ? ?We look forward to seeing you next time. Please call our clinic at [phone] if you have any questions or concerns. The best time to call is Monday-Friday from 9am-4pm, but there is someone available 24/7. If after hours or the weekend, call the main hospital number and ask for the Internal Medicine Resident On-Call. If you need medication refills, please notify your pharmacy one week in advance and they will send [place] a request. ? ?Thank you for letting us take part in your care. Wishing you the best! ? ?Thank you, ?Sanjuan Dame, MD ? ?

## 2021-07-18 DIAGNOSIS — R079 Chest pain, unspecified: Secondary | ICD-10-CM

## 2021-07-18 DIAGNOSIS — R0789 Other chest pain: Secondary | ICD-10-CM | POA: Insufficient documentation

## 2021-07-18 HISTORY — DX: Chest pain, unspecified: R07.9

## 2021-07-18 NOTE — Progress Notes (Signed)
? ?  CC: chest pain ? ?HPI: ? ?Dana Hughes is a 41 y.o. person with medical history as below presenting to [facility] for chest pain. ? ?Please see problem-based list for further details, assessments, and plans. ? ?Past Medical History:  ?Diagnosis Date  ? Anxiety   ? Arthritis   ? Asthma   ? Bronchitis   ? BV (bacterial vaginosis)   ? Candidiasis   ? Depression   ? Diabetes mellitus without complication ([place])   ? Endometriosis   ? Headache(784.0)   ? migraines  ? Hypertension   ? no longer takes meds  ? Migraines   ? Muscle spasm   ? Ovarian cyst   ? Pelvic pain in female 09/29/2015  ? Scoliosis   ? Uterine fibroid   ? ?Review of Systems:  As per HPI ? ?Physical Exam: ? ?Vitals:  ? 07/17/21 1336 07/17/21 1349  ?BP: (!) 134/92 125/90  ?Pulse: 93 93  ?Temp: 98.4 ?F (36.9 ?C)   ?TempSrc: Oral   ?SpO2: 100%   ?Weight: 211 lb 14.4 oz (96.1 kg)   ?Height: 5' 3.5" (1.613 m)   ? ?General: Resting comfortably in no acute distress. ?CV: Regular rate, rhythm. No murmurs appreciated.  ?Pulm: Normal respiratory effort on room air. Clear to ausculation bilaterally. ?MSK: Normal bulk, tone. Mid-sternal chest pain present on palpation. ?Skin: Warm, dry. No rashes or lesions appreciated.  ?Neuro: Awake, alert, conversing appropriately.  ?Psych: Normal mood, affect, speech. ? ?Assessment & Plan:  ? ?Atypical chest pain ?Dana Hughes is presenting today with active chest pain. She reports chronic intermittent chest pain that usually self-resolves after a few seconds. However, the chest pain over the last week has persisted. The pain is similar to her usual chest pain, just lasts longer. Dana Hughes reports this started after she got out of bed one morning. She describes sharp, stabbing, non-radiating mid-sternal chest pain that worsens with exertion. She states the pain continues at rest and will resolve after 10/15 minutes. Denies any strenuous exercise or heavy lifting. The chest pain is usually associated with dyspnea and sometimes  lightheadedness, but has not had any diaphoresis, nausea, vomiting,  She also denies diaphoresis, nausea, vomiting, abdominal pain, shoulder/jaw pain, syncope.  ? ?ECG performed in the office with non-specific ST changes from previous in December. Precordial leads appear improved, but non-specific changes in other leads without contiguous pattern. ? ?Dana Hughes is hemodynamically stable, sating well on room air. She appears comfortable and overall exam is benign. Chest pain is reproducible on palpation. Current cardiovascular risk factors include previous tobacco use, type II diabetes, obesity. Pain is very atypical and is unlikely to be cardiac related, especially given her age and lack of family history of cardiac disease - most likely this is MSK or GI related. Will supply her with PPI therapy for possible GERD and voltaren gel for possible costochondritis. ? ?- Start pantoprazole '40mg'$  daily ?- Start voltaren gel as needed on chest  ?- Return precautions given ?- Risk factor modification ? ?Patient discussed with Dr. Daryll Drown ? ?Dana Dame, MD ?Internal Medicine PGY-2 ?Pager: [phone] ? ?

## 2021-07-18 NOTE — Assessment & Plan Note (Addendum)
Dana Hughes is presenting today with active chest pain. She reports chronic intermittent chest pain that usually self-resolves after a few seconds. However, the chest pain over the last week has persisted. The pain is similar to her usual chest pain, just lasts longer. Dana Hughes reports this started after she got out of bed one morning. She describes sharp, stabbing, non-radiating mid-sternal chest pain that worsens with exertion. She states the pain continues at rest and will resolve after 10/15 minutes. Denies any strenuous exercise or heavy lifting. The chest pain is usually associated with dyspnea and sometimes lightheadedness, but has not had any diaphoresis, nausea, vomiting,  She also denies diaphoresis, nausea, vomiting, abdominal pain, shoulder/jaw pain, syncope.  ? ?ECG performed in the office with non-specific ST changes from previous in December. Precordial leads appear improved, but non-specific changes in other leads without contiguous pattern. ? ?Dana Hughes is hemodynamically stable, sating well on room air. She appears comfortable and overall exam is benign. Chest pain is reproducible on palpation. Current cardiovascular risk factors include previous tobacco use, type II diabetes, obesity. Pain is very atypical and is unlikely to be cardiac related, especially given her age and lack of family history of cardiac disease - most likely this is MSK or GI related. Will supply her with PPI therapy for possible GERD and voltaren gel for possible costochondritis. ? ?- Start pantoprazole '40mg'$  daily ?- Start voltaren gel as needed on chest  ?- Return precautions given ?- Risk factor modification ?

## 2021-07-24 ENCOUNTER — Other Ambulatory Visit: Payer: Self-pay

## 2021-07-25 ENCOUNTER — Telehealth (HOSPITAL_COMMUNITY): Payer: No Payment, Other | Admitting: Psychiatry

## 2021-07-25 ENCOUNTER — Encounter (HOSPITAL_COMMUNITY): Payer: Self-pay

## 2021-07-27 ENCOUNTER — Telehealth (HOSPITAL_COMMUNITY): Payer: Self-pay | Admitting: Psychiatry

## 2021-08-01 ENCOUNTER — Ambulatory Visit: Payer: Medicaid Other | Admitting: Medical

## 2021-08-07 NOTE — Progress Notes (Signed)
Internal Medicine Clinic Attending ? ?Case discussed with Dr. Braswell  at the time of the visit.  We reviewed the resident?s history and exam and pertinent patient test results.  I agree with the assessment, diagnosis, and plan of care documented in the resident?s note.  ?

## 2021-08-08 ENCOUNTER — Ambulatory Visit (HOSPITAL_COMMUNITY): Payer: No Payment, Other | Admitting: Psychiatry

## 2021-08-09 ENCOUNTER — Ambulatory Visit
Admission: RE | Admit: 2021-08-09 | Discharge: 2021-08-09 | Disposition: A | Discharge status: Home / Self Care | Payer: Medicaid Other | Source: Ambulatory Visit | Attending: Obstetrics and Gynecology | Admitting: Obstetrics and Gynecology

## 2021-08-09 DIAGNOSIS — Z1231 Encounter for screening mammogram for malignant neoplasm of breast: Secondary | ICD-10-CM

## 2021-08-16 ENCOUNTER — Encounter: Payer: Medicaid Other | Admitting: Internal Medicine

## 2021-08-23 ENCOUNTER — Encounter (HOSPITAL_COMMUNITY): Payer: Self-pay

## 2021-08-23 ENCOUNTER — Telehealth (HOSPITAL_COMMUNITY): Payer: No Payment, Other | Admitting: Psychiatry

## 2021-09-25 ENCOUNTER — Telehealth: Payer: Self-pay | Admitting: *Deleted

## 2021-09-25 NOTE — Telephone Encounter (Signed)
Appointment Request From: [name]   With Provider: Jose Persia, MD [facility]]   Preferred Date Range: 10/04/2021 - 10/05/2021   Preferred Times: Wednesday Morning, Thursday Morning   Reason for visit: Office Visit   Comments: Having chest pains and dizzy spells light headed can't breathe felt like I was having a heart attack  it last for 45mns then the pain stopped these episodes come often every now and then it's not my blood sugar  or blood pressure  it's something else plz help cause it's scaring me when I have these episodes    I called pt who stated she's feeling better today; cp and dizziness have decreased. CP "comes and goes". Stated she has been feeling tired and shob over the w/e  Stated she had cp before; happens mainly at night and non-radiating. Appt given tomorrow 6/6 with Dr CEulas Post@ 0915Am. Pt instructed to go to ER if her symptoms worsen - stated she understands.

## 2021-09-26 ENCOUNTER — Encounter: Payer: Medicaid Other | Admitting: Student

## 2021-09-28 ENCOUNTER — Telehealth (INDEPENDENT_AMBULATORY_CARE_PROVIDER_SITE_OTHER): Payer: No Payment, Other | Admitting: Psychiatry

## 2021-09-28 ENCOUNTER — Other Ambulatory Visit: Payer: Self-pay

## 2021-09-28 ENCOUNTER — Encounter (HOSPITAL_COMMUNITY): Payer: Self-pay | Admitting: Psychiatry

## 2021-09-28 DIAGNOSIS — F3181 Bipolar II disorder: Secondary | ICD-10-CM | POA: Diagnosis not present

## 2021-09-28 DIAGNOSIS — F411 Generalized anxiety disorder: Secondary | ICD-10-CM

## 2021-09-28 MED ORDER — HYDROXYZINE HCL 25 MG PO TABS
25.0000 mg | ORAL_TABLET | Freq: Three times a day (TID) | ORAL | 3 refills | Status: DC | PRN
  Filled 2021-09-28: qty 90, 30d supply, fill #0

## 2021-09-28 MED ORDER — MIRTAZAPINE 45 MG PO TABS
45.0000 mg | ORAL_TABLET | Freq: Every day | ORAL | 3 refills | Status: DC
  Filled 2021-09-28: qty 30, 30d supply, fill #0

## 2021-09-28 MED ORDER — ARIPIPRAZOLE 15 MG PO TABS
15.0000 mg | ORAL_TABLET | Freq: Every day | ORAL | 3 refills | Status: DC
  Filled 2021-09-28: qty 30, 30d supply, fill #0

## 2021-09-28 MED ORDER — GABAPENTIN 300 MG PO CAPS
300.0000 mg | ORAL_CAPSULE | Freq: Three times a day (TID) | ORAL | 3 refills | Status: DC
  Filled 2021-09-28: qty 90, 30d supply, fill #0

## 2021-09-28 MED ORDER — LAMOTRIGINE 150 MG PO TABS
150.0000 mg | ORAL_TABLET | Freq: Every day | ORAL | 3 refills | Status: DC
  Filled 2021-09-28: qty 30, 30d supply, fill #0

## 2021-09-28 NOTE — Progress Notes (Signed)
BH MD/PA/NP OP Progress Note Virtual Visit via Telephone Note  I connected with Dana Hughes on 09/28/21 at  3:30 PM EDT by telephone and verified that I am speaking with the correct person using two identifiers.  Location: Patient: home Provider: Clinic   I discussed the limitations, risks, security and privacy concerns of performing an evaluation and management service by telephone and the availability of in person appointments. I also discussed with the patient that there may be a patient responsible charge related to this service. The patient expressed understanding and agreed to proceed.   I provided 30 minutes of non-face-to-face time during this encounter.    09/28/2021 3:57 PM Dana Hughes  MRN:  [id number]  Chief Complaint:  "I have been having mood swings"   HPI: 41 year old female seen today for follow up psychiatric evaluation. She has a psychiatric history of depression, anxiety, and adjustment disorder.  She is currently managed on Mirtazapine 45 mg nightly, Abilify 15 mg daily, hydroxyzine 25 mg three times daily as needed, Buspar 10 mg three times daily, Lamictal 100 mg daily.  She notes that she discontinued BuSpar because it was causing her to have chest pain.  She inform her that her other medications are somewhat effective in managing her psychiatric conditions.     Today she was unable to logon virtually so assessment was done over the phone.  During exam she was pleasant, cooperative, and engaged in conversation.  She informed Probation officer that she has been running out of her medications and notes that recently she has been having symptoms of hypomania.  She endorses irritability, fluctuations in mood, racing thoughts, and distractibility.  She informed Probation officer that she has been drawing and writing to cope.  Provider conducted a GAD-7 and patient scored an 18.  Provider also conducted PHQ-9 and patient scored 18.  She notes that her appetite fluctuates but denies recent  weight gain/weight loss.  She also endorses hypersomnia.  Today she denies SI/HI.  Patient informed Probation officer that she has reduced her marijuana consumption.    Patient continues to have back pain that she is treating with over-the-counter medications.  She reports that these medications are somewhat effective in managing her pain.  Today she is agreeable to increase Lamictal 100 mg to 150 mg to help manage mood.  She is also agreeable to starting gabapentin 300 mg 3 times daily to help manage mood, anxiety, and pain.  Potential side effects of medication and risks vs benefits of treatment vs non-treatment were explained and discussed. All questions were answered. At this time she would like to discontinue BuSpar. She will continue her other medications as prescribed and follow-up with outpatient counseling for therapy.  No other concerns at this time.      Visit Diagnosis:    ICD-10-CM   1. Bipolar 2 disorder, major depressive episode (HCC)  F31.81 ARIPiprazole (ABILIFY) 15 MG tablet    mirtazapine (REMERON) 45 MG tablet    lamoTRIgine (LAMICTAL) 150 MG tablet    gabapentin (NEURONTIN) 300 MG capsule    2. Generalized anxiety disorder  F41.1 hydrOXYzine (ATARAX) 25 MG tablet    mirtazapine (REMERON) 45 MG tablet    gabapentin (NEURONTIN) 300 MG capsule      Past Psychiatric History: depression, anxiety, and adjustment disorder.  Past Medical History:  Past Medical History:  Diagnosis Date   Anxiety    Arthritis    Asthma    Bronchitis    BV (bacterial vaginosis)  Candidiasis    Depression    Diabetes mellitus without complication ([place])    Endometriosis    Headache(784.0)    migraines   Hypertension    no longer takes meds   Migraines    Muscle spasm    Ovarian cyst    Pelvic pain in female 09/29/2015   Scoliosis    Uterine fibroid     Past Surgical History:  Procedure Laterality Date   BIOPSY  08/12/2020   Procedure: BIOPSY;  Surgeon: Mauri Pole, MD;  Location:  [place];  Service: Endoscopy;;   CHOLECYSTECTOMY     COLONOSCOPY WITH PROPOFOL N/A 08/12/2020   Procedure: COLONOSCOPY WITH PROPOFOL;  Surgeon: Mauri Pole, MD;  Location: [place] [facility];  Service: Endoscopy;  Laterality: N/A;   ESOPHAGOGASTRODUODENOSCOPY (EGD) WITH PROPOFOL N/A 08/12/2020   Procedure: ESOPHAGOGASTRODUODENOSCOPY (EGD) WITH PROPOFOL;  Surgeon: Mauri Pole, MD;  Location: [place] [facility];  Service: Endoscopy;  Laterality: N/A;   POLYPECTOMY  08/12/2020   Procedure: POLYPECTOMY;  Surgeon: Mauri Pole, MD;  Location: [facility];  Service: Endoscopy;;   TUBAL LIGATION      Family Psychiatric History: Alcohol use disorder paternal and maternal family members, siblings anxiety,   Family History:  Family History  Problem Relation Age of Onset   Migraines Mother    Cancer Father    Colon cancer Father    Lupus Paternal Grandmother    Cancer Paternal Grandmother    Breast cancer Paternal Grandmother    Migraines Maternal Aunt    Hypertension Maternal Aunt    Breast cancer Maternal Aunt    Colon cancer Paternal Aunt    Breast cancer Paternal Aunt    Ovarian cancer Cousin     Social History:  Social History   Socioeconomic History   Marital status: Single    Spouse name: Not on file   Number of children: Not on file   Years of education: Not on file   Highest education level: Not on file  Occupational History   Not on file  Tobacco Use   Smoking status: Former    Types: Cigars   Smokeless tobacco: Never   Tobacco comments:    1-2 cigars/week  Vaping Use   Vaping Use: Never used  Substance and Sexual Activity   Alcohol use: Not Currently    Comment: occasional   Drug use: Yes    Frequency: 7.0 times per week    Types: Marijuana    Comment: 1 to 2 blunts a week, last use last night   Sexual activity: Yes    Birth control/protection: Condom, Surgical  Other Topics Concern   Not on file  Social History Narrative   Not on file    Social Determinants of Health   Financial Resource Strain: Not on file  Food Insecurity: Food Insecurity Present (07/03/2021)   Hunger Vital Sign    Worried About Running Out of Food in the Last Year: Often true    Ran Out of Food in the Last Year: Sometimes true  Transportation Needs: No Transportation Needs (07/03/2021)   PRAPARE - Hydrologist (Medical): No    Lack of Transportation (Non-Medical): No  Physical Activity: Not on file  Stress: Not on file  Social Connections: Not on file    Allergies:  Allergies  Allergen Reactions   Mushroom Extract Complex Anaphylaxis   Latex Itching and Swelling   Penicillins Itching    Has patient had a PCN reaction causing  immediate rash, facial/tongue/throat swelling, SOB or lightheadedness with hypotension: No Has patient had a PCN reaction causing severe rash involving mucus membranes or skin necrosis: No Has patient had a PCN reaction that required hospitalization No Has patient had a PCN reaction occurring within the last 10 years: No If all of the above answers are "NO", then may proceed with Cephalosporin use.     Metabolic Disorder Labs: Lab Results  Component Value Date   HGBA1C 6.9 (H) 06/22/2021   MPG 151.33 06/22/2021   MPG 271.87 11/12/2019   No results found for: "PROLACTIN" No results found for: "CHOL", "TRIG", "HDL", "CHOLHDL", "VLDL", "LDLCALC" Lab Results  Component Value Date   TSH 1.279 05/29/2017    Therapeutic Level Labs: No results found for: "LITHIUM" No results found for: "VALPROATE" No results found for: "CBMZ"  Current Medications: Current Outpatient Medications  Medication Sig Dispense Refill   gabapentin (NEURONTIN) 300 MG capsule Take 1 capsule (300 mg total) by mouth 3 (three) times daily. 90 capsule 3   acetaminophen (TYLENOL) 500 MG tablet Take 2 tablets (1,000 mg total) by mouth every 6 (six) hours as needed for mild pain (or Fever >/= 101). 30 tablet 0    albuterol (PROVENTIL) (2.5 MG/3ML) 0.083% nebulizer solution Take 3 mLs (2.5 mg total) by nebulization every 6 (six) hours as needed for wheezing or shortness of breath. 75 mL 0   albuterol (VENTOLIN HFA) 108 (90 Base) MCG/ACT inhaler Inhale 2 puffs into the lungs every 6 (six) hours as needed for wheezing or shortness of breath. Reported on 09/29/2015     ARIPiprazole (ABILIFY) 15 MG tablet Take 1 tablet (15 mg total) by mouth daily. 30 tablet 3   artificial tears (LACRILUBE) OINT ophthalmic ointment Place into both eyes at bedtime as needed for dry eyes. 3.5 g 0   diclofenac Sodium (VOLTAREN) 1 % GEL Apply 4 g topically 4 (four) times daily. 100 g 0   dicyclomine (BENTYL) 10 MG capsule Take 1 capsule (10 mg total) by mouth 4 (four) times daily as needed for spasms (abdominal pain). 30 capsule 1   ferrous sulfate 325 (65 FE) MG tablet Take 1 tablet (325 mg total) by mouth every other day. 30 tablet 3   fluticasone (FLONASE) 50 MCG/ACT nasal spray PLACE 1 SPRAY INTO BOTH NOSTRILS IN THE MORNING AND AT BEDTIME. 16 g 1   glucose blood test strip Check your sugar in the morning before you eat breakfast, and one hour after a meal. 100 each 2   glucose monitoring kit (FREESTYLE) monitoring kit 1 each by Does not apply route daily. Check glucose once in the morning before breakfast and 1 hour after a meal 1 each 0   guaifenesin (ROBITUSSIN) 100 MG/5ML syrup Take 200 mg by mouth 3 (three) times daily as needed for cough.     hydrOXYzine (ATARAX) 25 MG tablet Take 1 tablet (25 mg total) by mouth 3 (three) times daily as needed. 90 tablet 3   insulin aspart (NOVOLOG) 100 UNIT/ML FlexPen Inject 10 Units into the skin 2 (two) times daily with a meal. 6 mL 0   insulin glargine (LANTUS) 100 UNIT/ML Solostar Pen Inject 20 Units into the skin daily at 10pm. 6 mL 0   Insulin Pen Needle 32G X 4 MM MISC Use as directed 4 times daily. 200 each 0   lamoTRIgine (LAMICTAL) 150 MG tablet Take 1 tablet (150 mg total) by mouth  daily. 30 tablet 3   liraglutide (VICTOZA) 18 MG/3ML SOPN  Inject 0.6 mg into the skin daily for 7 days, THEN 1.2 mg daily for 23 days. 6 mL 3   meclizine (ANTIVERT) 25 MG tablet Take 1 tablet (25 mg total) by mouth 3 (three) times daily as needed for dizziness. 30 tablet 0   metFORMIN (GLUCOPHAGE) 1000 MG tablet Take 1 tablet (1,000 mg total) by mouth 2 (two) times daily with a meal. 90 tablet 3   metroNIDAZOLE (FLAGYL) 500 MG tablet Take 1 tablet (500 mg total) by mouth 2 (two) times daily. 14 tablet 0   mirtazapine (REMERON) 45 MG tablet Take 1 tablet (45 mg total) by mouth at bedtime. 30 tablet 3   oxyCODONE (OXY IR/ROXICODONE) 5 MG immediate release tablet Take 1 tablet (5 mg total) by mouth 2 (two) times daily as needed for severe pain. (Patient not taking: Reported on 07/03/2021) 10 tablet 0   pantoprazole (PROTONIX) 40 MG tablet Take 1 tablet (40 mg total) by mouth daily. 30 tablet 0   senna-docusate (SENOKOT S) 8.6-50 MG tablet Take 1 tablet by mouth daily. 90 tablet 2   SUMAtriptan (IMITREX) 50 MG tablet Take 1 tablet (50 mg total) by mouth every 2 (two) hours as needed for migraine. May repeat in 2 hours if headache persists or recurs. Please take no more than 3 tablets (150 mg) in 24 hours. 15 tablet 0   No current facility-administered medications for this visit.     Musculoskeletal: Strength & Muscle Tone:  Unable to assess due to telephone visit Gait & Station:  Unable to assess due to telephone visit Patient leans: N/A  Psychiatric Specialty Exam: Review of Systems  There were no vitals taken for this visit.There is no height or weight on file to calculate BMI.  General Appearance:  Unable to assess due to telephone visit  Eye Contact:   Unable to assess due to telephone visit  Speech:  Clear and Coherent and Normal Rate  Volume:  Normal  Mood:  Anxious and Depressed  Affect:  Congruent  Thought Process:  Coherent, Goal Directed and Linear  Orientation:  Full (Time, Place,  and Person)  Thought Content: WDL and Logical   Suicidal Thoughts:  No  Homicidal Thoughts:  No  Memory:  Immediate;   Good Recent;   Good Remote;   Good  Judgement:  Good  Insight:  Good  Psychomotor Activity:  Normal  Concentration:  Concentration: Good and Attention Span: Good  Recall:  Good  Fund of Knowledge: Good  Language: Good  Akathisia:  No  Handed:  Right  AIMS (if indicated): Not done  Assets:  Communication Skills Desire for Improvement Financial Resources/Insurance Housing Intimacy Social Support  ADL's:  Intact  Cognition: WNL  Sleep:  Fair   Screenings: GAD-7    Flowsheet Row Video Visit from 09/28/2021 in [facility] Office Visit from 07/03/2021 in [facility] [place] [facility] Video Visit from 05/16/2021 in [facility] Video Visit from 04/13/2021 in [facility] from 01/19/2021 in [facility]  Total GAD-7 Score _0 PHQ2-9    Flowsheet Row Video Visit from 09/28/2021 in [facility] Office Visit from 07/17/2021 in [place] Office Visit from 07/03/2021 in [place] [facility] [place] [facility] Office Visit from 06/22/2021 in [place] Video Visit from 05/16/2021  in [facility]  PHQ-2 Total Score _0 PHQ-9 Total Score _1 Flowsheet Row Video Visit from 05/16/2021 in [facility] Video Visit from 04/13/2021 in [facility] ED from 04/07/2021 in [facility] [place] Error: Question 6 not populated Error: Q7 should not be populated when Q6 is No No Risk        Assessment and Plan: Patient endorses hypomania, anxiety,and depression. Today she is  agreeable to increase Lamictal 100 mg to 150 mg to help manage mood.  She is also agreeable to starting gabapentin 300 mg 3 times daily to help manage mood, anxiety, and pain.  At this time she would like to discontinue BuSpar.  She will continue her other medications as prescribed   1. Bipolar 2 disorder, major depressive episode (HCC)  Continue- ARIPiprazole (ABILIFY) 15 MG tablet; Take 1 tablet (15 mg total) by mouth daily.  Dispense: 30 tablet; Refill: 3 Continue- mirtazapine (REMERON) 45 MG tablet; Take 1 tablet (45 mg total) by mouth at bedtime.  Dispense: 30 tablet; Refill: 3 Increased- lamoTRIgine (LAMICTAL) 150 MG tablet; Take 1 tablet (150 mg total) by mouth daily.  Dispense: 30 tablet; Refill: 3 Start- gabapentin (NEURONTIN) 300 MG capsule; Take 1 capsule (300 mg total) by mouth 3 (three) times daily.  Dispense: 90 capsule; Refill: 3  2. Generalized anxiety disorder  Continue- hydrOXYzine (ATARAX) 25 MG tablet; Take 1 tablet (25 mg total) by mouth 3 (three) times daily as needed.  Dispense: 90 tablet; Refill: 3 Continue- mirtazapine (REMERON) 45 MG tablet; Take 1 tablet (45 mg total) by mouth at bedtime.  Dispense: 30 tablet; Refill: 3 Start- gabapentin (NEURONTIN) 300 MG capsule; Take 1 capsule (300 mg total) by mouth 3 (three) times daily.  Dispense: 90 capsule; Refill: 3   Follow up in 3 months Follow up with therapy  Salley Slaughter, NP 09/28/2021, 3:57 PM

## 2021-10-04 ENCOUNTER — Ambulatory Visit (INDEPENDENT_AMBULATORY_CARE_PROVIDER_SITE_OTHER): Payer: Self-pay | Admitting: Student

## 2021-10-04 ENCOUNTER — Encounter: Payer: Self-pay | Admitting: Student

## 2021-10-04 ENCOUNTER — Ambulatory Visit (HOSPITAL_COMMUNITY): Payer: Medicaid Other | Attending: Internal Medicine

## 2021-10-04 VITALS — BP 120/84 | HR 85 | Temp 98.2°F | Wt 217.2 lb

## 2021-10-04 DIAGNOSIS — R079 Chest pain, unspecified: Secondary | ICD-10-CM

## 2021-10-04 DIAGNOSIS — Z5989 Other problems related to housing and economic circumstances: Secondary | ICD-10-CM

## 2021-10-04 LAB — D-DIMER, QUANTITATIVE: D-Dimer, Quant: 0.4 ug/mL-FEU (ref 0.00–0.50)

## 2021-10-04 LAB — POCT GLYCOSYLATED HEMOGLOBIN (HGB A1C): Hemoglobin A1C: 7.7 % — AB (ref 4.0–5.6)

## 2021-10-04 LAB — GLUCOSE, CAPILLARY: Glucose-Capillary: 187 mg/dL — ABNORMAL HIGH (ref 70–99)

## 2021-10-04 NOTE — Patient Instructions (Addendum)
We will refer you to Milus Height our SW for assistance with getting insurance.   We will still send a referral to cardiology for you.   We will call you with the lab results.

## 2021-10-05 LAB — LIPID PANEL
Chol/HDL Ratio: 3.3 ratio (ref 0.0–4.4)
Cholesterol, Total: 147 mg/dL (ref 100–199)
HDL: 45 mg/dL (ref >39)
LDL Chol Calc (NIH): 80 mg/dL (ref 0–99)
Triglycerides: 121 mg/dL (ref 0–149)
VLDL Cholesterol Cal: 22 mg/dL (ref 5–40)

## 2021-10-05 LAB — BMP8+ANION GAP
Anion Gap: 15 mmol/L (ref 10.0–18.0)
BUN/Creatinine Ratio: 7 — ABNORMAL LOW (ref 9–23)
BUN: 6 mg/dL (ref 6–24)
CO2: 21 mmol/L (ref 20–29)
Calcium: 9.2 mg/dL (ref 8.7–10.2)
Chloride: 103 mmol/L (ref 96–106)
Creatinine, Ser: 0.91 mg/dL (ref 0.57–1.00)
Glucose: 191 mg/dL — ABNORMAL HIGH (ref 70–99)
Potassium: 4.3 mmol/L (ref 3.5–5.2)
Sodium: 139 mmol/L (ref 134–144)
eGFR: 82 mL/min/{1.73_m2} (ref >59)

## 2021-10-05 NOTE — Progress Notes (Signed)
   CC: chest pain   HPI:  Dana Hughes is a 41 y.o. F with PMH per below who presents for follow up of her chest pain. Please see problem based charting under encounters tab for further details.    Past Medical History:  Diagnosis Date   Anxiety    Arthritis    Asthma    Bronchitis    BV (bacterial vaginosis)    Candidiasis    Depression    Diabetes mellitus without complication ([place])    Endometriosis    Headache(784.0)    migraines   Hypertension    no longer takes meds   Migraines    Muscle spasm    Ovarian cyst    Pelvic pain in female 09/29/2015   Scoliosis    Uterine fibroid    Review of Systems:  Please see problem based charting under encounters tab for further details.    Physical Exam:  Vitals:   10/04/21 1015  BP: 120/84  Pulse: 85  Temp: 98.2 F (36.8 C)  TempSrc: Oral  SpO2: 100%  Weight: 217 lb 3.2 oz (98.5 kg)    Constitutional: Well-developed, well-nourished, and in no distress.  HENT:  Head: Normocephalic and atraumatic.  Eyes: EOM are normal.  Neck: Normal range of motion.  Cardiovascular: Normal rate, regular rhythm, intact distal pulses. No gallop and no friction rub.  No murmur heard. No lower extremity edema  Pulmonary: Non labored breathing on room air, no wheezing or rales  Abdominal: Soft. Normal bowel sounds. Non distended and non tender Musculoskeletal: Normal range of motion.        General: No tenderness or edema.  Neurological: Alert and oriented to person, place, and time. Non focal  Skin: Skin is warm and dry.    Assessment & Plan:   See Encounters Tab for problem based charting.  Patient discussed with Dr.  Saverio Danker

## 2021-10-05 NOTE — Assessment & Plan Note (Addendum)
Patient presents with chest pain after she was recently seen in our clinic approximately 3 months ago for similar problem.  Patient describes 2 separate types of chest pain this clinic visit.  She notes that prior to her pulmonary embolism in 08/2020 she will get sharp pains under her left breast when she would take deep breaths.  She states that this chest pain is persistent and reliably elicited with shallow or deep breaths.  Patient also notes a "twisting" pressure in her chest that has been intermittently present since her last clinic visit 06/2021.  Patient states that approximately 10d prior to today's clinic visit she was cooking when she became lightheaded and dizzy. She went to her bed to lie down and recover but states that she briefly lost consciousness. She also experienced the chest pressure during this episode. After resting and drinking water her symptoms completely. She would have about 2-3 episodes of the twisting chest pressure but only had one incident of the severe aforementioned pain. She states the episodes typically last about 10 seconds and are usually associated with dyspnea. The pain is usually in the center of her chest and may travel to the left lateral aspect of the chest. The more she exerts herself before these episodes the more severe they are. She did feel like her asthma could be causing her symptoms and has tried her nebulizer treatments during the episodes to minimal effect. She has also had some associated nausea but no emesis.    On exam, patient does have some tenderness to palpation of her center chest which she states feels like her daily chest pain. She has no murmurs on exam. No wheezing or rales. Her breathing is non labored. She has no lower extremity edema.   EKG obtained this clinic visit had no evidence of acute ischemia, q wave in lead III which was first noted in 06/2021 EKG, maybe subtle q wave in lead II and avf.   A/P: Unclear exact etiology of patient's  symptoms. It appears that she has two separate types of chest pain. Her chest pain that has been present for approximately one year and his  Elicited with deep breaths along with palpation of the chest wall is likely MSK related. For this encouraged patient to use tylenol, ibuprofen, and lidocaine patches.   For the other chest pain that she describes this could be related to stable angina as it appears to be reliably reproduced with exertion. Patient has ASCVD risk factors of obesity, tobacco use, and diabetes. On labs this clinic visit she thankfully does not have hyperlipidemia. Given her age but multiple risk factors and description of stable angina. She would likely benefit from cardiac evaluation, perhaps a ct coronary.   Also considered pulmonary embolism as possible etiology of patient symptoms. Though her PE in 07/2020 was thought to be provoked in the setting of megace and depo provera for her endometriosis. She is currently off of anticoagulation and the aforementioned progesterone based medications. Her [place] PE score is 1.5 and PERC score is 1. Fortunately a d-dimer is negative this clinic visit.   -Patient has cardiology appointment scheduled for 6/19 -Patient has no HLD at this time, will see if she has significant atherosclerotic burden on imaging and start statin therapy at that time.

## 2021-10-08 NOTE — Progress Notes (Signed)
Internal Medicine Clinic Attending  Case discussed with Dr. Carter  At the time of the visit.  We reviewed the resident's history and exam and pertinent patient test results.  I agree with the assessment, diagnosis, and plan of care documented in the resident's note.  

## 2021-10-09 ENCOUNTER — Ambulatory Visit: Payer: Medicaid Other | Admitting: Internal Medicine

## 2021-10-09 NOTE — Progress Notes (Deleted)
Cardiology Office Note:    Date:  10/09/2021   ID:  Dana Hughes, DOB [birth year], MRN [id number]  PCP:  Sanjuan Dame, MD   [facility] Providers Cardiologist:  None { Click to update primary MD,subspecialty MD or APP then REFRESH:1}    Referring MD: Sanjuan Dame, MD   No chief complaint on file. ***  History of Present Illness:    Dana Hughes is a 41 y.o. female with a hx of anxiety, DM2,  referral for CP. Echo in April 2022 showed mildly reduced EF 45-50, RV is mild-moderately enlarged, signs of RV pressure overload with septal flattening in systole. CT consistent with extensive BL PE.   Past Medical History:  Diagnosis Date   Anxiety    Arthritis    Asthma    Bronchitis    BV (bacterial vaginosis)    Candidiasis    Depression    Diabetes mellitus without complication ([place])    Endometriosis    Headache(784.0)    migraines   Hypertension    no longer takes meds   Migraines    Muscle spasm    Ovarian cyst    Pelvic pain in female 09/29/2015   Scoliosis    Uterine fibroid     Past Surgical History:  Procedure Laterality Date   BIOPSY  08/12/2020   Procedure: BIOPSY;  Surgeon: Mauri Pole, MD;  Location: [place];  Service: Endoscopy;;   CHOLECYSTECTOMY     COLONOSCOPY WITH PROPOFOL N/A 08/12/2020   Procedure: COLONOSCOPY WITH PROPOFOL;  Surgeon: Mauri Pole, MD;  Location: [place] [facility];  Service: Endoscopy;  Laterality: N/A;   ESOPHAGOGASTRODUODENOSCOPY (EGD) WITH PROPOFOL N/A 08/12/2020   Procedure: ESOPHAGOGASTRODUODENOSCOPY (EGD) WITH PROPOFOL;  Surgeon: Mauri Pole, MD;  Location: [place] [facility];  Service: Endoscopy;  Laterality: N/A;   POLYPECTOMY  08/12/2020   Procedure: POLYPECTOMY;  Surgeon: Mauri Pole, MD;  Location: [place] [facility];  Service: Endoscopy;;   TUBAL LIGATION      Current Medications: No outpatient medications have been marked as taking for the 10/09/21 encounter (Appointment) with Janina Mayo, MD.     Allergies:   Mushroom extract complex, Latex, and Penicillins   Social History   Socioeconomic History   Marital status: Single    Spouse name: Not on file   Number of children: Not on file   Years of education: Not on file   Highest education level: Not on file  Occupational History   Not on file  Tobacco Use   Smoking status: Former    Types: Cigars   Smokeless tobacco: Never   Tobacco comments:    1-2 cigars/week  Vaping Use   Vaping Use: Never used  Substance and Sexual Activity   Alcohol use: Not Currently    Comment: occasional   Drug use: Yes    Frequency: 7.0 times per week    Types: Marijuana    Comment: 1 to 2 blunts a week, last use last night   Sexual activity: Yes    Birth control/protection: Condom, Surgical  Other Topics Concern   Not on file  Social History Narrative   Not on file   Social Determinants of Health   Financial Resource Strain: Not on file  Food Insecurity: Food Insecurity Present (07/03/2021)   Hunger Vital Sign    Worried About Running Out of Food in the Last Year: Often true    Ran Out of Food in the Last Year: Sometimes true  Transportation Needs: No  Transportation Needs (07/03/2021)   PRAPARE - Hydrologist (Medical): No    Lack of Transportation (Non-Medical): No  Physical Activity: Not on file  Stress: Not on file  Social Connections: Not on file     Family History: The patient's ***family history includes Breast cancer in her maternal aunt, paternal aunt, and paternal grandmother; Cancer in her father and paternal grandmother; Colon cancer in her father and paternal aunt; Hypertension in her maternal aunt; Lupus in her paternal grandmother; Migraines in her maternal aunt and mother; Ovarian cancer in her cousin.  ROS:   Please see the history of present illness.    *** All other systems reviewed and are negative.  EKGs/Labs/Other Studies Reviewed:    The following studies were  reviewed today: ***  EKG:  EKG is *** ordered today.  The ekg ordered today demonstrates ***  Recent Labs: 04/07/2021: ALT 15 06/22/2021: B Natriuretic Peptide 61.8; Hemoglobin 9.3; Platelets 528 10/04/2021: BUN 6; Creatinine, Ser 0.91; Potassium 4.3; Sodium 139  Recent Lipid Panel    Component Value Date/Time   CHOL 147 10/04/2021 1138   TRIG 121 10/04/2021 1138   HDL 45 10/04/2021 1138   CHOLHDL 3.3 10/04/2021 1138   LDLCALC 80 10/04/2021 1138     Risk Assessment/Calculations:   {Does this patient have ATRIAL FIBRILLATION?[phone]}       Physical Exam:    VS:  There were no vitals taken for this visit.    Wt Readings from Last 3 Encounters:  10/04/21 217 lb 3.2 oz (98.5 kg)  07/17/21 211 lb 14.4 oz (96.1 kg)  07/03/21 212 lb 3.2 oz (96.3 kg)     GEN: *** Well nourished, well developed in no acute distress HEENT: Normal NECK: No JVD; No carotid bruits LYMPHATICS: No lymphadenopathy CARDIAC: ***RRR, no murmurs, rubs, gallops RESPIRATORY:  Clear to auscultation without rales, wheezing or rhonchi  ABDOMEN: Soft, non-tender, non-distended MUSCULOSKELETAL:  No edema; No deformity  SKIN: Warm and dry NEUROLOGIC:  Alert and oriented x 3 PSYCHIATRIC:  Normal affect   ASSESSMENT:    No diagnosis found. PLAN:    In order of problems listed above:  ***      {Are you ordering a CV Procedure (e.g. stress test, cath, DCCV, TEE, etc)?   Press F2        :[id number]}    Medication Adjustments/Labs and Tests Ordered: Current medicines are reviewed at length with the patient today.  Concerns regarding medicines are outlined above.  No orders of the defined types were placed in this encounter.  No orders of the defined types were placed in this encounter.   There are no Patient Instructions on file for this visit.   Signed, Janina Mayo, MD  10/09/2021 10:38 AM    [facility]

## 2021-10-13 ENCOUNTER — Ambulatory Visit: Payer: Medicaid Other | Admitting: Obstetrics and Gynecology

## 2021-10-15 ENCOUNTER — Encounter: Payer: Self-pay | Admitting: Gastroenterology

## 2021-11-01 ENCOUNTER — Ambulatory Visit: Payer: Medicaid Other | Admitting: Internal Medicine

## 2021-11-01 ENCOUNTER — Encounter: Payer: Medicaid Other | Admitting: Student

## 2021-11-05 NOTE — Progress Notes (Unsigned)
Cardiology Office Note:    Date:  11/07/2021   ID:  MUSKAAN SMET, DOB [birth year], MRN [id number]  PCP:  Sanjuan Dame, MD   [facility] Providers Cardiologist:  Janina Mayo, MD     Referring MD: Sanjuan Dame, MD   No chief complaint on file. Atypical Chest Pain  History of Present Illness:    Dana Hughes is a 41 y.o. female with a hx of PE 07/2020 she's off AC, DM2 (prior 11, now 7) referral for atypical cp  She described a presyncopal event while cooking. She notes twisting pressure in her chest. They last briefly for ~ 10 seconds. It is located in the center of her chest and can travel to the left lateral side. This is associated with exertion. She gets dyspnea.  She notes mid-sternal sharp chest pain after walking.  It lasts for 15 minutes. If she sits that eases it.  Pain level 5-6/10. This has been going on for 4 weeks. She notes some SOB. She is off her AC since her PE. She was on depot shot which was stopped and thought contributing to her PE ( provoked).  She also has iron deficiency anemia. She notes apneic events at night as well.  Cardiology Studies: TTE 08/08/2020-  EF 45-50 global. RV mild to moderately enlarged.  Past Medical History:  Diagnosis Date   Anxiety    Arthritis    Asthma    Bronchitis    BV (bacterial vaginosis)    Candidiasis    Depression    Diabetes mellitus without complication ([place])    Endometriosis    Headache(784.0)    migraines   Hypertension    no longer takes meds   Migraines    Muscle spasm    Ovarian cyst    Pelvic pain in female 09/29/2015   Scoliosis    Uterine fibroid     Past Surgical History:  Procedure Laterality Date   BIOPSY  08/12/2020   Procedure: BIOPSY;  Surgeon: Mauri Pole, MD;  Location: [place];  Service: Endoscopy;;   CHOLECYSTECTOMY     COLONOSCOPY WITH PROPOFOL N/A 08/12/2020   Procedure: COLONOSCOPY WITH PROPOFOL;  Surgeon: Mauri Pole, MD;  Location: [place]  [facility];  Service: Endoscopy;  Laterality: N/A;   ESOPHAGOGASTRODUODENOSCOPY (EGD) WITH PROPOFOL N/A 08/12/2020   Procedure: ESOPHAGOGASTRODUODENOSCOPY (EGD) WITH PROPOFOL;  Surgeon: Mauri Pole, MD;  Location: [place] [facility];  Service: Endoscopy;  Laterality: N/A;   POLYPECTOMY  08/12/2020   Procedure: POLYPECTOMY;  Surgeon: Mauri Pole, MD;  Location: [facility];  Service: Endoscopy;;   TUBAL LIGATION      Current Medications: Current Meds  Medication Sig   acetaminophen (TYLENOL) 500 MG tablet Take 2 tablets (1,000 mg total) by mouth every 6 (six) hours as needed for mild pain (or Fever >/= 101).   albuterol (PROVENTIL) (2.5 MG/3ML) 0.083% nebulizer solution Take 3 mLs (2.5 mg total) by nebulization every 6 (six) hours as needed for wheezing or shortness of breath.   albuterol (VENTOLIN HFA) 108 (90 Base) MCG/ACT inhaler Inhale 2 puffs into the lungs every 6 (six) hours as needed for wheezing or shortness of breath. Reported on 09/29/2015   ARIPiprazole (ABILIFY) 15 MG tablet Take 1 tablet (15 mg total) by mouth daily.   artificial tears (LACRILUBE) OINT ophthalmic ointment Place into both eyes at bedtime as needed for dry eyes.   diclofenac Sodium (VOLTAREN) 1 % GEL Apply 4 g topically 4 (four) times daily.  dicyclomine (BENTYL) 10 MG capsule Take 1 capsule (10 mg total) by mouth 4 (four) times daily as needed for spasms (abdominal pain).   gabapentin (NEURONTIN) 300 MG capsule Take 1 capsule (300 mg total) by mouth 3 (three) times daily.   glucose blood test strip Check your sugar in the morning before you eat breakfast, and one hour after a meal.   glucose monitoring kit (FREESTYLE) monitoring kit 1 each by Does not apply route daily. Check glucose once in the morning before breakfast and 1 hour after a meal   hydrOXYzine (ATARAX) 25 MG tablet Take 1 tablet (25 mg total) by mouth 3 (three) times daily as needed.   insulin aspart (NOVOLOG) 100 UNIT/ML FlexPen Inject 10 Units  into the skin 2 (two) times daily with a meal.   insulin glargine (LANTUS) 100 UNIT/ML Solostar Pen Inject 20 Units into the skin daily at 10pm.   Insulin Pen Needle 32G X 4 MM MISC Use as directed 4 times daily.   lamoTRIgine (LAMICTAL) 150 MG tablet Take 1 tablet (150 mg total) by mouth daily.   meclizine (ANTIVERT) 25 MG tablet Take 1 tablet (25 mg total) by mouth 3 (three) times daily as needed for dizziness.   metFORMIN (GLUCOPHAGE) 1000 MG tablet Take 1 tablet (1,000 mg total) by mouth 2 (two) times daily with a meal.   metroNIDAZOLE (FLAGYL) 500 MG tablet Take 1 tablet (500 mg total) by mouth 2 (two) times daily.   mirtazapine (REMERON) 45 MG tablet Take 1 tablet (45 mg total) by mouth at bedtime.   oxyCODONE (OXY IR/ROXICODONE) 5 MG immediate release tablet Take 1 tablet (5 mg total) by mouth 2 (two) times daily as needed for severe pain.   senna-docusate (SENOKOT S) 8.6-50 MG tablet Take 1 tablet by mouth daily.   SUMAtriptan (IMITREX) 50 MG tablet Take 1 tablet (50 mg total) by mouth every 2 (two) hours as needed for migraine. May repeat in 2 hours if headache persists or recurs. Please take no more than 3 tablets (150 mg) in 24 hours.     Allergies:   Mushroom extract complex, Latex, and Penicillins   Social History   Socioeconomic History   Marital status: Single    Spouse name: Not on file   Number of children: Not on file   Years of education: Not on file   Highest education level: Not on file  Occupational History   Not on file  Tobacco Use   Smoking status: Former    Types: Cigars   Smokeless tobacco: Never   Tobacco comments:    1-2 cigars/week  Vaping Use   Vaping Use: Never used  Substance and Sexual Activity   Alcohol use: Not Currently    Comment: occasional   Drug use: Yes    Frequency: 7.0 times per week    Types: Marijuana    Comment: 1 to 2 blunts a week, last use last night   Sexual activity: Yes    Birth control/protection: Condom, Surgical  Other  Topics Concern   Not on file  Social History Narrative   Not on file   Social Determinants of Health   Financial Resource Strain: Not on file  Food Insecurity: Food Insecurity Present (07/03/2021)   Hunger Vital Sign    Worried About Running Out of Food in the Last Year: Often true    Ran Out of Food in the Last Year: Sometimes true  Transportation Needs: No Transportation Needs (07/03/2021)   PRAPARE -  Hydrologist (Medical): No    Lack of Transportation (Non-Medical): No  Physical Activity: Not on file  Stress: Not on file  Social Connections: Not on file     Family History: The patient's family history includes Breast cancer in her maternal aunt, paternal aunt, and paternal grandmother; Cancer in her father and paternal grandmother; Colon cancer in her father and paternal aunt; Hypertension in her maternal aunt; Lupus in her paternal grandmother; Migraines in her maternal aunt and mother; Ovarian cancer in her cousin.  Maternal aunt with PFO?Marland Kitchen Maternal aunt with hx of ischemic heart dx  ROS:   Please see the history of present illness.     All other systems reviewed and are negative.  EKGs/Labs/Other Studies Reviewed:    The following studies were reviewed today:   EKG:  EKG is  ordered today.  The ekg ordered today demonstrates   11/07/2021-NSR  Recent Labs: 04/07/2021: ALT 15 06/22/2021: B Natriuretic Peptide 61.8; Hemoglobin 9.3; Platelets 528 10/04/2021: BUN 6; Creatinine, Ser 0.91; Potassium 4.3; Sodium 139  Recent Lipid Panel    Component Value Date/Time   CHOL 147 10/04/2021 1138   TRIG 121 10/04/2021 1138   HDL 45 10/04/2021 1138   CHOLHDL 3.3 10/04/2021 1138   LDLCALC 80 10/04/2021 1138     Risk Assessment/Calculations:           Physical Exam:    VS:    Vitals:   11/07/21 1023  BP: 128/90  Pulse: 82  SpO2: 100%     Wt Readings from Last 3 Encounters:  11/07/21 215 lb 9.6 oz (97.8 kg)  10/04/21 217 lb 3.2 oz  (98.5 kg)  07/17/21 211 lb 14.4 oz (96.1 kg)     GEN:  Well nourished, well developed in no acute distress HEENT: Normal NECK: No JVD; No carotid bruits LYMPHATICS: No lymphadenopathy CARDIAC: RRR, no murmurs, rubs, gallops RESPIRATORY:  Clear to auscultation without rales, wheezing or rhonchi  ABDOMEN: Soft, non-tender, non-distended MUSCULOSKELETAL:  No edema; No deformity  SKIN: Warm and dry NEUROLOGIC:  Alert and oriented x 3 PSYCHIATRIC:  Normal affect   ASSESSMENT:    Atypical CP: She notes short duration of chest pain; however it is associated with exertion and she also describes dsypnea. She has no prior CAD, she is intermediate risk with DM2. Will obtain coronary CTA. With contrast will be able to assess for recurrent PE as well. If she has recurrent PE recommend hypercoag work up and restarting AC.  HLD: start atorvastatin 20 mg daily with DM2 PLAN:    In order of problems listed above:  Coronary CTA Sleep study Follow up in 6 months         Medication Adjustments/Labs and Tests Ordered: Current medicines are reviewed at length with the patient today.  Concerns regarding medicines are outlined above.  Orders Placed This Encounter  Procedures   EKG 12-Lead   No orders of the defined types were placed in this encounter.   Patient Instructions  Medication Instructions:  PLEASE TAKE METOPROLOL TARTRATE 164m TWO HOURS PRIOR TO CCTA SCAN  *If you need a refill on your cardiac medications before your next appointment, please call your pharmacy*  Lab Work: BMET- TODAY If you have labs (blood work) drawn today and your tests are completely normal, you will receive your results only by: [place](if you have MyChart) OR A paper copy in the mail If you have any lab test that is abnormal or  we need to change your treatment, we will call you to review the results.  Testing/Procedures:   Your cardiac CT will be scheduled at one of the below locations:    [facility] [address] [place], [place] [address] [phone]  If scheduled at [facility], please arrive at the [facility] and Children's Entrance (Entrance C2) of [facility] 30 minutes prior to test start time. You can use the FREE valet parking offered at entrance C (encouraged to control the heart rate for the test)  Proceed to the [facility] Radiology Department (first floor) to check-in and test prep.  All radiology patients and guests should use entrance C2 at [facility], accessed from [facility], even though the hospital's physical address listed is [address].    Please follow these instructions carefully (unless otherwise directed):  On the Night Before the Test: Be sure to Drink plenty of water. Do not consume any caffeinated/decaffeinated beverages or chocolate 12 hours prior to your test. Do not take any antihistamines 12 hours prior to your test. If the patient has contrast allergy:  On the Day of the Test: Drink plenty of water until 1 hour prior to the test. Do not eat any food 4 hours prior to the test. You may take your regular medications prior to the test.  Take metoprolol (Lopressor) two hours prior to test. HOLD Furosemide/Hydrochlorothiazide morning of the test. FEMALES- please wear underwire-free bra if available, avoid dresses & tight clothing      After the Test: Drink plenty of water. After receiving IV contrast, you may experience a mild flushed feeling. This is normal. On occasion, you may experience a mild rash up to 24 hours after the test. This is not dangerous. If this occurs, you can take Benadryl 25 mg and increase your fluid intake. If you experience trouble breathing, this can be serious. If it is severe call 911 IMMEDIATELY. If it is mild, please call our office. If you take any of these medications: Glipizide/Metformin, Avandament, Glucavance, please do not take 48 hours  after completing test unless otherwise instructed.  We will call to schedule your test 2-4 weeks out understanding that some insurance companies will need an authorization prior to the service being performed.   For non-scheduling related questions, please contact the cardiac imaging nurse navigator should you have any questions/concerns: Marchia Bond, Cardiac Imaging Nurse Navigator Gordy Clement, Cardiac Imaging Nurse Navigator [facility] Direct Office Dial: [phone]   For scheduling needs, including cancellations and rescheduling, please call [place], [phone].  Follow-Up: At [facility], you and your health needs are our priority.  As part of our continuing mission to provide you with exceptional heart care, we have created designated Provider Care Teams.  These Care Teams include your primary Cardiologist (physician) and Advanced Practice Providers (APPs -  Physician Assistants and Nurse Practitioners) who all work together to provide you with the care you need, when you need it.  Your next appointment:   6 month(s)  The format for your next appointment:   In Person  Provider:   Janina Mayo, MD            Signed, Janina Mayo, MD  11/07/2021 11:05 AM    [place]

## 2021-11-06 ENCOUNTER — Telehealth: Payer: Self-pay | Admitting: *Deleted

## 2021-11-06 NOTE — Telephone Encounter (Signed)
RTC to patient.  Continues to have the Chest discomfort from time to time.  Encouraged to go to the ER. For follow up.  Patient has an appointment with the Cardiologist on tomorrow morning.  States will keep that appointment if the discomfort continues or worsens will go to the ER.  Patient also was given her rescheduled appointment in the Clinics.

## 2021-11-07 ENCOUNTER — Other Ambulatory Visit: Payer: Self-pay

## 2021-11-07 ENCOUNTER — Ambulatory Visit (INDEPENDENT_AMBULATORY_CARE_PROVIDER_SITE_OTHER): Payer: Self-pay | Admitting: Internal Medicine

## 2021-11-07 ENCOUNTER — Encounter: Payer: Self-pay | Admitting: Internal Medicine

## 2021-11-07 VITALS — BP 128/90 | HR 82 | Ht 63.5 in | Wt 215.6 lb

## 2021-11-07 DIAGNOSIS — R079 Chest pain, unspecified: Secondary | ICD-10-CM

## 2021-11-07 DIAGNOSIS — R072 Precordial pain: Secondary | ICD-10-CM

## 2021-11-07 DIAGNOSIS — R0681 Apnea, not elsewhere classified: Secondary | ICD-10-CM

## 2021-11-07 MED ORDER — METOPROLOL TARTRATE 100 MG PO TABS
100.0000 mg | ORAL_TABLET | Freq: Once | ORAL | 0 refills | Status: DC
  Filled 2021-11-07 – 2021-12-18 (×2): qty 1, 1d supply, fill #0

## 2021-11-07 NOTE — Patient Instructions (Signed)
Medication Instructions:  PLEASE TAKE METOPROLOL TARTRATE '100mg'$  TWO HOURS PRIOR TO CCTA SCAN  *If you need a refill on your cardiac medications before your next appointment, please call your pharmacy*  Lab Work: BMET- TODAY If you have labs (blood work) drawn today and your tests are completely normal, you will receive your results only by: [place] (if you have MyChart) OR A paper copy in the mail If you have any lab test that is abnormal or we need to change your treatment, we will call you to review the results.  Testing/Procedures:   Your cardiac CT will be scheduled at one of the below locations:   [facility] [address] [place], [place] [address] [phone]  If scheduled at [facility], please arrive at the [facility] and Children's Entrance (Entrance C2) of [facility] 30 minutes prior to test start time. You can use the FREE valet parking offered at entrance C (encouraged to control the heart rate for the test)  Proceed to the [facility] Radiology Department (first floor) to check-in and test prep.  All radiology patients and guests should use entrance C2 at [facility], accessed from [facility], even though the hospital's physical address listed is [address].    Please follow these instructions carefully (unless otherwise directed):  On the Night Before the Test: Be sure to Drink plenty of water. Do not consume any caffeinated/decaffeinated beverages or chocolate 12 hours prior to your test. Do not take any antihistamines 12 hours prior to your test. If the patient has contrast allergy:  On the Day of the Test: Drink plenty of water until 1 hour prior to the test. Do not eat any food 4 hours prior to the test. You may take your regular medications prior to the test.  Take metoprolol (Lopressor) two hours prior to test. HOLD Furosemide/Hydrochlorothiazide morning of the test. FEMALES- please  wear underwire-free bra if available, avoid dresses & tight clothing      After the Test: Drink plenty of water. After receiving IV contrast, you may experience a mild flushed feeling. This is normal. On occasion, you may experience a mild rash up to 24 hours after the test. This is not dangerous. If this occurs, you can take Benadryl 25 mg and increase your fluid intake. If you experience trouble breathing, this can be serious. If it is severe call 911 IMMEDIATELY. If it is mild, please call our office. If you take any of these medications: Glipizide/Metformin, Avandament, Glucavance, please do not take 48 hours after completing test unless otherwise instructed.  We will call to schedule your test 2-4 weeks out understanding that some insurance companies will need an authorization prior to the service being performed.   For non-scheduling related questions, please contact the cardiac imaging nurse navigator should you have any questions/concerns: Marchia Bond, Cardiac Imaging Nurse Navigator Gordy Clement, Cardiac Imaging Nurse Navigator [place] [facility] Direct Office Dial: [phone]   For scheduling needs, including cancellations and rescheduling, please call [place], [phone].  Follow-Up: At [facility], you and your health needs are our priority.  As part of our continuing mission to provide you with exceptional heart care, we have created designated Provider Care Teams.  These Care Teams include your primary Cardiologist (physician) and Advanced Practice Providers (APPs -  Physician Assistants and Nurse Practitioners) who all work together to provide you with the care you need, when you need it.  Your next  appointment:   6 month(s)  The format for your next appointment:   In Person  Provider:   Janina Mayo, MD

## 2021-11-08 ENCOUNTER — Telehealth: Payer: Self-pay | Admitting: Licensed Clinical Social Worker

## 2021-11-08 LAB — BASIC METABOLIC PANEL
BUN/Creatinine Ratio: 6 — ABNORMAL LOW (ref 9–23)
BUN: 5 mg/dL — ABNORMAL LOW (ref 6–24)
CO2: 21 mmol/L (ref 20–29)
Calcium: 9.8 mg/dL (ref 8.7–10.2)
Chloride: 104 mmol/L (ref 96–106)
Creatinine, Ser: 0.87 mg/dL (ref 0.57–1.00)
Glucose: 179 mg/dL — ABNORMAL HIGH (ref 70–99)
Potassium: 3.8 mmol/L (ref 3.5–5.2)
Sodium: 139 mmol/L (ref 134–144)
eGFR: 86 mL/min/{1.73_m2} (ref >59)

## 2021-11-08 NOTE — Progress Notes (Signed)
Heart and Vascular Care Navigation  11/08/2021  Dana Hughes [birth year] [id number]  Reason for Referral:  Patient is participating in a Managed Medicaid Plan: No, self pay only [facility] Family Planning)  Engaged with patient by telephone for initial visit for Heart and Vascular Care Coordination.                                                                                                   Assessment:                                     LCSW called pt this afternoon, introduced self, role, reason for call. Was able to reach pt at [phone].  Pt confirmed home address, PCP and emergency contacts. Pt confirmed she currently only has Medicaid Family Planning. Frustrated as she had applied for disability and Medicaid- was denied for the former and only approved for [employer] for the latter. Pt has since re-applied for disability and according to portal this is still pending. Her current health has made it hard to work formal job but she has been able to maintain some cash income through [employer], occupational. Pt phone was breaking up during assessment so from what I gather she has been able to make ends meet, does not have any turn off notices etc. She receives SNAP, does not have any open bank accounts after they were frauded previously. She is interested in [employer] and [employer] and understands both I and her PCP office can assist with making copies or submitting this for her. No additional questions or concerns voiced at this time.    HRT/VAS Care Coordination     Patients Home Cardiology Office [place] Team Social Worker   Social Worker Name: Valeda Malm, [facility] [phone]   Living arrangements for the past 2 months Apartment   Lives with: Self   Patient Current Insurance Coverage Self-Pay   Patient Has Concern With Paying Medical Bills Yes   Patient Concerns With Medical Bills Medicaid Family Planning only   Medical  Bill Referrals: CAFA and [employer]   Does Patient Have Prescription Coverage? No   Patient Prescription Assistance Programs [place] [facility]   [place] [facility] Medications mailed pt application   Home Assistive Devices/Equipment None       Social History:                                                                             SDOH Screenings   Alcohol Screen: Not on file  Depression (PHQ2-9): Medium Risk (10/04/2021)   Depression (PHQ2-9)    PHQ-2 Score: 16  Financial Resource Strain: Medium Risk (11/08/2021)   Overall Financial Resource Strain (CARDIA)  Difficulty of Paying Living Expenses: Somewhat hard  Food Insecurity: Food Insecurity Present (11/08/2021)   Hunger Vital Sign    Worried About Running Out of Food in the Last Year: Sometimes true    Ran Out of Food in the Last Year: Sometimes true  Housing: Low Risk  (11/08/2021)   Housing    Last Housing Risk Score: 0  Physical Activity: Not on file  Social Connections: Not on file  Stress: Not on file  Tobacco Use: Medium Risk (11/07/2021)   Patient History    Smoking Tobacco Use: Former    Smokeless Tobacco Use: Never    Passive Exposure: Not on file  Transportation Needs: No Transportation Needs (11/08/2021)   PRAPARE - Hydrologist (Medical): No    Lack of Transportation (Non-Medical): No    SDOH Interventions: Financial Resources:  Financial Strain Interventions: Other (Comment) ([employer]FA, [employer], [employer])  Food Insecurity:  Food Insecurity Interventions:  (Pt receives [employer], offered additional resources)  Housing Insecurity:  Housing Interventions: Intervention Not Indicated  Transportation:   Transportation Interventions: Intervention Not Indicated   Other Care Navigation Interventions:     Provided Pharmacy assistance resources [place] [facility]   Follow-up plan:   LCSW has mailed my card, CAFA, [employer], and [employer]. I will f/u to ensure pt has received these and  answer any additional questions/concerns that may arise.

## 2021-11-15 ENCOUNTER — Encounter: Payer: Medicaid Other | Admitting: Internal Medicine

## 2021-11-16 ENCOUNTER — Telehealth: Payer: Medicaid Other | Admitting: Obstetrics and Gynecology

## 2021-11-16 ENCOUNTER — Telehealth: Payer: Self-pay

## 2021-11-16 NOTE — Telephone Encounter (Signed)
Called pt x 2, 10 minutes apart, to begin virtual visit. No answer. VM left stating patient will need to reschedule.

## 2021-11-20 ENCOUNTER — Other Ambulatory Visit: Payer: Self-pay

## 2021-11-21 ENCOUNTER — Telehealth: Payer: Self-pay | Admitting: Licensed Clinical Social Worker

## 2021-11-21 NOTE — Telephone Encounter (Signed)
H&V Care Navigation CSW Progress Note  Clinical Social Worker contacted patient by phone to f/u on assistance applications. Initially left a voicemail for pt. She returned my call, shares her son has been getting the mail- she does not see my envelope, agreeable to me mailing it again, we reviewed her upcoming appts, some confusion about who she is seeing when. I have mailed again my card, [employer], [employer], [employer] applications. Also mailed pt a copy of her upcoming appts in August.  Patient is participating in a Managed Medicaid Plan:  No, self pay only (Medicaid Family Planning)  SDOH Screenings   Alcohol Screen: Not on file  Depression (PHQ2-9): Medium Risk (10/04/2021)   Depression (PHQ2-9)    PHQ-2 Score: 16  Financial Resource Strain: Medium Risk (11/08/2021)   Overall Financial Resource Strain (CARDIA)    Difficulty of Paying Living Expenses: Somewhat hard  Food Insecurity: Food Insecurity Present (11/08/2021)   Hunger Vital Sign    Worried About Running Out of Food in the Last Year: Sometimes true    Ran Out of Food in the Last Year: Sometimes true  Housing: Low Risk  (11/08/2021)   Housing    Last Housing Risk Score: 0  Physical Activity: Not on file  Social Connections: Not on file  Stress: Not on file  Tobacco Use: Medium Risk (11/07/2021)   Patient History    Smoking Tobacco Use: Former    Smokeless Tobacco Use: Never    Passive Exposure: Not on file  Transportation Needs: No Transportation Needs (11/08/2021)   PRAPARE - Transportation    Lack of Transportation (Medical): No    Lack of Transportation (Non-Medical): No   Westley Hummer, MSW, [place]  [phone]- work cell phone (preferred) [phone]- desk phone

## 2021-11-27 ENCOUNTER — Telehealth (HOSPITAL_COMMUNITY): Payer: Self-pay | Admitting: Emergency Medicine

## 2021-11-27 NOTE — Telephone Encounter (Signed)
Attempted to call patient regarding upcoming cardiac CT appointment. °Left message on voicemail with name and callback number °Khalil Szczepanik RN Navigator Cardiac Imaging °[facility] °[phone] Office °[phone] Cell ° °

## 2021-11-28 ENCOUNTER — Ambulatory Visit (HOSPITAL_COMMUNITY): Payer: Medicaid Other

## 2021-12-01 ENCOUNTER — Encounter: Payer: Medicaid Other | Admitting: Internal Medicine

## 2021-12-07 ENCOUNTER — Institutional Professional Consult (permissible substitution): Payer: Medicaid Other | Admitting: Primary Care

## 2021-12-13 ENCOUNTER — Telehealth: Payer: Self-pay | Admitting: Licensed Clinical Social Worker

## 2021-12-13 NOTE — Telephone Encounter (Signed)
H&V Care Navigation CSW Progress Note  Clinical Social Worker contacted patient by phone to f/u on new applications sent to her. Pt answered at [phone]. I introduced self, role, reason for call. Pt shares she has received paperwork. She "put them away for now," I encouraged her to begin to complete them- they have been started with guidance from this writer. We discussed cancellations/no shows and pt states she has almost all of them rescheduled. We reviewed upcoming appts and pt denied issues getting to them. I remain available should pt want any additional assistance with completing those.   Patient is participating in a Managed Medicaid Plan:  No, self pay only. Medicaid Family Planning  SDOH Screenings   Alcohol Screen: Not on file  Depression (PHQ2-9): High Risk (10/04/2021)   Depression (PHQ2-9)    PHQ-2 Score: 16  Financial Resource Strain: Medium Risk (11/08/2021)   Overall Financial Resource Strain (CARDIA)    Difficulty of Paying Living Expenses: Somewhat hard  Food Insecurity: Food Insecurity Present (11/08/2021)   Hunger Vital Sign    Worried About Running Out of Food in the Last Year: Sometimes true    Ran Out of Food in the Last Year: Sometimes true  Housing: Low Risk  (11/08/2021)   Housing    Last Housing Risk Score: 0  Physical Activity: Not on file  Social Connections: Not on file  Stress: Not on file  Tobacco Use: Medium Risk (11/07/2021)   Patient History    Smoking Tobacco Use: Former    Smokeless Tobacco Use: Never    Passive Exposure: Not on file  Transportation Needs: No Transportation Needs (11/08/2021)   PRAPARE - Transportation    Lack of Transportation (Medical): No    Lack of Transportation (Non-Medical): No   Westley Hummer, MSW, [place]  [phone]- work cell phone (preferred) [phone]- desk phone

## 2021-12-14 ENCOUNTER — Ambulatory Visit: Payer: Medicaid Other | Admitting: Gastroenterology

## 2021-12-15 ENCOUNTER — Telehealth (HOSPITAL_COMMUNITY): Payer: Self-pay | Admitting: Emergency Medicine

## 2021-12-15 NOTE — Telephone Encounter (Signed)
Reaching out to patient to offer assistance regarding upcoming cardiac imaging study; pt verbalizes understanding of appt date/time, parking situation and where to check in, pre-test NPO status and medications ordered, and verified current allergies; name and call back number provided for further questions should they arise Marchia Bond RN [place] and [facility] [phone] office [phone] cell  Arrival 230 w/c entrance Difficult iv '100mg'$  metoprolol tartrate

## 2021-12-18 ENCOUNTER — Other Ambulatory Visit: Payer: Self-pay

## 2021-12-18 ENCOUNTER — Ambulatory Visit (HOSPITAL_COMMUNITY)
Admission: RE | Admit: 2021-12-18 | Discharge: 2021-12-18 | Disposition: A | Discharge status: Home / Self Care | Payer: Medicaid Other | Source: Ambulatory Visit | Attending: Internal Medicine | Admitting: Internal Medicine

## 2021-12-18 DIAGNOSIS — R072 Precordial pain: Secondary | ICD-10-CM | POA: Insufficient documentation

## 2021-12-18 MED ORDER — NITROGLYCERIN 0.4 MG SL SUBL
0.8000 mg | SUBLINGUAL_TABLET | Freq: Once | SUBLINGUAL | Status: AC
Start: 2021-12-18 — End: 2021-12-18
  Administered 2021-12-18: 0.8 mg via SUBLINGUAL

## 2021-12-18 MED ORDER — NITROGLYCERIN 0.4 MG SL SUBL
SUBLINGUAL_TABLET | SUBLINGUAL | Status: AC
  Filled 2021-12-18: qty 2

## 2021-12-18 MED ORDER — IOHEXOL 350 MG/ML SOLN
100.0000 mL | Freq: Once | INTRAVENOUS | Status: AC | PRN
  Administered 2021-12-18: 100 mL via INTRAVENOUS

## 2021-12-18 MED ORDER — METOPROLOL TARTRATE 5 MG/5ML IV SOLN
INTRAVENOUS | Status: AC
  Filled 2021-12-18: qty 10

## 2021-12-19 ENCOUNTER — Encounter: Payer: Self-pay | Admitting: *Deleted

## 2021-12-20 ENCOUNTER — Institutional Professional Consult (permissible substitution): Payer: Medicaid Other | Admitting: Primary Care

## 2022-01-01 ENCOUNTER — Encounter (HOSPITAL_COMMUNITY): Payer: Self-pay | Admitting: Psychiatry

## 2022-01-01 ENCOUNTER — Telehealth (INDEPENDENT_AMBULATORY_CARE_PROVIDER_SITE_OTHER): Payer: No Payment, Other | Admitting: Psychiatry

## 2022-01-01 ENCOUNTER — Other Ambulatory Visit: Payer: Self-pay

## 2022-01-01 DIAGNOSIS — F411 Generalized anxiety disorder: Secondary | ICD-10-CM | POA: Diagnosis not present

## 2022-01-01 DIAGNOSIS — F3181 Bipolar II disorder: Secondary | ICD-10-CM | POA: Diagnosis not present

## 2022-01-01 MED ORDER — GABAPENTIN 300 MG PO CAPS
300.0000 mg | ORAL_CAPSULE | Freq: Three times a day (TID) | ORAL | 3 refills | Status: DC
  Filled 2022-01-01 – 2022-03-31 (×2): qty 90, 30d supply, fill #0

## 2022-01-01 MED ORDER — HYDROXYZINE HCL 25 MG PO TABS
25.0000 mg | ORAL_TABLET | Freq: Three times a day (TID) | ORAL | 3 refills | Status: DC | PRN
  Filled 2022-01-01: qty 90, 30d supply, fill #0

## 2022-01-01 MED ORDER — ARIPIPRAZOLE 20 MG PO TABS
20.0000 mg | ORAL_TABLET | Freq: Every day | ORAL | 3 refills | Status: DC
  Filled 2022-01-01 – 2022-03-31 (×2): qty 30, 30d supply, fill #0

## 2022-01-01 MED ORDER — MIRTAZAPINE 45 MG PO TABS
45.0000 mg | ORAL_TABLET | Freq: Every day | ORAL | 3 refills | Status: DC
  Filled 2022-01-01 – 2022-03-31 (×2): qty 30, 30d supply, fill #0

## 2022-01-01 MED ORDER — LAMOTRIGINE 150 MG PO TABS
150.0000 mg | ORAL_TABLET | Freq: Every day | ORAL | 3 refills | Status: DC
  Filled 2022-01-01 – 2022-03-31 (×2): qty 30, 30d supply, fill #0

## 2022-01-01 NOTE — Progress Notes (Signed)
BH MD/PA/NP OP Progress Note Virtual Visit via Video Note  I connected with Dana Hughes on 01/01/22 at  3:30 PM EDT by a video enabled telemedicine application and verified that I am speaking with the correct person using two identifiers.  Location: Patient: Home Provider: Clinic   I discussed the limitations of evaluation and management by telemedicine and the availability of in person appointments. The patient expressed understanding and agreed to proceed.  I provided 30 minutes of non-face-to-face time during this encounter.      01/01/2022 3:46 PM Dana Hughes  MRN:  [id number]  Chief Complaint:  "I have been having mood swings"   HPI: 41 year old female seen today for follow up psychiatric evaluation. She has a psychiatric history of depression, anxiety, and adjustment disorder.  She is currently managed on Mirtazapine 45 mg nightly, Abilify 15 mg daily, hydroxyzine 25 mg three times daily as needed, Lamictal 150 mg daily.   She inform her that her  medications are somewhat effective in managing her psychiatric conditions.     Today she was well groomed, pleasant, cooperative, engaged in conversation, and maintained eye contact.  She informed Probation officer that she has been having mood swings.  She reports that she has been experiencing symptoms of hypomania such as patient in mood, distractibility, irritability, and racing thoughts.  Patient informed Probation officer that most of her irritability is direct at towards the fathers of her children.  She notes that at times they are not as active with her sons as she would like them to be.  Patient notes that to help control her irritability she walks and counts.  She asked Probation officer about other techniques.  Provider recommended sensory techniques.  She endorsed understanding and agreed.  Patient notes that her sleep fluctuates noting that she gets approximately 5 hours nightly of sleep.  She also notes that her appetite fluctuates but denies recent  weight gain/loss.    Patient informed Probation officer that she continues to be anxious.  She notes that she worries about her health (heart, lungs, and kidney).  She informed Probation officer that her PCP wrote her out of work because she had fluid on her heart.  Provider conducted a GAD-7 and patient scored a 20, at her last visit she scored an 18.  Provider also conducted PHQ-9 and patient scored a17, at her last visit she scored an 7.  Today she denies SI/HI.  She does endorse visual and tactile hallucinations.  Patient notes that she feels her bed shaking and has felt someone tapping her shoulder.  She informed Probation officer that she has cut her marijuana consumption down to 1 blunt a week.  Patient informed writer that at night she had twitching of her body parts.  Provider conducted an aims assessment and patient scored a 0.  She notes that this is only happened on a few occasions.  Today she is agreeable to increasing Abilify 15 mg to 20 mg to help manage mood and symptoms of psychosis. Patient notes that at times gabapentin makes her sleepy.  Provider recommended taking 2 tablets of gabapentin at night to help manage sleep and 1 in the afternoon.  She will continue all other medications as prescribed.  No other concerns at this time.   Visit Diagnosis:    ICD-10-CM   1. Bipolar 2 disorder, major depressive episode (HCC)  F31.81 ARIPiprazole (ABILIFY) 20 MG tablet    lamoTRIgine (LAMICTAL) 150 MG tablet    gabapentin (NEURONTIN) 300 MG capsule  mirtazapine (REMERON) 45 MG tablet    2. Generalized anxiety disorder  F41.1 gabapentin (NEURONTIN) 300 MG capsule    mirtazapine (REMERON) 45 MG tablet    hydrOXYzine (ATARAX) 25 MG tablet      Past Psychiatric History: depression, anxiety, and adjustment disorder.  Past Medical History:  Past Medical History:  Diagnosis Date   Anxiety    Arthritis    Asthma    Bronchitis    BV (bacterial vaginosis)    Candidiasis    Depression    Diabetes mellitus without  complication ([place])    Endometriosis    Headache(784.0)    migraines   Hypertension    no longer takes meds   Migraines    Muscle spasm    Ovarian cyst    Pelvic pain in female 09/29/2015   Scoliosis    Uterine fibroid     Past Surgical History:  Procedure Laterality Date   BIOPSY  08/12/2020   Procedure: BIOPSY;  Surgeon: Mauri Pole, MD;  Location: [facility];  Service: Endoscopy;;   CHOLECYSTECTOMY     COLONOSCOPY WITH PROPOFOL N/A 08/12/2020   Procedure: COLONOSCOPY WITH PROPOFOL;  Surgeon: Mauri Pole, MD;  Location: [facility];  Service: Endoscopy;  Laterality: N/A;   ESOPHAGOGASTRODUODENOSCOPY (EGD) WITH PROPOFOL N/A 08/12/2020   Procedure: ESOPHAGOGASTRODUODENOSCOPY (EGD) WITH PROPOFOL;  Surgeon: Mauri Pole, MD;  Location: [facility];  Service: Endoscopy;  Laterality: N/A;   POLYPECTOMY  08/12/2020   Procedure: POLYPECTOMY;  Surgeon: Mauri Pole, MD;  Location: [facility];  Service: Endoscopy;;   TUBAL LIGATION      Family Psychiatric History: Alcohol use disorder paternal and maternal family members, siblings anxiety,   Family History:  Family History  Problem Relation Age of Onset   Migraines Mother    Cancer Father    Colon cancer Father    Lupus Paternal Grandmother    Cancer Paternal Grandmother    Breast cancer Paternal Grandmother    Migraines Maternal Aunt    Hypertension Maternal Aunt    Breast cancer Maternal Aunt    Colon cancer Paternal Aunt    Breast cancer Paternal Aunt    Ovarian cancer Cousin     Social History:  Social History   Socioeconomic History   Marital status: Single    Spouse name: Not on file   Number of children: Not on file   Years of education: Not on file   Highest education level: Not on file  Occupational History   Not on file  Tobacco Use   Smoking status: Former    Types: Cigars   Smokeless tobacco: Never   Tobacco comments:    1-2 cigars/week  Vaping Use   Vaping Use: Never  used  Substance and Sexual Activity   Alcohol use: Not Currently    Comment: occasional   Drug use: Yes    Frequency: 7.0 times per week    Types: Marijuana    Comment: 1 to 2 blunts a week, last use last night   Sexual activity: Yes    Birth control/protection: Condom, Surgical  Other Topics Concern   Not on file  Social History Narrative   Not on file   Social Determinants of Health   Financial Resource Strain: Medium Risk (11/08/2021)   Overall Financial Resource Strain (CARDIA)    Difficulty of Paying Living Expenses: Somewhat hard  Food Insecurity: Food Insecurity Present (11/08/2021)   Hunger Vital Sign    Worried About Running Out of  Food in the Last Year: Sometimes true    Ran Out of Food in the Last Year: Sometimes true  Transportation Needs: No Transportation Needs (11/08/2021)   PRAPARE - Hydrologist (Medical): No    Lack of Transportation (Non-Medical): No  Physical Activity: Not on file  Stress: Not on file  Social Connections: Not on file    Allergies:  Allergies  Allergen Reactions   Mushroom Extract Complex Anaphylaxis   Latex Itching and Swelling   Penicillins Itching    Has patient had a PCN reaction causing immediate rash, facial/tongue/throat swelling, SOB or lightheadedness with hypotension: No Has patient had a PCN reaction causing severe rash involving mucus membranes or skin necrosis: No Has patient had a PCN reaction that required hospitalization No Has patient had a PCN reaction occurring within the last 10 years: No If all of the above answers are "NO", then may proceed with Cephalosporin use.     Metabolic Disorder Labs: Lab Results  Component Value Date   HGBA1C 7.7 (A) 10/04/2021   MPG 151.33 06/22/2021   MPG 271.87 11/12/2019   No results found for: "PROLACTIN" Lab Results  Component Value Date   CHOL 147 10/04/2021   TRIG 121 10/04/2021   HDL 45 10/04/2021   CHOLHDL 3.3 10/04/2021   [place] 80  10/04/2021   Lab Results  Component Value Date   TSH 1.279 05/29/2017    Therapeutic Level Labs: No results found for: "LITHIUM" No results found for: "VALPROATE" No results found for: "CBMZ"  Current Medications: Current Outpatient Medications  Medication Sig Dispense Refill   acetaminophen (TYLENOL) 500 MG tablet Take 2 tablets (1,000 mg total) by mouth every 6 (six) hours as needed for mild pain (or Fever >/= 101). 30 tablet 0   albuterol (PROVENTIL) (2.5 MG/3ML) 0.083% nebulizer solution Take 3 mLs (2.5 mg total) by nebulization every 6 (six) hours as needed for wheezing or shortness of breath. 75 mL 0   albuterol (VENTOLIN HFA) 108 (90 Base) MCG/ACT inhaler Inhale 2 puffs into the lungs every 6 (six) hours as needed for wheezing or shortness of breath. Reported on 09/29/2015     ARIPiprazole (ABILIFY) 20 MG tablet Take 1 tablet (20 mg total) by mouth daily. 30 tablet 3   artificial tears (LACRILUBE) OINT ophthalmic ointment Place into both eyes at bedtime as needed for dry eyes. 3.5 g 0   diclofenac Sodium (VOLTAREN) 1 % GEL Apply 4 g topically 4 (four) times daily. 100 g 0   dicyclomine (BENTYL) 10 MG capsule Take 1 capsule (10 mg total) by mouth 4 (four) times daily as needed for spasms (abdominal pain). 30 capsule 1   ferrous sulfate 325 (65 FE) MG tablet Take 1 tablet (325 mg total) by mouth every other day. 30 tablet 3   fluticasone (FLONASE) 50 MCG/ACT nasal spray PLACE 1 SPRAY INTO BOTH NOSTRILS IN THE MORNING AND AT BEDTIME. 16 g 1   gabapentin (NEURONTIN) 300 MG capsule Take 1 capsule (300 mg total) by mouth 3 (three) times daily. 90 capsule 3   glucose blood test strip Check your sugar in the morning before you eat breakfast, and one hour after a meal. 100 each 2   glucose monitoring kit (FREESTYLE) monitoring kit 1 each by Does not apply route daily. Check glucose once in the morning before breakfast and 1 hour after a meal 1 each 0   guaifenesin (ROBITUSSIN) 100 MG/5ML syrup  Take 200 mg by mouth 3 (  three) times daily as needed for cough.     hydrOXYzine (ATARAX) 25 MG tablet Take 1 tablet (25 mg total) by mouth 3 (three) times daily as needed. 90 tablet 3   insulin aspart (NOVOLOG) 100 UNIT/ML FlexPen Inject 10 Units into the skin 2 (two) times daily with a meal. 6 mL 0   insulin glargine (LANTUS) 100 UNIT/ML Solostar Pen Inject 20 Units into the skin daily at 10pm. 6 mL 0   Insulin Pen Needle 32G X 4 MM MISC Use as directed 4 times daily. 200 each 0   lamoTRIgine (LAMICTAL) 150 MG tablet Take 1 tablet (150 mg total) by mouth daily. 30 tablet 3   liraglutide (VICTOZA) 18 MG/3ML SOPN Inject 0.6 mg into the skin daily for 7 days, THEN 1.2 mg daily for 23 days. 6 mL 3   meclizine (ANTIVERT) 25 MG tablet Take 1 tablet (25 mg total) by mouth 3 (three) times daily as needed for dizziness. 30 tablet 0   metFORMIN (GLUCOPHAGE) 1000 MG tablet Take 1 tablet (1,000 mg total) by mouth 2 (two) times daily with a meal. 90 tablet 3   metoprolol tartrate (LOPRESSOR) 100 MG tablet TAKE 1 TABLET BY MOUTH 2 HOURS BEFORE MRI. 1 tablet 0   metroNIDAZOLE (FLAGYL) 500 MG tablet Take 1 tablet (500 mg total) by mouth 2 (two) times daily. 14 tablet 0   mirtazapine (REMERON) 45 MG tablet Take 1 tablet (45 mg total) by mouth at bedtime. 30 tablet 3   oxyCODONE (OXY IR/ROXICODONE) 5 MG immediate release tablet Take 1 tablet (5 mg total) by mouth 2 (two) times daily as needed for severe pain. 10 tablet 0   pantoprazole (PROTONIX) 40 MG tablet Take 1 tablet (40 mg total) by mouth daily. 30 tablet 0   senna-docusate (SENOKOT S) 8.6-50 MG tablet Take 1 tablet by mouth daily. 90 tablet 2   SUMAtriptan (IMITREX) 50 MG tablet Take 1 tablet (50 mg total) by mouth every 2 (two) hours as needed for migraine. May repeat in 2 hours if headache persists or recurs. Please take no more than 3 tablets (150 mg) in 24 hours. 15 tablet 0   No current facility-administered medications for this visit.      Musculoskeletal: Strength & Muscle Tone: within normal limits and telehealth visit Gait & Station: normal, telehealth visit Patient leans: N/A  Psychiatric Specialty Exam: Review of Systems  There were no vitals taken for this visit.There is no height or weight on file to calculate BMI.  General Appearance: Well Groomed  Eye Contact:  Good  Speech:  Clear and Coherent and Normal Rate  Volume:  Normal  Mood:  Anxious and Depressed  Affect:  Congruent  Thought Process:  Coherent, Goal Directed and Linear  Orientation:  Full (Time, Place, and Person)  Thought Content: Logical and Hallucinations: Auditory Tactile   Suicidal Thoughts:  No  Homicidal Thoughts:  No  Memory:  Immediate;   Good Recent;   Good Remote;   Good  Judgement:  Good  Insight:  Good  Psychomotor Activity:  Normal  Concentration:  Concentration: Good and Attention Span: Good  Recall:  Good  Fund of Knowledge: Good  Language: Good  Akathisia:  No  Handed:  Right  AIMS (if indicated): Not done  Assets:  Communication Skills Desire for Improvement Financial Resources/Insurance Housing Intimacy Social Support  ADL's:  Intact  Cognition: WNL  Sleep:  Fair   Screenings: AIMS    Flowsheet Row Video Visit from 01/01/2022  in [place] Total Score 0      GAD-7    Flowsheet Row Video Visit from 01/01/2022 in [facility] Video Visit from 09/28/2021 in [facility] Office Visit from 07/03/2021 in [facility] [place] [facility] Video Visit from 05/16/2021 in [facility] Video Visit from 04/13/2021 in [facility]  Total GAD-7 Score 20 18 21 17 20       PHQ2-9    Flowsheet Row Video Visit from 01/01/2022 in [facility] Office Visit from 10/04/2021 in [facility] Video Visit  from 09/28/2021 in [facility] Office Visit from 07/17/2021 in [place] Office Visit from 07/03/2021 in [place] [facility] [place] [facility]  PHQ-2 Total Score 5 3 4 4 5   PHQ-9 Total Score 17 16 18 16 18       Flowsheet Row Video Visit from 05/16/2021 in [facility] Video Visit from 04/13/2021 in [facility] ED from 04/07/2021 in [facility] [place] Error: Question 6 not populated Error: Q7 should not be populated when Q6 is No No Risk        Assessment and Plan: Patient endorses visual/tactile hallucinations, hypomania, anxiety,and depression.Today she is agreeable to increasing Abilify 15 mg to 20 mg to help manage mood and symptoms of psychosis.  Patient notes that at times gabapentin makes her sleepy.  Provider recommended taking 2 tablets of gabapentin at night to help manage sleep and 1 in the afternoon.  She endorsed understanding and agreed.  She will continue all other medications as prescribed.   1. Bipolar 2 disorder, major depressive episode (HCC)  Increased- ARIPiprazole (ABILIFY) 20 MG tablet; Take 1 tablet (20 mg total) by mouth daily.  Dispense: 30 tablet; Refill: 3 Continue- lamoTRIgine (LAMICTAL) 150 MG tablet; Take 1 tablet (150 mg total) by mouth daily.  Dispense: 30 tablet; Refill: 3 Continue- gabapentin (NEURONTIN) 300 MG capsule; Take 1 capsule (300 mg total) by mouth 3 (three) times daily.  Dispense: 90 capsule; Refill: 3 Continue- mirtazapine (REMERON) 45 MG tablet; Take 1 tablet (45 mg total) by mouth at bedtime.  Dispense: 30 tablet; Refill: 3  2. Generalized anxiety disorder  Continue- gabapentin (NEURONTIN) 300 MG capsule; Take 1 capsule (300 mg total) by mouth 3 (three) times daily.  Dispense: 90 capsule; Refill: 3 Continue- mirtazapine (REMERON) 45 MG tablet; Take 1 tablet (45 mg total)  by mouth at bedtime.  Dispense: 30 tablet; Refill: 3 Continue- hydrOXYzine (ATARAX) 25 MG tablet; Take 1 tablet (25 mg total) by mouth 3 (three) times daily as needed.  Dispense: 90 tablet; Refill: 3  Follow up in 3 months Follow up with therapy  Salley Slaughter, NP 01/01/2022, 3:46 PM

## 2022-01-08 ENCOUNTER — Other Ambulatory Visit: Payer: Self-pay

## 2022-01-09 ENCOUNTER — Telehealth: Payer: Self-pay | Admitting: *Deleted

## 2022-01-09 NOTE — Chronic Care Management (AMB) (Signed)
  Care Coordination   Note   01/09/2022 Name: Dana Hughes MRN: [id number] DOB: [birth year]  Dana Hughes is a 41 y.o. year old female who sees Sanjuan Dame, MD for primary care. I reached out to [name] by phone today to offer care coordination services.  [name] was given information about Care Coordination services today including:   The Care Coordination services include support from the care team which includes your Nurse Coordinator, Clinical Social Worker, or Pharmacist.  The Care Coordination team is here to help remove barriers to the health concerns and goals most important to you. Care Coordination services are voluntary, and the patient may decline or stop services at any time by request to their care team member.   Care Coordination Consent Status: Patient agreed to services and verbal consent obtained.   Follow up plan:  Telephone appointment with care coordination team member scheduled for:  01/10/22  Encounter Outcome:  Pt. Scheduled  [place]  Direct Dial: [phone]

## 2022-01-10 ENCOUNTER — Ambulatory Visit: Payer: Self-pay | Admitting: Licensed Clinical Social Worker

## 2022-01-10 NOTE — Patient Outreach (Signed)
  Care Coordination   Initial Visit Note   01/10/2022 Name: CONNIE LASATER MRN: [id number] DOB: 1980/08/19  Casidee JAIMARIE RAPOZO is a 41 y.o. year old female who sees Sanjuan Dame, MD for primary care. I spoke with  [name] by phone today.  What matters to the patients health and wellness today?  Food, Housing, Curator.    Goals Addressed               This Visit's Progress     Care Coordination activities (pt-stated)        Care Coordination Interventions: Provided education to patient re: Food, Housing and transportation Provided patient and/or caregiver with financial assitance information about medical and other bills within community Advice worker) Reviewed scheduled/upcoming provider appointments including . Social Work referral for [employer], [employer] and Engineering geologist social determinant of health barriers  Motivational Interviewing employed Engineer, petroleum Provided         SDOH assessments and interventions completed:  Yes  SDOH Interventions Today    Flowsheet Row Most Recent Value  SDOH Interventions   Food Insecurity Interventions Other (Comment)  [Emailed [place] [employer] resources.]  Housing Interventions Other (Comment)  [Patient already receiving assitance with applying for housing]  Transportation Interventions SCAT (Specialized Community Area Transporation)        Care Coordination Interventions Activated:  Yes  Care Coordination Interventions:  Yes, provided   Follow up plan: No further intervention required.   Encounter Outcome:  Pt. Visit Completed   Lenor Derrick , MSW Social Worker [facility] Care Management  [phone]

## 2022-01-10 NOTE — Patient Instructions (Signed)
Visit Information  Thank you for taking time to visit with me today. Please don't hesitate to contact me if I can be of assistance to you.   Following are the goals we discussed today:   Goals Addressed               This Visit's Progress     Care Coordination activities (pt-stated)        Care Coordination Interventions: Provided education to patient re: Food, Housing and transportation Provided patient and/or caregiver with financial assitance information about medical and other bills within community Advice worker) Reviewed scheduled/upcoming provider appointments including . Social Work referral for [employer], [employer] and [employer] Assessed social determinant of health barriers  Motivational Interviewing employed Emotional Support Provided           Please call the care guide team at [phone] if you need to cancel or reschedule your appointment.   If you are experiencing a Mental Health or [place] or need someone to talk to, please call the Suicide and Crisis Lifeline: 988   Patient verbalizes understanding of instructions and care plan provided today and agrees to view in [place]. Active MyChart status and patient understanding of how to access instructions and care plan via MyChart confirmed with patient.     The care management team will reach out to the patient again over the next 30 days.   Lenor Derrick, MSW  Social Worker [facility] Care Management  [phone]

## 2022-01-11 ENCOUNTER — Ambulatory Visit: Payer: Self-pay | Admitting: Licensed Clinical Social Worker

## 2022-01-11 ENCOUNTER — Encounter: Payer: Medicaid Other | Admitting: Student

## 2022-01-11 NOTE — Patient Outreach (Signed)
  Care Coordination   Follow Up Visit Note   01/11/2022 Name: Dana Hughes MRN: [id number] DOB: [birth year]  Dana Hughes is a 41 y.o. year old female who sees Sanjuan Dame, MD for primary care. I spoke with  Dana Hughes by phone today.  What matters to the patients health and wellness today?  Transportation   Goals Addressed               This Visit's Progress     Care Coordination activities (pt-stated)        Care Coordination Interventions: Provided education to patient re: Food, Housing and transportation Provided patient and/or caregiver with financial assitance information about medical and other bills within community Advice worker) Reviewed scheduled/upcoming provider appointments including . Social Work referral for [employer], [employer] and Solicitor Assessed social determinant of health barriers  Motivational Interviewing employed Emotional Support Provided   Patient not eligible for SCAT. SW to research other options.        SDOH assessments and interventions completed:  Yes     Care Coordination Interventions Activated:  Yes  Care Coordination Interventions:  Yes, provided   Follow up plan: Follow up call scheduled for within 30 days.    Encounter Outcome:  Pt. Visit Completed

## 2022-01-12 ENCOUNTER — Other Ambulatory Visit (HOSPITAL_COMMUNITY): Payer: Self-pay | Admitting: Student

## 2022-01-12 DIAGNOSIS — J189 Pneumonia, unspecified organism: Secondary | ICD-10-CM

## 2022-01-16 ENCOUNTER — Encounter: Payer: Self-pay | Admitting: Student

## 2022-01-16 ENCOUNTER — Telehealth: Payer: Medicaid Other | Admitting: Internal Medicine

## 2022-01-16 NOTE — Telephone Encounter (Signed)
RTC to patient -message left that the Clinics had called.

## 2022-01-16 NOTE — Telephone Encounter (Signed)
Thanks Gladys! 

## 2022-01-22 ENCOUNTER — Encounter: Payer: Medicaid Other | Admitting: Internal Medicine

## 2022-01-30 ENCOUNTER — Ambulatory Visit: Payer: Medicaid Other | Admitting: Gastroenterology

## 2022-01-31 ENCOUNTER — Ambulatory Visit: Payer: Medicaid Other | Admitting: Obstetrics and Gynecology

## 2022-02-09 ENCOUNTER — Other Ambulatory Visit: Payer: Self-pay

## 2022-02-09 ENCOUNTER — Encounter (HOSPITAL_COMMUNITY): Payer: Self-pay | Admitting: Emergency Medicine

## 2022-02-09 ENCOUNTER — Ambulatory Visit (HOSPITAL_COMMUNITY)
Admission: EM | Admit: 2022-02-09 | Discharge: 2022-02-09 | Disposition: A | Discharge status: Home / Self Care | Payer: Self-pay | Attending: Physician Assistant | Admitting: Physician Assistant

## 2022-02-09 DIAGNOSIS — E1165 Type 2 diabetes mellitus with hyperglycemia: Secondary | ICD-10-CM

## 2022-02-09 DIAGNOSIS — E1169 Type 2 diabetes mellitus with other specified complication: Secondary | ICD-10-CM

## 2022-02-09 DIAGNOSIS — E119 Type 2 diabetes mellitus without complications: Secondary | ICD-10-CM

## 2022-02-09 DIAGNOSIS — Z794 Long term (current) use of insulin: Secondary | ICD-10-CM

## 2022-02-09 DIAGNOSIS — F411 Generalized anxiety disorder: Secondary | ICD-10-CM

## 2022-02-09 DIAGNOSIS — R1013 Epigastric pain: Secondary | ICD-10-CM

## 2022-02-09 DIAGNOSIS — M545 Low back pain, unspecified: Secondary | ICD-10-CM

## 2022-02-09 LAB — CBG MONITORING, ED: Glucose-Capillary: 219 mg/dL — ABNORMAL HIGH (ref 70–99)

## 2022-02-09 MED ORDER — GLUCOSE BLOOD VI STRP
ORAL_STRIP | 2 refills | Status: DC
  Filled 2022-02-09: qty 100, 50d supply, fill #0
  Filled 2022-03-31: qty 100, 50d supply, fill #1
  Filled 2022-06-12: qty 100, 50d supply, fill #2

## 2022-02-09 MED ORDER — IBUPROFEN 600 MG PO TABS
600.0000 mg | ORAL_TABLET | Freq: Three times a day (TID) | ORAL | 0 refills | Status: DC
  Filled 2022-02-09: qty 30, 10d supply, fill #0

## 2022-02-09 MED ORDER — FREESTYLE SYSTEM KIT: 1.0000 | PACK | Freq: Every day | 0 refills | Status: DC

## 2022-02-09 MED ORDER — OMEPRAZOLE 40 MG PO CPDR
40.0000 mg | DELAYED_RELEASE_CAPSULE | Freq: Every day | ORAL | 1 refills | Status: AC
  Filled 2022-02-09: qty 30, 30d supply, fill #0
  Filled 2022-03-31: qty 30, 30d supply, fill #1

## 2022-02-09 MED ORDER — INSULIN GLARGINE 100 UNIT/ML SOLOSTAR PEN
20.0000 [IU] | PEN_INJECTOR | Freq: Every day | SUBCUTANEOUS | 0 refills | Status: DC
  Filled 2022-02-09: qty 6, 30d supply, fill #0

## 2022-02-09 MED ORDER — TRUEPLUS LANCETS 28G MISC
0 refills | Status: DC
  Filled 2022-02-09: qty 100, 50d supply, fill #0

## 2022-02-09 MED ORDER — INSULIN PEN NEEDLE 32G X 4 MM MISC
1.0000 "application " | Freq: Three times a day (TID) | 0 refills | Status: DC
  Filled 2022-02-09: qty 100, 25d supply, fill #0
  Filled 2022-03-31: qty 100, 25d supply, fill #1

## 2022-02-09 MED ORDER — INSULIN ASPART 100 UNIT/ML FLEXPEN
10.0000 [IU] | PEN_INJECTOR | Freq: Two times a day (BID) | SUBCUTANEOUS | 0 refills | Status: DC
  Filled 2022-02-09 – 2022-03-31 (×2): qty 6, fill #0
  Filled 2022-06-12: qty 6, 30d supply, fill #0

## 2022-02-09 MED ORDER — TRUE METRIX METER W/DEVICE KIT
1.0000 | PACK | Freq: Every day | 0 refills | Status: DC
  Filled 2022-02-09: qty 1, 28d supply, fill #0

## 2022-02-09 MED ORDER — HYDROXYZINE HCL 25 MG PO TABS
25.0000 mg | ORAL_TABLET | Freq: Three times a day (TID) | ORAL | 3 refills | Status: DC | PRN
  Filled 2022-02-09: qty 90, 30d supply, fill #0

## 2022-02-09 NOTE — Discharge Instructions (Addendum)
Advised to restart your insulin to help gain control of diabetes.  Prescriptions for the insulin and the pen along with the glucometer strips to be sent to the pharmacy. Advised to take the Prilosec 40 mg daily to help control the indigestion and reflux as this may be a cause of your chronic cough. Advised to take ibuprofen 600 mg every 8 hours with food to help control the lower back pain. Advised to follow-up with your internal medicine specialist over the next week or so to addressed your glucose levels. Advised return to urgent care as needed.  Glucose level today was 219.

## 2022-02-09 NOTE — ED Triage Notes (Signed)
Pt reports that had cough and lower back pains for 8 weeks. Reports her sugar was up today at work (300s) and felt weak so got sent home. Pt reports that she is out of her insulin for about week or so.

## 2022-02-09 NOTE — ED Provider Notes (Signed)
Dana Hughes    CSN: [id number] Arrival date & time: 02/09/22  1112      History   Chief Complaint Chief Complaint  Patient presents with   Cough   Back Pain   Hyperglycemia    HPI Dana Hughes is a 41 y.o. female.   41 year old female presents with fatigue lethargy, cough, and back pain.  Patient indicates for the past 2 weeks that she has been out of her insulin and has not been able to have any to help control her diabetes.  Patient indicates this morning while at work she started feeling faint, fatigued, and had the sensation like "going to pass out".  Patient indicates she was sent home by her supervisor and advised to see her doctor to have her situation evaluated.  Patient indicates that she is feeling little bit better in the exam room, the last time she checked her glucose was about a week ago and it was 300.  Patient without nausea or vomiting and she desires to have her insulin prescriptions along with needles refilled.  Patient also indicates that she has been having 2 months worth of cough, and mild congestion that is clear.  She indicates the cough is worse when she lays down at night and tries to sleep.  Indicates that she does not have hardly any coughing during the daytime.  She denies having any shortness of breath or wheezing.  She does relate having a history of indigestion, heartburn and reflux.  She relates that she was taking Protonix 40 mg daily which did not control her symptoms.  She also relates having for the past couple weeks lower back pain which is worse when she moves, bends, turns, and lifts.  She relates the pain is localized and she gets a "tearing sensation" when she tends to move and turn.  She denies having any lower back injury, that she has not have any weakness, numbness, or tingling of the lower extremities.  She relates she has been taking Tylenol and using a patch for discomfort which she got over-the-counter but these are not relieving  her symptoms.   Cough Back Pain Hyperglycemia Associated symptoms: fatigue     Past Medical History:  Diagnosis Date   Anxiety    Arthritis    Asthma    Bronchitis    BV (bacterial vaginosis)    Candidiasis    Depression    Diabetes mellitus without complication ([place])    Endometriosis    Headache(784.0)    migraines   Hypertension    no longer takes meds   Migraines    Muscle spasm    Ovarian cyst    Pelvic pain in female 09/29/2015   Scoliosis    Uterine fibroid     Patient Active Problem List   Diagnosis Date Noted   Chest pain of uncertain etiology 53/74/8270   Irregular menses 07/03/2021   Uterine fibroid    Urinary incontinence    Cystocele without uterine prolapse    Visit for screening mammogram    Exertional dyspnea 06/24/2021   Homicidal ideation 06/24/2021   Rib pain on right side 06/24/2021   Marijuana dependence ([place]) 04/13/2021   UTI (urinary tract infection) 02/01/2021   PTSD (post-traumatic stress disorder) 01/19/2021   Bipolar 2 disorder, major depressive episode ([place]) 01/11/2021   Constipation 12/10/2020   Complex ovarian cyst 12/10/2020   Atypical migraine 12/09/2020   Rectal bleeding 12/09/2020   Mild concentric left ventricular hypertrophy (LVH) 08/12/2020  History of colonic polyps 08/12/2020   Thrombocytosis 08/12/2020   History of pulmonary embolism 08/08/2020   Fibroids 05/05/2020   Iron deficiency anemia 04/08/2020   Severe episode of recurrent major depressive disorder, 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/2020   Generalized anxiety disorder 03/11/2020   Diabetes mellitus, type 2 ([place]) 12/09/2019   Carpal tunnel syndrome, bilateral 01/16/2019   Hypertension 07/21/2018   Vaginal discharge 05/15/2018   Endometriosis 08/30/2017    Past Surgical History:  Procedure Laterality Date   BIOPSY  08/12/2020   Procedure: BIOPSY;  Surgeon: Mauri Pole, MD;  Location: [facility];  Service: Endoscopy;;   CHOLECYSTECTOMY      COLONOSCOPY WITH PROPOFOL N/A 08/12/2020   Procedure: COLONOSCOPY WITH PROPOFOL;  Surgeon: Mauri Pole, MD;  Location: [facility];  Service: Endoscopy;  Laterality: N/A;   ESOPHAGOGASTRODUODENOSCOPY (EGD) WITH PROPOFOL N/A 08/12/2020   Procedure: ESOPHAGOGASTRODUODENOSCOPY (EGD) WITH PROPOFOL;  Surgeon: Mauri Pole, MD;  Location: [facility];  Service: Endoscopy;  Laterality: N/A;   POLYPECTOMY  08/12/2020   Procedure: POLYPECTOMY;  Surgeon: Mauri Pole, MD;  Location: [facility];  Service: Endoscopy;;   TUBAL LIGATION      OB History     Gravida  4   Para  3   Term  3   Preterm  0   AB  1   Living  3      SAB  1   IAB  0   Ectopic  0   Multiple  0   Live Births  3        Obstetric Comments  SVD x 3          Home Medications    Prior to Admission medications   Medication Sig Start Date End Date Taking? Authorizing Provider  ibuprofen (ADVIL) 600 MG tablet Take 1 tablet (600 mg total) by mouth 3 (three) times daily. 02/09/22  Yes Nyoka Lint, PA-C  omeprazole (PRILOSEC) 40 MG capsule Take 1 capsule (40 mg total) by mouth daily. 02/09/22  Yes Nyoka Lint, PA-C  acetaminophen (TYLENOL) 500 MG tablet Take 2 tablets (1,000 mg total) by mouth every 6 (six) hours as needed for mild pain (or Fever >/= 101). 08/12/20   Darrick Meigs, [name], MD  albuterol (PROVENTIL) (2.5 MG/3ML) 0.083% nebulizer solution Take 3 mLs (2.5 mg total) by nebulization every 6 (six) hours as needed for wheezing or shortness of breath. 07/17/21   Sanjuan Dame, MD  albuterol (VENTOLIN HFA) 108 (90 Base) MCG/ACT inhaler Inhale 2 puffs into the lungs every 6 (six) hours as needed for wheezing or shortness of breath. Reported on 09/29/2015 07/17/21   Sanjuan Dame, MD  ARIPiprazole (ABILIFY) 20 MG tablet Take 1 tablet (20 mg total) by mouth daily. 01/01/22   Salley Slaughter, NP  artificial tears (LACRILUBE) OINT ophthalmic ointment Place into both eyes at bedtime as  needed for dry eyes. 05/02/18   Langston Masker B, PA-C  diclofenac Sodium (VOLTAREN) 1 % GEL Apply 4 g topically 4 (four) times daily. 07/17/21   Sanjuan Dame, MD  dicyclomine (BENTYL) 10 MG capsule Take 1 capsule (10 mg total) by mouth 4 (four) times daily as needed for spasms (abdominal pain). 06/20/20   Jose Persia, MD  ferrous sulfate 325 (65 FE) MG tablet Take 1 tablet (325 mg total) by mouth every other day. 12/14/20 08/11/21  Sanjuan Dame, MD  fluticasone (FLONASE) 50 MCG/ACT nasal spray PLACE 1 SPRAY INTO BOTH NOSTRILS IN THE MORNING AND AT BEDTIME. 06/20/20  07/03/21  Jose Persia, MD  gabapentin (NEURONTIN) 300 MG capsule Take 1 capsule (300 mg total) by mouth 3 (three) times daily. 01/01/22   Eulis Canner E, NP  glucose blood test strip Check your sugar in the morning before you eat breakfast, and one hour after a meal. 02/09/22   Nyoka Lint, PA-C  glucose monitoring kit (FREESTYLE) monitoring kit 1 each by Does not apply route daily. Check glucose once in the morning before breakfast and 1 hour after a meal 02/09/22   Nyoka Lint, PA-C  guaifenesin (ROBITUSSIN) 100 MG/5ML syrup Take 200 mg by mouth 3 (three) times daily as needed for cough.    [provider]  insulin aspart (NOVOLOG) 100 UNIT/ML FlexPen Inject 10 Units into the skin 2 (two) times daily with a meal. 02/09/22   Nyoka Lint, PA-C  insulin glargine (LANTUS) 100 UNIT/ML Solostar Pen Inject 20 Units into the skin daily at 10pm. 02/09/22   Nyoka Lint, PA-C  Insulin Pen Needle 32G X 4 MM MISC Use as directed 4 times daily. 02/09/22   Nyoka Lint, PA-C  lamoTRIgine (LAMICTAL) 150 MG tablet Take 1 tablet (150 mg total) by mouth daily. 01/01/22   Salley Slaughter, NP  liraglutide (VICTOZA) 18 MG/3ML SOPN Inject 0.6 mg into the skin daily for 7 days, THEN 1.2 mg daily for 23 days. 12/12/20 07/03/21  Marianna Payment, MD  meclizine (ANTIVERT) 25 MG tablet Take 1 tablet (25 mg total) by mouth 3 (three) times  daily as needed for dizziness. 05/06/18   Melynda Ripple, MD  metFORMIN (GLUCOPHAGE) 1000 MG tablet Take 1 tablet (1,000 mg total) by mouth 2 (two) times daily with a meal. 12/10/20   Marianna Payment, MD  metoprolol tartrate (LOPRESSOR) 100 MG tablet TAKE 1 TABLET BY MOUTH 2 HOURS BEFORE MRI. 11/07/21 12/19/21  Janina Mayo, MD  metroNIDAZOLE (FLAGYL) 500 MG tablet Take 1 tablet (500 mg total) by mouth 2 (two) times daily. 07/05/21   Griffin Basil, MD  mirtazapine (REMERON) 45 MG tablet Take 1 tablet (45 mg total) by mouth at bedtime. 01/01/22   Salley Slaughter, NP  oxyCODONE (OXY IR/ROXICODONE) 5 MG immediate release tablet Take 1 tablet (5 mg total) by mouth 2 (two) times daily as needed for severe pain. 09/02/20   Axel Filler, MD  senna-docusate (SENOKOT S) 8.6-50 MG tablet Take 1 tablet by mouth daily. 12/14/20   Sanjuan Dame, MD  SUMAtriptan (IMITREX) 50 MG tablet Take 1 tablet (50 mg total) by mouth every 2 (two) hours as needed for migraine. May repeat in 2 hours if headache persists or recurs. Please take no more than 3 tablets (150 mg) in 24 hours. 12/10/20   Marianna Payment, MD    Family History Family History  Problem Relation Age of Onset   Migraines Mother    Cancer Father    Colon cancer Father    Lupus Paternal Grandmother    Cancer Paternal Grandmother    Breast cancer Paternal Grandmother    Migraines Maternal Aunt    Hypertension Maternal Aunt    Breast cancer Maternal Aunt    Colon cancer Paternal Aunt    Breast cancer Paternal Aunt    Ovarian cancer Cousin     Social History Social History   Tobacco Use   Smoking status: Former    Types: Cigars   Smokeless tobacco: Never   Tobacco comments:    1-2 cigars/week  Vaping Use   Vaping Use: Never used  Substance  Use Topics   Alcohol use: Not Currently    Comment: occasional   Drug use: Yes    Frequency: 7.0 times per week    Types: Marijuana    Comment: 1 to 2 blunts a week, last use last night      Allergies   Mushroom extract complex, Latex, and Penicillins   Review of Systems Review of Systems  Constitutional:  Positive for fatigue.  Respiratory:  Positive for cough.   Musculoskeletal:  Positive for back pain.     Physical Exam Triage Vital Signs ED Triage Vitals  Enc Vitals Group     BP 02/09/22 1159 123/85     Pulse Rate 02/09/22 1159 68     Resp 02/09/22 1159 15     Temp 02/09/22 1159 98 F (36.7 C)     Temp src --      SpO2 02/09/22 1159 98 %     Weight --      Height --      Head Circumference --      Peak Flow --      Pain Score 02/09/22 1158 8     Pain Loc --      Pain Edu? --      Excl. in [place]? --    No data found.  Updated Vital Signs BP 123/85 (BP Location: Left Arm)   Pulse 68   Temp 98 F (36.7 C)   Resp 15   LMP 02/04/2022   SpO2 98%   Visual Acuity Right Eye Distance:   Left Eye Distance:   Bilateral Distance:    Right Eye Near:   Left Eye Near:    Bilateral Near:     Physical Exam Constitutional:      Appearance: Normal appearance.  HENT:     Right Ear: Tympanic membrane and ear canal normal.     Left Ear: Tympanic membrane and ear canal normal.     Mouth/Throat:     Mouth: Mucous membranes are moist.     Pharynx: Oropharynx is clear.  Cardiovascular:     Rate and Rhythm: Normal rate and regular rhythm.     Heart sounds: Normal heart sounds.  Pulmonary:     Effort: Pulmonary effort is normal.     Breath sounds: Normal breath sounds and air entry. No wheezing, rhonchi or rales.  Abdominal:     General: Abdomen is flat. Bowel sounds are normal.     Palpations: Abdomen is soft.     Tenderness: There is abdominal tenderness in the epigastric area. There is no guarding.  Musculoskeletal:       Back:     Comments: Back: Pain is palpated along the L3-L5 area midline, no unusual redness or swelling.  Range of motion is limited with pain on bending turning and reaching.  Negative straight leg raise bilaterally, strength is  intact bilaterally.  Lymphadenopathy:     Cervical: No cervical adenopathy.  Neurological:     Mental Status: She is alert.      UC Treatments / Results  Labs (all labs ordered are listed, but only abnormal results are displayed) Labs Reviewed  CBG MONITORING, ED - Abnormal; Notable for the following components:      Result Value   Glucose-Capillary 219 (*)    All other components within normal limits    EKG   Radiology No results found.  Procedures Procedures (including critical care time)  Medications Ordered in UC Medications - No data to display  Initial Impression / Assessment and Plan / UC Course  I have reviewed the triage vital signs and the nursing notes.  Pertinent labs & imaging results that were available during my care of the patient were reviewed by me and considered in my medical decision making (see chart for details).    Plan: 1.  The diabetes type 2 will be treated with the following: A.  Removal of the Lantus Solostar pen has been sent to the pharmacy to help control the diabetes. 2.  The acute lower back pain will be treated with the following: A.  Ibuprofen 600 mg every 8 hours with food to help control the pain and discomfort. 3.  The dyspepsia will be treated with the following: A.  Prilosec 40 mg once daily to help control the indigestion, heartburn and reflux. 4.  Advised to follow-up with internal medicine specialist.  Return to urgent care as needed. Final Clinical Impressions(s) / UC Diagnoses   Final diagnoses:  Type 2 diabetes mellitus with other specified complication, with long-term current use of insulin ([place])  Acute midline low back pain without sciatica  Dyspepsia     Discharge Instructions      Advised to restart your insulin to help gain control of diabetes.  Prescriptions for the insulin and the pen along with the glucometer strips to be sent to the pharmacy. Advised to take the Prilosec 40 mg daily to help control the  indigestion and reflux as this may be a cause of your chronic cough. Advised to take ibuprofen 600 mg every 8 hours with food to help control the lower back pain. Advised to follow-up with your internal medicine specialist over the next week or so to addressed your glucose levels. Advised return to urgent care as needed.  Glucose level today was 219.    ED Prescriptions     Medication Sig Dispense Auth. Provider   omeprazole (PRILOSEC) 40 MG capsule Take 1 capsule (40 mg total) by mouth daily. 30 capsule Nyoka Lint, PA-C   Insulin Pen Needle 32G X 4 MM MISC Use as directed 4 times daily. 200 each Nyoka Lint, PA-C   insulin glargine (LANTUS) 100 UNIT/ML Solostar Pen Inject 20 Units into the skin daily at 10pm. 6 mL Nyoka Lint, PA-C   hydrOXYzine (ATARAX) 25 MG tablet  (Status: Discontinued) Take 1 tablet (25 mg total) by mouth 3 (three) times daily as needed. 90 tablet Nyoka Lint, PA-C   glucose blood test strip Check your sugar in the morning before you eat breakfast, and one hour after a meal. 100 each Nyoka Lint, PA-C   ibuprofen (ADVIL) 600 MG tablet Take 1 tablet (600 mg total) by mouth 3 (three) times daily. 30 tablet Nyoka Lint, PA-C   glucose monitoring kit (FREESTYLE) monitoring kit  (Status: Discontinued) 1 each by Does not apply route daily. Check glucose once in the morning before breakfast and 1 hour after a meal 1 each Nyoka Lint, PA-C   insulin aspart (NOVOLOG) 100 UNIT/ML FlexPen Inject 10 Units into the skin 2 (two) times daily with a meal. 6 mL Nyoka Lint, PA-C   glucose monitoring kit (FREESTYLE) monitoring kit 1 each by Does not apply route daily. Check glucose once in the morning before breakfast and 1 hour after a meal 1 each Nyoka Lint, PA-C      PDMP not reviewed this encounter.   Nyoka Lint, PA-C 02/09/22 1234

## 2022-02-12 ENCOUNTER — Encounter: Payer: Medicaid Other | Admitting: Student

## 2022-02-21 ENCOUNTER — Ambulatory Visit (INDEPENDENT_AMBULATORY_CARE_PROVIDER_SITE_OTHER): Payer: Self-pay | Admitting: Student

## 2022-02-21 ENCOUNTER — Other Ambulatory Visit: Payer: Self-pay | Admitting: *Deleted

## 2022-02-21 ENCOUNTER — Encounter: Payer: Self-pay | Admitting: Student

## 2022-02-21 ENCOUNTER — Other Ambulatory Visit (HOSPITAL_COMMUNITY): Payer: Self-pay

## 2022-02-21 VITALS — BP 131/92 | HR 83 | Temp 98.2°F | Ht 63.5 in | Wt 218.8 lb

## 2022-02-21 DIAGNOSIS — E119 Type 2 diabetes mellitus without complications: Secondary | ICD-10-CM

## 2022-02-21 DIAGNOSIS — D509 Iron deficiency anemia, unspecified: Secondary | ICD-10-CM

## 2022-02-21 DIAGNOSIS — Z794 Long term (current) use of insulin: Secondary | ICD-10-CM

## 2022-02-21 DIAGNOSIS — Z7984 Long term (current) use of oral hypoglycemic drugs: Secondary | ICD-10-CM

## 2022-02-21 LAB — POCT GLYCOSYLATED HEMOGLOBIN (HGB A1C): Hemoglobin A1C: 9.9 % — AB (ref 4.0–5.6)

## 2022-02-21 LAB — GLUCOSE, CAPILLARY: Glucose-Capillary: 135 mg/dL — ABNORMAL HIGH (ref 70–99)

## 2022-02-21 MED ORDER — LIRAGLUTIDE 18 MG/3ML ~~LOC~~ SOPN
PEN_INJECTOR | SUBCUTANEOUS | 0 refills | Status: DC
  Filled 2022-02-21: qty 6, 30d supply, fill #0
  Filled 2022-03-31: qty 6, 33d supply, fill #0
  Filled 2022-06-12: qty 6, 30d supply, fill #0

## 2022-02-21 MED ORDER — LIRAGLUTIDE 18 MG/3ML ~~LOC~~ SOPN
PEN_INJECTOR | SUBCUTANEOUS | 0 refills | Status: DC
  Filled 2022-02-21: qty 18.7, 97d supply, fill #0

## 2022-02-21 NOTE — Patient Instructions (Signed)
Dana Hughes, it was a pleasure seeing you today!  Today we discussed: - Diabetes: I would like to add a medication called Victoza. This is a once daily injection. For the first week you will inject 0.'6mg'$  daily. After this, you will inject 1.'2mg'$  daily.  - I have put in an order to check your iron. I will call you with the results.   - For your knee and ankle pain, continue resting these at the end of the day, use ice/heat, and Tylenol as needed.  I have ordered the following labs today:   Lab Orders         Glucose, capillary         Iron, TIBC and Ferritin Panel         Microalbumin / Creatinine Urine Ratio         POC Hbg A1C       I have ordered the following medication/changed the following medications:   Stop the following medications: Medications Discontinued During This Encounter  Medication Reason   liraglutide (VICTOZA) 18 MG/3ML SOPN     Follow-up: 3 months   Please make sure to arrive 15 minutes prior to your next appointment. If you arrive late, you may be asked to reschedule.   We look forward to seeing you next time. Please call our clinic at [phone] if you have any questions or concerns. The best time to call is Monday-Friday from 9am-4pm, but there is someone available 24/7. If after hours or the weekend, call the main hospital number and ask for the Internal Medicine Resident On-Call. If you need medication refills, please notify your pharmacy one week in advance and they will send [place] a request.  Thank you for letting us take part in your care. Wishing you the best!  Thank you, Sanjuan Dame, MD

## 2022-02-23 NOTE — Progress Notes (Signed)
   CC: diabetes follow-up  HPI:  Dana Hughes is a 41 y.o. person with medical history as below presenting to [facility] for diabetes follow-up.   Please see problem-based list for further details, assessments, and plans.  Past Medical History:  Diagnosis Date   Anxiety    Arthritis    Asthma    Bronchitis    BV (bacterial vaginosis)    Candidiasis    Depression    Diabetes mellitus without complication ([place])    Endometriosis    Headache(784.0)    migraines   Hypertension    no longer takes meds   Migraines    Muscle spasm    Ovarian cyst    Pelvic pain in female 09/29/2015   Scoliosis    Uterine fibroid    Review of Systems:  As per HPI  Physical Exam:  Vitals:   02/21/22 1602  BP: (!) 131/92  Pulse: 83  Temp: 98.2 F (36.8 C)  TempSrc: Oral  SpO2: 100%  Weight: 218 lb 12.8 oz (99.2 kg)  Height: 5' 3.5" (1.613 m)   General: Resting comfortably in no acute distress CV: Regular rate, rhythm. No murmurs appreciated. Pulm: Normal work of breathing on room air. Clear to auscultation bilaterally. GI: Abdomen soft, non-tender, non-distended. Normoactive bowel sounds. MSK: Normal bulk, tone. No peripheral edema. Bilateral knees without crepitus, vagus/valgus testing normal.  Skin: Warm, dry. No rashes or lesions appreciated.  Assessment & Plan:   Diabetes mellitus, type 2 (HCC) A1c today 9.9% from 7.7% at last check. Reports compliance with her insulin. Does report she has not been eating as well recently. Denies any hypoglycemic episodes or any glucose readings <70. Denies any polyuria, polydipsia.   [name] and I had long discussion regarding diabetes and importance of compliance. Patient is open to another injection on top of her current insulin. Given we do not have her glucose readings I would prefer to not adjust insulin. I do think she would best benefit from a GLP-1 with the extra cardiovascular benefits.  - Start Victoza 0.'6mg'$  daily x7d followed by 1.'2mg'$   daily - Continue Lantus 20 units daily - Continue Novolog 10 units twice daily - Continue metformin '1000mg'$  twice daily - Urine microalbumin/creatinine today - Has eye appointment upcoming - Repeat A1c in three months  Iron deficiency anemia [name] reports she continues to have picca, mentioning that she often wants to chew on tissues. She is currently not taking iron supplements. Denies any black tarry stools, hematochezia. We will plan to re-check iron studies today. Possibly would benefit from iron transfusion, however insurance status is limiting factor.   - Iron studies today  Patient discussed with Dr. Lise Auer, MD Internal Medicine PGY-3 Pager: [phone]

## 2022-02-26 ENCOUNTER — Other Ambulatory Visit (HOSPITAL_COMMUNITY): Payer: Self-pay

## 2022-02-26 MED ORDER — METFORMIN HCL 1000 MG PO TABS
1000.0000 mg | ORAL_TABLET | Freq: Two times a day (BID) | ORAL | 3 refills | Status: DC
  Filled 2022-02-26: qty 60, 30d supply, fill #0
  Filled 2022-03-07 – 2022-03-31 (×2): qty 90, 45d supply, fill #0
  Filled 2022-06-12: qty 90, 45d supply, fill #1

## 2022-02-26 NOTE — Assessment & Plan Note (Addendum)
A1c today 9.9% from 7.7% at last check. Reports compliance with her insulin. Does report she has not been eating as well recently. Denies any hypoglycemic episodes or any glucose readings <70. Denies any polyuria, polydipsia.   Ms. Start and I had long discussion regarding diabetes and importance of compliance. Patient is open to another injection on top of her current insulin. Given we do not have her glucose readings I would prefer to not adjust insulin. I do think she would best benefit from a GLP-1 with the extra cardiovascular benefits.  - Start Victoza 0.'6mg'$  daily x7d followed by 1.'2mg'$  daily - Continue Lantus 20 units daily - Continue Novolog 10 units twice daily - Continue metformin '1000mg'$  twice daily - Urine microalbumin/creatinine today - Has eye appointment upcoming - Repeat A1c in three months

## 2022-02-26 NOTE — Assessment & Plan Note (Signed)
Ms. Mcnally reports she continues to have picca, mentioning that she often wants to chew on tissues. She is currently not taking iron supplements. Denies any black tarry stools, hematochezia. We will plan to re-check iron studies today. Possibly would benefit from iron transfusion, however insurance status is limiting factor.   - Iron studies today

## 2022-02-26 NOTE — Progress Notes (Signed)
Internal Medicine Clinic Attending ? ?Case discussed with Dr. Braswell  At the time of the visit.  We reviewed the resident?s history and exam and pertinent patient test results.  I agree with the assessment, diagnosis, and plan of care documented in the resident?s note.  ?

## 2022-02-28 ENCOUNTER — Encounter: Payer: Self-pay | Admitting: Obstetrics and Gynecology

## 2022-02-28 ENCOUNTER — Other Ambulatory Visit (HOSPITAL_COMMUNITY)
Admission: RE | Admit: 2022-02-28 | Discharge: 2022-02-28 | Disposition: A | Discharge status: Home / Self Care | Payer: Medicaid Other | Source: Ambulatory Visit | Attending: Obstetrics and Gynecology | Admitting: Obstetrics and Gynecology

## 2022-02-28 ENCOUNTER — Ambulatory Visit (INDEPENDENT_AMBULATORY_CARE_PROVIDER_SITE_OTHER): Payer: Self-pay | Admitting: Obstetrics and Gynecology

## 2022-02-28 VITALS — BP 119/88 | HR 83 | Ht 63.5 in | Wt 215.1 lb

## 2022-02-28 DIAGNOSIS — G8929 Other chronic pain: Secondary | ICD-10-CM

## 2022-02-28 DIAGNOSIS — N898 Other specified noninflammatory disorders of vagina: Secondary | ICD-10-CM

## 2022-02-28 DIAGNOSIS — R102 Pelvic and perineal pain: Secondary | ICD-10-CM

## 2022-02-28 DIAGNOSIS — N941 Unspecified dyspareunia: Secondary | ICD-10-CM

## 2022-02-28 NOTE — Progress Notes (Unsigned)
PHQ/GAD 7 positive.  Pt declines services as she is already seeing someone.    Frances Nickels  02/28/22

## 2022-02-28 NOTE — Patient Instructions (Addendum)
Pelvic physical therapy  for pain with intercourse   Keep taking your muscle relaxer  You may have yeast infections due to high blood sugars. We checked today for vaginal infections.   You may have gastritis causing the 'knot' in your stomach.

## 2022-02-28 NOTE — Progress Notes (Signed)
NEW GYNECOLOGY PATIENT Patient name: Dana Hughes MRN [id number]  Date of birth: [birth year] Chief Complaint:   Pelvic Pain     History:  Dana Hughes is a 41 y.o. [phone] being seen today for pelvic pain.    "Knot" in her stomach from lower back, upper abdomen to her 'uterus' that has been progressing over the last year. Saturday night, had a flare up of the pain - did not go to the ED (doesn't like hospitals) - baked chicken, collards, candied yams; earlier water and fruit; day prior fruit, koolaid, water, baked spaghetti and cheese on top. Not sure if there is any food that triggers the pain but hasn't noticed any particular pattern. Had pain when there was a day of no eating (avoided food due to being nauseated).   Menstrual cycles - may have cycles 4-5 days, maybe twice a month, may skip a meneses. No menses in November yet, twice in September, one in October. Blood clots have stopped.   Pain with cycles. Pain with intercourse (superficial and deep) - feels like he is hitting since last delivery 18 years ago. No pain with BM; burning sensation with voiding this AM.  Voids day 1-2x/hour  SUI present. She has occasional hesitancy with voiding. Occasional double void  - will void, have BM and notices some more urine loss with pushing. Reports urgency to void but not always a full bladder when she gets to the bathroom.   Filshie clips placed PP 18 years ago. Had previously been on depo provera but stopped because she was trying to conceive. She reports heavy bleeding on depo as well as weight gain. Was not aware that prior procedure would prevent spontaneous conception.     Review of Systems  All other systems reviewed and are negative.       Gynecologic History Patient's last menstrual period was 02/04/2022. Contraception: tubal ligation Last Pap: 04/2020. Result was normal with negative HPV Last Mammogram: 07/2021.  Result was normal Last Colonoscopy: 07/2020.  Result was  suboptimal  Obstetric History OB History  Gravida Para Term Preterm AB Living  _0 0 1 3  SAB IAB Ectopic Multiple Live Births  1 0 0 0 3    # Outcome Date GA Lbr Len/2nd Weight Sex Delivery Anes PTL Lv  4 Term 12/27/03    F Vag-Spont   LIV  3 Term 10/16/02    M Vag-Spont   LIV  2 Term 04/10/98    M Vag-Spont   LIV  1 SAB             Obstetric Comments  SVD x 3    Past Medical History:  Diagnosis Date  . Anxiety   . Arthritis   . Asthma   . Bronchitis   . BV (bacterial vaginosis)   . Candidiasis   . Depression   . Diabetes mellitus without complication ([place])   . Endometriosis   . Headache(784.0)    migraines  . Hypertension    no longer takes meds  . Migraines   . Muscle spasm   . Ovarian cyst   . Pelvic pain in female 09/29/2015  . Scoliosis   . Uterine fibroid     Past Surgical History:  Procedure Laterality Date  . BIOPSY  08/12/2020   Procedure: BIOPSY;  Surgeon: Mauri Pole, MD;  Location: [facility];  Service: Endoscopy;;  . CHOLECYSTECTOMY    . COLONOSCOPY WITH PROPOFOL N/A 08/12/2020   Procedure: COLONOSCOPY  WITH PROPOFOL;  Surgeon: Mauri Pole, MD;  Location[facility] END[facility]COPY;  Service: Endoscopy;  Laterality: N/A;  . ESOPHAGOGASTRODUODENOSCOPY (EGD) WITH PROPOFOL N/A 08/12/2020   Procedure: ESOPHAGOGASTRODUODENOSCOPY (EGD) WITH PROPOFOL;  Surgeon: Mauri Pole, MD;  Location: [place] ENDOSCOPY;  Service: Endoscopy;  Laterality: N/A;  . POLYPECTOMY  08/12/2020   Procedure: POLYPECTOMY;  Surgeon: Mauri Pole, MD;  Location: [place] ENDOSCOPY;  Service: Endoscopy;;  . TUBAL LIGATION      Current Outpatient Medications on File Prior to Visit  Medication Sig Dispense Refill  . acetaminophen (TYLENOL) 500 MG tablet Take 2 tablets (1,000 mg total) by mouth every 6 (six) hours as needed for mild pain (or Fever >/= 101). 30 tablet 0  . albuterol (PROVENTIL) (2.5 MG/3ML) 0.083% nebulizer solution Take 3 mLs (2.5 mg total) by nebulization every  6 (six) hours as needed for wheezing or shortness of breath. 75 mL 0  . albuterol (VENTOLIN HFA) 108 (90 Base) MCG/ACT inhaler Inhale 2 puffs into the lungs every 6 (six) hours as needed for wheezing or shortness of breath. Reported on 09/29/2015    . ARIPiprazole (ABILIFY) 20 MG tablet Take 1 tablet (20 mg total) by mouth daily. 30 tablet 3  . artificial tears (LACRILUBE) OINT ophthalmic ointment Place into both eyes at bedtime as needed for dry eyes. 3.5 g 0  . diclofenac Sodium (VOLTAREN) 1 % GEL Apply 4 g topically 4 (four) times daily. 100 g 0  . dicyclomine (BENTYL) 10 MG capsule Take 1 capsule (10 mg total) by mouth 4 (four) times daily as needed for spasms (abdominal pain). 30 capsule 1  . ferrous sulfate 325 (65 FE) MG tablet Take 1 tablet (325 mg total) by mouth every other day. 30 tablet 3  . fluticasone (FLONASE) 50 MCG/ACT nasal spray PLACE 1 SPRAY INTO BOTH NOSTRILS IN THE MORNING AND AT BEDTIME. 16 g 1  . gabapentin (NEURONTIN) 300 MG capsule Take 1 capsule (300 mg total) by mouth 3 (three) times daily. 90 capsule 3  . glucose blood test strip Check your sugar in the morning before you eat breakfast, and one hour after a meal. 100 each 2  . Blood Glucose Monitoring Suppl (TRUE METRIX METER) w/Device KIT use as directed 1 kit 0  . guaifenesin (ROBITUSSIN) 100 MG/5ML syrup Take 200 mg by mouth 3 (three) times daily as needed for cough.    Marland Kitchen ibuprofen (ADVIL) 600 MG tablet Take 1 tablet (600 mg total) by mouth 3 (three) times daily. 30 tablet 0  . insulin aspart (NOVOLOG) 100 UNIT/ML FlexPen Inject 10 Units into the skin 2 (two) times daily with a meal. 6 mL 0  . insulin glargine (LANTUS) 100 UNIT/ML Solostar Pen Inject 20 Units into the skin daily at 10pm. 6 mL 0  . Insulin Pen Needle 32G X 4 MM MISC Use as directed 4 times daily. 200 each 0  . lamoTRIgine (LAMICTAL) 150 MG tablet Take 1 tablet (150 mg total) by mouth daily. 30 tablet 3  . liraglutide (VICTOZA) 18 MG/3ML SOPN Inject 0.6  mg into the skin daily for 7 days, THEN 1.2 mg daily. 6 mL 0  . meclizine (ANTIVERT) 25 MG tablet Take 1 tablet (25 mg total) by mouth 3 (three) times daily as needed for dizziness. 30 tablet 0  . metFORMIN (GLUCOPHAGE) 1000 MG tablet Take 1 tablet (1,000 mg total) by mouth 2 (two) times daily with a meal. 90 tablet 3  . metoprolol tartrate (LOPRESSOR) 100 MG tablet TAKE  1 TABLET BY MOUTH 2 HOURS BEFORE MRI. 1 tablet 0  . mirtazapine (REMERON) 45 MG tablet Take 1 tablet (45 mg total) by mouth at bedtime. 30 tablet 3  . omeprazole (PRILOSEC) 40 MG capsule Take 1 capsule (40 mg total) by mouth daily. 30 capsule 1  . oxyCODONE (OXY IR/ROXICODONE) 5 MG immediate release tablet Take 1 tablet (5 mg total) by mouth 2 (two) times daily as needed for severe pain. 10 tablet 0  . senna-docusate (SENOKOT S) 8.6-50 MG tablet Take 1 tablet by mouth daily. 90 tablet 2  . SUMAtriptan (IMITREX) 50 MG tablet Take 1 tablet (50 mg total) by mouth every 2 (two) hours as needed for migraine. May repeat in 2 hours if headache persists or recurs. Please take no more than 3 tablets (150 mg) in 24 hours. 15 tablet 0  . TRUEplus Lancets 28G MISC USE AS DIRECTED 100 each 0   No current facility-administered medications on file prior to visit.    Allergies  Allergen Reactions  . Mushroom Extract Complex Anaphylaxis  . Latex Itching and Swelling  . Penicillins Itching    Has patient had a PCN reaction causing immediate rash, facial/tongue/throat swelling, SOB or lightheadedness with hypotension: No Has patient had a PCN reaction causing severe rash involving mucus membranes or skin necrosis: No Has patient had a PCN reaction that required hospitalization No Has patient had a PCN reaction occurring within the last 10 years: No If all of the above answers are "NO", then may proceed with Cephalosporin use.     Social History:  reports that she has quit smoking. Her smoking use included cigars. She has never used smokeless  tobacco. She reports that she does not currently use alcohol. She reports current drug use. Frequency: 7.00 times per week. Drug: Marijuana.  Family History  Problem Relation Age of Onset  . Migraines Mother   . Cancer Father   . Colon cancer Father   . Lupus Paternal Grandmother   . Cancer Paternal Grandmother   . Breast cancer Paternal Grandmother   . Migraines Maternal Aunt   . Hypertension Maternal Aunt   . Breast cancer Maternal Aunt   . Colon cancer Paternal Aunt   . Breast cancer Paternal Aunt   . Ovarian cancer Cousin     The following portions of the patient's history were reviewed and updated as appropriate: allergies, current medications, past family history, past medical history, past social history, past surgical history and problem list.  Review of Systems Pertinent items noted in HPI and remainder of comprehensive ROS otherwise negative.  Physical Exam:  BP 119/88   Pulse 83   Ht 5' 3.5" (1.613 m)   Wt 215 lb 1.6 oz (97.6 kg)   LMP 02/04/2022   BMI 37.51 kg/m  Physical Exam Vitals and nursing note reviewed. Exam conducted with a chaperone present.  Constitutional:      Appearance: Normal appearance.  Cardiovascular:     Rate and Rhythm: Normal rate.  Pulmonary:     Effort: Pulmonary effort is normal.     Breath sounds: Normal breath sounds.  Abdominal:     General: Bowel sounds are normal.     Palpations: Abdomen is soft.     Tenderness: There is abdominal tenderness.     Comments: LUQ tenderness, no lower abdominal tenderness   Genitourinary:    General: Normal vulva.     Exam position: Lithotomy position.     Comments: Normal appearing vulva Normal vulvar sensation  bilaterally Nontender superficial pelvic floor muscles Nontender ischial tuberosities bilaterally  Allodynia at introitus: No  Anal wink not present Kegel 4/5 Posterior vaginal wall tender Right levator ani 6/10 Right ischiococcygeous 6/10 Right obturator internus 5/10 Left  levator ani 5/10 Left ischioccocygeous 5/10 Left obturator internus 3/10 Anterior vaginal wall tender, 3/10 Uterine tenderness limited due to habitus, but palpated cervix not tender   Neurological:     General: No focal deficit present.     Mental Status: She is alert and oriented to person, place, and time.  Psychiatric:        Mood and Affect: Mood normal.        Behavior: Behavior normal.        Thought Content: Thought content normal.        Judgment: Judgment normal.      Assessment and Plan:  1. Chronic pelvic pain in female *** - Ambulatory referral to Physical Therapy  2. Female dyspareunia *** - Ambulatory referral to Physical Therapy  3. Vaginal discharge *** - Cervicovaginal ancillary only( )    Routine preventative health maintenance measures emphasized. Please refer to After Visit Summary for other counseling recommendations.      Darliss Cheney, MD Obstetrician & Gynecologist, [facility] [employer], [place]

## 2022-03-01 LAB — CERVICOVAGINAL ANCILLARY ONLY
Bacterial Vaginitis (gardnerella): POSITIVE — AB
Candida Glabrata: NEGATIVE
Candida Vaginitis: POSITIVE — AB
Chlamydia: NEGATIVE
Comment: NEGATIVE
Comment: NEGATIVE
Comment: NEGATIVE
Comment: NEGATIVE
Comment: NEGATIVE
Comment: NORMAL
Neisseria Gonorrhea: NEGATIVE
Trichomonas: NEGATIVE

## 2022-03-02 ENCOUNTER — Other Ambulatory Visit: Payer: Self-pay | Admitting: Obstetrics and Gynecology

## 2022-03-02 ENCOUNTER — Other Ambulatory Visit (HOSPITAL_COMMUNITY): Payer: Self-pay

## 2022-03-02 DIAGNOSIS — N760 Acute vaginitis: Secondary | ICD-10-CM

## 2022-03-02 MED ORDER — FLUCONAZOLE 150 MG PO TABS
150.0000 mg | ORAL_TABLET | Freq: Once | ORAL | 1 refills | Status: AC
  Filled 2022-03-02 – 2022-05-21 (×3): qty 1, 1d supply, fill #0

## 2022-03-02 MED ORDER — METRONIDAZOLE 0.75 % VA GEL
1.0000 | Freq: Every day | VAGINAL | 1 refills | Status: DC
  Filled 2022-03-02 – 2022-03-07 (×2): qty 70, 5d supply, fill #0
  Filled 2022-03-20: qty 70, 7d supply, fill #0
  Filled 2022-03-31: qty 70, 5d supply, fill #1

## 2022-03-06 ENCOUNTER — Other Ambulatory Visit (HOSPITAL_COMMUNITY): Payer: Self-pay

## 2022-03-07 ENCOUNTER — Other Ambulatory Visit: Payer: Self-pay

## 2022-03-07 ENCOUNTER — Ambulatory Visit: Payer: Medicaid Other | Admitting: Physical Therapy

## 2022-03-08 ENCOUNTER — Other Ambulatory Visit (HOSPITAL_COMMUNITY): Payer: Self-pay

## 2022-03-14 ENCOUNTER — Other Ambulatory Visit: Payer: Self-pay

## 2022-03-20 ENCOUNTER — Other Ambulatory Visit: Payer: Self-pay

## 2022-03-27 ENCOUNTER — Ambulatory Visit: Payer: Medicaid Other | Admitting: Gastroenterology

## 2022-03-31 ENCOUNTER — Other Ambulatory Visit: Payer: Self-pay

## 2022-04-02 ENCOUNTER — Telehealth: Payer: Self-pay

## 2022-04-02 ENCOUNTER — Other Ambulatory Visit: Payer: Self-pay

## 2022-04-02 NOTE — Telephone Encounter (Signed)
Return pt's call. Stated he symptoms are irritation, burning w/urination, and itching. Stated she can only use Dove soap. Stated she has tried OTC medication. Also she was prescribed Metronidazole gel last month by [facility]. Stated this has been going on x 5 weeks- now vaginal area is swollen and painful. Appt scheduled w/Dr Gomez-Caraballo tomorrow 12/12 @ 1515 PM.

## 2022-04-02 NOTE — Telephone Encounter (Signed)
Requesting to speak with a a nurse about yeast infection for 5 weeks. Would like an appt by this week. No open slot. Please call pt back.

## 2022-04-03 ENCOUNTER — Other Ambulatory Visit: Payer: Self-pay

## 2022-04-03 ENCOUNTER — Encounter: Payer: Self-pay | Admitting: Student

## 2022-04-03 ENCOUNTER — Ambulatory Visit (INDEPENDENT_AMBULATORY_CARE_PROVIDER_SITE_OTHER): Payer: Medicaid Other | Admitting: Student

## 2022-04-03 ENCOUNTER — Other Ambulatory Visit (HOSPITAL_COMMUNITY)
Admission: RE | Admit: 2022-04-03 | Discharge: 2022-04-03 | Disposition: A | Discharge status: Home / Self Care | Payer: Medicaid Other | Source: Ambulatory Visit | Attending: Internal Medicine | Admitting: Internal Medicine

## 2022-04-03 ENCOUNTER — Other Ambulatory Visit (HOSPITAL_COMMUNITY): Payer: Self-pay

## 2022-04-03 VITALS — BP 157/99 | HR 82 | Temp 98.0°F | Wt 216.8 lb

## 2022-04-03 DIAGNOSIS — I10 Essential (primary) hypertension: Secondary | ICD-10-CM | POA: Diagnosis not present

## 2022-04-03 DIAGNOSIS — E119 Type 2 diabetes mellitus without complications: Secondary | ICD-10-CM

## 2022-04-03 DIAGNOSIS — Z87891 Personal history of nicotine dependence: Secondary | ICD-10-CM

## 2022-04-03 DIAGNOSIS — Z794 Long term (current) use of insulin: Secondary | ICD-10-CM | POA: Diagnosis not present

## 2022-04-03 DIAGNOSIS — Z7984 Long term (current) use of oral hypoglycemic drugs: Secondary | ICD-10-CM | POA: Diagnosis not present

## 2022-04-03 DIAGNOSIS — N898 Other specified noninflammatory disorders of vagina: Secondary | ICD-10-CM | POA: Insufficient documentation

## 2022-04-03 LAB — GLUCOSE, CAPILLARY: Glucose-Capillary: 311 mg/dL — ABNORMAL HIGH (ref 70–99)

## 2022-04-03 MED ORDER — FLUCONAZOLE 150 MG PO TABS
150.0000 mg | ORAL_TABLET | Freq: Every day | ORAL | 0 refills | Status: AC
  Filled 2022-04-03 – 2022-04-04 (×2): qty 2, 2d supply, fill #0

## 2022-04-03 MED ORDER — AMLODIPINE BESYLATE 5 MG PO TABS
5.0000 mg | ORAL_TABLET | Freq: Every day | ORAL | 11 refills | Status: DC
  Filled 2022-04-03 – 2022-04-04 (×2): qty 30, 30d supply, fill #0
  Filled 2022-06-12: qty 30, 30d supply, fill #1
  Filled 2022-08-08: qty 30, 30d supply, fill #2

## 2022-04-03 NOTE — Assessment & Plan Note (Signed)
blood glucose is 311  After further questioning, patient is not been following her DM treatment as prescribed.  Patient has been intermittently dosing her daily Lantus and BID novolog. Similarly, she has not been taking her BID metformin daily. Patient appears confused about the timing of her medications and diabetes management in general.  Discussed the need of glucose monitoring at home and dietary changes. Printed a list of her current medications and instructions so she can reference and set alarms. Though she does not report catabolic symptoms of diabetes today, however, her hyperglycemia is likely contributing to her recurrent yeast infections.  -Restart previous management. Patient instructed to increase victoza to 1.2 mg -Referral to diabetes and nutrition education

## 2022-04-03 NOTE — Patient Instructions (Addendum)
Thank you, Ms.[name] for allowing [place] to provide your care today. Today we discussed  Vaginal itching We are prescribing you with a medication called Fluconazole Please take the first 150 mg today Take the second 150 mg tablet in 3 days I am also getting a test of your vaginal fluid and your urine to check for other infections. Depending on that result, I will go ahead and add or change your medication management  Diabetes Please check your your blood sugars at home. Start taking Victoza 1.2 mg daily. Please continue the other diabetes medications as you had been prescribed. See the medications sheet for reference. We are also referring you to our diabetes educator. Please call our office and schedule an appointment with Butch Penny  Hypertension Please monitor your blood pressure at home Please start taking amlodipine 5 mg again. We would like to see you in 4 weeks to recheck.   I have ordered the following labs for you:   Lab Orders         Culture, Urine         Glucose, capillary         HIV antibody (with reflex)         Urinalysis, Reflex Microscopic      I will call if any are abnormal. All of your labs can be accessed through "My Chart".   My Chart Access: [email or web address]  Please follow-up in in 4 weeks.  Please make sure to arrive 15 minutes prior to your next appointment. If you arrive late, you may be asked to reschedule.    We look forward to seeing you next time. Please call our clinic at [phone] if you have any questions or concerns. The best time to call is Monday-Friday from 9am-4pm, but there is someone available 24/7. If after hours or the weekend, call the main hospital number and ask for the Internal Medicine Resident On-Call. If you need medication refills, please notify your pharmacy one week in advance and they will send [place] a request.   Thank you for letting us take part in your care. Wishing you the  best!  Romana Juniper, MD 04/03/2022, 4:25 PM [facility] Internal Medicine Resident, PGY-1

## 2022-04-03 NOTE — Progress Notes (Signed)
Subjective:  CC: vaginal discharge  HPI:  [name] is a 41 y.o. female with a past medical history stated below and presents today for vaginal discharge. Please see problem based assessment and plan for additional details.  Past Medical History:  Diagnosis Date   Anxiety    Arthritis    Asthma    Bronchitis    BV (bacterial vaginosis)    Candidiasis    Depression    Diabetes mellitus without complication ([place])    Endometriosis    Headache(784.0)    migraines   Hypertension    no longer takes meds   Migraines    Muscle spasm    Ovarian cyst    Pelvic pain in female 09/29/2015   Scoliosis    Uterine fibroid     Current Outpatient Medications on File Prior to Visit  Medication Sig Dispense Refill   acetaminophen (TYLENOL) 500 MG tablet Take 2 tablets (1,000 mg total) by mouth every 6 (six) hours as needed for mild pain (or Fever >/= 101). 30 tablet 0   albuterol (PROVENTIL) (2.5 MG/3ML) 0.083% nebulizer solution Take 3 mLs (2.5 mg total) by nebulization every 6 (six) hours as needed for wheezing or shortness of breath. 75 mL 0   albuterol (VENTOLIN HFA) 108 (90 Base) MCG/ACT inhaler Inhale 2 puffs into the lungs every 6 (six) hours as needed for wheezing or shortness of breath. Reported on 09/29/2015     ARIPiprazole (ABILIFY) 20 MG tablet Take 1 tablet (20 mg total) by mouth daily. 30 tablet 3   artificial tears (LACRILUBE) OINT ophthalmic ointment Place into both eyes at bedtime as needed for dry eyes. 3.5 g 0   Blood Glucose Monitoring Suppl (TRUE METRIX METER) w/Device KIT use as directed 1 kit 0   diclofenac Sodium (VOLTAREN) 1 % GEL Apply 4 g topically 4 (four) times daily. 100 g 0   dicyclomine (BENTYL) 10 MG capsule Take 1 capsule (10 mg total) by mouth 4 (four) times daily as needed for spasms (abdominal pain). 30 capsule 1   fluticasone (FLONASE) 50 MCG/ACT nasal spray PLACE 1 SPRAY INTO BOTH NOSTRILS IN THE MORNING AND AT BEDTIME. (Patient taking  differently: 2 sprays.) 16 g 1   gabapentin (NEURONTIN) 300 MG capsule Take 1 capsule (300 mg total) by mouth 3 (three) times daily. 90 capsule 3   glucose blood test strip Check your sugar in the morning before you eat breakfast, and one hour after a meal. 100 each 2   ibuprofen (ADVIL) 600 MG tablet Take 1 tablet (600 mg total) by mouth 3 (three) times daily. 30 tablet 0   insulin aspart (NOVOLOG) 100 UNIT/ML FlexPen Inject 10 Units into the skin 2 (two) times daily with a meal. 6 mL 0   insulin glargine (LANTUS) 100 UNIT/ML Solostar Pen Inject 20 Units into the skin daily at 10pm. 6 mL 0   Insulin Pen Needle 32G X 4 MM MISC Use as directed 4 times daily. 200 each 0   lamoTRIgine (LAMICTAL) 150 MG tablet Take 1 tablet (150 mg total) by mouth daily. 30 tablet 3   liraglutide (VICTOZA) 18 MG/3ML SOPN Inject 0.6 mg into the skin daily for 7 days, THEN 1.2 mg daily. 6 mL 0   meclizine (ANTIVERT) 25 MG tablet Take 1 tablet (25 mg total) by mouth 3 (three) times daily as needed for dizziness. 30 tablet 0   metFORMIN (GLUCOPHAGE) 1000 MG tablet Take 1 tablet (1,000 mg total) by mouth  2 (two) times daily with a meal. 90 tablet 3   metoprolol tartrate (LOPRESSOR) 100 MG tablet TAKE 1 TABLET BY MOUTH 2 HOURS BEFORE MRI. 1 tablet 0   metroNIDAZOLE (METROGEL) 0.75 % vaginal gel Place 1 Applicatorful vaginally at bedtime for 5 days. 70 g 1   mirtazapine (REMERON) 45 MG tablet Take 1 tablet (45 mg total) by mouth at bedtime. 30 tablet 3   omeprazole (PRILOSEC) 40 MG capsule Take 1 capsule (40 mg total) by mouth daily. 30 capsule 1   oxyCODONE (OXY IR/ROXICODONE) 5 MG immediate release tablet Take 1 tablet (5 mg total) by mouth 2 (two) times daily as needed for severe pain. 10 tablet 0   senna-docusate (SENOKOT S) 8.6-50 MG tablet Take 1 tablet by mouth daily. 90 tablet 2   SUMAtriptan (IMITREX) 50 MG tablet Take 1 tablet (50 mg total) by mouth every 2 (two) hours as needed for migraine. May repeat in 2 hours if  headache persists or recurs. Please take no more than 3 tablets (150 mg) in 24 hours. 15 tablet 0   TRUEplus Lancets 28G MISC USE AS DIRECTED 100 each 0   No current facility-administered medications on file prior to visit.    Family History  Problem Relation Age of Onset   Migraines Mother    Cancer Father    Colon cancer Father    Lupus Paternal Grandmother    Cancer Paternal Grandmother    Breast cancer Paternal Grandmother    Migraines Maternal Aunt    Hypertension Maternal Aunt    Breast cancer Maternal Aunt    Colon cancer Paternal Aunt    Breast cancer Paternal Aunt    Ovarian cancer Cousin     Social History   Socioeconomic History   Marital status: Single    Spouse name: Not on file   Number of children: Not on file   Years of education: Not on file   Highest education level: Not on file  Occupational History   Not on file  Tobacco Use   Smoking status: Former    Types: Cigars   Smokeless tobacco: Never   Tobacco comments:    1-2 cigars/week  Vaping Use   Vaping Use: Never used  Substance and Sexual Activity   Alcohol use: Not Currently    Comment: occasional   Drug use: Yes    Frequency: 7.0 times per week    Types: Marijuana    Comment: 1 to 2 blunts a week, last use last night   Sexual activity: Yes    Birth control/protection: Condom, Surgical  Other Topics Concern   Not on file  Social History Narrative   Not on file   Social Determinants of Health   Financial Resource Strain: Medium Risk (11/08/2021)   Overall Financial Resource Strain (CARDIA)    Difficulty of Paying Living Expenses: Somewhat hard  Food Insecurity: Food Insecurity Present (01/10/2022)   Hunger Vital Sign    Worried About Running Out of Food in the Last Year: Sometimes true    Ran Out of Food in the Last Year: Sometimes true  Transportation Needs: Unmet Transportation Needs (01/10/2022)   PRAPARE - Hydrologist (Medical): Yes    Lack of  Transportation (Non-Medical): No  Physical Activity: Not on file  Stress: Not on file  Social Connections: Not on file  Intimate Partner Violence: Not on file    Review of Systems: ROS negative except for what is noted on the  assessment and plan.  Objective:   Vitals:   04/03/22 1456 04/03/22 1633  BP: (!) 150/105 (!) 157/99  Pulse: 84 82  Temp: 98 F (36.7 C)   TempSrc: Oral   SpO2: 99%   Weight: 216 lb 12.8 oz (98.3 kg)     Physical Exam: Constitutional: well-appearing woman sitting in exam bed, in no acute distress HENT: normocephalic atraumatic, mucous membranes moist Eyes: conjunctiva non-erythematous Neck: supple Cardiovascular: regular rate and rhythm, no m/r/g Pulmonary/Chest: normal work of breathing on room air, lungs clear to auscultation bilaterally Abdominal: soft, non-tender, non-distended MSK: normal bulk and tone Neurological: alert & oriented x 3, 5/5 strength in bilateral upper and lower extremities, normal gait Skin: warm and dry GU: see vaginal discharge A/P Psych: appropriate mood and affect     Assessment & Plan:   Vaginal discharge Patient presenting with worsening thick, white vaginal discharge, associated with intense pruritus and burning on her vulva. Patient denies burning with urination, fever, chills, nausea, vomiting. No abdominal pain. No new sexual partners, use of lubricants, or vaginal douches.   Patient reports she had a similar episode in November and was prescribed a 7 day course of a gel medication. She did not take a one time dose of medication as she was prescribed.   On exam: No abdominal or subrapubic tenderness. No LN edema noted on exam.  GU exam: No erythema, edema noted on visual inspection of the vulva, mons pubis.  No visible excoriations present on labia majora or minora. Thick, white discharge in introitus.   Given physical exam findings and recent cervicovaginal test positive for candida, patient's presentation is  consistent with candida vulvovaginitis. Of note, patient's BG has been uncontrolled, which puts her a higher risk for repated yeast infections. Based on this, will treat with oral fluconazole while also testing cervicovaginal swab for BV, G/C, and trichomonas. Will also send for a U/A since patient could not distinguish is the burning sensation she experienced with urination was 2/2 urine on excoriations vs urethral pain. -cervicovaginal self swab test today\ -Fluconazole 150 mg  x2, three days apart -follow up U/a and culture  Diabetes mellitus, type 2 ([facility]) blood glucose is 311  After further questioning, patient is not been following her DM treatment as prescribed.  Patient has been intermittently dosing her daily Lantus and BID novolog. Similarly, she has not been taking her BID metformin daily. Patient appears confused about the timing of her medications and diabetes management in general.  Discussed the need of glucose monitoring at home and dietary changes. Printed a list of her current medications and instructions so she can reference and set alarms. Though she does not report catabolic symptoms of diabetes today, however, her hyperglycemia is likely contributing to her recurrent yeast infections.  -Restart previous management. Patient instructed to increase victoza to 1.2 mg -Referral to diabetes and nutrition education   Hypertension Patient has been on therapy until 2022. Patient has not been in therapy since. She continues to smoke. Today she is asymptomatic, however, her BP is 157/99.  Patient was counseled on tobacco cessation; she would like to continue working on her own for now. Will plan to restart her on amlodipine 5 mg. Will re-evaluate her HTN management at that time -Restart amlodipine 5 mg -Follow up in 4 weeks   Patient seen with Dr. Cain Sieve

## 2022-04-03 NOTE — Assessment & Plan Note (Signed)
Patient has been on therapy until 2022. Patient has not been in therapy since. She continues to smoke. Today she is asymptomatic, however, her BP is 157/99.  Patient was counseled on tobacco cessation; she would like to continue working on her own for now. Will plan to restart her on amlodipine 5 mg. Will re-evaluate her HTN management at that time -Restart amlodipine 5 mg -Follow up in 4 weeks

## 2022-04-03 NOTE — Assessment & Plan Note (Signed)
Patient presenting with worsening thick, white vaginal discharge, associated with intense pruritus and burning on her vulva. Patient denies burning with urination, fever, chills, nausea, vomiting. No abdominal pain. No new sexual partners, use of lubricants, or vaginal douches.   Patient reports she had a similar episode in November and was prescribed a 7 day course of a gel medication. She did not take a one time dose of medication as she was prescribed.   On exam: No abdominal or subrapubic tenderness. No LN edema noted on exam.  GU exam: No erythema, edema noted on visual inspection of the vulva, mons pubis.  No visible excoriations present on labia majora or minora. Thick, white discharge in introitus.   Given physical exam findings and recent cervicovaginal test positive for candida, patient's presentation is consistent with candida vulvovaginitis. Of note, patient's BG has been uncontrolled, which puts her a higher risk for repated yeast infections. Based on this, will treat with oral fluconazole while also testing cervicovaginal swab for BV, G/C, and trichomonas. Will also send for a U/A since patient could not distinguish is the burning sensation she experienced with urination was 2/2 urine on excoriations vs urethral pain. -cervicovaginal self swab test today\ -Fluconazole 150 mg  x2, three days apart -follow up U/a and culture

## 2022-04-04 ENCOUNTER — Other Ambulatory Visit (HOSPITAL_COMMUNITY): Payer: Self-pay

## 2022-04-04 ENCOUNTER — Other Ambulatory Visit: Payer: Self-pay

## 2022-04-04 ENCOUNTER — Ambulatory Visit: Payer: Medicaid Other | Admitting: Physical Therapy

## 2022-04-04 LAB — URINALYSIS, ROUTINE W REFLEX MICROSCOPIC
Bilirubin, UA: NEGATIVE
Ketones, UA: NEGATIVE
Leukocytes,UA: NEGATIVE
Nitrite, UA: NEGATIVE
Protein,UA: NEGATIVE
RBC, UA: NEGATIVE
Specific Gravity, UA: >=1.03 — AB (ref 1.005–1.030)
Urobilinogen, Ur: 0.2 mg/dL (ref 0.2–1.0)
pH, UA: 6.5 (ref 5.0–7.5)

## 2022-04-04 LAB — CERVICOVAGINAL ANCILLARY ONLY
Bacterial Vaginitis (gardnerella): NEGATIVE
Candida Glabrata: NEGATIVE
Candida Vaginitis: POSITIVE — AB
Chlamydia: NEGATIVE
Comment: NEGATIVE
Comment: NEGATIVE
Comment: NEGATIVE
Comment: NEGATIVE
Comment: NEGATIVE
Comment: NORMAL
Neisseria Gonorrhea: NEGATIVE
Trichomonas: NEGATIVE

## 2022-04-04 LAB — HIV ANTIBODY (ROUTINE TESTING W REFLEX): HIV Screen 4th Generation wRfx: NONREACTIVE

## 2022-04-04 NOTE — Progress Notes (Signed)
Internal Medicine Clinic Attending  I saw and evaluated the patient.  I personally confirmed the key portions of the history and exam documented by Dr. Altamease Oiler and I reviewed pertinent patient test results.  The assessment, diagnosis, and plan were formulated together and I agree with the documentation in the resident's note.

## 2022-04-05 ENCOUNTER — Other Ambulatory Visit: Payer: Self-pay

## 2022-04-05 ENCOUNTER — Encounter (HOSPITAL_COMMUNITY): Payer: Self-pay | Admitting: Psychiatry

## 2022-04-05 ENCOUNTER — Ambulatory Visit: Payer: Self-pay | Admitting: Licensed Clinical Social Worker

## 2022-04-05 ENCOUNTER — Telehealth (INDEPENDENT_AMBULATORY_CARE_PROVIDER_SITE_OTHER): Payer: No Payment, Other | Admitting: Psychiatry

## 2022-04-05 DIAGNOSIS — F411 Generalized anxiety disorder: Secondary | ICD-10-CM | POA: Diagnosis not present

## 2022-04-05 DIAGNOSIS — F3181 Bipolar II disorder: Secondary | ICD-10-CM | POA: Diagnosis not present

## 2022-04-05 MED ORDER — LAMOTRIGINE 150 MG PO TABS
150.0000 mg | ORAL_TABLET | Freq: Every day | ORAL | 3 refills | Status: DC
  Filled 2022-04-05 – 2022-06-12 (×2): qty 30, 30d supply, fill #0

## 2022-04-05 MED ORDER — GABAPENTIN 300 MG PO CAPS
300.0000 mg | ORAL_CAPSULE | Freq: Three times a day (TID) | ORAL | 3 refills | Status: DC
  Filled 2022-04-05 – 2022-06-12 (×2): qty 90, 30d supply, fill #0

## 2022-04-05 MED ORDER — MIRTAZAPINE 45 MG PO TABS
45.0000 mg | ORAL_TABLET | Freq: Every day | ORAL | 3 refills | Status: DC
  Filled 2022-04-05 – 2022-06-12 (×2): qty 30, 30d supply, fill #0

## 2022-04-05 MED ORDER — ARIPIPRAZOLE 20 MG PO TABS
20.0000 mg | ORAL_TABLET | Freq: Every day | ORAL | 3 refills | Status: DC
  Filled 2022-04-05 – 2022-06-12 (×2): qty 30, 30d supply, fill #0

## 2022-04-05 NOTE — Patient Outreach (Signed)
SW removed self from care team.   Aylen Stradford, BSW, MSW, LCSW-A  Social Worker [facility] Care Management  [phone] 

## 2022-04-05 NOTE — Progress Notes (Signed)
BH MD/PA/NP OP Progress Note Virtual Visit via Video Note  I connected with Dana Hughes on 04/05/22 at  2:30 PM EST by a video enabled telemedicine application and verified that I am speaking with the correct person using two identifiers.  Location: Patient: Home Provider: Clinic   I discussed the limitations of evaluation and management by telemedicine and the availability of in person appointments. The patient expressed understanding and agreed to proceed.  I provided 30 minutes of non-face-to-face time during this encounter.      04/05/2022 2:37 PM Dana Hughes  MRN:  [id number]  Chief Complaint:  "My mood swings are worse"   HPI: 41 year old female seen today for follow up psychiatric evaluation. She has a psychiatric history of depression, anxiety, and adjustment disorder.  She is currently managed on Mirtazapine 45 mg nightly, Abilify 20 mg daily, hydroxyzine 25 mg three times daily as needed, Lamictal 150 mg daily.   She inform her that her  medications are somewhat effective in managing her psychiatric conditions.    Today she was was pleasant, well groomed, cooperative, engaged in conversation, and maintained eye contact. She informed Probation officer that her mood swings are worse.  She notes that recently she has been irritable with her children's father and her family members.  She also notes that she continues to experience symptoms of hypomania such as patient in mood, distractibility, irritability, and racing thoughts.  Patient notes however that she is learning how to cope with it. She request that medications not be adjusted.   Patient also reports that she continues to be anxious. Today provider conducted a GAD-7 and patient scored a 19, at her last visit she scored an 20.  Provider also conducted PHQ-9 and patient scored a 17, at her last visit she scored an 20.  Today she denies SI/HI.  She notes that she no longer has visual but notes that she continues to have AH of  people calling her name.   Patient reports that she has continuous back pain due to scoliosis. She notes that she is seeking disability for this and her mental health conditions. Patient reports her disability has  been denied twice. Due to chronic pack pain patient reports that she has been out of work.   At this time patient notes that she can cope with the above stressors. No medication changes made today. Patient agreeable to continue medications as prescribed. He will follow up with outpatient counseling for therapy. No other concerns noted at this time.     Visit Diagnosis:    ICD-10-CM   1. Bipolar 2 disorder, major depressive episode (HCC)  F31.81 ARIPiprazole (ABILIFY) 20 MG tablet    gabapentin (NEURONTIN) 300 MG capsule    lamoTRIgine (LAMICTAL) 150 MG tablet    mirtazapine (REMERON) 45 MG tablet    2. Generalized anxiety disorder  F41.1 gabapentin (NEURONTIN) 300 MG capsule    mirtazapine (REMERON) 45 MG tablet      Past Psychiatric History: depression, anxiety, and adjustment disorder.  Past Medical History:  Past Medical History:  Diagnosis Date   Anxiety    Arthritis    Asthma    Bronchitis    BV (bacterial vaginosis)    Candidiasis    Depression    Diabetes mellitus without complication ([place])    Endometriosis    Headache(784.0)    migraines   Hypertension    no longer takes meds   Migraines    Muscle spasm    Ovarian cyst  Pelvic pain in female 09/29/2015   Scoliosis    Uterine fibroid     Past Surgical History:  Procedure Laterality Date   BIOPSY  08/12/2020   Procedure: BIOPSY;  Surgeon: Mauri Pole, MD;  Location: [facility];  Service: Endoscopy;;   CHOLECYSTECTOMY     COLONOSCOPY WITH PROPOFOL N/A 08/12/2020   Procedure: COLONOSCOPY WITH PROPOFOL;  Surgeon: Mauri Pole, MD;  Location: [facility];  Service: Endoscopy;  Laterality: N/A;   ESOPHAGOGASTRODUODENOSCOPY (EGD) WITH PROPOFOL N/A 08/12/2020   Procedure:  ESOPHAGOGASTRODUODENOSCOPY (EGD) WITH PROPOFOL;  Surgeon: Mauri Pole, MD;  Location: [facility] ENDOSCOPY;  Service: Endoscopy;  Laterality: N/A;   POLYPECTOMY  08/12/2020   Procedure: POLYPECTOMY;  Surgeon: Mauri Pole, MD;  Location: [facility] END[facility]COPY;  Service: Endoscopy;;   TUBAL LIGATION      Family Psychiatric History: Alcohol use disorder paternal and maternal family members, siblings anxiety,   Family History:  Family History  Problem Relation Age of Onset   Migraines Mother    Cancer Father    Colon cancer Father    Lupus Paternal Grandmother    Cancer Paternal Grandmother    Breast cancer Paternal Grandmother    Migraines Maternal Aunt    Hypertension Maternal Aunt    Breast cancer Maternal Aunt    Colon cancer Paternal Aunt    Breast cancer Paternal Aunt    Ovarian cancer Cousin     Social History:  Social History   Socioeconomic History   Marital status: Single    Spouse name: Not on file   Number of children: Not on file   Years of education: Not on file   Highest education level: Not on file  Occupational History   Not on file  Tobacco Use   Smoking status: Former    Types: Cigars   Smokeless tobacco: Never   Tobacco comments:    1-2 cigars/week  Vaping Use   Vaping Use: Never used  Substance and Sexual Activity   Alcohol use: Not Currently    Comment: occasional   Drug use: Yes    Frequency: 7.0 times per week    Types: Marijuana    Comment: 1 to 2 blunts a week, last use last night   Sexual activity: Yes    Birth control/protection: Condom, Surgical  Other Topics Concern   Not on file  Social History Narrative   Not on file   Social Determinants of Health   Financial Resource Strain: Medium Risk (11/08/2021)   Overall Financial Resource Strain (CARDIA)    Difficulty of Paying Living Expenses: Somewhat hard  Food Insecurity: Food Insecurity Present (01/10/2022)   Hunger Vital Sign    Worried About Running Out of Food in the Last Year:  Sometimes true    Ran Out of Food in the Last Year: Sometimes true  Transportation Needs: Unmet Transportation Needs (01/10/2022)   PRAPARE - Hydrologist (Medical): Yes    Lack of Transportation (Non-Medical): No  Physical Activity: Not on file  Stress: Not on file  Social Connections: Not on file    Allergies:  Allergies  Allergen Reactions   Mushroom Extract Complex Anaphylaxis   Latex Itching and Swelling   Penicillins Itching    Has patient had a PCN reaction causing immediate rash, facial/tongue/throat swelling, SOB or lightheadedness with hypotension: No Has patient had a PCN reaction causing severe rash involving mucus membranes or skin necrosis: No Has patient had a PCN reaction that required  hospitalization No Has patient had a PCN reaction occurring within the last 10 years: No If all of the above answers are "NO", then may proceed with Cephalosporin use.     Metabolic Disorder Labs: Lab Results  Component Value Date   HGBA1C 9.9 (A) 02/21/2022   MPG 151.33 06/22/2021   MPG 271.87 11/12/2019   No results found for: "PROLACTIN" Lab Results  Component Value Date   CHOL 147 10/04/2021   TRIG 121 10/04/2021   HDL 45 10/04/2021   CHOLHDL 3.3 10/04/2021   [place] 80 10/04/2021   Lab Results  Component Value Date   TSH 1.279 05/29/2017    Therapeutic Level Labs: No results found for: "LITHIUM" No results found for: "VALPROATE" No results found for: "CBMZ"  Current Medications: Current Outpatient Medications  Medication Sig Dispense Refill   acetaminophen (TYLENOL) 500 MG tablet Take 2 tablets (1,000 mg total) by mouth every 6 (six) hours as needed for mild pain (or Fever >/= 101). 30 tablet 0   albuterol (PROVENTIL) (2.5 MG/3ML) 0.083% nebulizer solution Take 3 mLs (2.5 mg total) by nebulization every 6 (six) hours as needed for wheezing or shortness of breath. 75 mL 0   albuterol (VENTOLIN HFA) 108 (90 Base) MCG/ACT inhaler  Inhale 2 puffs into the lungs every 6 (six) hours as needed for wheezing or shortness of breath. Reported on 09/29/2015     amLODipine (NORVASC) 5 MG tablet Take 1 tablet (5 mg total) by mouth daily. 30 tablet 11   ARIPiprazole (ABILIFY) 20 MG tablet Take 1 tablet (20 mg total) by mouth daily. 30 tablet 3   artificial tears (LACRILUBE) OINT ophthalmic ointment Place into both eyes at bedtime as needed for dry eyes. 3.5 g 0   Blood Glucose Monitoring Suppl (TRUE METRIX METER) w/Device KIT use as directed 1 kit 0   diclofenac Sodium (VOLTAREN) 1 % GEL Apply 4 g topically 4 (four) times daily. 100 g 0   dicyclomine (BENTYL) 10 MG capsule Take 1 capsule (10 mg total) by mouth 4 (four) times daily as needed for spasms (abdominal pain). 30 capsule 1   fluconazole (DIFLUCAN) 150 MG tablet Take 1 tablet (150 mg total) by mouth daily for 2 days. 2 tablet 0   fluticasone (FLONASE) 50 MCG/ACT nasal spray PLACE 1 SPRAY INTO BOTH NOSTRILS IN THE MORNING AND AT BEDTIME. (Patient taking differently: 2 sprays.) 16 g 1   gabapentin (NEURONTIN) 300 MG capsule Take 1 capsule (300 mg total) by mouth 3 (three) times daily. 90 capsule 3   glucose blood test strip Check your sugar in the morning before you eat breakfast, and one hour after a meal. 100 each 2   ibuprofen (ADVIL) 600 MG tablet Take 1 tablet (600 mg total) by mouth 3 (three) times daily. 30 tablet 0   insulin aspart (NOVOLOG) 100 UNIT/ML FlexPen Inject 10 Units into the skin 2 (two) times daily with a meal. 6 mL 0   insulin glargine (LANTUS) 100 UNIT/ML Solostar Pen Inject 20 Units into the skin daily at 10pm. 6 mL 0   Insulin Pen Needle 32G X 4 MM MISC Use as directed 4 times daily. 200 each 0   lamoTRIgine (LAMICTAL) 150 MG tablet Take 1 tablet (150 mg total) by mouth daily. 30 tablet 3   liraglutide (VICTOZA) 18 MG/3ML SOPN Inject 0.6 mg into the skin daily for 7 days, THEN 1.2 mg daily. 6 mL 0   meclizine (ANTIVERT) 25 MG tablet Take 1 tablet (25 mg  total)  by mouth 3 (three) times daily as needed for dizziness. 30 tablet 0   metFORMIN (GLUCOPHAGE) 1000 MG tablet Take 1 tablet (1,000 mg total) by mouth 2 (two) times daily with a meal. 90 tablet 3   metoprolol tartrate (LOPRESSOR) 100 MG tablet TAKE 1 TABLET BY MOUTH 2 HOURS BEFORE MRI. 1 tablet 0   metroNIDAZOLE (METROGEL) 0.75 % vaginal gel Place 1 Applicatorful vaginally at bedtime for 5 days. 70 g 1   mirtazapine (REMERON) 45 MG tablet Take 1 tablet (45 mg total) by mouth at bedtime. 30 tablet 3   omeprazole (PRILOSEC) 40 MG capsule Take 1 capsule (40 mg total) by mouth daily. 30 capsule 1   oxyCODONE (OXY IR/ROXICODONE) 5 MG immediate release tablet Take 1 tablet (5 mg total) by mouth 2 (two) times daily as needed for severe pain. 10 tablet 0   senna-docusate (SENOKOT S) 8.6-50 MG tablet Take 1 tablet by mouth daily. 90 tablet 2   SUMAtriptan (IMITREX) 50 MG tablet Take 1 tablet (50 mg total) by mouth every 2 (two) hours as needed for migraine. May repeat in 2 hours if headache persists or recurs. Please take no more than 3 tablets (150 mg) in 24 hours. 15 tablet 0   TRUEplus Lancets 28G MISC USE AS DIRECTED 100 each 0   No current facility-administered medications for this visit.     Musculoskeletal: Strength & Muscle Tone: within normal limits and telehealth visit Gait & Station: normal, telehealth visit Patient leans: N/A  Psychiatric Specialty Exam: Review of Systems  There were no vitals taken for this visit.There is no height or weight on file to calculate BMI.  General Appearance: Well Groomed  Eye Contact:  Good  Speech:  Clear and Coherent and Normal Rate  Volume:  Normal  Mood:  Anxious and Depressed  Affect:  Congruent  Thought Process:  Coherent, Goal Directed and Linear  Orientation:  Full (Time, Place, and Person)  Thought Content: Logical and Hallucinations: Auditory Tactile   Suicidal Thoughts:  No  Homicidal Thoughts:  No  Memory:  Immediate;   Good Recent;    Good Remote;   Good  Judgement:  Good  Insight:  Good  Psychomotor Activity:  Normal  Concentration:  Concentration: Good and Attention Span: Good  Recall:  Good  Fund of Knowledge: Good  Language: Good  Akathisia:  No  Handed:  Right  AIMS (if indicated): Not done  Assets:  Communication Skills Desire for Improvement Financial Resources/Insurance Housing Intimacy Social Support  ADL's:  Intact  Cognition: WNL  Sleep:  Good   Screenings: AIMS    Flowsheet Row Video Visit from 01/01/2022 in [facility] Total Score 0      GAD-7    Flowsheet Row Video Visit from 04/05/2022 in [facility] Office Visit from 02/28/2022 in [facility] [place] [facility] Video Visit from 01/01/2022 in [facility] Video Visit from 09/28/2021 in [facility] Office Visit from 07/03/2021 in [facility] [place] [facility]  Total GAD-7 Score _0 PHQ2-9    Flowsheet Row Video Visit from 04/05/2022 in [facility] Office Visit from 04/03/2022 in [place] Office Visit from 02/28/2022 in [facility] [place] [facility] Video Visit from 01/01/2022 in [facility]  [place] Office Visit from 10/04/2021 in [place]  PHQ-2 Total Score _0 PHQ-9 Total Score _1 Flowsheet Row Video Visit from 05/16/2021 in [facility] Video Visit from 04/13/2021 in [facility] ED from 04/07/2021 in [facility] [place] Error: Question 6 not populated Error: Q7 should not be populated when Q6 is No No Risk        Assessment and Plan: Patient endorses AH, hallucinations, hypomania,  anxiety,and depression.She however notes that she is able to cope with it.  At this time patient notes that she can cope with the above stressors. No medication changes made today. Patient agreeable to continue medications as prescribed. He will follow up with outpatient counseling for therapy.   1. Bipolar 2 disorder, major depressive episode (HCC)  Continue- ARIPiprazole (ABILIFY) 20 MG tablet; Take 1 tablet (20 mg total) by mouth daily.  Dispense: 30 tablet; Refill: 3 Continue- lamoTRIgine (LAMICTAL) 150 MG tablet; Take 1 tablet (150 mg total) by mouth daily.  Dispense: 30 tablet; Refill: 3 Continue- gabapentin (NEURONTIN) 300 MG capsule; Take 1 capsule (300 mg total) by mouth 3 (three) times daily.  Dispense: 90 capsule; Refill: 3 Continue- mirtazapine (REMERON) 45 MG tablet; Take 1 tablet (45 mg total) by mouth at bedtime.  Dispense: 30 tablet; Refill: 3  2. Generalized anxiety disorder  Continue- gabapentin (NEURONTIN) 300 MG capsule; Take 1 capsule (300 mg total) by mouth 3 (three) times daily.  Dispense: 90 capsule; Refill: 3 Continue- mirtazapine (REMERON) 45 MG tablet; Take 1 tablet (45 mg total) by mouth at bedtime.  Dispense: 30 tablet; Refill: 3 Continue- hydrOXYzine (ATARAX) 25 MG tablet; Take 1 tablet (25 mg total) by mouth 3 (three) times daily as needed.  Dispense: 90 tablet; Refill: 3  Follow up in 3 months Follow up with therapy  Salley Slaughter, NP 04/05/2022, 2:37 PM

## 2022-04-06 LAB — URINE CULTURE

## 2022-04-09 NOTE — Progress Notes (Signed)
Patient called and aware. No UTI. Cervicovaginal test + for candida vaginitis. She received medication for candida vulvovaginitis, now improving

## 2022-04-10 ENCOUNTER — Other Ambulatory Visit: Payer: Self-pay

## 2022-04-12 ENCOUNTER — Ambulatory Visit: Payer: Medicaid Other | Admitting: Obstetrics and Gynecology

## 2022-04-26 ENCOUNTER — Ambulatory Visit: Payer: Medicaid Other | Attending: Obstetrics and Gynecology | Admitting: Physical Therapy

## 2022-04-26 NOTE — Therapy (Deleted)
OUTPATIENT PHYSICAL THERAPY FEMALE PELVIC EVALUATION   Patient Name: Dana Hughes MRN: [id number] DOB:06-Nov-1980, 42 y.o., female Today's Date: 04/26/2022  END OF SESSION:   Past Medical History:  Diagnosis Date   Anxiety    Arthritis    Asthma    Bronchitis    BV (bacterial vaginosis)    Candidiasis    Depression    Diabetes mellitus without complication ([place])    Endometriosis    Headache(784.0)    migraines   Hypertension    no longer takes meds   Migraines    Muscle spasm    Ovarian cyst    Pelvic pain in female 09/29/2015   Scoliosis    Uterine fibroid    Past Surgical History:  Procedure Laterality Date   BIOPSY  08/12/2020   Procedure: BIOPSY;  Surgeon: Mauri Pole, MD;  Location: [facility] ENDOSCOPY;  Service: Endoscopy;;   CHOLECYSTECTOMY     COLONOSCOPY WITH PROPOFOL N/A 08/12/2020   Procedure: COLONOSCOPY WITH PROPOFOL;  Surgeon: Mauri Pole, MD;  Location: [facility] ENDOSCOPY;  Service: Endoscopy;  Laterality: N/A;   ESOPHAGOGASTRODUODENOSCOPY (EGD) WITH PROPOFOL N/A 08/12/2020   Procedure: ESOPHAGOGASTRODUODENOSCOPY (EGD) WITH PROPOFOL;  Surgeon: Mauri Pole, MD;  Location: [facility] ENDOSCOPY;  Service: Endoscopy;  Laterality: N/A;   POLYPECTOMY  08/12/2020   Procedure: POLYPECTOMY;  Surgeon: Mauri Pole, MD;  Location: [facility] ENDOSCOPY;  Service: Endoscopy;;   TUBAL LIGATION     Patient Active Problem List   Diagnosis Date Noted   Chest pain of uncertain etiology 88/50/2774   Irregular menses 07/03/2021   Uterine fibroid    Urinary incontinence    Cystocele without uterine prolapse    Visit for screening mammogram    Exertional dyspnea 06/24/2021   Homicidal ideation 06/24/2021   Rib pain on right side 06/24/2021   Marijuana dependence ([place]) 04/13/2021   PTSD (post-traumatic stress disorder) 01/19/2021   Bipolar 2 disorder, major depressive episode ([place]) 01/11/2021   Constipation 12/10/2020   Complex ovarian cyst 12/10/2020   Atypical  migraine 12/09/2020   Rectal bleeding 12/09/2020   Mild concentric left ventricular hypertrophy (LVH) 08/12/2020   History of colonic polyps 08/12/2020   Thrombocytosis 08/12/2020   History of pulmonary embolism 08/08/2020   Fibroids 05/05/2020   Iron deficiency anemia 04/08/2020   Severe episode of recurrent major depressive disorder, with psychotic features ([place]) 03/11/2020   Generalized anxiety disorder 03/11/2020   Diabetes mellitus, type 2 ([place]) 12/09/2019   Carpal tunnel syndrome, bilateral 01/16/2019   Hypertension 07/21/2018   Vaginal discharge 05/15/2018   Endometriosis 08/30/2017    PCP: Sanjuan Dame, MD   REFERRING PROVIDER:   Darliss Cheney, MD    REFERRING DIAG:  R10.2,G89.29 (ICD-10-CM) - Chronic pelvic pain in female  N94.10 (ICD-10-CM) - Female dyspareuni    THERAPY DIAG:  No diagnosis found.  Rationale for Evaluation and Treatment: {HABREHAB:27488}  ONSET DATE: ***  SUBJECTIVE:  SUBJECTIVE STATEMENT: *** Fluid intake: {Yes/No:304960894}   PAIN:  Are you having pain? {yes/no:20286} NPRS scale: ***/10 Pain location: {pelvic pain location:27098}  Pain type: {type:313116} Pain description: {PAIN DESCRIPTION:21022940}   Aggravating factors: *** Relieving factors: ***  PRECAUTIONS: {Therapy precautions:24002}  WEIGHT BEARING RESTRICTIONS: {Yes ***/No:24003}  FALLS:  Has patient fallen in last 6 months? {fallsyesno:27318}  LIVING ENVIRONMENT: Lives with: {OPRC lives with:25569::"lives with their family"} Lives in: {Lives in:25570} Stairs: {opstairs:27293} Has following equipment at home: {Assistive devices:23999}  OCCUPATION: ***  PLOF: {PLOF:24004}  PATIENT GOALS: ***  PERTINENT HISTORY:  PMH: BV, endo, uterine fibroid, ovarian cyst,  scoliosis, migraines Sexual abuse: {Yes/No:304960894}  BOWEL MOVEMENT: Pain with bowel movement: {yes/no:20286} Type of bowel movement:{PT BM type:27100} Fully empty rectum: {Yes/No:304960894} Leakage: {Yes/No:304960894} Pads: {Yes/No:304960894} Fiber supplement: {Yes/No:304960894}  URINATION: Pain with urination: {yes/no:20286} Fully empty bladder: {Yes/No:304960894} Stream: {PT urination:27102} Urgency: {Yes/No:304960894} Frequency: *** Leakage: {PT leakage:27103} Pads: {Yes/No:304960894}  INTERCOURSE: Pain with intercourse: {pain with intercourse PA:27099} Ability to have vaginal penetration:  {Yes/No:304960894} Climax: *** Marinoff Scale: ***/3  PREGNANCY: Vaginal deliveries *** Tearing {Yes***/No:304960894} C-section deliveries *** Currently pregnant {Yes***/No:304960894}  PROLAPSE: {PT prolapse:27101}   OBJECTIVE:   DIAGNOSTIC FINDINGS:  ***  PATIENT SURVEYS:  {rehab surveys:24030}  PFIQ-7 ***  COGNITION: Overall cognitive status: {cognition:24006}     SENSATION: Light touch: {intact/deficits:24005} Proprioception: {intact/deficits:24005}  MUSCLE LENGTH: Hamstrings: Right *** deg; Left *** deg Thomas test: Right *** deg; Left *** deg  LUMBAR SPECIAL TESTS:  {lumbar special test:25242}  FUNCTIONAL TESTS:  {Functional tests:24029}  GAIT: Distance walked: *** Assistive device utilized: {Assistive devices:23999} Level of assistance: {Levels of assistance:24026} Comments: ***  POSTURE: {posture:25561}  PELVIC ALIGNMENT:  LUMBARAROM/PROM:  A/PROM A/PROM  eval  Flexion   Extension   Right lateral flexion   Left lateral flexion   Right rotation   Left rotation    (Blank rows = not tested)  LOWER EXTREMITY ROM:  {AROM/PROM:27142} ROM Right eval Left eval  Hip flexion    Hip extension    Hip abduction    Hip adduction    Hip internal rotation    Hip external rotation    Knee flexion    Knee extension    Ankle dorsiflexion     Ankle plantarflexion    Ankle inversion    Ankle eversion     (Blank rows = not tested)  LOWER EXTREMITY MMT:  MMT Right eval Left eval  Hip flexion    Hip extension    Hip abduction    Hip adduction    Hip internal rotation    Hip external rotation    Knee flexion    Knee extension    Ankle dorsiflexion    Ankle plantarflexion    Ankle inversion    Ankle eversion     PALPATION:   General  ***                External Perineal Exam ***                             Internal Pelvic Floor ***  Patient confirms identification and approves PT to assess internal pelvic floor and treatment {yes/no:20286}  PELVIC MMT:   MMT eval  Vaginal   Internal Anal Sphincter   External Anal Sphincter   Puborectalis   Diastasis Recti   (Blank rows = not tested)        TONE: ***  PROLAPSE: ***  TODAY'S TREATMENT:  DATE: ***  EVAL ***   PATIENT EDUCATION:  Education details: *** Person educated: {Person educated:25204} Education method: {Education Method:25205} Education comprehension: {Education Comprehension:25206}  HOME EXERCISE PROGRAM: ***  ASSESSMENT:  CLINICAL IMPRESSION: Patient is a *** y.o. *** who was seen today for physical therapy evaluation and treatment for ***.   OBJECTIVE IMPAIRMENTS: {opptimpairments:25111}.   ACTIVITY LIMITATIONS: {activitylimitations:27494}  PARTICIPATION LIMITATIONS: {participationrestrictions:25113}  PERSONAL FACTORS: {Personal factors:25162} are also affecting patient's functional outcome.   REHAB POTENTIAL: {rehabpotential:25112}  CLINICAL DECISION MAKING: {clinical decision making:25114}  EVALUATION COMPLEXITY: {Evaluation complexity:25115}   GOALS: Goals reviewed with patient? {yes/no:20286}  SHORT TERM GOALS: Target date: ***  *** Baseline: Goal status: {GOALSTATUS:25110}  2.   *** Baseline:  Goal status: {GOALSTATUS:25110}  3.  *** Baseline:  Goal status: {GOALSTATUS:25110}  4.  *** Baseline:  Goal status: {GOALSTATUS:25110}  5.  *** Baseline:  Goal status: {GOALSTATUS:25110}  6.  *** Baseline:  Goal status: {GOALSTATUS:25110}  LONG TERM GOALS: Target date: ***  *** Baseline:  Goal status: {GOALSTATUS:25110}  2.  *** Baseline:  Goal status: {GOALSTATUS:25110}  3.  *** Baseline:  Goal status: {GOALSTATUS:25110}  4.  *** Baseline:  Goal status: {GOALSTATUS:25110}  5.  *** Baseline:  Goal status: {GOALSTATUS:25110}  6.  *** Baseline:  Goal status: {GOALSTATUS:25110}  PLAN:  PT FREQUENCY: {rehab frequency:25116}  PT DURATION: {rehab duration:25117}  PLANNED INTERVENTIONS: {rehab planned interventions:25118::"Therapeutic exercises","Therapeutic activity","Neuromuscular re-education","Balance training","Gait training","Patient/Family education","Self Care","Joint mobilization"}  PLAN FOR NEXT SESSION: ***   Camillo Flaming Laira Penninger, PT 04/26/2022, 2:56 PM

## 2022-05-01 ENCOUNTER — Emergency Department (HOSPITAL_COMMUNITY)
Admission: EM | Admit: 2022-05-01 | Discharge: 2022-05-01 | Disposition: A | Discharge status: Home / Self Care | Payer: Medicaid Other | Attending: Emergency Medicine | Admitting: Emergency Medicine

## 2022-05-01 ENCOUNTER — Other Ambulatory Visit: Payer: Self-pay

## 2022-05-01 ENCOUNTER — Encounter: Payer: Medicaid Other | Admitting: Internal Medicine

## 2022-05-01 ENCOUNTER — Encounter (HOSPITAL_COMMUNITY): Payer: Self-pay

## 2022-05-01 ENCOUNTER — Emergency Department (HOSPITAL_COMMUNITY): Payer: Medicaid Other

## 2022-05-01 ENCOUNTER — Telehealth: Payer: Self-pay | Admitting: *Deleted

## 2022-05-01 DIAGNOSIS — E871 Hypo-osmolality and hyponatremia: Secondary | ICD-10-CM | POA: Insufficient documentation

## 2022-05-01 DIAGNOSIS — Z794 Long term (current) use of insulin: Secondary | ICD-10-CM | POA: Insufficient documentation

## 2022-05-01 DIAGNOSIS — E1165 Type 2 diabetes mellitus with hyperglycemia: Secondary | ICD-10-CM | POA: Insufficient documentation

## 2022-05-01 DIAGNOSIS — Z91040 Latex allergy status: Secondary | ICD-10-CM | POA: Insufficient documentation

## 2022-05-01 DIAGNOSIS — Z79899 Other long term (current) drug therapy: Secondary | ICD-10-CM | POA: Insufficient documentation

## 2022-05-01 DIAGNOSIS — R072 Precordial pain: Secondary | ICD-10-CM | POA: Insufficient documentation

## 2022-05-01 DIAGNOSIS — R079 Chest pain, unspecified: Secondary | ICD-10-CM

## 2022-05-01 DIAGNOSIS — R770 Abnormality of albumin: Secondary | ICD-10-CM | POA: Diagnosis not present

## 2022-05-01 DIAGNOSIS — Z7984 Long term (current) use of oral hypoglycemic drugs: Secondary | ICD-10-CM | POA: Diagnosis not present

## 2022-05-01 DIAGNOSIS — D649 Anemia, unspecified: Secondary | ICD-10-CM | POA: Diagnosis not present

## 2022-05-01 DIAGNOSIS — I10 Essential (primary) hypertension: Secondary | ICD-10-CM | POA: Insufficient documentation

## 2022-05-01 LAB — TROPONIN I (HIGH SENSITIVITY)
Troponin I (High Sensitivity): 3 ng/L
Troponin I (High Sensitivity): 3 ng/L (ref <18)

## 2022-05-01 LAB — CBC
HCT: 33.4 % — ABNORMAL LOW (ref 36.0–46.0)
Hemoglobin: 9.7 g/dL — ABNORMAL LOW (ref 12.0–15.0)
MCH: 22.2 pg — ABNORMAL LOW (ref 26.0–34.0)
MCHC: 29 g/dL — ABNORMAL LOW (ref 30.0–36.0)
MCV: 76.4 fL — ABNORMAL LOW (ref 80.0–100.0)
Platelets: 404 K/uL — ABNORMAL HIGH (ref 150–400)
RBC: 4.37 MIL/uL (ref 3.87–5.11)
RDW: 15.7 % — ABNORMAL HIGH (ref 11.5–15.5)
WBC: 8.9 K/uL (ref 4.0–10.5)
nRBC: 0 % (ref 0.0–0.2)

## 2022-05-01 LAB — URINALYSIS, ROUTINE W REFLEX MICROSCOPIC
Bilirubin Urine: NEGATIVE
Glucose, UA: >=500 mg/dL — AB
Hgb urine dipstick: NEGATIVE
Ketones, ur: NEGATIVE mg/dL
Leukocytes,Ua: NEGATIVE
Nitrite: NEGATIVE
Protein, ur: NEGATIVE mg/dL
Specific Gravity, Urine: 1.023 (ref 1.005–1.030)
pH: 7 (ref 5.0–8.0)

## 2022-05-01 LAB — BASIC METABOLIC PANEL WITH GFR
Anion gap: 9 (ref 5–15)
BUN: <5 mg/dL — ABNORMAL LOW (ref 6–20)
CO2: 21 mmol/L — ABNORMAL LOW (ref 22–32)
Calcium: 8.5 mg/dL — ABNORMAL LOW (ref 8.9–10.3)
Chloride: 103 mmol/L (ref 98–111)
Creatinine, Ser: 0.64 mg/dL (ref 0.44–1.00)
GFR, Estimated: >60 mL/min
Glucose, Bld: 269 mg/dL — ABNORMAL HIGH (ref 70–99)
Potassium: 3.5 mmol/L (ref 3.5–5.1)
Sodium: 133 mmol/L — ABNORMAL LOW (ref 135–145)

## 2022-05-01 LAB — HEPATIC FUNCTION PANEL
ALT: 26 U/L (ref 0–44)
AST: 26 U/L (ref 15–41)
Albumin: 3.2 g/dL — ABNORMAL LOW (ref 3.5–5.0)
Alkaline Phosphatase: 61 U/L (ref 38–126)
Bilirubin, Direct: <0.1 mg/dL (ref 0.0–0.2)
Total Bilirubin: 0.2 mg/dL — ABNORMAL LOW (ref 0.3–1.2)
Total Protein: 6.8 g/dL (ref 6.5–8.1)

## 2022-05-01 LAB — CBG MONITORING, ED: Glucose-Capillary: 201 mg/dL — ABNORMAL HIGH (ref 70–99)

## 2022-05-01 LAB — I-STAT BETA HCG BLOOD, ED (MC, WL, AP ONLY): I-stat hCG, quantitative: <5 m[IU]/mL

## 2022-05-01 LAB — LIPASE, BLOOD: Lipase: 42 U/L (ref 11–51)

## 2022-05-01 LAB — D-DIMER, QUANTITATIVE: D-Dimer, Quant: 0.39 ug/mL-FEU (ref 0.00–0.50)

## 2022-05-01 MED ORDER — SODIUM CHLORIDE 0.9 % IV BOLUS
1000.0000 mL | Freq: Once | INTRAVENOUS | Status: AC
  Administered 2022-05-01: 1000 mL via INTRAVENOUS

## 2022-05-01 NOTE — Telephone Encounter (Signed)
I called pt - I asked pt if she still has CP today, she stated "yes, it comes and goes". I also asked if the pain is in her arm or back - she stated in her back also. She stated the pain is new when I asked her. Then she started talking about high blood sugars; she has not checked it today but yesterday, it was 514. And she asked about being checked for yeast infection. I informed pt I'm concerned with her having CP and to go to the ER. Pt stated she will go to the ER. I told her we can re-schedule her appt today for her other issues.

## 2022-05-01 NOTE — ED Provider Notes (Signed)
[facility] EMERGENCY DEPARTMENT Provider Note   CSN: [id number] Arrival date & time: 05/01/22  1410     History  Chief Complaint  Patient presents with   Chest Pain    Dana Hughes is a 42 y.o. female with history significant for hypertension, DM type II with insulin use, presents to the ED complaining of midsternal chest pain that radiates to the middle of her back with associated shortness of breath for the past 2 days.  She states she is also been having frequent urination and increased thirst with poorly controlled blood sugar at home.  She reports that she had sharp chest pain yesterday and had a near syncopal episode.  She thinks this was related to her hyper glycemia.  Patient states that she checked her blood sugar following this event and it was 516.  She states that she has not had any recent changes to her diabetic medications, but reports her blood sugar has been between 350 and 516 over the last 4 days.  Patient did double up on her insulin on one of the days and states lows her blood sugar got was 149.  Patient reports that she has been consuming an increased amount of sugar but follows it with water hoping it would dilute it.  Denies fever, abdominal pain, nausea, vomiting, diarrhea, chest tightness, syncope, weakness.       Home Medications Prior to Admission medications   Medication Sig Start Date End Date Taking? Authorizing Provider  acetaminophen (TYLENOL) 500 MG tablet Take 2 tablets (1,000 mg total) by mouth every 6 (six) hours as needed for mild pain (or Fever >/= 101). 08/12/20   Darrick Meigs, [name], MD  albuterol (PROVENTIL) (2.5 MG/3ML) 0.083% nebulizer solution Take 3 mLs (2.5 mg total) by nebulization every 6 (six) hours as needed for wheezing or shortness of breath. 07/17/21   Sanjuan Dame, MD  albuterol (VENTOLIN HFA) 108 (90 Base) MCG/ACT inhaler Inhale 2 puffs into the lungs every 6 (six) hours as needed for wheezing or shortness of  breath. Reported on 09/29/2015 07/17/21   Sanjuan Dame, MD  amLODipine (NORVASC) 5 MG tablet Take 1 tablet (5 mg total) by mouth daily. 04/03/22 04/03/23  Romana Juniper, MD  ARIPiprazole (ABILIFY) 20 MG tablet Take 1 tablet (20 mg total) by mouth daily. 04/05/22   Salley Slaughter, NP  artificial tears (LACRILUBE) OINT ophthalmic ointment Place into both eyes at bedtime as needed for dry eyes. 05/02/18   Langston Masker B, PA-C  Blood Glucose Monitoring Suppl (TRUE METRIX METER) w/Device KIT use as directed 02/09/22   Nyoka Lint, PA-C  diclofenac Sodium (VOLTAREN) 1 % GEL Apply 4 g topically 4 (four) times daily. 07/17/21   Sanjuan Dame, MD  dicyclomine (BENTYL) 10 MG capsule Take 1 capsule (10 mg total) by mouth 4 (four) times daily as needed for spasms (abdominal pain). 06/20/20   Jose Persia, MD  fluticasone (FLONASE) 50 MCG/ACT nasal spray PLACE 1 SPRAY INTO BOTH NOSTRILS IN THE MORNING AND AT BEDTIME. Patient taking differently: 2 sprays. 06/20/20 02/28/22  Jose Persia, MD  gabapentin (NEURONTIN) 300 MG capsule Take 1 capsule (300 mg total) by mouth 3 (three) times daily. 04/05/22   Eulis Canner E, NP  glucose blood test strip Check your sugar in the morning before you eat breakfast, and one hour after a meal. 02/09/22   Nyoka Lint, PA-C  ibuprofen (ADVIL) 600 MG tablet Take 1 tablet (600 mg total) by mouth 3 (three) times daily. 02/09/22  Nyoka Lint, PA-C  insulin aspart (NOVOLOG) 100 UNIT/ML FlexPen Inject 10 Units into the skin 2 (two) times daily with a meal. 02/09/22   Nyoka Lint, PA-C  insulin glargine (LANTUS) 100 UNIT/ML Solostar Pen Inject 20 Units into the skin daily at 10pm. 02/09/22   Nyoka Lint, PA-C  Insulin Pen Needle 32G X 4 MM MISC Use as directed 4 times daily. 02/09/22   Nyoka Lint, PA-C  lamoTRIgine (LAMICTAL) 150 MG tablet Take 1 tablet (150 mg total) by mouth daily. 04/05/22   Salley Slaughter, NP  liraglutide (VICTOZA) 18 MG/3ML  SOPN Inject 0.6 mg into the skin daily for 7 days, THEN 1.2 mg daily. 02/21/22 05/29/22  Sanjuan Dame, MD  meclizine (ANTIVERT) 25 MG tablet Take 1 tablet (25 mg total) by mouth 3 (three) times daily as needed for dizziness. 05/06/18   Melynda Ripple, MD  metFORMIN (GLUCOPHAGE) 1000 MG tablet Take 1 tablet (1,000 mg total) by mouth 2 (two) times daily with a meal. 02/26/22   Sanjuan Dame, MD  metoprolol tartrate (LOPRESSOR) 100 MG tablet TAKE 1 TABLET BY MOUTH 2 HOURS BEFORE MRI. 11/07/21 02/28/22  Janina Mayo, MD  metroNIDAZOLE (METROGEL) 0.75 % vaginal gel Place 1 Applicatorful vaginally at bedtime for 5 days. 03/02/22   Darliss Cheney, MD  mirtazapine (REMERON) 45 MG tablet Take 1 tablet (45 mg total) by mouth at bedtime. 04/05/22   Salley Slaughter, NP  omeprazole (PRILOSEC) 40 MG capsule Take 1 capsule (40 mg total) by mouth daily. 02/09/22   Nyoka Lint, PA-C  oxyCODONE (OXY IR/ROXICODONE) 5 MG immediate release tablet Take 1 tablet (5 mg total) by mouth 2 (two) times daily as needed for severe pain. 09/02/20   Axel Filler, MD  senna-docusate (SENOKOT S) 8.6-50 MG tablet Take 1 tablet by mouth daily. 12/14/20   Sanjuan Dame, MD  SUMAtriptan (IMITREX) 50 MG tablet Take 1 tablet (50 mg total) by mouth every 2 (two) hours as needed for migraine. May repeat in 2 hours if headache persists or recurs. Please take no more than 3 tablets (150 mg) in 24 hours. 12/10/20   Marianna Payment, MD  TRUEplus Lancets 28G MISC USE AS DIRECTED 02/09/22   Nyoka Lint, PA-C      Allergies    Mushroom extract complex, Latex, and Penicillins    Review of Systems   Review of Systems  Constitutional:  Negative for fever.  Respiratory:  Positive for shortness of breath. Negative for chest tightness.   Cardiovascular:  Positive for chest pain.  Gastrointestinal:  Negative for abdominal pain, diarrhea, nausea and vomiting.  Endocrine: Positive for polydipsia and polyuria.  Neurological:   Positive for dizziness. Negative for syncope and weakness.  All other systems reviewed and are negative.   Physical Exam Updated Vital Signs BP (!) 145/96   Pulse 90   Temp 98 F (36.7 C) (Oral)   Resp 18   Ht 5' 3.5" (1.613 m)   Wt 98.3 kg   SpO2 100%   BMI 37.79 kg/m  Physical Exam Vitals and nursing note reviewed.  Constitutional:      General: She is not in acute distress.    Appearance: She is not ill-appearing.  HENT:     Mouth/Throat:     Lips: Pink.     Mouth: Mucous membranes are moist.     Pharynx: Oropharynx is clear.  Eyes:     Pupils: Pupils are equal, round, and reactive to light.  Cardiovascular:  Rate and Rhythm: Normal rate and regular rhythm.     Pulses: Normal pulses.     Heart sounds: Normal heart sounds.  Pulmonary:     Effort: Pulmonary effort is normal. No tachypnea, accessory muscle usage or respiratory distress.     Breath sounds: Normal breath sounds and air entry. No decreased breath sounds.  Chest:     Chest wall: No tenderness.     Comments: Chest pain is not reproducible with palpation.  Patient is reporting chest pain currently is mild. Abdominal:     General: Abdomen is flat. Bowel sounds are normal. There is no distension.     Palpations: Abdomen is soft.     Tenderness: There is no abdominal tenderness.  Musculoskeletal:     Right lower leg: No edema.     Left lower leg: No edema.  Skin:    General: Skin is warm and dry.     Capillary Refill: Capillary refill takes less than 2 seconds.  Neurological:     Mental Status: She is alert. Mental status is at baseline.  Psychiatric:        Mood and Affect: Mood normal.        Behavior: Behavior normal.     ED Results / Procedures / Treatments   Labs (all labs ordered are listed, but only abnormal results are displayed) Labs Reviewed  BASIC METABOLIC PANEL - Abnormal; Notable for the following components:      Result Value   Sodium 133 (*)    CO2 21 (*)    Glucose, Bld 269  (*)    BUN <5 (*)    Calcium 8.5 (*)    All other components within normal limits  CBC - Abnormal; Notable for the following components:   Hemoglobin 9.7 (*)    HCT 33.4 (*)    MCV 76.4 (*)    MCH 22.2 (*)    MCHC 29.0 (*)    RDW 15.7 (*)    Platelets 404 (*)    All other components within normal limits  URINALYSIS, ROUTINE W REFLEX MICROSCOPIC - Abnormal; Notable for the following components:   Glucose, UA >=500 (*)    Bacteria, UA FEW (*)    All other components within normal limits  HEPATIC FUNCTION PANEL - Abnormal; Notable for the following components:   Albumin 3.2 (*)    Total Bilirubin 0.2 (*)    All other components within normal limits  CBG MONITORING, ED - Abnormal; Notable for the following components:   Glucose-Capillary 201 (*)    All other components within normal limits  D-DIMER, QUANTITATIVE  LIPASE, BLOOD  I-STAT BETA HCG BLOOD, ED ([facility], [facility], [facility] ONLY)  TROPONIN I (HIGH SENSITIVITY)  TROPONIN I (HIGH SENSITIVITY)    EKG None  Radiology DG Chest 2 View  Result Date: 05/01/2022 CLINICAL DATA:  Chest pain EXAM: CHEST - 2 VIEW COMPARISON:  Chest radiograph dated 12/14/2020 FINDINGS: Normal lung volumes. No focal consolidations. No pleural effusion or pneumothorax. The heart size and mediastinal contours are within normal limits. The visualized skeletal structures are unremarkable. IMPRESSION: No active cardiopulmonary disease. Electronically Signed   By: Darrin Nipper M.D.   On: 05/01/2022 15:34    Procedures Procedures    Medications Ordered in ED Medications  sodium chloride 0.9 % bolus 1,000 mL (0 mLs Intravenous Stopped 05/01/22 2017)    ED Course/ Medical Decision Making/ A&P  Medical Decision Making Amount and/or Complexity of Data Reviewed Labs: ordered. Radiology: ordered.   This patient presents to the ED with chief complaint(s) of chest pain, shortness of breath, near-syncope, and hyperglycemia with pertinent past medical  history of hypertension, type II DM insulin-dependent.The complaint involves an extensive differential diagnosis and also carries with it a high risk of complications and morbidity.    The differential diagnosis includes ACS, hypertensive urgency, PE, pancreatitis, hyperglycemia, DKA, HHNS, electrolyte disturbance, anemia  The initial plan is to obtain baseline labs including urinalysis, troponin, and D-dimer to rule out PE  Additional history obtained: Additional history obtained from  none Records reviewed Primary Care Documents  Initial Assessment:   On exam, patient is resting comfortably in bed.  Heart rate is normal with regular rhythm.  Lungs are clear to auscultation bilaterally with adequate lung volumes.  Abdomen is soft and nontender to palpation.  Skin is warm and dry nondiaphoretic.  Patient is overall well-appearing.  Chest pain is not reproducible with palpation or deep inspiration.  She reports that chest pain is mild, and is substernal.  No pedal edema bilaterally.  Independent ECG/labs interpretation:  The following labs were independently interpreted:  CBC significant for anemia, patient peers to have chronic anemia.  No evidence of leukocytosis. Metabolic panel significant for mild hyponatremia, elevated glucose at 269, decreased BUN, and mild hypocalcemia.  Creatinine is within normal range.  No other electrolyte disturbances. Urinalysis significant for glucosuria. Troponin negative. D-dimer negative. Hepatic function panel reveals normal LFTs, mild hypoalbuminemia. CBG recheck after fluid bolus revealed glucose of 201.  Independent visualization and interpretation of imaging: I independently visualized the following imaging with scope of interpretation limited to determining acute life threatening conditions related to emergency care: Chest x-ray, which revealed no evidence of pneumothorax, pleural effusion, or infiltrate.  Heart is of normal size.  I agree with  radiologist interpretation.  Treatment and Reassessment: Will treat patient with normal saline fluid bolus and reassess blood sugar. Upon reassessment, patient reports that she is feeling much improved.  She denies any current chest pain, shortness of breath, or dizziness.  Repeat blood sugar was 201.  Patient normally takes her insulins morning and evening and is due for her evening dosages.  Patient will take these when she arrives home.  Discussed with patient importance of consuming a low sugar diet given her recent blood sugars.  Advised patient to follow-up with her primary care provider as soon as possible.  Disposition:   The patient has been appropriately medically screened and/or stabilized in the ED. I have low suspicion for any other emergent medical condition which would require further screening, evaluation or treatment in the ED or require inpatient management. At time of discharge the patient is hemodynamically stable and in no acute distress. I have discussed work-up results and diagnosis with patient and answered all questions. Patient is agreeable with discharge plan. We discussed strict return precautions for returning to the emergency department and they verbalized understanding.  Patient states she will call her primary care provider tomorrow to schedule an appointment as soon as possible.  Advised patient to continue monitoring her blood sugars at home and return to ER if she develops any worsening of her symptoms.  Provided patient information regarding diabetic diet.         Final Clinical Impression(s) / ED Diagnoses Final diagnoses:  Chest pain, unspecified type  Hyperglycemia due to diabetes mellitus ([place])    Rx / DC Orders ED Discharge Orders  None         Pat Kocher, [place] 05/01/22 2043    Davonna Belling, MD 05/01/22 2342

## 2022-05-01 NOTE — ED Notes (Signed)
Patient ambulates to restroom, steady gait noted.

## 2022-05-01 NOTE — Telephone Encounter (Signed)
I agree, thank you.

## 2022-05-01 NOTE — Telephone Encounter (Signed)
Patient is scheduled to come in this afternoon (05/01/22)  to see Dr.Dean at 3:15pm

## 2022-05-01 NOTE — Telephone Encounter (Signed)
From: [name]  Sent: 04/28/2022  10:57 PM EST  To: [facility] Front Desk Pool  Subject: Appointment Request                              Appointment Request From: [name]    With Provider: Romana Juniper, MD [facility]]    Preferred Date Range: 05/03/2022 - 05/09/2022    Preferred Times: Wednesday Morning, Thursday Morning    Reason for visit: Office Visit    Comments:  Chest pains light headed blood sugar was 403 felt like I was about to pass out and had a sharp pain shooting pains     I called pt to f/u on her c/o CP and elevated BS on 04/28/22 - no answer; left message on pt's vm our had called and call the office if further assistance is needed.

## 2022-05-01 NOTE — Discharge Instructions (Signed)
Thank you for allowing me to be part of your care today.  I have attached information regarding hyperglycemia and nutrition information for diabetes.  Please call your primary care provider as soon as possible to schedule an appointment.  Please continue to monitor your blood sugars at home and take your insulins as prescribed.  Return to the ER if you develop any worsening of your symptoms or have any new concerns.

## 2022-05-01 NOTE — ED Provider Triage Note (Signed)
Emergency Medicine Provider Triage Evaluation Note  Dana Hughes , a 42 y.o. female  was evaluated in triage.  Pt complains of elevated glucose  Review of Systems  Positive: Burning with urination  Negative: fever  Physical Exam  BP (!) 125/94   Pulse 85   Temp 98 F (36.7 C) (Oral)   Resp 16   Ht 5' 3.5" (1.613 m)   Wt 98.3 kg   SpO2 100%   BMI 37.79 kg/m  Gen:   Awake, no distress   Resp:  Normal effort  MSK:   Moves extremities without difficulty  Other:    Medical Decision Making  Medically screening exam initiated at 2:54 PM.  Appropriate orders placed.  Irva ESTRELLA ALCARAZ was informed that the remainder of the evaluation will be completed by another provider, this initial triage assessment does not replace that evaluation, and the importance of remaining in the ED until their evaluation is complete.     Fransico Meadow, [place] 05/01/22 1457

## 2022-05-01 NOTE — ED Triage Notes (Signed)
Pt midsternal chest pain that radiates to middle of backx2d. Pt states had sharp chest pain yesterday and had a near syncopal episode. Pt c/o SOB. Pt states has frequent urinationx3-51mo. Pt states checked bs yesterday and it was 516. Pt states didn't take her insulin last night, because she was too tire. Glucose has been between 350-516 the last 4 days.

## 2022-05-02 ENCOUNTER — Ambulatory Visit: Payer: Medicaid Other | Admitting: Obstetrics and Gynecology

## 2022-05-08 ENCOUNTER — Ambulatory Visit: Payer: Medicaid Other

## 2022-05-11 ENCOUNTER — Encounter: Payer: Self-pay | Admitting: Gastroenterology

## 2022-05-11 NOTE — Telephone Encounter (Signed)
Called patient in regards to mychart message that was sent in. Patient stated she experienced pencil shaped stools with bright red bleeding last night with a burning sensation in her rectum. Also, mentions she has occasional upper abdominal pain & bloating. Not currently on omeprazole. Recommended that given her history with hemorrhoids, and recent constipation, to monitor bleeding for now & utilize prep h cream/recticare OTC and sitz bath. Advised that she keep her appointment as scheduled for next week & go to ED prior till then if symptoms worsen. Pt verbalized all understanding.

## 2022-05-15 NOTE — Progress Notes (Signed)
CC: Vaginal Discharge  HPI:   Dana Hughes is a 42 y.o. female with a past medical history of asthma, hypertension, diabetes, and anxiety who presents with persistence of vaginal discharge. She was last seen at [facility] in 03-2022.    Past Medical History:  Diagnosis Date   Anxiety    Arthritis    Asthma    Bronchitis    BV (bacterial vaginosis)    Candidiasis    Depression    Diabetes mellitus without complication ([place])    Endometriosis    Headache(784.0)    migraines   Hypertension    no longer takes meds   Migraines    Muscle spasm    Ovarian cyst    Pelvic pain in female 09/29/2015   Scoliosis    Uterine fibroid      Review of Systems:    Reports dizziness, white vaginal discharge, pruritus Denies fever, chills, sweats, nausea, vomiting, syncope   Physical Exam:  Vitals:   05/18/22 1055  BP: (!) 124/91  Pulse: 84  Temp: 98 F (36.7 C)  TempSrc: Oral  SpO2: 100%  Weight: 214 lb 3.2 oz (97.2 kg)  Height: '5\' 3"'$  (1.6 m)    General:   awake and alert, sitting comfortably in chair, cooperative, not in acute distress Lungs:   normal respiratory effort, breathing unlabored, symmetrical chest rise, no crackles or wheezing Cardiac:   regular rate and rhythm, normal S1 and S2 Neurologic:   oriented to person-place-time, moving all extremities, no gross focal deficits Psychiatric:   euthymic mood with congruent affect, intelligible speech    Assessment & Plan:   Hypertension Patient has history of hypertension managed with amlodipine. Home blood pressure readings consistent in the 110-120s/80-90s, sometimes elevated to 244-010U systolic f she is particularly stressed but this only happens 1-2 times per week. Today in clinic, BP 124/91.  - Continue amlodipine '5mg'$  q24    Diabetes mellitus, type 2 ([place]) Patient has history of diabetes managed with metformin, liraglutide, and insulin. Last A1C 02-2022 was 9.9. She takes both glargine and aspart as  prescribed. Home glucose readings are consisently in the 200s. She reports intermittent dizziness and lightheadedness, though this seems positional rather than related to hypoglycemia. Also describes gradually worsening blurry vision. Compliance to metformin and liraglutide is poor, as patient has been trying to save money. Discussed the importance of achieving glycemic control through regular medication adherence. She expressed understanding of and agreement with this plan. Referral to diabetes educator was placed last month and is still valid.  - Continue metformin '1000mg'$  q12 and liraglutide 0.'6mg'$  q24 - Continue glargine 20U q24 and aspart 10U q12 - Verify adherence at next visit in one month - Referral to ophthalmology for eye exam     Iron deficiency anemia Patient has history of iron-deficiency anemia. Most recent iron studies collected 11-2020 revealed saturation 8% and ferritin 8. She currently takes an iron supplement once every two days.  - Check iron panel and ferritin     BPPV (benign paroxysmal positional vertigo) Patient reports experiencing dizziness upon changing head positions such as sitting or lying down. She describes the room as spinning, and this sensation typically resolves spontaneously within one minute. Denies any syncopal episodes.  - Referral to physical therapy for vestibular rehabilitation    Vulvovaginal candidiasis Patient presented to [facility] for thick white vaginal discharge, was found to have candidiasis infection, and then prescribed two doses of fluconazole. She took these two doses, but the  discharge and vulvar pruritus persisted without ever fully resolving. Offered pelvic examination, which patient declined, and will re-prescribe fluconazole.  - Fluconazole '150mg'$  x2, three days apart - Return in one month for reevaluation      See Encounters Tab for problem based charting.  Patient discussed with Dr. Philipp Ovens

## 2022-05-16 ENCOUNTER — Other Ambulatory Visit (INDEPENDENT_AMBULATORY_CARE_PROVIDER_SITE_OTHER): Payer: Medicaid Other

## 2022-05-16 ENCOUNTER — Encounter: Payer: Self-pay | Admitting: Gastroenterology

## 2022-05-16 ENCOUNTER — Ambulatory Visit (INDEPENDENT_AMBULATORY_CARE_PROVIDER_SITE_OTHER): Payer: Medicaid Other | Admitting: Gastroenterology

## 2022-05-16 ENCOUNTER — Other Ambulatory Visit: Payer: Self-pay

## 2022-05-16 VITALS — BP 120/78 | HR 72 | Ht 62.5 in | Wt 211.4 lb

## 2022-05-16 DIAGNOSIS — K625 Hemorrhage of anus and rectum: Secondary | ICD-10-CM

## 2022-05-16 DIAGNOSIS — Z800 Family history of malignant neoplasm of digestive organs: Secondary | ICD-10-CM | POA: Diagnosis not present

## 2022-05-16 DIAGNOSIS — R194 Change in bowel habit: Secondary | ICD-10-CM

## 2022-05-16 DIAGNOSIS — R1013 Epigastric pain: Secondary | ICD-10-CM

## 2022-05-16 DIAGNOSIS — R1084 Generalized abdominal pain: Secondary | ICD-10-CM

## 2022-05-16 DIAGNOSIS — K6289 Other specified diseases of anus and rectum: Secondary | ICD-10-CM

## 2022-05-16 LAB — CBC WITH DIFFERENTIAL/PLATELET
Basophils Absolute: 0.1 10*3/uL (ref 0.0–0.1)
Basophils Relative: 0.6 % (ref 0.0–3.0)
Eosinophils Absolute: 0.2 10*3/uL (ref 0.0–0.7)
Eosinophils Relative: 2 % (ref 0.0–5.0)
HCT: 34.3 % — ABNORMAL LOW (ref 36.0–46.0)
Hemoglobin: 10.5 g/dL — ABNORMAL LOW (ref 12.0–15.0)
Lymphocytes Relative: 37.5 % (ref 12.0–46.0)
Lymphs Abs: 3.7 10*3/uL (ref 0.7–4.0)
MCHC: 30.7 g/dL (ref 30.0–36.0)
MCV: 72.4 fl — ABNORMAL LOW (ref 78.0–100.0)
Monocytes Absolute: 0.7 10*3/uL (ref 0.1–1.0)
Monocytes Relative: 6.8 % (ref 3.0–12.0)
Neutro Abs: 5.2 10*3/uL (ref 1.4–7.7)
Neutrophils Relative %: 53.1 % (ref 43.0–77.0)
Platelets: 481 10*3/uL — ABNORMAL HIGH (ref 150.0–400.0)
RBC: 4.73 Mil/uL (ref 3.87–5.11)
RDW: 16.8 % — ABNORMAL HIGH (ref 11.5–15.5)
WBC: 9.8 10*3/uL (ref 4.0–10.5)

## 2022-05-16 MED ORDER — DICYCLOMINE HCL 10 MG PO CAPS
10.0000 mg | ORAL_CAPSULE | Freq: Four times a day (QID) | ORAL | 1 refills | Status: DC | PRN
  Filled 2022-05-16: qty 30, 8d supply, fill #0
  Filled 2022-06-12: qty 30, 8d supply, fill #1

## 2022-05-16 MED ORDER — BISACODYL 5 MG PO TBEC
5.0000 mg | DELAYED_RELEASE_TABLET | Freq: Every day | ORAL | 0 refills | Status: DC | PRN
  Filled 2022-05-16 – 2022-08-08 (×3): qty 15, 15d supply, fill #0

## 2022-05-16 MED ORDER — AMBULATORY NON FORMULARY MEDICATION: 1 refills | Status: DC

## 2022-05-16 MED ORDER — POLYETHYLENE GLYCOL 3350 17 GM/SCOOP PO POWD
0.5000 | Freq: Every day | ORAL | 0 refills | Status: DC
  Filled 2022-05-16: qty 119, 1d supply, fill #0
  Filled 2022-08-08: qty 238, 1d supply, fill #0

## 2022-05-16 MED ORDER — NA SULFATE-K SULFATE-MG SULF 17.5-3.13-1.6 GM/177ML PO SOLN
1.0000 | Freq: Once | ORAL | 0 refills | Status: AC
  Filled 2022-05-16: qty 354, 2d supply, fill #0

## 2022-05-16 NOTE — Progress Notes (Signed)
Referring Provider: Sanjuan Dame, MD Primary Care Physician:  Sanjuan Dame, MD  Chief complaint: Bright red blood in the stool   IMPRESSION:  Blood in the stool  History of anal fissure 3/22 Tubular adenoma on colonoscopy 4/22 Poor prep on colonoscopy 4/22 Gastric erosion on EGD 4/22 Family history of colon cancer: Father diagnosed at age 42. Paternal grandparents.  Cholecystectomy for cholelithiasis  The differential is broad.  It includes outlet sources such as suspected anal fissure or hemorrhoids, as well as polyps, malignancy, ulcers, and colitis.  Given this differential and her poor prep in April, I am recommending a colonoscopy with an extended bowel purgative. Will start treatment for suspected anal fissure today. Proceed with abdominal imaging if endoscopic is non-diagnostic. Repeat CBC to monitor for development of anemia given the extent of reported bleeding. Trial of dicyclomine for symptomatic relief in the meantime. Add a dose of Metamucil.    PLAN: - CBC - Dicyclomine 10 mg QID PRN abdominal pain - Add a daily dose of Metamucil - Nitroglycerin 0.125% gel applied to the rectum TID x 6-8 weeks (not to be used with concurrent phosphodiesterase inhibitors) - Linzess 290 mcg daily x 3 days prior to colonoscopy prep - Colonoscopy with 2 day bowel prep - Low threshold for cross-sectional imaging if endoscopy is nondiagnostic  Please see the "Patient Instructions" section for addition details about the plan.  HPI: Dana Hughes is a 42 y.o. female referred by Dana Hughes for rectal bleeding. Last seen while she was hospitalized 4/22. The interval history is obtained to the patient, her boyfriend who accompanies her to this appointment and review of her electronic health record. She has diabetes, endometriosis, uterine fibroids, and hemorrhoids. She has had a cholecystectomy for gallstones.  Outpatient work-up in 2022 showed anal fissure in the setting of  unintentional weight loss, upper abdominal pain, nausea, and rectal bleeding. This was in the setting of iron deficiency anemia on oral iron in the setting of menorraghia. Unsuccessful attempt at colonoscopy 3/22 due to poor prep. She was admitted 4/22 with a PE and had endoscopic evaluation during that hospitalization.  Colonoscopy showed stool throughout the colon limiting a complete evaluation and two small cecal polyps. There was patchy erythema in the right colon. One colon polyp was a tubular adenoma and the other was actually ad lymphoid aggregate. Repeat exam recommended at the next available appointment with an extended bowel prep. However, Dana Hughes never scheduled the procedure.   EGD showed a single antral erosion.   She had no GI symptoms following her hospitalization until the recurrent of rectal pain and bleeding 6 days ago with associated constipation. She is seeing bleeding with every bowel movement. No significant abdominal pain.   Father had colon cancer diagnosed in his late 76s and ultimately died at age 76. Paternal aunt with colon polyps. Paternal grandmother and grandfather with colon cancer.  No other known family history of colon cancer or polyps. No family history of uterine/endometrial cancer, pancreatic cancer or gastric/stomach cancer.  She is concerned about cancer given her family history.    Past Medical History:  Diagnosis Date   Anxiety    Arthritis    Asthma    Bronchitis    BV (bacterial vaginosis)    Candidiasis    Depression    Diabetes mellitus without complication ([place])    Endometriosis    Headache(784.0)    migraines   Hypertension    no longer takes meds   Migraines  Muscle spasm    Ovarian cyst    Pelvic pain in female 09/29/2015   Scoliosis    Uterine fibroid     Past Surgical History:  Procedure Laterality Date   BIOPSY  08/12/2020   Procedure: BIOPSY;  Surgeon: Mauri Pole, MD;  Location: [facility];  Service: Endoscopy;;    CHOLECYSTECTOMY     COLONOSCOPY WITH PROPOFOL N/A 08/12/2020   Procedure: COLONOSCOPY WITH PROPOFOL;  Surgeon: Mauri Pole, MD;  Location: [facility];  Service: Endoscopy;  Laterality: N/A;   ESOPHAGOGASTRODUODENOSCOPY (EGD) WITH PROPOFOL N/A 08/12/2020   Procedure: ESOPHAGOGASTRODUODENOSCOPY (EGD) WITH PROPOFOL;  Surgeon: Mauri Pole, MD;  Location: [facility];  Service: Endoscopy;  Laterality: N/A;   POLYPECTOMY  08/12/2020   Procedure: POLYPECTOMY;  Surgeon: Mauri Pole, MD;  Location: [facility];  Service: Endoscopy;;   TUBAL LIGATION      Current Outpatient Medications  Medication Sig Dispense Refill   acetaminophen (TYLENOL) 500 MG tablet Take 2 tablets (1,000 mg total) by mouth every 6 (six) hours as needed for mild pain (or Fever >/= 101). 30 tablet 0   albuterol (PROVENTIL) (2.5 MG/3ML) 0.083% nebulizer solution Take 3 mLs (2.5 mg total) by nebulization every 6 (six) hours as needed for wheezing or shortness of breath. 75 mL 0   albuterol (VENTOLIN HFA) 108 (90 Base) MCG/ACT inhaler Inhale 2 puffs into the lungs every 6 (six) hours as needed for wheezing or shortness of breath. Reported on 09/29/2015     AMBULATORY NON FORMULARY MEDICATION Nitroglycerin 0.125% gel - apply a pea size amount to your rectum three times daily x 6-8 weeks. 45 g 1   amLODipine (NORVASC) 5 MG tablet Take 1 tablet (5 mg total) by mouth daily. 30 tablet 11   ARIPiprazole (ABILIFY) 20 MG tablet Take 1 tablet (20 mg total) by mouth daily. 30 tablet 3   artificial tears (LACRILUBE) OINT ophthalmic ointment Place into both eyes at bedtime as needed for dry eyes. 3.5 g 0   bisacodyl (DULCOLAX) 5 MG EC tablet Take 1 tablet (5 mg total) by mouth daily as needed for moderate constipation. 15 tablet 0   Blood Glucose Monitoring Suppl (TRUE METRIX METER) w/Device KIT use as directed 1 kit 0   diclofenac Sodium (VOLTAREN) 1 % GEL Apply 4 g topically 4 (four) times daily. 100 g 0   gabapentin  (NEURONTIN) 300 MG capsule Take 1 capsule (300 mg total) by mouth 3 (three) times daily. 90 capsule 3   glucose blood test strip Check your sugar in the morning before you eat breakfast, and one hour after a meal. 100 each 2   ibuprofen (ADVIL) 600 MG tablet Take 1 tablet (600 mg total) by mouth 3 (three) times daily. 30 tablet 0   insulin aspart (NOVOLOG) 100 UNIT/ML FlexPen Inject 10 Units into the skin 2 (two) times daily with a meal. 6 mL 0   insulin glargine (LANTUS) 100 UNIT/ML Solostar Pen Inject 20 Units into the skin daily at 10pm. 6 mL 0   Insulin Pen Needle 32G X 4 MM MISC Use as directed 4 times daily. 200 each 0   lamoTRIgine (LAMICTAL) 150 MG tablet Take 1 tablet (150 mg total) by mouth daily. 30 tablet 3   liraglutide (VICTOZA) 18 MG/3ML SOPN Inject 0.6 mg into the skin daily for 7 days, THEN 1.2 mg daily. 6 mL 0   meclizine (ANTIVERT) 25 MG tablet Take 1 tablet (25 mg total) by mouth 3 (three) times  daily as needed for dizziness. 30 tablet 0   metFORMIN (GLUCOPHAGE) 1000 MG tablet Take 1 tablet (1,000 mg total) by mouth 2 (two) times daily with a meal. 90 tablet 3   metroNIDAZOLE (METROGEL) 0.75 % vaginal gel Place 1 Applicatorful vaginally at bedtime for 5 days. 70 g 1   mirtazapine (REMERON) 45 MG tablet Take 1 tablet (45 mg total) by mouth at bedtime. 30 tablet 3   Na Sulfate-K Sulfate-Mg Sulf 17.5-3.13-1.6 GM/177ML SOLN Take 1 kit by mouth once for 1 dose. 354 mL 0   omeprazole (PRILOSEC) 40 MG capsule Take 1 capsule (40 mg total) by mouth daily. 30 capsule 1   oxyCODONE (OXY IR/ROXICODONE) 5 MG immediate release tablet Take 1 tablet (5 mg total) by mouth 2 (two) times daily as needed for severe pain. 10 tablet 0   polyethylene glycol powder (GLYCOLAX/MIRALAX) 17 GM/SCOOP powder Take 127.5 g by mouth daily. 119 g 0   senna-docusate (SENOKOT S) 8.6-50 MG tablet Take 1 tablet by mouth daily. 90 tablet 2   SUMAtriptan (IMITREX) 50 MG tablet Take 1 tablet (50 mg total) by mouth every  2 (two) hours as needed for migraine. May repeat in 2 hours if headache persists or recurs. Please take no more than 3 tablets (150 mg) in 24 hours. 15 tablet 0   TRUEplus Lancets 28G MISC USE AS DIRECTED 100 each 0   dicyclomine (BENTYL) 10 MG capsule Take 1 capsule (10 mg total) by mouth 4 (four) times daily as needed for spasms (abdominal pain). 30 capsule 1   fluticasone (FLONASE) 50 MCG/ACT nasal spray PLACE 1 SPRAY INTO BOTH NOSTRILS IN THE MORNING AND AT BEDTIME. (Patient taking differently: 2 sprays.) 16 g 1   metoprolol tartrate (LOPRESSOR) 100 MG tablet TAKE 1 TABLET BY MOUTH 2 HOURS BEFORE MRI. 1 tablet 0   No current facility-administered medications for this visit.    Allergies as of 05/16/2022 - Review Complete 05/16/2022  Allergen Reaction Noted   Mushroom extract complex Anaphylaxis 06/05/2014   Latex Itching and Swelling 07/07/2017   Penicillins Itching 01/18/2011    Family History  Problem Relation Age of Onset   Migraines Mother    Cancer Father    Colon cancer Father    Lupus Paternal Grandmother    Cancer Paternal Grandmother    Breast cancer Paternal Grandmother    Migraines Maternal Aunt    Hypertension Maternal Aunt    Breast cancer Maternal Aunt    Colon cancer Paternal Aunt    Breast cancer Paternal Aunt    Ovarian cancer Cousin       Physical Exam: General:   Alert,  well-nourished, pleasant and cooperative in NAD Head:  Normocephalic and atraumatic. Eyes:  Sclera clear, no icterus.   Conjunctiva pink. Ears:  Normal auditory acuity. Nose:  No deformity, discharge,  or lesions. Mouth:  No deformity or lesions.   Neck:  Supple; no masses or thyromegaly. Lungs:  Clear throughout to auscultation.   No wheezes. Heart:  Regular rate and rhythm; no murmurs. Abdomen:  Soft,nontender, nondistended, normal bowel sounds, no rebound or guarding. No hepatosplenomegaly.   Rectal:  Deferred  Msk:  Symmetrical. No boney deformities LAD: No inguinal or  umbilical LAD Extremities:  No clubbing or edema. Neurologic:  Alert and  oriented x4;  grossly nonfocal Skin:  Intact without significant lesions or rashes. Psych:  Alert and cooperative. Normal mood and affect.   Adeeb Konecny L. Tarri Glenn, MD, MPH 05/16/2022, 5:01 PM

## 2022-05-16 NOTE — Patient Instructions (Addendum)
Your provider has requested that you go to the basement level for lab work before leaving today. Press "B" on the elevator. The lab is located at the first door on the left as you exit the elevator.  We have sent a prescription for nitroglycerin 0.125% gel to [facility]. You should apply a pea size amount to your rectum three times daily x 6-8 weeks.  [facility]'s information is below: Address: [address], [place], [place] [address]  [phone]  *Please DO NOT go directly from our office to pick up this medication! Give the pharmacy 1 day to process the prescription as this is compounded and takes time to make.  We have sent the following medications to your pharmacy for you to pick up at your convenience: Dicyclomine 10 mg four times daily as needed for abdominal pain.   Add a daily dose of Metamucil.   You have been scheduled for a colonoscopy. Please follow written instructions given to you at your visit today.  Please pick up your prep supplies at the pharmacy within the next 1-3 days. If you use inhalers (even only as needed), please bring them with you on the day of your procedure.  _______________________________________________________  If your blood pressure at your visit was 140/90 or greater, please contact your primary care physician to follow up on this.  _______________________________________________________  If you are age 19 or older, your body mass index should be between 23-30. Your Body mass index is 38.04 kg/m. If this is out of the aforementioned range listed, please consider follow up with your Primary Care Provider.  If you are age 31 or younger, your body mass index should be between 19-25. Your Body mass index is 38.04 kg/m. If this is out of the aformentioned range listed, please consider follow up with your Primary Care Provider.   ________________________________________________________  The [facility] providers would like  to encourage you to use [facility] to communicate with providers for non-urgent requests or questions.  Due to long hold times on the telephone, sending your provider a message by [facility] may be a faster and more efficient way to get a response.  Please allow 48 business hours for a response.  Please remember that this is for non-urgent requests.  _______________________________________________________

## 2022-05-17 ENCOUNTER — Telehealth: Payer: Self-pay | Admitting: Gastroenterology

## 2022-05-17 NOTE — Telephone Encounter (Signed)
Good Morning Dr. Tarri Glenn,   Patient called stating she needed to reschedule her appointment for her colonoscopy with you on 1/30 at 10:00 due to not having transportation.   Patient was rescheduled for 2/9 at 8:30

## 2022-05-18 ENCOUNTER — Encounter: Payer: Self-pay | Admitting: Student

## 2022-05-18 ENCOUNTER — Other Ambulatory Visit: Payer: Self-pay

## 2022-05-18 ENCOUNTER — Ambulatory Visit (INDEPENDENT_AMBULATORY_CARE_PROVIDER_SITE_OTHER): Payer: Medicaid Other | Admitting: Student

## 2022-05-18 ENCOUNTER — Other Ambulatory Visit (HOSPITAL_COMMUNITY): Payer: Self-pay

## 2022-05-18 VITALS — BP 124/91 | HR 84 | Temp 98.0°F | Ht 63.0 in | Wt 214.2 lb

## 2022-05-18 DIAGNOSIS — I10 Essential (primary) hypertension: Secondary | ICD-10-CM | POA: Diagnosis not present

## 2022-05-18 DIAGNOSIS — K625 Hemorrhage of anus and rectum: Secondary | ICD-10-CM

## 2022-05-18 DIAGNOSIS — B3731 Acute candidiasis of vulva and vagina: Secondary | ICD-10-CM | POA: Diagnosis not present

## 2022-05-18 DIAGNOSIS — Z87891 Personal history of nicotine dependence: Secondary | ICD-10-CM | POA: Diagnosis not present

## 2022-05-18 DIAGNOSIS — H8110 Benign paroxysmal vertigo, unspecified ear: Secondary | ICD-10-CM

## 2022-05-18 DIAGNOSIS — D509 Iron deficiency anemia, unspecified: Secondary | ICD-10-CM

## 2022-05-18 DIAGNOSIS — Z7984 Long term (current) use of oral hypoglycemic drugs: Secondary | ICD-10-CM

## 2022-05-18 DIAGNOSIS — Z794 Long term (current) use of insulin: Secondary | ICD-10-CM | POA: Diagnosis not present

## 2022-05-18 DIAGNOSIS — E119 Type 2 diabetes mellitus without complications: Secondary | ICD-10-CM | POA: Diagnosis not present

## 2022-05-18 HISTORY — DX: Benign paroxysmal vertigo, unspecified ear: H81.10

## 2022-05-18 MED ORDER — FLUCONAZOLE 100 MG PO TABS
150.0000 mg | ORAL_TABLET | Freq: Every day | ORAL | 0 refills | Status: AC
  Filled 2022-05-18: qty 3, 2d supply, fill #0
  Filled 2022-05-21: qty 3, 4d supply, fill #0

## 2022-05-18 NOTE — Assessment & Plan Note (Signed)
Patient has history of hypertension managed with amlodipine. Home blood pressure readings consistent in the 110-120s/80-90s, sometimes elevated to 536-144R systolic f she is particularly stressed but this only happens 1-2 times per week. Today in clinic, BP 124/91.  - Continue amlodipine '5mg'$  q24

## 2022-05-18 NOTE — Assessment & Plan Note (Signed)
Patient reports experiencing dizziness upon changing head positions such as sitting or lying down. She describes the room as spinning, and this sensation typically resolves spontaneously within one minute. Denies any syncopal episodes.  - Referral to physical therapy for vestibular rehabilitation

## 2022-05-18 NOTE — Assessment & Plan Note (Signed)
Patient presented to [facility] on 03-2022 for thick white vaginal discharge, was found to have candidiasis infection, and then prescribed two doses of fluconazole. She took these two doses, but the discharge and vulvar pruritus persisted without ever fully resolving. Offered pelvic examination, which patient declined, and will re-prescribe fluconazole.  - Fluconazole '150mg'$  x2, three days apart - Return in one month for reevaluation

## 2022-05-18 NOTE — Assessment & Plan Note (Signed)
Patient has history of diabetes managed with metformin, liraglutide, and insulin. Last A1C 02-2022 was 9.9. She takes both glargine and aspart as prescribed. Home glucose readings are consisently in the 200s. She reports intermittent dizziness and lightheadedness, though this seems positional rather than related to hypoglycemia. Also describes gradually worsening blurry vision. Compliance to metformin and liraglutide is poor, as patient has been trying to save money. Discussed the importance of achieving glycemic control through regular medication adherence. She expressed understanding of and agreement with this plan. Referral to diabetes educator was placed last month and is still valid.  - Continue metformin '1000mg'$  q12 and liraglutide 0.'6mg'$  q24 - Continue glargine 20U q24 and aspart 10U q12 - Verify adherence at next visit in one month - Referral to ophthalmology for eye exam

## 2022-05-18 NOTE — Patient Instructions (Addendum)
  Thank you, Dana Hughes, for allowing [place] to provide your care today. Today we discussed . . .  > Diabetes       - please start to take your medications regularly, let us know if you encounter difficulty affording them > Candidiasis       - we are prescribing you another two doses of fluconazole for your yeast infection, please take these three days apart > Vertigo       - we are sending you a referral to vestibular rehabilitation for your positional dizziness symptoms   I have ordered the following labs for you:  Lab Orders         Iron and IBC ([id number])         Ferritin       Tests ordered today:  none   Referrals ordered today:   Referral Orders         Ambulatory referral to Ophthalmology       I have ordered the following medication/changed the following medications:   Stop the following medications: There are no discontinued medications.   Start the following medications: Meds ordered this encounter  Medications   fluconazole (DIFLUCAN) 100 MG tablet    Sig: Take 1.5 tablets (150 mg total) by mouth daily for 2 doses. Please take 1.5 tablets once daily for two days total, but three days apart    Dispense:  3 tablet    Refill:  0      Follow up: 1 month   Remember:  Please start taking your diabetes medications regularly. We are providing you with another two doses of fluconazole. Let us know if your infection does not improve. We will see you again in about one month!   Should you have any questions or concerns please call the internal medicine clinic at [phone].     Roswell Nickel, MD [place]

## 2022-05-18 NOTE — Assessment & Plan Note (Signed)
Patient has history of iron-deficiency anemia. Most recent iron studies collected 11-2020 revealed saturation 8% and ferritin 8. She currently takes an iron supplement once every two days.  - Check iron panel and ferritin

## 2022-05-19 LAB — IRON AND TIBC
Iron Saturation: 4 % — CL (ref 15–55)
Iron: 18 ug/dL — ABNORMAL LOW (ref 27–159)
Total Iron Binding Capacity: 408 ug/dL (ref 250–450)
UIBC: 390 ug/dL (ref 131–425)

## 2022-05-19 LAB — FERRITIN: Ferritin: 9 ng/mL — ABNORMAL LOW (ref 15–150)

## 2022-05-21 ENCOUNTER — Other Ambulatory Visit: Payer: Self-pay

## 2022-05-21 ENCOUNTER — Other Ambulatory Visit (HOSPITAL_COMMUNITY): Payer: Self-pay

## 2022-05-22 ENCOUNTER — Encounter: Payer: Medicaid Other | Admitting: Gastroenterology

## 2022-05-28 ENCOUNTER — Other Ambulatory Visit: Payer: Self-pay

## 2022-05-28 NOTE — Progress Notes (Signed)
Internal Medicine Clinic Attending  Case discussed with Dr. Jodi Mourning  At the time of the visit.  We reviewed the resident's history and exam and pertinent patient test results.  I agree with the assessment, diagnosis, and plan of care documented in the resident's note.

## 2022-05-29 ENCOUNTER — Other Ambulatory Visit: Payer: Self-pay

## 2022-05-29 ENCOUNTER — Telehealth: Payer: Self-pay | Admitting: Gastroenterology

## 2022-05-29 ENCOUNTER — Telehealth: Payer: Self-pay

## 2022-05-29 DIAGNOSIS — D509 Iron deficiency anemia, unspecified: Secondary | ICD-10-CM

## 2022-05-29 NOTE — Telephone Encounter (Signed)
-----   Message from Yevette Edwards, RN sent at 05/23/2022  9:57 AM EST -----  ----- Message ----- From: Thornton Park, MD Sent: 05/22/2022   6:37 PM EST To: Yevette Edwards, RN  Thanks. Results  reviewed. Her PCP started her on oral iron. Will need to repeat iron studies after she is on oral studies for 3 months. Thanks.   ----- Message ----- From: Yevette Edwards, RN Sent: 05/22/2022   4:54 PM EST To: Thornton Park, MD; Marice Potter, RN  Dr. Tarri Glenn, you recommended IBC + Ferritin for this patient. She had these drawn on 05/18/22 with Internal Medicine. The results are in epic for review. Thanks  Roetta Sessions ----- Message ----- From: Marice Potter, RN Sent: 05/21/2022  12:00 AM EST To: Marice Potter, RN  Check for iron and ferritin results and notify Dr. Tarri Glenn.

## 2022-05-29 NOTE — Telephone Encounter (Signed)
Good morning Dr. Tarri Glenn,   Patient called stating that she needed to reschedule her colonoscopy that is scheduled for 2/9 at 8:30 due to having another appointment on the same day at the same time.   Patient was rescheduled for 2/16 at 8:30

## 2022-05-29 NOTE — Telephone Encounter (Signed)
Lab order and reminder placed for iron panel in 3 months.

## 2022-05-30 NOTE — Telephone Encounter (Signed)
Noted. Thanks.

## 2022-06-01 ENCOUNTER — Encounter: Payer: Medicaid Other | Admitting: Gastroenterology

## 2022-06-08 ENCOUNTER — Encounter: Payer: Medicaid Other | Admitting: Gastroenterology

## 2022-06-08 ENCOUNTER — Ambulatory Visit: Payer: Medicaid Other | Admitting: Physical Therapy

## 2022-06-12 ENCOUNTER — Other Ambulatory Visit: Payer: Self-pay

## 2022-06-12 ENCOUNTER — Encounter: Payer: Self-pay | Admitting: Student

## 2022-06-12 ENCOUNTER — Ambulatory Visit (INDEPENDENT_AMBULATORY_CARE_PROVIDER_SITE_OTHER): Payer: Medicaid Other | Admitting: Student

## 2022-06-12 ENCOUNTER — Other Ambulatory Visit (HOSPITAL_COMMUNITY): Payer: Self-pay

## 2022-06-12 ENCOUNTER — Encounter: Payer: Medicaid Other | Admitting: Student

## 2022-06-12 VITALS — BP 125/88 | HR 99 | Temp 98.1°F | Ht 63.0 in | Wt 207.3 lb

## 2022-06-12 DIAGNOSIS — Z0000 Encounter for general adult medical examination without abnormal findings: Secondary | ICD-10-CM | POA: Insufficient documentation

## 2022-06-12 DIAGNOSIS — G8929 Other chronic pain: Secondary | ICD-10-CM

## 2022-06-12 DIAGNOSIS — R0683 Snoring: Secondary | ICD-10-CM | POA: Diagnosis not present

## 2022-06-12 DIAGNOSIS — Z794 Long term (current) use of insulin: Secondary | ICD-10-CM

## 2022-06-12 DIAGNOSIS — Z23 Encounter for immunization: Secondary | ICD-10-CM

## 2022-06-12 DIAGNOSIS — E119 Type 2 diabetes mellitus without complications: Secondary | ICD-10-CM

## 2022-06-12 DIAGNOSIS — M549 Dorsalgia, unspecified: Secondary | ICD-10-CM | POA: Insufficient documentation

## 2022-06-12 DIAGNOSIS — M545 Low back pain, unspecified: Secondary | ICD-10-CM

## 2022-06-12 LAB — POCT GLYCOSYLATED HEMOGLOBIN (HGB A1C): Hemoglobin A1C: 12.6 % — AB (ref 4.0–5.6)

## 2022-06-12 LAB — GLUCOSE, CAPILLARY: Glucose-Capillary: 339 mg/dL — ABNORMAL HIGH (ref 70–99)

## 2022-06-12 MED ORDER — METFORMIN HCL 1000 MG PO TABS
1000.0000 mg | ORAL_TABLET | Freq: Two times a day (BID) | ORAL | 3 refills | Status: DC
  Filled 2022-06-12 – 2022-06-13 (×2): qty 90, 45d supply, fill #0
  Filled 2022-08-08 – 2023-05-31 (×2): qty 90, 45d supply, fill #1

## 2022-06-12 MED ORDER — INSULIN ASPART 100 UNIT/ML FLEXPEN
10.0000 [IU] | PEN_INJECTOR | Freq: Two times a day (BID) | SUBCUTANEOUS | 0 refills | Status: DC
  Filled 2022-06-12 – 2023-05-31 (×3): qty 6, 30d supply, fill #0

## 2022-06-12 MED ORDER — LIDOCAINE 5 % EX PTCH
1.0000 | MEDICATED_PATCH | CUTANEOUS | 0 refills | Status: DC
  Filled 2022-06-12 – 2022-08-08 (×3): qty 30, 30d supply, fill #0

## 2022-06-12 MED ORDER — TRULICITY 0.75 MG/0.5ML ~~LOC~~ SOAJ
0.7500 mg | SUBCUTANEOUS | 0 refills | Status: DC
  Filled 2022-06-12 – 2022-06-27 (×2): qty 2, 28d supply, fill #0

## 2022-06-12 MED ORDER — INSULIN GLARGINE 100 UNIT/ML SOLOSTAR PEN
20.0000 [IU] | PEN_INJECTOR | Freq: Every day | SUBCUTANEOUS | 0 refills | Status: DC
  Filled 2022-06-12 – 2022-06-27 (×2): qty 6, 30d supply, fill #0

## 2022-06-12 NOTE — Assessment & Plan Note (Signed)
Dana Hughes is presenting to clinic today to discuss acute on chronic lower back pain. She states she has suffered from chronic back pain for the last 20 years and was told at one point she had sciatica. She did previously visit a spine specialist who told her she did not have scoliosis, but she is insistent that she does. Over the last few weeks she reports her back pain has worsened. Describes non-radiating lower back pain without radiculopathy, no direct association with certain movements. She denies any new bladder incontinence, denies bowel incontinence, fevers, weight loss, drug use. She has been taking tylenol with as needed ibuprofen, but reports the back pain is limiting her functionally.  On previous CXR it does not appear patient's spine is mis-aligned, I am unsure about the diagnosis of scoliosis. Today her exam was re-assuring with paraspinal tenderness and normal neurologic exam in her lower extremities. We discussed conservative measures for this pain, including an addition of lidocaine patches. I am hesitant to initiate duloxetine given her history of bipolar disorder. If persistent could consider short trial of muscle relaxers.  - Continue Tylenol 1090m three times daily - Ibuprofen 6071mdaily as needed for severe pain - Lidocaine patches ordered

## 2022-06-12 NOTE — Assessment & Plan Note (Signed)
-   Tdap given today

## 2022-06-12 NOTE — Progress Notes (Signed)
CC: back pain  HPI:  Dana Hughes is a 42 y.o. person with medical history as below presenting to [facility] for back pain.  Please see problem-based list for further details, assessments, and plans.  Past Medical History:  Diagnosis Date   Anxiety    Arthritis    Asthma    Bronchitis    BV (bacterial vaginosis)    Candidiasis    Depression    Diabetes mellitus without complication ([place])    Endometriosis    Headache(784.0)    migraines   Hypertension    no longer takes meds   Migraines    Muscle spasm    Ovarian cyst    Pelvic pain in female 09/29/2015   Scoliosis    Uterine fibroid    Review of Systems:  As per HPI  Physical Exam:  Vitals:   06/12/22 1459  BP: 125/88  Pulse: 99  Temp: 98.1 F (36.7 C)  TempSrc: Oral  SpO2: 99%  Weight: 207 lb 4.8 oz (94 kg)  Height: 5' 3"$  (1.6 m)   General: Resting comfortably in no acute distress CV: Regular rate, rhythm. No murmurs. Warm extremities.  Pulm: Normal work of breathing on room air. Clear to auscultation bilaterally.  MSK: Normal bulk, tone. No bony spinal tenderness, mild bilateral paraspinal tenderness in upper lumbar area. Hip range of motion normal.  Skin: Warm, dry. No rashes or lesions appreciated.  Neuro: Awake, alert, conversing appropriately. Motor 5/5 lower extremities, sensation in tact bilateral lower extremities.  Psych: Normal mood, affect, speech.   Assessment & Plan:   Diabetes mellitus, type 2 (HCC) A1c today 12.9% from 9.9% at last visit. Patient reports she typically checks her sugars once on most days, typically readings in the 200-300's. She does take her medications on most days, will sometimes miss her insulin a time or two each week. She denies any hypoglycemic symptoms. She is not currently on metformin and has not picked up or started on the liraglutide. Overall she feels as though she has low energy and has polyuria and polydipsia.   I expressed my concern for Dana Hughes over her  sugars and urged her to check her sugars at least twice daily, once fasting and post-prandial. I am going to re-start her metformin. In order to help with compliance I will put her on a once weekly GLP-1 to hopefully help with glycemic control and weight. She is amenable to this today. I have instructed her to follow-up in two weeks and to bring in a log of her sugars so we may adjust her insulin accordingly.   - Continue glargine 20u daily - Continue Novolog 10u with meals - Re-start metformin 1052m twice daily - Start Trulicity 0XX123456once weekly, up-titrate in one month - BMP and lipid panel today - Return to clinic in two weeks with log of sugars - Foot exam at next visit  Healthcare maintenance - Tdap given today  Acute back pain Ms. FRozeboomis presenting to clinic today to discuss acute on chronic lower back pain. She states she has suffered from chronic back pain for the last 20 years and was told at one point she had sciatica. She did previously visit a spine specialist who told her she did not have scoliosis, but she is insistent that she does. Over the last few weeks she reports her back pain has worsened. Describes non-radiating lower back pain without radiculopathy, no direct association with certain movements. She denies any new bladder incontinence, denies  bowel incontinence, fevers, weight loss, drug use. She has been taking tylenol with as needed ibuprofen, but reports the back pain is limiting her functionally.  On previous CXR it does not appear patient's spine is mis-aligned, I am unsure about the diagnosis of scoliosis. Today her exam was re-assuring with paraspinal tenderness and normal neurologic exam in her lower extremities. We discussed conservative measures for this pain, including an addition of lidocaine patches. I am hesitant to initiate duloxetine given her history of bipolar disorder. If persistent could consider short trial of muscle relaxers.  - Continue Tylenol  1021m three times daily - Ibuprofen 6083mdaily as needed for severe pain - Lidocaine patches ordered  Patient discussed with Dr.  MaRichrd SoxMD Internal Medicine PGY-3 Pager: [phone]

## 2022-06-12 NOTE — Assessment & Plan Note (Addendum)
A1c today 12.9% from 9.9% at last visit. Patient reports she typically checks her sugars once on most days, typically readings in the 200-300's. She does take her medications on most days, will sometimes miss her insulin a time or two each week. She denies any hypoglycemic symptoms. She is not currently on metformin and has not picked up or started on the liraglutide. Overall she feels as though she has low energy and has polyuria and polydipsia.   I expressed my concern for Dana Hughes over her sugars and urged her to check her sugars at least twice daily, once fasting and post-prandial. I am going to re-start her metformin. In order to help with compliance I will put her on a once weekly GLP-1 to hopefully help with glycemic control and weight. She is amenable to this today. I have instructed her to follow-up in two weeks and to bring in a log of her sugars so we may adjust her insulin accordingly.   - Continue glargine 20u daily - Continue Novolog 10u with meals - Re-start metformin 1086m twice daily - Start Trulicity 0XX123456once weekly, up-titrate in one month - BMP and lipid panel today - Return to clinic in two weeks with log of sugars - Foot exam at next visit

## 2022-06-12 NOTE — Patient Instructions (Signed)
Dana Hughes, it was a pleasure seeing you today!  Today we discussed: - Back pain: Continue taking Tylenol and ibuprofen when the back pain is especially bad. I am going to add on a lidocaine patch for this as well.  - Diabetes: Your A1c is 12.6%, which is very high. I have re-filled your insulin regimen. Please also take metformin. I have also added on a medication that you take once weekly. Please make sure you are checking your sugars three times daily and bring this log back with you to clinic in a few weeks.  - I have ordered a sleep study.  - I am going to check your kidneys and cholesterol today.   I have ordered the following labs today:   Lab Orders         Glucose, capillary         Lipid Profile         POC Hbg A1C      I have ordered the following medication/changed the following medications:   Start the following medications: Meds ordered this encounter  Medications   insulin aspart (NOVOLOG) 100 UNIT/ML FlexPen    Sig: Inject 10 Units into the skin 2 (two) times daily with a meal.    Dispense:  6 mL    Refill:  0    IM program   insulin glargine (LANTUS) 100 UNIT/ML Solostar Pen    Sig: Inject 20 Units into the skin daily at 10pm.    Dispense:  6 mL    Refill:  0    IM program   metFORMIN (GLUCOPHAGE) 1000 MG tablet    Sig: Take 1 tablet (1,000 mg total) by mouth 2 (two) times daily with a meal.    Dispense:  90 tablet    Refill:  3    IM program   Dulaglutide (TRULICITY) [id number] SOPN    Sig: Inject 0.75 mg into the skin once a week.    Dispense:  2 mL    Refill:  0   lidocaine (LIDODERM) 5 %    Sig: Place 1 patch onto the skin daily. Remove & Discard patch within 12 hours or as directed by MD    Dispense:  30 patch    Refill:  0     Follow-up:  2 weeks    Please make sure to arrive 15 minutes prior to your next appointment. If you arrive late, you may be asked to reschedule.   We look forward to seeing you next time. Please call our  clinic at [phone] if you have any questions or concerns. The best time to call is Monday-Friday from 9am-4pm, but there is someone available 24/7. If after hours or the weekend, call the main hospital number and ask for the Internal Medicine Resident On-Call. If you need medication refills, please notify your pharmacy one week in advance and they will send [place] a request.  Thank you for letting us take part in your care. Wishing you the best!  Thank you, Sanjuan Dame, MD

## 2022-06-13 ENCOUNTER — Encounter: Payer: Self-pay | Admitting: Student

## 2022-06-13 ENCOUNTER — Other Ambulatory Visit (HOSPITAL_COMMUNITY): Payer: Self-pay

## 2022-06-13 ENCOUNTER — Other Ambulatory Visit: Payer: Self-pay

## 2022-06-13 LAB — LIPID PANEL
Chol/HDL Ratio: 3.8 ratio (ref 0.0–4.4)
Cholesterol, Total: 152 mg/dL (ref 100–199)
HDL: 40 mg/dL (ref >39)
LDL Chol Calc (NIH): 76 mg/dL (ref 0–99)
Triglycerides: 216 mg/dL — ABNORMAL HIGH (ref 0–149)
VLDL Cholesterol Cal: 36 mg/dL (ref 5–40)

## 2022-06-14 NOTE — Progress Notes (Signed)
Internal Medicine Clinic Attending  Case discussed with Dr. Collene Gobble  At the time of the visit.  We reviewed the resident's history and exam and pertinent patient test results.  I agree with the assessment, diagnosis, and plan of care documented in the resident's note.    I agree with Dr. Lorelee Cover decision to defer statin for now since we made multiple medicine changes at this visit. Please discuss statin at next visit.

## 2022-06-19 ENCOUNTER — Other Ambulatory Visit: Payer: Self-pay

## 2022-06-20 ENCOUNTER — Ambulatory Visit: Payer: Medicaid Other | Attending: Obstetrics and Gynecology

## 2022-06-20 NOTE — Therapy (Incomplete)
OUTPATIENT PHYSICAL THERAPY FEMALE PELVIC EVALUATION   Patient Name: Dana Hughes MRN: [id number] DOB:04/07/1981, 42 y.o., female Today's Date: 06/20/2022  END OF SESSION:   Past Medical History:  Diagnosis Date   Anxiety    Arthritis    Asthma    Bronchitis    BV (bacterial vaginosis)    Candidiasis    Depression    Diabetes mellitus without complication ([place])    Endometriosis    Headache(784.0)    migraines   Hypertension    no longer takes meds   Migraines    Muscle spasm    Ovarian cyst    Pelvic pain in female 09/29/2015   Scoliosis    Uterine fibroid    Past Surgical History:  Procedure Laterality Date   BIOPSY  08/12/2020   Procedure: BIOPSY;  Surgeon: Mauri Pole, MD;  Location: [place];  Service: Endoscopy;;   CHOLECYSTECTOMY     COLONOSCOPY WITH PROPOFOL N/A 08/12/2020   Procedure: COLONOSCOPY WITH PROPOFOL;  Surgeon: Mauri Pole, MD;  Location: [place];  Service: Endoscopy;  Laterality: N/A;   ESOPHAGOGASTRODUODENOSCOPY (EGD) WITH PROPOFOL N/A 08/12/2020   Procedure: ESOPHAGOGASTRODUODENOSCOPY (EGD) WITH PROPOFOL;  Surgeon: Mauri Pole, MD;  Location: [place];  Service: Endoscopy;  Laterality: N/A;   POLYPECTOMY  08/12/2020   Procedure: POLYPECTOMY;  Surgeon: Mauri Pole, MD;  Location: [facility];  Service: Endoscopy;;   TUBAL LIGATION     Patient Active Problem List   Diagnosis Date Noted   Healthcare maintenance 06/12/2022   Acute back pain 06/12/2022   BPPV (benign paroxysmal positional vertigo) 05/18/2022   Chest pain of uncertain etiology [id number]   Irregular menses 07/03/2021   Uterine fibroid    Urinary incontinence    Cystocele without uterine prolapse    Exertional dyspnea 06/24/2021   Homicidal ideation 06/24/2021   Marijuana dependence ([place]) 04/13/2021   PTSD (post-traumatic stress disorder) 01/19/2021   Bipolar 2 disorder, major depressive episode ([place]) 01/11/2021   Constipation  12/10/2020   Complex ovarian cyst 12/10/2020   Atypical migraine 12/09/2020   Rectal bleeding 12/09/2020   Mild concentric left ventricular hypertrophy (LVH) 08/12/2020   History of colonic polyps 08/12/2020   Thrombocytosis 08/12/2020   History of pulmonary embolism 08/08/2020   Fibroids 05/05/2020   Iron deficiency anemia 04/08/2020   Severe episode of recurrent major depressive disorder, with psychotic features ([place]) 03/11/2020   Generalized anxiety disorder 03/11/2020   Diabetes mellitus, type 2 ([place]) 12/09/2019   Carpal tunnel syndrome, bilateral 01/16/2019   Hypertension 07/21/2018   Vulvovaginal candidiasis 05/15/2018   Endometriosis 08/30/2017    PCP: Sanjuan Dame, MD  REFERRING PROVIDER: Darliss Cheney, MD  REFERRING DIAG: R10.2,G89.29 (ICD-10-CM) - Chronic pelvic pain in female N94.10 (ICD-10-CM) - Female dyspareunia  THERAPY DIAG:  No diagnosis found.  Rationale for Evaluation and Treatment: Rehabilitation  ONSET DATE: ***  SUBJECTIVE:  SUBJECTIVE STATEMENT: *** Fluid intake: {Yes/No:304960894}   PAIN:  Are you having pain? {yes/no:20286} NPRS scale: ***/10 Pain location: {pelvic pain location:27098}  Pain type: {type:313116} Pain description: {PAIN DESCRIPTION:21022940}   Aggravating factors: *** Relieving factors: ***  PRECAUTIONS: None  WEIGHT BEARING RESTRICTIONS: No  FALLS:  Has patient fallen in last 6 months? No  LIVING ENVIRONMENT: Lives with: {[facility] lives with:25569::"lives with their family"} Lives in: {Lives in:25570}   OCCUPATION: ***  PLOF: Independent  PATIENT GOALS: ***  PERTINENT HISTORY:  Endometriosis, DMII, depression, anxiety, fibroids, PE, constipation, PTSD, cystocele/uterine prolapse, cholecystectomy, polypectomy, tubal  ligation  Sexual abuse: {Yes/No:304960894}  BOWEL MOVEMENT: Pain with bowel movement: {yes/no:20286} Type of bowel movement:{PT BM type:27100} Fully empty rectum: {Yes/No:304960894} Leakage: {Yes/No:304960894} Pads: {Yes/No:304960894} Fiber supplement: {Yes/No:304960894}  URINATION: Pain with urination: {yes/no:20286} Fully empty bladder: {Yes/No:304960894} Stream: {PT urination:27102} Urgency: Yes: *** Frequency: 1-2x/hour Leakage: {PT leakage:27103} Pads: {Yes/No:304960894}  INTERCOURSE: Pain with intercourse: {pain with intercourse PA:27099} Ability to have vaginal penetration:  {Yes/No:304960894} Climax: *** Marinoff Scale: ***/3  PREGNANCY: Vaginal deliveries *** Tearing {Yes***/No:304960894} C-section deliveries *** Currently pregnant {Yes***/No:304960894}  PROLAPSE: {PT prolapse:27101}   OBJECTIVE:  2/28/24L DIAGNOSTIC FINDINGS:  ***  PATIENT SURVEYS:   PFIQ-7 ***  COGNITION: Overall cognitive status: Within functional limits for tasks assessed     SENSATION: Light touch: Appears intact Proprioception: Appears intact  MUSCLE LENGTH:   FUNCTIONAL TESTS:    GAIT: Comments: ***  POSTURE: {posture:25561}    LUMBARAROM/PROM:  A/PROM A/PROM  eval  Flexion   Extension   Right lateral flexion   Left lateral flexion   Right rotation   Left rotation    (Blank rows = not tested)  LOWER EXTREMITY ROM:   LOWER EXTREMITY MMT:  MMT Right eval Left eval  Hip flexion    Hip extension    Hip abduction    Hip adduction    Hip internal rotation    Hip external rotation    Knee flexion    Knee extension    Ankle dorsiflexion    Ankle plantarflexion    Ankle inversion    Ankle eversion     PALPATION:   General  ***                External Perineal Exam ***                             Internal Pelvic Floor ***  Patient confirms identification and approves PT to assess internal pelvic floor and treatment {yes/no:20286}  PELVIC  MMT:   MMT eval  Vaginal   Internal Anal Sphincter   External Anal Sphincter   Puborectalis   Diastasis Recti   (Blank rows = not tested)        TONE: ***  PROLAPSE: ***  TODAY'S TREATMENT:  DATE: ***  EVAL ***   PATIENT EDUCATION:  Education details: *** Person educated: {Person educated:25204} Education method: {Education Method:25205} Education comprehension: {Education Comprehension:25206}  HOME EXERCISE PROGRAM: ***  ASSESSMENT:  CLINICAL IMPRESSION: Patient is a *** y.o. *** who was seen today for physical therapy evaluation and treatment for ***.   OBJECTIVE IMPAIRMENTS: {opptimpairments:25111}.   ACTIVITY LIMITATIONS: {activitylimitations:27494}  PARTICIPATION LIMITATIONS: {participationrestrictions:25113}  PERSONAL FACTORS: {Personal factors:25162} are also affecting patient's functional outcome.   REHAB POTENTIAL: {rehabpotential:25112}  CLINICAL DECISION MAKING: {clinical decision making:25114}  EVALUATION COMPLEXITY: {Evaluation complexity:25115}   GOALS: Goals reviewed with patient? {yes/no:20286}  SHORT TERM GOALS: Target date: 07/25/22  Pt will be independent with HEP.   Baseline: Goal status: INITIAL  2.  *** Baseline:  Goal status: {GOALSTATUS:25110}  3.  *** Baseline:  Goal status: {GOALSTATUS:25110}  4.  *** Baseline:  Goal status: {GOALSTATUS:25110}  5.  *** Baseline:  Goal status: {GOALSTATUS:25110}  6.  *** Baseline:  Goal status: {GOALSTATUS:25110}  LONG TERM GOALS: Target date: ***  Pt will be independent with advanced HEP.   Baseline:  Goal status: INITIAL  2.  *** Baseline:  Goal status: {GOALSTATUS:25110}  3.  *** Baseline:  Goal status: {GOALSTATUS:25110}  4.  *** Baseline:  Goal status: {GOALSTATUS:25110}  5.  *** Baseline:  Goal status: {GOALSTATUS:25110}  6.   *** Baseline:  Goal status: {GOALSTATUS:25110}  PLAN:  PT FREQUENCY: 1-2x/week  PT DURATION: 6 months  PLANNED INTERVENTIONS: Therapeutic exercises, Therapeutic activity, Neuromuscular re-education, Balance training, Gait training, Patient/Family education, Self Care, Joint mobilization, Dry Needling, Biofeedback, and Manual therapy  PLAN FOR NEXT SESSION: ***  Heather Roberts, PT, DPT02/28/2410:43 AM

## 2022-06-27 ENCOUNTER — Other Ambulatory Visit (HOSPITAL_BASED_OUTPATIENT_CLINIC_OR_DEPARTMENT_OTHER): Payer: Self-pay

## 2022-06-27 ENCOUNTER — Telehealth (INDEPENDENT_AMBULATORY_CARE_PROVIDER_SITE_OTHER): Payer: Medicaid Other | Admitting: Psychiatry

## 2022-06-27 ENCOUNTER — Other Ambulatory Visit: Payer: Self-pay

## 2022-06-27 DIAGNOSIS — F3181 Bipolar II disorder: Secondary | ICD-10-CM | POA: Diagnosis not present

## 2022-06-27 DIAGNOSIS — F411 Generalized anxiety disorder: Secondary | ICD-10-CM | POA: Diagnosis not present

## 2022-06-27 MED ORDER — ARIPIPRAZOLE 20 MG PO TABS
20.0000 mg | ORAL_TABLET | Freq: Every day | ORAL | 3 refills | Status: AC
  Filled 2022-08-08 – 2023-05-31 (×2): qty 30, 30d supply, fill #0

## 2022-06-27 MED ORDER — LAMOTRIGINE 150 MG PO TABS
150.0000 mg | ORAL_TABLET | Freq: Every day | ORAL | 3 refills | Status: AC
  Filled 2022-08-08 – 2023-05-31 (×2): qty 30, 30d supply, fill #0

## 2022-06-27 MED ORDER — GABAPENTIN 300 MG PO CAPS
300.0000 mg | ORAL_CAPSULE | Freq: Three times a day (TID) | ORAL | 3 refills | Status: AC
  Filled 2022-08-08 – 2023-05-31 (×2): qty 90, 30d supply, fill #0

## 2022-06-27 MED ORDER — MIRTAZAPINE 45 MG PO TABS
45.0000 mg | ORAL_TABLET | Freq: Every day | ORAL | 3 refills | Status: AC
  Filled 2022-08-08 – 2023-05-31 (×2): qty 30, 30d supply, fill #0

## 2022-06-27 MED ORDER — TASIMELTEON 20 MG PO CAPS: 20.0000 mg | ORAL_CAPSULE | Freq: Every evening | ORAL | 3 refills | Status: AC

## 2022-06-27 NOTE — Progress Notes (Signed)
BH MD/PA/NP OP Progress Note Virtual Visit via Video Note  I connected with AZALEIA ARRELLANO on 06/27/22 at  2:30 PM EST by a video enabled telemedicine application and verified that I am speaking with the correct person using two identifiers.  Location: Patient: Dana Hughes (Job Interview) Provider: Clinic   I discussed the limitations of evaluation and management by telemedicine and the availability of in person appointments. The patient expressed understanding and agreed to proceed.  I provided 30 minutes of non-face-to-face time during this encounter.      06/27/2022 3:07 PM Kamali QUENTINA PERINA  MRN:  [id number]  Chief Complaint:  "The medication make me calm"   HPI: 42 year old female seen today for follow up psychiatric evaluation. She has a psychiatric history of depression, anxiety, and adjustment disorder.  She is currently managed on Mirtazapine 45 mg nightly, gabapentin 300 mg three times daily, Abilify 20 mg daily,  Lamictal 150 mg daily.   She inform her that her  medications are effective in managing her psychiatric conditions.    Today she was was pleasant, well groomed, cooperative, engaged in conversation, and maintained eye contact. She informed Probation officer that her medications make her calm. She notes that her irritability and mood swings have improved. She does note that she continues to have AH of people calling her name but reports that she is able to cope with it.    Patient notes that her anxiety and depression has improved since her last visit. She reports that she is unable to do a GAD 7 or a PHQ 9 because she was at a job interview (Dana Hughes).  Today she denies SI/HI mania or paranoia. She endorses poor sleep noting that she sleeps 2-3 hours nightly.  Patient reports that she has continuous back pain due to scoliosis. She is followed by a PCP for pain management.   At this time patient notes that she can cope with the her AH. Today she is agreeable to starting Hetlioz  20 mg nightly to help manage sleep.  Patient agreeable to continue medications all other medications as prescribed. She will follow up with outpatient counseling for therapy. No other concerns noted at this time.     Visit Diagnosis:    ICD-10-CM   1. Bipolar 2 disorder, major depressive episode (HCC)  F31.81 ARIPiprazole (ABILIFY) 20 MG tablet    lamoTRIgine (LAMICTAL) 150 MG tablet    mirtazapine (REMERON) 45 MG tablet    gabapentin (NEURONTIN) 300 MG capsule    Tasimelteon (HETLIOZ) 20 MG CAPS    2. Generalized anxiety disorder  F41.1 mirtazapine (REMERON) 45 MG tablet    gabapentin (NEURONTIN) 300 MG capsule      Past Psychiatric History: depression, anxiety, and adjustment disorder.  Past Medical History:  Past Medical History:  Diagnosis Date   Anxiety    Arthritis    Asthma    Bronchitis    BV (bacterial vaginosis)    Candidiasis    Depression    Diabetes mellitus without complication ([place])    Endometriosis    Headache(784.0)    migraines   Hypertension    no longer takes meds   Migraines    Muscle spasm    Ovarian cyst    Pelvic pain in female 09/29/2015   Scoliosis    Uterine fibroid     Past Surgical History:  Procedure Laterality Date   BIOPSY  08/12/2020   Procedure: BIOPSY;  Surgeon: Mauri Pole, MD;  Location: [facility];  Service: Endoscopy;;  CHOLECYSTECTOMY     COLONOSCOPY WITH PROPOFOL N/A 08/12/2020   Procedure: COLONOSCOPY WITH PROPOFOL;  Surgeon: Mauri Pole, MD;  Location: [facility];  Service: Endoscopy;  Laterality: N/A;   ESOPHAGOGASTRODUODENOSCOPY (EGD) WITH PROPOFOL N/A 08/12/2020   Procedure: ESOPHAGOGASTRODUODENOSCOPY (EGD) WITH PROPOFOL;  Surgeon: Mauri Pole, MD;  Location: [facility];  Service: Endoscopy;  Laterality: N/A;   POLYPECTOMY  08/12/2020   Procedure: POLYPECTOMY;  Surgeon: Mauri Pole, MD;  Location: [facility];  Service: Endoscopy;;   TUBAL LIGATION      Family Psychiatric History:  Alcohol use disorder paternal and maternal family members, siblings anxiety,   Family History:  Family History  Problem Relation Age of Onset   Migraines Mother    Cancer Father    Colon cancer Father    Lupus Paternal Grandmother    Cancer Paternal Grandmother    Breast cancer Paternal Grandmother    Migraines Maternal Aunt    Hypertension Maternal Aunt    Breast cancer Maternal Aunt    Colon cancer Paternal Aunt    Breast cancer Paternal Aunt    Ovarian cancer Cousin     Social History:  Social History   Socioeconomic History   Marital status: Single    Spouse name: Not on file   Number of children: Not on file   Years of education: Not on file   Highest education level: Not on file  Occupational History   Not on file  Tobacco Use   Smoking status: Former    Types: Cigars   Smokeless tobacco: Never   Tobacco comments:    1-2 cigars/week  Vaping Use   Vaping Use: Never used  Substance and Sexual Activity   Alcohol use: Not Currently    Comment: occasional   Drug use: Yes    Frequency: 7.0 times per week    Types: Marijuana    Comment: 1  blunts a week, last use last night   Sexual activity: Yes    Birth control/protection: Condom, Surgical  Other Topics Concern   Not on file  Social History Narrative   Not on file   Social Determinants of Health   Financial Resource Strain: Medium Risk (11/08/2021)   Overall Financial Resource Strain (CARDIA)    Difficulty of Paying Living Expenses: Somewhat hard  Food Insecurity: Food Insecurity Present (06/12/2022)   Hunger Vital Sign    Worried About Running Out of Food in the Last Year: Sometimes true    Ran Out of Food in the Last Year: Sometimes true  Transportation Needs: No Transportation Needs (06/12/2022)   PRAPARE - Hydrologist (Medical): No    Lack of Transportation (Non-Medical): No  Physical Activity: Not on file  Stress: Not on file  Social Connections: Not on file     Allergies:  Allergies  Allergen Reactions   Mushroom Extract Complex Anaphylaxis   Latex Itching and Swelling   Penicillins Itching    Has patient had a PCN reaction causing immediate rash, facial/tongue/throat swelling, SOB or lightheadedness with hypotension: No Has patient had a PCN reaction causing severe rash involving mucus membranes or skin necrosis: No Has patient had a PCN reaction that required hospitalization No Has patient had a PCN reaction occurring within the last 10 years: No If all of the above answers are "NO", then may proceed with Cephalosporin use.     Metabolic Disorder Labs: Lab Results  Component Value Date   HGBA1C 12.6 (A) 06/12/2022  MPG 151.33 06/22/2021   MPG 271.87 11/12/2019   No results found for: "PROLACTIN" Lab Results  Component Value Date   CHOL 152 06/12/2022   TRIG 216 (H) 06/12/2022   HDL 40 06/12/2022   CHOLHDL 3.8 06/12/2022   LDLCALC 76 06/12/2022   LDLCALC 80 10/04/2021   Lab Results  Component Value Date   TSH 1.279 05/29/2017    Therapeutic Level Labs: No results found for: "LITHIUM" No results found for: "VALPROATE" No results found for: "CBMZ"  Current Medications: Current Outpatient Medications  Medication Sig Dispense Refill   Tasimelteon (HETLIOZ) 20 MG CAPS Take 20 mg by mouth at bedtime. 30 capsule 3   acetaminophen (TYLENOL) 500 MG tablet Take 2 tablets (1,000 mg total) by mouth every 6 (six) hours as needed for mild pain (or Fever >/= 101). 30 tablet 0   albuterol (PROVENTIL) (2.5 MG/3ML) 0.083% nebulizer solution Take 3 mLs (2.5 mg total) by nebulization every 6 (six) hours as needed for wheezing or shortness of breath. 75 mL 0   albuterol (VENTOLIN HFA) 108 (90 Base) MCG/ACT inhaler Inhale 2 puffs into the lungs every 6 (six) hours as needed for wheezing or shortness of breath. Reported on 09/29/2015     AMBULATORY NON FORMULARY MEDICATION Nitroglycerin 0.125% gel - apply a pea size amount to your rectum  three times daily x 6-8 weeks. 45 g 1   amLODipine (NORVASC) 5 MG tablet Take 1 tablet (5 mg total) by mouth daily. 30 tablet 11   ARIPiprazole (ABILIFY) 20 MG tablet Take 1 tablet (20 mg total) by mouth daily. 30 tablet 3   artificial tears (LACRILUBE) OINT ophthalmic ointment Place into both eyes at bedtime as needed for dry eyes. 3.5 g 0   bisacodyl (DULCOLAX) 5 MG EC tablet Take 1 tablet (5 mg total) by mouth daily as needed for moderate constipation. 15 tablet 0   Blood Glucose Monitoring Suppl (TRUE METRIX METER) w/Device KIT use as directed 1 kit 0   diclofenac Sodium (VOLTAREN) 1 % GEL Apply 4 g topically 4 (four) times daily. 100 g 0   dicyclomine (BENTYL) 10 MG capsule Take 1 capsule (10 mg total) by mouth 4 (four) times daily as needed for spasms (abdominal pain). 30 capsule 1   Dulaglutide (TRULICITY) [id number] 0000000 SOPN Inject 0.75 mg into the skin once a week. 2 mL 0   fluticasone (FLONASE) 50 MCG/ACT nasal spray PLACE 1 SPRAY INTO BOTH NOSTRILS IN THE MORNING AND AT BEDTIME. (Patient taking differently: 2 sprays.) 16 g 1   gabapentin (NEURONTIN) 300 MG capsule Take 1 capsule (300 mg total) by mouth 3 (three) times daily. 90 capsule 3   glucose blood test strip Check your sugar in the morning before you eat breakfast, and one hour after a meal. 100 each 2   ibuprofen (ADVIL) 600 MG tablet Take 1 tablet (600 mg total) by mouth 3 (three) times daily. 30 tablet 0   insulin aspart (NOVOLOG) 100 UNIT/ML FlexPen Inject 10 Units into the skin 2 (two) times daily with a meal. 6 mL 0   insulin glargine (LANTUS) 100 UNIT/ML Solostar Pen Inject 20 Units into the skin daily at 10pm. 6 mL 0   Insulin Pen Needle 32G X 4 MM MISC Use as directed 4 times daily. 200 each 0   lamoTRIgine (LAMICTAL) 150 MG tablet Take 1 tablet (150 mg total) by mouth daily. 30 tablet 3   lidocaine (LIDODERM) 5 % Place 1 patch onto the skin daily. Remove &  Discard patch within 12 hours or as directed by MD 30 patch 0    meclizine (ANTIVERT) 25 MG tablet Take 1 tablet (25 mg total) by mouth 3 (three) times daily as needed for dizziness. 30 tablet 0   metFORMIN (GLUCOPHAGE) 1000 MG tablet Take 1 tablet (1,000 mg total) by mouth 2 (two) times daily with a meal. 90 tablet 3   metoprolol tartrate (LOPRESSOR) 100 MG tablet TAKE 1 TABLET BY MOUTH 2 HOURS BEFORE MRI. 1 tablet 0   metroNIDAZOLE (METROGEL) 0.75 % vaginal gel Place 1 Applicatorful vaginally at bedtime for 5 days. 70 g 1   mirtazapine (REMERON) 45 MG tablet Take 1 tablet (45 mg total) by mouth at bedtime. 30 tablet 3   omeprazole (PRILOSEC) 40 MG capsule Take 1 capsule (40 mg total) by mouth daily. 30 capsule 1   polyethylene glycol powder (GLYCOLAX/MIRALAX) 17 GM/SCOOP powder Take 127.5 g by mouth daily. 119 g 0   senna-docusate (SENOKOT S) 8.6-50 MG tablet Take 1 tablet by mouth daily. 90 tablet 2   SUMAtriptan (IMITREX) 50 MG tablet Take 1 tablet (50 mg total) by mouth every 2 (two) hours as needed for migraine. May repeat in 2 hours if headache persists or recurs. Please take no more than 3 tablets (150 mg) in 24 hours. 15 tablet 0   TRUEplus Lancets 28G MISC USE AS DIRECTED 100 each 0   No current facility-administered medications for this visit.     Musculoskeletal: Strength & Muscle Tone: within normal limits and telehealth visit Gait & Station: normal, telehealth visit Patient leans: N/A  Psychiatric Specialty Exam: Review of Systems  There were no vitals taken for this visit.There is no height or weight on file to calculate BMI.  General Appearance: Well Groomed  Eye Contact:  Good  Speech:  Clear and Coherent and Normal Rate  Volume:  Normal  Mood:  Euthymic  Affect:  Congruent  Thought Process:  Coherent, Goal Directed and Linear  Orientation:  Full (Time, Place, and Person)  Thought Content: Logical and Hallucinations: Auditory   Suicidal Thoughts:  No  Homicidal Thoughts:  No  Memory:  Immediate;   Good Recent;   Good Remote;    Good  Judgement:  Good  Insight:  Good  Psychomotor Activity:  Normal  Concentration:  Concentration: Good and Attention Span: Good  Recall:  Good  Fund of Knowledge: Good  Language: Good  Akathisia:  No  Handed:  Right  AIMS (if indicated): Not done  Assets:  Communication Skills Desire for Improvement Financial Resources/Insurance Housing Intimacy Social Support  ADL's:  Intact  Cognition: WNL  Sleep:  Good   Screenings: AIMS    Flowsheet Row Video Visit from 01/01/2022 in [place] Total Score 0      GAD-7    Flowsheet Row Video Visit from 04/05/2022 in [facility] Office Visit from 02/28/2022 in Center for [place] at [facility] Video Visit from 01/01/2022 in [facility] Video Visit from 09/28/2021 in [facility] Office Visit from 07/03/2021 in [place] for [place] [facility]  Total GAD-7 Score '19 19 20 18 21      '$ PHQ2-9    [place] Office Visit from 05/18/2022 in [place] Video Visit from 04/05/2022 in [facility] Office Visit from 04/03/2022 in [place] Office Visit  from 02/28/2022 in Center for [place] at [facility] Video Visit from 01/01/2022 in [facility]  PHQ-2 Total Score '1 5 5 1 5  '$ PHQ-9 Total Score '5 17 21 11 17      '$ [place] ED from 05/01/2022 in [facility] Video Visit from 05/16/2021 in [facility] Video Visit from 04/13/2021 in [place] No Risk Error: Question 6 not populated Error: Q7 should not be populated when Q6 is No        Assessment and Plan: Patient endorses AH and poor sleep. At this time  patient notes that she can cope with the her AH. Today she is agreeable to starting Hetlioz 20 mg nightly to help manage sleep. She will continue all other medications as prescribed.    1. Bipolar 2 disorder, major depressive episode (HCC)  Continue- ARIPiprazole (ABILIFY) 20 MG tablet; Take 1 tablet (20 mg total) by mouth daily.  Dispense: 30 tablet; Refill: 3 Continue- lamoTRIgine (LAMICTAL) 150 MG tablet; Take 1 tablet (150 mg total) by mouth daily.  Dispense: 30 tablet; Refill: 3 Continue- mirtazapine (REMERON) 45 MG tablet; Take 1 tablet (45 mg total) by mouth at bedtime.  Dispense: 30 tablet; Refill: 3 Continue- gabapentin (NEURONTIN) 300 MG capsule; Take 1 capsule (300 mg total) by mouth 3 (three) times daily.  Dispense: 90 capsule; Refill: 3 Start- Tasimelteon (HETLIOZ) 20 MG CAPS; Take 20 mg by mouth at bedtime.  Dispense: 30 capsule; Refill: 3  2. Generalized anxiety disorder  Continue- mirtazapine (REMERON) 45 MG tablet; Take 1 tablet (45 mg total) by mouth at bedtime.  Dispense: 30 tablet; Refill: 3  Continue- gabapentin (NEURONTIN) 300 MG capsule; Take 1 capsule (300 mg total) by mouth 3 (three) times daily.  Dispense: 90 capsule; Refill: 3    Follow up in 3 months Follow up with therapy  Salley Slaughter, NP 06/27/2022, 3:07 PM

## 2022-07-02 ENCOUNTER — Other Ambulatory Visit (HOSPITAL_COMMUNITY): Payer: Self-pay

## 2022-07-10 ENCOUNTER — Other Ambulatory Visit: Payer: Self-pay | Admitting: Obstetrics and Gynecology

## 2022-07-10 DIAGNOSIS — Z1231 Encounter for screening mammogram for malignant neoplasm of breast: Secondary | ICD-10-CM

## 2022-07-13 ENCOUNTER — Telehealth (HOSPITAL_COMMUNITY): Payer: Self-pay | Admitting: Psychiatry

## 2022-07-13 NOTE — Telephone Encounter (Signed)
This patient called in and is needing to speak to you regarding paperwork she needs you to fill out for her disability.

## 2022-07-20 NOTE — Telephone Encounter (Signed)
Provider called patient however did not get a response.  Provider left a message informing patient that disability paperwork had not been received.  Provider instructed patient to email or drop paperwork off in the clinic.  She can also Microbiologist for further questions or concerns as needed.

## 2022-07-24 ENCOUNTER — Encounter: Payer: Medicaid Other | Admitting: Gastroenterology

## 2022-08-01 ENCOUNTER — Telehealth: Payer: Self-pay | Admitting: Gastroenterology

## 2022-08-01 ENCOUNTER — Encounter: Payer: Medicaid Other | Admitting: Gastroenterology

## 2022-08-01 NOTE — Telephone Encounter (Signed)
Patient called stating she needs to reschedule colonoscopy today due to medication making her sick. She is wondering if there is another medication that she can take for prep before procedure. Patient reschedule for 09/03/2022.

## 2022-08-01 NOTE — Telephone Encounter (Signed)
Pt states that she had vomiting with Miralax prep and suprep.  Would like to try pills next time.  She was rescheduled for May 13th and will have a previsit April 22nd.

## 2022-08-06 ENCOUNTER — Telehealth: Payer: Self-pay | Admitting: *Deleted

## 2022-08-06 ENCOUNTER — Encounter: Payer: Self-pay | Admitting: *Deleted

## 2022-08-06 NOTE — Telephone Encounter (Signed)
I also attempted to call her and it did not go through. Advise her to go to the ER if the chest pain reoccurs. She can also schedule a follow up visit with [place].

## 2022-08-06 NOTE — Telephone Encounter (Signed)
Call placed to patient regarding below MyChart message. Recording states, "Call cannot be completed at this time."  Will send MyChart message back to patient.

## 2022-08-06 NOTE — Telephone Encounter (Signed)
Forwarded fron [employer]:  Appointment Request From: Dana Hughes   With Provider: Lajuana Ripple, MD [facility]]   Preferred Date Range: 08/10/2022 - 08/15/2022   Preferred Times: Any Time   Reason for visit: Request an Appointment   Comments: Starting to have them sharp pains in middle of my chest it comes and goes then sharp pains in the left side of my chest there is a bulge an it's painful on my left rib cage area two days straight my right kidney area was in sharp pain for I couldn't lay down to go to bed I took a 600 ibuprofen I got some sleep but the pain woke me up

## 2022-08-08 ENCOUNTER — Other Ambulatory Visit: Payer: Self-pay | Admitting: Student

## 2022-08-08 ENCOUNTER — Other Ambulatory Visit: Payer: Self-pay

## 2022-08-08 DIAGNOSIS — E119 Type 2 diabetes mellitus without complications: Secondary | ICD-10-CM

## 2022-08-09 ENCOUNTER — Telehealth: Payer: Self-pay

## 2022-08-09 ENCOUNTER — Other Ambulatory Visit (HOSPITAL_COMMUNITY): Payer: Self-pay

## 2022-08-09 NOTE — Telephone Encounter (Signed)
A Prior Authorization was initiated for this patients LIDOCAINE PATCHES through [facility]    CONFIRMATION #[id number] W

## 2022-08-11 ENCOUNTER — Other Ambulatory Visit: Payer: Self-pay

## 2022-08-11 ENCOUNTER — Inpatient Hospital Stay (HOSPITAL_COMMUNITY)
Admission: EM | Admit: 2022-08-11 | Discharge: 2022-08-16 | DRG: 175 | Disposition: A | Discharge status: Home / Self Care | Payer: Medicaid Other | Attending: Student in an Organized Health Care Education/Training Program | Admitting: Student in an Organized Health Care Education/Training Program

## 2022-08-11 ENCOUNTER — Emergency Department (HOSPITAL_COMMUNITY): Payer: Medicaid Other

## 2022-08-11 DIAGNOSIS — F1290 Cannabis use, unspecified, uncomplicated: Secondary | ICD-10-CM | POA: Diagnosis present

## 2022-08-11 DIAGNOSIS — Z880 Allergy status to penicillin: Secondary | ICD-10-CM

## 2022-08-11 DIAGNOSIS — Z1152 Encounter for screening for COVID-19: Secondary | ICD-10-CM

## 2022-08-11 DIAGNOSIS — Z79899 Other long term (current) drug therapy: Secondary | ICD-10-CM

## 2022-08-11 DIAGNOSIS — Z9049 Acquired absence of other specified parts of digestive tract: Secondary | ICD-10-CM

## 2022-08-11 DIAGNOSIS — Z91040 Latex allergy status: Secondary | ICD-10-CM

## 2022-08-11 DIAGNOSIS — I2721 Secondary pulmonary arterial hypertension: Secondary | ICD-10-CM | POA: Diagnosis present

## 2022-08-11 DIAGNOSIS — Z5986 Financial insecurity: Secondary | ICD-10-CM

## 2022-08-11 DIAGNOSIS — Z5941 Food insecurity: Secondary | ICD-10-CM

## 2022-08-11 DIAGNOSIS — Z7984 Long term (current) use of oral hypoglycemic drugs: Secondary | ICD-10-CM

## 2022-08-11 DIAGNOSIS — F3181 Bipolar II disorder: Secondary | ICD-10-CM | POA: Diagnosis present

## 2022-08-11 DIAGNOSIS — Z87892 Personal history of anaphylaxis: Secondary | ICD-10-CM

## 2022-08-11 DIAGNOSIS — F419 Anxiety disorder, unspecified: Secondary | ICD-10-CM | POA: Diagnosis present

## 2022-08-11 DIAGNOSIS — D75839 Thrombocytosis, unspecified: Secondary | ICD-10-CM | POA: Diagnosis present

## 2022-08-11 DIAGNOSIS — J45901 Unspecified asthma with (acute) exacerbation: Secondary | ICD-10-CM | POA: Diagnosis present

## 2022-08-11 DIAGNOSIS — N926 Irregular menstruation, unspecified: Secondary | ICD-10-CM | POA: Diagnosis present

## 2022-08-11 DIAGNOSIS — Z86711 Personal history of pulmonary embolism: Secondary | ICD-10-CM | POA: Diagnosis present

## 2022-08-11 DIAGNOSIS — Z8249 Family history of ischemic heart disease and other diseases of the circulatory system: Secondary | ICD-10-CM

## 2022-08-11 DIAGNOSIS — F1490 Cocaine use, unspecified, uncomplicated: Secondary | ICD-10-CM | POA: Diagnosis present

## 2022-08-11 DIAGNOSIS — Z86010 Personal history of colonic polyps: Secondary | ICD-10-CM

## 2022-08-11 DIAGNOSIS — I10 Essential (primary) hypertension: Secondary | ICD-10-CM | POA: Diagnosis present

## 2022-08-11 DIAGNOSIS — Z8742 Personal history of other diseases of the female genital tract: Secondary | ICD-10-CM

## 2022-08-11 DIAGNOSIS — F209 Schizophrenia, unspecified: Secondary | ICD-10-CM | POA: Diagnosis present

## 2022-08-11 DIAGNOSIS — K529 Noninfective gastroenteritis and colitis, unspecified: Secondary | ICD-10-CM | POA: Diagnosis not present

## 2022-08-11 DIAGNOSIS — Z560 Unemployment, unspecified: Secondary | ICD-10-CM

## 2022-08-11 DIAGNOSIS — Z7985 Long-term (current) use of injectable non-insulin antidiabetic drugs: Secondary | ICD-10-CM

## 2022-08-11 DIAGNOSIS — Z9109 Other allergy status, other than to drugs and biological substances: Secondary | ICD-10-CM

## 2022-08-11 DIAGNOSIS — Z8041 Family history of malignant neoplasm of ovary: Secondary | ICD-10-CM

## 2022-08-11 DIAGNOSIS — Z800 Family history of malignant neoplasm of digestive organs: Secondary | ICD-10-CM

## 2022-08-11 DIAGNOSIS — R000 Tachycardia, unspecified: Secondary | ICD-10-CM | POA: Diagnosis present

## 2022-08-11 DIAGNOSIS — D509 Iron deficiency anemia, unspecified: Secondary | ICD-10-CM | POA: Diagnosis present

## 2022-08-11 DIAGNOSIS — N809 Endometriosis, unspecified: Secondary | ICD-10-CM | POA: Diagnosis present

## 2022-08-11 DIAGNOSIS — Z7901 Long term (current) use of anticoagulants: Secondary | ICD-10-CM

## 2022-08-11 DIAGNOSIS — Z86718 Personal history of other venous thrombosis and embolism: Secondary | ICD-10-CM

## 2022-08-11 DIAGNOSIS — Z803 Family history of malignant neoplasm of breast: Secondary | ICD-10-CM

## 2022-08-11 DIAGNOSIS — I2699 Other pulmonary embolism without acute cor pulmonale: Secondary | ICD-10-CM | POA: Diagnosis present

## 2022-08-11 DIAGNOSIS — Z87891 Personal history of nicotine dependence: Secondary | ICD-10-CM

## 2022-08-11 DIAGNOSIS — I2782 Chronic pulmonary embolism: Secondary | ICD-10-CM | POA: Diagnosis present

## 2022-08-11 DIAGNOSIS — E876 Hypokalemia: Secondary | ICD-10-CM | POA: Diagnosis present

## 2022-08-11 DIAGNOSIS — Z7951 Long term (current) use of inhaled steroids: Secondary | ICD-10-CM

## 2022-08-11 DIAGNOSIS — J9601 Acute respiratory failure with hypoxia: Secondary | ICD-10-CM | POA: Diagnosis not present

## 2022-08-11 DIAGNOSIS — E785 Hyperlipidemia, unspecified: Secondary | ICD-10-CM | POA: Diagnosis present

## 2022-08-11 DIAGNOSIS — I2609 Other pulmonary embolism with acute cor pulmonale: Principal | ICD-10-CM | POA: Diagnosis present

## 2022-08-11 DIAGNOSIS — Z794 Long term (current) use of insulin: Secondary | ICD-10-CM

## 2022-08-11 DIAGNOSIS — E119 Type 2 diabetes mellitus without complications: Secondary | ICD-10-CM | POA: Diagnosis present

## 2022-08-11 LAB — COMPREHENSIVE METABOLIC PANEL
ALT: 30 U/L (ref 0–44)
AST: 23 U/L (ref 15–41)
Albumin: 3.1 g/dL — ABNORMAL LOW (ref 3.5–5.0)
Alkaline Phosphatase: 59 U/L (ref 38–126)
Anion gap: 12 (ref 5–15)
BUN: 6 mg/dL (ref 6–20)
CO2: 18 mmol/L — ABNORMAL LOW (ref 22–32)
Calcium: 8.7 mg/dL — ABNORMAL LOW (ref 8.9–10.3)
Chloride: 106 mmol/L (ref 98–111)
Creatinine, Ser: 0.78 mg/dL (ref 0.44–1.00)
GFR, Estimated: >60 mL/min (ref >60)
Glucose, Bld: 256 mg/dL — ABNORMAL HIGH (ref 70–99)
Potassium: 2.9 mmol/L — ABNORMAL LOW (ref 3.5–5.1)
Sodium: 136 mmol/L (ref 135–145)
Total Bilirubin: 0.4 mg/dL (ref 0.3–1.2)
Total Protein: 7.2 g/dL (ref 6.5–8.1)

## 2022-08-11 LAB — CBC WITH DIFFERENTIAL/PLATELET
Abs Immature Granulocytes: 0.05 10*3/uL (ref 0.00–0.07)
Basophils Absolute: 0 10*3/uL (ref 0.0–0.1)
Basophils Relative: 0 %
Eosinophils Absolute: 0.1 10*3/uL (ref 0.0–0.5)
Eosinophils Relative: 1 %
HCT: 33.8 % — ABNORMAL LOW (ref 36.0–46.0)
Hemoglobin: 10.1 g/dL — ABNORMAL LOW (ref 12.0–15.0)
Immature Granulocytes: 0 %
Lymphocytes Relative: 28 %
Lymphs Abs: 3.3 10*3/uL (ref 0.7–4.0)
MCH: 22.1 pg — ABNORMAL LOW (ref 26.0–34.0)
MCHC: 29.9 g/dL — ABNORMAL LOW (ref 30.0–36.0)
MCV: 74 fL — ABNORMAL LOW (ref 80.0–100.0)
Monocytes Absolute: 0.5 10*3/uL (ref 0.1–1.0)
Monocytes Relative: 5 %
Neutro Abs: 7.7 10*3/uL (ref 1.7–7.7)
Neutrophils Relative %: 66 %
Platelets: 334 10*3/uL (ref 150–400)
RBC: 4.57 MIL/uL (ref 3.87–5.11)
RDW: 17.3 % — ABNORMAL HIGH (ref 11.5–15.5)
WBC: 11.7 10*3/uL — ABNORMAL HIGH (ref 4.0–10.5)
nRBC: 0 % (ref 0.0–0.2)

## 2022-08-11 LAB — TROPONIN I (HIGH SENSITIVITY): Troponin I (High Sensitivity): 31 ng/L — ABNORMAL HIGH (ref <18)

## 2022-08-11 LAB — D-DIMER, QUANTITATIVE: D-Dimer, Quant: 2.21 ug/mL-FEU — ABNORMAL HIGH (ref 0.00–0.50)

## 2022-08-11 LAB — I-STAT BETA HCG BLOOD, ED (MC, WL, AP ONLY): I-stat hCG, quantitative: <5 m[IU]/mL (ref <5)

## 2022-08-11 MED ORDER — POTASSIUM CHLORIDE CRYS ER 20 MEQ PO TBCR
40.0000 meq | EXTENDED_RELEASE_TABLET | Freq: Once | ORAL | Status: AC
  Administered 2022-08-12: 40 meq via ORAL
  Filled 2022-08-11: qty 2

## 2022-08-11 MED ORDER — ASPIRIN 81 MG PO CHEW
324.0000 mg | CHEWABLE_TABLET | Freq: Once | ORAL | Status: AC
  Administered 2022-08-12: 324 mg via ORAL
  Filled 2022-08-11: qty 4

## 2022-08-11 NOTE — ED Triage Notes (Signed)
Patient reports worsening SOB with productive cough /chest congestion for several days . CBG= 306 .

## 2022-08-11 NOTE — ED Provider Notes (Signed)
[facility] Emergency Department Provider Note MRN:  [id number]  Arrival date & time: 08/12/22     Chief Complaint   Shortness of Breath   History of Present Illness   Dana Hughes is a 42 y.o. year-old female presents to the ED with chief complaint of asthma exacerbation.  States that she has had several attacks this week.  States that now she is having productive cough.  Has dyspnea with exertion.  Has been using albuterol and a nebulizer.  States that her right foot turned purple for about 30 minutes yesterday, resolved now.  History provided by patient.   Review of Systems  Pertinent positive and negative review of systems noted in HPI.    Physical Exam   Vitals:   08/11/22 2200 08/12/22 0032  BP: 107/82 112/85  Pulse: (!) 123 (!) 108  Resp: (!) 22 (!) 23  Temp: 98.2 F (36.8 C)   SpO2: 100% 100%    CONSTITUTIONAL:  winded-appearing, NAD NEURO:  Alert and oriented x 3, CN 3-12 grossly intact EYES:  eyes equal and reactive ENT/NECK:  Supple, no stridor  CARDIO:  tachycardic, regular rhythm, appears well-perfused  PULM:  Lungs are clear, but increased work of breathing, speaking in 3-5 word phrases GI/GU:  non-distended,  MSK/SPINE:  No gross deformities, no edema, moves all extremities  SKIN:  no rash, atraumatic   *Additional and/or pertinent findings included in MDM below  Diagnostic and Interventional Summary    EKG Interpretation  Date/Time:    Ventricular Rate:    PR Interval:    QRS Duration:   QT Interval:    QTC Calculation:   R Axis:     Text Interpretation:         Labs Reviewed  CBC WITH DIFFERENTIAL/PLATELET - Abnormal; Notable for the following components:      Result Value   WBC 11.7 (*)    Hemoglobin 10.1 (*)    HCT 33.8 (*)    MCV 74.0 (*)    MCH 22.1 (*)    MCHC 29.9 (*)    RDW 17.3 (*)    All other components within normal limits  COMPREHENSIVE METABOLIC PANEL - Abnormal; Notable for the following  components:   Potassium 2.9 (*)    CO2 18 (*)    Glucose, Bld 256 (*)    Calcium 8.7 (*)    Albumin 3.1 (*)    All other components within normal limits  D-DIMER, QUANTITATIVE - Abnormal; Notable for the following components:   D-Dimer, Quant 2.21 (*)    All other components within normal limits  BRAIN NATRIURETIC PEPTIDE - Abnormal; Notable for the following components:   B Natriuretic Peptide 127.1 (*)    All other components within normal limits  TROPONIN I (HIGH SENSITIVITY) - Abnormal; Notable for the following components:   Troponin I (High Sensitivity) 31 (*)    All other components within normal limits  RESP PANEL BY RT-PCR (RSV, FLU A&B, COVID)  [id number]  LACTIC ACID, PLASMA  LACTIC ACID, PLASMA  I-STAT BETA HCG BLOOD, ED ([facility], [facility], [facility] ONLY)  TROPONIN I (HIGH SENSITIVITY)    CT Angio Chest PE W and/or Wo Contrast  Final Result    DG Chest 2 View  Final Result      Medications  potassium chloride SA (KLOR-CON M) CR tablet 40 mEq (40 mEq Oral Given 08/12/22 0032)  aspirin chewable tablet 324 mg (324 mg Oral Given 08/12/22 0033)  iohexol (OMNIPAQUE) 350 MG/ML  injection 75 mL (75 mLs Intravenous Contrast Given 08/12/22 0008)     Procedures  /  Critical Care .Critical Care  Performed by: Roxy Horseman, PA-C Authorized by: Roxy Horseman, PA-C   Critical care provider statement:    Critical care time (minutes):  57   Critical care was necessary to treat or prevent imminent or life-threatening deterioration of the following conditions:  Respiratory failure   Critical care was time spent personally by me on the following activities:  Development of treatment plan with patient or surrogate, discussions with consultants, evaluation of patient's response to treatment, examination of patient, ordering and review of laboratory studies, ordering and review of radiographic studies, ordering and performing treatments and interventions, pulse oximetry, re-evaluation of patient's  condition and review of old charts   ED Course and Medical Decision Making  I have reviewed the triage vital signs, the nursing notes, and pertinent available records from the EMR.  Social Determinants Affecting Complexity of Care: Patient has no clinically significant social determinants affecting this chief complaint..   ED Course:    Medical Decision Making Patient here with worsening shortness of breath over the past week.  History of prior pulmonary embolism.  Not anticoagulated.  Workup reveals bilateral PE.  Will start heparin.  Will need admission.  Amount and/or Complexity of Data Reviewed Labs: ordered.    Details: Trop elevated at 31, likely secondary to heart strain K 2.9, will supplement Normal lactic D-dimer elevated, already high risk for PE, CT pending. Radiology: ordered and independent interpretation performed.    Details: Bilateral PEs ECG/medicine tests: ordered.  Risk OTC drugs. Prescription drug management. Decision regarding hospitalization.     Consultants: I consulted with intensivist, Dr. Denese Killings, who will consult, but after reviewing CT believes patient to be ok for medicine admit.  I consulted with IMTS Residency team, who is appreciated for admitting.   Treatment and Plan: Patient's exam and diagnostic results are concerning for PE.  Feel that patient will need admission to the hospital for further treatment and evaluation.    Final Clinical Impressions(s) / ED Diagnoses     ICD-10-CM   1. Other acute pulmonary embolism with acute cor pulmonale  I26.09       ED Discharge Orders     None         Discharge Instructions Discussed with and Provided to Patient:   Discharge Instructions   None      Roxy Horseman, PA-C 08/12/22 0107    Glendora Score, MD 08/12/22 [phone]

## 2022-08-11 NOTE — ED Notes (Signed)
Pt gone to CT 

## 2022-08-12 ENCOUNTER — Inpatient Hospital Stay (HOSPITAL_COMMUNITY): Payer: Medicaid Other

## 2022-08-12 ENCOUNTER — Emergency Department (HOSPITAL_COMMUNITY): Payer: Medicaid Other

## 2022-08-12 ENCOUNTER — Other Ambulatory Visit (HOSPITAL_COMMUNITY): Payer: Medicaid Other

## 2022-08-12 DIAGNOSIS — I2602 Saddle embolus of pulmonary artery with acute cor pulmonale: Secondary | ICD-10-CM

## 2022-08-12 DIAGNOSIS — F3181 Bipolar II disorder: Secondary | ICD-10-CM | POA: Diagnosis present

## 2022-08-12 DIAGNOSIS — N809 Endometriosis, unspecified: Secondary | ICD-10-CM | POA: Diagnosis present

## 2022-08-12 DIAGNOSIS — K529 Noninfective gastroenteritis and colitis, unspecified: Secondary | ICD-10-CM | POA: Diagnosis not present

## 2022-08-12 DIAGNOSIS — Z7951 Long term (current) use of inhaled steroids: Secondary | ICD-10-CM | POA: Diagnosis not present

## 2022-08-12 DIAGNOSIS — I2699 Other pulmonary embolism without acute cor pulmonale: Secondary | ICD-10-CM | POA: Diagnosis not present

## 2022-08-12 DIAGNOSIS — I519 Heart disease, unspecified: Secondary | ICD-10-CM | POA: Diagnosis not present

## 2022-08-12 DIAGNOSIS — Z1152 Encounter for screening for COVID-19: Secondary | ICD-10-CM | POA: Diagnosis not present

## 2022-08-12 DIAGNOSIS — I2609 Other pulmonary embolism with acute cor pulmonale: Principal | ICD-10-CM

## 2022-08-12 DIAGNOSIS — E119 Type 2 diabetes mellitus without complications: Secondary | ICD-10-CM | POA: Diagnosis present

## 2022-08-12 DIAGNOSIS — I2721 Secondary pulmonary arterial hypertension: Secondary | ICD-10-CM | POA: Diagnosis present

## 2022-08-12 DIAGNOSIS — Z794 Long term (current) use of insulin: Secondary | ICD-10-CM | POA: Diagnosis not present

## 2022-08-12 DIAGNOSIS — N926 Irregular menstruation, unspecified: Secondary | ICD-10-CM | POA: Diagnosis present

## 2022-08-12 DIAGNOSIS — Z86711 Personal history of pulmonary embolism: Secondary | ICD-10-CM | POA: Diagnosis not present

## 2022-08-12 DIAGNOSIS — I2782 Chronic pulmonary embolism: Secondary | ICD-10-CM | POA: Diagnosis present

## 2022-08-12 DIAGNOSIS — E785 Hyperlipidemia, unspecified: Secondary | ICD-10-CM | POA: Diagnosis present

## 2022-08-12 DIAGNOSIS — Z79899 Other long term (current) drug therapy: Secondary | ICD-10-CM | POA: Diagnosis not present

## 2022-08-12 DIAGNOSIS — F1490 Cocaine use, unspecified, uncomplicated: Secondary | ICD-10-CM | POA: Diagnosis present

## 2022-08-12 DIAGNOSIS — D509 Iron deficiency anemia, unspecified: Secondary | ICD-10-CM | POA: Diagnosis present

## 2022-08-12 DIAGNOSIS — F419 Anxiety disorder, unspecified: Secondary | ICD-10-CM | POA: Diagnosis present

## 2022-08-12 DIAGNOSIS — F209 Schizophrenia, unspecified: Secondary | ICD-10-CM | POA: Diagnosis present

## 2022-08-12 DIAGNOSIS — Z87891 Personal history of nicotine dependence: Secondary | ICD-10-CM | POA: Diagnosis not present

## 2022-08-12 DIAGNOSIS — Z7985 Long-term (current) use of injectable non-insulin antidiabetic drugs: Secondary | ICD-10-CM | POA: Diagnosis not present

## 2022-08-12 DIAGNOSIS — D75839 Thrombocytosis, unspecified: Secondary | ICD-10-CM | POA: Diagnosis present

## 2022-08-12 DIAGNOSIS — F1290 Cannabis use, unspecified, uncomplicated: Secondary | ICD-10-CM | POA: Diagnosis present

## 2022-08-12 DIAGNOSIS — J45901 Unspecified asthma with (acute) exacerbation: Secondary | ICD-10-CM | POA: Diagnosis present

## 2022-08-12 DIAGNOSIS — I2694 Multiple subsegmental pulmonary emboli without acute cor pulmonale: Secondary | ICD-10-CM | POA: Diagnosis not present

## 2022-08-12 DIAGNOSIS — E876 Hypokalemia: Secondary | ICD-10-CM | POA: Diagnosis present

## 2022-08-12 DIAGNOSIS — J9601 Acute respiratory failure with hypoxia: Secondary | ICD-10-CM | POA: Diagnosis not present

## 2022-08-12 DIAGNOSIS — I10 Essential (primary) hypertension: Secondary | ICD-10-CM | POA: Diagnosis present

## 2022-08-12 LAB — BASIC METABOLIC PANEL
Anion gap: 12 (ref 5–15)
BUN: 6 mg/dL (ref 6–20)
CO2: 18 mmol/L — ABNORMAL LOW (ref 22–32)
Calcium: 8.5 mg/dL — ABNORMAL LOW (ref 8.9–10.3)
Chloride: 106 mmol/L (ref 98–111)
Creatinine, Ser: 0.74 mg/dL (ref 0.44–1.00)
GFR, Estimated: >60 mL/min (ref >60)
Glucose, Bld: 242 mg/dL — ABNORMAL HIGH (ref 70–99)
Potassium: 3.1 mmol/L — ABNORMAL LOW (ref 3.5–5.1)
Sodium: 136 mmol/L (ref 135–145)

## 2022-08-12 LAB — HEPARIN LEVEL (UNFRACTIONATED)
Heparin Unfractionated: 0.76 IU/mL — ABNORMAL HIGH (ref 0.30–0.70)
Heparin Unfractionated: 0.71 IU/mL — ABNORMAL HIGH (ref 0.30–0.70)

## 2022-08-12 LAB — GLUCOSE, CAPILLARY
Glucose-Capillary: 233 mg/dL — ABNORMAL HIGH (ref 70–99)
Glucose-Capillary: 199 mg/dL — ABNORMAL HIGH (ref 70–99)

## 2022-08-12 LAB — ECHOCARDIOGRAM COMPLETE
AV Mean grad: 2 mmHg
AV Peak grad: 3.3 mmHg
Ao pk vel: 0.91 m/s
Area-P 1/2: 3.97 cm2
S' Lateral: 2.2 cm
Single Plane A4C EF: 66.3 %

## 2022-08-12 LAB — TECHNOLOGIST SMEAR REVIEW

## 2022-08-12 LAB — BRAIN NATRIURETIC PEPTIDE: B Natriuretic Peptide: 127.1 pg/mL — ABNORMAL HIGH (ref 0.0–100.0)

## 2022-08-12 LAB — MAGNESIUM: Magnesium: 2 mg/dL (ref 1.7–2.4)

## 2022-08-12 LAB — CBG MONITORING, ED
Glucose-Capillary: 327 mg/dL — ABNORMAL HIGH (ref 70–99)
Glucose-Capillary: 288 mg/dL — ABNORMAL HIGH (ref 70–99)

## 2022-08-12 LAB — LACTIC ACID, PLASMA
Lactic Acid, Venous: 1.8 mmol/L (ref 0.5–1.9)
Lactic Acid, Venous: 1.6 mmol/L (ref 0.5–1.9)

## 2022-08-12 LAB — TSH: TSH: 2.304 u[IU]/mL (ref 0.350–4.500)

## 2022-08-12 LAB — RESP PANEL BY RT-PCR (RSV, FLU A&B, COVID)  RVPGX2
Influenza A by PCR: NEGATIVE
Influenza B by PCR: NEGATIVE
Resp Syncytial Virus by PCR: NEGATIVE
SARS Coronavirus 2 by RT PCR: NEGATIVE

## 2022-08-12 LAB — TROPONIN I (HIGH SENSITIVITY): Troponin I (High Sensitivity): 33 ng/L — ABNORMAL HIGH (ref <18)

## 2022-08-12 MED ORDER — IOHEXOL 350 MG/ML SOLN
75.0000 mL | Freq: Once | INTRAVENOUS | Status: AC | PRN
  Administered 2022-08-12: 75 mL via INTRAVENOUS

## 2022-08-12 MED ORDER — ACETAMINOPHEN 650 MG RE SUPP: 650.0000 mg | Freq: Four times a day (QID) | RECTAL | Status: DC | PRN

## 2022-08-12 MED ORDER — HYDROMORPHONE HCL 1 MG/ML IJ SOLN
0.5000 mg | INTRAMUSCULAR | Status: DC | PRN
  Administered 2022-08-12 (×3): 1 mg via INTRAVENOUS
  Administered 2022-08-12: 0.5 mg via INTRAVENOUS
  Administered 2022-08-13: 1 mg via INTRAVENOUS
  Administered 2022-08-13: 0.5 mg via INTRAVENOUS
  Administered 2022-08-13 – 2022-08-14 (×4): 1 mg via INTRAVENOUS
  Filled 2022-08-12 (×10): qty 1

## 2022-08-12 MED ORDER — BUDESONIDE 0.25 MG/2ML IN SUSP
0.2500 mg | Freq: Two times a day (BID) | RESPIRATORY_TRACT | Status: DC
  Administered 2022-08-12 – 2022-08-14 (×5): 0.25 mg via RESPIRATORY_TRACT
  Filled 2022-08-12 (×5): qty 2

## 2022-08-12 MED ORDER — SODIUM CHLORIDE 0.9 % IV SOLN
500.0000 mg | INTRAVENOUS | Status: DC
  Administered 2022-08-13: 500 mg via INTRAVENOUS
  Filled 2022-08-12 (×3): qty 5

## 2022-08-12 MED ORDER — INSULIN ASPART 100 UNIT/ML IJ SOLN
0.0000 [IU] | Freq: Three times a day (TID) | INTRAMUSCULAR | Status: DC
  Administered 2022-08-12: 8 [IU] via SUBCUTANEOUS
  Administered 2022-08-12: 5 [IU] via SUBCUTANEOUS
  Administered 2022-08-13: 8 [IU] via SUBCUTANEOUS
  Administered 2022-08-13: 5 [IU] via SUBCUTANEOUS
  Administered 2022-08-13 – 2022-08-14 (×3): 3 [IU] via SUBCUTANEOUS
  Administered 2022-08-15: 5 [IU] via SUBCUTANEOUS
  Administered 2022-08-16: 3 [IU] via SUBCUTANEOUS

## 2022-08-12 MED ORDER — ACETAMINOPHEN 325 MG PO TABS: 650.0000 mg | ORAL_TABLET | Freq: Four times a day (QID) | ORAL | Status: DC | PRN

## 2022-08-12 MED ORDER — ACETAMINOPHEN 500 MG PO TABS
1000.0000 mg | ORAL_TABLET | Freq: Four times a day (QID) | ORAL | Status: DC | PRN
  Administered 2022-08-12 – 2022-08-15 (×3): 1000 mg via ORAL
  Filled 2022-08-12 (×3): qty 2

## 2022-08-12 MED ORDER — GUAIFENESIN ER 600 MG PO TB12
600.0000 mg | ORAL_TABLET | Freq: Two times a day (BID) | ORAL | Status: DC | PRN
  Administered 2022-08-12: 600 mg via ORAL
  Filled 2022-08-12: qty 1

## 2022-08-12 MED ORDER — DICLOFENAC SODIUM 1 % EX GEL: 4.0000 g | Freq: Four times a day (QID) | CUTANEOUS | Status: DC | PRN

## 2022-08-12 MED ORDER — ARFORMOTEROL TARTRATE 15 MCG/2ML IN NEBU
15.0000 ug | INHALATION_SOLUTION | Freq: Two times a day (BID) | RESPIRATORY_TRACT | Status: DC
  Administered 2022-08-12 – 2022-08-13 (×4): 15 ug via RESPIRATORY_TRACT
  Filled 2022-08-12 (×6): qty 2

## 2022-08-12 MED ORDER — HEPARIN BOLUS VIA INFUSION
5000.0000 [IU] | Freq: Once | INTRAVENOUS | Status: AC
  Administered 2022-08-12: 5000 [IU] via INTRAVENOUS
  Filled 2022-08-12: qty 5000

## 2022-08-12 MED ORDER — ALBUTEROL SULFATE (2.5 MG/3ML) 0.083% IN NEBU: 2.5000 mg | INHALATION_SOLUTION | RESPIRATORY_TRACT | Status: DC | PRN

## 2022-08-12 MED ORDER — POTASSIUM CHLORIDE CRYS ER 20 MEQ PO TBCR
40.0000 meq | EXTENDED_RELEASE_TABLET | ORAL | Status: AC
  Administered 2022-08-12 (×2): 40 meq via ORAL
  Filled 2022-08-12 (×2): qty 2

## 2022-08-12 MED ORDER — ARIPIPRAZOLE 5 MG PO TABS
20.0000 mg | ORAL_TABLET | Freq: Every day | ORAL | Status: DC
  Administered 2022-08-12 – 2022-08-13 (×2): 20 mg via ORAL
  Filled 2022-08-12: qty 2
  Filled 2022-08-12: qty 4

## 2022-08-12 MED ORDER — HEPARIN (PORCINE) 25000 UT/250ML-% IV SOLN
1400.0000 [IU]/h | INTRAVENOUS | Status: DC
  Administered 2022-08-12: 1500 [IU]/h via INTRAVENOUS
  Administered 2022-08-13 – 2022-08-14 (×2): 1400 [IU]/h via INTRAVENOUS
  Filled 2022-08-12 (×4): qty 250

## 2022-08-12 MED ORDER — INSULIN DETEMIR 100 UNIT/ML ~~LOC~~ SOLN
20.0000 [IU] | Freq: Every day | SUBCUTANEOUS | Status: DC
  Administered 2022-08-12 – 2022-08-13 (×2): 20 [IU] via SUBCUTANEOUS
  Filled 2022-08-12 (×3): qty 0.2

## 2022-08-12 MED ORDER — INSULIN ASPART 100 UNIT/ML IJ SOLN
0.0000 [IU] | Freq: Every day | INTRAMUSCULAR | Status: DC
  Administered 2022-08-14: 0 [IU] via SUBCUTANEOUS

## 2022-08-12 MED ORDER — LAMOTRIGINE 25 MG PO TABS
150.0000 mg | ORAL_TABLET | Freq: Every day | ORAL | Status: DC
  Administered 2022-08-12 – 2022-08-16 (×5): 150 mg via ORAL
  Filled 2022-08-12 (×2): qty 2
  Filled 2022-08-12 (×2): qty 1
  Filled 2022-08-12: qty 6
  Filled 2022-08-12: qty 1

## 2022-08-12 MED ORDER — INSULIN ASPART 100 UNIT/ML IJ SOLN
0.0000 [IU] | Freq: Three times a day (TID) | INTRAMUSCULAR | Status: DC
  Administered 2022-08-12: 11 [IU] via SUBCUTANEOUS

## 2022-08-12 MED ORDER — ALBUTEROL SULFATE (2.5 MG/3ML) 0.083% IN NEBU: 2.5000 mg | INHALATION_SOLUTION | Freq: Four times a day (QID) | RESPIRATORY_TRACT | Status: DC | PRN

## 2022-08-12 MED ORDER — GUAIFENESIN-DM 100-10 MG/5ML PO SYRP
5.0000 mL | ORAL_SOLUTION | ORAL | Status: DC | PRN
  Administered 2022-08-12 – 2022-08-16 (×5): 5 mL via ORAL
  Filled 2022-08-12 (×5): qty 5

## 2022-08-12 MED ORDER — INSULIN DETEMIR 100 UNIT/ML ~~LOC~~ SOLN: 10.0000 [IU] | Freq: Every day | SUBCUTANEOUS | Status: DC

## 2022-08-12 MED ORDER — SODIUM CHLORIDE 0.9 % IV SOLN
2.0000 g | INTRAVENOUS | Status: DC
  Administered 2022-08-12 – 2022-08-13 (×2): 2 g via INTRAVENOUS
  Filled 2022-08-12 (×2): qty 20

## 2022-08-12 MED ORDER — ONDANSETRON 4 MG PO TBDP
4.0000 mg | ORAL_TABLET | Freq: Once | ORAL | Status: AC
  Administered 2022-08-12: 4 mg via ORAL
  Filled 2022-08-12: qty 1

## 2022-08-12 MED ORDER — POLYETHYLENE GLYCOL 3350 17 G PO PACK: 17.0000 g | PACK | Freq: Every day | ORAL | Status: DC | PRN

## 2022-08-12 NOTE — ED Notes (Signed)
Pt transported to xray 

## 2022-08-12 NOTE — Progress Notes (Signed)
Case reviewed, patient seen, echo reviewed.  She's in the intermediate zone in terms of PE risk.  Ongoing dyspnea and tachycardia are only concerning things here but have alternative explanations.  Her presyncope was in setting of ongoing diarrhea/dehydration.  Would argue size of PA trunk and echo appearance argue for a chronic process.  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  No threatened obstructive shock by history, labs, or echo.  cdTPA may offer improvement in symptom resolution time but not sure benefits outweigh risks at present.  She may have a concurrent CAP/bronchitis (distribution of infiltrates are atypical for pulmonary infarcts).  Would start LABA/ICS nebs, abx, check Pct, and continue heparin.  Will check on her tomorrow, if persistent symptoms may be obligated to offer cdTPA.  If any hemodynamic collapse would do TNK but suspect lower risk for this based on current vitals and clinical trajectory.  She needs f/u imaging of her LLL nodular infiltrate, it is odd looking with large soft tissue component.  Myrla Halsted MD PCCM

## 2022-08-12 NOTE — Progress Notes (Signed)
Hospital day#0 Subjective:   Summary: Dana Hughes is a 42 yo person living with a prior history of submassive PE in 2022 on prior AC, T2DM, HTN, bipolar II disorder, IDA admitted to the hospital for bilateral pulmonary embolism with evidence of right heart strain on EKG and CTA now being managed with IV heparin ggt  Interim history: Patient endorses improvement in chest pain and presyncopal episodes but continues to feel short of breath. She continues to cough up phlegm without evidence of hemoptysis. Reports that has been off of [facility] since September of last year. Has had a miscarriage in 2004, but has had successful pregnancies after it. Confirms history of colon cancer in family and several family members with blood clots. R foot discoloration and discomfort has resolved and feels normal now.   Objective:  Vital signs in last 24 hours: Vitals:   08/12/22 0301 08/12/22 0415 08/12/22 0645 08/12/22 0844  BP:  107/85 107/83   Pulse:  (!) 105 (!) 107   Resp:  (!) 23 (!) 28   Temp: 98.5 F (36.9 C)   98.3 F (36.8 C)  TempSrc: Oral   Oral  SpO2:  97% 97%    Supplemental O2:  SpO2: 97 % O2 Flow Rate (L/min): 3 L/min There were no vitals filed for this visit.  Physical Exam:  Constitutional: No acute distress HENT: Moist MM Neck: supple Cardiovascular: tachycardic. No murmurs. No JVD. 2+ radial and DP pulses Pulmonary/Chest: Mildly increased WOB on 3L [place]. CTAB without wheezing. Abdominal: Normal BS, soft, non-tender, non-distended.  MSK: normal bulk and tone. Trace LE pitting edema.  Neurological: alert & oriented x 3, Skin: warm and dry Psych: Pleasant mood and affect  Pertinent Labs:    Latest Ref Rng & Units 08/11/2022   10:05 PM 05/16/2022    3:43 PM 05/01/2022    3:08 PM  CBC  WBC 4.0 - 10.5 K/uL 11.7  9.8  8.9   Hemoglobin 12.0 - 15.0 g/dL 16.1  09.6  9.7   Hematocrit 36.0 - 46.0 % 33.8  34.3  33.4   Platelets 150 - 400 K/uL 334  481.0  404        Latest Ref Rng  & Units 08/12/2022    5:35 AM 08/11/2022   10:05 PM 05/01/2022    6:45 PM  CMP  Glucose 70 - 99 mg/dL 045  409    BUN 6 - 20 mg/dL 6  6    Creatinine 8.11 - 1.00 mg/dL 9.14  7.82    Sodium 956 - 145 mmol/L 136  136    Potassium 3.5 - 5.1 mmol/L 3.1  2.9    Chloride 98 - 111 mmol/L 106  106    CO2 22 - 32 mmol/L 18  18    Calcium 8.9 - 10.3 mg/dL 8.5  8.7    Total Protein 6.5 - 8.1 g/dL  7.2  6.8   Total Bilirubin 0.3 - 1.2 mg/dL  0.4  0.2   Alkaline Phos 38 - 126 U/L  59  61   AST 15 - 41 U/L  23  26   ALT 0 - 44 U/L  30  26     Imaging: VAS [place] LOWER EXTREMITY VENOUS (DVT)  Result Date: 08/12/2022  Lower Venous DVT Study Patient Name:  Dana Hughes  Date of Exam:   08/12/2022 Medical Rec #: [id number]         Accession #:    [id number] Date of  Birth: 24-Sep-1980          Patient Gender: F Patient Age:   17 years Exam Location:  [facility] Procedure:      VAS [place] LOWER EXTREMITY VENOUS (DVT) Referring Phys: Pia Mau --------------------------------------------------------------------------------  Indications: New Pulmonary embolism.  Risk Factors: History of PE 08/08/20. Comparison Study: Prior negative bilateral LEV done 08/08/20 Performing Technologist: Sherren Kerns RVS  Examination Guidelines: A complete evaluation includes B-mode imaging, spectral Doppler, color Doppler, and power Doppler as needed of all accessible portions of each vessel. Bilateral testing is considered an integral part of a complete examination. Limited examinations for reoccurring indications may be performed as noted. The reflux portion of the exam is performed with the patient in reverse Trendelenburg.  +--------+---------------+---------+-----------+----------------+-------------+ RIGHT   CompressibilityPhasicitySpontaneityProperties      Thrombus                                                                 Aging          +--------+---------------+---------+-----------+----------------+-------------+ CFV     Full                               pulsatile                                                                waveforms                     +--------+---------------+---------+-----------+----------------+-------------+ SFJ     Full                                                             +--------+---------------+---------+-----------+----------------+-------------+ FV Prox Full                                                             +--------+---------------+---------+-----------+----------------+-------------+ FV Mid  Full                               pulsatile                                                                waveforms                     +--------+---------------+---------+-----------+----------------+-------------+ FV      Full  Distal                                                                   +--------+---------------+---------+-----------+----------------+-------------+ PFV     Full                                                             +--------+---------------+---------+-----------+----------------+-------------+ POP     Full                               pulsatile                                                                waveforms                     +--------+---------------+---------+-----------+----------------+-------------+ PTV     Full                                                             +--------+---------------+---------+-----------+----------------+-------------+ PERO    Full                                                             +--------+---------------+---------+-----------+----------------+-------------+   +--------+---------------+---------+-----------+----------------+-------------+ LEFT     CompressibilityPhasicitySpontaneityProperties      Thrombus                                                                 Aging         +--------+---------------+---------+-----------+----------------+-------------+ CFV     Full                               pulsatile                                                                waveforms                     +--------+---------------+---------+-----------+----------------+-------------+  SFJ     Full                                                             +--------+---------------+---------+-----------+----------------+-------------+ FV Prox Full                                                             +--------+---------------+---------+-----------+----------------+-------------+ FV Mid  Full                               pulsatile                                                                waveforms                     +--------+---------------+---------+-----------+----------------+-------------+ FV      Full                                                             Distal                                                                   +--------+---------------+---------+-----------+----------------+-------------+ PFV     Full                                                             +--------+---------------+---------+-----------+----------------+-------------+ POP     Full                               pulsatile                                                                waveforms                     +--------+---------------+---------+-----------+----------------+-------------+ PTV     Full                                                             +--------+---------------+---------+-----------+----------------+-------------+  PERO    Full                                                              +--------+---------------+---------+-----------+----------------+-------------+     Summary: BILATERAL: - No evidence of deep vein thrombosis seen in the lower extremities, bilaterally. -No evidence of popliteal cyst, bilaterally. RIGHT: pulsatile waveforms  LEFT: Pulsatile waveforms.  *See table(s) above for measurements and observations.    Preliminary    CT Angio Chest PE W and/or Wo Contrast  Result Date: 08/12/2022 CLINICAL DATA:  Chest pain. EXAM: CT ANGIOGRAPHY CHEST WITH CONTRAST TECHNIQUE: Multidetector CT imaging of the chest was performed using the standard protocol during bolus administration of intravenous contrast. Multiplanar CT image reconstructions and MIPs were obtained to evaluate the vascular anatomy. RADIATION DOSE REDUCTION: This exam was performed according to the departmental dose-optimization program which includes automated exposure control, adjustment of the mA and/or kV according to patient size and/or use of iterative reconstruction technique. CONTRAST:  75mL OMNIPAQUE IOHEXOL 350 MG/ML SOLN COMPARISON:  December 18, 2021 FINDINGS: Cardiovascular: Satisfactory opacification of the pulmonary arteries to the segmental level. Marked severity areas of intraluminal low attenuation are seen within the distal aspects of the bilateral pulmonary arteries. There is extension to involve multiple bilateral upper lobe, right middle lobe and bilateral lower lobe branches of the pulmonary arteries. No saddle embolus is seen. Normal heart size. There is right heart strain with an RV/LV ratio of 1.31. No pericardial effusion. Mediastinum/Nodes: No enlarged mediastinal, hilar, or axillary lymph nodes. Thyroid gland, trachea, and esophagus demonstrate no significant findings. Lungs/Pleura: Mild patchy anteromedial right upper lobe and posteromedial left lower lobe infiltrates are seen. There is no evidence of a pleural effusion or pneumothorax. Upper Abdomen: Surgical clips are seen within the  gallbladder fossa. Musculoskeletal: No chest wall abnormality. No acute or significant osseous findings. Review of the MIP images confirms the above findings. IMPRESSION: 1. Marked severity bilateral pulmonary embolism with right heart strain (RV/LV Ratio = 1.31) which has been associated with an increased risk of morbidity and mortality. 2. Mild patchy right upper lobe and left lower lobe infiltrates. 3. Evidence of prior cholecystectomy. Electronically Signed   By: Aram Candela M.D.   On: 08/12/2022 00:16   DG Chest 2 View  Result Date: 08/11/2022 CLINICAL DATA:  Shortness of breath and productive cough. EXAM: CHEST - 2 VIEW COMPARISON:  PA and lateral 05/01/2022 FINDINGS: The heart size and mediastinal contours are within normal limits. Both lungs are clear. The visualized skeletal structures are unremarkable. There are cholecystectomy clips in the upper abdomen. IMPRESSION: No evidence of acute chest disease or interval changes. Electronically Signed   By: Almira Bar M.D.   On: 08/11/2022 22:39    Assessment/Plan:   Principal Problem:   Pulmonary embolism Active Problems:   Pulmonary embolus   Acute submassive B/L PE Hx pulmonary 2022 unprovoked vs Depo-provera 1-week hx of DOE and chest pain. CTA with bilateral pulmonary embolism and R heart strain  Dimer and BMP elevated. PCCM consulted on admission; no emergent reperfusion therapy. Would reconsider after 2D echocardiogram. Patient started on IV heparin drip. Continues to be dyspneic, tachypneic and tachycardic up to 123 this AM. Normotensive.  No evidence of DVT on VAS [place]. Concerns for hypercoagulable disorder given family  hx and personal history of 1x miscarriage and previous PE vs malignancy given incomplete colonoscopy evaluation in 2022 with evidence of tubular adenoma.  -Consider re-engaging PCCM if hemodynamic instability -Continue Heparin ggt -F/u TTE -Transition to DOAC prior to DX -Outpatient work up for hypercoagulable  state  Insulin dependent type 2 diabetes mellitus  06/12/2022 A1c 12.6.  -Lantus 20 units QHS -Moderate SSI with meals and bedtime correction -Monitor cBGs  Hypokalemia 2/2 recent GI illness -CTM and supplement  Iron deficiency anemia Hgb 10.1 on admission - at baseline. Unclear etiology; presumed to be from endometriosis and uterine fibroids and menorrhagia. However, with patient's significant family history of  colon cancer, will need outpatient follow up.  -F/u Fe studies and supplement at needed -Outpatient GI and OB-GYN follow up  Thrombocytosis Smear with acanthocytes  likely in the setting of chronic anemia. Other differential include hypothyroidism,drug induced, myelodysplastic syndromes,. CMP without liver abnormalities.  -Follow up TSH -Will consider LDH, reticulocyte, and haptoglobin blabs -Monitor CBC  Schizophrenia Depression/Anxiety -continue abilify and Lamictal  HTN Holding home amlodipine 5 mg, metoprolol 100 mg daily, in setting of PE  Polysubstance use -continue to encourage cessation of cocaine and marijuana  Diet: Carb-Modified VTE: Heparin Code: Full PT/OT recs: pending   Dispo: Anticipated discharge in 1-2 days pending clinical improvement.   Morene Crocker, MD Internal Medicine Resident PGY-1 Please contact the on call pager after 5 pm and on weekends at [phone].

## 2022-08-12 NOTE — Hospital Course (Addendum)
You were admitted to the hospital for shortness of breath and chest pain and found to have blood clots in your bilateral lungs.  4/21 Breathing feels better this morning Does not feel lightheaded Has had chest pain for 1 week Coughig up clear phlegm, no blood - has been off anticoagulation since sept/oct  - had miscarriage in 2004, then got pregnant in 2005  - yesterday she elevated her R foot and it turned purple for about 30 min  ----- Hi Nurse - This patient is being considered for discharge today. Could you please help me with the following: -ambulatory pulse oximetry -Please give inhaler to patient prior to discharge -  New medications - Eliquis - 10 mg every 12 hours 08/14/2022 - 08/20/2022 - Eliquis - 5 mg every 12 hours 08/21/2022 - indefinitely -Dulera   Acute sub-massive bilateral pulmonary embolism Hx of pulmonary embolism 2022 CTA chest bilateral PE with RV/LV 1.31 and patchy RUL and LL infiltrates. TTE showed LV EF 60-65%, positive McConnell's sign with moderate RV enlargement. Negative LE VAS [place] for DVT. Patient with persistent tachycardia and dyspnea with 3-4L oxygen requirement with limited functional state. Pulmonology was consulted; patient was initiated on heparin infusion therapy for 2 days prior to undergoing catheter directed lytics on 4/22. Patient was observed in the ICU for 24 hours after which was transitioned to the IMTS service. Patient was transitioned to Eliquis therapy on 08/14/2022. Patient is to follow up with Dr. Judeth Horn at [facility] pulmonology on 5/30 11:15 AM  Asthma No PFTs on file. Patient previously on albuterol inhaler now started on Breo Ellipta daily. Will discharge on daily *** Dulera vs Symbicort***  Insulin dependent type 2 diabetes mellitus Last A1c of 12.6%. Home regimen of metformin, lantus 20 U QHS and novolog 10 U BID. She was also recently prescribed Trulicity at recent [facility] appointment but patient was unaware on how to use it.  Patient received diabetes education during this admission and will be discharge on her home regimen.    Hypokalemia Given K supplements    Schizophrenia Depression/Anxiety Continue abilify and lamictal   Iron deficiency anemia Patient with a history of iron deficiency anemia of unclear etiology confirmed during this hospitalization. Does have Hx of endometriosis ,uterine fibroids, and menorrhagia. No overt signs of bleeding during this hospitalization. Patient received 1x FerraHeme 510 mg on admission. Will . Recommend   -follow up outpatient iron studies and continue oral iron  -follow up with OBGYN for fibroids and GI for colonoscopy    Hypertension -hold anti-hypertensives in setting of pulmonary embolism   Thrombocytosis On chart review appears to have had history. No blood smear on file.  -follow up blood smear   Hx of asthma Reports history of asthma, no PFT on file.  -continue PRN albuterol -needs outpatient PFT   Hx of colonic polyps Colonoscopy scheduled for next month   Hx of uterine fibroids Endorses irregular menstrual cycles. Has not followed up with OBGYN ins ome time, needs to follow up outpatient. Has not been on birth control supplements since prior PE.    Polysubstance use -continue to encourage cessation of cocaine and marijuana --- 4/22: Still having SOB and lightheadedness with ambulation. Required assistance getting up to her chair. Reports SOB has not improved. Currently on 3.5 L of oxygen. Does not use oxygen at home. Denies hemoptysis, hematuria, or hematochezia.    4/24: On 4.5L. Still having wheezing. Feeling somewhat down.  4/25: Patient was able to sleep well overnight. She  reports that she is feeling well overall but having some abdominal cramping. Still having watery diarrhea, has been present over the last 3 days. Reports some improvement with exertional dyspnea. Coughing up a small amount of white mucous. Denies chest pain, hemoptysis,  hematuria, or hematochezia. Reports that her neck pain has resolved. She is eating well.

## 2022-08-12 NOTE — Progress Notes (Signed)
ANTICOAGULATION CONSULT NOTE - Initial Consult  Pharmacy Consult for IV heparin Indication: pulmonary embolus  Allergies  Allergen Reactions   Mushroom Extract Complex Anaphylaxis   Latex Itching and Swelling   Penicillins Itching    Has patient had a PCN reaction causing immediate rash, facial/tongue/throat swelling, SOB or lightheadedness with hypotension: No Has patient had a PCN reaction causing severe rash involving mucus membranes or skin necrosis: No Has patient had a PCN reaction that required hospitalization No Has patient had a PCN reaction occurring within the last 10 years: No If all of the above answers are "NO", then may proceed with Cephalosporin use.     Patient Measurements:   Heparin Dosing Weight: 94 kg (from 2/24)  Vital Signs: Temp: 98.2 F (36.8 C) (04/20 2200) Temp Source: Oral (04/20 2200) BP: 112/85 (04/21 0032) Pulse Rate: 108 (04/21 0032)  Labs: Recent Labs    08/11/22 2205 08/12/22 0036  HGB 10.1*  --   HCT 33.8*  --   PLT 334  --   CREATININE 0.78  --   TROPONINIHS 31* 33*    CrCl cannot be calculated (Unknown ideal weight.).   Medical History: Past Medical History:  Diagnosis Date   Anxiety    Arthritis    Asthma    Bronchitis    BV (bacterial vaginosis)    Candidiasis    Depression    Diabetes mellitus without complication (HCC)    Endometriosis    Headache(784.0)    migraines   Hypertension    no longer takes meds   Migraines    Muscle spasm    Ovarian cyst    Pelvic pain in female 09/29/2015   Scoliosis    Uterine fibroid     Medications:  Infusions:   heparin      Assessment: 42 yo female admitted with pulmonary embolism, pharmacy asked to start anticoagulation with IV heparin.  Previously on Eliquis for PE in 2022 - no longer taking.  Goal of Therapy:  Heparin level 0.3-0.7 units/ml Monitor platelets by anticoagulation protocol: Yes   Plan:  Start IV heparin with bolus of 5000 units Then start heparin  gtt at 1500 units/hr Check heparin level 6 hrs after gtt starts Daily heparin level and CBC.  Reece Leader, Colon Flattery, BCCP Clinical Pharmacist  08/12/2022 1:33 AM   [facility] pharmacy phone numbers are listed on [email or web address]

## 2022-08-12 NOTE — Progress Notes (Signed)
ANTICOAGULATION CONSULT NOTE - Follow Up Consult  Pharmacy Consult for IV heparin Indication: pulmonary embolus  Allergies  Allergen Reactions   Mushroom Extract Complex Anaphylaxis   Latex Itching and Swelling   Penicillins Itching    Has patient had a PCN reaction causing immediate rash, facial/tongue/throat swelling, SOB or lightheadedness with hypotension: No Has patient had a PCN reaction causing severe rash involving mucus membranes or skin necrosis: No Has patient had a PCN reaction that required hospitalization No Has patient had a PCN reaction occurring within the last 10 years: No If all of the above answers are "NO", then may proceed with Cephalosporin use.     Patient Measurements:   Heparin Dosing Weight: 94 kg  Vital Signs: Temp: 98.3 F (36.8 C) (04/21 0844) Temp Source: Oral (04/21 0844) BP: 107/83 (04/21 0645) Pulse Rate: 107 (04/21 0645)  Labs: Recent Labs    08/11/22 2205 08/12/22 0036 08/12/22 0535 08/12/22 0800  HGB 10.1*  --   --   --   HCT 33.8*  --   --   --   PLT 334  --   --   --   HEPARINUNFRC  --   --   --  0.76*  CREATININE 0.78  --  0.74  --   TROPONINIHS 31* 33*  --   --     CrCl cannot be calculated (Unknown ideal weight.).   Medications:  Scheduled:   ARIPiprazole  20 mg Oral Daily   insulin aspart  0-15 Units Subcutaneous TID WC   insulin detemir  20 Units Subcutaneous QHS   lamoTRIgine  150 mg Oral Daily   potassium chloride  40 mEq Oral Q4H   Infusions:   heparin 1,500 Units/hr (08/12/22 0153)    Assessment: 42 yo female admitted with pulmonary embolism, pharmacy asked to start anticoagulation with IV heparin. Previously on Eliquis for PE in 2022 - no longer taking.   Initial heparin level slight supra-therapeutic at 0.76 after bolus - suspect some bolus effect. Will continue and recheck. No bleeding reported.   Goal of Therapy:  Heparin level 0.3-0.7 units/ml Monitor platelets by anticoagulation protocol: Yes    Plan:  Continue heparin at 1500 units/hr.  Recheck heparin level in 6 hours.  Daily heparin level and CBC.   Link Snuffer, PharmD, BCPS, BCCCP Clinical Pharmacist Please refer to [facility] for [facility] Pharmacy numbers 08/12/2022,9:39 AM

## 2022-08-12 NOTE — H&P (Addendum)
Date: 08/12/2022               Patient Name:  Dana Hughes  DOB: Aug 28, 1980 Age / Sex: 42 y.o., female   PCP: Evlyn Kanner, MD         Medical Service: Internal Medicine Teaching Service         Attending Physician: Dr. Earl Lagos, MD      First Contact: Dr. Morene Crocker, MD Pager [phone]    Second Contact: Dr. Elza Rafter, DO Pager [phone]         After Hours (After 5p/  First Contact Pager: [phone]  weekends / holidays): Second Contact Pager: [phone]   SUBJECTIVE   Chief Complaint: Chest tightness, shortness of breath  History of Present Illness:   Dana Hughes is a 42 y/o person living with a pertinent history of submassive pulmonary embolism 2022, T2DM, HTN, bipolar II disorder, IDA who presents with worsening dyspnea and chest tightness for the past 4 days.   She first noticed dyspnea and chest tightness with exertion 4 days ago. This progressively worsened and she felt as though she was having an asthma attack 2 days ago with worsening dyspnea, wheezing, and a dry cough. She used albuterol inhaler and nebulizer with some relief but the dyspnea and chest tightness persisted. She planned to come into the internal medicine clinic Monday but could not wait and presented to the ED.   The chest tightness is located in the center of her chest and left shoulder. Has had some left lower back pain as well. Associated symptoms of lightheadedness, pleurisy. Denies syncope.   Prior to her chest tightness, she had an acute GI illness after bein exposed to family members with similar symptoms. She had nausea, non-bloody diarrhea for 2 days that has since resolved. She had 5-6 episodes of diarrhea per day. Had some associated epigastric abdominal pain that has since resolved.   Denies significant weight loss. Has had hot/cold sweats over the last week, denies night sweats prior to this. No recent long car or airplane trips. Denies calf pain, redness,  swelling. Did notice purple cool feeling in right ankle prior to feeling dyspneic/chest tightness, but this resolved after elevating her leg and wearing thick socks. Not on estrogen supplementation  Meds:  Metformin 1000 mg BID Lantus 20 daily Novolog 10 BID w/ meals Amlodipine 5 mg daily Hydroxyzine PRN Gabapentin 300 mg TID Abilify 20 mg daily Lamictal 150 mg daily Metoprolol 100 mg daily Tasimelteon 20 mg QHS Iron supplements  Past Medical History Submassive pulmonary embolism Diabetes type 2 HTN HLD Asthma/Bronchitis Anxiety Depression Bipolar II Iron deficiency anemia Pica Vaginal fibroids Colonic polys Endometriosis  Past Surgical History:  Procedure Laterality Date   BIOPSY  08/12/2020   Procedure: BIOPSY;  Surgeon: Napoleon Form, MD;  Location: [facility];  Service: Endoscopy;;   CHOLECYSTECTOMY     COLONOSCOPY WITH PROPOFOL N/A 08/12/2020   Procedure: COLONOSCOPY WITH PROPOFOL;  Surgeon: Napoleon Form, MD;  Location: [facility];  Service: Endoscopy;  Laterality: N/A;   ESOPHAGOGASTRODUODENOSCOPY (EGD) WITH PROPOFOL N/A 08/12/2020   Procedure: ESOPHAGOGASTRODUODENOSCOPY (EGD) WITH PROPOFOL;  Surgeon: Napoleon Form, MD;  Location: [facility];  Service: Endoscopy;  Laterality: N/A;   POLYPECTOMY  08/12/2020   Procedure: POLYPECTOMY;  Surgeon: Napoleon Form, MD;  Location: [facility];  Service: Endoscopy;;   TUBAL LIGATION     Social:  Lives With: Boyfriend and children Occupation: Not currently employed Support: Family and friends  Level of Function: usually able to perform ADL/IADL [facility]: no tobacco use. Occasional alchol use. Daily marijuana use. Occasional cocaine use last used last week, used monthly.   Family History:  Father - colon cancer at 67 y/o Mother - HTN Brother - adopted Sister - No health problems  Paternal Aunt - HIV, Breast Cancer, Colon cancer Paternal Grandmother - Lupus, Breast  Cancer Maternal Aunt - Blood clots (unclear etiology)  Allergies: Allergies as of 08/11/2022 - Review Complete 06/12/2022  Allergen Reaction Noted   Mushroom extract complex Anaphylaxis 06/05/2014   Latex Itching and Swelling 07/07/2017   Penicillins Itching 01/18/2011   Review of Systems: A complete ROS was negative except as per HPI.   OBJECTIVE:   Physical Exam: Blood pressure 112/85, pulse (!) 108, temperature 98.2 F (36.8 C), temperature source Oral, resp. rate (!) 23, SpO2 100 %.  Constitutional: no acute distress HENT: normocephalic atraumatic Eyes: conjunctiva non-erythematous Neck: supple Cardiovascular: tachycardic. No murmurs,gallops rubs. JVD base of neck Pulmonary/Chest: normal work of breathing on 3L [place]. Lungs clear to auscultation bilaterally  Abdominal: soft, non-tender, non-distended MSK: normal bulk and tone. No calf tenderness, erythema. Bilateral trace lower extremity edema. 2+ DP pulses bilaterally  Neurological: alert & oriented x 3 Skin: warm and dry. No rash Psych: pleasant  Labs: CBC    Latest Ref Rng & Units 08/11/2022   10:05 PM 05/16/2022    3:43 PM 05/01/2022    3:08 PM  CBC  WBC 4.0 - 10.5 K/uL 11.7  9.8  8.9   Hemoglobin 12.0 - 15.0 g/dL 04.5  40.9  9.7   Hematocrit 36.0 - 46.0 % 33.8  34.3  33.4   Platelets 150 - 400 K/uL 334  481.0  404    CMP     Latest Ref Rng & Units 08/11/2022   10:05 PM 05/01/2022    6:45 PM 05/01/2022    3:08 PM  CMP  Glucose 70 - 99 mg/dL 811   914   BUN 6 - 20 mg/dL 6   <5   Creatinine 7.82 - 1.00 mg/dL 9.56   2.13   Sodium 086 - 145 mmol/L 136   133   Potassium 3.5 - 5.1 mmol/L 2.9   3.5   Chloride 98 - 111 mmol/L 106   103   CO2 22 - 32 mmol/L 18   21   Calcium 8.9 - 10.3 mg/dL 8.7   8.5   Total Protein 6.5 - 8.1 g/dL 7.2  6.8    Total Bilirubin 0.3 - 1.2 mg/dL 0.4  0.2    Alkaline Phos 38 - 126 U/L 59  61    AST 15 - 41 U/L 23  26    ALT 0 - 44 U/L 30  26     Imaging:  CT angio chest 1. Marked  severity bilateral pulmonary embolism with right heart strain (RV/LV Ratio = 1.31) which has been associated with an increased risk of morbidity and mortality. 2. Mild patchy right upper lobe and left lower lobe infiltrates. 3. Evidence of prior cholecystectomy.  EKG: personally reviewed my interpretation is sinus tachycardia, extreme axis, non-specific T wave changes. V7,Q4,O9 pattern present. Prior EKG with left axis deviation and did not show S1,Q3,T3.   ASSESSMENT & PLAN:   Assessment & Plan by Problem: Principal Problem:   Pulmonary embolism Active Problems:   Pulmonary embolus  Dana Hughes is a 42 y.o. person living with a history of submassive pulmonary embolism  2022, T2DM, HTN, bipolar II disorder, IDA  who presented with dyspnea and chest tightness and admitted for bilateral pulmonary embolism with right heart strain on hospital day 0  Acute sub-massive bilateral pulmonary embolism Hx of pulmonary embolism 2022 Hx of high risk pulmonary embolism with right heart strain 07/2020 that was unclear if provoked from megace/depo-provera, which since has been discontinued. Also questionable how long she was on eliquis for once discharged, she notes not completing the full 6 month course. This was also complicated by a stent of rectal bleeding. She had a follow up CT 08/22 which showed chronic pulmonary embolisms  Since she has felt well until last week when she began feeling dyspneic/chest tightness. CT today with evidence of bilateral pulmonary embolism with right heart strain. D dimer and BNP elevated. Leukocytosis in setting of PE. She is hemodynamically stable. PCCM consulted and do not believe she needs emergent transfer to ICU or reperfusion therapies. Will admit and monitor hemodynamics, if any changes will consult IR for consideration of reperfusion therapy  Denied sedentary state or recent surgery. She does note purple color to her right foot that resolved, no calf  pain/tenderness. Has significant family history of breast/colon cancer. Last screening mammogram 07/2021 that was normal. Last colonoscopy 07/2020 that was not completed due to inadequate bowel prep, was found to have tubular adenoma polyp. Has upcoming repeat colonoscopy. She is no longer on estrogen supplementation. With second event, believe she warrants hypercoagulable testing once treated for her acute PE and needs continued cancer screening.   -appreciate PCCM assistance, consider reperfusion therapies if becomes hemodynamically unstable -Heparin GGT, transition to oral anticoag and follow up duration -monitor hemodynamics, cardiac monitoring -echocardiogram and lower extremity ultrasound ordered -troponin flat and lactic acid normal, will hold off on further testing -tylenol and dilaudid PRN for any chest pain  Insulin dependent type 2 diabetes mellitus Last A1c of 12.6%. Home regimen of metformin, lantus 20 U QHS and novolog 10 U BID.  -Lantus 20 U QHS -SSI  Hypokalemia Given K supplements in ED. Will check mag tomorrow morning  Schizophrenia Depression/Anxiety -continue abilify and lamictal  Iron deficiency anemia Hgb of 10.1 at baseline. She notes history of IDA with unclear etiology. Most recent iron studies 04/2022. Does have Hx of endometriosis and uterine fibroids. Denies bloody stools, but with significant family history of colon cancer will need to make certain follows up with colonoscopy. No obvious source of bleeding -follow up outpatient iron studies and continue oral iron  -follow up with OBGYN for fibroids and GI for colonoscopy   Hypertension -hold anti-hypertensives in setting of pulmonary embolism  Thrombocytosis On chart review appears to have had history. No blood smear on file.  -follow up blood smear  Hx of asthma Reports history of asthma, no PFT on file.  -continue PRN albuterol -needs outpatient PFT  Hx of colonic polyps Colonoscopy scheduled for  next month  Hx of uterine fibroids Endorses irregular menstrual cycles. Has not followed up with OBGYN ins ome time, needs to follow up outpatient. Has not been on birth control supplements since prior PE.   Polysubstance use -continue to encourage cessation of cocaine and marijuana  Diet: Carb-Modified VTE: Heparin IVF: None,None Code: Full  Prior to Admission Living Arrangement: Home, living family Anticipated Discharge Location: Home Barriers to Discharge: further monitoring for her PE  Dispo: Admit patient to Inpatient with expected length of stay greater than 2 midnights.  Signed: Belva Agee, MD Internal Medicine Resident PGY-3  08/12/2022, 3:09  AM    

## 2022-08-12 NOTE — Progress Notes (Signed)
ANTICOAGULATION CONSULT NOTE - Follow Up Consult  Pharmacy Consult for IV heparin Indication: pulmonary embolus  Allergies  Allergen Reactions   Mushroom Extract Complex Anaphylaxis   Latex Itching and Swelling   Penicillins Itching    Has patient had a PCN reaction causing immediate rash, facial/tongue/throat swelling, SOB or lightheadedness with hypotension: No Has patient had a PCN reaction causing severe rash involving mucus membranes or skin necrosis: No Has patient had a PCN reaction that required hospitalization No Has patient had a PCN reaction occurring within the last 10 years: No If all of the above answers are "NO", then may proceed with Cephalosporin use.     Patient Measurements:   Heparin Dosing Weight: 94 kg  Vital Signs: Temp: 98.1 F (36.7 C) (04/21 1244) Temp Source: Oral (04/21 1244) BP: 107/64 (04/21 1345) Pulse Rate: 117 (04/21 1345)  Labs: Recent Labs    08/11/22 2205 08/12/22 0036 08/12/22 0535 08/12/22 0800 08/12/22 1650  HGB 10.1*  --   --   --   --   HCT 33.8*  --   --   --   --   PLT 334  --   --   --   --   HEPARINUNFRC  --   --   --  0.76* 0.71*  CREATININE 0.78  --  0.74  --   --   TROPONINIHS 31* 33*  --   --   --      CrCl cannot be calculated (Unknown ideal weight.).   Medications:  Scheduled:   arformoterol  15 mcg Nebulization BID   ARIPiprazole  20 mg Oral Daily   budesonide (PULMICORT) nebulizer solution  0.25 mg Nebulization BID   insulin aspart  0-15 Units Subcutaneous TID WC   insulin aspart  0-5 Units Subcutaneous QHS   insulin detemir  20 Units Subcutaneous QHS   lamoTRIgine  150 mg Oral Daily   Infusions:   azithromycin     cefTRIAXone (ROCEPHIN)  IV Stopped (08/12/22 1357)   heparin 1,500 Units/hr (08/12/22 0153)    Assessment: 42 yo female admitted with pulmonary embolism, pharmacy asked to start anticoagulation with IV heparin. Previously on Eliquis for PE in 2022 - no longer taking.   Heparin level  remains slightly supratherapeutic at 0.71 on 1500 units/hr  Goal of Therapy:  Heparin level 0.3-0.7 units/ml Monitor platelets by anticoagulation protocol: Yes   Plan:  Decrease heparin gtt to 1400 units/hr F/u heparin level with AM labs  Daylene Posey, PharmD, [facility] Clinical Pharmacist ED Pharmacist Phone # [phone] 08/12/2022 5:13 PM

## 2022-08-12 NOTE — Consult Note (Signed)
NAME:  Dana Hughes, MRN:  [id number], DOB:  10-03-1980, LOS: 0 ADMISSION DATE:  08/11/2022, CONSULTATION DATE:  4/21 REFERRING MD:  Dr. Clayborne Dana, CHIEF COMPLAINT:  PE   History of Present Illness:  Patient is a 42 year old female with pertinent PMH PE 2022 no longer on eliquis, asthma, anxiety/depression, DMT2 presents to [facility] ED on 4/21 with PE.  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 Was discharged on Eliquis and was supposed to take for 6 months. Patient states she did not complete the full 6 months before she stopped taking. States she has not been on OCPs. Doesn't smoke cigarettes but occasionally marijuana. States she is not very active. No hx of cancer or recent surgical hx. No family coagulation disorders other than aunt who had PE.  Patient states she has asthma and has felt that she has had multiple asthma attacks over the past week.  States she is having a productive cough and increased dyspnea with exertion.  Has been using albuterol nebulizers without much relief also states that her right foot has "turned purple for 30 minutes" that began on 4/20.  Patient came to [facility] ED on 4/21 for further eval.  Upon arrival to [facility] ED, patient sats 100% on room air.  BP 107/82.  Afebrile and sinus tachycardia 120s.  CXR unremarkable.  Troponin 31, D-dimer 221, BNP 127, LA 1.8.  CTA chest bilateral PE with right heart strain; RV/LV 1.31; mild patchy RUL and LLL infiltrates.  Started on heparin. Patient hemodynamically stable and to be admitted to [facility] service.  PCCM consulted for PE.   Pertinent  Medical History   Past Medical History:  Diagnosis Date   Anxiety    Arthritis    Asthma    Bronchitis    BV (bacterial vaginosis)    Candidiasis    Depression    Diabetes mellitus without complication (HCC)    Endometriosis    Headache(784.0)    migraines   Hypertension    no longer takes meds   Migraines    Muscle spasm    Ovarian cyst    Pelvic pain in female  09/29/2015   Scoliosis    Uterine fibroid      Significant Hospital Events: Including procedures, antibiotic start and stop dates in addition to other pertinent events   4/21 admitted w/ PE  Interim History / Subjective:  See above  Objective   Blood pressure 107/82, pulse (!) 123, temperature 98.2 F (36.8 C), temperature source Oral, resp. rate (!) 22, SpO2 100 %.       No intake or output data in the 24 hours ending 08/12/22 0033 There were no vitals filed for this visit.  Examination: General:  NAD HEENT: MM pink/moist; [place] in place Neuro: Aox3; MAE CV: s1s2, tachy 110s, no m/r/g PULM:  dim clear BS bilaterally; [place] 1L GI: soft, bsx4 active  Extremities: warm/dry, no edema; some pain in right ankle w/ mvt Skin: no rashes or lesions    Resolved Hospital Problem list     Assessment & Plan:  Bilateral PE with RV strain -CTA chest bilateral PE with right heart strain; RV/LV 1.31; mild patchy RUL and LLL infiltrates -risk factors are recent PE,  Plan: -Telemetry monitoring -wean [place] for sats >92% -Continuous IV heparin -Trend troponin and LA -Check echo and DVT US -If patient becomes more hemodynamically unstable may need transfer to ICU and IR consult -consider coag studies   Asthma Plan: -prn albuterol  Best Practice (right click and "Reselect all SmartList Selections" daily)   Per primary  Labs   CBC: Recent Labs  Lab 08/11/22 2205  WBC 11.7*  NEUTROABS 7.7  HGB 10.1*  HCT 33.8*  MCV 74.0*  PLT 334    Basic Metabolic Panel: Recent Labs  Lab 08/11/22 2205  NA 136  K 2.9*  CL 106  CO2 18*  GLUCOSE 256*  BUN 6  CREATININE 0.78  CALCIUM 8.7*   GFR: CrCl cannot be calculated (Unknown ideal weight.). Recent Labs  Lab 08/11/22 2205 08/11/22 2314  WBC 11.7*  --   LATICACIDVEN  --  1.8    Liver Function Tests: Recent Labs  Lab 08/11/22 2205  AST 23  ALT 30  ALKPHOS 59  BILITOT 0.4  PROT 7.2  ALBUMIN 3.1*   No results for  input(s): "LIPASE", "AMYLASE" in the last 168 hours. No results for input(s): "AMMONIA" in the last 168 hours.  ABG    Component Value Date/Time   TCO2 24 09/04/2016 1551     Coagulation Profile: No results for input(s): "INR", "PROTIME" in the last 168 hours.  Cardiac Enzymes: No results for input(s): "CKTOTAL", "CKMB", "CKMBINDEX", "TROPONINI" in the last 168 hours.  HbA1C: Hemoglobin A1C  Date/Time Value Ref Range Status  06/12/2022 03:26 PM 12.6 (A) 4.0 - 5.6 % Final  02/21/2022 04:10 PM 9.9 (A) 4.0 - 5.6 % Final   Hgb A1c MFr Bld  Date/Time Value Ref Range Status  06/22/2021 11:22 AM 6.9 (H) 4.8 - 5.6 % Final    Comment:    (NOTE) Pre diabetes:          5.7%-6.4%  Diabetes:              >6.4%  Glycemic control for   <7.0% adults with diabetes   11/12/2019 02:01 PM 11.1 (H) 4.8 - 5.6 % Final    Comment:    (NOTE) Pre diabetes:          5.7%-6.4%  Diabetes:              >6.4%  Glycemic control for   <7.0% adults with diabetes     CBG: No results for input(s): "GLUCAP" in the last 168 hours.  Review of Systems:   Review of Systems  Constitutional:  Negative for fever.  Respiratory:  Positive for shortness of breath. Negative for cough and wheezing.   Cardiovascular:  Negative for chest pain.  Gastrointestinal:  Positive for nausea. Negative for diarrhea and vomiting.  Neurological:  Negative for loss of consciousness.     Past Medical History:  She,  has a past medical history of Anxiety, Arthritis, Asthma, Bronchitis, BV (bacterial vaginosis), Candidiasis, Depression, Diabetes mellitus without complication (HCC), Endometriosis, Headache(784.0), Hypertension, Migraines, Muscle spasm, Ovarian cyst, Pelvic pain in female (09/29/2015), Scoliosis, and Uterine fibroid.   Surgical History:   Past Surgical History:  Procedure Laterality Date   BIOPSY  08/12/2020   Procedure: BIOPSY;  Surgeon: Napoleon Form, MD;  Location: [facility] ENDOSCOPY;  Service:  Endoscopy;;   CHOLECYSTECTOMY     COLONOSCOPY WITH PROPOFOL N/A 08/12/2020   Procedure: COLONOSCOPY WITH PROPOFOL;  Surgeon: Napoleon Form, MD;  Location: [facility] ENDOSCOPY;  Service: Endoscopy;  Laterality: N/A;   ESOPHAGOGASTRODUODENOSCOPY (EGD) WITH PROPOFOL N/A 08/12/2020   Procedure: ESOPHAGOGASTRODUODENOSCOPY (EGD) WITH PROPOFOL;  Surgeon: Napoleon Form, MD;  Location: [facility] ENDOSCOPY;  Service: Endoscopy;  Laterality: N/A;   POLYPECTOMY  08/12/2020   Procedure: POLYPECTOMY;  Surgeon: Marsa Aris  V, MD;  Location: [facility] ENDOSCOPY;  Service: Endoscopy;;   TUBAL LIGATION       Social History:   reports that she has quit smoking. Her smoking use included cigars. She has never used smokeless tobacco. She reports that she does not currently use alcohol. She reports current drug use. Frequency: 7.00 times per week. Drug: Marijuana.   Family History:  Her family history includes Breast cancer in her maternal aunt, paternal aunt, and paternal grandmother; Cancer in her father and paternal grandmother; Colon cancer in her father and paternal aunt; Hypertension in her maternal aunt; Lupus in her paternal grandmother; Migraines in her maternal aunt and mother; Ovarian cancer in her cousin.   Allergies Allergies  Allergen Reactions   Mushroom Extract Complex Anaphylaxis   Latex Itching and Swelling   Penicillins Itching    Has patient had a PCN reaction causing immediate rash, facial/tongue/throat swelling, SOB or lightheadedness with hypotension: No Has patient had a PCN reaction causing severe rash involving mucus membranes or skin necrosis: No Has patient had a PCN reaction that required hospitalization No Has patient had a PCN reaction occurring within the last 10 years: No If all of the above answers are "NO", then may proceed with Cephalosporin use.      Home Medications  Prior to Admission medications   Medication Sig Start Date End Date Taking? Authorizing Provider   acetaminophen (TYLENOL) 500 MG tablet Take 2 tablets (1,000 mg total) by mouth every 6 (six) hours as needed for mild pain (or Fever >/= 101). 08/12/20   Ephriam Knuckles, [name], MD  albuterol (PROVENTIL) (2.5 MG/3ML) 0.083% nebulizer solution Take 3 mLs (2.5 mg total) by nebulization every 6 (six) hours as needed for wheezing or shortness of breath. 07/17/21   Evlyn Kanner, MD  albuterol (VENTOLIN HFA) 108 (90 Base) MCG/ACT inhaler Inhale 2 puffs into the lungs every 6 (six) hours as needed for wheezing or shortness of breath. Reported on 09/29/2015 07/17/21   Evlyn Kanner, MD  AMBULATORY NON FORMULARY MEDICATION Nitroglycerin 0.125% gel - apply a pea size amount to your rectum three times daily x 6-8 weeks. 05/16/22   Tressia Danas, MD  amLODipine (NORVASC) 5 MG tablet Take 1 tablet (5 mg total) by mouth daily. 04/03/22 04/03/23  Morene Crocker, MD  ARIPiprazole (ABILIFY) 20 MG tablet Take 1 tablet (20 mg total) by mouth daily. 06/27/22   Shanna Cisco, NP  artificial tears (LACRILUBE) OINT ophthalmic ointment Place into both eyes at bedtime as needed for dry eyes. 05/02/18   Aviva Kluver B, PA-C  bisacodyl (DULCOLAX) 5 MG EC tablet Take 1 tablet (5 mg total) by mouth daily as needed for moderate constipation. 05/16/22   Tressia Danas, MD  Blood Glucose Monitoring Suppl (TRUE METRIX METER) w/Device KIT use as directed 02/09/22   Ellsworth Lennox, PA-C  diclofenac Sodium (VOLTAREN) 1 % GEL Apply 4 g topically 4 (four) times daily. 07/17/21   Evlyn Kanner, MD  dicyclomine (BENTYL) 10 MG capsule Take 1 capsule (10 mg total) by mouth 4 (four) times daily as needed for spasms (abdominal pain). 05/16/22   Tressia Danas, MD  Dulaglutide (TRULICITY) 0.75 MG/0.5ML SOPN Inject 0.75 mg into the skin once a week. 06/12/22   Evlyn Kanner, MD  fluticasone (FLONASE) 50 MCG/ACT nasal spray PLACE 1 SPRAY INTO BOTH NOSTRILS IN THE MORNING AND AT BEDTIME. Patient taking differently: 2 sprays.  06/20/20 02/28/22  Verdene Lennert, MD  gabapentin (NEURONTIN) 300 MG capsule Take 1 capsule (300 mg total) by  mouth 3 (three) times daily. 06/27/22   Toy Cookey E, NP  glucose blood test strip Check your sugar in the morning before you eat breakfast, and one hour after a meal. 02/09/22   Ellsworth Lennox, PA-C  ibuprofen (ADVIL) 600 MG tablet Take 1 tablet (600 mg total) by mouth 3 (three) times daily. 02/09/22   Ellsworth Lennox, PA-C  insulin aspart (NOVOLOG) 100 UNIT/ML FlexPen Inject 10 Units into the skin 2 (two) times daily with a meal. 06/12/22   Evlyn Kanner, MD  insulin glargine (LANTUS) 100 UNIT/ML Solostar Pen Inject 20 Units into the skin daily at 10pm. 06/12/22   Evlyn Kanner, MD  Insulin Pen Needle 32G X 4 MM MISC Use as directed 4 times daily. 02/09/22   Ellsworth Lennox, PA-C  lamoTRIgine (LAMICTAL) 150 MG tablet Take 1 tablet (150 mg total) by mouth daily. 06/27/22   Toy Cookey E, NP  lidocaine (LIDODERM) 5 % Place 1 patch onto the skin daily. Remove & Discard patch within 12 hours or as directed by MD 06/12/22   Evlyn Kanner, MD  meclizine (ANTIVERT) 25 MG tablet Take 1 tablet (25 mg total) by mouth 3 (three) times daily as needed for dizziness. 05/06/18   Domenick Gong, MD  metFORMIN (GLUCOPHAGE) 1000 MG tablet Take 1 tablet (1,000 mg total) by mouth 2 (two) times daily with a meal. 06/12/22   Evlyn Kanner, MD  metoprolol tartrate (LOPRESSOR) 100 MG tablet TAKE 1 TABLET BY MOUTH 2 HOURS BEFORE MRI. 11/07/21 02/28/22  Maisie Fus, MD  metroNIDAZOLE (METROGEL) 0.75 % vaginal gel Place 1 Applicatorful vaginally at bedtime for 5 days. 03/02/22   Lorriane Shire, MD  mirtazapine (REMERON) 45 MG tablet Take 1 tablet (45 mg total) by mouth at bedtime. 06/27/22   Shanna Cisco, NP  omeprazole (PRILOSEC) 40 MG capsule Take 1 capsule (40 mg total) by mouth daily. 02/09/22   Ellsworth Lennox, PA-C  polyethylene glycol powder (GLYCOLAX/MIRALAX) 17 GM/SCOOP powder Take 127.5  g by mouth daily. 05/16/22   Tressia Danas, MD  senna-docusate (SENOKOT S) 8.6-50 MG tablet Take 1 tablet by mouth daily. 12/14/20   Evlyn Kanner, MD  SUMAtriptan (IMITREX) 50 MG tablet Take 1 tablet (50 mg total) by mouth every 2 (two) hours as needed for migraine. May repeat in 2 hours if headache persists or recurs. Please take no more than 3 tablets (150 mg) in 24 hours. 12/10/20   Dellia Cloud, MD  Tasimelteon (HETLIOZ) 20 MG CAPS Take 20 mg by mouth at bedtime. 06/27/22   Shanna Cisco, NP  TRUEplus Lancets 28G MISC USE AS DIRECTED 02/09/22   Ellsworth Lennox, PA-C         JD Anselm [facility] 08/12/2022, 12:34 AM  Please see [email or web address] for pager details.  From 7A-7P if no response, please call [phone]. After hours, please call ELink [phone].

## 2022-08-12 NOTE — ED Notes (Signed)
ED TO INPATIENT HANDOFF REPORT  ED Nurse Name and Phone #: Leeroy Bock [phone]  S Name/Age/Gender Dana Hughes 42 y.o. female Room/Bed: 008C/008C  Code Status   Code Status: Full Code  Home/SNF/Other Home Patient oriented to: self, place, time, and situation Is this baseline? Yes   Triage Complete: Triage complete  Chief Complaint Pulmonary embolism [I26.99]  Triage Note Patient reports worsening SOB with productive cough /chest congestion for several days . CBG= 306 .    Allergies Allergies  Allergen Reactions   Mushroom Extract Complex Anaphylaxis   Latex Itching and Swelling   Penicillins Itching    Has patient had a PCN reaction causing immediate rash, facial/tongue/throat swelling, SOB or lightheadedness with hypotension: No Has patient had a PCN reaction causing severe rash involving mucus membranes or skin necrosis: No Has patient had a PCN reaction that required hospitalization No Has patient had a PCN reaction occurring within the last 10 years: No If all of the above answers are "NO", then may proceed with Cephalosporin use.     Level of Care/Admitting Diagnosis ED Disposition     ED Disposition  Admit   Condition  --   Comment  Hospital Area: [facility] [[id number]]  Level of Care: Progressive [102]  Admit to Progressive based on following criteria: RESPIRATORY PROBLEMS hypoxemic/hypercapnic respiratory failure that is responsive to NIPPV (BiPAP) or High Flow Nasal Cannula (6-80 lpm). Frequent assessment/intervention, no > Q2 hrs < Q4 hrs, to maintain oxygenation and pulmonary hygiene.  Admit to Progressive based on following criteria: CARDIOVASCULAR & THORACIC of moderate stability with acute coronary syndrome symptoms/low risk myocardial infarction/hypertensive urgency/arrhythmias/heart failure potentially compromising stability and stable post cardiovascular intervention patients.  May admit patient to [facility] or [facility] if  equivalent level of care is available:: No  Covid Evaluation: Asymptomatic - no recent exposure (last 10 days) testing not required  Diagnosis: Pulmonary embolism [241700]  Admitting Physician: Earl Lagos [[id number]]  Attending Physician: Earl Lagos [id number]  Certification:: I certify this patient will need inpatient services for at least 2 midnights  Estimated Length of Stay: 4          B Medical/Surgery History Past Medical History:  Diagnosis Date   Anxiety    Arthritis    Asthma    Bronchitis    BV (bacterial vaginosis)    Candidiasis    Depression    Diabetes mellitus without complication (HCC)    Endometriosis    Headache(784.0)    migraines   Hypertension    no longer takes meds   Migraines    Muscle spasm    Ovarian cyst    Pelvic pain in female 09/29/2015   Scoliosis    Uterine fibroid    Past Surgical History:  Procedure Laterality Date   BIOPSY  08/12/2020   Procedure: BIOPSY;  Surgeon: Napoleon Form, MD;  Location: [facility];  Service: Endoscopy;;   CHOLECYSTECTOMY     COLONOSCOPY WITH PROPOFOL N/A 08/12/2020   Procedure: COLONOSCOPY WITH PROPOFOL;  Surgeon: Napoleon Form, MD;  Location: [facility];  Service: Endoscopy;  Laterality: N/A;   ESOPHAGOGASTRODUODENOSCOPY (EGD) WITH PROPOFOL N/A 08/12/2020   Procedure: ESOPHAGOGASTRODUODENOSCOPY (EGD) WITH PROPOFOL;  Surgeon: Napoleon Form, MD;  Location: [facility];  Service: Endoscopy;  Laterality: N/A;   POLYPECTOMY  08/12/2020   Procedure: POLYPECTOMY;  Surgeon: Napoleon Form, MD;  Location: [facility];  Service: Endoscopy;;   TUBAL LIGATION       A IV Location/Drains/Wounds  Patient Lines/Drains/Airways Status     Active Line/Drains/Airways     Name Placement date Placement time Site Days   Peripheral IV 08/12/22 20 G Left Antecubital 08/12/22  --  Antecubital  less than 1            Intake/Output Last 24 hours  Intake/Output Summary (Last 24 hours)  at 08/12/2022 1452 Last data filed at 08/12/2022 1357 Gross per 24 hour  Intake 100 ml  Output --  Net 100 ml    Labs/Imaging Results for orders placed or performed during the hospital encounter of 08/11/22 (from the past 48 hour(s))  CBC with Differential     Status: Abnormal   Collection Time: 08/11/22 10:05 PM  Result Value Ref Range   WBC 11.7 (H) 4.0 - 10.5 K/uL   RBC 4.57 3.87 - 5.11 MIL/uL   Hemoglobin 10.1 (L) 12.0 - 15.0 g/dL   HCT 16.1 (L) 09.6 - 04.5 %   MCV 74.0 (L) 80.0 - 100.0 fL   MCH 22.1 (L) 26.0 - 34.0 pg   MCHC 29.9 (L) 30.0 - 36.0 g/dL   RDW 40.9 (H) 81.1 - 91.4 %   Platelets 334 150 - 400 K/uL   nRBC 0.0 0.0 - 0.2 %   Neutrophils Relative % 66 %   Neutro Abs 7.7 1.7 - 7.7 K/uL   Lymphocytes Relative 28 %   Lymphs Abs 3.3 0.7 - 4.0 K/uL   Monocytes Relative 5 %   Monocytes Absolute 0.5 0.1 - 1.0 K/uL   Eosinophils Relative 1 %   Eosinophils Absolute 0.1 0.0 - 0.5 K/uL   Basophils Relative 0 %   Basophils Absolute 0.0 0.0 - 0.1 K/uL   Immature Granulocytes 0 %   Abs Immature Granulocytes 0.05 0.00 - 0.07 K/uL    Comment: Performed at [facility], [address], [place], [place] [address]  Comprehensive metabolic panel     Status: Abnormal   Collection Time: 08/11/22 10:05 PM  Result Value Ref Range   Sodium 136 135 - 145 mmol/L   Potassium 2.9 (L) 3.5 - 5.1 mmol/L   Chloride 106 98 - 111 mmol/L   CO2 18 (L) 22 - 32 mmol/L   Glucose, Bld 256 (H) 70 - 99 mg/dL    Comment: Glucose reference range applies only to samples taken after fasting for at least 8 hours.   BUN 6 6 - 20 mg/dL   Creatinine, Ser 6.21 0.44 - 1.00 mg/dL   Calcium 8.7 (L) 8.9 - 10.3 mg/dL   Total Protein 7.2 6.5 - 8.1 g/dL   Albumin 3.1 (L) 3.5 - 5.0 g/dL   AST 23 15 - 41 U/L   ALT 30 0 - 44 U/L   Alkaline Phosphatase 59 38 - 126 U/L   Total Bilirubin 0.4 0.3 - 1.2 mg/dL   GFR, Estimated >30 >86 mL/min    Comment: (NOTE) Calculated using the CKD-EPI Creatinine Equation  (2021)    Anion gap 12 5 - 15    Comment: Performed at [facility], [address] [place], [place] [address]  Troponin I (High Sensitivity)     Status: Abnormal   Collection Time: 08/11/22 10:05 PM  Result Value Ref Range   Troponin I (High Sensitivity) 31 (H) <18 ng/L    Comment: (NOTE) Elevated high sensitivity troponin I (hsTnI) values and significant  changes across serial measurements may suggest ACS but many other  chronic and acute conditions are known to elevate hsTnI  results.  Refer to the "Links" section for chest pain algorithms and additional  guidance. Performed at [facility], [address], [place], [place] [address]   I-Stat beta hCG blood, ED ([facility], [facility], [facility] only)     Status: None   Collection Time: 08/11/22 10:11 PM  Result Value Ref Range   I-stat hCG, quantitative <5.0 <5 mIU/mL   Comment 3            Comment:   GEST. AGE      CONC.  (mIU/mL)   <=1 WEEK        5 - 50     2 WEEKS       50 - 500     3 WEEKS       100 - 10,000     4 WEEKS     1,000 - 30,000        FEMALE AND NON-PREGNANT FEMALE:     LESS THAN 5 mIU/mL   D-dimer, quantitative     Status: Abnormal   Collection Time: 08/11/22 11:14 PM  Result Value Ref Range   D-Dimer, Quant 2.21 (H) 0.00 - 0.50 ug/mL-FEU    Comment: (NOTE) At the manufacturer cut-off value of 0.5 g/mL FEU, this assay has a negative predictive value of 95-100%.This assay is intended for use in conjunction with a clinical pretest probability (PTP) assessment model to exclude pulmonary embolism (PE) and deep venous thrombosis (DVT) in outpatients suspected of PE or DVT. Results should be correlated with clinical presentation. Performed at [facility], [address] [place], [place] [address]   Brain natriuretic peptide     Status: Abnormal   Collection Time: 08/11/22 11:14 PM  Result Value Ref Range   B Natriuretic Peptide 127.1 (H) 0.0 - 100.0 pg/mL    Comment: Performed at [facility],  [address] [place], [place] [address]  Lactic acid, plasma     Status: None   Collection Time: 08/11/22 11:14 PM  Result Value Ref Range   Lactic Acid, Venous 1.8 0.5 - 1.9 mmol/L    Comment: Performed at [facility], [address], [place], [place] [address]  Resp panel by RT-PCR (RSV, Flu A&B, Covid) Anterior Nasal Swab     Status: None   Collection Time: 08/11/22 11:40 PM   Specimen: Anterior Nasal Swab  Result Value Ref Range   SARS Coronavirus 2 by RT PCR NEGATIVE NEGATIVE   Influenza A by PCR NEGATIVE NEGATIVE   Influenza B by PCR NEGATIVE NEGATIVE    Comment: (NOTE) The Xpert Xpress SARS-CoV-2/FLU/RSV plus assay is intended as an aid in the diagnosis of influenza from Nasopharyngeal swab specimens and should not be used as a sole basis for treatment. Nasal washings and aspirates are unacceptable for Xpert Xpress SARS-CoV-2/FLU/RSV testing.  Fact Sheet for Patients: BloggerCourse.com  Fact Sheet for Healthcare Providers: SeriousBroker.it  This test is not yet approved or cleared by the [place] FDA and has been authorized for detection and/or diagnosis of SARS-CoV-2 by FDA under an Emergency Use Authorization (EUA). This EUA will remain in effect (meaning this test can be used) for the duration of the COVID-19 declaration under Section 564(b)(1) of the Act, 21 U.S.C. section 360bbb-3(b)(1), unless the authorization is terminated or revoked.     Resp Syncytial Virus by PCR NEGATIVE NEGATIVE    Comment: (NOTE) Fact Sheet for Patients: BloggerCourse.com  Fact Sheet for Healthcare Providers: SeriousBroker.it  This test is not  yet approved or cleared by the [place] and has been authorized for detection and/or diagnosis of SARS-CoV-2 by FDA under an Emergency Use Authorization (EUA). This EUA will remain in effect (meaning this test can be used) for  the duration of the COVID-19 declaration under Section 564(b)(1) of the Act, 21 U.S.C. section 360bbb-3(b)(1), unless the authorization is terminated or revoked.  Performed at [facility], [address], [place], [place] [address]   Troponin I (High Sensitivity)     Status: Abnormal   Collection Time: 08/12/22 12:36 AM  Result Value Ref Range   Troponin I (High Sensitivity) 33 (H) <18 ng/L    Comment: (NOTE) Elevated high sensitivity troponin I (hsTnI) values and significant  changes across serial measurements may suggest ACS but many other  chronic and acute conditions are known to elevate hsTnI results.  Refer to the "Links" section for chest pain algorithms and additional  guidance. Performed at [facility], [address] [place], [place] [address]   Lactic acid, plasma     Status: None   Collection Time: 08/12/22  2:19 AM  Result Value Ref Range   Lactic Acid, Venous 1.6 0.5 - 1.9 mmol/L    Comment: Performed at [facility], [address], [place], [place] [address]  Magnesium     Status: None   Collection Time: 08/12/22  5:35 AM  Result Value Ref Range   Magnesium 2.0 1.7 - 2.4 mg/dL    Comment: Performed at [facility], [address] [place], [place] [address]  Basic metabolic panel     Status: Abnormal   Collection Time: 08/12/22  5:35 AM  Result Value Ref Range   Sodium 136 135 - 145 mmol/L   Potassium 3.1 (L) 3.5 - 5.1 mmol/L   Chloride 106 98 - 111 mmol/L   CO2 18 (L) 22 - 32 mmol/L   Glucose, Bld 242 (H) 70 - 99 mg/dL    Comment: Glucose reference range applies only to samples taken after fasting for at least 8 hours.   BUN 6 6 - 20 mg/dL   Creatinine, Ser 2.95 0.44 - 1.00 mg/dL   Calcium 8.5 (L) 8.9 - 10.3 mg/dL   GFR, Estimated >62 >13 mL/min    Comment: (NOTE) Calculated using the CKD-EPI Creatinine Equation (2021)    Anion gap 12 5 - 15    Comment: Performed at [facility], [address] [place], [place]  [address]  Technologist smear review     Status: None   Collection Time: 08/12/22  5:35 AM  Result Value Ref Range   WBC MORPHOLOGY MORPHOLOGY UNREMARKABLE    RBC MORPHOLOGY Acanthocytes present    Plt Morphology MORPHOLOGY UNREMARKABLE    Clinical Information thrombocytosis     Comment: Performed at [facility], [address], [place], [place] [address]  Heparin level (unfractionated)     Status: Abnormal   Collection Time: 08/12/22  8:00 AM  Result Value Ref Range   Heparin Unfractionated 0.76 (H) 0.30 - 0.70 IU/mL    Comment: (NOTE) The clinical reportable range upper limit is being lowered to >1.10 to align with the FDA approved guidance for the current laboratory assay.  If heparin results are below expected values, and patient dosage has  been confirmed, suggest follow up testing of antithrombin III levels. Performed at [facility], [address], [place], [place] [address]   CBG monitoring, ED  Status: Abnormal   Collection Time: 08/12/22  8:38 AM  Result Value Ref Range   Glucose-Capillary 327 (H) 70 - 99 mg/dL    Comment: Glucose reference range applies only to samples taken after fasting for at least 8 hours.  CBG monitoring, ED     Status: Abnormal   Collection Time: 08/12/22 12:57 PM  Result Value Ref Range   Glucose-Capillary 288 (H) 70 - 99 mg/dL    Comment: Glucose reference range applies only to samples taken after fasting for at least 8 hours.   ECHOCARDIOGRAM COMPLETE  Result Date: 08/12/2022    ECHOCARDIOGRAM REPORT   Patient Name:   Dana Hughes Date of Exam: 08/12/2022 Medical Rec #:  [id number]        Height:       63.0 in Accession #:    [id number]       Weight:       207.3 lb Date of Birth:  February 15, 1981         BSA:          1.963 m Patient Age:    41 years         BP:           109/28 mmHg Patient Gender: F                HR:           116 bpm. Exam Location:  Inpatient Procedure: 2D Echo, Cardiac Doppler and Color Doppler Indications:     I26.02 Pulmonary embolus  History:        Patient has prior history of Echocardiogram examinations, most                 recent 08/08/2020. Pulmonary embolism, Signs/Symptoms:Dyspnea,                 Shortness of Breath and Chest Pain; Risk Factors:Hypertension,                 Diabetes and Former Smoker.  Sonographer:    Dondra Prader RVT RCS Referring Phys: [id number] [facility] NARENDRA  Sonographer Comments: Patient is obese. Image acquisition challenging due to patient body habitus and Image acquisition challenging due to respiratory motion. IMPRESSIONS  1. Left ventricular ejection fraction, by estimation, is 65 to 70%. The left ventricle has normal function. The left ventricle has no regional wall motion abnormalities. There is mild concentric left ventricular hypertrophy. Indeterminate diastolic filling due to E-A fusion. There is the interventricular septum is flattened in systole and diastole, consistent with right ventricular pressure and volume overload.  2. RV McConnell's Sign suggestive of RV strain. Right ventricular systolic function is moderately reduced. The right ventricular size is moderately enlarged.  3. The mitral valve is normal in structure. No evidence of mitral valve regurgitation. No evidence of mitral stenosis.  4. The aortic valve is tricuspid. Aortic valve regurgitation is not visualized. No aortic stenosis is present. Comparison(s): LV is more vigorous. RV is notably more dilated with worse function. Conclusion(s)/Recommendation(s): RV function worse in with the clinical concern of PE. Consistent with RV strain. Consider PERT team evaluation if clinically indicated. FINDINGS  Left Ventricle: Left ventricular ejection fraction, by estimation, is 65 to 70%. The left ventricle has normal function. The left ventricle has no regional wall motion abnormalities. The left ventricular internal cavity size was small. There is mild concentric left ventricular hypertrophy. The interventricular septum is  flattened in systole and diastole, consistent with right ventricular pressure and  volume overload. Indeterminate diastolic filling due to E-A fusion. Right Ventricle: RV McConnell's Sign suggestive of RV strain. The right ventricular size is moderately enlarged. No increase in right ventricular wall thickness. Right ventricular systolic function is moderately reduced. Left Atrium: Left atrial size was normal in size. Right Atrium: Right atrial size was normal in size. Pericardium: There is no evidence of pericardial effusion. The pericardial effusion is surrounding the apex. Mitral Valve: The mitral valve is normal in structure. No evidence of mitral valve regurgitation. No evidence of mitral valve stenosis. Tricuspid Valve: The tricuspid valve is normal in structure. Tricuspid valve regurgitation is not demonstrated. No evidence of tricuspid stenosis. Aortic Valve: The aortic valve is tricuspid. Aortic valve regurgitation is not visualized. No aortic stenosis is present. Aortic valve mean gradient measures 2.0 mmHg. Aortic valve peak gradient measures 3.3 mmHg. Pulmonic Valve: The pulmonic valve was not well visualized. Pulmonic valve regurgitation is not visualized. No evidence of pulmonic stenosis. Aorta: The aortic root and ascending aorta are structurally normal, with no evidence of dilitation. IAS/Shunts: No atrial level shunt detected by color flow Doppler.  LEFT VENTRICLE PLAX 2D LVIDd:         3.50 cm     Diastology LVIDs:         2.20 cm     LV e' medial:    6.09 cm/s LV PW:         1.20 cm     LV E/e' medial:  7.9 LV IVS:        1.30 cm     LV e' lateral:   12.80 cm/s LVOT diam:     2.40 cm     LV E/e' lateral: 3.8 LVOT Area:     4.52 cm  LV Volumes (MOD) LV vol d, MOD A2C: 64.8 ml LV vol d, MOD A4C: 64.4 ml LV vol s, MOD A4C: 21.7 ml LV SV MOD A4C:     64.4 ml RIGHT VENTRICLE             IVC RV Basal diam:  4.40 cm     IVC diam: 1.80 cm RV S prime:     10.50 cm/s TAPSE (M-mode): 1.6 cm LEFT ATRIUM              Index        RIGHT ATRIUM           Index LA diam:        2.40 cm 1.22 cm/m   RA Area:     16.50 cm LA Vol (A2C):   25.8 ml 13.14 ml/m  RA Volume:   51.70 ml  26.33 ml/m LA Vol (A4C):   14.6 ml 7.44 ml/m LA Biplane Vol: 20.5 ml 10.44 ml/m  AORTIC VALVE              PULMONIC VALVE AV Vmax:      90.70 cm/s  PV Vmax:       0.64 m/s AV Vmean:     61.600 cm/s PV Peak grad:  1.6 mmHg AV VTI:       0.110 m AV Peak Grad: 3.3 mmHg AV Mean Grad: 2.0 mmHg  AORTA Ao Root diam: 3.40 cm Ao Asc diam:  3.20 cm Ao Arch diam: 2.5 cm MITRAL VALVE               TRICUSPID VALVE MV Area (PHT): 3.97 cm    TR Peak grad:   53.9 mmHg MV Decel Time: 191 msec  TR Vmax:        367.00 cm/s MV E velocity: 48.40 cm/s MV A velocity: 80.35 cm/s  SHUNTS MV E/A ratio:  0.60        Systemic Diam: 2.40 cm Riley Lam MD Electronically signed by Riley Lam MD Signature Date/Time: 08/12/2022/12:52:37 PM    Final    VAS [place] LOWER EXTREMITY VENOUS (DVT)  Result Date: 08/12/2022  Lower Venous DVT Study Patient Name:  Dana Hughes  Date of Exam:   08/12/2022 Medical Rec #: [id number]         Accession #:    [id number] Date of Birth: [birth year]          Patient Gender: F Patient Age:   78 years Exam Location:  [facility] Procedure:      VAS [place] LOWER EXTREMITY VENOUS (DVT) Referring Phys: Pia Mau --------------------------------------------------------------------------------  Indications: New Pulmonary embolism.  Risk Factors: History of PE 08/08/20. Comparison Study: Prior negative bilateral LEV done 08/08/20 Performing Technologist: Sherren Kerns RVS  Examination Guidelines: A complete evaluation includes B-mode imaging, spectral Doppler, color Doppler, and power Doppler as needed of all accessible portions of each vessel. Bilateral testing is considered an integral part of a complete examination. Limited examinations for reoccurring indications may be performed as noted. The reflux portion of the exam is  performed with the patient in reverse Trendelenburg.  +--------+---------------+---------+-----------+----------------+-------------+ RIGHT   CompressibilityPhasicitySpontaneityProperties      Thrombus                                                                 Aging         +--------+---------------+---------+-----------+----------------+-------------+ CFV     Full                               pulsatile                                                                waveforms                     +--------+---------------+---------+-----------+----------------+-------------+ SFJ     Full                                                             +--------+---------------+---------+-----------+----------------+-------------+ FV Prox Full                                                             +--------+---------------+---------+-----------+----------------+-------------+ FV Mid  Full  pulsatile                                                                waveforms                     +--------+---------------+---------+-----------+----------------+-------------+ FV      Full                                                             Distal                                                                   +--------+---------------+---------+-----------+----------------+-------------+ PFV     Full                                                             +--------+---------------+---------+-----------+----------------+-------------+ POP     Full                               pulsatile                                                                waveforms                     +--------+---------------+---------+-----------+----------------+-------------+ PTV     Full                                                              +--------+---------------+---------+-----------+----------------+-------------+ PERO    Full                                                             +--------+---------------+---------+-----------+----------------+-------------+   +--------+---------------+---------+-----------+----------------+-------------+ LEFT    CompressibilityPhasicitySpontaneityProperties      Thrombus  Aging         +--------+---------------+---------+-----------+----------------+-------------+ CFV     Full                               pulsatile                                                                waveforms                     +--------+---------------+---------+-----------+----------------+-------------+ SFJ     Full                                                             +--------+---------------+---------+-----------+----------------+-------------+ FV Prox Full                                                             +--------+---------------+---------+-----------+----------------+-------------+ FV Mid  Full                               pulsatile                                                                waveforms                     +--------+---------------+---------+-----------+----------------+-------------+ FV      Full                                                             Distal                                                                   +--------+---------------+---------+-----------+----------------+-------------+ PFV     Full                                                             +--------+---------------+---------+-----------+----------------+-------------+ POP     Full  pulsatile                                                                waveforms                      +--------+---------------+---------+-----------+----------------+-------------+ PTV     Full                                                             +--------+---------------+---------+-----------+----------------+-------------+ PERO    Full                                                             +--------+---------------+---------+-----------+----------------+-------------+     Summary: BILATERAL: - No evidence of deep vein thrombosis seen in the lower extremities, bilaterally. -No evidence of popliteal cyst, bilaterally. RIGHT: pulsatile waveforms  LEFT: Pulsatile waveforms.  *See table(s) above for measurements and observations. Electronically signed by Sherald Hess MD on 08/12/2022 at 10:53:30 AM.    Final    CT Angio Chest PE W and/or Wo Contrast  Result Date: 08/12/2022 CLINICAL DATA:  Chest pain. EXAM: CT ANGIOGRAPHY CHEST WITH CONTRAST TECHNIQUE: Multidetector CT imaging of the chest was performed using the standard protocol during bolus administration of intravenous contrast. Multiplanar CT image reconstructions and MIPs were obtained to evaluate the vascular anatomy. RADIATION DOSE REDUCTION: This exam was performed according to the departmental dose-optimization program which includes automated exposure control, adjustment of the mA and/or kV according to patient size and/or use of iterative reconstruction technique. CONTRAST:  75mL OMNIPAQUE IOHEXOL 350 MG/ML SOLN COMPARISON:  December 18, 2021 FINDINGS: Cardiovascular: Satisfactory opacification of the pulmonary arteries to the segmental level. Marked severity areas of intraluminal low attenuation are seen within the distal aspects of the bilateral pulmonary arteries. There is extension to involve multiple bilateral upper lobe, right middle lobe and bilateral lower lobe branches of the pulmonary arteries. No saddle embolus is seen. Normal heart size. There is right heart strain with an RV/LV ratio of 1.31. No pericardial  effusion. Mediastinum/Nodes: No enlarged mediastinal, hilar, or axillary lymph nodes. Thyroid gland, trachea, and esophagus demonstrate no significant findings. Lungs/Pleura: Mild patchy anteromedial right upper lobe and posteromedial left lower lobe infiltrates are seen. There is no evidence of a pleural effusion or pneumothorax. Upper Abdomen: Surgical clips are seen within the gallbladder fossa. Musculoskeletal: No chest wall abnormality. No acute or significant osseous findings. Review of the MIP images confirms the above findings. IMPRESSION: 1. Marked severity bilateral pulmonary embolism with right heart strain (RV/LV Ratio = 1.31) which has been associated with an increased risk of morbidity and mortality. 2. Mild patchy right upper lobe and left lower lobe infiltrates. 3. Evidence of prior cholecystectomy. Electronically Signed   By: Aram Candela M.D.   On: 08/12/2022 00:16   DG Chest 2 View  Result Date: 08/11/2022 CLINICAL DATA:  Shortness of breath  and productive cough. EXAM: CHEST - 2 VIEW COMPARISON:  PA and lateral 05/01/2022 FINDINGS: The heart size and mediastinal contours are within normal limits. Both lungs are clear. The visualized skeletal structures are unremarkable. There are cholecystectomy clips in the upper abdomen. IMPRESSION: No evidence of acute chest disease or interval changes. Electronically Signed   By: Almira Bar M.D.   On: 08/11/2022 22:39    Pending Labs Unresulted Labs (From admission, onward)     Start     Ordered   08/13/22 0500  Heparin level (unfractionated)  Daily,   R      08/12/22 0128   08/13/22 0500  Basic metabolic panel  Tomorrow morning,   R        08/12/22 1055   08/13/22 0500  Iron and TIBC  Tomorrow morning,   R        08/12/22 1105   08/13/22 0500  Ferritin  Tomorrow morning,   R        08/12/22 1105   08/13/22 0500  CBC  Daily,   R        08/12/22 1127   08/12/22 1600  Heparin level (unfractionated)  Once-Timed,   TIMED         08/12/22 0942   08/12/22 1121  TSH  Once,   R        08/12/22 1120            Vitals/Pain Today's Vitals   08/12/22 1045 08/12/22 1046 08/12/22 1244 08/12/22 1345  BP: (!) 127/90   107/64  Pulse: (!) 116   (!) 117  Resp: (!) 30   (!) 24  Temp:   98.1 F (36.7 C)   TempSrc:   Oral   SpO2: 90%   93%  PainSc:  0-No pain      Isolation Precautions No active isolations  Medications Medications  heparin ADULT infusion 100 units/mL (25000 units/268mL) (1,500 Units/hr Intravenous New Bag/Given 08/12/22 0153)  HYDROmorphone (DILAUDID) injection 0.5-1 mg (1 mg Intravenous Given 08/12/22 0853)  insulin detemir (LEVEMIR) injection 20 Units (has no administration in time range)  acetaminophen (TYLENOL) tablet 1,000 mg (has no administration in time range)  albuterol (PROVENTIL) (2.5 MG/3ML) 0.083% nebulizer solution 2.5 mg (has no administration in time range)  ARIPiprazole (ABILIFY) tablet 20 mg (20 mg Oral Given 08/12/22 1042)  lamoTRIgine (LAMICTAL) tablet 150 mg (150 mg Oral Given 08/12/22 1042)  diclofenac Sodium (VOLTAREN) 1 % topical gel 4 g (has no administration in time range)  polyethylene glycol (MIRALAX / GLYCOLAX) packet 17 g (has no administration in time range)  insulin aspart (novoLOG) injection 0-15 Units (8 Units Subcutaneous Given 08/12/22 1320)  insulin aspart (novoLOG) injection 0-5 Units (has no administration in time range)  arformoterol (BROVANA) nebulizer solution 15 mcg (15 mcg Nebulization Given 08/12/22 1321)  budesonide (PULMICORT) nebulizer solution 0.25 mg (0.25 mg Nebulization Given 08/12/22 1321)  cefTRIAXone (ROCEPHIN) 2 g in sodium chloride 0.9 % 100 mL IVPB (0 g Intravenous Stopped 08/12/22 1357)  azithromycin (ZITHROMAX) 500 mg in sodium chloride 0.9 % 250 mL IVPB (has no administration in time range)  guaiFENesin (MUCINEX) 12 hr tablet 600 mg (600 mg Oral Given 08/12/22 1320)  potassium chloride SA (KLOR-CON M) CR tablet 40 mEq (40 mEq Oral Given 08/12/22  0032)  aspirin chewable tablet 324 mg (324 mg Oral Given 08/12/22 0033)  iohexol (OMNIPAQUE) 350 MG/ML injection 75 mL (75 mLs Intravenous Contrast Given 08/12/22 0008)  heparin bolus via infusion 5,000  Units (5,000 Units Intravenous Bolus from Bag 08/12/22 0157)  potassium chloride SA (KLOR-CON M) CR tablet 40 mEq (40 mEq Oral Given 08/12/22 1320)    Mobility walks     Focused Assessments Pulmonary Assessment Handoff:  Lung sounds: Bilateral Breath Sounds: Clear L Breath Sounds: Clear R Breath Sounds: Clear O2 Device: Room Air O2 Flow Rate (L/min): 3 L/min    R Recommendations: See Admitting Provider Note  Report given to:   Additional Notes:

## 2022-08-12 NOTE — Progress Notes (Signed)
VASCULAR LAB    Bilateral lower extremity venous duplex has been performed.  See CV proc for preliminary results.   Whitni Pasquini, RVT 08/12/2022, 10:11 AM

## 2022-08-12 NOTE — Plan of Care (Signed)
  Problem: Education: Goal: Ability to describe self-care measures that may prevent or decrease complications (Diabetes Survival Skills Education) will improve Outcome: Progressing   Problem: Coping: Goal: Ability to adjust to condition or change in health will improve Outcome: Progressing   Problem: Health Behavior/Discharge Planning: Goal: Ability to identify and utilize available resources and services will improve Outcome: Progressing   Problem: Tissue Perfusion: Goal: Adequacy of tissue perfusion will improve Outcome: Progressing   Problem: Clinical Measurements: Goal: Ability to maintain clinical measurements within normal limits will improve Outcome: Progressing Goal: Diagnostic test results will improve Outcome: Progressing

## 2022-08-13 ENCOUNTER — Other Ambulatory Visit (HOSPITAL_COMMUNITY): Payer: Self-pay

## 2022-08-13 ENCOUNTER — Encounter (HOSPITAL_COMMUNITY): Payer: Self-pay | Admitting: Internal Medicine

## 2022-08-13 ENCOUNTER — Telehealth (HOSPITAL_COMMUNITY): Payer: Self-pay

## 2022-08-13 ENCOUNTER — Inpatient Hospital Stay (HOSPITAL_BASED_OUTPATIENT_CLINIC_OR_DEPARTMENT_OTHER): Payer: Medicaid Other

## 2022-08-13 ENCOUNTER — Telehealth: Payer: Self-pay | Admitting: *Deleted

## 2022-08-13 DIAGNOSIS — I2694 Multiple subsegmental pulmonary emboli without acute cor pulmonale: Secondary | ICD-10-CM | POA: Diagnosis not present

## 2022-08-13 DIAGNOSIS — I2609 Other pulmonary embolism with acute cor pulmonale: Secondary | ICD-10-CM | POA: Diagnosis not present

## 2022-08-13 DIAGNOSIS — I2699 Other pulmonary embolism without acute cor pulmonale: Secondary | ICD-10-CM | POA: Diagnosis not present

## 2022-08-13 HISTORY — PX: IR US GUIDE VASC ACCESS RIGHT: IMG2390

## 2022-08-13 HISTORY — PX: IR INFUSION THROMBOL ARTERIAL INITIAL (MS): IMG5376

## 2022-08-13 LAB — CBC
HCT: 32.6 % — ABNORMAL LOW (ref 36.0–46.0)
HCT: 30.7 % — ABNORMAL LOW (ref 36.0–46.0)
Hemoglobin: 9.4 g/dL — ABNORMAL LOW (ref 12.0–15.0)
Hemoglobin: 9 g/dL — ABNORMAL LOW (ref 12.0–15.0)
MCH: 21.9 pg — ABNORMAL LOW (ref 26.0–34.0)
MCH: 22.1 pg — ABNORMAL LOW (ref 26.0–34.0)
MCHC: 28.8 g/dL — ABNORMAL LOW (ref 30.0–36.0)
MCHC: 29.3 g/dL — ABNORMAL LOW (ref 30.0–36.0)
MCV: 76 fL — ABNORMAL LOW (ref 80.0–100.0)
MCV: 75.4 fL — ABNORMAL LOW (ref 80.0–100.0)
Platelets: 349 10*3/uL (ref 150–400)
Platelets: 284 10*3/uL (ref 150–400)
RBC: 4.29 MIL/uL (ref 3.87–5.11)
RBC: 4.07 MIL/uL (ref 3.87–5.11)
RDW: 17.3 % — ABNORMAL HIGH (ref 11.5–15.5)
RDW: 17.5 % — ABNORMAL HIGH (ref 11.5–15.5)
WBC: 12 10*3/uL — ABNORMAL HIGH (ref 4.0–10.5)
WBC: 10.2 10*3/uL (ref 4.0–10.5)
nRBC: 0 % (ref 0.0–0.2)
nRBC: 0 % (ref 0.0–0.2)

## 2022-08-13 LAB — BASIC METABOLIC PANEL
Anion gap: 9 (ref 5–15)
BUN: 6 mg/dL (ref 6–20)
CO2: 22 mmol/L (ref 22–32)
Calcium: 8.7 mg/dL — ABNORMAL LOW (ref 8.9–10.3)
Chloride: 105 mmol/L (ref 98–111)
Creatinine, Ser: 0.69 mg/dL (ref 0.44–1.00)
GFR, Estimated: >60 mL/min (ref >60)
Glucose, Bld: 168 mg/dL — ABNORMAL HIGH (ref 70–99)
Potassium: 3.3 mmol/L — ABNORMAL LOW (ref 3.5–5.1)
Sodium: 136 mmol/L (ref 135–145)

## 2022-08-13 LAB — GLUCOSE, CAPILLARY
Glucose-Capillary: 160 mg/dL — ABNORMAL HIGH (ref 70–99)
Glucose-Capillary: 224 mg/dL — ABNORMAL HIGH (ref 70–99)
Glucose-Capillary: 254 mg/dL — ABNORMAL HIGH (ref 70–99)
Glucose-Capillary: 135 mg/dL — ABNORMAL HIGH (ref 70–99)

## 2022-08-13 LAB — IRON AND TIBC
Iron: 13 ug/dL — ABNORMAL LOW (ref 28–170)
Saturation Ratios: 3 % — ABNORMAL LOW (ref 10.4–31.8)
TIBC: 417 ug/dL (ref 250–450)
UIBC: 404 ug/dL

## 2022-08-13 LAB — FIBRINOGEN: Fibrinogen: 547 mg/dL — ABNORMAL HIGH (ref 210–475)

## 2022-08-13 LAB — HEPARIN LEVEL (UNFRACTIONATED)
Heparin Unfractionated: 0.63 IU/mL (ref 0.30–0.70)
Heparin Unfractionated: 0.49 IU/mL (ref 0.30–0.70)

## 2022-08-13 LAB — FERRITIN: Ferritin: 8 ng/mL — ABNORMAL LOW (ref 11–307)

## 2022-08-13 LAB — MRSA NEXT GEN BY PCR, NASAL: MRSA by PCR Next Gen: NOT DETECTED

## 2022-08-13 MED ORDER — LIDOCAINE HCL 1 % IJ SOLN
INTRAMUSCULAR | Status: AC
  Filled 2022-08-13: qty 20

## 2022-08-13 MED ORDER — SODIUM CHLORIDE 0.9 % IV SOLN
12.0000 mg | Freq: Once | INTRAVENOUS | Status: AC
  Administered 2022-08-13: 12 mg via INTRAVENOUS
  Filled 2022-08-13: qty 12

## 2022-08-13 MED ORDER — ONDANSETRON HCL 4 MG/2ML IJ SOLN
4.0000 mg | Freq: Four times a day (QID) | INTRAMUSCULAR | Status: DC | PRN
  Administered 2022-08-13: 4 mg via INTRAVENOUS
  Filled 2022-08-13: qty 2

## 2022-08-13 MED ORDER — INSULIN ASPART 100 UNIT/ML IJ SOLN
10.0000 [IU] | Freq: Two times a day (BID) | INTRAMUSCULAR | Status: DC
  Administered 2022-08-14 – 2022-08-15 (×3): 10 [IU] via SUBCUTANEOUS

## 2022-08-13 MED ORDER — SODIUM CHLORIDE 0.9% FLUSH: 3.0000 mL | INTRAVENOUS | Status: DC | PRN

## 2022-08-13 MED ORDER — ONDANSETRON HCL 4 MG/2ML IJ SOLN
4.0000 mg | INTRAMUSCULAR | Status: AC | PRN
  Administered 2022-08-13: 4 mg via INTRAVENOUS
  Filled 2022-08-13: qty 2

## 2022-08-13 MED ORDER — MIDAZOLAM HCL 2 MG/2ML IJ SOLN
INTRAMUSCULAR | Status: AC
  Filled 2022-08-13: qty 6

## 2022-08-13 MED ORDER — POTASSIUM CHLORIDE CRYS ER 20 MEQ PO TBCR
40.0000 meq | EXTENDED_RELEASE_TABLET | Freq: Once | ORAL | Status: AC
  Administered 2022-08-13: 40 meq via ORAL
  Filled 2022-08-13: qty 2

## 2022-08-13 MED ORDER — CHLORHEXIDINE GLUCONATE CLOTH 2 % EX PADS
6.0000 | MEDICATED_PAD | Freq: Every day | CUTANEOUS | Status: DC
  Administered 2022-08-14 – 2022-08-16 (×2): 6 via TOPICAL

## 2022-08-13 MED ORDER — FENTANYL CITRATE (PF) 100 MCG/2ML IJ SOLN
INTRAMUSCULAR | Status: AC
  Filled 2022-08-13: qty 4

## 2022-08-13 MED ORDER — SODIUM CHLORIDE 0.9 % IV SOLN: INTRAVENOUS | Status: DC

## 2022-08-13 MED ORDER — SODIUM CHLORIDE 0.9 % IV SOLN
INTRAVENOUS | Status: AC | PRN
  Administered 2022-08-13: 10 mL/h via INTRAVENOUS

## 2022-08-13 MED ORDER — ONDANSETRON HCL 4 MG/2ML IJ SOLN
4.0000 mg | Freq: Once | INTRAMUSCULAR | Status: AC
  Administered 2022-08-13: 4 mg via INTRAVENOUS
  Filled 2022-08-13: qty 2

## 2022-08-13 MED ORDER — IOHEXOL 300 MG/ML  SOLN: 100.0000 mL | Freq: Once | INTRAMUSCULAR | Status: DC | PRN

## 2022-08-13 MED ORDER — ONDANSETRON HCL 4 MG/2ML IJ SOLN: 4.0000 mg | Freq: Once | INTRAMUSCULAR | Status: DC

## 2022-08-13 MED ORDER — SODIUM CHLORIDE 0.9 % IV SOLN
510.0000 mg | Freq: Once | INTRAVENOUS | Status: AC
  Administered 2022-08-13: 510 mg via INTRAVENOUS
  Filled 2022-08-13: qty 17

## 2022-08-13 MED ORDER — SODIUM CHLORIDE 0.9 % IV SOLN: 250.0000 mL | INTRAVENOUS | Status: DC | PRN

## 2022-08-13 MED ORDER — MIDAZOLAM HCL 2 MG/2ML IJ SOLN
INTRAMUSCULAR | Status: AC | PRN
  Administered 2022-08-13: 1 mg via INTRAVENOUS
  Administered 2022-08-13: .5 mg via INTRAVENOUS

## 2022-08-13 MED ORDER — SODIUM CHLORIDE 0.9% FLUSH
3.0000 mL | Freq: Two times a day (BID) | INTRAVENOUS | Status: DC
  Administered 2022-08-13: 3 mL via INTRAVENOUS

## 2022-08-13 MED ORDER — FENTANYL CITRATE (PF) 100 MCG/2ML IJ SOLN
INTRAMUSCULAR | Status: AC | PRN
  Administered 2022-08-13 (×2): 25 ug via INTRAVENOUS

## 2022-08-13 MED ORDER — ARIPIPRAZOLE 5 MG PO TABS
20.0000 mg | ORAL_TABLET | Freq: Every day | ORAL | Status: DC
  Administered 2022-08-14 – 2022-08-15 (×2): 20 mg via ORAL
  Filled 2022-08-13 (×2): qty 2
  Filled 2022-08-13: qty 4

## 2022-08-13 NOTE — Progress Notes (Signed)
ANTICOAGULATION CONSULT NOTE - Follow Up Consult  Pharmacy Consult for IV heparin Indication: pulmonary embolus  Allergies  Allergen Reactions   Mushroom Extract Complex Anaphylaxis   Latex Itching and Swelling   Penicillins Itching    Has patient had a PCN reaction causing immediate rash, facial/tongue/throat swelling, SOB or lightheadedness with hypotension: No Has patient had a PCN reaction causing severe rash involving mucus membranes or skin necrosis: No Has patient had a PCN reaction that required hospitalization No Has patient had a PCN reaction occurring within the last 10 years: No If all of the above answers are "NO", then may proceed with Cephalosporin use.     Patient Measurements:   Heparin Dosing Weight: 94 kg  Vital Signs: Temp: 98 F (36.7 C) (04/22 0740) Temp Source: Oral (04/22 0740) BP: 107/82 (04/22 0740) Pulse Rate: 113 (04/22 0740)  Labs: Recent Labs    08/11/22 2205 08/12/22 0036 08/12/22 0535 08/12/22 0800 08/12/22 1650 08/13/22 0257  HGB 10.1*  --   --   --   --  9.4*  HCT 33.8*  --   --   --   --  32.6*  PLT 334  --   --   --   --  349  HEPARINUNFRC  --   --   --  0.76* 0.71* 0.63  CREATININE 0.78  --  0.74  --   --  0.69  TROPONINIHS 31* 33*  --   --   --   --      CrCl cannot be calculated (Unknown ideal weight.).   Medications:  Scheduled:   arformoterol  15 mcg Nebulization BID   ARIPiprazole  20 mg Oral Daily   budesonide (PULMICORT) nebulizer solution  0.25 mg Nebulization BID   insulin aspart  0-15 Units Subcutaneous TID WC   insulin aspart  0-5 Units Subcutaneous QHS   insulin aspart  10 Units Subcutaneous BID WC   insulin detemir  20 Units Subcutaneous QHS   lamoTRIgine  150 mg Oral Daily   Infusions:   azithromycin     cefTRIAXone (ROCEPHIN)  IV Stopped (08/12/22 1357)   heparin 1,400 Units/hr (08/12/22 2359)    Assessment: 42 yo female admitted with pulmonary embolism, pharmacy asked to start anticoagulation with  IV heparin. Previously on Eliquis for PE in 2022 - no longer taking.   Heparin level is therapeutic at 0.63, CBC stable.  Goal of Therapy:  Heparin level 0.3-0.7 units/ml Monitor platelets by anticoagulation protocol: Yes   Plan:  Continue heparin 1400 units/h Daily heparin level and CBC  Fredonia Highland, PharmD, [place], [facility] Clinical Pharmacist [phone] Please check AMION for all [facility] Pharmacy numbers 08/13/2022

## 2022-08-13 NOTE — Telephone Encounter (Signed)
Prior Auth for patients medication LIDODERM 5%PATCHES denied by MEDICAID via CoverMyMeds.   Lidocaine 4% patches are available OTC

## 2022-08-13 NOTE — Procedures (Signed)
  Procedure:  Bilateral pulmonary artery catheter directed thrombolysis via R IJ Preprocedure diagnosis: The encounter diagnosis was Other acute pulmonary embolism with acute cor pulmonale. Postprocedure diagnosis: same EBL:    minimal Complications:   none immediate Plan: overnight obs in unit, check L PA pressures in AM, prob catheter/sheath removal in AM  See full dictation in [employer].  Thora Lance MD Main # [phone] Pager  [phone] Mobile [phone]

## 2022-08-13 NOTE — Telephone Encounter (Signed)
Pharmacy Patient Advocate Encounter  Insurance verification completed.    The patient is insured through [facility] MEDICAID   The patient is currently admitted and ran test claims for the following: Eliquis, Pradaxa, Xarelto.  Copays and coinsurance results were relayed to Inpatient clinical team.  This test claim was processed through [facility]- copay amounts may vary at other pharmacies due to pharmacy/plan contracts, or as the patient moves through the different stages of their insurance plan.

## 2022-08-13 NOTE — Telephone Encounter (Signed)
ok 

## 2022-08-13 NOTE — Care Management (Signed)
  Transition of Care [facility]) Screening Note   Patient Details  Name: Dana Hughes Date of Birth: 05-08-1980   Transition of Care [facility]) CM/SW Contact:    Gala Lewandowsky, RN Phone Number: 08/13/2022, 3:30 PM    Transition of Care Department [facility]) has reviewed patient and no TOC needs have been identified at this time. Patient presented for shortness of breath- imaging revealed PE. Patient continues on IV Heparin gtt. Benefits check completed for Eliquis-cost $4.00. We will continue to monitor patient advancement through interdisciplinary progression rounds. If new patient transition needs arise, please place a TOC consult.

## 2022-08-13 NOTE — Evaluation (Signed)
Physical Therapy Evaluation Patient Details Name: Dana Hughes MRN: [id number] DOB: [birth year] Today's Date: 08/13/2022  History of Present Illness  Pt is a 42 year old female who presented to ED on 08/11/22 with worsening lightheadedness and chest pain over the last 4 days in the setting of a recent GI illness. Imaging revealed bilat PEs with R heart strain. PMH: submassive PE in 2022, DM II, HTN, bipolar 2 disorder, iron deficiency anemia   Clinical Impression  Pt admitted with above. Pt was indep PTA living in first floor apartment with boyfriend and 2 grown sons. Pt presenting with noted SOB/DOE with all activity greatly limiting ability to care for herself and limiting ambulation tolerance. Pt's SpO2 dec to 86% on 4LO2 via [place] and HR 139bpm  with 4/4 DOE. Suspect once bilat PEs are resolved pt to improve quickly. Acute PT to cont to follow to promote OOB mobility and activity tolerance to achieve safe mod I level of function.     Recommendations for follow up therapy are one component of a multi-disciplinary discharge planning process, led by the attending physician.  Recommendations may be updated based on patient status, additional functional criteria and insurance authorization.  Follow Up Recommendations       Assistance Recommended at Discharge Intermittent Supervision/Assistance  Patient can return home with the following  A little help with walking and/or transfers;A little help with bathing/dressing/bathroom;Assist for transportation    Equipment Recommendations Rolling walker (2 wheels) (with reassess as pt's breathing improves)  Recommendations for Other Services       Functional Status Assessment Patient has had a recent decline in their functional status and demonstrates the ability to make significant improvements in function in a reasonable and predictable amount of time.     Precautions / Restrictions Precautions Precautions: Fall;Other (comment) Precaution  Comments: SOB, watch SpO2 Restrictions Weight Bearing Restrictions: No      Mobility  Bed Mobility Overal bed mobility: Modified Independent (with HOB elevated to 50 deg)                  Transfers Overall transfer level: Needs assistance Equipment used: None Transfers: Sit to/from Stand Sit to Stand: Min guard           General transfer comment: increased time, min guard for safety and line management    Ambulation/Gait Ambulation/Gait assistance: Min guard Gait Distance (Feet): 10 Feet (x1 to bathroom, 15x1 from bathroom to chair on other side of room) Assistive device: IV Pole Gait Pattern/deviations: Step-through pattern, Decreased stride length, Wide base of support Gait velocity: dec Gait velocity interpretation: <1.31 ft/sec, indicative of household ambulator   General Gait Details: pt with short step height and length, noted SOB, SpO2 at 93% on 4LO2 via [place] with HR at 135-39 when walking to bathroom, once in chair pt's SPO2 dropped to 86% on 4LO2 via [place] and HR in uppers 130s, s/p 2.5 minutes pt returned to 97% on 4LO2 via [place] and HR at 115bpm. pt with c/o of lightheadedness, BP 120/96.  Stairs            Wheelchair Mobility    Modified Rankin (Stroke Patients Only)       Balance Overall balance assessment: Needs assistance Sitting-balance support: Feet supported, No upper extremity supported Sitting balance-Leahy Scale: Good Sitting balance - Comments: pt able to don socks at EOB without LOB, pt with noted SOB with activity   Standing balance support: Single extremity supported, During functional activity, Reliant on assistive device for balance  Standing balance-Leahy Scale: Fair Standing balance comment: pt requires use of IV pole and/or HHA for safe ambulation, pt leaned on sink when washing hands                             Pertinent Vitals/Pain Pain Assessment Pain Assessment: 0-10 Pain Score: 3  Pain Location: chest Pain  Descriptors / Indicators: Heaviness Pain Intervention(s): Monitored during session    Home Living Family/patient expects to be discharged to:: Private residence Living Arrangements: Spouse/significant other (25 and 66 yo sons) Available Help at Discharge: Family;Available 24 hours/day (pt reports her boyfriend isn't working right now) Type of Home: Apartment Home Access: Level entry       Home Layout: One level Home Equipment: Grab bars - tub/shower      Prior Function Prior Level of Function : Independent/Modified Independent             Mobility Comments: no AD use ADLs Comments: indep, driving     Hand Dominance   Dominant Hand: Right    Extremity/Trunk Assessment   Upper Extremity Assessment Upper Extremity Assessment: Generalized weakness    Lower Extremity Assessment Lower Extremity Assessment: Generalized weakness    Cervical / Trunk Assessment Cervical / Trunk Assessment: Normal  Communication   Communication: No difficulties  Cognition Arousal/Alertness: Awake/alert (but sleepy) Behavior During Therapy: Flat affect Overall Cognitive Status: Within Functional Limits for tasks assessed                                          General Comments General comments (skin integrity, edema, etc.): pt indep with tolieting and pericare.    Exercises     Assessment/Plan    PT Assessment Patient needs continued PT services  PT Problem List Decreased strength;Decreased activity tolerance;Decreased balance;Decreased mobility;Decreased knowledge of use of DME;Cardiopulmonary status limiting activity       PT Treatment Interventions DME instruction;Gait training;Functional mobility training;Therapeutic activities;Therapeutic exercise;Neuromuscular re-education;Balance training    PT Goals (Current goals can be found in the Care Plan section)  Acute Rehab PT Goals Patient Stated Goal: get better PT Goal Formulation: With patient Time For  Goal Achievement: 08/27/22 Potential to Achieve Goals: Good    Frequency Min 1X/week     Co-evaluation               AM-PAC PT "6 Clicks" Mobility  Outcome Measure Help needed turning from your back to your side while in a flat bed without using bedrails?: None Help needed moving from lying on your back to sitting on the side of a flat bed without using bedrails?: None Help needed moving to and from a bed to a chair (including a wheelchair)?: None Help needed standing up from a chair using your arms (e.g., wheelchair or bedside chair)?: A Little Help needed to walk in hospital room?: A Little Help needed climbing 3-5 steps with a railing? : A Little 6 Click Score: 21    End of Session Equipment Utilized During Treatment: Oxygen (4LO2 via [place]) Activity Tolerance: Patient limited by fatigue Patient left: in chair;with call bell/phone within reach Nurse Communication: Mobility status PT Visit Diagnosis: Difficulty in walking, not elsewhere classified (R26.2)    Time: 8469-6295 PT Time Calculation (min) (ACUTE ONLY): 25 min   Charges:   PT Evaluation $PT Eval Low Complexity: 1 Low PT Treatments $Gait  Training: 8-22 mins        Lewis Shock, PT, DPT Acute Rehabilitation Services Secure chat preferred Office #: [phone]   Iona Hansen 08/13/2022, 8:37 AM

## 2022-08-13 NOTE — Progress Notes (Addendum)
    Hospital day#1 Subjective:   Summary: Dana Hughes is a 42 yo person living with a prior history of submassive PE in 2022 on prior AC, T2DM, HTN, bipolar II disorder, IDA admitted to the hospital for bilateral pulmonary embolism with evidence of right heart strain on EKG and CTA now being managed with IV heparin ggt  Interim history: Continues to endorse SOB and lightheadedness with ambulation, not improved from admission. Required assistance getting up to her chair. Currently on 3.5 L of oxygen. Does not use oxygen at home. Denies hemoptysis, hematuria, or hematochezia.  No diarrhea.   Objective:  Vital signs in last 24 hours: Vitals:   08/12/22 1740 08/12/22 2010 08/12/22 2020 08/13/22 0338  BP:  103/81  106/80  Pulse:  (!) 109  (!) 111  Resp: Temp:  98.5 F (36.9 C)  98.1 F (36.7 C)  TempSrc: Oral Oral  Oral  SpO2:  91% 94% 92%   Supplemental O2:  SpO2: 92 % O2 Flow Rate (L/min): 3 L/min There were no vitals filed for this visit.  Physical Exam:  Constitutional: Tired appearing woman sitting in chair in mild distress HENT: Moist MM Neck: Supple Cardiovascular: Tachycardic. No murmurs. 2+ radial and DP pulses Pulmonary/Chest: Increased WOB on 3L [place].  CTAB without wheezing Abdominal: Normal BS, soft.  MSK: normal bulk and tone. Trace LE pitting edema.  Neurological: alert & oriented x 3, Skin: warm and dry Psych: Pleasant mood and affect  Other studies: Ferritin 8 Iron 13 Sat ratio 3 Glucose >200-300. 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 CTA with bilateral pulmonary embolism and R heart strain.  Dimer and BMP elevated. TTE LVEF 65-70% with normal function. RV with McConnell's sign , moderate RV enlargement, and moderately reduced  RV function. [facility] consulted; there is question of CTEPH based on TTE findings and superimposed CAP. On day 2 of IV heparin and antibiotic coverage. VS remain stable; however, there is minimal symptom improvement. Will follow PCCM recommendations to  switch to DOAC as there was a question for cdTPA vs 72 HR IV heparin therapy prior to transition.  -Continue on IV heparin ggt -CTX and azithromycin 4/21- -Ambulatory pulse oximetry troday -PCCM consulting  Insulin dependent type 2 diabetes mellitus  06/12/2022 A1c 12.6. Blood sugar 160 this morning and trending down with [place] insulin regimen. -Lantus 20 units QHS -Added 10u BID with meals today -Moderate SSI with meals and bedtime correction  Hypokalemia 2/2 recent GI illness.  -CTM and supplement today  Iron deficiency anemia Thrombocytosis Hgb 10.1 on admission - at baseline. Unclear etiology; presumed to be from endometriosis and uterine fibroids and menorrhagia. Iron studies consistent with iron deficiency anemia with Ferritin 8 Iron 13 sat ratio 3. Hgb 9.4 this AM. Patient is symptomatic but this is multifactorial as above. -IV iron -Outpatient GI and OB-GYN follow up  Schizophrenia Depression/Anxiety -Patient's abilify switched to nightly  HTN Holding home amlodipine 5 mg, metoprolol 100 mg daily given risk for hypotension.  Diet: Carb-Modified VTE: Heparin Code: Full PT/OT recs: pending  Dispo: Anticipated discharge in 1-2 days pending clinical improvement.   Morene Crocker, MD Internal Medicine Resident PGY-1 Please contact the on call pager after 5 pm and on weekends at [phone].

## 2022-08-13 NOTE — Telephone Encounter (Signed)
Called pt.to do previsit and she informed me that she was admitted to the hospital on Saturday with blood clots on both lungs procedure on 09/03/22 was canceled along with pre-visit.

## 2022-08-13 NOTE — Progress Notes (Signed)
Received order this AM to check ambulatory oxygen saturations. When patient worked with PT today, she required 4L of oxygen with ambulation (sats dropped to 86% and HR went up into the 130s even while on 4L). Patient remains symptomatic and short of breath with exertion. Therefore, RN did not perform ambulatory O2 saturation check. If appropriate, this order can be completed when patient's symptoms improve.

## 2022-08-13 NOTE — Progress Notes (Signed)
MEWS Progress Note  Patient Details Name: Dana Hughes MRN: [id number] DOB: 09/06/1980 Today's Date: 08/13/2022   MEWS Flowsheet Documentation:  Assess: MEWS Score Temp: 98 F (36.7 C) BP: 107/82 MAP (mmHg): 91 Pulse Rate: (!) 113 ECG Heart Rate: (!) 113 Resp: 20 Level of Consciousness: Alert SpO2: 96 % O2 Device: Nasal Cannula Patient Activity (if Appropriate): In bed O2 Flow Rate (L/min): 3 L/min Assess: MEWS Score MEWS Temp: 0 MEWS Systolic: 0 MEWS Pulse: 2 MEWS RR: 0 MEWS LOC: 0 MEWS Score: 2 MEWS Score Color: Yellow Assess: SIRS CRITERIA SIRS Temperature : 0 SIRS Respirations : 0 SIRS Pulse: 1 SIRS WBC: 0 SIRS Score Sum : 1 Assess: if the MEWS score is Yellow or Red Were vital signs taken at a resting state?: Yes Focused Assessment: No change from prior assessment Does the patient meet 2 or more of the SIRS criteria?: No MEWS guidelines implemented : Yes, yellow Treat MEWS Interventions: Considered administering scheduled or prn medications/treatments as ordered Take Vital Signs Increase Vital Sign Frequency : Yellow: Q2hr x1, continue Q4hrs until patient remains green for 12hrs Escalate MEWS: Escalate: Yellow: Discuss with charge nurse and consider notifying provider and/or RRT Notify: Charge Nurse/RN Name of Charge Nurse/RN Notified: Ocie Cornfield 08/13/2022, 7:58 AM

## 2022-08-13 NOTE — Progress Notes (Signed)
eLink Physician-Brief Progress Note Patient Name: Dana Hughes DOB: April 26, 1980 MRN: [id number]   Date of Service  08/13/2022  HPI/Events of Note  42 year old female initially presented with bilateral pulmonary emboli with right heart strain transferred to ICU after catheter directed thrombolysis.  Currently nauseous  eICU Interventions  Add on as needed Zofran.     Intervention Category Minor Interventions: Routine modifications to care plan (e.g. PRN medications for pain, fever)  Barney Russomanno 08/13/2022, 11:25 PM

## 2022-08-13 NOTE — Progress Notes (Addendum)
ANTICOAGULATION CONSULT NOTE - Follow Up Consult  Pharmacy Consult for IV heparin Indication: pulmonary embolus  Allergies  Allergen Reactions   Mushroom Extract Complex Anaphylaxis   Latex Itching and Swelling   Penicillins Itching    Has patient had a PCN reaction causing immediate rash, facial/tongue/throat swelling, SOB or lightheadedness with hypotension: No Has patient had a PCN reaction causing severe rash involving mucus membranes or skin necrosis: No Has patient had a PCN reaction that required hospitalization No Has patient had a PCN reaction occurring within the last 10 years: No If all of the above answers are "NO", then may proceed with Cephalosporin use.     Patient Measurements:   Heparin Dosing Weight: 94 kg  Vital Signs: Temp: 97.7 F (36.5 C) (04/22 1930) Temp Source: Oral (04/22 1930) BP: 100/76 (04/22 1900) Pulse Rate: 114 (04/22 1900)  Labs: Recent Labs    08/11/22 2205 08/12/22 0036 08/12/22 0535 08/12/22 0800 08/12/22 1650 08/13/22 0257 08/13/22 2013  HGB 10.1*  --   --   --   --  9.4* 9.0*  HCT 33.8*  --   --   --   --  32.6* 30.7*  PLT 334  --   --   --   --  349 284  HEPARINUNFRC  --   --   --    < > 0.71* 0.63 0.49  CREATININE 0.78  --  0.74  --   --  0.69  --   TROPONINIHS 31* 33*  --   --   --   --   --    < > = values in this interval not displayed.     CrCl cannot be calculated (Unknown ideal weight.).   Assessment: 42 yo female admitted with pulmonary embolism, pharmacy asked to start anticoagulation with IV heparin. Previously on Eliquis for PE in 2022 - no longer taking.   Pt s/p cath directed lysis in IR - continues with alteplase 12 mg running in each cath. Heparin level remains therapeutic (0.49) on infusion at 1400 units/hr. Heparin was not stopped when pt went to IR and no boluses given in IR.  Hgb 9, fibrinogen 547.  Goal of Therapy:  Heparin level 0.3-0.7 units/ml Monitor platelets by anticoagulation protocol: Yes    Plan:  Continue heparin 1400 units/h Q6h fibrinogen, CBC, and heparin level per protocol  Christoper Fabian, PharmD, BCPS Please see amion for complete clinical pharmacist phone list 08/13/2022

## 2022-08-13 NOTE — Consult Note (Signed)
Chief Complaint: Patient was seen in consultation today for PE with right heart strain at the request of Oley Balm  Referring Physician(s): Oley Balm  Supervising Physician: Oley Balm  Patient Status: [facility] - In-pt  History of Present Illness:  Dana Hughes is a 42 y.o. female who presented to [facility] ED 08/11/22 c/o asthma exacerbation. She reported several recent attacks with productive cough and DOE. ED workup found elevated troponin and D-dimer. CT scan showed moderate clot burden. She was admitted for PE. She has been referred to IR for PE lysis that was approved by Dr. Deanne Coffer.   Pt denies edema, abdominal pain or HA.  She endorses loss of appetite, fatigue, fever, cough, SOB, N/V, dizziness and weakness.  She reports last PO intake was around noon with a few bites of spaghetti and water.    Past Medical History:  Diagnosis Date   Anxiety    Arthritis    Asthma    Bronchitis    BV (bacterial vaginosis)    Candidiasis    Depression    Diabetes mellitus without complication (HCC)    Endometriosis    Headache(784.0)    migraines   Hypertension    no longer takes meds   Migraines    Muscle spasm    Ovarian cyst    Pelvic pain in female 09/29/2015   Scoliosis    Uterine fibroid     Past Surgical History:  Procedure Laterality Date   BIOPSY  08/12/2020   Procedure: BIOPSY;  Surgeon: Napoleon Form, MD;  Location: [facility] ENDOSCOPY;  Service: Endoscopy;;   CHOLECYSTECTOMY     COLONOSCOPY WITH PROPOFOL N/A 08/12/2020   Procedure: COLONOSCOPY WITH PROPOFOL;  Surgeon: Napoleon Form, MD;  Location: [facility] ENDOSCOPY;  Service: Endoscopy;  Laterality: N/A;   ESOPHAGOGASTRODUODENOSCOPY (EGD) WITH PROPOFOL N/A 08/12/2020   Procedure: ESOPHAGOGASTRODUODENOSCOPY (EGD) WITH PROPOFOL;  Surgeon: Napoleon Form, MD;  Location: [facility] ENDOSCOPY;  Service: Endoscopy;  Laterality: N/A;   POLYPECTOMY  08/12/2020   Procedure: POLYPECTOMY;  Surgeon: Napoleon Form, MD;  Location: [facility] ENDOSCOPY;  Service: Endoscopy;;   TUBAL LIGATION      Allergies: Mushroom extract complex, Latex, and Penicillins  Medications: Prior to Admission medications   Medication Sig Start Date End Date Taking? Authorizing Provider  acetaminophen (TYLENOL) 500 MG tablet Take 2 tablets (1,000 mg total) by mouth every 6 (six) hours as needed for mild pain (or Fever >/= 101). 08/12/20  Yes Christian, [name], MD  albuterol (PROVENTIL) (2.5 MG/3ML) 0.083% nebulizer solution Take 3 mLs (2.5 mg total) by nebulization every 6 (six) hours as needed for wheezing or shortness of breath. 07/17/21  Yes Evlyn Kanner, MD  albuterol (VENTOLIN HFA) 108 (90 Base) MCG/ACT inhaler Inhale 2 puffs into the lungs every 6 (six) hours as needed for wheezing or shortness of breath. Reported on 09/29/2015 07/17/21  Yes Evlyn Kanner, MD  amLODipine (NORVASC) 5 MG tablet Take 1 tablet (5 mg total) by mouth daily. 04/03/22 04/03/23 Yes Morene Crocker, MD  ARIPiprazole (ABILIFY) 20 MG tablet Take 1 tablet (20 mg total) by mouth daily. 06/27/22  Yes Toy Cookey E, NP  artificial tears (LACRILUBE) OINT ophthalmic ointment Place into both eyes at bedtime as needed for dry eyes. 05/02/18  Yes [place] Scrape, Alyssa B, PA-C  bisacodyl (DULCOLAX) 5 MG EC tablet Take 1 tablet (5 mg total) by mouth daily as needed for moderate constipation. 05/16/22  Yes Tressia Danas, MD  diclofenac Sodium (VOLTAREN) 1 % GEL Apply  4 g topically 4 (four) times daily. 07/17/21  Yes Evlyn Kanner, MD  dicyclomine (BENTYL) 10 MG capsule Take 1 capsule (10 mg total) by mouth 4 (four) times daily as needed for spasms (abdominal pain). 05/16/22  Yes Tressia Danas, MD  Dulaglutide (TRULICITY) 0.75 MG/0.5ML SOPN Inject 0.75 mg into the skin once a week. 06/12/22  Yes Evlyn Kanner, MD  fluticasone (FLONASE) 50 MCG/ACT nasal spray PLACE 1 SPRAY INTO BOTH NOSTRILS IN THE MORNING AND AT BEDTIME. Patient taking differently: 2  sprays. 06/20/20 08/12/22 Yes Verdene Lennert, MD  gabapentin (NEURONTIN) 300 MG capsule Take 1 capsule (300 mg total) by mouth 3 (three) times daily. 06/27/22  Yes Toy Cookey E, NP  insulin aspart (NOVOLOG) 100 UNIT/ML FlexPen Inject 10 Units into the skin 2 (two) times daily with a meal. 06/12/22  Yes Evlyn Kanner, MD  insulin glargine (LANTUS) 100 UNIT/ML Solostar Pen Inject 20 Units into the skin daily at 10pm. 06/12/22  Yes Evlyn Kanner, MD  lamoTRIgine (LAMICTAL) 150 MG tablet Take 1 tablet (150 mg total) by mouth daily. 06/27/22  Yes Toy Cookey E, NP  meclizine (ANTIVERT) 25 MG tablet Take 1 tablet (25 mg total) by mouth 3 (three) times daily as needed for dizziness. 05/06/18  Yes Domenick Gong, MD  metFORMIN (GLUCOPHAGE) 1000 MG tablet Take 1 tablet (1,000 mg total) by mouth 2 (two) times daily with a meal. 06/12/22  Yes Evlyn Kanner, MD  metroNIDAZOLE (METROGEL) 0.75 % vaginal gel Place 1 Applicatorful vaginally at bedtime for 5 days. 03/02/22  Yes Lorriane Shire, MD  mirtazapine (REMERON) 45 MG tablet Take 1 tablet (45 mg total) by mouth at bedtime. 06/27/22  Yes Toy Cookey E, NP  omeprazole (PRILOSEC) 40 MG capsule Take 1 capsule (40 mg total) by mouth daily. 02/09/22  Yes Ellsworth Lennox, PA-C  polyethylene glycol powder (GLYCOLAX/MIRALAX) 17 GM/SCOOP powder Take 127.5 g by mouth daily. 05/16/22  Yes Tressia Danas, MD  senna-docusate (SENOKOT S) 8.6-50 MG tablet Take 1 tablet by mouth daily. 12/14/20  Yes Evlyn Kanner, MD  SUMAtriptan (IMITREX) 50 MG tablet Take 1 tablet (50 mg total) by mouth every 2 (two) hours as needed for migraine. May repeat in 2 hours if headache persists or recurs. Please take no more than 3 tablets (150 mg) in 24 hours. 12/10/20  Yes Dellia Cloud, MD  Tasimelteon (HETLIOZ) 20 MG CAPS Take 20 mg by mouth at bedtime. 06/27/22  Yes Toy Cookey E, NP  AMBULATORY NON FORMULARY MEDICATION Nitroglycerin 0.125% gel - apply a pea size  amount to your rectum three times daily x 6-8 weeks. Patient not taking: Reported on 08/12/2022 05/16/22   Tressia Danas, MD  Blood Glucose Monitoring Suppl (TRUE METRIX METER) w/Device KIT use as directed 02/09/22   Ellsworth Lennox, PA-C  glucose blood test strip Check your sugar in the morning before you eat breakfast, and one hour after a meal. 02/09/22   Ellsworth Lennox, PA-C  ibuprofen (ADVIL) 600 MG tablet Take 1 tablet (600 mg total) by mouth 3 (three) times daily. Patient not taking: Reported on 08/12/2022 02/09/22   Ellsworth Lennox, PA-C  Insulin Pen Needle 32G X 4 MM MISC Use as directed 4 times daily. 02/09/22   Ellsworth Lennox, PA-C  lidocaine (LIDODERM) 5 % Place 1 patch onto the skin daily. Remove & Discard patch within 12 hours or as directed by MD Patient not taking: Reported on 08/12/2022 06/12/22   Evlyn Kanner, MD  metoprolol tartrate (LOPRESSOR) 100 MG tablet TAKE 1 TABLET  BY MOUTH 2 HOURS BEFORE MRI. Patient not taking: Reported on 08/12/2022 11/07/21 02/28/22  Maisie Fus, MD  TRUEplus Lancets 28G MISC USE AS DIRECTED 02/09/22   Ellsworth Lennox, PA-C     Family History  Problem Relation Age of Onset   Migraines Mother    Cancer Father    Colon cancer Father    Lupus Paternal Grandmother    Cancer Paternal Grandmother    Breast cancer Paternal Grandmother    Migraines Maternal Aunt    Hypertension Maternal Aunt    Breast cancer Maternal Aunt    Colon cancer Paternal Aunt    Breast cancer Paternal Aunt    Ovarian cancer Cousin     Social History   Socioeconomic History   Marital status: Single    Spouse name: Not on file   Number of children: Not on file   Years of education: Not on file   Highest education level: Not on file  Occupational History   Not on file  Tobacco Use   Smoking status: Former    Types: Cigars   Smokeless tobacco: Never   Tobacco comments:    1-2 cigars/week  Vaping Use   Vaping Use: Never used  Substance and Sexual Activity   Alcohol  use: Not Currently    Comment: occasional   Drug use: Yes    Frequency: 7.0 times per week    Types: Marijuana    Comment: 1  blunts a week, last use last night   Sexual activity: Yes    Birth control/protection: Condom, Surgical  Other Topics Concern   Not on file  Social History Narrative   Not on file   Social Determinants of Health   Financial Resource Strain: Medium Risk (11/08/2021)   Overall Financial Resource Strain (CARDIA)    Difficulty of Paying Living Expenses: Somewhat hard  Food Insecurity: Food Insecurity Present (08/12/2022)   Hunger Vital Sign    Worried About Running Out of Food in the Last Year: Sometimes true    Ran Out of Food in the Last Year: Never true  Transportation Needs: No Transportation Needs (08/12/2022)   PRAPARE - Administrator, Civil Service (Medical): No    Lack of Transportation (Non-Medical): No  Physical Activity: Not on file  Stress: Not on file  Social Connections: Not on file    Review of Systems: A 12 point ROS discussed and pertinent positives are indicated in the HPI above.  All other systems are negative.  Review of Systems  Constitutional:  Positive for appetite change, fatigue and fever.  Respiratory:  Positive for cough and shortness of breath.   Cardiovascular:  Positive for chest pain. Negative for leg swelling.  Gastrointestinal:  Positive for nausea and vomiting. Negative for abdominal pain.  Neurological:  Positive for dizziness and weakness. Negative for headaches.    Vital Signs: BP 103/79 (BP Location: Right Arm)   Pulse (!) 110   Temp 98.2 F (36.8 C) (Oral)   Resp 18   SpO2 95%     Physical Exam Vitals reviewed.  Constitutional:      General: She is not in acute distress.    Appearance: She is ill-appearing.  HENT:     Head: Normocephalic and atraumatic.     Nose:     Comments: O2 via [place]    Mouth/Throat:     Mouth: Mucous membranes are dry.     Pharynx: Oropharynx is clear.  Eyes:  Extraocular Movements: Extraocular movements intact.     Pupils: Pupils are equal, round, and reactive to light.  Cardiovascular:     Rate and Rhythm: Regular rhythm. Tachycardia present.     Pulses: Normal pulses.     Heart sounds: Normal heart sounds.  Pulmonary:     Effort: Pulmonary effort is normal. No respiratory distress.     Breath sounds: Normal breath sounds.  Abdominal:     General: Bowel sounds are normal. There is no distension.     Palpations: Abdomen is soft.     Tenderness: There is no abdominal tenderness. There is no guarding.  Musculoskeletal:     Right lower leg: No edema.     Left lower leg: No edema.  Skin:    General: Skin is warm and dry.  Neurological:     Mental Status: She is alert and oriented to person, place, and time.  Psychiatric:        Mood and Affect: Mood normal.        Behavior: Behavior normal.        Thought Content: Thought content normal.        Judgment: Judgment normal.     Imaging: ECHOCARDIOGRAM COMPLETE  Result Date: 08/12/2022    ECHOCARDIOGRAM REPORT   Patient Name:   Dana Hughes Date of Exam: 08/12/2022 Medical Rec #:  [id number]        Height:       63.0 in Accession #:    [id number]       Weight:       207.3 lb Date of Birth:  1980/04/26         BSA:          1.963 m Patient Age:    41 years         BP:           109/28 mmHg Patient Gender: F                HR:           116 bpm. Exam Location:  Inpatient Procedure: 2D Echo, Cardiac Doppler and Color Doppler Indications:    I26.02 Pulmonary embolus  History:        Patient has prior history of Echocardiogram examinations, most                 recent 08/08/2020. Pulmonary embolism, Signs/Symptoms:Dyspnea,                 Shortness of Breath and Chest Pain; Risk Factors:Hypertension,                 Diabetes and Former Smoker.  Sonographer:    Dondra Prader RVT RCS Referring Phys: [id number] [facility] NARENDRA  Sonographer Comments: Patient is obese. Image acquisition challenging due to  patient body habitus and Image acquisition challenging due to respiratory motion. IMPRESSIONS  1. Left ventricular ejection fraction, by estimation, is 65 to 70%. The left ventricle has normal function. The left ventricle has no regional wall motion abnormalities. There is mild concentric left ventricular hypertrophy. Indeterminate diastolic filling due to E-A fusion. There is the interventricular septum is flattened in systole and diastole, consistent with right ventricular pressure and volume overload.  2. RV McConnell's Sign suggestive of RV strain. Right ventricular systolic function is moderately reduced. The right ventricular size is moderately enlarged.  3. The mitral valve is normal in structure. No evidence of mitral valve regurgitation. No evidence of mitral stenosis.  4. The aortic valve is tricuspid. Aortic valve regurgitation is not visualized. No aortic stenosis is present. Comparison(s): LV is more vigorous. RV is notably more dilated with worse function. Conclusion(s)/Recommendation(s): RV function worse in with the clinical concern of PE. Consistent with RV strain. Consider PERT team evaluation if clinically indicated. FINDINGS  Left Ventricle: Left ventricular ejection fraction, by estimation, is 65 to 70%. The left ventricle has normal function. The left ventricle has no regional wall motion abnormalities. The left ventricular internal cavity size was small. There is mild concentric left ventricular hypertrophy. The interventricular septum is flattened in systole and diastole, consistent with right ventricular pressure and volume overload. Indeterminate diastolic filling due to E-A fusion. Right Ventricle: RV McConnell's Sign suggestive of RV strain. The right ventricular size is moderately enlarged. No increase in right ventricular wall thickness. Right ventricular systolic function is moderately reduced. Left Atrium: Left atrial size was normal in size. Right Atrium: Right atrial size was normal  in size. Pericardium: There is no evidence of pericardial effusion. The pericardial effusion is surrounding the apex. Mitral Valve: The mitral valve is normal in structure. No evidence of mitral valve regurgitation. No evidence of mitral valve stenosis. Tricuspid Valve: The tricuspid valve is normal in structure. Tricuspid valve regurgitation is not demonstrated. No evidence of tricuspid stenosis. Aortic Valve: The aortic valve is tricuspid. Aortic valve regurgitation is not visualized. No aortic stenosis is present. Aortic valve mean gradient measures 2.0 mmHg. Aortic valve peak gradient measures 3.3 mmHg. Pulmonic Valve: The pulmonic valve was not well visualized. Pulmonic valve regurgitation is not visualized. No evidence of pulmonic stenosis. Aorta: The aortic root and ascending aorta are structurally normal, with no evidence of dilitation. IAS/Shunts: No atrial level shunt detected by color flow Doppler.  LEFT VENTRICLE PLAX 2D LVIDd:         3.50 cm     Diastology LVIDs:         2.20 cm     LV e' medial:    6.09 cm/s LV PW:         1.20 cm     LV E/e' medial:  7.9 LV IVS:        1.30 cm     LV e' lateral:   12.80 cm/s LVOT diam:     2.40 cm     LV E/e' lateral: 3.8 LVOT Area:     4.52 cm  LV Volumes (MOD) LV vol d, MOD A2C: 64.8 ml LV vol d, MOD A4C: 64.4 ml LV vol s, MOD A4C: 21.7 ml LV SV MOD A4C:     64.4 ml RIGHT VENTRICLE             IVC RV Basal diam:  4.40 cm     IVC diam: 1.80 cm RV S prime:     10.50 cm/s TAPSE (M-mode): 1.6 cm LEFT ATRIUM             Index        RIGHT ATRIUM           Index LA diam:        2.40 cm 1.22 cm/m   RA Area:     16.50 cm LA Vol (A2C):   25.8 ml 13.14 ml/m  RA Volume:   51.70 ml  26.33 ml/m LA Vol (A4C):   14.6 ml 7.44 ml/m LA Biplane Vol: 20.5 ml 10.44 ml/m  AORTIC VALVE              PULMONIC  VALVE AV Vmax:      90.70 cm/s  PV Vmax:       0.64 m/s AV Vmean:     61.600 cm/s PV Peak grad:  1.6 mmHg AV VTI:       0.110 m AV Peak Grad: 3.3 mmHg AV Mean Grad: 2.0 mmHg   AORTA Ao Root diam: 3.40 cm Ao Asc diam:  3.20 cm Ao Arch diam: 2.5 cm MITRAL VALVE               TRICUSPID VALVE MV Area (PHT): 3.97 cm    TR Peak grad:   53.9 mmHg MV Decel Time: 191 msec    TR Vmax:        367.00 cm/s MV E velocity: 48.40 cm/s MV A velocity: 80.35 cm/s  SHUNTS MV E/A ratio:  0.60        Systemic Diam: 2.40 cm Riley Lam MD Electronically signed by Riley Lam MD Signature Date/Time: 08/12/2022/12:52:37 PM    Final    VAS [place] LOWER EXTREMITY VENOUS (DVT)  Result Date: 08/12/2022  Lower Venous DVT Study Patient Name:  Dana Hughes  Date of Exam:   08/12/2022 Medical Rec #: [id number]         Accession #:    [id number] Date of Birth: 07/25/1980          Patient Gender: F Patient Age:   20 years Exam Location:  [facility] Procedure:      VAS [place] LOWER EXTREMITY VENOUS (DVT) Referring Phys: Pia Mau --------------------------------------------------------------------------------  Indications: New Pulmonary embolism.  Risk Factors: History of PE 08/08/20. Comparison Study: Prior negative bilateral LEV done 08/08/20 Performing Technologist: Sherren Kerns RVS  Examination Guidelines: A complete evaluation includes B-mode imaging, spectral Doppler, color Doppler, and power Doppler as needed of all accessible portions of each vessel. Bilateral testing is considered an integral part of a complete examination. Limited examinations for reoccurring indications may be performed as noted. The reflux portion of the exam is performed with the patient in reverse Trendelenburg.  +--------+---------------+---------+-----------+----------------+-------------+ RIGHT   CompressibilityPhasicitySpontaneityProperties      Thrombus                                                                 Aging         +--------+---------------+---------+-----------+----------------+-------------+ CFV     Full                               pulsatile                                                                 waveforms                     +--------+---------------+---------+-----------+----------------+-------------+ SFJ     Full                                                             +--------+---------------+---------+-----------+----------------+-------------+  FV Prox Full                                                             +--------+---------------+---------+-----------+----------------+-------------+ FV Mid  Full                               pulsatile                                                                waveforms                     +--------+---------------+---------+-----------+----------------+-------------+ FV      Full                                                             Distal                                                                   +--------+---------------+---------+-----------+----------------+-------------+ PFV     Full                                                             +--------+---------------+---------+-----------+----------------+-------------+ POP     Full                               pulsatile                                                                waveforms                     +--------+---------------+---------+-----------+----------------+-------------+ PTV     Full                                                             +--------+---------------+---------+-----------+----------------+-------------+ PERO    Full                                                             +--------+---------------+---------+-----------+----------------+-------------+   +--------+---------------+---------+-----------+----------------+-------------+  LEFT    CompressibilityPhasicitySpontaneityProperties      Thrombus                                                                 Aging          +--------+---------------+---------+-----------+----------------+-------------+ CFV     Full                               pulsatile                                                                waveforms                     +--------+---------------+---------+-----------+----------------+-------------+ SFJ     Full                                                             +--------+---------------+---------+-----------+----------------+-------------+ FV Prox Full                                                             +--------+---------------+---------+-----------+----------------+-------------+ FV Mid  Full                               pulsatile                                                                waveforms                     +--------+---------------+---------+-----------+----------------+-------------+ FV      Full                                                             Distal                                                                   +--------+---------------+---------+-----------+----------------+-------------+ PFV     Full                                                             +--------+---------------+---------+-----------+----------------+-------------+  POP     Full                               pulsatile                                                                waveforms                     +--------+---------------+---------+-----------+----------------+-------------+ PTV     Full                                                             +--------+---------------+---------+-----------+----------------+-------------+ PERO    Full                                                             +--------+---------------+---------+-----------+----------------+-------------+     Summary: BILATERAL: - No evidence of deep vein thrombosis seen in the lower extremities, bilaterally. -No  evidence of popliteal cyst, bilaterally. RIGHT: pulsatile waveforms  LEFT: Pulsatile waveforms.  *See table(s) above for measurements and observations. Electronically signed by Sherald Hess MD on 08/12/2022 at 10:53:30 AM.    Final    CT Angio Chest PE W and/or Wo Contrast  Result Date: 08/12/2022 CLINICAL DATA:  Chest pain. EXAM: CT ANGIOGRAPHY CHEST WITH CONTRAST TECHNIQUE: Multidetector CT imaging of the chest was performed using the standard protocol during bolus administration of intravenous contrast. Multiplanar CT image reconstructions and MIPs were obtained to evaluate the vascular anatomy. RADIATION DOSE REDUCTION: This exam was performed according to the departmental dose-optimization program which includes automated exposure control, adjustment of the mA and/or kV according to patient size and/or use of iterative reconstruction technique. CONTRAST:  75mL OMNIPAQUE IOHEXOL 350 MG/ML SOLN COMPARISON:  December 18, 2021 FINDINGS: Cardiovascular: Satisfactory opacification of the pulmonary arteries to the segmental level. Marked severity areas of intraluminal low attenuation are seen within the distal aspects of the bilateral pulmonary arteries. There is extension to involve multiple bilateral upper lobe, right middle lobe and bilateral lower lobe branches of the pulmonary arteries. No saddle embolus is seen. Normal heart size. There is right heart strain with an RV/LV ratio of 1.31. No pericardial effusion. Mediastinum/Nodes: No enlarged mediastinal, hilar, or axillary lymph nodes. Thyroid gland, trachea, and esophagus demonstrate no significant findings. Lungs/Pleura: Mild patchy anteromedial right upper lobe and posteromedial left lower lobe infiltrates are seen. There is no evidence of a pleural effusion or pneumothorax. Upper Abdomen: Surgical clips are seen within the gallbladder fossa. Musculoskeletal: No chest wall abnormality. No acute or significant osseous findings. Review of the MIP images  confirms the above findings. IMPRESSION: 1. Marked severity bilateral pulmonary embolism with right heart strain (RV/LV Ratio = 1.31) which has been associated with an increased risk of morbidity and mortality. 2. Mild patchy right upper lobe and left lower lobe  infiltrates. 3. Evidence of prior cholecystectomy. Electronically Signed   By: Aram Candela M.D.   On: 08/12/2022 00:16   DG Chest 2 View  Result Date: 08/11/2022 CLINICAL DATA:  Shortness of breath and productive cough. EXAM: CHEST - 2 VIEW COMPARISON:  PA and lateral 05/01/2022 FINDINGS: The heart size and mediastinal contours are within normal limits. Both lungs are clear. The visualized skeletal structures are unremarkable. There are cholecystectomy clips in the upper abdomen. IMPRESSION: No evidence of acute chest disease or interval changes. Electronically Signed   By: Almira Bar M.D.   On: 08/11/2022 22:39    Labs:  CBC: Recent Labs    05/01/22 1508 05/16/22 1543 08/11/22 2205 08/13/22 0257  WBC 8.9 9.8 11.7* 12.0*  HGB 9.7* 10.5* 10.1* 9.4*  HCT 33.4* 34.3* 33.8* 32.6*  PLT 404* 481.0* 334 349    COAGS: No results for input(s): "INR", "APTT" in the last 8760 hours.  BMP: Recent Labs    05/01/22 1508 08/11/22 2205 08/12/22 0535 08/13/22 0257  NA 133* 136 136 136  K 3.5 2.9* 3.1* 3.3*  CL 103 106 106 105  CO2 21* 18* 18* 22  GLUCOSE 269* 256* 242* 168*  BUN <5* CALCIUM 8.5* 8.7* 8.5* 8.7*  CREATININE 0.64 0.78 0.74 0.69  GFRNONAA >60 >60 >60 >60    LIVER FUNCTION TESTS: Recent Labs    05/01/22 1845 08/11/22 2205  BILITOT 0.2* 0.4  AST 26 23  ALT 26 30  ALKPHOS 61 59  PROT 6.8 7.2  ALBUMIN 3.2* 3.1*    TUMOR MARKERS: No results for input(s): "AFPTM", "CEA", "CA199", "CHROMGRNA" in the last 8760 hours.  Assessment and Plan:  42 yo female with PMHx significant for submassive PE 2022 no longer on Eliquis presents to IR catheter directed thrombolysis for newly diagnosed PE.    Pt  noted to be sitting upright in wheelchair.  She is A&O, calm and pleasant.  She is in no distress.   Risks and benefits discussed with the patient including, but not limited to bleeding, possible life threatening bleeding and need for blood product transfusion, vascular injury, stroke, contrast induced renal failure, limb loss and infection.  All of the patient's questions were answered, patient is agreeable to proceed. Consent signed and in chart.  Thank you for this interesting consult.  I greatly enjoyed meeting KALANI BARAY and look forward to participating in their care.  A copy of this report was sent to the requesting provider on this date.  Electronically Signed: Shon Hough, NP 08/13/2022, 3:57 PM   I spent a total of 20 minutes  in face to face in clinical consultation, greater than 50% of which was counseling/coordinating care for pulmonary emboli.

## 2022-08-13 NOTE — Inpatient Diabetes Management (Signed)
Inpatient Diabetes Program Recommendations  AACE/ADA: New Consensus Statement on Inpatient Glycemic Control (2015)  Target Ranges:  Prepandial:   less than 140 mg/dL      Peak postprandial:   less than 180 mg/dL (1-2 hours)      Critically ill patients:  140 - 180 mg/dL   Lab Results  Component Value Date   GLUCAP 224 (H) 08/13/2022   HGBA1C 12.6 (A) 06/12/2022    Review of Glycemic Control  Latest Reference Range & Units 08/12/22 08:38 08/12/22 12:57 08/12/22 17:35 08/12/22 21:44 08/13/22 07:41 08/13/22 11:56  Glucose-Capillary 70 - 99 mg/dL 098 (H) 119 (H) 147 (H) 199 (H) 160 (H) 224 (H)   Diabetes history: DM 2 Outpatient Diabetes medications: Lantus 20 units qhs, Novolog 10 units bid, metformin 1000 mg bid, Trulicity 0.75 mg weekly Current orders for Inpatient glycemic control:  Levemir 20 units qhs Novolog 10 units bid Novolog 0-15 units tid + hs  A1c 12.6% on 2/20  Spoke with pt at bedside regarding A1c of 12.6% and glucose control at home. When pt checks her glucose it is usually high in the 200-300 range. Pt recently had a PCP appointment at the Internal Medicine clinic and was restarted on metformin and started on a trulicity injection once a week. Pt was consistently taking the metformin however, pt did not know how to use the Trulicity injectable and had not begun taking it. I discussed how to use the trulicity injectable pen. Encouraged glucose control to prevent complications. Told pt to follow up with her PCP with glucose tends and to contact the PCP office if glucose trends are not at goal.   Thanks,  Christena Deem RN, MSN, BC-ADM Inpatient Diabetes Coordinator Team Pager [phone] (8a-5p)

## 2022-08-13 NOTE — TOC Benefit Eligibility Note (Signed)
Patient Product/process development scientist completed.    The patient is currently admitted and upon discharge could be taking Xarelto.  The current 30 day co-pay is $4.00.   The patient is currently admitted and upon discharge could be taking Eliquis.  The current 30 day co-pay is $4.00.   The patient is currently admitted and upon discharge could be taking Pradaxa.  The current 30 day co-pay is $4.00.    The patient is insured through [facility]

## 2022-08-13 NOTE — Progress Notes (Signed)
NAME:  JAKYIA GACCIONE, MRN:  [id number], DOB:  [birth year], LOS: 1 ADMISSION DATE:  08/11/2022, CONSULTATION DATE:  08/12/2022 REFERRING MD:  Dr. Clayborne Dana, CHIEF COMPLAINT:  PE   History of Present Illness:  42 year old woman with pertinent PMH PE 2022 no longer on eliquis, asthma, anxiety/depression, DMT2 presents to [facility] ED on 4/21 with PE.  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 Was discharged on Eliquis and was supposed to take for 6 months. Patient states she did not complete the full 6 months before she stopped taking. States she has not been on OCPs. Doesn't smoke cigarettes but occasionally marijuana. States she is not very active. No hx of cancer or recent surgical hx. No family coagulation disorders other than aunt who had PE.  Patient states she has asthma and has felt that she has had multiple asthma attacks over the past week.  States she is having a productive cough and increased dyspnea with exertion.  Has been using albuterol nebulizers without much relief also states that her right foot has "turned purple for 30 minutes" that began on 4/20.  Patient came to [facility] ED on 4/21 for further eval.  Upon arrival to [facility] ED, patient sats 100% on room air.  BP 107/82.  Afebrile and sinus tachycardia 120s.  CXR unremarkable.  Troponin 31, D-dimer 221, BNP 127, LA 1.8.  CTA chest bilateral PE with right heart strain; RV/LV 1.31; mild patchy RUL and LLL infiltrates.  Started on heparin. Patient hemodynamically stable and to be admitted to [facility] service.  PCCM consulted for PE.  Pertinent Medical History:   Past Medical History:  Diagnosis Date   Anxiety    Arthritis    Asthma    Bronchitis    BV (bacterial vaginosis)    Candidiasis    Depression    Diabetes mellitus without complication (HCC)    Endometriosis    Headache(784.0)    migraines   Hypertension    no longer takes meds   Migraines    Muscle spasm    Ovarian cyst    Pelvic pain in female 09/29/2015    Scoliosis    Uterine fibroid    Significant Hospital Events: Including procedures, antibiotic start and stop dates in addition to other pertinent events   4/21 admitted w/ PE 4/22 Remains SOB, fatigued. Desats to mid-80s with exertion. Tachycardic to 120s, 130s with exertion. Remains normotensive. CP with coughing, some pressure at rest.  Interim History / Subjective:  Just finished ambulating to bathroom/doing peri-care with PT Appears very fatigued, up in chair at bedside Per PT, desat to 86% with ambulation, tachy to 130s Remained normotensive, not orthostatic C/o SOB, mild CP with cough, pressure at rest Feels exhausted  Objective   Blood pressure 107/82, pulse (!) 113, temperature 98 F (36.7 C), temperature source Oral, resp. rate 20, SpO2 96 %.        Intake/Output Summary (Last 24 hours) at 08/13/2022 0950 Last data filed at 08/13/2022 0453 Gross per 24 hour  Intake 1279.5 ml  Output --  Net 1279.5 ml   There were no vitals filed for this visit.  Physical Examination: General: Acutely ill-appearing middle-aged woman in NAD. Appears fatigued. HEENT: [place]/AT, anicteric sclera, PERRL, moist mucous membranes. Neuro: Awake, oriented x 4. Responds to verbal stimuli. Following commands consistently. Moves all 4 extremities spontaneously. CV: Tachycardic to 110s, regular rhythm, no m/g/r. PULM: Breathing even and unlabored on 4LNC. Lung fields diminished bilaterally. GI: Soft, nontender,  nondistended. Extremities: Bilateral symmetric trace LE edema noted. Skin: Warm/dry, no rashes.  Resolved Hospital Problem List:    Assessment & Plan:  Bilateral PE with RV strain CTA chest bilateral PE with right heart strain; RV/LV 1.31; mild patchy RUL and LLL infiltrates. Risk factors are recent PE. Echo 4/21 with EF 65-60%, no RWMAs, mild LVH; interventricular septum flattened in systole/diastole c/w RV pressure/overload; +McConnell's sign with moderate RV enlargement. LE Dopplers  negative for DVT. - Supplemental O2 for SpO2 > 90%, wean as able - Heparin gtt for now - Given lack of clinical improvement, we have requested that IR evaluate candidacy for catheter-directed lytics - Can transition to DOAC when clinically improving - Cardiac monitoring/telemetry  Asthma - Bronchodilators (albuterol) PRN - Pulmonary hygiene  Best Practice: (right click and "Reselect all SmartList Selections" daily)   Per Primary  Signature:   Tim Lair, PA-C [facility] 08/13/22 9:51 AM  Please see [email or web address] for pager details.  From 7A-7P if no response, please call [phone] After hours, please call ELink [phone]

## 2022-08-14 ENCOUNTER — Other Ambulatory Visit: Payer: Self-pay

## 2022-08-14 ENCOUNTER — Inpatient Hospital Stay (HOSPITAL_COMMUNITY): Payer: Medicaid Other

## 2022-08-14 ENCOUNTER — Telehealth (HOSPITAL_BASED_OUTPATIENT_CLINIC_OR_DEPARTMENT_OTHER): Payer: Self-pay | Admitting: Pulmonary Disease

## 2022-08-14 DIAGNOSIS — I2609 Other pulmonary embolism with acute cor pulmonale: Secondary | ICD-10-CM | POA: Diagnosis not present

## 2022-08-14 DIAGNOSIS — I2694 Multiple subsegmental pulmonary emboli without acute cor pulmonale: Secondary | ICD-10-CM | POA: Diagnosis not present

## 2022-08-14 HISTORY — PX: IR THROMB F/U EVAL ART/VEN FINAL DAY (MS): IMG5379

## 2022-08-14 LAB — CBC
HCT: 30.4 % — ABNORMAL LOW (ref 36.0–46.0)
HCT: 28.5 % — ABNORMAL LOW (ref 36.0–46.0)
HCT: 29.5 % — ABNORMAL LOW (ref 36.0–46.0)
Hemoglobin: 8.8 g/dL — ABNORMAL LOW (ref 12.0–15.0)
Hemoglobin: 8.2 g/dL — ABNORMAL LOW (ref 12.0–15.0)
Hemoglobin: 8.6 g/dL — ABNORMAL LOW (ref 12.0–15.0)
MCH: 22.2 pg — ABNORMAL LOW (ref 26.0–34.0)
MCH: 22.3 pg — ABNORMAL LOW (ref 26.0–34.0)
MCH: 22 pg — ABNORMAL LOW (ref 26.0–34.0)
MCHC: 28.9 g/dL — ABNORMAL LOW (ref 30.0–36.0)
MCHC: 28.8 g/dL — ABNORMAL LOW (ref 30.0–36.0)
MCHC: 29.2 g/dL — ABNORMAL LOW (ref 30.0–36.0)
MCV: 76.8 fL — ABNORMAL LOW (ref 80.0–100.0)
MCV: 77.4 fL — ABNORMAL LOW (ref 80.0–100.0)
MCV: 75.4 fL — ABNORMAL LOW (ref 80.0–100.0)
Platelets: 300 10*3/uL (ref 150–400)
Platelets: 207 10*3/uL (ref 150–400)
Platelets: 292 10*3/uL (ref 150–400)
RBC: 3.96 MIL/uL (ref 3.87–5.11)
RBC: 3.68 MIL/uL — ABNORMAL LOW (ref 3.87–5.11)
RBC: 3.91 MIL/uL (ref 3.87–5.11)
RDW: 17.2 % — ABNORMAL HIGH (ref 11.5–15.5)
RDW: 17.7 % — ABNORMAL HIGH (ref 11.5–15.5)
RDW: 17.5 % — ABNORMAL HIGH (ref 11.5–15.5)
WBC: 10.2 10*3/uL (ref 4.0–10.5)
WBC: 9.7 10*3/uL (ref 4.0–10.5)
WBC: 9.3 10*3/uL (ref 4.0–10.5)
nRBC: 0 % (ref 0.0–0.2)
nRBC: 0 % (ref 0.0–0.2)
nRBC: 0.3 % — ABNORMAL HIGH (ref 0.0–0.2)

## 2022-08-14 LAB — HEPARIN LEVEL (UNFRACTIONATED)
Heparin Unfractionated: 0.63 IU/mL (ref 0.30–0.70)
Heparin Unfractionated: 0.67 IU/mL (ref 0.30–0.70)

## 2022-08-14 LAB — FIBRINOGEN
Fibrinogen: 478 mg/dL — ABNORMAL HIGH (ref 210–475)
Fibrinogen: 404 mg/dL (ref 210–475)
Fibrinogen: 503 mg/dL — ABNORMAL HIGH (ref 210–475)

## 2022-08-14 LAB — BASIC METABOLIC PANEL
Anion gap: 12 (ref 5–15)
BUN: 5 mg/dL — ABNORMAL LOW (ref 6–20)
CO2: 21 mmol/L — ABNORMAL LOW (ref 22–32)
Calcium: 8.7 mg/dL — ABNORMAL LOW (ref 8.9–10.3)
Chloride: 103 mmol/L (ref 98–111)
Creatinine, Ser: 0.8 mg/dL (ref 0.44–1.00)
GFR, Estimated: >60 mL/min (ref >60)
Glucose, Bld: 140 mg/dL — ABNORMAL HIGH (ref 70–99)
Potassium: 3.4 mmol/L — ABNORMAL LOW (ref 3.5–5.1)
Sodium: 136 mmol/L (ref 135–145)

## 2022-08-14 LAB — GLUCOSE, CAPILLARY
Glucose-Capillary: 178 mg/dL — ABNORMAL HIGH (ref 70–99)
Glucose-Capillary: 106 mg/dL — ABNORMAL HIGH (ref 70–99)
Glucose-Capillary: 154 mg/dL — ABNORMAL HIGH (ref 70–99)
Glucose-Capillary: 186 mg/dL — ABNORMAL HIGH (ref 70–99)
Glucose-Capillary: 140 mg/dL — ABNORMAL HIGH (ref 70–99)

## 2022-08-14 MED ORDER — POTASSIUM CHLORIDE CRYS ER 20 MEQ PO TBCR
40.0000 meq | EXTENDED_RELEASE_TABLET | Freq: Once | ORAL | Status: AC
  Administered 2022-08-14: 40 meq via ORAL
  Filled 2022-08-14: qty 2

## 2022-08-14 MED ORDER — AZITHROMYCIN 500 MG PO TABS
500.0000 mg | ORAL_TABLET | Freq: Every day | ORAL | Status: DC
  Filled 2022-08-14: qty 1

## 2022-08-14 MED ORDER — APIXABAN 5 MG PO TABS: 5.0000 mg | ORAL_TABLET | Freq: Two times a day (BID) | ORAL | Status: DC

## 2022-08-14 MED ORDER — APIXABAN 5 MG PO TABS
10.0000 mg | ORAL_TABLET | Freq: Two times a day (BID) | ORAL | Status: DC
  Administered 2022-08-14 – 2022-08-16 (×5): 10 mg via ORAL
  Filled 2022-08-14 (×5): qty 2

## 2022-08-14 MED ORDER — OXYCODONE HCL 5 MG PO TABS
5.0000 mg | ORAL_TABLET | Freq: Four times a day (QID) | ORAL | Status: DC | PRN
  Administered 2022-08-15: 10 mg via ORAL
  Administered 2022-08-15: 5 mg via ORAL
  Filled 2022-08-14: qty 2
  Filled 2022-08-14: qty 1

## 2022-08-14 MED ORDER — PANTOPRAZOLE SODIUM 40 MG PO TBEC
40.0000 mg | DELAYED_RELEASE_TABLET | Freq: Every day | ORAL | Status: DC
  Administered 2022-08-14 – 2022-08-16 (×3): 40 mg via ORAL
  Filled 2022-08-14 (×3): qty 1

## 2022-08-14 MED ORDER — GABAPENTIN 300 MG PO CAPS
300.0000 mg | ORAL_CAPSULE | Freq: Three times a day (TID) | ORAL | Status: DC
  Administered 2022-08-14 – 2022-08-16 (×7): 300 mg via ORAL
  Filled 2022-08-14 (×7): qty 1

## 2022-08-14 MED ORDER — MIRTAZAPINE 15 MG PO TABS
45.0000 mg | ORAL_TABLET | Freq: Every day | ORAL | Status: DC
  Administered 2022-08-14 – 2022-08-15 (×2): 45 mg via ORAL
  Filled 2022-08-14 (×2): qty 3

## 2022-08-14 MED ORDER — FLUTICASONE FUROATE-VILANTEROL 200-25 MCG/ACT IN AEPB
1.0000 | INHALATION_SPRAY | Freq: Every day | RESPIRATORY_TRACT | Status: DC
  Administered 2022-08-14 – 2022-08-16 (×3): 1 via RESPIRATORY_TRACT
  Filled 2022-08-14: qty 28

## 2022-08-14 NOTE — Progress Notes (Signed)
Patients name and DOB were verified. Left catheter was measured at 54/10 (35) then pulled back 3-4 cmm and measured at 60/31 (43). The right catheter was measured at 53/31(40) and the right was pulled back 3-4 cm 64/35(45). Both catheters and sheaths were removed. Quikclot was applied and pressure was held until hemostasis. RN check and verified puncture site.      Jaunita Mikels RT(R)

## 2022-08-14 NOTE — Progress Notes (Signed)
[facility] ADULT ICU REPLACEMENT PROTOCOL   The patient does apply for the [facility] Adult ICU Electrolyte Replacment Protocol based on the criteria listed below:   1.Exclusion criteria: TCTS, ECMO, Dialysis, and Myasthenia Gravis patients 2. Is GFR >/= 30 ml/min? Yes.    Patient's GFR today is >60 3. Is SCr </= 2? Yes.   Patient's SCr is 0.8 mg/dL 4. Did SCr increase >/= 0.5 in 24 hours? No. 5.Pt's weight >40kg  Yes.   6. Abnormal electrolyte(s): k 3.4  7. Electrolytes replaced per protocol 8.  Call MD STAT for K+ </= 2.5, Phos </= 1, or Mag </= 1 Physician:    Markus Daft A 08/14/2022 3:07 AM

## 2022-08-14 NOTE — Progress Notes (Incomplete)
HD#3 Subjective:   42 year old woman with pertinent PMH of submassive PE 2022 finished 6 months of eliquis, asthma, bipolar 2 disorder, DMT2, IDA presents to [facility] ED on 4/21 with chest pain and found to have bilateral PE with right heart train. She underwent catheter direct thrombolysis with IR on 4/22.   Overnight Events: Patient started on Eliquis therapy post catheter-directed thrombolysis  Patient continues to experience dyspnea at rest and with ambulation. BM without evidence or blood or melena. Tolerating PO intake. Improvement in discomfort from neck from catheter insertion site, though still painful. Patient reports resolution of chest pain, but increased dry cough.  Objective:  Vital signs in last 24 hours: Vitals:   08/15/22 0800 08/15/22 0900 08/15/22 1000 08/15/22 1014  BP: 109/78 97/65 (!) 87/62 102/80  Pulse: (!) 104 100 94 99  Resp: 17 (!) 23 (!) 23 16  Temp:      TempSrc:      SpO2: 91% 94% (!) 89% 93%  Weight:        Supplemental O2:  92% on 4L   Physical Exam:  Constitutional - Alert, in mild discomfort Neck - R lower neck with bandage over catheter insertion site without tenderness, fluctuance, or drainage Cardiovascular: Tachycardia. 2+ radial and DP pulses. Respiratory: tachypneic on 4L [place], clear to auscultation bilaterally with mild expiratory wheezing Abdomen: soft, non tender, non distended MSK: moving all extremities, no LE edema Neuro: A&Ox4 Mood: pleasant mood and affect   Filed Weights   08/14/22 0900  Weight: 93 kg     Intake/Output Summary (Last 24 hours) at 08/15/2022 1107 Last data filed at 08/15/2022 0800 Gross per 24 hour  Intake 1320 ml  Output 450 ml  Net 870 ml   Net IO Since Admission: 3,372.17 mL [08/15/22 1107]  Pertinent Labs:     Latest Ref Rng & Units 08/15/2022    7:46 AM 08/14/2022   12:53 PM 08/14/2022    7:18 AM  CBC  WBC 4.0 - 10.5 K/uL 9.6  9.3  9.7   Hemoglobin 12.0 - 15.0 g/dL 8.4  8.6  8.2   Hematocrit  36.0 - 46.0 % 29.4  29.5  28.5   Platelets 150 - 400 K/uL 344  292  207        Latest Ref Rng & Units 08/15/2022    7:46 AM 08/14/2022    1:30 AM 08/13/2022    2:57 AM  CMP  Glucose 70 - 99 mg/dL 161  096  045   BUN 6 - 20 mg/dL 5  5  6    Creatinine 0.44 - 1.00 mg/dL 4.09  8.11  9.14   Sodium 135 - 145 mmol/L 137  136  136   Potassium 3.5 - 5.1 mmol/L 3.4  3.4  3.3   Chloride 98 - 111 mmol/L 104  103  105   CO2 22 - 32 mmol/L Calcium 8.9 - 10.3 mg/dL 8.7  8.7  8.7     Imaging: No results found.  Assessment/Plan:   Principal Problem:   Pulmonary embolism Active Problems:   Pulmonary embolus   Patient Summary: Dana Hughes is a 42 year old woman with pertinent PMH of submassive PE 2022 finished 6 months of eliquis, asthma, bipolar 2 disorder, DMT2, IDA presents to [facility] ED on 4/21 with chest pain and found to have bilateral PE with right heart train, s/p catheter directed thrombolytic 4/22   Acute sub-massive bilateral PE with Right heart  strain Hx of pulmonary embolism 2022 Tachypneic today. otherwise remains hemodynamically stable. Patient with sustained dyspnea but improvement in chest pain; will continue to monitor and engage in PT therapy. Suspect that she will continue to recover within the next 2 days, which will elucidate discharge planning. -Continue Eliquis 10 mg BID for 6 more days then 5 mg BID indifinetly -Consider hypercoagulable work up outpatient for recurrent PEs -Need up-to-date cancer prevention screening -F/u with Pulmonary outpatient for repeat Echo   Acute hypoxic respiratory failure Asthma Her acute hypoxic respiratory failure is likely 2/2 PE. Previously on cerftriaxine/ Azithromycin community acquired pneumonia for 2 days, but this was discontinued as there was low clinical suspicion for infective process. Patient was started on ICS-LABA inhaler and is to follow up at pulmonology office once discharged. Increased wheezing today; will add  ICS-LABA as rescue inhaler today -Continue Breo Ellipta daily and PRN for rescue inhaler up to 8x/day -PT/OT  Insulin dependent type 2 diabetes mellitus Last A1c of 12.6%. Home regimen of metformin, lantus 20 U QHS and novolog 10 U BID. Recently prescribed Trulicity but was not using. Did not use QHS of insulin yesterday for low BG.  -Continue of Aspart 10u BID with meals -SSI -CBG with meals and bedtime -Add QHS lantus once resumes full diet   Chronic iron deficiency anemia Hgb around 9 at baseline. Confirmed iron deficiency with ferritin of 8 and iron sat of 3. Etiology of her IDA is either GI bleed vs vaginal bleed. She does have Hx of endometriosis and uterine fibroids. S/p FerraHeme 510 mg. Hgb stable s/p procedure today -Ferraheme 510 mg today -F/u with GI for repeat colonoscopy -F/u with OB/Gyn for irregular menstrual cycle   Hypertension -hold anti-hypertensives in setting of pulmonary embolism   Polysubstance use -continue to encourage cessation of cocaine and marijuana  Schizophrenia Depression/Anxiety -continue Remeron, Abilify, Lamictal  Diet: Heart Healthy IVF: None,None VTE: NOAC Code: Full PT/OT recs: Pending, none. TOC recs:   Dispo: Anticipated discharge to Home in 2 days pending clinical improvement and decreased O2 requirement.   Morene Crocker, MD [facility] Internal Medicine Program - PGY-1 08/15/2022, 11:07 AM    Please contact the on call pager after 5 pm and on weekends at [phone].     Internal Medicine Attending:   I saw and examined the patient. I reviewed the resident's note and I agree with the resident's findings and plan as documented in the resident's note.  Patient is comfortable appearing, requiring 4 L nasal cannula at rest to maintain saturations.  Etiology of hypoxia is likely multifactorial.  She has wheezing on exam today, will increase anti-inflammatory relievers today.  Tolerating anticoagulation with apixaban well.   Would like to see her out of bed more, seems deconditioned, depressed affect.  Erlinda Hong, MD

## 2022-08-14 NOTE — Progress Notes (Addendum)
ANTICOAGULATION CONSULT NOTE - Follow Up Consult  Pharmacy Consult for IV heparin Indication: pulmonary embolus  Allergies  Allergen Reactions   Mushroom Extract Complex Anaphylaxis   Latex Itching and Swelling   Penicillins Itching    Has patient had a PCN reaction causing immediate rash, facial/tongue/throat swelling, SOB or lightheadedness with hypotension: No Has patient had a PCN reaction causing severe rash involving mucus membranes or skin necrosis: No Has patient had a PCN reaction that required hospitalization No Has patient had a PCN reaction occurring within the last 10 years: No If all of the above answers are "NO", then may proceed with Cephalosporin use.     Patient Measurements: Weight: 93 kg (205 lb) (pt estimate) Heparin Dosing Weight: 94 kg  Vital Signs: Temp: 98 F (36.7 C) (04/23 0728) Temp Source: Oral (04/23 0728) BP: 116/81 (04/23 0900) Pulse Rate: 97 (04/23 0900)  Labs: Recent Labs    08/11/22 2205 08/12/22 0036 08/12/22 0535 08/12/22 0800 08/13/22 0257 08/13/22 2013 08/14/22 0130 08/14/22 0718 08/14/22 0839  HGB 10.1*  --   --   --  9.4* 9.0* 8.8* 8.2*  --   HCT 33.8*  --   --   --  32.6* 30.7* 30.4* 28.5*  --   PLT 334  --   --   --  349 284 300 207  --   HEPARINUNFRC  --   --   --    < > 0.63 0.49 0.63  --  0.67  CREATININE 0.78  --  0.74  --  0.69  --  0.80  --   --   TROPONINIHS 31* 33*  --   --   --   --   --   --   --    < > = values in this interval not displayed.     Estimated Creatinine Clearance: 100.2 mL/min (by C-G formula based on SCr of 0.8 mg/dL).   Assessment: 42 yo female admitted with pulmonary embolism, pharmacy asked to start anticoagulation with IV heparin. Previously on Eliquis for PE in 2022 - no longer taking. Pt s/p cath directed lysis in IR - alteplase 12 mg infusion in each cath x 12 hours. Catheter now removed. Pt to be moved out of ICU and transitioned to PO anticoag as able by primary.   Heparin level  remains therapeutic (0.67) on infusion at 1400 units/hr. No issues with infusion or bleeding noted.   Goal of Therapy:  Heparin level 0.3-0.7 units/ml Monitor platelets by anticoagulation protocol: Yes   Plan:  Continue heparin 1400 units/h Q6h fibrinogen, CBC, and heparin level per protocol -transition to PO anticoag as appropriate  --  Addendum:  Pharmacy to switch heparin infusion> Eliquis  Stop heparin gtt at time of first eliquis dose Eliquis 10 BID x7 days then Eliquis 5 BID thereafter -f/u s/sx bleeding and CBC   Calton Dach, PharmD Clinical Pharmacist 08/14/2022 9:23 AM

## 2022-08-14 NOTE — Discharge Instructions (Addendum)
[name] -  You were admitted to the hospital for shortness of breath and chest pain and were found to have blood clots in both of your lung (pulmonary embolism). You were treated with a clot dissolving agent directly into the sites of the clots and systemic blood thinners, which have slowly improved your symptoms. Your vitals are stable and you are now able to go home with the following instructions of care:  MEDICATIONS: 1. You will be on blood thinners for the rest of your life: - Blood thinner = Eliquis, also known as apixaban - Start Eliquis 10 mg total (TWO TABLETS from the same bottle) every 12 hrs starting tonight Thursday 4/25 at night until 08/20/2022  - Then continue taking Eliquis 5 mg total (ONE TABLET) every 12 hours starting on Tuesday 4/30   Please let your providers know before you run out of your medication. You should have 2 additional refills ready for you; once you are ready to access this refills, call your home pharmacy and tell them to request the transfer of medications from [facility] transitions of care pharmacy.   2. You will also be going home with oxygen to help with your shortness of breath. Please continue to be active to improve your breathing. Start slow and as you are able.  To help your asthma - you are going home with a new inhaler called Dulera - please use 2 puffs of this medication daily. You can also use this medication every 6hrs when you feel short of breath.   3. Because of your loose stools and low potassium while in the hospital, we are sending you a prescription of potassium.  - Take one 20 meq tablet once a day for 3 days.  - Continue to drink plenty of fluids and eat as your able.  We will check your potassium level when you come to the [facility] for your follow-up visit.  4.  You blood work also showed signs of iron deficiency anemia.  You received iron supplementation through your veins.  But you will also need iron supplementation orally once you are  home.  We are prescribing you with an iron supplement.   - Take 1 tablet every other day -Your stools may look dark; if you are having problems with constipation resume your MiraLAX.  APPOINTMENTS - You have an appointment at the [facility] on Monday 08/20/2022 at 2:45 PM with Dr. Burnice Logan.  We will obtain blood tests and monitor your current symptoms. -Please follow up with pulmonology, gastroenterology, and gynecologist when you are discharged. I am adding the numbers where you can reach out to them.  If you experience worsening shortness of breath, chest pain, more than a teaspoon of blood in your sputum, blood in your urine or in your stools, please call a the clinic and plan to be evaluated.  Take care,  Morene Crocker, MD    ---------------------------------------------------------------------------  Information on my medicine - ELIQUIS (apixaban)  Why was Eliquis prescribed for you? Eliquis was prescribed to treat blood clots that may have been found in the veins of your legs (deep vein thrombosis) or in your lungs (pulmonary embolism) and to reduce the risk of them occurring again.  What do You need to know about Eliquis ? The starting dose is 10 mg (two 5 mg tablets) taken TWICE daily for the FIRST SEVEN (7) DAYS, then on (Tuesday April 30th) 08/21/2022  the dose is reduced to ONE 5 mg tablet taken TWICE  daily.  Eliquis may be taken with or without food.   Try to take the dose about the same time in the morning and in the evening. If you have difficulty swallowing the tablet whole please discuss with your pharmacist how to take the medication safely.  Take Eliquis exactly as prescribed and DO NOT stop taking Eliquis without talking to the doctor who prescribed the medication.  Stopping may increase your risk of developing a new blood clot.  Refill your prescription before you run out.  After discharge, you should have regular check-up appointments with  your healthcare provider that is prescribing your Eliquis.    What do you do if you miss a dose? If a dose of ELIQUIS is not taken at the scheduled time, take it as soon as possible on the same day and twice-daily administration should be resumed. The dose should not be doubled to make up for a missed dose.  Important Safety Information A possible side effect of Eliquis is bleeding. You should call your healthcare provider right away if you experience any of the following: Bleeding from an injury or your nose that does not stop. Unusual colored urine (red or dark brown) or unusual colored stools (red or black). Unusual bruising for unknown reasons. A serious fall or if you hit your head (even if there is no bleeding).  Some medicines may interact with Eliquis and might increase your risk of bleeding or clotting while on Eliquis. To help avoid this, consult your healthcare provider or pharmacist prior to using any new prescription or non-prescription medications, including herbals, vitamins, non-steroidal anti-inflammatory drugs (NSAIDs) and supplements.  This website has more information on Eliquis (apixaban): http://www.eliquis.com/eliquis/home

## 2022-08-14 NOTE — Progress Notes (Addendum)
ANTICOAGULATION CONSULT NOTE - Follow Up Consult  Pharmacy Consult for IV heparin Indication: pulmonary embolus  Allergies  Allergen Reactions   Mushroom Extract Complex Anaphylaxis   Latex Itching and Swelling   Penicillins Itching    Has patient had a PCN reaction causing immediate rash, facial/tongue/throat swelling, SOB or lightheadedness with hypotension: No Has patient had a PCN reaction causing severe rash involving mucus membranes or skin necrosis: No Has patient had a PCN reaction that required hospitalization No Has patient had a PCN reaction occurring within the last 10 years: No If all of the above answers are "NO", then may proceed with Cephalosporin use.     Patient Measurements:   Heparin Dosing Weight: 94 kg  Vital Signs: Temp: 98 F (36.7 C) (04/23 0340) Temp Source: Oral (04/23 0340) BP: 122/88 (04/23 0400) Pulse Rate: 106 (04/23 0400)  Labs: Recent Labs    08/11/22 2205 08/12/22 0036 08/12/22 0535 08/12/22 0800 08/13/22 0257 08/13/22 2013 08/14/22 0130  HGB 10.1*  --   --   --  9.4* 9.0* 8.8*  HCT 33.8*  --   --   --  32.6* 30.7* 30.4*  PLT 334  --   --   --  349 284 300  HEPARINUNFRC  --   --   --    < > 0.63 0.49 0.63  CREATININE 0.78  --  0.74  --  0.69  --  0.80  TROPONINIHS 31* 33*  --   --   --   --   --    < > = values in this interval not displayed.     CrCl cannot be calculated (Unknown ideal weight.).   Assessment: 42 yo female admitted with pulmonary embolism, pharmacy asked to start anticoagulation with IV heparin. Previously on Eliquis for PE in 2022 - no longer taking.   Pt s/p cath directed lysis in IR - alteplase 12 mg infusion in each cath x 12 hours  Heparin level remains therapeutic (0.63) on infusion at 1400 units/hr. Hgb stable 8.8, fibrinogen 478. No issues with infusion or bleeding noted.  Goal of Therapy:  Heparin level 0.3-0.7 units/ml Monitor platelets by anticoagulation protocol: Yes   Plan:  Continue  heparin 1400 units/h Q6h fibrinogen, CBC, and heparin level per protocol    Thank you for allowing [place] to participate in this patients care. Signe Colt, PharmD 08/14/2022 5:10 AM  **Pharmacist phone directory can be found on [email or web address] listed under [facility]**

## 2022-08-14 NOTE — Progress Notes (Addendum)
NAME:  Dana Hughes, MRN:  [id number], DOB:  24-Dec-1980, LOS: 2 ADMISSION DATE:  08/11/2022, CONSULTATION DATE:  08/12/2022 REFERRING MD:  Dr. Clayborne Dana, CHIEF COMPLAINT:  PE   History of Present Illness:  42 year old woman with pertinent PMH PE 2022 no longer on eliquis, asthma, anxiety/depression, DMT2 presents to [facility] ED on 4/21 with PE.  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 Was discharged on Eliquis and was supposed to take for 6 months. Patient states she did not complete the full 6 months before she stopped taking. States she has not been on OCPs. Doesn't smoke cigarettes but occasionally marijuana. States she is not very active. No hx of cancer or recent surgical hx. No family coagulation disorders other than aunt who had PE.  Patient states she has asthma and has felt that she has had multiple asthma attacks over the past week.  States she is having a productive cough and increased dyspnea with exertion.  Has been using albuterol nebulizers without much relief also states that her right foot has "turned purple for 30 minutes" that began on 4/20.  Patient came to [facility] ED on 4/21 for further eval.  Upon arrival to [facility] ED, patient sats 100% on room air.  BP 107/82.  Afebrile and sinus tachycardia 120s.  CXR unremarkable.  Troponin 31, D-dimer 221, BNP 127, LA 1.8.  CTA chest bilateral PE with right heart strain; RV/LV 1.31; mild patchy RUL and LLL infiltrates.  Started on heparin. Patient hemodynamically stable and to be admitted to [facility] service.  PCCM consulted for PE.  Pertinent Medical History:   Past Medical History:  Diagnosis Date   Anxiety    Arthritis    Asthma    Bronchitis    BV (bacterial vaginosis)    Candidiasis    Depression    Diabetes mellitus without complication    Endometriosis    Headache(784.0)    migraines   Hypertension    no longer takes meds   Migraines    Muscle spasm    Ovarian cyst    Pelvic pain in female 09/29/2015    Scoliosis    Uterine fibroid    Significant Hospital Events: Including procedures, antibiotic start and stop dates in addition to other pertinent events   4/21 admitted w/ PE 4/22 Remains SOB, fatigued. Desats to mid-80s with exertion. Tachycardic to 120s, 130s with exertion. Remains normotensive. CP with coughing, some pressure at rest.  Interim History / Subjective:  S/p catheter directed thrombolysis yesterday R IJ catheter removed this morning On 2L  Objective   Blood pressure 116/81, pulse 97, temperature 98 F (36.7 C), temperature source Oral, resp. rate 17, SpO2 93 %.        Intake/Output Summary (Last 24 hours) at 08/14/2022 0906 Last data filed at 08/14/2022 0900 Gross per 24 hour  Intake 1464.73 ml  Output 250 ml  Net 1214.73 ml   There were no vitals filed for this visit.  Physical Exam: General: Well-appearing, no acute distress HENT: [place], AT, OP clear, MMM Eyes: EOMI, no scleral icterus Respiratory: Clear to auscultation bilaterally.  No crackles, wheezing or rales Cardiovascular: RRR, -M/R/G, no JVD GI: BS+, soft, nontender Extremities:-Edema,-tenderness Neuro: AAO x4, CNII-XII grossly intact  K 3.4  Resolved Hospital Problem List:    Assessment & Plan:  Acute hypoxemic respiratory failure 2/2 PE Bilateral PE with RV strain Hx PE 2022 CTA chest bilateral PE with right heart strain; RV/LV 1.31; mild patchy RUL  and LLL infiltrates. Risk factors are recent PE. Echo 4/21 with EF 65-60%, no RWMAs, mild LVH; interventricular septum flattened in systole/diastole c/w RV pressure/overload; +McConnell's sign with moderate RV enlargement. LE Dopplers negative for DVT. - S/p catheter directed lytics 4/22 - Post-procedure CBC and fibrinogen q4h - Supplemental O2 for SpO2 > 90%, wean as able - Continue heparin for now. Will need to transition to PO DOAC for lifelong anticoagulation - Tele - DC antibiotics as low suspicion for infectious etiology of her respiratory  failure - Will need Pulmonary follow-up with Dr. Judeth Horn in 1-2 months with plan for repeat echo  Asthma - Previously on PRN albuterol at home. Start Breo daily - Bronchodilators (albuterol) PRN - Pulmonary hygiene  DM2 - Mealtime coverage BID - SSI  Schizophrenia Depression/Anxiety - continue abilify and lamictal  Hypertension -hold anti-hypertensives in setting of pulmonary embolism  Hypokalemia Repleted  Best Practice: (right click and "Reselect all SmartList Selections" daily)   Transfer to IMTS  Signature:    Care Time: 50 min  Mechele Collin, M.D. [facility] Pulmonary/Critical Care Medicine 08/14/2022 9:06 AM   Please see Amion for pager number to reach on-call Pulmonary and Critical Care Team.

## 2022-08-14 NOTE — Telephone Encounter (Signed)
Please schedule patient for hospital follow-up with Dr. Judeth Horn in 1-2 months. Patient currently hospitalized and need appointment prior to discharge

## 2022-08-14 NOTE — Telephone Encounter (Signed)
Hospital f/u scheduled with [place] 05/30 11:15a  Nothing further

## 2022-08-14 NOTE — Progress Notes (Addendum)
Referring Physician(s): Cloyd Stagers PA  Supervising Physician: Gilmer Mor  Patient Status:  [facility] - In-pt  Chief Complaint:  Submassive PE/ PE Lysis on 4.23.24  Subjective:  Patient reporting improvement in breathing. She endorses exacerbation with exertion.  Allergies: Mushroom extract complex, Latex, and Penicillins  Medications: Prior to Admission medications   Medication Sig Start Date End Date Taking? Authorizing Provider  acetaminophen (TYLENOL) 500 MG tablet Take 2 tablets (1,000 mg total) by mouth every 6 (six) hours as needed for mild pain (or Fever >/= 101). 08/12/20  Yes Christian, [name], MD  albuterol (PROVENTIL) (2.5 MG/3ML) 0.083% nebulizer solution Take 3 mLs (2.5 mg total) by nebulization every 6 (six) hours as needed for wheezing or shortness of breath. 07/17/21  Yes Evlyn Kanner, MD  albuterol (VENTOLIN HFA) 108 (90 Base) MCG/ACT inhaler Inhale 2 puffs into the lungs every 6 (six) hours as needed for wheezing or shortness of breath. Reported on 09/29/2015 07/17/21  Yes Evlyn Kanner, MD  amLODipine (NORVASC) 5 MG tablet Take 1 tablet (5 mg total) by mouth daily. 04/03/22 04/03/23 Yes Morene Crocker, MD  ARIPiprazole (ABILIFY) 20 MG tablet Take 1 tablet (20 mg total) by mouth daily. 06/27/22  Yes Toy Cookey E, NP  artificial tears (LACRILUBE) OINT ophthalmic ointment Place into both eyes at bedtime as needed for dry eyes. 05/02/18  Yes [place] Scrape, Alyssa B, PA-C  bisacodyl (DULCOLAX) 5 MG EC tablet Take 1 tablet (5 mg total) by mouth daily as needed for moderate constipation. 05/16/22  Yes Tressia Danas, MD  diclofenac Sodium (VOLTAREN) 1 % GEL Apply 4 g topically 4 (four) times daily. 07/17/21  Yes Evlyn Kanner, MD  dicyclomine (BENTYL) 10 MG capsule Take 1 capsule (10 mg total) by mouth 4 (four) times daily as needed for spasms (abdominal pain). 05/16/22  Yes Tressia Danas, MD  Dulaglutide (TRULICITY) 0.75 MG/0.5ML SOPN Inject 0.75 mg  into the skin once a week. 06/12/22  Yes Evlyn Kanner, MD  fluticasone (FLONASE) 50 MCG/ACT nasal spray PLACE 1 SPRAY INTO BOTH NOSTRILS IN THE MORNING AND AT BEDTIME. Patient taking differently: 2 sprays. 06/20/20 08/12/22 Yes Verdene Lennert, MD  gabapentin (NEURONTIN) 300 MG capsule Take 1 capsule (300 mg total) by mouth 3 (three) times daily. 06/27/22  Yes Toy Cookey E, NP  insulin aspart (NOVOLOG) 100 UNIT/ML FlexPen Inject 10 Units into the skin 2 (two) times daily with a meal. 06/12/22  Yes Evlyn Kanner, MD  insulin glargine (LANTUS) 100 UNIT/ML Solostar Pen Inject 20 Units into the skin daily at 10pm. 06/12/22  Yes Evlyn Kanner, MD  lamoTRIgine (LAMICTAL) 150 MG tablet Take 1 tablet (150 mg total) by mouth daily. 06/27/22  Yes Toy Cookey E, NP  meclizine (ANTIVERT) 25 MG tablet Take 1 tablet (25 mg total) by mouth 3 (three) times daily as needed for dizziness. 05/06/18  Yes Domenick Gong, MD  metFORMIN (GLUCOPHAGE) 1000 MG tablet Take 1 tablet (1,000 mg total) by mouth 2 (two) times daily with a meal. 06/12/22  Yes Evlyn Kanner, MD  metroNIDAZOLE (METROGEL) 0.75 % vaginal gel Place 1 Applicatorful vaginally at bedtime for 5 days. 03/02/22  Yes Lorriane Shire, MD  mirtazapine (REMERON) 45 MG tablet Take 1 tablet (45 mg total) by mouth at bedtime. 06/27/22  Yes Toy Cookey E, NP  omeprazole (PRILOSEC) 40 MG capsule Take 1 capsule (40 mg total) by mouth daily. 02/09/22  Yes Ellsworth Lennox, PA-C  polyethylene glycol powder (GLYCOLAX/MIRALAX) 17 GM/SCOOP powder Take 127.5 g by mouth daily. 05/16/22  Yes Tressia Danas, MD  senna-docusate (SENOKOT S) 8.6-50 MG tablet Take 1 tablet by mouth daily. 12/14/20  Yes Evlyn Kanner, MD  SUMAtriptan (IMITREX) 50 MG tablet Take 1 tablet (50 mg total) by mouth every 2 (two) hours as needed for migraine. May repeat in 2 hours if headache persists or recurs. Please take no more than 3 tablets (150 mg) in 24 hours. 12/10/20   Yes Dellia Cloud, MD  Tasimelteon (HETLIOZ) 20 MG CAPS Take 20 mg by mouth at bedtime. 06/27/22  Yes Toy Cookey E, NP  AMBULATORY NON FORMULARY MEDICATION Nitroglycerin 0.125% gel - apply a pea size amount to your rectum three times daily x 6-8 weeks. Patient not taking: Reported on 08/12/2022 05/16/22   Tressia Danas, MD  Blood Glucose Monitoring Suppl (TRUE METRIX METER) w/Device KIT use as directed 02/09/22   Ellsworth Lennox, PA-C  glucose blood test strip Check your sugar in the morning before you eat breakfast, and one hour after a meal. 02/09/22   Ellsworth Lennox, PA-C  ibuprofen (ADVIL) 600 MG tablet Take 1 tablet (600 mg total) by mouth 3 (three) times daily. Patient not taking: Reported on 08/12/2022 02/09/22   Ellsworth Lennox, PA-C  Insulin Pen Needle 32G X 4 MM MISC Use as directed 4 times daily. 02/09/22   Ellsworth Lennox, PA-C  lidocaine (LIDODERM) 5 % Place 1 patch onto the skin daily. Remove & Discard patch within 12 hours or as directed by MD Patient not taking: Reported on 08/12/2022 06/12/22   Evlyn Kanner, MD  metoprolol tartrate (LOPRESSOR) 100 MG tablet TAKE 1 TABLET BY MOUTH 2 HOURS BEFORE MRI. Patient not taking: Reported on 08/12/2022 11/07/21 02/28/22  Maisie Fus, MD  TRUEplus Lancets 28G MISC USE AS DIRECTED 02/09/22   Ellsworth Lennox, PA-C     Vital Signs: BP 106/74   Pulse 96   Temp 98.1 F (36.7 C) (Oral)   Resp (!) 21   Wt 205 lb (93 kg) Comment: pt estimate  SpO2 90%   BMI 36.31 kg/m   Physical Exam Vitals and nursing note reviewed.  Constitutional:      Appearance: She is well-developed. She is obese.  HENT:     Head: Normocephalic and atraumatic.  Eyes:     Conjunctiva/sclera: Conjunctivae normal.  Cardiovascular:     Rate and Rhythm: Normal rate and regular rhythm.  Pulmonary:     Effort: Pulmonary effort is normal.     Comments: On o2 via [place] Musculoskeletal:        General: Normal range of motion.     Cervical back: Normal range of motion.   Skin:    General: Skin is warm and dry.  Neurological:     General: No focal deficit present.     Mental Status: She is alert and oriented to person, place, and time.  Psychiatric:        Mood and Affect: Mood normal.        Behavior: Behavior normal.     Imaging: ECHOCARDIOGRAM COMPLETE  Result Date: 08/12/2022    ECHOCARDIOGRAM REPORT   Patient Name:   Dana Hughes Date of Exam: 08/12/2022 Medical Rec #:  [id number]        Height:       63.0 in Accession #:    [id number]       Weight:       207.3 lb Date of Birth:  13-Oct-1980         BSA:  1.963 m Patient Age:    41 years         BP:           109/28 mmHg Patient Gender: F                HR:           116 bpm. Exam Location:  Inpatient Procedure: 2D Echo, Cardiac Doppler and Color Doppler Indications:    I26.02 Pulmonary embolus  History:        Patient has prior history of Echocardiogram examinations, most                 recent 08/08/2020. Pulmonary embolism, Signs/Symptoms:Dyspnea,                 Shortness of Breath and Chest Pain; Risk Factors:Hypertension,                 Diabetes and Former Smoker.  Sonographer:    Dondra Prader RVT RCS Referring Phys: [id number] [facility] NARENDRA  Sonographer Comments: Patient is obese. Image acquisition challenging due to patient body habitus and Image acquisition challenging due to respiratory motion. IMPRESSIONS  1. Left ventricular ejection fraction, by estimation, is 65 to 70%. The left ventricle has normal function. The left ventricle has no regional wall motion abnormalities. There is mild concentric left ventricular hypertrophy. Indeterminate diastolic filling due to E-A fusion. There is the interventricular septum is flattened in systole and diastole, consistent with right ventricular pressure and volume overload.  2. RV McConnell's Sign suggestive of RV strain. Right ventricular systolic function is moderately reduced. The right ventricular size is moderately enlarged.  3. The mitral valve is  normal in structure. No evidence of mitral valve regurgitation. No evidence of mitral stenosis.  4. The aortic valve is tricuspid. Aortic valve regurgitation is not visualized. No aortic stenosis is present. Comparison(s): LV is more vigorous. RV is notably more dilated with worse function. Conclusion(s)/Recommendation(s): RV function worse in with the clinical concern of PE. Consistent with RV strain. Consider PERT team evaluation if clinically indicated. FINDINGS  Left Ventricle: Left ventricular ejection fraction, by estimation, is 65 to 70%. The left ventricle has normal function. The left ventricle has no regional wall motion abnormalities. The left ventricular internal cavity size was small. There is mild concentric left ventricular hypertrophy. The interventricular septum is flattened in systole and diastole, consistent with right ventricular pressure and volume overload. Indeterminate diastolic filling due to E-A fusion. Right Ventricle: RV McConnell's Sign suggestive of RV strain. The right ventricular size is moderately enlarged. No increase in right ventricular wall thickness. Right ventricular systolic function is moderately reduced. Left Atrium: Left atrial size was normal in size. Right Atrium: Right atrial size was normal in size. Pericardium: There is no evidence of pericardial effusion. The pericardial effusion is surrounding the apex. Mitral Valve: The mitral valve is normal in structure. No evidence of mitral valve regurgitation. No evidence of mitral valve stenosis. Tricuspid Valve: The tricuspid valve is normal in structure. Tricuspid valve regurgitation is not demonstrated. No evidence of tricuspid stenosis. Aortic Valve: The aortic valve is tricuspid. Aortic valve regurgitation is not visualized. No aortic stenosis is present. Aortic valve mean gradient measures 2.0 mmHg. Aortic valve peak gradient measures 3.3 mmHg. Pulmonic Valve: The pulmonic valve was not well visualized. Pulmonic valve  regurgitation is not visualized. No evidence of pulmonic stenosis. Aorta: The aortic root and ascending aorta are structurally normal, with no evidence of dilitation. IAS/Shunts: No atrial level  shunt detected by color flow Doppler.  LEFT VENTRICLE PLAX 2D LVIDd:         3.50 cm     Diastology LVIDs:         2.20 cm     LV e' medial:    6.09 cm/s LV PW:         1.20 cm     LV E/e' medial:  7.9 LV IVS:        1.30 cm     LV e' lateral:   12.80 cm/s LVOT diam:     2.40 cm     LV E/e' lateral: 3.8 LVOT Area:     4.52 cm  LV Volumes (MOD) LV vol d, MOD A2C: 64.8 ml LV vol d, MOD A4C: 64.4 ml LV vol s, MOD A4C: 21.7 ml LV SV MOD A4C:     64.4 ml RIGHT VENTRICLE             IVC RV Basal diam:  4.40 cm     IVC diam: 1.80 cm RV S prime:     10.50 cm/s TAPSE (M-mode): 1.6 cm LEFT ATRIUM             Index        RIGHT ATRIUM           Index LA diam:        2.40 cm 1.22 cm/m   RA Area:     16.50 cm LA Vol (A2C):   25.8 ml 13.14 ml/m  RA Volume:   51.70 ml  26.33 ml/m LA Vol (A4C):   14.6 ml 7.44 ml/m LA Biplane Vol: 20.5 ml 10.44 ml/m  AORTIC VALVE              PULMONIC VALVE AV Vmax:      90.70 cm/s  PV Vmax:       0.64 m/s AV Vmean:     61.600 cm/s PV Peak grad:  1.6 mmHg AV VTI:       0.110 m AV Peak Grad: 3.3 mmHg AV Mean Grad: 2.0 mmHg  AORTA Ao Root diam: 3.40 cm Ao Asc diam:  3.20 cm Ao Arch diam: 2.5 cm MITRAL VALVE               TRICUSPID VALVE MV Area (PHT): 3.97 cm    TR Peak grad:   53.9 mmHg MV Decel Time: 191 msec    TR Vmax:        367.00 cm/s MV E velocity: 48.40 cm/s MV A velocity: 80.35 cm/s  SHUNTS MV E/A ratio:  0.60        Systemic Diam: 2.40 cm Riley Lam MD Electronically signed by Riley Lam MD Signature Date/Time: 08/12/2022/12:52:37 PM    Final    VAS [place] LOWER EXTREMITY VENOUS (DVT)  Result Date: 08/12/2022  Lower Venous DVT Study Patient Name:  SHANETTA NICOLLS  Date of Exam:   08/12/2022 Medical Rec #: [id number]         Accession #:    [id number] Date of Birth: 06/12/1980           Patient Gender: F Patient Age:   50 years Exam Location:  [facility] Procedure:      VAS [place] LOWER EXTREMITY VENOUS (DVT) Referring Phys: Pia Mau --------------------------------------------------------------------------------  Indications: New Pulmonary embolism.  Risk Factors: History of PE 08/08/20. Comparison Study: Prior negative bilateral LEV done 08/08/20 Performing Technologist: Sherren Kerns RVS  Examination Guidelines: A complete evaluation  includes B-mode imaging, spectral Doppler, color Doppler, and power Doppler as needed of all accessible portions of each vessel. Bilateral testing is considered an integral part of a complete examination. Limited examinations for reoccurring indications may be performed as noted. The reflux portion of the exam is performed with the patient in reverse Trendelenburg.  +--------+---------------+---------+-----------+----------------+-------------+ RIGHT   CompressibilityPhasicitySpontaneityProperties      Thrombus                                                                 Aging         +--------+---------------+---------+-----------+----------------+-------------+ CFV     Full                               pulsatile                                                                waveforms                     +--------+---------------+---------+-----------+----------------+-------------+ SFJ     Full                                                             +--------+---------------+---------+-----------+----------------+-------------+ FV Prox Full                                                             +--------+---------------+---------+-----------+----------------+-------------+ FV Mid  Full                               pulsatile                                                                waveforms                      +--------+---------------+---------+-----------+----------------+-------------+ FV      Full                                                             Distal                                                                   +--------+---------------+---------+-----------+----------------+-------------+  PFV     Full                                                             +--------+---------------+---------+-----------+----------------+-------------+ POP     Full                               pulsatile                                                                waveforms                     +--------+---------------+---------+-----------+----------------+-------------+ PTV     Full                                                             +--------+---------------+---------+-----------+----------------+-------------+ PERO    Full                                                             +--------+---------------+---------+-----------+----------------+-------------+   +--------+---------------+---------+-----------+----------------+-------------+ LEFT    CompressibilityPhasicitySpontaneityProperties      Thrombus                                                                 Aging         +--------+---------------+---------+-----------+----------------+-------------+ CFV     Full                               pulsatile                                                                waveforms                     +--------+---------------+---------+-----------+----------------+-------------+ SFJ     Full                                                             +--------+---------------+---------+-----------+----------------+-------------+ FV  Prox Full                                                             +--------+---------------+---------+-----------+----------------+-------------+ FV Mid  Full                                pulsatile                                                                waveforms                     +--------+---------------+---------+-----------+----------------+-------------+ FV      Full                                                             Distal                                                                   +--------+---------------+---------+-----------+----------------+-------------+ PFV     Full                                                             +--------+---------------+---------+-----------+----------------+-------------+ POP     Full                               pulsatile                                                                waveforms                     +--------+---------------+---------+-----------+----------------+-------------+ PTV     Full                                                             +--------+---------------+---------+-----------+----------------+-------------+ PERO    Full                                                             +--------+---------------+---------+-----------+----------------+-------------+  Summary: BILATERAL: - No evidence of deep vein thrombosis seen in the lower extremities, bilaterally. -No evidence of popliteal cyst, bilaterally. RIGHT: pulsatile waveforms  LEFT: Pulsatile waveforms.  *See table(s) above for measurements and observations. Electronically signed by Sherald Hess MD on 08/12/2022 at 10:53:30 AM.    Final    CT Angio Chest PE W and/or Wo Contrast  Result Date: 08/12/2022 CLINICAL DATA:  Chest pain. EXAM: CT ANGIOGRAPHY CHEST WITH CONTRAST TECHNIQUE: Multidetector CT imaging of the chest was performed using the standard protocol during bolus administration of intravenous contrast. Multiplanar CT image reconstructions and MIPs were obtained to evaluate the vascular anatomy. RADIATION DOSE REDUCTION: This exam was performed  according to the departmental dose-optimization program which includes automated exposure control, adjustment of the mA and/or kV according to patient size and/or use of iterative reconstruction technique. CONTRAST:  75mL OMNIPAQUE IOHEXOL 350 MG/ML SOLN COMPARISON:  December 18, 2021 FINDINGS: Cardiovascular: Satisfactory opacification of the pulmonary arteries to the segmental level. Marked severity areas of intraluminal low attenuation are seen within the distal aspects of the bilateral pulmonary arteries. There is extension to involve multiple bilateral upper lobe, right middle lobe and bilateral lower lobe branches of the pulmonary arteries. No saddle embolus is seen. Normal heart size. There is right heart strain with an RV/LV ratio of 1.31. No pericardial effusion. Mediastinum/Nodes: No enlarged mediastinal, hilar, or axillary lymph nodes. Thyroid gland, trachea, and esophagus demonstrate no significant findings. Lungs/Pleura: Mild patchy anteromedial right upper lobe and posteromedial left lower lobe infiltrates are seen. There is no evidence of a pleural effusion or pneumothorax. Upper Abdomen: Surgical clips are seen within the gallbladder fossa. Musculoskeletal: No chest wall abnormality. No acute or significant osseous findings. Review of the MIP images confirms the above findings. IMPRESSION: 1. Marked severity bilateral pulmonary embolism with right heart strain (RV/LV Ratio = 1.31) which has been associated with an increased risk of morbidity and mortality. 2. Mild patchy right upper lobe and left lower lobe infiltrates. 3. Evidence of prior cholecystectomy. Electronically Signed   By: Aram Candela M.D.   On: 08/12/2022 00:16   DG Chest 2 View  Result Date: 08/11/2022 CLINICAL DATA:  Shortness of breath and productive cough. EXAM: CHEST - 2 VIEW COMPARISON:  PA and lateral 05/01/2022 FINDINGS: The heart size and mediastinal contours are within normal limits. Both lungs are clear. The visualized  skeletal structures are unremarkable. There are cholecystectomy clips in the upper abdomen. IMPRESSION: No evidence of acute chest disease or interval changes. Electronically Signed   By: Almira Bar M.D.   On: 08/11/2022 22:39    Labs:  CBC: Recent Labs    08/13/22 0257 08/13/22 2013 08/14/22 0130 08/14/22 0718  WBC 12.0* 10.2 10.2 9.7  HGB 9.4* 9.0* 8.8* 8.2*  HCT 32.6* 30.7* 30.4* 28.5*  PLT 349 284 300 207    COAGS: No results for input(s): "INR", "APTT" in the last 8760 hours.  BMP: Recent Labs    08/11/22 2205 08/12/22 0535 08/13/22 0257 08/14/22 0130  NA 136 136 136 136  K 2.9* 3.1* 3.3* 3.4*  CL 106 106 105 103  CO2 18* 18* 22 21*  GLUCOSE 256* 242* 168* 140*  BUN 6 6 6  5*  CALCIUM 8.7* 8.5* 8.7* 8.7*  CREATININE 0.78 0.74 0.69 0.80  GFRNONAA >60 >60 >60 >60    LIVER FUNCTION TESTS: Recent Labs    05/01/22 1845 08/11/22 2205  BILITOT 0.2* 0.4  AST 26 23  ALT 26 30  ALKPHOS  61 59  PROT 6.8 7.2  ALBUMIN 3.2* 3.1*    Assessment and Plan:  42 y.o. female inpatient. History of asthma, DM, anxiety/depression. PE ( in 2022 not on anticoagulation) Presented to the ED at [facility] on 4.20.24 with asthma exacerbation and productive cough. Found to elevated tropin, elevated d-dimer and a submassive PE. IR performed a PE lysis on 4.22.24. Pressures obtained and the sheath was removed on 4.23.24 by IR Techs.   Patient seen at bedside. Reporting improved breathing overall but states that she is out of breath with excerption. Access site unremarkable. Patient verbalizing that she will be on anticoagulation for "the rest of my life"  Patient to stable from IR perspective s/p PE Lysis further plans per CCS. please call IR with questions or concerns.  Electronically Signed: Alene Mires, NP 08/14/2022, 12:01 PM   I spent a total of 15 Minutes at the patient's bedside AND on the patient's hospital floor or unit, greater than 50% of which was  counseling/coordinating care for PE lysis

## 2022-08-15 ENCOUNTER — Other Ambulatory Visit (HOSPITAL_COMMUNITY): Payer: Self-pay

## 2022-08-15 DIAGNOSIS — Z87891 Personal history of nicotine dependence: Secondary | ICD-10-CM | POA: Diagnosis not present

## 2022-08-15 DIAGNOSIS — I519 Heart disease, unspecified: Secondary | ICD-10-CM | POA: Diagnosis not present

## 2022-08-15 DIAGNOSIS — I2694 Multiple subsegmental pulmonary emboli without acute cor pulmonale: Secondary | ICD-10-CM | POA: Diagnosis not present

## 2022-08-15 LAB — CBC
HCT: 29.4 % — ABNORMAL LOW (ref 36.0–46.0)
Hemoglobin: 8.4 g/dL — ABNORMAL LOW (ref 12.0–15.0)
MCH: 22.2 pg — ABNORMAL LOW (ref 26.0–34.0)
MCHC: 28.6 g/dL — ABNORMAL LOW (ref 30.0–36.0)
MCV: 77.6 fL — ABNORMAL LOW (ref 80.0–100.0)
Platelets: 344 10*3/uL (ref 150–400)
RBC: 3.79 MIL/uL — ABNORMAL LOW (ref 3.87–5.11)
RDW: 17.8 % — ABNORMAL HIGH (ref 11.5–15.5)
WBC: 9.6 10*3/uL (ref 4.0–10.5)
nRBC: 0.7 % — ABNORMAL HIGH (ref 0.0–0.2)

## 2022-08-15 LAB — BASIC METABOLIC PANEL
Anion gap: 10 (ref 5–15)
BUN: <5 mg/dL — ABNORMAL LOW (ref 6–20)
CO2: 23 mmol/L (ref 22–32)
Calcium: 8.7 mg/dL — ABNORMAL LOW (ref 8.9–10.3)
Chloride: 104 mmol/L (ref 98–111)
Creatinine, Ser: 0.78 mg/dL (ref 0.44–1.00)
GFR, Estimated: >60 mL/min (ref >60)
Glucose, Bld: 216 mg/dL — ABNORMAL HIGH (ref 70–99)
Potassium: 3.4 mmol/L — ABNORMAL LOW (ref 3.5–5.1)
Sodium: 137 mmol/L (ref 135–145)

## 2022-08-15 LAB — GLUCOSE, CAPILLARY
Glucose-Capillary: 207 mg/dL — ABNORMAL HIGH (ref 70–99)
Glucose-Capillary: 117 mg/dL — ABNORMAL HIGH (ref 70–99)
Glucose-Capillary: 198 mg/dL — ABNORMAL HIGH (ref 70–99)
Glucose-Capillary: 151 mg/dL — ABNORMAL HIGH (ref 70–99)

## 2022-08-15 MED ORDER — POTASSIUM CHLORIDE CRYS ER 20 MEQ PO TBCR
60.0000 meq | EXTENDED_RELEASE_TABLET | Freq: Once | ORAL | Status: AC
  Administered 2022-08-15: 60 meq via ORAL
  Filled 2022-08-15: qty 3

## 2022-08-15 MED ORDER — ORAL CARE MOUTH RINSE: 15.0000 mL | OROMUCOSAL | Status: DC | PRN

## 2022-08-15 MED ORDER — SODIUM CHLORIDE 0.9 % IV SOLN
510.0000 mg | Freq: Once | INTRAVENOUS | Status: AC
  Administered 2022-08-15: 510 mg via INTRAVENOUS
  Filled 2022-08-15: qty 17

## 2022-08-15 MED ORDER — FLUTICASONE FUROATE-VILANTEROL 100-25 MCG/ACT IN AEPB: 1.0000 | INHALATION_SPRAY | RESPIRATORY_TRACT | Status: DC | PRN

## 2022-08-15 NOTE — Progress Notes (Signed)
1930 patient alert x4 able to make all needs known on 4L [place] which is baseline for patient. Prn pain medication given for chronic back pain. Patient ambulated to bathroom while staff present.

## 2022-08-15 NOTE — Plan of Care (Signed)
  Problem: Coping: Goal: Ability to adjust to condition or change in health will improve Outcome: Progressing   Problem: Fluid Volume: Goal: Ability to maintain a balanced intake and output will improve Outcome: Progressing   Problem: Health Behavior/Discharge Planning: Goal: Ability to identify and utilize available resources and services will improve Outcome: Progressing Goal: Ability to manage health-related needs will improve Outcome: Progressing   Problem: Metabolic: Goal: Ability to maintain appropriate glucose levels will improve Outcome: Progressing   Problem: Nutritional: Goal: Maintenance of adequate nutrition will improve Outcome: Progressing   Problem: Skin Integrity: Goal: Risk for impaired skin integrity will decrease Outcome: Progressing   Problem: Tissue Perfusion: Goal: Adequacy of tissue perfusion will improve Outcome: Progressing   

## 2022-08-15 NOTE — TOC Progression Note (Signed)
Transition of Care [facility]) - Progression Note    Patient Details  Name: Dana Hughes MRN: [id number] Date of Birth: 1980-05-24  [facility] CM/SW Contact  [name], RN Phone Number: 08/15/2022, 4:24 PM  Clinical Narrative:    NCM spoke with patient, she is on 4 liters of oxygen and may need home oxygen at discharge, she does not have a preference of the agency to supply the oxygen.  NCM made referral to [facility] with [employer] to follow along to see if oxygen will be needed.  She will also need need a BSC and a rolling walker. [employer] will supply this as well.  She states her mom will transport her home at dc.  She is on eliquis and co pay is 4.00.         Expected Discharge Plan and Services                                               Social Determinants of Health (SDOH) Interventions SDOH Screenings   Food Insecurity: Food Insecurity Present (08/12/2022)  Housing: Medium Risk (08/12/2022)  Transportation Needs: No Transportation Needs (08/12/2022)  Utilities: Not At Risk (08/12/2022)  Depression (PHQ2-9): Medium Risk (05/18/2022)  Financial Resource Strain: Medium Risk (11/08/2021)  Tobacco Use: Medium Risk (08/14/2022)    Readmission Risk Interventions    08/12/2020   12:56 PM  Readmission Risk Prevention Plan  Transportation Screening Complete  PCP or Specialist Appt within 3-5 Days Complete  Social Work Consult for Recovery Care Planning/Counseling Complete  Palliative Care Screening Not Applicable  Medication Review Oceanographer) Referral to Pharmacy

## 2022-08-15 NOTE — Progress Notes (Signed)
Approximately 1450--Pt arrived to room 3E14. Pt A&O x 4 and no S/S of distress at this time. VSS. Full assessment completed by this RN. Pt connected to telemetry, all fall precautions in place.

## 2022-08-15 NOTE — Progress Notes (Signed)
Physical Therapy Treatment Patient Details Name: Dana Hughes MRN: [id number] DOB: 1980-09-06 Today's Date: 08/15/2022   History of Present Illness Pt is a 42 year old female who presented to ED on 08/11/22 with worsening lightheadedness and chest pain over the last 4 days in the setting of a recent GI illness. Imaging revealed bilat PEs with R heart strain. PMH: submassive PE in 2022, DM II, HTN, bipolar 2 disorder, iron deficiency anemia    PT Comments    Pt is demonstrating improve aerobic endurance and activity tolerance, ambulating an increased distance today. However, pt required several standing rest breaks, cues for pursed lip breathing, and cues for activity pacing to do so. Pt educated on frequent mobility to improve her endurance and reduce her risk for further clots to form. Pt was able to wean down from 4L to 3L and maintain her sats >/= 92% even when ambulating, but when attempting to wean down to 2L her sats dropped to 79% while ambulating. They rebounded to the 90s% with an increase to 3L O2 and a standing rest break though. Will continue to follow acutely.     Recommendations for follow up therapy are one component of a multi-disciplinary discharge planning process, led by the attending physician.  Recommendations may be updated based on patient status, additional functional criteria and insurance authorization.  Follow Up Recommendations       Assistance Recommended at Discharge Intermittent Supervision/Assistance  Patient can return home with the following A little help with walking and/or transfers;A little help with bathing/dressing/bathroom;Assist for transportation   Equipment Recommendations  Rollator (4 wheels) (with reassess as pt's breathing improves)    Recommendations for Other Services       Precautions / Restrictions Precautions Precautions: Fall;Other (comment) Precaution Comments: SOB, watch SpO2 Restrictions Weight Bearing Restrictions: No      Mobility  Bed Mobility Overal bed mobility: Modified Independent             General bed mobility comments: HOB elevated, no assistance needed    Transfers Overall transfer level: Needs assistance Equipment used: None Transfers: Sit to/from Stand Sit to Stand: Supervision           General transfer comment: Increased time to stand, but no LOB, supervision for safety, 6x (5x with no UE usage)    Ambulation/Gait Ambulation/Gait assistance: Min guard Gait Distance (Feet): 220 Feet Assistive device: None Gait Pattern/deviations: Step-through pattern, Decreased stride length, Wide base of support Gait velocity: dec Gait velocity interpretation: <1.31 ft/sec, indicative of household ambulator   General Gait Details: Pt taking short, slow steps, cuing pt to pace herself and cuing to take standing rest breaks when pt would appear to get SOB, >x5 standing rest breaks. Occasional sway when ambulating but recovers without assistance. SpO2 >/= 92% on 3L but decreased to 79% on 2L, rebounding quickly to 90s% on 3L with standing rest break   Stairs             Wheelchair Mobility    Modified Rankin (Stroke Patients Only)       Balance Overall balance assessment: Needs assistance Sitting-balance support: Feet supported, No upper extremity supported Sitting balance-Leahy Scale: Good     Standing balance support: No upper extremity supported Standing balance-Leahy Scale: Fair Standing balance comment: Occasional sway when ambulating but recovers without assistance                            Cognition Arousal/Alertness: Awake/alert Behavior  During Therapy: WFL for tasks assessed/performed Overall Cognitive Status: Within Functional Limits for tasks assessed                                          Exercises Other Exercises Other Exercises: sit <> stand from elevated EOB to reduce knee discomfort, x5 reps without UE usage, slow to  complete with pauses between reps    General Comments General comments (skin integrity, edema, etc.): SpO2 97% on 4L upon arrival, reduced to 3L, SpO2 >/= 92% on 3L when ambulating but decreased to 79% on 2L when ambulating, rebounding quickly to 90s% on 3L with standing rest break, left pt on 3L with SpO2 >/= 92% at rest end of session      Pertinent Vitals/Pain Pain Assessment Pain Assessment: Faces Faces Pain Scale: Hurts little more Pain Location: chest Pain Descriptors / Indicators: Heaviness, Tightness, Other (Comment) (difficulty breathing with DOE) Pain Intervention(s): Limited activity within patient's tolerance, Monitored during session    Home Living                          Prior Function            PT Goals (current goals can now be found in the care plan section) Acute Rehab PT Goals Patient Stated Goal: get better PT Goal Formulation: With patient Time For Goal Achievement: 08/27/22 Potential to Achieve Goals: Good Progress towards PT goals: Progressing toward goals    Frequency    Min 1X/week      PT Plan Equipment recommendations need to be updated    Co-evaluation              AM-PAC PT "6 Clicks" Mobility   Outcome Measure  Help needed turning from your back to your side while in a flat bed without using bedrails?: None Help needed moving from lying on your back to sitting on the side of a flat bed without using bedrails?: None Help needed moving to and from a bed to a chair (including a wheelchair)?: A Little Help needed standing up from a chair using your arms (e.g., wheelchair or bedside chair)?: A Little Help needed to walk in hospital room?: A Little Help needed climbing 3-5 steps with a railing? : A Little 6 Click Score: 20    End of Session Equipment Utilized During Treatment: Oxygen;Gait belt (2-4LO2 via ) Activity Tolerance: Patient tolerated treatment well Patient left: with call bell/phone within reach;in bed;with  bed alarm set Nurse Communication: Other (comment) (sats) PT Visit Diagnosis: Difficulty in walking, not elsewhere classified (R26.2);Unsteadiness on feet (R26.81)     Time: 8295-6213 PT Time Calculation (min) (ACUTE ONLY): 28 min  Charges:  $Gait Training: 8-22 mins $Therapeutic Exercise: 8-22 mins                     Raymond Gurney, PT, DPT Acute Rehabilitation Services  Office: [phone]    Jewel Baize 08/15/2022, 4:28 PM

## 2022-08-16 ENCOUNTER — Other Ambulatory Visit (HOSPITAL_COMMUNITY): Payer: Self-pay

## 2022-08-16 DIAGNOSIS — I2694 Multiple subsegmental pulmonary emboli without acute cor pulmonale: Secondary | ICD-10-CM | POA: Diagnosis not present

## 2022-08-16 DIAGNOSIS — I519 Heart disease, unspecified: Secondary | ICD-10-CM | POA: Diagnosis not present

## 2022-08-16 DIAGNOSIS — Z87891 Personal history of nicotine dependence: Secondary | ICD-10-CM | POA: Diagnosis not present

## 2022-08-16 LAB — BASIC METABOLIC PANEL
Anion gap: 9 (ref 5–15)
BUN: <5 mg/dL — ABNORMAL LOW (ref 6–20)
CO2: 22 mmol/L (ref 22–32)
Calcium: 9 mg/dL (ref 8.9–10.3)
Chloride: 106 mmol/L (ref 98–111)
Creatinine, Ser: 0.91 mg/dL (ref 0.44–1.00)
GFR, Estimated: >60 mL/min (ref >60)
Glucose, Bld: 186 mg/dL — ABNORMAL HIGH (ref 70–99)
Potassium: 3.4 mmol/L — ABNORMAL LOW (ref 3.5–5.1)
Sodium: 137 mmol/L (ref 135–145)

## 2022-08-16 LAB — CBC
HCT: 30.2 % — ABNORMAL LOW (ref 36.0–46.0)
Hemoglobin: 8.5 g/dL — ABNORMAL LOW (ref 12.0–15.0)
MCH: 22.1 pg — ABNORMAL LOW (ref 26.0–34.0)
MCHC: 28.1 g/dL — ABNORMAL LOW (ref 30.0–36.0)
MCV: 78.4 fL — ABNORMAL LOW (ref 80.0–100.0)
Platelets: 405 10*3/uL — ABNORMAL HIGH (ref 150–400)
RBC: 3.85 MIL/uL — ABNORMAL LOW (ref 3.87–5.11)
RDW: 18.3 % — ABNORMAL HIGH (ref 11.5–15.5)
WBC: 11.8 10*3/uL — ABNORMAL HIGH (ref 4.0–10.5)
nRBC: 1.7 % — ABNORMAL HIGH (ref 0.0–0.2)

## 2022-08-16 LAB — GLUCOSE, CAPILLARY
Glucose-Capillary: 113 mg/dL — ABNORMAL HIGH (ref 70–99)
Glucose-Capillary: 177 mg/dL — ABNORMAL HIGH (ref 70–99)

## 2022-08-16 MED ORDER — APIXABAN 5 MG PO TABS
5.0000 mg | ORAL_TABLET | Freq: Two times a day (BID) | ORAL | 1 refills | Status: DC
  Filled 2022-08-16: qty 60, 30d supply, fill #0

## 2022-08-16 MED ORDER — DULERA 200-5 MCG/ACT IN AERO
2.0000 | INHALATION_SPRAY | Freq: Two times a day (BID) | RESPIRATORY_TRACT | 2 refills | Status: AC
  Filled 2022-08-16: qty 13, 30d supply, fill #0
  Filled 2023-05-31 – 2023-08-07 (×3): qty 13, 30d supply, fill #1

## 2022-08-16 MED ORDER — POTASSIUM CHLORIDE CRYS ER 20 MEQ PO TBCR
20.0000 meq | EXTENDED_RELEASE_TABLET | Freq: Every day | ORAL | 0 refills | Status: DC
  Filled 2022-08-16: qty 3, 3d supply, fill #0

## 2022-08-16 MED ORDER — POTASSIUM CHLORIDE CRYS ER 20 MEQ PO TBCR
60.0000 meq | EXTENDED_RELEASE_TABLET | Freq: Once | ORAL | Status: AC
  Administered 2022-08-16: 60 meq via ORAL
  Filled 2022-08-16: qty 3

## 2022-08-16 MED ORDER — IRON POLYSACCH CMPLX-B12-FA 150-0.025-1 MG PO CAPS
1.0000 | ORAL_CAPSULE | ORAL | 2 refills | Status: AC
  Filled 2022-08-16: qty 15, 30d supply, fill #0

## 2022-08-16 MED ORDER — APIXABAN 5 MG PO TABS
5.0000 mg | ORAL_TABLET | ORAL | 0 refills | Status: DC
  Filled 2022-08-16: qty 78, 34d supply, fill #0

## 2022-08-16 NOTE — Plan of Care (Signed)

## 2022-08-16 NOTE — Progress Notes (Signed)
SATURATION QUALIFICATIONS: (This note is used to comply with regulatory documentation for home oxygen)  Patient Saturations on 4L at Rest = 97%  Patient Saturations on 4L while Ambulating = 94%  Patient Saturations on 4 Liters of oxygen while Ambulating = 98%  Please briefly explain why patient needs home oxygen:

## 2022-08-16 NOTE — Progress Notes (Addendum)
Patient confined to room without bathroom, can not ambulate to bathroom, needs bedside commode due to weakness. 

## 2022-08-16 NOTE — Discharge Summary (Signed)
Name: Dana Hughes MRN: [id number] DOB: 1981-02-20 42 y.o. PCP: Evlyn Kanner, MD  Date of Admission: 08/11/2022  9:45 PM Date of Discharge: 08/16/2022 2:07 PM Attending Physician: Dr. Oswaldo Done  Discharge Diagnosis: Principal Problem:   Pulmonary embolism Active Problems:   Pulmonary embolus    Discharge Medications: Allergies as of 08/16/2022       Reactions   Mushroom Extract Complex Anaphylaxis   Latex Itching, Swelling   Penicillins Itching   Has patient had a PCN reaction causing immediate rash, facial/tongue/throat swelling, SOB or lightheadedness with hypotension: No Has patient had a PCN reaction causing severe rash involving mucus membranes or skin necrosis: No Has patient had a PCN reaction that required hospitalization No Has patient had a PCN reaction occurring within the last 10 years: No If all of the above answers are "NO", then may proceed with Cephalosporin use.        Medication List     STOP taking these medications    ibuprofen 600 MG tablet Commonly known as: ADVIL   lidocaine 5 % Commonly known as: Lidoderm   metoprolol tartrate 100 MG tablet Commonly known as: LOPRESSOR   metroNIDAZOLE 0.75 % vaginal gel Commonly known as: METROGEL   True Metrix Meter w/Device Kit       TAKE these medications    acetaminophen 500 MG tablet Commonly known as: TYLENOL Take 2 tablets (1,000 mg total) by mouth every 6 (six) hours as needed for mild pain (or Fever >/= 101).   albuterol (2.5 MG/3ML) 0.083% nebulizer solution Commonly known as: PROVENTIL Take 3 mLs (2.5 mg total) by nebulization every 6 (six) hours as needed for wheezing or shortness of breath.   albuterol 108 (90 Base) MCG/ACT inhaler Commonly known as: VENTOLIN HFA Inhale 2 puffs into the lungs every 6 (six) hours as needed for wheezing or shortness of breath. Reported on 09/29/2015   AMBULATORY NON FORMULARY MEDICATION Nitroglycerin 0.125% gel - apply a pea size amount to your  rectum three times daily x 6-8 weeks.   amLODipine 5 MG tablet Commonly known as: NORVASC Take 1 tablet (5 mg total) by mouth daily.   ARIPiprazole 20 MG tablet Commonly known as: ABILIFY Take 1 tablet (20 mg total) by mouth daily.   artificial tears Oint ophthalmic ointment Commonly known as: LACRILUBE Place into both eyes at bedtime as needed for dry eyes.   bisacodyl 5 MG EC tablet Commonly known as: Dulcolax Take 1 tablet (5 mg total) by mouth daily as needed for moderate constipation.   diclofenac Sodium 1 % Gel Commonly known as: Voltaren Apply 4 g topically 4 (four) times daily.   dicyclomine 10 MG capsule Commonly known as: BENTYL Take 1 capsule (10 mg total) by mouth 4 (four) times daily as needed for spasms (abdominal pain).   Dulera 200-5 MCG/ACT Aero Generic drug: mometasone-formoterol Inhale 2 puffs into the lungs 2 (two) times daily.   Eliquis 5 MG Tabs tablet Generic drug: apixaban Take 2 tablets (10 mg total) by mouth 2 (two) times daily for 9 doses then 1 tablet twice daily thereafter   apixaban 5 MG Tabs tablet Commonly known as: ELIQUIS Take 1 tablet (5 mg total) by mouth 2 (two) times daily. Start taking on: August 21, 2022   fluticasone 50 MCG/ACT nasal spray Commonly known as: FLONASE PLACE 1 SPRAY INTO BOTH NOSTRILS IN THE MORNING AND AT BEDTIME. What changed: how much to take   gabapentin 300 MG capsule Commonly known as: Neurontin Take  1 capsule (300 mg total) by mouth 3 (three) times daily.   insulin aspart 100 UNIT/ML FlexPen Commonly known as: NOVOLOG Inject 10 Units into the skin 2 (two) times daily with a meal.   lamoTRIgine 150 MG tablet Commonly known as: LaMICtal Take 1 tablet (150 mg total) by mouth daily.   Lantus SoloStar 100 UNIT/ML Solostar Pen Generic drug: insulin glargine Inject 20 Units into the skin daily at 10pm.   meclizine 25 MG tablet Commonly known as: ANTIVERT Take 1 tablet (25 mg total) by mouth 3 (three)  times daily as needed for dizziness.   metFORMIN 1000 MG tablet Commonly known as: GLUCOPHAGE Take 1 tablet (1,000 mg total) by mouth 2 (two) times daily with a meal.   mirtazapine 45 MG tablet Commonly known as: REMERON Take 1 tablet (45 mg total) by mouth at bedtime.   omeprazole 40 MG capsule Commonly known as: PRILOSEC Take 1 capsule (40 mg total) by mouth daily.   Poly-Iron 150 Forte 150-0.025-1 MG Caps Generic drug: Iron Polysacch Cmplx-B12-FA Take 1 capsule by mouth every other day.   polyethylene glycol powder 17 GM/SCOOP powder Commonly known as: GLYCOLAX/MIRALAX Take 127.5 g by mouth daily.   potassium chloride SA 20 MEQ tablet Commonly known as: KLOR-CON M Take 1 tablet (20 mEq total) by mouth daily for 3 days.   senna-docusate 8.6-50 MG tablet Commonly known as: Senokot S Take 1 tablet by mouth daily.   SUMAtriptan 50 MG tablet Commonly known as: Imitrex Take 1 tablet (50 mg total) by mouth every 2 (two) hours as needed for migraine. May repeat in 2 hours if headache persists or recurs. Please take no more than 3 tablets (150 mg) in 24 hours.   Tasimelteon 20 MG Caps Commonly known as: Hetlioz Take 20 mg by mouth at bedtime.   TechLite Pen Needles 32G X 4 MM Misc Generic drug: Insulin Pen Needle Use as directed 4 times daily.   True Metrix Blood Glucose Test test strip Generic drug: glucose blood Check your sugar in the morning before you eat breakfast, and one hour after a meal.   TRUEplus Lancets 28G Misc USE AS DIRECTED   Trulicity 0.75 MG/0.5ML Sopn Generic drug: Dulaglutide Inject 0.75 mg into the skin once a week.               Durable Medical Equipment  (From admission, onward)           Start     Ordered   08/16/22 1340  For home use only DME oxygen  Once       Question Answer Comment  Length of Need 6 Months   Mode or (Route) Nasal cannula   Liters per Minute 4   Frequency Continuous (stationary and portable oxygen unit  needed)   Oxygen conserving device Yes   Oxygen delivery system Gas      08/16/22 1340   08/16/22 1239  For home use only DME 4 wheeled rolling walker with seat  Once       Question:  Patient needs a walker to treat with the following condition  Answer:  Weakness   08/16/22 1238   08/15/22 1557  For home use only DME Bedside commode  Once       Question:  Patient needs a bedside commode to treat with the following condition  Answer:  Weakness   08/15/22 1556              Discharge Care Instructions  (From  admission, onward)           Start     Ordered   08/16/22 0000  No dressing needed       Comments: Keep your neck area clean with gentle soap and dry thoroughly. No dressing needed   08/16/22 1131            Disposition and follow-up:   Dana Hughes was discharged from [facility] in Stable condition.  At the hospital follow up visit please address:  1.  Follow-up:  Unprovoked pulmonary Embolism Concern for pulmonary hypertension -Monitor VS, oxygen requirement, and symptom improvement -Ensure adherence to Eliquis therapy -Monitor for bleeding signs or symptoms -Monitor catheter insertion site for signs of infection -Consider hypercoagulable work up -Encourage follow up at Pulmonology clinic  Iron deficiency anemia Thrombocytosis with acanthocytes on smear Hx of uterine fibroids and colonic polyps  -Monitor PO iron therapy -Monitor cbc at discharge follow up -Referral to gastroenterology and Gynecology  Hypokalemia -Monitor signs and symptoms as well as serum potassium  Hypertension -Monitor BP and consider titrating therapy at follow up  DM -Ensure Trulicity adherence and education -Monitor BG at discharge  Asthma -Monitor symptoms and adherence to therapy  Schizophrenia Depression/Anxiety -Ensure appropriate medication dosage   2.  Labs / imaging needed at time of follow-up: CBC, K, renal function  3.  Pending  labs/ test needing follow-up: None  4.  Medication Changes  New medications -Eliquis 10 mg BID until 4/29, 5 mg BID indefinitely thereafter  -Dulera 2 puffs BID for asthma -Iron 150 mg - every other day -Potassium 20 mqE daily for 3 days  Stopped: - Carvedilol    Follow-up Appointments:  Follow-up Information     Hunsucker, Lesia Sago, MD Follow up in 1 month(s).   Specialty: Pulmonary Disease Why: Please call office after discharge to arrange appointment if not already scheduled Contact information: [address] [place] [address] [phone]         Llc, Tyna [facility] Follow up.   Why: rolling walker, BSC Contact information: [address] [place] [address] [phone]         Adron Bene, MD Follow up on 08/20/2022.   Specialty: Internal Medicine Why: at 2:45PM Contact information: [address] [place] [address] [phone]                 Hospital Course by problem list:  Acute, unprovoked sub-massive bilateral pulmonary embolism Hx of pulmonary embolism 2022 Concern for pulmonary hypertension Presented to the ED with 1-week period with chest pain and shortness of breath. CTA chest bilateral PE with RV/LV 1.31 and patchy RUL and LL infiltrates. TTE showed LV EF 60-65%, positive McConnell's sign with moderate RV enlargement. Negative LE VAS [place] for DVT. Patient with persistent tachycardia and dyspnea with 3-4L oxygen requirement with limited functional state. Pulmonology was consulted during hospitalization. Ms. Bacigalupo was initiated on heparin infusion therapy, ceftriaxone, azithromycin for presumed community acquired pneumonia prior to undergoing catheter directed lytic therapy on 4/22. patient was observed in the ICU for 24 hours after which was transitioned to the IMTS service. Eliquis therapy on 08/14/2022 with stable hemoglobin and no evidence of bleeding. Patient's HR 80-100 and >92% ambulatory  oxygen saturation at discharge. Patient received a walker and oxygen tank and 30 day supply of Eliquis. This was thought to be an unprovoked PE. Ensure age-appropriate cancer screenings and hypercoagulable work up at follow up. There may also  be a component of pulmonary hypertension in setting of chronic emboli vs suspected lung disease. Ms. Sorrels is aware that she should remain on anticoagulation indefinitely. Patient is to follow up with Dr. Judeth Horn at [facility] pulmonology on 5/30.  Iron deficiency anemia Patient with a history of iron deficiency anemia of unclear etiology confirmed during this hospitalization. Known history of endometriosis, uterine fibroids, and menorrhagia. Patient is also due for repeat colonoscopy as patient was not able to complete bowel prep for most recent one. Patient was managed with 2 doses of 510 mg FerraHeme with stable Hgb  of 8.5 at discharge. Patient is to continue PO iron therapy and follow up with Gynecology and Gastroenterology.    Thrombocytosis Chronic history with normal PLT on admission. Smear revealed acanthocytes thought to be in the setting of chronic anemia. TSH  and CMP within normal limits during this hospitalization. Other differentials include drug induced and myelodysplastic syndrome; if clinical suspicion at follow up, consider work up. Consider treating anemia and follow up studies as above. Continue to monitor PLTs in subsequent visits.  Asthma Reports history of asthma, no PFT on file. Patient started on Dulera during this admission with same ICS-LABA as rescue therapy.  Hypertension Held in the setting of PE and low normal blood pressures. Continued at discharge.  Insulin dependent type 2 diabetes mellitus Last A1c of 12.6%. Home regimen of metformin, lantus 20 U QHS and novolog 10 U BID. She was also prescribed Trulicity at recent [facility] appointment but patient was unaware on how to use it. Patient received diabetes education during this  admission and will be discharge on her home regimen.   Hypokalemia Secondary to gastroenteritis, now resolving. She was discharged on a short course of potassium supplementation.  Schizophrenia Depression/Anxiety Continued on Abilify and Lacmictal  Polysubstance use Continue to encourage cessation of know cocaine and marijuana use  Discharge Subjective: Patient was able to sleep well overnight and improvement in dyspnea at baseline and with ambulation. One loose bowel movement overnight, but improving.  Coughing up a small amount of white mucous. Denies chest pain, hemoptysis, hematuria, or hematochezia. Catheter insertion site without bandage. No neck pain today.  Discharge Exam:   Blood pressure 116/84, pulse 99, temperature 98.4 F (36.9 C), temperature source Oral, resp. rate 18, height 5\' 3"  (1.6 m), weight 95.4 kg, SpO2 95 %.  Constitutional: Middle age woman sitting in bed in NAD HENT: moist MM Neck: R anterior neck insertion site, uncovered, clean. No tenderness, erythema or drainage. Cardiovascular: regular rate and rhythm, no m/r/g JVD Pulmonary/Chest: Normal work of breathing on 2L [place], clear to auscultation bilaterally, no wheezing on exam  Abdominal: soft, non-tender, non-distended.  Neurological: alert & oriented x 3 MSK: No pitting edema Skin: warm and dry Psych: Normal mood and affect  Pertinent Labs, Studies, and Procedures:     Latest Ref Rng & Units 08/16/2022   12:42 AM 08/15/2022    7:46 AM 08/14/2022   12:53 PM  CBC  WBC 4.0 - 10.5 K/uL 11.8  9.6  9.3   Hemoglobin 12.0 - 15.0 g/dL 8.5  8.4  8.6   Hematocrit 36.0 - 46.0 % 30.2  29.4  29.5   Platelets 150 - 400 K/uL 405  344  292        Latest Ref Rng & Units 08/16/2022   12:42 AM 08/15/2022    7:46 AM 08/14/2022    1:30 AM  CMP  Glucose 70 - 99 mg/dL 865  784  696  BUN 6 - 20 mg/dL <5  <5  5   Creatinine 0.44 - 1.00 mg/dL 4.78  2.95  6.21   Sodium 135 - 145 mmol/L 137  137  136   Potassium 3.5 - 5.1  mmol/L 3.4  3.4  3.4   Chloride 98 - 111 mmol/L 106  104  103   CO2 22 - 32 mmol/L Calcium 8.9 - 10.3 mg/dL 9.0  8.7  8.7     ECHOCARDIOGRAM COMPLETE  Result Date: 08/12/2022    ECHOCARDIOGRAM REPORT   Patient Name:   SILAS SEDAM Date of Exam: 08/12/2022 Medical Rec #:  [id number]        Height:       63.0 in Accession #:    [id number]       Weight:       207.3 lb Date of Birth:  1981/04/19         BSA:          1.963 m Patient Age:    41 years         BP:           109/28 mmHg Patient Gender: F                HR:           116 bpm. Exam Location:  Inpatient Procedure: 2D Echo, Cardiac Doppler and Color Doppler Indications:    I26.02 Pulmonary embolus  History:        Patient has prior history of Echocardiogram examinations, most                 recent 08/08/2020. Pulmonary embolism, Signs/Symptoms:Dyspnea,                 Shortness of Breath and Chest Pain; Risk Factors:Hypertension,                 Diabetes and Former Smoker.  Sonographer:    Dondra Prader RVT RCS Referring Phys: [id number] [employer] NARENDRA  Sonographer Comments: Patient is obese. Image acquisition challenging due to patient body habitus and Image acquisition challenging due to respiratory motion. IMPRESSIONS  1. Left ventricular ejection fraction, by estimation, is 65 to 70%. The left ventricle has normal function. The left ventricle has no regional wall motion abnormalities. There is mild concentric left ventricular hypertrophy. Indeterminate diastolic filling due to E-A fusion. There is the interventricular septum is flattened in systole and diastole, consistent with right ventricular pressure and volume overload.  2. RV McConnell's Sign suggestive of RV strain. Right ventricular systolic function is moderately reduced. The right ventricular size is moderately enlarged.  3. The mitral valve is normal in structure. No evidence of mitral valve regurgitation. No evidence of mitral stenosis.  4. The aortic valve is tricuspid.  Aortic valve regurgitation is not visualized. No aortic stenosis is present. Comparison(s): LV is more vigorous. RV is notably more dilated with worse function. Conclusion(s)/Recommendation(s): RV function worse in with the clinical concern of PE. Consistent with RV strain. Consider PERT team evaluation if clinically indicated. FINDINGS  Left Ventricle: Left ventricular ejection fraction, by estimation, is 65 to 70%. The left ventricle has normal function. The left ventricle has no regional wall motion abnormalities. The left ventricular internal cavity size was small. There is mild concentric left ventricular hypertrophy. The interventricular septum is flattened in systole and diastole, consistent with right ventricular pressure and volume overload. Indeterminate diastolic filling due to E-A fusion.  Right Ventricle: RV McConnell's Sign suggestive of RV strain. The right ventricular size is moderately enlarged. No increase in right ventricular wall thickness. Right ventricular systolic function is moderately reduced. Left Atrium: Left atrial size was normal in size. Right Atrium: Right atrial size was normal in size. Pericardium: There is no evidence of pericardial effusion. The pericardial effusion is surrounding the apex. Mitral Valve: The mitral valve is normal in structure. No evidence of mitral valve regurgitation. No evidence of mitral valve stenosis. Tricuspid Valve: The tricuspid valve is normal in structure. Tricuspid valve regurgitation is not demonstrated. No evidence of tricuspid stenosis. Aortic Valve: The aortic valve is tricuspid. Aortic valve regurgitation is not visualized. No aortic stenosis is present. Aortic valve mean gradient measures 2.0 mmHg. Aortic valve peak gradient measures 3.3 mmHg. Pulmonic Valve: The pulmonic valve was not well visualized. Pulmonic valve regurgitation is not visualized. No evidence of pulmonic stenosis. Aorta: The aortic root and ascending aorta are structurally  normal, with no evidence of dilitation. IAS/Shunts: No atrial level shunt detected by color flow Doppler.  LEFT VENTRICLE PLAX 2D LVIDd:         3.50 cm     Diastology LVIDs:         2.20 cm     LV e' medial:    6.09 cm/s LV PW:         1.20 cm     LV E/e' medial:  7.9 LV IVS:        1.30 cm     LV e' lateral:   12.80 cm/s LVOT diam:     2.40 cm     LV E/e' lateral: 3.8 LVOT Area:     4.52 cm  LV Volumes (MOD) LV vol d, MOD A2C: 64.8 ml LV vol d, MOD A4C: 64.4 ml LV vol s, MOD A4C: 21.7 ml LV SV MOD A4C:     64.4 ml RIGHT VENTRICLE             IVC RV Basal diam:  4.40 cm     IVC diam: 1.80 cm RV S prime:     10.50 cm/s TAPSE (M-mode): 1.6 cm LEFT ATRIUM             Index        RIGHT ATRIUM           Index LA diam:        2.40 cm 1.22 cm/m   RA Area:     16.50 cm LA Vol (A2C):   25.8 ml 13.14 ml/m  RA Volume:   51.70 ml  26.33 ml/m LA Vol (A4C):   14.6 ml 7.44 ml/m LA Biplane Vol: 20.5 ml 10.44 ml/m  AORTIC VALVE              PULMONIC VALVE AV Vmax:      90.70 cm/s  PV Vmax:       0.64 m/s AV Vmean:     61.600 cm/s PV Peak grad:  1.6 mmHg AV VTI:       0.110 m AV Peak Grad: 3.3 mmHg AV Mean Grad: 2.0 mmHg  AORTA Ao Root diam: 3.40 cm Ao Asc diam:  3.20 cm Ao Arch diam: 2.5 cm MITRAL VALVE               TRICUSPID VALVE MV Area (PHT): 3.97 cm    TR Peak grad:   53.9 mmHg MV Decel Time: 191 msec    TR Vmax:  367.00 cm/s MV E velocity: 48.40 cm/s MV A velocity: 80.35 cm/s  SHUNTS MV E/A ratio:  0.60        Systemic Diam: 2.40 cm Riley Lam MD Electronically signed by Riley Lam MD Signature Date/Time: 08/12/2022/12:52:37 PM    Final    VAS [place] LOWER EXTREMITY VENOUS (DVT)  Result Date: 08/12/2022  Lower Venous DVT Study Patient Name:  ROMIE KEEBLE  Date of Exam:   08/12/2022 Medical Rec #: [id number]         Accession #:    [id number] Date of Birth: Jan 26, 1981          Patient Gender: F Patient Age:   53 years Exam Location:  [facility] Procedure:      VAS [place] LOWER EXTREMITY  VENOUS (DVT) Referring Phys: Pia Mau --------------------------------------------------------------------------------  Indications: New Pulmonary embolism.  Risk Factors: History of PE 08/08/20. Comparison Study: Prior negative bilateral LEV done 08/08/20 Performing Technologist: Sherren Kerns RVS  Examination Guidelines: A complete evaluation includes B-mode imaging, spectral Doppler, color Doppler, and power Doppler as needed of all accessible portions of each vessel. Bilateral testing is considered an integral part of a complete examination. Limited examinations for reoccurring indications may be performed as noted. The reflux portion of the exam is performed with the patient in reverse Trendelenburg.  +--------+---------------+---------+-----------+----------------+-------------+ RIGHT   CompressibilityPhasicitySpontaneityProperties      Thrombus                                                                 Aging         +--------+---------------+---------+-----------+----------------+-------------+ CFV     Full                               pulsatile                                                                waveforms                     +--------+---------------+---------+-----------+----------------+-------------+ SFJ     Full                                                             +--------+---------------+---------+-----------+----------------+-------------+ FV Prox Full                                                             +--------+---------------+---------+-----------+----------------+-------------+ FV Mid  Full  pulsatile                                                                waveforms                     +--------+---------------+---------+-----------+----------------+-------------+ FV      Full                                                             Distal                                                                    +--------+---------------+---------+-----------+----------------+-------------+ PFV     Full                                                             +--------+---------------+---------+-----------+----------------+-------------+ POP     Full                               pulsatile                                                                waveforms                     +--------+---------------+---------+-----------+----------------+-------------+ PTV     Full                                                             +--------+---------------+---------+-----------+----------------+-------------+ PERO    Full                                                             +--------+---------------+---------+-----------+----------------+-------------+   +--------+---------------+---------+-----------+----------------+-------------+ LEFT    CompressibilityPhasicitySpontaneityProperties      Thrombus  Aging         +--------+---------------+---------+-----------+----------------+-------------+ CFV     Full                               pulsatile                                                                waveforms                     +--------+---------------+---------+-----------+----------------+-------------+ SFJ     Full                                                             +--------+---------------+---------+-----------+----------------+-------------+ FV Prox Full                                                             +--------+---------------+---------+-----------+----------------+-------------+ FV Mid  Full                               pulsatile                                                                waveforms                      +--------+---------------+---------+-----------+----------------+-------------+ FV      Full                                                             Distal                                                                   +--------+---------------+---------+-----------+----------------+-------------+ PFV     Full                                                             +--------+---------------+---------+-----------+----------------+-------------+ POP     Full  pulsatile                                                                waveforms                     +--------+---------------+---------+-----------+----------------+-------------+ PTV     Full                                                             +--------+---------------+---------+-----------+----------------+-------------+ PERO    Full                                                             +--------+---------------+---------+-----------+----------------+-------------+     Summary: BILATERAL: - No evidence of deep vein thrombosis seen in the lower extremities, bilaterally. -No evidence of popliteal cyst, bilaterally. RIGHT: pulsatile waveforms  LEFT: Pulsatile waveforms.  *See table(s) above for measurements and observations. Electronically signed by Sherald Hess MD on 08/12/2022 at 10:53:30 AM.    Final    CT Angio Chest PE W and/or Wo Contrast  Result Date: 08/12/2022 CLINICAL DATA:  Chest pain. EXAM: CT ANGIOGRAPHY CHEST WITH CONTRAST TECHNIQUE: Multidetector CT imaging of the chest was performed using the standard protocol during bolus administration of intravenous contrast. Multiplanar CT image reconstructions and MIPs were obtained to evaluate the vascular anatomy. RADIATION DOSE REDUCTION: This exam was performed according to the departmental dose-optimization program which includes automated exposure control, adjustment of the mA and/or kV  according to patient size and/or use of iterative reconstruction technique. CONTRAST:  75mL OMNIPAQUE IOHEXOL 350 MG/ML SOLN COMPARISON:  December 18, 2021 FINDINGS: Cardiovascular: Satisfactory opacification of the pulmonary arteries to the segmental level. Marked severity areas of intraluminal low attenuation are seen within the distal aspects of the bilateral pulmonary arteries. There is extension to involve multiple bilateral upper lobe, right middle lobe and bilateral lower lobe branches of the pulmonary arteries. No saddle embolus is seen. Normal heart size. There is right heart strain with an RV/LV ratio of 1.31. No pericardial effusion. Mediastinum/Nodes: No enlarged mediastinal, hilar, or axillary lymph nodes. Thyroid gland, trachea, and esophagus demonstrate no significant findings. Lungs/Pleura: Mild patchy anteromedial right upper lobe and posteromedial left lower lobe infiltrates are seen. There is no evidence of a pleural effusion or pneumothorax. Upper Abdomen: Surgical clips are seen within the gallbladder fossa. Musculoskeletal: No chest wall abnormality. No acute or significant osseous findings. Review of the MIP images confirms the above findings. IMPRESSION: 1. Marked severity bilateral pulmonary embolism with right heart strain (RV/LV Ratio = 1.31) which has been associated with an increased risk of morbidity and mortality. 2. Mild patchy right upper lobe and left lower lobe infiltrates. 3. Evidence of prior cholecystectomy. Electronically Signed   By: Aram Candela M.D.   On: 08/12/2022 00:16   DG Chest 2 View  Result Date: 08/11/2022 CLINICAL DATA:  Shortness of breath and  productive cough. EXAM: CHEST - 2 VIEW COMPARISON:  PA and lateral 05/01/2022 FINDINGS: The heart size and mediastinal contours are within normal limits. Both lungs are clear. The visualized skeletal structures are unremarkable. There are cholecystectomy clips in the upper abdomen. IMPRESSION: No evidence of acute  chest disease or interval changes. Electronically Signed   By: Almira Bar M.D.   On: 08/11/2022 22:39     Discharge Instructions: Discharge Instructions     Call MD for:  difficulty breathing, headache or visual disturbances   Complete by: As directed    Call MD for:  persistant dizziness or light-headedness   Complete by: As directed    Call MD for:  persistant nausea and vomiting   Complete by: As directed    Call MD for:  redness, tenderness, or signs of infection (pain, swelling, redness, odor or green/yellow discharge around incision site)   Complete by: As directed    Call MD for:  severe uncontrolled pain   Complete by: As directed    Call MD for:  temperature >100.4   Complete by: As directed    Discharge instructions   Complete by: As directed    Ms. Santalucia -  You were admitted to the hospital for shortness of breath and chest pain and were found to have blood clots in both of your lung (pulmonary embolism). You were treated with a clot dissolving agent directly into the sites of the clots and systemic blood thinners, which have slowly improved your symptoms. Your vitals are stable and you are now able to go home with the following instructions of care:  MEDICATIONS: 1. You will be on blood thinners for the rest of your life: - Blood thinner = Eliquis, also known as apixaban - Start Eliquis 10 mg total (TWO TABLETS from the same bottle) every 12 hrs starting tonight Thursday 4/25 at night until 08/20/2022  - Then continue taking Eliquis 5 mg total (ONE TABLET) every 12 hours starting on Tuesday 4/30   Please let your providers know before you run out of your medication. You should have 2 additional refills ready for you; once you are ready to access this refills, call your home pharmacy and tell them to request the transfer of medications from [facility] transitions [facility] care pharmacy.   2. You will also be going home with oxygen to help with your shortness of breath. Please  continue to be active to improve your breathing. Start slow and as you are able.  To help your asthma - you are going home with a new inhaler called Dulera - please use 2 puffs of this medication daily. You can also use this medication every 6hrs when you feel short of breath.   3. Because of your loose stools and low potassium while in the hospital, we are sending you a prescription of potassium.  - Take one 20 meq tablet once a day for 3 days.  - Continue to drink plenty of fluids and eat as your able.  We will check your potassium level when you come to the [facility] for your follow-up visit.  4.  You blood work also showed signs of iron deficiency anemia.  You received iron supplementation through your veins.  But you will also need iron supplementation orally once you are home.  We are prescribing you with an iron supplement.   - Take 1 tablet every other day -Your stools may look dark; if you are having problems with constipation resume your MiraLAX.  APPOINTMENTS - [facility] on Monday 08/20/2022 at 2:45 PM with Dr. Burnice Logan.  We will obtain blood tests and monitor your current symptoms. -Please follow up with pulmonology, gastroenterology, and gynecologist when you are discharged. I am adding the numbers where you can reach out to them.  If you experience worsening shortness of breath, chest pain, more than a teaspoon of blood in your sputum, blood in your urine or in your stools, please call a the clinic and plan to be evaluated.  Take care,  Morene Crocker, MD   Increase activity slowly   Complete by: As directed    No dressing needed   Complete by: As directed    Keep your neck area clean with gentle soap and dry thoroughly. No dressing needed       Signed: Morene Crocker, MD [facility] - PGY1 Pager: [phone] 08/16/2022, 2:07 PM

## 2022-08-16 NOTE — TOC Transition Note (Addendum)
Transition of Care [facility]) - CM/SW Discharge Note   Patient Details  Name: Dana Hughes MRN: [id number] Date of Birth: [birth year]  [facility]) CM/SW Contact:  [name], RN Phone Number: 08/16/2022, 12:46 PM   Clinical Narrative:    Patient is for dc today, [name] with [employer] to supply the home oxygen, rollator, and BSC to room prior to dc.  She states her brother or her Kateri Mc will transport her home today.   Ambulatory saturations does not qualify patient for home oxygen, per [employer]. They will just be bringing the rollator and the bsc to room.   Final next level of care: Home/Self Care Barriers to Discharge: No Barriers Identified   Patient Goals and CMS Choice CMS Medicare.gov Compare Post Acute Care list provided to:: Patient Choice offered to / list presented to : Patient  Discharge Placement                         Discharge Plan and Services Additional resources added to the After Visit Summary for     Discharge Planning Services: CM Consult Post Acute Care Choice: Durable Medical Equipment          DME Arranged: Walker rolling with seat, Oxygen, Bedside commode DME Agency: [employer] Date DME Agency Contacted: 08/15/22 Time DME Agency Contacted: 1500 Representative spoke with at DME Agency: [name] HH Arranged: NA          Social Determinants of Health (SDOH) Interventions SDOH Screenings   Food Insecurity: Food Insecurity Present (08/12/2022)  Housing: Medium Risk (08/12/2022)  Transportation Needs: No Transportation Needs (08/12/2022)  Utilities: Not At Risk (08/12/2022)  Depression (PHQ2-9): Medium Risk (05/18/2022)  Financial Resource Strain: Medium Risk (11/08/2021)  Tobacco Use: Medium Risk (08/14/2022)     Readmission Risk Interventions    08/12/2020   12:56 PM  Readmission Risk Prevention Plan  Transportation Screening Complete  PCP or Specialist Appt within 3-5 Days Complete  Social Work Consult for Recovery Care  Planning/Counseling Complete  Palliative Care Screening Not Applicable  Medication Review [employer]) Referral to Pharmacy

## 2022-08-16 NOTE — Progress Notes (Signed)
Physical Therapy Treatment Patient Details Name: Dana Hughes MRN: [id number] DOB: 1981/01/17 Today's Date: 08/16/2022   History of Present Illness Pt is a 42 year old female who presented to ED on 08/11/22 with worsening lightheadedness and chest pain over the last 4 days in the setting of a recent GI illness. Imaging revealed bilat PEs with R heart strain. PMH: submassive PE in 2022, DM II, HTN, bipolar 2 disorder, iron deficiency anemia    PT Comments    Pt tolerated treatment well today. Pt was able to ambulate in hallway independently perform walking O2. Pt presents at or near baseline mobility. Pt has no further acute PT needs and will be signing off. Re consult PT if mobility status changes.    Recommendations for follow up therapy are one component of a multi-disciplinary discharge planning process, led by the attending physician.  Recommendations may be updated based on patient status, additional functional criteria and insurance authorization.  Follow Up Recommendations       Assistance Recommended at Discharge Intermittent Supervision/Assistance  Patient can return home with the following A little help with walking and/or transfers;A little help with bathing/dressing/bathroom;Assist for transportation   Equipment Recommendations  None recommended by PT    Recommendations for Other Services       Precautions / Restrictions Precautions Precautions: Fall;Other (comment) Precaution Comments: SOB, watch SpO2 Restrictions Weight Bearing Restrictions: No     Mobility  Bed Mobility               General bed mobility comments: Pt seated EOB    Transfers Overall transfer level: Independent Equipment used: None Transfers: Sit to/from Stand Sit to Stand: Independent                Ambulation/Gait Ambulation/Gait assistance: Independent Gait Distance (Feet): 150 Feet Assistive device: None Gait Pattern/deviations: Step-through pattern, Decreased stride  length, Wide base of support Gait velocity: dec     General Gait Details: no LOB noted. Few standing rest breaks.   Stairs             Wheelchair Mobility    Modified Rankin (Stroke Patients Only)       Balance Overall balance assessment: No apparent balance deficits (not formally assessed)                                          Cognition Arousal/Alertness: Awake/alert Behavior During Therapy: WFL for tasks assessed/performed Overall Cognitive Status: Within Functional Limits for tasks assessed                                          Exercises      General Comments General comments (skin integrity, edema, etc.): SpO2 97% on 4L at rest. 94% on 4L during ambulation. 98% on 4L after ambulation      Pertinent Vitals/Pain Pain Assessment Pain Assessment: No/denies pain    Home Living                          Prior Function            PT Goals (current goals can now be found in the care plan section) Progress towards PT goals: Progressing toward goals    Frequency    Min 1X/week  PT Plan Current plan remains appropriate    Co-evaluation              AM-PAC PT "6 Clicks" Mobility   Outcome Measure  Help needed turning from your back to your side while in a flat bed without using bedrails?: None Help needed moving from lying on your back to sitting on the side of a flat bed without using bedrails?: None Help needed moving to and from a bed to a chair (including a wheelchair)?: None Help needed standing up from a chair using your arms (e.g., wheelchair or bedside chair)?: None Help needed to walk in hospital room?: None Help needed climbing 3-5 steps with a railing? : A Little 6 Click Score: 23    End of Session Equipment Utilized During Treatment: Oxygen;Gait belt Activity Tolerance: Patient tolerated treatment well Patient left: with call bell/phone within reach;in bed;with bed alarm  set Nurse Communication: Other (comment) (sats) PT Visit Diagnosis: Difficulty in walking, not elsewhere classified (R26.2);Unsteadiness on feet (R26.81)     Time: 1610-9604 PT Time Calculation (min) (ACUTE ONLY): 8 min  Charges:  $Gait Training: 8-22 mins                     Shela Nevin, PT, DPT Acute Rehab Services [phone]    Gladys Damme 08/16/2022, 4:23 PM

## 2022-08-16 NOTE — Progress Notes (Signed)
SATURATION QUALIFICATIONS: (This note is used to comply with regulatory documentation for home oxygen)  Patient Saturations on Room Air at Rest = 94%  Patient Saturations on Room Air while Ambulating = 91%    Patient does not meet criteria for oxygen at home.

## 2022-08-17 ENCOUNTER — Telehealth: Payer: Self-pay

## 2022-08-17 MED ORDER — LANTUS SOLOSTAR 100 UNIT/ML ~~LOC~~ SOPN
20.0000 [IU] | PEN_INJECTOR | Freq: Every day | SUBCUTANEOUS | 0 refills | Status: DC
  Filled 2022-08-17 – 2023-07-07 (×3): qty 6, 30d supply, fill #0

## 2022-08-17 NOTE — Transitions of Care (Post Inpatient/ED Visit) (Signed)
   08/17/2022  Name: Dana Hughes MRN: [id number] DOB: 04-17-1981  Today's TOC FU Call Status: Today's TOC FU Call Status:: Successful TOC FU Call Competed TOC FU Call Complete Date: 08/17/22  Transition Care Management Follow-up Telephone Call Date of Discharge: 08/16/22 Discharge Facility: [facility]) Type of Discharge: Inpatient Admission Primary Inpatient Discharge Diagnosis:: PE How have you been since you were released from the hospital?: Same Any questions or concerns?: No  Items Reviewed: Did you receive and understand the discharge instructions provided?: Yes Medications obtained and verified?: Yes (Medications Reviewed) Any new allergies since your discharge?: No Dietary orders reviewed?: NA Do you have support at home?: Yes People in Home: significant other, child(ren), adult  Home Care and Equipment/Supplies: Were Home Health Services Ordered?: Yes Name of Home Health Agency:: unknown Has Agency set up a time to come to your home?: No Any new equipment or medical supplies ordered?: Yes Name of Medical supply agency?: [facility] Were you able to get the equipment/medical supplies?: No Do you have any questions related to the use of the equipment/supplies?: No  Functional Questionnaire: Do you need assistance with bathing/showering or dressing?: No Do you need assistance with meal preparation?: No Do you need assistance with eating?: No Do you have difficulty maintaining continence: No Do you need assistance with getting out of bed/getting out of a chair/moving?: No Do you have difficulty managing or taking your medications?: No  Follow up appointments reviewed: PCP Follow-up appointment confirmed?: Yes Date of PCP follow-up appointment?: 08/20/22 Follow-up Provider: [facility] Follow-up appointment confirmed?: NA Do you need transportation to your follow-up appointment?: No Do you understand care options if your condition(s) worsen?:  Yes-patient verbalized understanding    SIGNATURE Karena Addison, LPN [facility] Nurse Health Advisor Direct Dial [phone]

## 2022-08-18 ENCOUNTER — Other Ambulatory Visit: Payer: Self-pay

## 2022-08-20 ENCOUNTER — Other Ambulatory Visit: Payer: Self-pay

## 2022-08-20 ENCOUNTER — Ambulatory Visit (INDEPENDENT_AMBULATORY_CARE_PROVIDER_SITE_OTHER): Payer: Medicaid Other | Admitting: Internal Medicine

## 2022-08-20 ENCOUNTER — Encounter: Payer: Self-pay | Admitting: Internal Medicine

## 2022-08-20 VITALS — BP 114/89 | HR 99 | Temp 98.1°F | Resp 28 | Ht 63.0 in | Wt 201.6 lb

## 2022-08-20 DIAGNOSIS — I2694 Multiple subsegmental pulmonary emboli without acute cor pulmonale: Secondary | ICD-10-CM | POA: Diagnosis not present

## 2022-08-20 DIAGNOSIS — K921 Melena: Secondary | ICD-10-CM

## 2022-08-20 DIAGNOSIS — I10 Essential (primary) hypertension: Secondary | ICD-10-CM | POA: Diagnosis present

## 2022-08-20 NOTE — Progress Notes (Unsigned)
CC: HFU  HPI:[name] is a 42 y.o. female who presents for evaluation of PE. Please see individual problem based A/P for details.  41 yof hx of PE on AC and GIB She has continued to feel very symptomatic following her PE. She has been compliant with her Eliquis and plans on following up with her pulmonologist. She does note that she has had recurrence of her GI bleeding since restarting the Eliquis, but this sounds to be abating. She tells me she will call GI office to reschedule follow up.  Depression, PHQ-9: Based on the patients  Flowsheet Row Office Visit from 08/20/2022 in [facility]  PHQ-9 Total Score 17      score we have .  Past Medical History:  Diagnosis Date   Anxiety    Arthritis    Asthma    Bronchitis    BV (bacterial vaginosis)    Candidiasis    Depression    Diabetes mellitus without complication (HCC)    Endometriosis    Headache(784.0)    migraines   Hypertension    no longer takes meds   Migraines    Muscle spasm    Ovarian cyst    Pelvic pain in female 09/29/2015   Scoliosis    Uterine fibroid    Review of Systems:   See hpi  Physical Exam: Vitals:   08/20/22 1432  BP: 114/89  Pulse: 99  Resp: (!) 28  Temp: 98.1 F (36.7 C)  TempSrc: Oral  SpO2: 100%  Weight: 201 lb 9.6 oz (91.4 kg)  Height: 5\' 3"  (1.6 m)   General: nad, appears fatigued HEENT: Conjunctiva nl , antiicteric sclerae, moist mucous membranes, no exudate or erythema Cardiovascular:  regular rhythm.  No murmurs, rubs, or gallops Pulmonary : Equal breath sounds, No wheezes, rales, or rhonchi, tachypneic Abdominal: soft, nontender,  bowel sounds present Ext: No edema in lower extremities, no tenderness to palpation of lower extremities.   Assessment & Plan:   See Encounters Tab for problem based charting.  Pulmonary embolism (HCC) Recent hospitalization for PE. Required TPA. ECHO at that time showed some RHS. Has been anticoagulated  with Eliquis since discharge.  Still symptomatic with DOE. She feels as though she needs O2. Requesting oxygen tank and home monitor. Feels as though she also needs [facility] RN. Has been using rollator.  On exam, her lungs are clear without crackles. No evidence of volume overload. She is tachypneic but satting 100%. Counseled patient that she will likely continue to have symptoms given severity of PE. Additional oxygen will not improve this since she is already at 100%. She can purchase OTC pulse ox to keep an eye on her sats when she does become symptomatic to ensure she is not dropping. She will need to decrease work. Continue taking eliquis as prescribed and continue working on recovery. She needs to follow up with pulm. At some point in the future, her ECHo will need to be repeated to ensure resolution of the RHS that was observed in acute setting. If ECHO does not show improvement, may need to involve cardiology  Hematochezia Patient reports bloody stools since resuming Eliquis. Reports this is abating.   Prior scope showed some hemorrhoids. Benefits from continuing Eliquis outweigh risks of hemorrhoidal bleeding. She seems to be HDS so I think she should continue this. If bleeding becomes significant she will need to contact PCP. Patient needs to follow up with GI.   Patient discussed with Dr.  Machen

## 2022-08-20 NOTE — Patient Instructions (Signed)
Dear Dana Hughes,  Thank you for trusting [place] with your care.   We discussed your recent blood clot. I recommend you purchase a pulse oximeter over the counter. They are usually between 10-20$. Use this to watch your oxygen levels. We will provide you with an oxygen tank as necessary. Unfortunately, after a significant clot such as yours, patient's remain very symptomatic, but this is not an issue fixed with oxygen.  It is very important you continue to take your eliquis. I will send in a refill. Please let us know early if you have trouble refilling this.  Please make sure you are seen by the GI doctor and by the lung doctor.  We will see you back in 1 month.

## 2022-08-21 LAB — CBC WITH DIFFERENTIAL/PLATELET
Basophils Absolute: 0 10*3/uL (ref 0.0–0.2)
Basos: 0 %
EOS (ABSOLUTE): 0.1 10*3/uL (ref 0.0–0.4)
Eos: 1 %
Hematocrit: 36.6 % (ref 34.0–46.6)
Hemoglobin: 10.8 g/dL — ABNORMAL LOW (ref 11.1–15.9)
Immature Grans (Abs): 0.1 10*3/uL (ref 0.0–0.1)
Immature Granulocytes: 1 %
Lymphocytes Absolute: 2.3 10*3/uL (ref 0.7–3.1)
Lymphs: 26 %
MCH: 23.1 pg — ABNORMAL LOW (ref 26.6–33.0)
MCHC: 29.5 g/dL — ABNORMAL LOW (ref 31.5–35.7)
MCV: 78 fL — ABNORMAL LOW (ref 79–97)
Monocytes Absolute: 0.6 10*3/uL (ref 0.1–0.9)
Monocytes: 7 %
Neutrophils Absolute: 5.9 10*3/uL (ref 1.4–7.0)
Neutrophils: 65 %
Platelets: 521 10*3/uL — ABNORMAL HIGH (ref 150–450)
RBC: 4.67 x10E6/uL (ref 3.77–5.28)
RDW: 21.1 % — ABNORMAL HIGH (ref 11.7–15.4)
WBC: 9 10*3/uL (ref 3.4–10.8)

## 2022-08-21 LAB — BMP8+ANION GAP
Anion Gap: 18 mmol/L (ref 10.0–18.0)
BUN/Creatinine Ratio: 9 (ref 9–23)
BUN: 7 mg/dL (ref 6–24)
CO2: 18 mmol/L — ABNORMAL LOW (ref 20–29)
Calcium: 10.4 mg/dL — ABNORMAL HIGH (ref 8.7–10.2)
Chloride: 105 mmol/L (ref 96–106)
Creatinine, Ser: 0.74 mg/dL (ref 0.57–1.00)
Glucose: 154 mg/dL — ABNORMAL HIGH (ref 70–99)
Potassium: 4.7 mmol/L (ref 3.5–5.2)
Sodium: 141 mmol/L (ref 134–144)
eGFR: 104 mL/min/{1.73_m2} (ref >59)

## 2022-08-22 ENCOUNTER — Encounter (HOSPITAL_COMMUNITY): Payer: Self-pay

## 2022-08-22 ENCOUNTER — Other Ambulatory Visit (HOSPITAL_COMMUNITY): Payer: Self-pay | Admitting: Pulmonary Disease

## 2022-08-22 ENCOUNTER — Other Ambulatory Visit: Payer: Self-pay | Admitting: Student

## 2022-08-22 ENCOUNTER — Other Ambulatory Visit: Payer: Self-pay

## 2022-08-22 DIAGNOSIS — I2699 Other pulmonary embolism without acute cor pulmonale: Secondary | ICD-10-CM

## 2022-08-22 DIAGNOSIS — Z0000 Encounter for general adult medical examination without abnormal findings: Secondary | ICD-10-CM

## 2022-08-22 HISTORY — PX: IR ANGIOGRAM PULMONARY BILATERAL SELECTIVE: IMG664

## 2022-08-22 HISTORY — PX: IR ANGIOGRAM SELECTIVE EACH ADDITIONAL VESSEL: IMG667

## 2022-08-22 NOTE — Assessment & Plan Note (Signed)
Still symptomatic with DOE. She feels as though she needs O2. Requesting oxygen tank and home monitor. Feels as though she also needs [facility] RN. Has been sing rollator.

## 2022-08-22 NOTE — Assessment & Plan Note (Signed)
>>  ASSESSMENT AND PLAN FOR PULMONARY EMBOLISM (HCC) WRITTEN ON 08/23/2022  4:22 AM BY Adron Bene, MD  Recent hospitalization for PE. Required TPA. ECHO at that time showed some RHS. Has been anticoagulated with Eliquis since discharge.  Still symptomatic with DOE. She feels as though she needs O2. Requesting oxygen tank and home monitor. Feels as though she also needs [facility] RN. Has been using rollator.  On exam, her lungs are clear without crackles. No evidence of volume overload. She is tachypneic but satting 100%. Counseled patient that she will likely continue to have symptoms given severity of PE. Additional oxygen will not improve this since she is already at 100%. She can purchase OTC pulse ox to keep an eye on her sats when she does become symptomatic to ensure she is not dropping. She will need to decrease work. Continue taking eliquis as prescribed and continue working on recovery. She needs to follow up with pulm. At some point in the future, her ECHo will need to be repeated to ensure resolution of the RHS that was observed in acute setting. If ECHO does not show improvement, may need to involve cardiology

## 2022-08-23 ENCOUNTER — Other Ambulatory Visit: Payer: Self-pay

## 2022-08-23 ENCOUNTER — Other Ambulatory Visit: Payer: Self-pay | Admitting: Internal Medicine

## 2022-08-23 ENCOUNTER — Encounter: Payer: Self-pay | Admitting: Internal Medicine

## 2022-08-23 ENCOUNTER — Ambulatory Visit
Admission: RE | Admit: 2022-08-23 | Discharge: 2022-08-23 | Disposition: A | Discharge status: Home / Self Care | Payer: Medicaid Other | Source: Ambulatory Visit | Attending: Obstetrics and Gynecology | Admitting: Obstetrics and Gynecology

## 2022-08-23 ENCOUNTER — Other Ambulatory Visit (HOSPITAL_COMMUNITY): Payer: Self-pay

## 2022-08-23 DIAGNOSIS — Z1231 Encounter for screening mammogram for malignant neoplasm of breast: Secondary | ICD-10-CM

## 2022-08-23 MED ORDER — APIXABAN 5 MG PO TABS
5.0000 mg | ORAL_TABLET | Freq: Two times a day (BID) | ORAL | 1 refills | Status: DC
  Filled 2022-08-23 – 2022-08-27 (×2): qty 60, 30d supply, fill #0
  Filled 2022-10-03: qty 60, 30d supply, fill #1
  Filled 2022-10-03 – 2022-10-15 (×2): qty 60, 30d supply, fill #0

## 2022-08-23 NOTE — Addendum Note (Signed)
Addended by: Adron Bene T on: 08/23/2022 04:30 AM   Modules accepted: Orders

## 2022-08-23 NOTE — Progress Notes (Signed)
[facility] Clinic Attending ° °Case discussed with Dr. Gawaluck  At the time of the visit.  We reviewed the resident’s history and exam and pertinent patient test results.  I agree with the assessment, diagnosis, and plan of care documented in the resident’s note.  °

## 2022-08-23 NOTE — Progress Notes (Signed)
Platelets elevated. Possibly reactive, but has had thrombocytosis in the past. Recent normalization may represent consumption in acute clot setting. She is on Eliquis, do not think ASA is necessary at this time.    Also noted to have elevated calcium on labwork. We will have her return for repeat labs. If it remains elevated, will need to work this up further.

## 2022-08-23 NOTE — Assessment & Plan Note (Signed)
Patient reports bloody stools since resuming Eliquis. Reports this is abating.   Prior scope showed some hemorrhoids. Benefits from continuing Eliquis outweigh risks of hemorrhoidal bleeding. She seems to be HDS so I think she should continue this. If bleeding becomes significant she will need to contact PCP. Patient needs to follow up with GI.

## 2022-08-24 ENCOUNTER — Other Ambulatory Visit: Payer: Self-pay

## 2022-08-24 ENCOUNTER — Other Ambulatory Visit (HOSPITAL_COMMUNITY): Payer: Self-pay

## 2022-08-27 ENCOUNTER — Other Ambulatory Visit: Payer: Self-pay

## 2022-08-27 ENCOUNTER — Other Ambulatory Visit: Payer: Self-pay | Admitting: Student

## 2022-08-27 ENCOUNTER — Telehealth (HOSPITAL_COMMUNITY): Payer: Self-pay | Admitting: Psychiatry

## 2022-08-27 ENCOUNTER — Encounter: Payer: Medicaid Other | Admitting: Plastic Surgery

## 2022-08-27 NOTE — Telephone Encounter (Signed)
Provider informed patient that disability paperwork was not received.  Provider gave the patient the office email address to have forms sent.  She endorsed understanding and agreed.  No other concerns noted at this time.

## 2022-08-27 NOTE — Telephone Encounter (Signed)
Pt requesting a call back if possible.   apixaban (ELIQUIS) 5 MG TABS tablet  [facility] [place]

## 2022-08-28 ENCOUNTER — Other Ambulatory Visit: Payer: Self-pay

## 2022-09-03 ENCOUNTER — Encounter: Payer: Medicaid Other | Admitting: Gastroenterology

## 2022-09-06 ENCOUNTER — Ambulatory Visit (INDEPENDENT_AMBULATORY_CARE_PROVIDER_SITE_OTHER): Payer: Medicaid Other | Admitting: Student

## 2022-09-06 ENCOUNTER — Telehealth: Payer: Self-pay | Admitting: *Deleted

## 2022-09-06 DIAGNOSIS — R079 Chest pain, unspecified: Secondary | ICD-10-CM | POA: Insufficient documentation

## 2022-09-06 NOTE — Telephone Encounter (Signed)
Pt had called the front office about a sleep study which was transferred to [place]. [place] told pt sleep study can be done at her Pulmonary appt on 5/30. Then she mentioned to [place] about chest pain and not having oxygen; call transferred to Triage. Pt was discharged from the hospital 4/25; HFU appt was 4/29 and she saw Dr Burnice Logan. Her O2 sats in the hospital and f/u appt was ok; no oxygen required. She's aware of this but stated when she gets home she has SOB getting out of bed, talking long pds of time, and walking with her walker; "I have trouble catching my breath" She stated last night she had "some chest discomfort"; denies any pain at this time. She does not have a pulse ox to check her sats. She stated she is taking her blood thinner med,Eliquis. She was offered an in-person appt today but she stated she does not have the money today for transportation and she's babysitting. There are no open appts tomorrow.  She's agreeable to telehealth appt  - Telehealth appt schedule today @ 1415PM w/Dr Sherrilee Gilles.

## 2022-09-06 NOTE — Assessment & Plan Note (Signed)
Chest pain started last night that is sharp and intermittent that lasts 10-15 minutes, resolves with rest but then returns. Pain most intense at sternum and radiates to left chest and shoulder. Still endorses chest pain today but "mild". Associated dyspnea. Ongoing DOE but she states feels "winded with just speaking to you". States does not have pulse ox at home to check. She denies any tenderness of chest wall over pain site. No recent injury or trauma. Recently discharged from hospital in April for PE on Eliquis. States adherence to anticoagulation. She is currently on her menstrual cycle. No bleeding elsewhere at this time.   Advised patient due to new chest pain and what appears worsening shortness of breath to be evaluated in the ED. Patient verbalizes understanding and will go to emergency department today either by POV or EMS.

## 2022-09-06 NOTE — Progress Notes (Signed)
   CC: chest pain  This is a telephone encounter between Dana Hughes and Rana Snare on 09/06/2022 for chest pain. The visit was conducted with the patient located at home and Rana Snare at [facility]. The patient's identity was confirmed using their DOB and current address. The patient has consented to being evaluated through a telephone encounter and understands the associated risks (an examination cannot be done and the patient may need to come in for an appointment) / benefits (allows the patient to remain at home, decreasing exposure to coronavirus). I personally spent 13 minutes on medical discussion.   HPI:  Dana Hughes is a 42 y.o. with PMH as below.   Please see A&P for assessment of the patient's acute and chronic medical conditions.   Past Medical History:  Diagnosis Date   Anxiety    Arthritis    Asthma    Bronchitis    BV (bacterial vaginosis)    Candidiasis    Depression    Diabetes mellitus without complication (HCC)    Endometriosis    Headache(784.0)    migraines   Hypertension    no longer takes meds   Migraines    Muscle spasm    Ovarian cyst    Pelvic pain in female 09/29/2015   Scoliosis    Uterine fibroid    Review of Systems:  positive chest pain and shortness of breath, negative bleeding   Assessment & Plan:   Chest pain Chest pain started last night that is sharp and intermittent that lasts 10-15 minutes, resolves with rest but then returns. Pain most intense at sternum and radiates to left chest and shoulder. Still endorses chest pain today but "mild". Associated dyspnea. Ongoing DOE but she states feels "winded with just speaking to you". States does not have pulse ox at home to check. She denies any tenderness of chest wall over pain site. No recent injury or trauma. Recently discharged from hospital in April for PE on Eliquis. States adherence to anticoagulation. She is currently on her menstrual cycle. No bleeding elsewhere at this time.    Advised patient due to new chest pain and what appears worsening shortness of breath to be evaluated in the ED. Patient verbalizes understanding and will go to emergency department today either by POV or EMS.    Patient discussed with Dr. Marin Roberts, DO Internal Medicine Resident 09/06/22

## 2022-09-10 ENCOUNTER — Other Ambulatory Visit (HOSPITAL_COMMUNITY): Payer: Self-pay

## 2022-09-10 ENCOUNTER — Ambulatory Visit (INDEPENDENT_AMBULATORY_CARE_PROVIDER_SITE_OTHER): Payer: Medicaid Other | Admitting: Student

## 2022-09-10 ENCOUNTER — Encounter: Payer: Self-pay | Admitting: Student

## 2022-09-10 VITALS — BP 125/87 | HR 66 | Temp 97.7°F | Wt 209.2 lb

## 2022-09-10 DIAGNOSIS — D259 Leiomyoma of uterus, unspecified: Secondary | ICD-10-CM | POA: Diagnosis not present

## 2022-09-10 DIAGNOSIS — D219 Benign neoplasm of connective and other soft tissue, unspecified: Secondary | ICD-10-CM

## 2022-09-10 DIAGNOSIS — Z86711 Personal history of pulmonary embolism: Secondary | ICD-10-CM

## 2022-09-10 DIAGNOSIS — Z794 Long term (current) use of insulin: Secondary | ICD-10-CM

## 2022-09-10 DIAGNOSIS — Z7984 Long term (current) use of oral hypoglycemic drugs: Secondary | ICD-10-CM | POA: Diagnosis not present

## 2022-09-10 DIAGNOSIS — R071 Chest pain on breathing: Secondary | ICD-10-CM | POA: Diagnosis present

## 2022-09-10 DIAGNOSIS — E119 Type 2 diabetes mellitus without complications: Secondary | ICD-10-CM | POA: Diagnosis not present

## 2022-09-10 DIAGNOSIS — J45909 Unspecified asthma, uncomplicated: Secondary | ICD-10-CM | POA: Insufficient documentation

## 2022-09-10 LAB — POCT GLYCOSYLATED HEMOGLOBIN (HGB A1C): Hemoglobin A1C: 8.9 % — AB (ref 4.0–5.6)

## 2022-09-10 LAB — GLUCOSE, CAPILLARY: Glucose-Capillary: 238 mg/dL — ABNORMAL HIGH (ref 70–99)

## 2022-09-10 MED ORDER — TRULICITY 1.5 MG/0.5ML ~~LOC~~ SOAJ
1.5000 mg | SUBCUTANEOUS | 0 refills | Status: DC
Start: 2022-09-10 — End: 2022-11-06
  Filled 2022-09-10: qty 6, 84d supply, fill #0

## 2022-09-10 NOTE — Assessment & Plan Note (Signed)
History of menorrhagia, fibroids, and endometriosis complicated by microcytic anemia. Patient with PO iron therapy, which she is adherent to since hospital discharge. Referral to GYN for follow up today

## 2022-09-10 NOTE — Assessment & Plan Note (Signed)
Patient presentin

## 2022-09-10 NOTE — Progress Notes (Signed)
Subjective:  CC: Chest pain and diabetes follow up  HPI:  [name] is a 42 y.o. female with a past medical history stated below and presents today for chest pain and diabetes follow up. Please see problem based assessment and plan for additional details.  Past Medical History:  Diagnosis Date   Anxiety    Arthritis    Asthma    Bronchitis    BV (bacterial vaginosis)    Candidiasis    Depression    Diabetes mellitus without complication (HCC)    Endometriosis    Headache(784.0)    migraines   Hypertension    no longer takes meds   Migraines    Muscle spasm    Ovarian cyst    Pelvic pain in female 09/29/2015   Scoliosis    Uterine fibroid     Current Outpatient Medications on File Prior to Visit  Medication Sig Dispense Refill   acetaminophen (TYLENOL) 500 MG tablet Take 2 tablets (1,000 mg total) by mouth every 6 (six) hours as needed for mild pain (or Fever >/= 101). 30 tablet 0   albuterol (PROVENTIL) (2.5 MG/3ML) 0.083% nebulizer solution Take 3 mLs (2.5 mg total) by nebulization every 6 (six) hours as needed for wheezing or shortness of breath. 75 mL 0   albuterol (VENTOLIN HFA) 108 (90 Base) MCG/ACT inhaler Inhale 2 puffs into the lungs every 6 (six) hours as needed for wheezing or shortness of breath. Reported on 09/29/2015     AMBULATORY NON FORMULARY MEDICATION Nitroglycerin 0.125% gel - apply a pea size amount to your rectum three times daily x 6-8 weeks. (Patient not taking: Reported on 08/12/2022) 45 g 1   amLODipine (NORVASC) 5 MG tablet Take 1 tablet (5 mg total) by mouth daily. 30 tablet 11   apixaban (ELIQUIS) 5 MG TABS tablet Take 2 tablets (10 mg total) by mouth 2 (two) times daily for 9 doses then 1 tablet twice daily thereafter 78 tablet 0   apixaban (ELIQUIS) 5 MG TABS tablet Take 1 tablet (5 mg total) by mouth 2 (two) times daily. 60 tablet 1   ARIPiprazole (ABILIFY) 20 MG tablet Take 1 tablet (20 mg total) by mouth daily. 30 tablet 3    artificial tears (LACRILUBE) OINT ophthalmic ointment Place into both eyes at bedtime as needed for dry eyes. 3.5 g 0   bisacodyl (DULCOLAX) 5 MG EC tablet Take 1 tablet (5 mg total) by mouth daily as needed for moderate constipation. 15 tablet 0   diclofenac Sodium (VOLTAREN) 1 % GEL Apply 4 g topically 4 (four) times daily. 100 g 0   dicyclomine (BENTYL) 10 MG capsule Take 1 capsule (10 mg total) by mouth 4 (four) times daily as needed for spasms (abdominal pain). 30 capsule 1   fluticasone (FLONASE) 50 MCG/ACT nasal spray PLACE 1 SPRAY INTO BOTH NOSTRILS IN THE MORNING AND AT BEDTIME. (Patient taking differently: 2 sprays.) 16 g 1   gabapentin (NEURONTIN) 300 MG capsule Take 1 capsule (300 mg total) by mouth 3 (three) times daily. 90 capsule 3   glucose blood test strip Check your sugar in the morning before you eat breakfast, and one hour after a meal. 100 each 2   insulin aspart (NOVOLOG) 100 UNIT/ML FlexPen Inject 10 Units into the skin 2 (two) times daily with a meal. 6 mL 0   insulin glargine (LANTUS SOLOSTAR) 100 UNIT/ML Solostar Pen Inject 20 Units into the skin daily at 10pm. 6 mL 0  Insulin Pen Needle 32G X 4 MM MISC Use as directed 4 times daily. 200 each 0   Iron Polysacch Cmplx-B12-FA 150-0.025-1 MG CAPS Take 1 capsule by mouth every other day. 15 capsule 2   lamoTRIgine (LAMICTAL) 150 MG tablet Take 1 tablet (150 mg total) by mouth daily. 30 tablet 3   meclizine (ANTIVERT) 25 MG tablet Take 1 tablet (25 mg total) by mouth 3 (three) times daily as needed for dizziness. 30 tablet 0   metFORMIN (GLUCOPHAGE) 1000 MG tablet Take 1 tablet (1,000 mg total) by mouth 2 (two) times daily with a meal. 90 tablet 3   mirtazapine (REMERON) 45 MG tablet Take 1 tablet (45 mg total) by mouth at bedtime. 30 tablet 3   mometasone-formoterol (DULERA) 200-5 MCG/ACT AERO Inhale 2 puffs into the lungs 2 (two) times daily. 13 g 2   omeprazole (PRILOSEC) 40 MG capsule Take 1 capsule (40 mg total) by mouth  daily. 30 capsule 1   polyethylene glycol powder (GLYCOLAX/MIRALAX) 17 GM/SCOOP powder Take 127.5 g by mouth daily. 119 g 0   potassium chloride SA (KLOR-CON M) 20 MEQ tablet Take 1 tablet (20 mEq total) by mouth daily for 3 days. 3 tablet 0   senna-docusate (SENOKOT S) 8.6-50 MG tablet Take 1 tablet by mouth daily. 90 tablet 2   SUMAtriptan (IMITREX) 50 MG tablet Take 1 tablet (50 mg total) by mouth every 2 (two) hours as needed for migraine. May repeat in 2 hours if headache persists or recurs. Please take no more than 3 tablets (150 mg) in 24 hours. 15 tablet 0   Tasimelteon (HETLIOZ) 20 MG CAPS Take 20 mg by mouth at bedtime. 30 capsule 3   TRUEplus Lancets 28G MISC USE AS DIRECTED 100 each 0   No current facility-administered medications on file prior to visit.    Family History  Problem Relation Age of Onset   Migraines Mother    Cancer Father    Colon cancer Father    Lupus Paternal Grandmother    Cancer Paternal Grandmother    Breast cancer Paternal Grandmother    Migraines Maternal Aunt    Hypertension Maternal Aunt    Breast cancer Maternal Aunt    Colon cancer Paternal Aunt    Breast cancer Paternal Aunt    Ovarian cancer Cousin     Social History   Socioeconomic History   Marital status: Single    Spouse name: Not on file   Number of children: Not on file   Years of education: Not on file   Highest education level: Not on file  Occupational History   Not on file  Tobacco Use   Smoking status: Former    Types: Cigars   Smokeless tobacco: Never   Tobacco comments:    1-2 cigars/week  Vaping Use   Vaping Use: Never used  Substance and Sexual Activity   Alcohol use: Not Currently    Comment: occasional   Drug use: Yes    Frequency: 7.0 times per week    Types: Marijuana    Comment: 1  blunts a week, last use last night   Sexual activity: Yes    Birth control/protection: Condom, Surgical  Other Topics Concern   Not on file  Social History Narrative    Not on file   Social Determinants of Health   Financial Resource Strain: Medium Risk (11/08/2021)   Overall Financial Resource Strain (CARDIA)    Difficulty of Paying Living Expenses: Somewhat hard  Food  Insecurity: Food Insecurity Present (08/12/2022)   Hunger Vital Sign    Worried About Running Out of Food in the Last Year: Sometimes true    Ran Out of Food in the Last Year: Never true  Transportation Needs: No Transportation Needs (08/12/2022)   PRAPARE - Administrator, Civil Service (Medical): No    Lack of Transportation (Non-Medical): No  Physical Activity: Not on file  Stress: Not on file  Social Connections: Moderately Isolated (08/20/2022)   Social Connection and Isolation Panel [NHANES]    Frequency of Communication with Friends and Family: More than three times a week    Frequency of Social Gatherings with Friends and Family: More than three times a week    Attends Religious Services: 1 to 4 times per year    Active Member of [employer] or Organizations: No    Attends Banker Meetings: Never    Marital Status: Never married  Intimate Partner Violence: Not At Risk (08/12/2022)   Humiliation, Afraid, Rape, and Kick questionnaire    Fear of Current or Ex-Partner: No    Emotionally Abused: No    Physically Abused: No    Sexually Abused: No    Review of Systems: ROS negative except for what is noted on the assessment and plan.  Objective:   Vitals:   09/10/22 1039  BP: 125/87  Pulse: 66  Temp: 97.7 F (36.5 C)  TempSrc: Oral  SpO2: 100%  Weight: 209 lb 3.2 oz (94.9 kg)    Physical Exam: Constitutional: well-appearing woman sitting in chair, in no acute distress HENT: normocephalic atraumatic, mucous membranes moist Eyes: conjunctiva non-erythematous Neck: supple Cardiovascular: regular rate and rhythm, no m/r/g Pulmonary/Chest: normal work of breathing on room air, lungs clear to auscultation bilaterally Abdominal: soft, non-tender,  non-distended MSK: normal bulk and tone Neurological: alert & oriented x 3, normal gait with and without Rolator walker Skin: warm and dry Psych: Pleasant mood and affect       Assessment & Plan:   History of pulmonary embolism Recent hospitalization in 07/2022 for bilateral PE with RHS seen on EKG and ECHO that required catheter directed thrombolysis for severe symptoms. Tachycardia and oxygen supplementation needs resolved after treament. Patient continued to endorse pleuritic chest pain after discharge.  Adherent to Eliquis 5 mg BID dosing. Denies GI bleeding or signs of mucosal or skin ecchymoses. No falls. Patient using Rolator less and walking longer distances. Continues to endorse mild pleuritic chest pain.  Using rescue inhaler about once per day, which is improved from >3 per day since hospital discharge. Pleuritic chest pain improved. SPO2 on RA today 100%. Patient continues to improve since hospital discharge. Will need pulmonology follow up; there is question of chronic PE and pulmonary hypertension per in-hospital pulmonology consult. -Continue Eliquis therapy indefinitely -Follow up in Pulmonology clinic on 5/30  Pulmonary embolism [facility]) Patient presentin  Chest pain Patient presenting after Telehealth visit for chest pain during which she was instructed to follow up  in the ED, however, patient was not able to go.  Today she reports that the chest pain similar to the one she experienced at the hospital, which has improved. It is pleuritic and positional in nature and worsens when she pushes herself to walk longer distances. No pain at rest. No associated nausea, vomiting, changes in vision or worsened DOE. She has been able to walk for longer distances since being discharged from the hospital and this chest discomfort has improved.   She denies  current symptoms. Patient was asked to walk along hallway and back to room, after which she was also asymptomatic. VS wnl and SPO2 100  %. Mild pleuritic back pain, otherwise unremarkable exam.   Highest on my differential is pain associated with known bilateral pulmonary nodules. No symptoms currently, thus did not purse EKG  or ACS work up. Advised patient to seek medical attention should she experienced new symptoms to be evaluated in person. Other precautionary return visits were given.  Fibroids History of menorrhagia, fibroids, and endometriosis complicated by microcytic anemia. Patient with PO iron therapy, which she is adherent to since hospital discharge. Referral to GYN for follow up today  Diabetes mellitus, type 2 (HCC) Last A1c 12.6 on 2/20. Patient was started on Trulicity 0.75 mg weekly in additon to glargine 20u daily, Novolog 10u twice per day with meals, Metformin 1000mg  twice daily. Patient was not taking Trulicity prior to hospital admission. Started Trulicity after discharge from hospital. Patient has been on it for only two weeks since. Denies major side effects other than intermittent nausea that is not limiting to finishing 3 meals per day. No vomiting, abdominal bloating. No hypoglycemic episodes  Repeat A1c today 8.9 - Continue 0.75 mg weekly Trulicity for next 2 doses - Will send prescription for the 1.5 mg weekly dosing there after - Continue rest of DM regimen - Return in 3 months - will consider reassessing insulin requirements if A1c continued to improve    Return in about 3 months (around 12/11/2022) for diabetes, PE, GIB follow up.  Patient discussed with Dr. Lupita Leash, MD [facility] Internal Medicine Program - PGY-1 09/10/2022, 12:16 PM

## 2022-09-10 NOTE — Assessment & Plan Note (Addendum)
Last A1c 12.6 on 2/20. Patient was started on Trulicity 0.75 mg weekly in additon to glargine 20u daily, Novolog 10u twice per day with meals, Metformin 1000mg  twice daily. Patient was not taking Trulicity prior to hospital admission. Started Trulicity after discharge from hospital. Patient has been on it for only two weeks since. Denies major side effects other than intermittent nausea that is not limiting to finishing 3 meals per day. No vomiting, abdominal bloating. No hypoglycemic episodes  Repeat A1c today 8.9 - Continue 0.75 mg weekly Trulicity for next 2 doses - Will send prescription for the 1.5 mg weekly dosing there after - Continue rest of DM regimen - Return in 3 months - will consider reassessing insulin requirements if A1c continued to improve

## 2022-09-10 NOTE — Patient Instructions (Addendum)
Thank you, Ms.Andria Meuse for allowing [place] to provide your care today. Today we discussed your recent telehealth visit for chest pain, pulmonary embolism, asthma, and diabetes. We also spoke about referrals mentioned to you during your recent hospitalization.    As I mentioned during outr visit, this intermittent pain with deep inspiration you are experiencing is likely because of the blood clots in your lungs. I am encouraged to know that the shortness of breath and the ability to walk longer distances have significantly improved. If you experience chest pain that do not subside after a short period of rest, I recommend you head to the nearest ED to get a EKG of your heart. We would like you to be evaluated.  In the meantime, continue taking your Eliquis, your inhalers for asthma, and keep your appointment with the pulmonologist. Remember to ask them about the sleep study.  Referrals: - Referral to GYN for fibroid  and heavy menstrual periods  New medications: - after you are done with your 2 doses of Trulicity 0.75 mg weekly, transition to taking Trulicity 1.5 mg weekly. This will be followed up at your 3 month   I have ordered the following labs for you:   Lab Orders         POC Hbg A1C      I will call if any are abnormal. All of your labs can be accessed through "My Chart".   My Chart Access: [email or web address]  Please follow-up in:   We look forward to seeing you next time. Please call our clinic at [phone] if you have any questions or concerns. The best time to call is Monday-Friday from 9am-4pm, but there is someone available 24/7. If after hours or the weekend, call the main hospital number and ask for the Internal Medicine Resident On-Call. If you need medication refills, please notify your pharmacy one week in advance and they will send [place] a request.   Thank you for letting us take part in your care. Wishing you the  best!  Morene Crocker, MD 09/10/2022, 11:13 AM [facility] Internal Medicine Resident, PGY-1

## 2022-09-10 NOTE — Assessment & Plan Note (Signed)
Patient presenting after Telehealth visit for chest pain during which she was instructed to follow up  in the ED, however, patient was not able to go.  Today she reports that the chest pain similar to the one she experienced at the hospital, which has improved. It is pleuritic and positional in nature and worsens when she pushes herself to walk longer distances. No pain at rest. No associated nausea, vomiting, changes in vision or worsened DOE. She has been able to walk for longer distances since being discharged from the hospital and this chest discomfort has improved.   She denies current symptoms. Patient was asked to walk along hallway and back to room, after which she was also asymptomatic. VS wnl and SPO2 100 %. Mild pleuritic back pain, otherwise unremarkable exam.   Highest on my differential is pain associated with known bilateral pulmonary nodules. No symptoms currently, thus did not purse EKG  or ACS work up. Advised patient to seek medical attention should she experienced new symptoms to be evaluated in person. Other precautionary return visits were given.

## 2022-09-10 NOTE — Assessment & Plan Note (Addendum)
Recent hospitalization in 07/2022 for bilateral PE with RHS seen on EKG and ECHO that required catheter directed thrombolysis for severe symptoms. Tachycardia and oxygen supplementation needs resolved after treament. Patient continued to endorse pleuritic chest pain after discharge.  Adherent to Eliquis 5 mg BID dosing. Denies GI bleeding or signs of mucosal or skin ecchymoses. No falls. Patient using Rolator less and walking longer distances. Continues to endorse mild pleuritic chest pain.  Using rescue inhaler about once per day, which is improved from >3 per day since hospital discharge. Pleuritic chest pain improved. SPO2 on RA today 100%. Patient continues to improve since hospital discharge. Will need pulmonology follow up; there is question of chronic PE and pulmonary hypertension per in-hospital pulmonology consult. -Continue Eliquis therapy indefinitely -Follow up in Pulmonology clinic on 5/30

## 2022-09-11 ENCOUNTER — Institutional Professional Consult (permissible substitution): Payer: Self-pay | Admitting: Plastic Surgery

## 2022-09-11 NOTE — Progress Notes (Signed)
Internal Medicine Clinic Attending  Case discussed with Dr. Zheng  At the time of the visit.  We reviewed the resident's history and exam and pertinent patient test results.  I agree with the assessment, diagnosis, and plan of care documented in the resident's note.  

## 2022-09-11 NOTE — Progress Notes (Signed)
Internal Medicine Clinic Attending  Case discussed with Dr. Gomez-Caraballo  At the time of the visit.  We reviewed the resident's history and exam and pertinent patient test results.  I agree with the assessment, diagnosis, and plan of care documented in the resident's note.  

## 2022-09-20 ENCOUNTER — Ambulatory Visit: Payer: Medicaid Other

## 2022-09-20 ENCOUNTER — Inpatient Hospital Stay: Payer: Medicaid Other | Admitting: Pulmonary Disease

## 2022-09-25 ENCOUNTER — Encounter: Payer: Self-pay | Admitting: *Deleted

## 2022-09-27 ENCOUNTER — Other Ambulatory Visit (HOSPITAL_COMMUNITY): Payer: Self-pay

## 2022-10-01 ENCOUNTER — Ambulatory Visit: Payer: Medicaid Other | Admitting: Obstetrics and Gynecology

## 2022-10-03 ENCOUNTER — Other Ambulatory Visit: Payer: Self-pay

## 2022-10-04 ENCOUNTER — Inpatient Hospital Stay: Payer: Medicaid Other | Admitting: Pulmonary Disease

## 2022-10-05 ENCOUNTER — Other Ambulatory Visit: Payer: Self-pay

## 2022-10-07 NOTE — Progress Notes (Deleted)
10/08/22- 41 yoF former cigar smoker , MJ user, for sleep evaluation  Medical problem list includes HTN, LVH, Migraine, Hx Pulmonary Embolism, Asthmatic Bronchitis, DM2, Endometriosis, Depression,  -Albuterol hfa, Neb albuterol, Eliquis,  Epworth score- Body weight today-

## 2022-10-08 ENCOUNTER — Institutional Professional Consult (permissible substitution): Payer: Medicaid Other | Admitting: Internal Medicine

## 2022-10-09 ENCOUNTER — Other Ambulatory Visit: Payer: Self-pay

## 2022-10-15 ENCOUNTER — Other Ambulatory Visit: Payer: Self-pay

## 2022-10-16 ENCOUNTER — Encounter: Payer: Medicaid Other | Admitting: Student

## 2022-10-22 ENCOUNTER — Ambulatory Visit: Payer: Medicaid Other | Admitting: Obstetrics and Gynecology

## 2022-10-29 ENCOUNTER — Other Ambulatory Visit: Payer: Self-pay

## 2022-11-05 ENCOUNTER — Telehealth: Payer: Self-pay

## 2022-11-06 ENCOUNTER — Encounter: Payer: Self-pay | Admitting: Student

## 2022-11-06 ENCOUNTER — Ambulatory Visit: Payer: MEDICAID | Admitting: Student

## 2022-11-06 ENCOUNTER — Other Ambulatory Visit (HOSPITAL_COMMUNITY): Payer: Self-pay

## 2022-11-06 VITALS — BP 131/90 | HR 81 | Temp 98.1°F | Wt 214.1 lb

## 2022-11-06 DIAGNOSIS — Z87891 Personal history of nicotine dependence: Secondary | ICD-10-CM

## 2022-11-06 DIAGNOSIS — E119 Type 2 diabetes mellitus without complications: Secondary | ICD-10-CM

## 2022-11-06 DIAGNOSIS — I10 Essential (primary) hypertension: Secondary | ICD-10-CM

## 2022-11-06 DIAGNOSIS — R600 Localized edema: Secondary | ICD-10-CM | POA: Diagnosis not present

## 2022-11-06 DIAGNOSIS — Z86711 Personal history of pulmonary embolism: Secondary | ICD-10-CM | POA: Diagnosis not present

## 2022-11-06 MED ORDER — AMLODIPINE BESYLATE 5 MG PO TABS
5.0000 mg | ORAL_TABLET | Freq: Every day | ORAL | 11 refills | Status: DC
Start: 2022-11-06 — End: 2022-11-06
  Filled 2022-11-06: qty 30, 30d supply, fill #0

## 2022-11-06 MED ORDER — FUROSEMIDE 20 MG PO TABS
40.0000 mg | ORAL_TABLET | Freq: Every day | ORAL | 0 refills | Status: DC
Start: 2022-11-06 — End: 2022-11-06
  Filled 2022-11-06: qty 60, 30d supply, fill #0

## 2022-11-06 MED ORDER — TRULICITY 1.5 MG/0.5ML ~~LOC~~ SOPN
1.5000 mg | PEN_INJECTOR | SUBCUTANEOUS | 0 refills | Status: AC
Start: 2022-11-06 — End: 2023-02-04
  Filled 2022-11-06 – 2022-11-07 (×2): qty 2, 28d supply, fill #0

## 2022-11-06 MED ORDER — AMLODIPINE BESYLATE 5 MG PO TABS
5.0000 mg | ORAL_TABLET | Freq: Every day | ORAL | 3 refills | Status: DC
Start: 2022-11-06 — End: 2023-11-13
  Filled 2022-11-06: qty 30, 30d supply, fill #0
  Filled 2022-11-07 – 2023-02-20 (×2): qty 90, 90d supply, fill #0
  Filled 2023-05-31 – 2023-08-07 (×3): qty 90, 90d supply, fill #1

## 2022-11-06 MED ORDER — INSULIN PEN NEEDLE 32G X 4 MM MISC
1.0000 | Freq: Three times a day (TID) | 0 refills | Status: DC
Start: 2022-11-06 — End: 2023-05-01
  Filled 2022-11-06 – 2022-11-07 (×2): qty 200, 50d supply, fill #0

## 2022-11-06 MED ORDER — FUROSEMIDE 20 MG PO TABS
40.0000 mg | ORAL_TABLET | Freq: Every day | ORAL | 0 refills | Status: DC
  Filled 2022-11-06 – 2023-08-07 (×5): qty 60, 30d supply, fill #0

## 2022-11-06 MED ORDER — INSULIN PEN NEEDLE 32G X 4 MM MISC
1.0000 | Freq: Three times a day (TID) | 0 refills | Status: DC
Start: 2022-11-06 — End: 2022-11-06
  Filled 2022-11-06: qty 200, 50d supply, fill #0

## 2022-11-06 MED ORDER — TRULICITY 1.5 MG/0.5ML ~~LOC~~ SOAJ
1.5000 mg | SUBCUTANEOUS | 0 refills | Status: DC
Start: 2022-11-06 — End: 2022-11-06
  Filled 2022-11-06: qty 6, 84d supply, fill #0

## 2022-11-06 NOTE — Assessment & Plan Note (Signed)
Patient hypertensive to 138/90 today.  Says that she has ran out of her amlodipine has not been able to take it recently. Plan - Refill amlodipine 5 mg

## 2022-11-06 NOTE — Patient Instructions (Addendum)
Thank you, Dana Hughes for allowing [place] to provide your care today. Today we discussed chest pain.    I have ordered the following labs for you:  Lab Orders  No laboratory test(s) ordered today     Referrals ordered today:   Referral Orders  No referral(s) requested today     I have ordered the following medication/changed the following medications:   Stop the following medications: Medications Discontinued During This Encounter  Medication Reason   Insulin Pen Needle 32G X 4 MM MISC Reorder   amLODipine (NORVASC) 5 MG tablet Reorder   Dulaglutide (TRULICITY) 1.5 MG/0.5ML SOPN Reorder     Start the following medications: Meds ordered this encounter  Medications   amLODipine (NORVASC) 5 MG tablet    Sig: Take 1 tablet (5 mg total) by mouth daily.    Dispense:  30 tablet    Refill:  11   Dulaglutide (TRULICITY) 1.5 MG/0.5ML SOPN    Sig: Inject 1.5 mg into the skin once a week.    Dispense:  6 mL    Refill:  0   Insulin Pen Needle 32G X 4 MM MISC    Sig: Use as directed 4 times daily.    Dispense:  200 each    Refill:  0    IM program ok per Dr. Marchia Bond to change to 4 times a day via secure chat 12-12-20 sdm   furosemide (LASIX) 20 MG tablet    Sig: Take 2 tablets (40 mg total) by mouth daily.    Dispense:  60 tablet    Refill:  0     Follow up:  1 month     We look forward to seeing you next time. Please call our clinic at [phone] if you have any questions or concerns. The best time to call is Monday-Friday from 9am-4pm, but there is someone available 24/7. If after hours or the weekend, call the main hospital number and ask for the Internal Medicine Resident On-Call. If you need medication refills, please notify your pharmacy one week in advance and they will send [place] a request.   Thank you for trusting me with your care. Wishing you the best!  Lovie Macadamia MD [facility]

## 2022-11-06 NOTE — Assessment & Plan Note (Addendum)
Patient presents with 2-week history of progressive lower extremity edema bilaterally.  Patient has some sensation of tightness in the legs, but denies leg pain.  The calf is nontender, there is no erythema of the lower extremities.  Little concern for DVT in this patient.  Please see A&P note regarding history of pulmonary embolism for additional history.  Plan: - Lasix 40 p.o. daily - Recommend compression stockings throughout the day

## 2022-11-06 NOTE — Progress Notes (Signed)
Subjective:  CC: Lower extremity edema  HPI:  Dana Hughes is a 42 y.o. female with a past medical history stated below and presents today for lower extremity edema. Please see problem based assessment and plan for additional details.  Past Medical History:  Diagnosis Date   Anxiety    Arthritis    Asthma    Bronchitis    BV (bacterial vaginosis)    Candidiasis    Depression    Diabetes mellitus without complication (HCC)    Endometriosis    Headache(784.0)    migraines   Hypertension    no longer takes meds   Migraines    Muscle spasm    Ovarian cyst    Pelvic pain in female 09/29/2015   Scoliosis    Uterine fibroid     Current Outpatient Medications on File Prior to Visit  Medication Sig Dispense Refill   acetaminophen (TYLENOL) 500 MG tablet Take 2 tablets (1,000 mg total) by mouth every 6 (six) hours as needed for mild pain (or Fever >/= 101). 30 tablet 0   albuterol (PROVENTIL) (2.5 MG/3ML) 0.083% nebulizer solution Take 3 mLs (2.5 mg total) by nebulization every 6 (six) hours as needed for wheezing or shortness of breath. 75 mL 0   albuterol (VENTOLIN HFA) 108 (90 Base) MCG/ACT inhaler Inhale 2 puffs into the lungs every 6 (six) hours as needed for wheezing or shortness of breath. Reported on 09/29/2015     apixaban (ELIQUIS) 5 MG TABS tablet Take 1 tablet (5 mg total) by mouth 2 (two) times daily. 60 tablet 1   ARIPiprazole (ABILIFY) 20 MG tablet Take 1 tablet (20 mg total) by mouth daily. 30 tablet 3   bisacodyl (DULCOLAX) 5 MG EC tablet Take 1 tablet (5 mg total) by mouth daily as needed for moderate constipation. 15 tablet 0   diclofenac Sodium (VOLTAREN) 1 % GEL Apply 4 g topically 4 (four) times daily. 100 g 0   dicyclomine (BENTYL) 10 MG capsule Take 1 capsule (10 mg total) by mouth 4 (four) times daily as needed for spasms (abdominal pain). 30 capsule 1   gabapentin (NEURONTIN) 300 MG capsule Take 1 capsule (300 mg total) by mouth 3 (three) times daily.  90 capsule 3   glucose blood test strip Check your sugar in the morning before you eat breakfast, and one hour after a meal. 100 each 2   insulin aspart (NOVOLOG) 100 UNIT/ML FlexPen Inject 10 Units into the skin 2 (two) times daily with a meal. 6 mL 0   insulin glargine (LANTUS SOLOSTAR) 100 UNIT/ML Solostar Pen Inject 20 Units into the skin daily at 10pm. 6 mL 0   lamoTRIgine (LAMICTAL) 150 MG tablet Take 1 tablet (150 mg total) by mouth daily. 30 tablet 3   meclizine (ANTIVERT) 25 MG tablet Take 1 tablet (25 mg total) by mouth 3 (three) times daily as needed for dizziness. 30 tablet 0   metFORMIN (GLUCOPHAGE) 1000 MG tablet Take 1 tablet (1,000 mg total) by mouth 2 (two) times daily with a meal. 90 tablet 3   mirtazapine (REMERON) 45 MG tablet Take 1 tablet (45 mg total) by mouth at bedtime. 30 tablet 3   mometasone-formoterol (DULERA) 200-5 MCG/ACT AERO Inhale 2 puffs into the lungs 2 (two) times daily. 13 g 2   omeprazole (PRILOSEC) 40 MG capsule Take 1 capsule (40 mg total) by mouth daily. 30 capsule 1   SUMAtriptan (IMITREX) 50 MG tablet Take 1 tablet (50 mg  total) by mouth every 2 (two) hours as needed for migraine. May repeat in 2 hours if headache persists or recurs. Please take no more than 3 tablets (150 mg) in 24 hours. 15 tablet 0   Tasimelteon (HETLIOZ) 20 MG CAPS Take 20 mg by mouth at bedtime. 30 capsule 3   TRUEplus Lancets 28G MISC USE AS DIRECTED 100 each 0   AMBULATORY NON FORMULARY MEDICATION Nitroglycerin 0.125% gel - apply a pea size amount to your rectum three times daily x 6-8 weeks. (Patient not taking: Reported on 08/12/2022) 45 g 1   apixaban (ELIQUIS) 5 MG TABS tablet Take 2 tablets (10 mg total) by mouth 2 (two) times daily for 9 doses then 1 tablet twice daily thereafter (Patient not taking: Reported on 11/06/2022) 78 tablet 0   artificial tears (LACRILUBE) OINT ophthalmic ointment Place into both eyes at bedtime as needed for dry eyes. (Patient not taking: Reported on  11/06/2022) 3.5 g 0   fluticasone (FLONASE) 50 MCG/ACT nasal spray PLACE 1 SPRAY INTO BOTH NOSTRILS IN THE MORNING AND AT BEDTIME. (Patient taking differently: 2 sprays.) 16 g 1   polyethylene glycol powder (GLYCOLAX/MIRALAX) 17 GM/SCOOP powder Take 127.5 g by mouth daily. (Patient not taking: Reported on 11/06/2022) 119 g 0   potassium chloride SA (KLOR-CON M) 20 MEQ tablet Take 1 tablet (20 mEq total) by mouth daily for 3 days. 3 tablet 0   senna-docusate (SENOKOT S) 8.6-50 MG tablet Take 1 tablet by mouth daily. (Patient not taking: Reported on 11/06/2022) 90 tablet 2   No current facility-administered medications on file prior to visit.    Family History  Problem Relation Age of Onset   Migraines Mother    Cancer Father    Colon cancer Father    Lupus Paternal Grandmother    Cancer Paternal Grandmother    Breast cancer Paternal Grandmother    Migraines Maternal Aunt    Hypertension Maternal Aunt    Breast cancer Maternal Aunt    Colon cancer Paternal Aunt    Breast cancer Paternal Aunt    Ovarian cancer Cousin     Social History   Socioeconomic History   Marital status: Single    Spouse name: Not on file   Number of children: Not on file   Years of education: Not on file   Highest education level: Not on file  Occupational History   Not on file  Tobacco Use   Smoking status: Former    Types: Cigars   Smokeless tobacco: Never   Tobacco comments:    1-2 cigars/week  Vaping Use   Vaping status: Never Used  Substance and Sexual Activity   Alcohol use: Not Currently    Comment: occasional   Drug use: Yes    Frequency: 7.0 times per week    Types: Marijuana    Comment: 1  blunts a week, last use last night   Sexual activity: Yes    Birth control/protection: Condom, Surgical  Other Topics Concern   Not on file  Social History Narrative   Not on file   Social Determinants of Health   Financial Resource Strain: Medium Risk (11/08/2021)   Overall Financial Resource  Strain (CARDIA)    Difficulty of Paying Living Expenses: Somewhat hard  Food Insecurity: Food Insecurity Present (08/12/2022)   Hunger Vital Sign    Worried About Running Out of Food in the Last Year: Sometimes true    Ran Out of Food in the Last Year: Never true  Transportation Needs: No Transportation Needs (08/12/2022)   PRAPARE - Administrator, Civil Service (Medical): No    Lack of Transportation (Non-Medical): No  Physical Activity: Not on file  Stress: Not on file  Social Connections: Moderately Isolated (08/20/2022)   Social Connection and Isolation Panel [NHANES]    Frequency of Communication with Friends and Family: More than three times a week    Frequency of Social Gatherings with Friends and Family: More than three times a week    Attends Religious Services: 1 to 4 times per year    Active Member of [employer] or Organizations: No    Attends Banker Meetings: Never    Marital Status: Never married  Intimate Partner Violence: Not At Risk (08/12/2022)   Humiliation, Afraid, Rape, and Kick questionnaire    Fear of Current or Ex-Partner: No    Emotionally Abused: No    Physically Abused: No    Sexually Abused: No    Review of Systems: ROS negative except for what is noted on the assessment and plan.  Objective:   Vitals:   11/06/22 1538 11/06/22 1634  BP: (!) 138/90 (!) 131/90  Pulse: 83 81  Temp: 98.1 F (36.7 C)   TempSrc: Oral   SpO2: 97%   Weight: 214 lb 1.6 oz (97.1 kg)     Physical Exam: Constitutional: well-appearing in no acute distress HENT: normocephalic atraumatic, mucous membranes moist Eyes: conjunctiva non-erythematous Neck: supple Cardiovascular: regular rate and rhythm, no m/r/g Pulmonary/Chest: normal work of breathing on room air, lungs clear to auscultation bilaterally Abdominal: soft, non-tender MSK: normal bulk and tone Skin: warm and dry 1+ pitting edema to mid shin bilaterally. Psych: Normal mood and affect      Assessment & Plan:  Hypertension Patient hypertensive to 138/90 today.  Says that she has ran out of her amlodipine has not been able to take it recently. Plan - Refill amlodipine 5 mg  History of pulmonary embolism Ms. Shellhammer is a 42 year old female with past medical history of hypertension and, type 2 diabetes, MDD, IDA, PE who was last admitted on 08/11/2022 for acute unprovoked pulmonary embolism. There were signs of right heart strain on echo which required thrombolysis.  She was discharged on 08/16/2022 on Eliquis.  Patient comes in with 1 history of swelling of the legs and dry cough. She says her cough is productive of clear sputum or clear mucus.  She uses her Proventil and Ventolin inhalers about 3 times a day each.  She says overall her shortness of breath is improving and she is able to walk further now than before. She continue to endorse some pleuritic chest pain She endorses abdominal distention and weight gain. She says she has gained about 15 pounds in the last 2 weeks. She has been compliant with her anticoagulation.  On exam today she has 1+ edema to the midshin, her lungs are clear to auscultation, she does not have elevated JVD. She does not have significant orthopnea when lying on the exam table. Her oxygen saturation was 100% on room air, and did not drop during a walking trial.  Assessment: This patient's findings are consistent with her history of pulmonary embolism. The patient had evidence of right heart strain on echo during her hospital admission (08/12/2022).  There was some discussion during their admission regarding potential chronic pulmonary emboli or elevated pulmonary artery pressures, I agree that the patient has risk factors for this. The patient has been unable to to follow-up  with pulmonology so far, and has not had repeat echo since discharge. The patient does not have significant orthopnea, crackles on exam, or elevated JVD to make me believe that this  patient is experiencing decompensated heart failure.   Plan: - Ordered repeat echo to assess cardiac function, potential signs of pulmonary hypertension. - Urged the importance of following up with pulmonology - Urged to be importance of following up with sleep medicine for which she has an appointment. - Follow-up 1 month  Lower extremity edema Patient presents with 2-week history of progressive lower extremity edema bilaterally.  Patient has some sensation of tightness in the legs, but denies leg pain.  The calf is nontender, there is no erythema of the lower extremities.  Little concern for DVT in this patient.  Please see A&P note regarding history of pulmonary embolism for additional history.  Plan: - Lasix 40 p.o. daily - Recommend compression stockings throughout the day   Patient seen with Dr. Sullivan Lone MD [facility]  PGY-1 Pager: [phone]  Phone: [phone] Date 11/06/2022  Time 5:46 PM

## 2022-11-06 NOTE — Assessment & Plan Note (Addendum)
Dana Hughes is a 42 year old female with past medical history of hypertension and, type 2 diabetes, MDD, IDA, PE who was last admitted on 08/11/2022 for acute unprovoked pulmonary embolism. There were signs of right heart strain on echo which required thrombolysis.  She was discharged on 08/16/2022 on Eliquis.  Patient comes in with 1 history of swelling of the legs and dry cough. She says her cough is productive of clear sputum or clear mucus.  She uses her Proventil and Ventolin inhalers about 3 times a day each.  She says overall her shortness of breath is improving and she is able to walk further now than before. She continue to endorse some pleuritic chest pain She endorses abdominal distention and weight gain. She says she has gained about 15 pounds in the last 2 weeks. She has been compliant with her anticoagulation.  On exam today she has 1+ edema to the midshin, her lungs are clear to auscultation, she does not have elevated JVD. She does not have significant orthopnea when lying on the exam table. Her oxygen saturation was 100% on room air, and did not drop during a walking trial.  Assessment: This patient's findings are consistent with her history of pulmonary embolism. The patient had evidence of right heart strain on echo during her hospital admission (08/12/2022).  There was some discussion during their admission regarding potential chronic pulmonary emboli or elevated pulmonary artery pressures, I agree that the patient has risk factors for this. The patient has been unable to to follow-up with pulmonology so far, and has not had repeat echo since discharge. The patient does not have significant orthopnea, crackles on exam, or elevated JVD to make me believe that this patient is experiencing decompensated heart failure.   Plan: - Ordered repeat echo to assess cardiac function, potential signs of pulmonary hypertension. - Urged the importance of following up with pulmonology - Urged to be  importance of following up with sleep medicine for which she has an appointment. - Follow-up 1 month

## 2022-11-07 ENCOUNTER — Other Ambulatory Visit (HOSPITAL_COMMUNITY): Payer: Self-pay

## 2022-11-07 ENCOUNTER — Other Ambulatory Visit: Payer: Self-pay

## 2022-11-07 NOTE — Progress Notes (Signed)
Internal Medicine Clinic Attending  I was physically present during the key portions of the resident provided service and participated in the medical decision making of patient's management care. I reviewed pertinent patient test results.  The assessment, diagnosis, and plan were formulated together and I agree with the documentation in the resident's note.  Person struggling with deconditioning and low exertional capacity three months after unprovoked PE. She has some LE edema which may be from elevated pulmonary pressures and likely from being more sedentary. She has DOE, she ambulated well in the office with no desaturations, so does not need supplemental oxygen. She is doing well on anticoagulation which we will continue indefinitely. She has untreated sleep apnea, is going for sleep study soon. Will treat the LE edema with compression stockings and trial of lasix. She needs more conditioning, repeat echo, continue anticoagulation, and start nightly CPAP.   Dana Hong, MD FACP

## 2022-11-08 ENCOUNTER — Other Ambulatory Visit: Payer: Self-pay

## 2022-11-09 ENCOUNTER — Telehealth: Payer: Self-pay

## 2022-11-09 NOTE — Telephone Encounter (Signed)
Prior Authorization for patient (Trulicity) came through on cover my meds was submitted with last office notes and labs awaiting approval or denial.  UJW:[id number]

## 2022-11-12 ENCOUNTER — Other Ambulatory Visit: Payer: Self-pay

## 2022-11-12 NOTE — Telephone Encounter (Signed)
Decision:Approved Sherrell Weir (Key: [id number]) PA Case ID #: [id number] Need Help? Call us at [phone] Outcome Approved on July 19 Approved. TRULICITY 1.5MG /0.5ML Soln Pen-inj is approved from 11/09/2022 to 11/09/2023. All strengths of the drug are approved. Authorization Expiration Date: 11/09/2023 Drug Trulicity 1.5MG /0.5ML pen-injectors ePA cloud logo Form [employer] Electronic Prior Authorization Form

## 2022-11-13 ENCOUNTER — Other Ambulatory Visit: Payer: Self-pay

## 2022-11-17 NOTE — Progress Notes (Deleted)
11/20/22- 70 yoF former smoker for sleep evaluation  Medical problem list includes Migraine, HTN, hx Pulmonary Embolism, Asthma, DM2, Anxiety/ Depression, Endometriosis, FEAnemia,  CTaChest PE 08/12/22->> IR Thrombolytic  IMPRESSION: 1. Marked severity bilateral pulmonary embolism with right heart strain (RV/LV Ratio = 1.31) which has been associated with an increased risk of morbidity and mortality. 2. Mild patchy right upper lobe and left lower lobe infiltrates. 3. Evidence of prior cholecystectomy.

## 2022-11-20 ENCOUNTER — Institutional Professional Consult (permissible substitution): Payer: Medicaid Other | Admitting: Internal Medicine

## 2022-11-20 ENCOUNTER — Other Ambulatory Visit: Payer: Self-pay

## 2022-11-20 NOTE — Progress Notes (Unsigned)
11/22/22- 55 yoF former smoker for sleep evaluation  Medical problem list includes Migraine, HTN, hx Pulmonary Embolism, Asthma, DM2, Anxiety/ Depression, Endometriosis, FEAnemia,  CTaChest PE 08/12/22->> IR Thrombolytic  IMPRESSION: 1. Marked severity bilateral pulmonary embolism with right heart strain (RV/LV Ratio = 1.31) which has been associated with an increased risk of morbidity and mortality. 2. Mild patchy right upper lobe and left lower lobe infiltrates. 3. Evidence of prior cholecystectomy .

## 2022-11-22 ENCOUNTER — Other Ambulatory Visit (HOSPITAL_COMMUNITY)
Admission: RE | Admit: 2022-11-22 | Discharge: 2022-11-22 | Disposition: A | Discharge status: Home / Self Care | Payer: MEDICAID | Source: Ambulatory Visit | Attending: Obstetrics and Gynecology | Admitting: Obstetrics and Gynecology

## 2022-11-22 ENCOUNTER — Other Ambulatory Visit: Payer: Self-pay

## 2022-11-22 ENCOUNTER — Ambulatory Visit (INDEPENDENT_AMBULATORY_CARE_PROVIDER_SITE_OTHER): Payer: MEDICAID | Admitting: Internal Medicine

## 2022-11-22 ENCOUNTER — Ambulatory Visit: Payer: MEDICAID | Admitting: Obstetrics and Gynecology

## 2022-11-22 ENCOUNTER — Other Ambulatory Visit (HOSPITAL_COMMUNITY): Payer: Self-pay

## 2022-11-22 ENCOUNTER — Encounter: Payer: Self-pay | Admitting: Internal Medicine

## 2022-11-22 ENCOUNTER — Other Ambulatory Visit: Payer: Self-pay | Admitting: Internal Medicine

## 2022-11-22 VITALS — BP 123/86 | HR 80 | Wt 216.1 lb

## 2022-11-22 VITALS — BP 138/70 | HR 90 | Temp 97.8°F | Ht 63.5 in | Wt 215.0 lb

## 2022-11-22 DIAGNOSIS — Z3201 Encounter for pregnancy test, result positive: Secondary | ICD-10-CM | POA: Diagnosis not present

## 2022-11-22 DIAGNOSIS — R0683 Snoring: Secondary | ICD-10-CM | POA: Diagnosis not present

## 2022-11-22 DIAGNOSIS — E1165 Type 2 diabetes mellitus with hyperglycemia: Secondary | ICD-10-CM | POA: Diagnosis not present

## 2022-11-22 DIAGNOSIS — Z794 Long term (current) use of insulin: Secondary | ICD-10-CM

## 2022-11-22 DIAGNOSIS — Z113 Encounter for screening for infections with a predominantly sexual mode of transmission: Secondary | ICD-10-CM | POA: Diagnosis present

## 2022-11-22 DIAGNOSIS — Z3A01 Less than 8 weeks gestation of pregnancy: Secondary | ICD-10-CM

## 2022-11-22 DIAGNOSIS — Z86711 Personal history of pulmonary embolism: Secondary | ICD-10-CM | POA: Diagnosis not present

## 2022-11-22 DIAGNOSIS — R109 Unspecified abdominal pain: Secondary | ICD-10-CM

## 2022-11-22 DIAGNOSIS — Z3490 Encounter for supervision of normal pregnancy, unspecified, unspecified trimester: Secondary | ICD-10-CM | POA: Insufficient documentation

## 2022-11-22 DIAGNOSIS — F513 Sleepwalking [somnambulism]: Secondary | ICD-10-CM

## 2022-11-22 LAB — POCT URINALYSIS DIP (DEVICE)
Bilirubin Urine: NEGATIVE
Glucose, UA: 500 mg/dL — AB
Hgb urine dipstick: NEGATIVE
Ketones, ur: NEGATIVE mg/dL
Leukocytes,Ua: NEGATIVE
Nitrite: POSITIVE — AB
Protein, ur: NEGATIVE mg/dL
Specific Gravity, Urine: >=1.03 (ref 1.005–1.030)
Urobilinogen, UA: 0.2 mg/dL (ref 0.0–1.0)
pH: 6 (ref 5.0–8.0)

## 2022-11-22 LAB — POCT PREGNANCY, URINE: Preg Test, Ur: POSITIVE — AB

## 2022-11-22 MED ORDER — ENOXAPARIN SODIUM 100 MG/ML IJ SOSY
1.0000 mg/kg | PREFILLED_SYRINGE | Freq: Two times a day (BID) | INTRAMUSCULAR | 3 refills | Status: DC
Start: 2022-11-22 — End: 2022-12-06
  Filled 2022-11-22: qty 60, 30d supply, fill #0
  Filled 2022-11-22: qty 30, 15d supply, fill #0
  Filled 2022-11-23: qty 30, 16d supply, fill #0
  Filled 2022-11-23 (×2): qty 30, 15d supply, fill #0

## 2022-11-22 NOTE — Progress Notes (Unsigned)
GYNECOLOGY VISIT  Patient name: Dana Hughes MRN [id number]  Date of birth: [birth year] Chief Complaint:   Gynecologic Exam   History:  Dana Hughes is a 42 y.o. [phone] being seen today for irregular bleeding intermittent sharp abdominal pain.  Haivng nausea and vomiting on Sunday.  Last took pregnancy test last week. Had some bleeding about 2 days ago. no Swtich to tylenol; has been taking ibuprofen for pain Had difficulties with attempting to conceive in the past Notes being very sleepy  Bleeding pattern has been more irregular with intermittent sharp pain May have just 2 days of bleeding   Past Medical History:  Diagnosis Date   Anxiety    Arthritis    Asthma    Bronchitis    BV (bacterial vaginosis)    Candidiasis    Depression    Diabetes mellitus without complication (HCC)    Endometriosis    Headache(784.0)    migraines   Hypertension    no longer takes meds   Migraines    Muscle spasm    Ovarian cyst    Pelvic pain in female 09/29/2015   Scoliosis    Uterine fibroid     Past Surgical History:  Procedure Laterality Date   BIOPSY  08/12/2020   Procedure: BIOPSY;  Surgeon: Napoleon Form, MD;  Location: [facility];  Service: Endoscopy;;   CHOLECYSTECTOMY     COLONOSCOPY WITH PROPOFOL N/A 08/12/2020   Procedure: COLONOSCOPY WITH PROPOFOL;  Surgeon: Napoleon Form, MD;  Location: [facility]OS[facility];  Service: Endoscopy;  Laterality: N/A;   ESOPHAGOGASTRODUODENOSCOPY (EGD) WITH PROPOFOL N/A 08/12/2020   Procedure: ESOPHAGOGASTRODUODENOSCOPY (EGD) WITH PROPOFOL;  Surgeon: Napoleon Form, MD;  Location: [facility] ENDOS[facility];  Service: Endoscopy;  Laterality: N/A;   IR ANGIOGRAM PULMONARY BILATERAL SELECTIVE  08/22/2022   IR ANGIOGRAM SELECTIVE EACH ADDITIONAL VESSEL  08/22/2022   IR ANGIOGRAM SELECTIVE EACH ADDITIONAL VESSEL  08/22/2022   IR INFUSION THROMBOL ARTERIAL INITIAL (MS)  08/13/2022   IR THROMB F/U EVAL ART/VEN FINAL DAY (MS)  08/14/2022   IR US GUIDE  VASC ACCESS RIGHT  08/13/2022   POLYPECTOMY  08/12/2020   Procedure: POLYPECTOMY;  Surgeon: Napoleon Form, MD;  Location: [facility];  Service: Endoscopy;;   TUBAL LIGATION      The following portions of the patient's history were reviewed and updated as appropriate: allergies, current medications, past family history, past medical history, past social history, past surgical history and problem list.   Health Maintenance:   Last pap     Component Value Date/Time   DIAGPAP  05/05/2020 1416    - Negative for intraepithelial lesion or malignancy (NILM)   DIAGPAP  03/21/2017 0000    NEGATIVE FOR INTRAEPITHELIAL LESIONS OR MALIGNANCY.   HPVHIGH Negative 05/05/2020 1416   ADEQPAP  05/05/2020 1416    Satisfactory for evaluation; transformation zone component PRESENT.   ADEQPAP  03/21/2017 0000    Satisfactory for evaluation  endocervical/transformation zone component PRESENT.    High Risk HPV: Positive  Adequacy:  Satisfactory for evaluation, transformation zone component PRESENT  Diagnosis:  Atypical squamous cells of undetermined significance (ASC-US)  Last mammogram: 08/2022 BIRADS 1   Review of Systems:  Pertinent items are noted in HPI. Comprehensive review of systems was otherwise negative.   Objective:  Physical Exam BP 123/86   Pulse 80   Wt 216 lb 1.6 oz (98 kg)   LMP 11/20/2022   BMI 38.28 kg/m    Physical Exam Vitals and  nursing note reviewed.  Constitutional:      Appearance: Normal appearance.  HENT:     Head: Normocephalic and atraumatic.  Pulmonary:     Effort: Pulmonary effort is normal.  Abdominal:     Comments: Mild LUQ tenderness  Skin:    General: Skin is warm and dry.  Neurological:     General: No focal deficit present.     Mental Status: She is alert.  Psychiatric:        Mood and Affect: Mood normal.        Behavior: Behavior normal.        Thought Content: Thought content normal.        Judgment: Judgment normal.         Assessment & Plan:   1. Positive pregnancy test Positive UPT in clinic, bHCG to assess likelihood of pregnancy visualization, suspect ~4-[redacted] weeks pregnant but due to irregular bleeding patterns recently therefore LMP not entirely clear.  - ABO/Rh - Antibody screen - CBC - HIV (Save tube for possible reflex) - HIV Antibody (routine testing w rflx) - HgB A1c - Hepatitis C antibody - Hepatitis B surface antigen - Basic Metabolic Panel (BMET) - RPR - Rubella screen - US OB LESS THAN 14 WEEKS WITH OB TRANSVAGINAL; Future - Beta hCG quant (ref lab) - Culture, OB Urine  2. Screening examination for STI STI screening per request - Cervicovaginal ancillary only - Culture, OB Urine  3. Type 2 diabetes mellitus with hyperglycemia, with long-term current use of insulin (HCC) A1c today given prior elevated reading and new current pregnancy. Briefly reviewd poorly controlled diabetes can have negative consequ entes in early and late pregnancy  - HgB A1c  4. History of pulmonary embolism Given new pregnancy diagnosis in clinic, will switch to lovenox - enoxaparin (LOVENOX) 100 MG/ML injection; Inject 0.975 mLs (97.5 mg total) into the skin every 12 (twelve) hours.  Dispense: 117 mL; Refill: 3   Routine preventative health maintenance measures emphasized.  Lorriane Shire, MD [facility], [facility]

## 2022-11-22 NOTE — Assessment & Plan Note (Signed)
Probable OSA- discussed. She is describing frequent sleep walking. This may reflect her known anxiety issues, sleep disturbance by respiratory events, or other factors. We should have an in-center study with observer and with EEG leads  to identify events, associated sleep stages, possible seizure activity.

## 2022-11-22 NOTE — Patient Instructions (Signed)
Order- schedule split night sleep study   dx Snoring, Sleep walking  We will go over results nd decide what to do at your return appointment

## 2022-11-23 ENCOUNTER — Other Ambulatory Visit: Payer: Self-pay

## 2022-11-26 ENCOUNTER — Telehealth: Payer: Self-pay | Admitting: General Practice

## 2022-11-26 ENCOUNTER — Other Ambulatory Visit: Payer: Self-pay | Admitting: Obstetrics and Gynecology

## 2022-11-26 DIAGNOSIS — Z3201 Encounter for pregnancy test, result positive: Secondary | ICD-10-CM

## 2022-11-26 NOTE — Telephone Encounter (Deleted)
-----   Message from Lorriane Shire sent at 11/26/2022 12:04 PM EDT ----- Patient needs repeat bHCG as original lab was negative and upt in clinic was positive

## 2022-11-26 NOTE — Telephone Encounter (Signed)
-----   Message from Lorriane Shire sent at 11/26/2022 12:04 PM EDT ----- Patient needs repeat bHCG as original lab was negative and upt in clinic was positive

## 2022-11-26 NOTE — Telephone Encounter (Signed)
Called patient and informed her of results and recommendation for repeat bhcg. After further discussion with Dr Briscoe Deutscher, will have patient return to office tomorrow for bhcg & dating ultrasound on Wednesday. If bhcg is negative, will reschedule Wednesday ultrasound for GYN. Patient aware of appts and plan of care.

## 2022-11-27 ENCOUNTER — Telehealth: Payer: Self-pay

## 2022-11-27 ENCOUNTER — Other Ambulatory Visit (HOSPITAL_COMMUNITY): Payer: Self-pay

## 2022-11-27 ENCOUNTER — Other Ambulatory Visit: Payer: Self-pay

## 2022-11-27 ENCOUNTER — Other Ambulatory Visit: Payer: Self-pay | Admitting: Obstetrics and Gynecology

## 2022-11-27 ENCOUNTER — Other Ambulatory Visit: Payer: MEDICAID

## 2022-11-27 DIAGNOSIS — N760 Acute vaginitis: Secondary | ICD-10-CM

## 2022-11-27 MED ORDER — METRONIDAZOLE 0.75 % VA GEL
1.0000 | Freq: Every day | VAGINAL | 1 refills | Status: DC
Start: 2022-11-27 — End: 2023-05-01
  Filled 2022-11-27: qty 70, 5d supply, fill #0
  Filled 2022-12-05: qty 70, 7d supply, fill #0
  Filled 2022-12-14: qty 70, 7d supply, fill #1

## 2022-11-27 NOTE — Telephone Encounter (Signed)
Called Pt to go over + Gonorrhea test results, no answer, left VM will call back later.

## 2022-11-27 NOTE — Telephone Encounter (Signed)
-----   Message from Lorriane Shire sent at 11/27/2022  3:48 PM EDT ----- Notified via mychart of result and need to present for treatment   Addressed in separate encounter

## 2022-11-27 NOTE — Telephone Encounter (Signed)
2nd attempt to reach Pt regard positive test results, still no answer, left VM again. Will retry later.

## 2022-11-28 ENCOUNTER — Other Ambulatory Visit (HOSPITAL_COMMUNITY): Payer: Self-pay

## 2022-11-28 ENCOUNTER — Other Ambulatory Visit: Payer: MEDICAID

## 2022-11-28 ENCOUNTER — Telehealth: Payer: Self-pay

## 2022-11-28 ENCOUNTER — Other Ambulatory Visit: Payer: Self-pay | Admitting: Obstetrics and Gynecology

## 2022-11-28 ENCOUNTER — Encounter: Payer: Self-pay | Admitting: *Deleted

## 2022-11-28 VITALS — Ht 63.5 in | Wt 215.0 lb

## 2022-11-28 DIAGNOSIS — B9689 Other specified bacterial agents as the cause of diseases classified elsewhere: Secondary | ICD-10-CM

## 2022-11-28 DIAGNOSIS — N939 Abnormal uterine and vaginal bleeding, unspecified: Secondary | ICD-10-CM

## 2022-11-28 DIAGNOSIS — A549 Gonococcal infection, unspecified: Secondary | ICD-10-CM

## 2022-11-28 DIAGNOSIS — Z3201 Encounter for pregnancy test, result positive: Secondary | ICD-10-CM

## 2022-11-28 DIAGNOSIS — N3000 Acute cystitis without hematuria: Secondary | ICD-10-CM

## 2022-11-28 MED ORDER — NITROFURANTOIN MONOHYD MACRO 100 MG PO CAPS
100.0000 mg | ORAL_CAPSULE | Freq: Two times a day (BID) | ORAL | 1 refills | Status: DC
Start: 2022-11-28 — End: 2023-03-14
  Filled 2022-11-28 – 2022-12-14 (×3): qty 14, 7d supply, fill #0

## 2022-11-28 MED ORDER — CEFTRIAXONE SODIUM 500 MG IJ SOLR
500.0000 mg | Freq: Once | INTRAMUSCULAR | Status: AC
Start: 2022-11-28 — End: 2022-11-28
  Administered 2022-11-28: 500 mg via INTRAMUSCULAR

## 2022-11-28 NOTE — Progress Notes (Cosign Needed Addendum)
Dana Hughes here for Rocephin  Injection.  Injection administered without complication.   Henrietta Dine, CMA 11/28/2022  4:01 PM

## 2022-11-28 NOTE — Telephone Encounter (Signed)
Called PT to see if she saw My Chart message regarding test results, she stated no, so advised of + Gonorrhea results & BV,advised that will send BV treatment to pharmacy on file & treat her Gonorrhea in office, Pt comes in today for HCG labs, will treat then.,Pt verbalized understanding.

## 2022-11-29 ENCOUNTER — Other Ambulatory Visit: Payer: Self-pay

## 2022-11-29 ENCOUNTER — Other Ambulatory Visit: Payer: MEDICAID

## 2022-12-03 ENCOUNTER — Telehealth: Payer: Self-pay | Admitting: General Practice

## 2022-12-03 ENCOUNTER — Ambulatory Visit (HOSPITAL_COMMUNITY)
Admission: RE | Admit: 2022-12-03 | Discharge: 2022-12-03 | Disposition: A | Discharge status: Home / Self Care | Payer: MEDICAID | Source: Ambulatory Visit | Attending: Obstetrics and Gynecology | Admitting: Obstetrics and Gynecology

## 2022-12-03 DIAGNOSIS — N939 Abnormal uterine and vaginal bleeding, unspecified: Secondary | ICD-10-CM | POA: Diagnosis present

## 2022-12-03 NOTE — Telephone Encounter (Signed)
Called patient and informed her of negative bhcg results and that she may restart her usual blood thinner per Dr Briscoe Deutscher. Patient verbalized understanding.

## 2022-12-04 ENCOUNTER — Ambulatory Visit (HOSPITAL_COMMUNITY): Admission: RE | Admit: 2022-12-04 | Payer: MEDICAID | Source: Ambulatory Visit

## 2022-12-05 ENCOUNTER — Other Ambulatory Visit: Payer: Self-pay

## 2022-12-05 ENCOUNTER — Other Ambulatory Visit (HOSPITAL_COMMUNITY): Payer: Self-pay

## 2022-12-06 ENCOUNTER — Other Ambulatory Visit (HOSPITAL_COMMUNITY): Payer: Self-pay

## 2022-12-06 ENCOUNTER — Other Ambulatory Visit: Payer: Self-pay | Admitting: Obstetrics and Gynecology

## 2022-12-06 ENCOUNTER — Other Ambulatory Visit: Payer: Self-pay

## 2022-12-06 ENCOUNTER — Telehealth: Payer: Self-pay | Admitting: *Deleted

## 2022-12-06 DIAGNOSIS — N838 Other noninflammatory disorders of ovary, fallopian tube and broad ligament: Secondary | ICD-10-CM

## 2022-12-06 MED ORDER — APIXABAN 5 MG PO TABS
5.0000 mg | ORAL_TABLET | Freq: Two times a day (BID) | ORAL | 1 refills | Status: DC
  Filled 2022-12-06 – 2023-02-20 (×3): qty 60, 30d supply, fill #0

## 2022-12-06 NOTE — Telephone Encounter (Signed)
Called patient she is aware of the following . 1 and 2.

## 2022-12-06 NOTE — Telephone Encounter (Addendum)
-----   Message from [facility] sent at 12/06/2022  8:40 AM EDT ----- Needs pelvic MRI scheduled   8/15  1310  Called pt and informed her of pelvic US result and need for MRI. Pt voiced understanding and agreed to be notified of MRI appt via Mychart.  After speaking with pt, MRI appt was scheduled on 8/22 @ 2pm - [facility].

## 2022-12-11 ENCOUNTER — Institutional Professional Consult (permissible substitution): Payer: Medicaid Other | Admitting: Plastic Surgery

## 2022-12-13 ENCOUNTER — Ambulatory Visit (HOSPITAL_COMMUNITY): Payer: MEDICAID

## 2022-12-14 ENCOUNTER — Other Ambulatory Visit: Payer: Self-pay

## 2022-12-14 ENCOUNTER — Other Ambulatory Visit (HOSPITAL_COMMUNITY): Payer: Self-pay

## 2022-12-17 ENCOUNTER — Ambulatory Visit (HOSPITAL_COMMUNITY): Admission: RE | Admit: 2022-12-17 | Payer: MEDICAID | Source: Ambulatory Visit

## 2022-12-21 ENCOUNTER — Ambulatory Visit (HOSPITAL_COMMUNITY): Payer: MEDICAID

## 2022-12-25 ENCOUNTER — Ambulatory Visit (HOSPITAL_COMMUNITY): Admission: RE | Admit: 2022-12-25 | Payer: MEDICAID | Source: Ambulatory Visit

## 2022-12-30 ENCOUNTER — Ambulatory Visit (HOSPITAL_COMMUNITY): Payer: MEDICAID

## 2023-01-04 ENCOUNTER — Telehealth: Payer: Self-pay | Admitting: *Deleted

## 2023-01-04 ENCOUNTER — Ambulatory Visit (HOSPITAL_COMMUNITY): Payer: MEDICAID

## 2023-01-04 NOTE — Telephone Encounter (Signed)
Call from patient states is having Chest Pain and shortness of breath. Requesting an appointment.  Patient was advised to call EMS now for transport to the ER.

## 2023-01-04 NOTE — Telephone Encounter (Signed)
Agree with Ed eval

## 2023-01-06 ENCOUNTER — Ambulatory Visit (HOSPITAL_BASED_OUTPATIENT_CLINIC_OR_DEPARTMENT_OTHER): Payer: MEDICAID | Attending: Internal Medicine | Admitting: Internal Medicine

## 2023-01-06 VITALS — Ht 63.5 in | Wt 215.0 lb

## 2023-01-06 DIAGNOSIS — R0683 Snoring: Secondary | ICD-10-CM | POA: Diagnosis present

## 2023-01-06 DIAGNOSIS — E669 Obesity, unspecified: Secondary | ICD-10-CM | POA: Diagnosis not present

## 2023-01-06 DIAGNOSIS — Z6835 Body mass index (BMI) 35.0-35.9, adult: Secondary | ICD-10-CM | POA: Insufficient documentation

## 2023-01-06 DIAGNOSIS — F513 Sleepwalking [somnambulism]: Secondary | ICD-10-CM | POA: Insufficient documentation

## 2023-01-07 ENCOUNTER — Other Ambulatory Visit (HOSPITAL_BASED_OUTPATIENT_CLINIC_OR_DEPARTMENT_OTHER): Payer: Self-pay

## 2023-01-07 DIAGNOSIS — R0683 Snoring: Secondary | ICD-10-CM

## 2023-01-07 DIAGNOSIS — F513 Sleepwalking [somnambulism]: Secondary | ICD-10-CM

## 2023-01-08 ENCOUNTER — Ambulatory Visit (HOSPITAL_COMMUNITY): Admission: RE | Admit: 2023-01-08 | Payer: MEDICAID | Source: Ambulatory Visit

## 2023-01-12 NOTE — Procedures (Signed)
Patient Name: Dana Hughes, Dana Hughes Date: 01/06/2023 Gender: Female D.O.B: 05-May-1980 Age (years): 44 Referring Provider: Jetty Duhamel MD, ABSM Height (inches): 66 Interpreting Physician: Jetty Duhamel MD, ABSM Weight (lbs): 215 RPSGT: Cherylann Parr BMI: 35 MRN: [id number] Neck Size: 15.00  CLINICAL INFORMATION Sleep Study Type: NPSG Indication for sleep study: Obesity, Snoring, Witnessed Apnea / Gasping During Sleep Epworth Sleepiness Score: 10  SLEEP STUDY TECHNIQUE As per the AASM Manual for the Scoring of Sleep and Associated Events v2.3 (April 2016) with a hypopnea requiring 4% desaturations.  The channels recorded and monitored were frontal, central and occipital EEG, electrooculogram (EOG), submentalis EMG (chin), nasal and oral airflow, thoracic and abdominal wall motion, anterior tibialis EMG, snore microphone, electrocardiogram, and pulse oximetry.  MEDICATIONS Medications self-administered by patient taken the night of the study : none reported  SLEEP ARCHITECTURE The study was initiated at 9:54:03 PM and ended at 4:56:02 AM.  Sleep onset time was 0.4 minutes and the sleep efficiency was 97.3%. The total sleep time was 410.5 minutes.  Stage REM latency was 252.5 minutes.  The patient spent 0.2% of the night in stage N1 sleep, 79.5% in stage N2 sleep, 10.6% in stage N3 and 9.6% in REM.  Alpha intrusion was absent.  Supine sleep was 68.54%.  RESPIRATORY PARAMETERS The overall apnea/hypopnea index (AHI) was 1.9 per hour. There were 1 total apneas, including 1 obstructive, 0 central and 0 mixed apneas. There were 12 hypopneas and 0 RERAs.  The AHI during Stage REM sleep was 4.6 per hour.  AHI while supine was 2.1 per hour.  The mean oxygen saturation was 94.6%. The minimum SpO2 during sleep was 88.0%.  snoring was noted during this study.  CARDIAC DATA The 2 lead EKG demonstrated sinus rhythm. The mean heart rate was 82.5 beats per minute. Other EKG  findings include: None.  LEG MOVEMENT DATA The total PLMS were 0 with a resulting PLMS index of 0.0. Associated arousal with leg movement index was 0.1 .  IMPRESSIONS - No significant obstructive sleep apnea occurred during this study (AHI = 1.9/h). - The patient had minimal or no oxygen desaturation during the study (Min O2 = 88.0%, Mean 94.6%). - No snoring was audible during this study. - No cardiac abnormalities were noted during this study. - Clinically significant periodic limb movements did not occur during sleep. No significant associated arousals. - No sleep walking or other significant parasomnia noted.  DIAGNOSIS - Normal study  RECOMMENDATIONS - Manage for symptoms based on clinical judgment. - Sleep hygiene should be reviewed to assess factors that may improve sleep quality. - Weight management and regular exercise should be initiated or continued if appropriate.  [Electronically signed] 01/12/2023 12:21 PM  Jetty Duhamel MD, ABSM Diplomate, American Board of Sleep Medicine NPI: [id number]                          Jetty Duhamel Diplomate, American Board of Sleep Medicine  ELECTRONICALLY SIGNED ON:  01/12/2023, 12:17 PM [facility] PH: [phone]   FX: [phone] ACCREDITED BY THE AMERICAN ACADEMY OF SLEEP MEDICINE

## 2023-01-21 NOTE — Progress Notes (Deleted)
11/22/22- 65 yoF former smoker for sleep evaluation    Patient pregnant Medical problem list includes Migraine, HTN, hx PulmonaryEmbolism/ Lovenox4/24/ , Asthma, DM2, Anxiety/ Depression, Endometriosis, FEAnemia,  Epworth score-22 Body weight-215 lbs Aware of snoring. Frequent sleep walking x 5 years. No injuries. Usually gets up, goes to kitchen for water, then back to bed. No ENT surgery. Several family with OSA. Brother on CPAP. Anxiety meds help sleep. Occ AM coffee. Bedtime 1:30-2:30 AM. Up 5-6 times to bathroom, sometimes unaware. Occ palpitation. Ovcc sharp pains in anterior chest wall. Has just learned that she is pregnant again. CTaChest PE 08/12/22->> IR Thrombolytic  IMPRESSION: 1. Marked severity bilateral pulmonary embolism with right heart strain (RV/LV Ratio = 1.31) which has been associated with an increased risk of morbidity and mortality. 2. Mild patchy right upper lobe and left lower lobe infiltrates. 3. Evidence of prior cholecystectomy  01/22/23- 42 yoF former smoker, Pregnant, for f/u after Normal sleep study, complicated by Migraine, HTN, hx PulmonaryEmbolism/ Lovenox 4/24/ , Asthma, DM2, Anxiety/ Depression, Endometriosis, FEAnemia,  Epworth score-22 Body weight today- HST 01/06/23 AHI 1.9/hr, desat to 88% (mean 94.6%), body weight 215 lbs.    ROS-see HPI   + = positive Constitutional:    weight loss, night sweats, fevers, chills, fatigue, lassitude. HEENT:    headaches, difficulty swallowing, tooth/dental problems, sore throat,       sneezing, itching, ear ache, nasal congestion, post nasal drip, snoring CV:    chest pain, orthopnea, PND, swelling in lower extremities, anasarca,                                  dizziness, palpitations Resp:   shortness of breath with exertion or at rest.                productive cough,   non-productive cough, coughing up of blood.              change in color of mucus.  wheezing.   Skin:    rash or lesions. GI:  No-   heartburn,  indigestion, abdominal pain, nausea, vomiting, diarrhea,                 change in bowel habits, loss of appetite GU: dysuria, change in color of urine, no urgency or frequency.   flank pain. MS:   joint pain, stiffness, decreased range of motion, back pain. Neuro-     nothing unusual Psych:  change in mood or affect.  +depression or +anxiety.   memory loss.  OBJ- Physical Exam General- Alert, Oriented, Affect-appropriate, Distress- none acute, +obese Skin- rash-none, lesions- none, excoriation- none, +tatoos, +stretch marks Lymphadenopathy- none Head- atraumatic            Eyes- Gross vision intact, PERRLA, conjunctivae and secretions clear            Ears- Hearing, canals-normal            Nose- +snorting, no-Septal dev, mucus, polyps, erosion, perforation             Throat- Mallampati III , +tongue stud , drainage- none, tonsils- atrophic, +teeth Neck- flexible , trachea midline, no stridor , thyroid nl, carotid no bruit Chest - symmetrical excursion , unlabored           Heart/CV- RRR , no murmur , no gallop  , no rub, nl s1 s2                           -  JVD- none , edema- none, stasis changes- none, varices- none           Lung- clear to P&A, wheeze- none, cough- none , dullness-none, rub- none           Chest wall-  Abd-  Br/ Gen/ Rectal- Not done, not indicated Extrem- cyanosis- none, clubbing, none, atrophy- none, strength- nl Neuro- grossly intact to observation  .

## 2023-01-22 ENCOUNTER — Ambulatory Visit: Payer: MEDICAID | Admitting: Internal Medicine

## 2023-01-22 NOTE — Telephone Encounter (Signed)
Open error 

## 2023-01-23 ENCOUNTER — Other Ambulatory Visit (HOSPITAL_COMMUNITY): Payer: Self-pay

## 2023-02-04 ENCOUNTER — Ambulatory Visit: Payer: MEDICAID

## 2023-02-13 ENCOUNTER — Other Ambulatory Visit (HOSPITAL_COMMUNITY)
Admission: RE | Admit: 2023-02-13 | Discharge: 2023-02-13 | Disposition: A | Discharge status: Home / Self Care | Payer: MEDICAID | Source: Ambulatory Visit | Attending: Family Medicine | Admitting: Family Medicine

## 2023-02-13 ENCOUNTER — Ambulatory Visit: Payer: MEDICAID | Admitting: *Deleted

## 2023-02-13 VITALS — BP 133/87 | HR 76 | Ht 63.0 in | Wt 213.0 lb

## 2023-02-13 DIAGNOSIS — N760 Acute vaginitis: Secondary | ICD-10-CM | POA: Insufficient documentation

## 2023-02-13 DIAGNOSIS — B9689 Other specified bacterial agents as the cause of diseases classified elsewhere: Secondary | ICD-10-CM | POA: Insufficient documentation

## 2023-02-13 DIAGNOSIS — Z9189 Other specified personal risk factors, not elsewhere classified: Secondary | ICD-10-CM

## 2023-02-13 NOTE — Progress Notes (Signed)
Pt presents with request for STI testing due to recent unprotected sex. Per chart review, pt had +GC on 8/1 and received treatment. She is currently having clear vaginal discharge. Pt requests self swab as well as HIV, Syphilis, Hep B and Hep C testing. She will be notified of test results and treatment indicated if any via Mychart. Pt also stated that she missed her MRI appt last month and is still having irregular periods and abdominal pain. She would like to reschedule the MRI - appt made for 10/25 10:30 am @ [facility]. Pt voiced understanding of all information and instructions.

## 2023-02-14 ENCOUNTER — Other Ambulatory Visit (HOSPITAL_COMMUNITY): Payer: Self-pay

## 2023-02-14 ENCOUNTER — Other Ambulatory Visit: Payer: Self-pay | Admitting: Obstetrics and Gynecology

## 2023-02-14 DIAGNOSIS — N760 Acute vaginitis: Secondary | ICD-10-CM

## 2023-02-14 LAB — CERVICOVAGINAL ANCILLARY ONLY
Bacterial Vaginitis (gardnerella): POSITIVE — AB
Candida Glabrata: NEGATIVE
Candida Vaginitis: NEGATIVE
Chlamydia: NEGATIVE
Comment: NEGATIVE
Comment: NEGATIVE
Comment: NEGATIVE
Comment: NEGATIVE
Comment: NEGATIVE
Comment: NORMAL
Neisseria Gonorrhea: NEGATIVE
Trichomonas: NEGATIVE

## 2023-02-14 LAB — RPR: RPR Ser Ql: NONREACTIVE

## 2023-02-14 LAB — HIV ANTIBODY (ROUTINE TESTING W REFLEX): HIV Screen 4th Generation wRfx: NONREACTIVE

## 2023-02-14 LAB — HEPATITIS B SURFACE ANTIGEN: Hepatitis B Surface Ag: NEGATIVE

## 2023-02-14 LAB — HEPATITIS C ANTIBODY: Hep C Virus Ab: NONREACTIVE

## 2023-02-14 MED ORDER — METRONIDAZOLE 500 MG PO TABS
500.0000 mg | ORAL_TABLET | Freq: Two times a day (BID) | ORAL | 0 refills | Status: AC
Start: 2023-02-14 — End: 2023-02-27
  Filled 2023-02-14 – 2023-02-20 (×3): qty 14, 7d supply, fill #0

## 2023-02-15 ENCOUNTER — Ambulatory Visit (HOSPITAL_COMMUNITY)
Admission: RE | Admit: 2023-02-15 | Discharge: 2023-02-15 | Disposition: A | Discharge status: Home / Self Care | Payer: MEDICAID | Source: Ambulatory Visit | Attending: Obstetrics and Gynecology | Admitting: Obstetrics and Gynecology

## 2023-02-15 DIAGNOSIS — N838 Other noninflammatory disorders of ovary, fallopian tube and broad ligament: Secondary | ICD-10-CM | POA: Diagnosis present

## 2023-02-15 MED ORDER — GADOBUTROL 1 MMOL/ML IV SOLN
9.0000 mL | Freq: Once | INTRAVENOUS | Status: AC | PRN
  Administered 2023-02-15: 9 mL via INTRAVENOUS

## 2023-02-19 ENCOUNTER — Telehealth: Payer: Self-pay | Admitting: *Deleted

## 2023-02-19 ENCOUNTER — Other Ambulatory Visit (HOSPITAL_COMMUNITY): Payer: Self-pay

## 2023-02-19 MED ORDER — AMOXICILLIN 500 MG PO CAPS
500.0000 mg | ORAL_CAPSULE | Freq: Three times a day (TID) | ORAL | 0 refills | Status: DC
  Filled 2023-02-19 – 2023-02-20 (×3): qty 21, 7d supply, fill #0

## 2023-02-19 MED ORDER — CHLORHEXIDINE GLUCONATE 0.12 % MT SOLN
Freq: Two times a day (BID) | OROMUCOSAL | 0 refills | Status: DC
  Filled 2023-02-19: qty 473, 16d supply, fill #0
  Filled 2023-02-20 (×2): qty 473, 10d supply, fill #0

## 2023-02-19 MED ORDER — IBUPROFEN 800 MG PO TABS
800.0000 mg | ORAL_TABLET | Freq: Three times a day (TID) | ORAL | 0 refills | Status: DC | PRN
  Filled 2023-02-19 – 2023-02-20 (×3): qty 20, 7d supply, fill #0

## 2023-02-19 NOTE — Telephone Encounter (Signed)
Hi Tamura, I just called you to see how you are doing but no answer; I left a message. Can you call the office to discuss your symptoms? But if you are having chest pain, you need to go to the ER. Thanks Economist

## 2023-02-19 NOTE — Telephone Encounter (Signed)
Please refer to message below.----- Message -----From: Prince Solian MSent: 02/18/2023   8:05 PM EDTTo: Binnie Kand Front Desk PoolSubject: Appointment Request                          Appointment Request From: Dana Robson FultonWith Provider: Rana Snare [facility]]Preferred Date Range: 03/04/2023 - 11/13/2024Preferred Times: Any TimeReason for visit: Office VisitComments:very painful sharp pains in the center of my chest last ten minutes the ease up i took little breaths then it happened again for another ten minutes my lungs been feeling funny off and on deep breaths hurt so i take small baby breaths. and my lower back hurting

## 2023-02-20 ENCOUNTER — Other Ambulatory Visit (HOSPITAL_COMMUNITY): Payer: Self-pay

## 2023-02-20 ENCOUNTER — Other Ambulatory Visit: Payer: Self-pay

## 2023-02-21 NOTE — Telephone Encounter (Signed)
Available appt tomorrow - I called pt to see how's she's doing and if she needs to be seen. Pt stated she's doing ok and does not need an appt. But she wants to schedule one for next week - call transferred to the front office. An appt has been schedule w/Dr Mickie Bail on 11/19.

## 2023-02-24 NOTE — Progress Notes (Deleted)
11/22/22- 24 yoF former smoker for sleep evaluation    Patient pregnant Medical problem list includes Migraine, HTN, hx PulmonaryEmbolism/ Lovenox4/24/ , Asthma, DM2, Anxiety/ Depression, Endometriosis, FEAnemia,  Epworth score-22 Body weight-215 lbs Aware of snoring. Frequent sleep walking x 5 years. No injuries. Usually gets up, goes to kitchen for water, then back to bed. No ENT surgery. Several family with OSA. Brother on CPAP. Anxiety meds help sleep. Occ AM coffee. Bedtime 1:30-2:30 AM. Up 5-6 times to bathroom, sometimes unaware. Occ palpitation. Ovcc sharp pains in anterior chest wall. Has just learned that she is pregnant again. CTaChest PE 08/12/22->> IR Thrombolytic  IMPRESSION: 1. Marked severity bilateral pulmonary embolism with right heart strain (RV/LV Ratio = 1.31) which has been associated with an increased risk of morbidity and mortality. 2. Mild patchy right upper lobe and left lower lobe infiltrates. 3. Evidence of prior cholecystectomy  02/26/23- 42 yoF former smoker for sleep evaluation    Patient pregnant ?? Medical problem list includes Migraine, HTN,  hx PulmonaryEmbolism/ Lovenox4/24/ , Asthma, DM2, Anxiety/ Depression, Endometriosis, FEAnemia,  NPSG 01/06/23- WNL- AHI 1.9/hr, desat to 88%/mean 94.6%, body weight 215 lbs Epworth score- Body weight-   ROS-see HPI   + = positive Constitutional:    weight loss, night sweats, fevers, chills, fatigue, lassitude. HEENT:    headaches, difficulty swallowing, tooth/dental problems, sore throat,       sneezing, itching, ear ache, nasal congestion, post nasal drip, snoring CV:    chest pain, orthopnea, PND, swelling in lower extremities, anasarca,                                  dizziness, palpitations Resp:   shortness of breath with exertion or at rest.                productive cough,   non-productive cough, coughing up of blood.              change in color of mucus.  wheezing.   Skin:    rash or lesions. GI:  No-    heartburn, indigestion, abdominal pain, nausea, vomiting, diarrhea,                 change in bowel habits, loss of appetite GU: dysuria, change in color of urine, no urgency or frequency.   flank pain. MS:   joint pain, stiffness, decreased range of motion, back pain. Neuro-     nothing unusual Psych:  change in mood or affect.  +depression or +anxiety.   memory loss.  OBJ- Physical Exam General- Alert, Oriented, Affect-appropriate, Distress- none acute, +obese Skin- rash-none, lesions- none, excoriation- none, +tatoos, +stretch marks Lymphadenopathy- none Head- atraumatic            Eyes- Gross vision intact, PERRLA, conjunctivae and secretions clear            Ears- Hearing, canals-normal            Nose- +snorting, no-Septal dev, mucus, polyps, erosion, perforation             Throat- Mallampati III , +tongue stud , drainage- none, tonsils- atrophic, +teeth Neck- flexible , trachea midline, no stridor , thyroid nl, carotid no bruit Chest - symmetrical excursion , unlabored           Heart/CV- RRR , no murmur , no gallop  , no rub, nl s1 s2                           -  JVD- none , edema- none, stasis changes- none, varices- none           Lung- clear to P&A, wheeze- none, cough- none , dullness-none, rub- none           Chest wall-  Abd-  Br/ Gen/ Rectal- Not done, not indicated Extrem- cyanosis- none, clubbing, none, atrophy- none, strength- nl Neuro- grossly intact to observation  .

## 2023-02-25 ENCOUNTER — Telehealth: Payer: Self-pay

## 2023-02-25 NOTE — Telephone Encounter (Signed)
Requesting Rx shampoo for Lice, states she has lice and would like this to be treated. Requesting a call back.

## 2023-02-26 ENCOUNTER — Ambulatory Visit: Payer: MEDICAID | Admitting: Internal Medicine

## 2023-02-26 NOTE — Telephone Encounter (Signed)
RTC to patient.  States her kids  and in the household are Lice.  Has identified them in her children's head as well. Knows what they look like when asked. . Would like to get a prescription sent for the Lice Shampoo sent to the [facility] at [facility].

## 2023-02-27 ENCOUNTER — Other Ambulatory Visit: Payer: Self-pay | Admitting: Student

## 2023-02-27 ENCOUNTER — Other Ambulatory Visit: Payer: Self-pay

## 2023-02-27 MED ORDER — SPINOSAD 0.9 % EX SUSP
1.0000 | Freq: Once | CUTANEOUS | 0 refills | Status: AC
  Filled 2023-02-27: qty 120, 30d supply, fill #0

## 2023-02-27 NOTE — Addendum Note (Signed)
Addended by: Carlynn Purl C on: 02/27/2023 04:04 PM   Modules accepted: Orders

## 2023-02-28 ENCOUNTER — Other Ambulatory Visit: Payer: Self-pay

## 2023-02-28 ENCOUNTER — Encounter: Payer: Self-pay | Admitting: Internal Medicine

## 2023-03-01 ENCOUNTER — Other Ambulatory Visit: Payer: Self-pay

## 2023-03-12 ENCOUNTER — Encounter: Payer: MEDICAID | Admitting: Student

## 2023-03-12 ENCOUNTER — Institutional Professional Consult (permissible substitution): Payer: Self-pay | Admitting: Plastic Surgery

## 2023-03-14 ENCOUNTER — Encounter: Payer: Self-pay | Admitting: Student

## 2023-03-14 ENCOUNTER — Ambulatory Visit: Payer: MEDICAID | Admitting: Student

## 2023-03-14 ENCOUNTER — Other Ambulatory Visit: Payer: Self-pay

## 2023-03-14 VITALS — BP 127/86 | HR 99 | Temp 97.6°F | Ht 63.5 in | Wt 212.0 lb

## 2023-03-14 DIAGNOSIS — E119 Type 2 diabetes mellitus without complications: Secondary | ICD-10-CM | POA: Diagnosis not present

## 2023-03-14 DIAGNOSIS — Z3202 Encounter for pregnancy test, result negative: Secondary | ICD-10-CM | POA: Diagnosis not present

## 2023-03-14 DIAGNOSIS — G44209 Tension-type headache, unspecified, not intractable: Secondary | ICD-10-CM | POA: Diagnosis not present

## 2023-03-14 DIAGNOSIS — Z794 Long term (current) use of insulin: Secondary | ICD-10-CM

## 2023-03-14 DIAGNOSIS — Z86711 Personal history of pulmonary embolism: Secondary | ICD-10-CM

## 2023-03-14 DIAGNOSIS — Z7901 Long term (current) use of anticoagulants: Secondary | ICD-10-CM | POA: Diagnosis not present

## 2023-03-14 DIAGNOSIS — R519 Headache, unspecified: Secondary | ICD-10-CM | POA: Insufficient documentation

## 2023-03-14 DIAGNOSIS — G44201 Tension-type headache, unspecified, intractable: Secondary | ICD-10-CM

## 2023-03-14 LAB — POCT URINE PREGNANCY: Preg Test, Ur: NEGATIVE

## 2023-03-14 MED ORDER — PROMETHAZINE HCL 25 MG PO TABS
25.0000 mg | ORAL_TABLET | Freq: Once | ORAL | Status: AC
  Administered 2023-03-14: 25 mg via ORAL

## 2023-03-14 MED ORDER — KETOROLAC TROMETHAMINE 30 MG/ML IJ SOLN
30.0000 mg | Freq: Once | INTRAMUSCULAR | Status: AC
  Administered 2023-03-14: 30 mg via INTRAMUSCULAR

## 2023-03-14 NOTE — Patient Instructions (Signed)
Thank you, Ms.Andria Meuse for allowing [place] to provide your care today.  I have ordered the following labs for you:   Lab Orders         POCT Urine Pregnancy       I have ordered the following medication/changed the following medications:   Stop the following medications: Medications Discontinued During This Encounter  Medication Reason   nitrofurantoin, macrocrystal-monohydrate, (MACROBID) 100 MG capsule      Start the following medications: Meds ordered this encounter  Medications   ketorolac (TORADOL) 30 MG/ML injection 30 mg   promethazine (PHENERGAN) tablet 25 mg     Follow up:  2 weeks   Remember:   Head to the ED if your headache is not subsiding or your develop weakness.   Call us if you develop a fever, worsening cough.   Should you have any questions or concerns please call the internal medicine clinic at [phone].     Manuela Neptune, MD [facility]

## 2023-03-14 NOTE — Assessment & Plan Note (Addendum)
Pt presents with a headache that started about a week and a half ago. It started on the occipital region and now it is up to her temples and around her forehead. It has been constant all the time. It is pounding in nature, and started after she was stressing about her finances and job. She feels she cannot do anything as it hurts significantly. She rates this pain a 7/19. She tried sumatriptan with no help. Tylenol PM and ibupropfen 800mg  once in separate occassions, and these did not help. Has hx of migraine HA but different that them as she would have photophobia. Did have similar headache in the past and toradol helped. Most likely has OSA and needed CPAP machine. Unable to go to her follow up appointment to get the machine.  -Toradol  -promethazine if not pregnant -FU with sleep medicine

## 2023-03-14 NOTE — Assessment & Plan Note (Signed)
States that she has bilateral pain in the base of her lungs that is sharp. Does not radiate anywhere. Has been having worsening cough but no sputum. Some chills. Tachycardic today. Has had no sick contacts. No fevers. No phlegm. She smokes marijuana but not cigarettes or cigars. No longer on OCPs. No recent travel or long car rides. ompliant with her anticoagulation. No CP, no PND, orthopnea. Lungs are clear to auscultation. No BLE edema was supposed to follow up with an echo but has not gotten it done as she did not hear about scheduling that appointment. Has been unable to complete many studies and follow ups because of her finances.   -CTM symptoms patient is already on anticoagulation   -monitor for fevers or worsening coughs and call us if symptoms worsen

## 2023-03-14 NOTE — Assessment & Plan Note (Signed)
Due for A1C and urine. Patient will follow up in 2 weeks with [place]. She was in a hurry today and needed to leave.

## 2023-03-14 NOTE — Progress Notes (Signed)
CC:  Chief Complaint  Patient presents with   Follow-up   Migraine     Pt stated that she has been having cluster migraines she stated she has tried all otc meds and nothing is touching it    HPI:  [name] is a 42 y.o. female living with a history stated below and presents today for evaluation of a migraine HA. Please see problem based assessment and plan for additional details.  Past Medical History:  Diagnosis Date   Anxiety    Arthritis    Asthma    Bronchitis    BV (bacterial vaginosis)    Candidiasis    Depression    Diabetes mellitus without complication (HCC)    Endometriosis    Headache(784.0)    migraines   Hypertension    no longer takes meds   Migraines    Muscle spasm    Ovarian cyst    Pelvic pain in female 09/29/2015   Scoliosis    Uterine fibroid     Current Outpatient Medications on File Prior to Visit  Medication Sig Dispense Refill   metroNIDAZOLE (METROGEL) 0.75 % vaginal gel Place 1 Applicatorful vaginally at bedtime for 5 days 70 g 1   acetaminophen (TYLENOL) 500 MG tablet Take 2 tablets (1,000 mg total) by mouth every 6 (six) hours as needed for mild pain (or Fever >/= 101). 30 tablet 0   albuterol (PROVENTIL) (2.5 MG/3ML) 0.083% nebulizer solution Take 3 mLs (2.5 mg total) by nebulization every 6 (six) hours as needed for wheezing or shortness of breath. 75 mL 0   albuterol (VENTOLIN HFA) 108 (90 Base) MCG/ACT inhaler Inhale 2 puffs into the lungs every 6 (six) hours as needed for wheezing or shortness of breath. Reported on 09/29/2015     AMBULATORY NON FORMULARY MEDICATION Nitroglycerin 0.125% gel - apply a pea size amount to your rectum three times daily x 6-8 weeks. 45 g 1   amLODipine (NORVASC) 5 MG tablet Take 1 tablet (5 mg total) by mouth daily. 90 tablet 3   amoxicillin (AMOXIL) 500 MG capsule Take 1 capsule (500 mg total) by mouth every 8 (eight) hours until finished 21 capsule 0   apixaban (ELIQUIS) 5 MG TABS tablet Take 1  tablet (5 mg total) by mouth 2 (two) times daily. 60 tablet 1   ARIPiprazole (ABILIFY) 20 MG tablet Take 1 tablet (20 mg total) by mouth daily. 30 tablet 3   artificial tears (LACRILUBE) OINT ophthalmic ointment Place into both eyes at bedtime as needed for dry eyes. 3.5 g 0   bisacodyl (DULCOLAX) 5 MG EC tablet Take 1 tablet (5 mg total) by mouth daily as needed for moderate constipation. 15 tablet 0   chlorhexidine (PERIDEX) 0.12 % solution Swish twice daily for one minute, then spit out. Use for 10 days. 473 mL 0   diclofenac Sodium (VOLTAREN) 1 % GEL Apply 4 g topically 4 (four) times daily. 100 g 0   dicyclomine (BENTYL) 10 MG capsule Take 1 capsule (10 mg total) by mouth 4 (four) times daily as needed for spasms (abdominal pain). 30 capsule 1   fluticasone (FLONASE) 50 MCG/ACT nasal spray PLACE 1 SPRAY INTO BOTH NOSTRILS IN THE MORNING AND AT BEDTIME. (Patient taking differently: 2 sprays.) 16 g 1   furosemide (LASIX) 20 MG tablet Take 2 tablets (40 mg total) by mouth daily. 60 tablet 0   gabapentin (NEURONTIN) 300 MG capsule Take 1 capsule (300 mg total) by mouth 3 (  three) times daily. 90 capsule 3   glucose blood test strip Check your sugar in the morning before you eat breakfast, and one hour after a meal. 100 each 2   ibuprofen (ADVIL) 800 MG tablet Take 1 tablet every 8 hours as needed for pain 20 tablet 0   insulin aspart (NOVOLOG) 100 UNIT/ML FlexPen Inject 10 Units into the skin 2 (two) times daily with a meal. 6 mL 0   insulin glargine (LANTUS SOLOSTAR) 100 UNIT/ML Solostar Pen Inject 20 Units into the skin daily at 10pm. 6 mL 0   Insulin Pen Needle 32G X 4 MM MISC Use as directed 4 times daily. 200 each 0   lamoTRIgine (LAMICTAL) 150 MG tablet Take 1 tablet (150 mg total) by mouth daily. 30 tablet 3   meclizine (ANTIVERT) 25 MG tablet Take 1 tablet (25 mg total) by mouth 3 (three) times daily as needed for dizziness. 30 tablet 0   metFORMIN (GLUCOPHAGE) 1000 MG tablet Take 1 tablet  (1,000 mg total) by mouth 2 (two) times daily with a meal. 90 tablet 3   mirtazapine (REMERON) 45 MG tablet Take 1 tablet (45 mg total) by mouth at bedtime. 30 tablet 3   mometasone-formoterol (DULERA) 200-5 MCG/ACT AERO Inhale 2 puffs into the lungs 2 (two) times daily. 13 g 2   omeprazole (PRILOSEC) 40 MG capsule Take 1 capsule (40 mg total) by mouth daily. 30 capsule 1   polyethylene glycol powder (GLYCOLAX/MIRALAX) 17 GM/SCOOP powder Take 127.5 g by mouth daily. 119 g 0   potassium chloride SA (KLOR-CON M) 20 MEQ tablet Take 1 tablet (20 mEq total) by mouth daily for 3 days. 3 tablet 0   senna-docusate (SENOKOT S) 8.6-50 MG tablet Take 1 tablet by mouth daily. 90 tablet 2   SUMAtriptan (IMITREX) 50 MG tablet Take 1 tablet (50 mg total) by mouth every 2 (two) hours as needed for migraine. May repeat in 2 hours if headache persists or recurs. Please take no more than 3 tablets (150 mg) in 24 hours. 15 tablet 0   Tasimelteon (HETLIOZ) 20 MG CAPS Take 20 mg by mouth at bedtime. 30 capsule 3   TRUEplus Lancets 28G MISC USE AS DIRECTED 100 each 0   No current facility-administered medications on file prior to visit.    Family History  Problem Relation Age of Onset   Migraines Mother    Cancer Father    Colon cancer Father    Lupus Paternal Grandmother    Cancer Paternal Grandmother    Breast cancer Paternal Grandmother    Migraines Maternal Aunt    Hypertension Maternal Aunt    Breast cancer Maternal Aunt    Colon cancer Paternal Aunt    Breast cancer Paternal Aunt    Ovarian cancer Cousin     Social History   Socioeconomic History   Marital status: Single    Spouse name: Not on file   Number of children: Not on file   Years of education: Not on file   Highest education level: Not on file  Occupational History   Not on file  Tobacco Use   Smoking status: Former    Types: Cigars   Smokeless tobacco: Never   Tobacco comments:    1-2 cigars/week  Vaping Use   Vaping status:  Never Used  Substance and Sexual Activity   Alcohol use: Not Currently    Comment: occasional   Drug use: Yes    Frequency: 7.0 times per week  Types: Marijuana    Comment: 1  blunts a week, last use last night   Sexual activity: Yes    Birth control/protection: Condom, Surgical  Other Topics Concern   Not on file  Social History Narrative   Not on file   Social Determinants of Health   Financial Resource Strain: Medium Risk (11/08/2021)   Overall Financial Resource Strain (CARDIA)    Difficulty of Paying Living Expenses: Somewhat hard  Food Insecurity: Food Insecurity Present (08/12/2022)   Hunger Vital Sign    Worried About Running Out of Food in the Last Year: Sometimes true    Ran Out of Food in the Last Year: Never true  Transportation Needs: No Transportation Needs (08/12/2022)   PRAPARE - Administrator, Civil Service (Medical): No    Lack of Transportation (Non-Medical): No  Physical Activity: Not on file  Stress: Not on file  Social Connections: Moderately Isolated (08/20/2022)   Social Connection and Isolation Panel [NHANES]    Frequency of Communication with Friends and Family: More than three times a week    Frequency of Social Gatherings with Friends and Family: More than three times a week    Attends Religious Services: 1 to 4 times per year    Active Member of [employer] or Organizations: No    Attends Banker Meetings: Never    Marital Status: Never married  Intimate Partner Violence: Not At Risk (08/12/2022)   Humiliation, Afraid, Rape, and Kick questionnaire    Fear of Current or Ex-Partner: No    Emotionally Abused: No    Physically Abused: No    Sexually Abused: No    Review of Systems: ROS negative except for what is noted on the assessment and plan.  Vitals:   03/14/23 1325  BP: 127/86  Pulse: 99  Temp: 97.6 F (36.4 C)  TempSrc: Oral  SpO2: 100%  Weight: 212 lb (96.2 kg)  Height: 5' 3.5" (1.613 m)    Physical  Exam: Constitutional: well-appearing in no acute distress HENT: normocephalic atraumatic, mucous membranes moist Eyes: conjunctiva non-erythematous PERRLA Cardiovascular: increased rate and rhythm, no m/r/g, no BLE edema Pulmonary/Chest: normal work of breathing on room air, lungs clear to auscultation bilaterally Abdominal: soft, non-tender, non-distended MSK: normal bulk and tone Neurological: alert & oriented x 3, no focal deficit, 5/5 motor strength. Sensation intact.  Skin: warm and dry Psych: normal mood and behavior  Assessment & Plan:   Patient seen with Dr. Criselda Peaches  Headache Pt presents with a headache that started about a week and a half ago. It started on the occipital region and now it is up to her temples and around her forehead. It has been constant all the time. It is pounding in nature, and started after she was stressing about her finances and job. She feels she cannot do anything as it hurts significantly. She rates this pain a 7/19. She tried sumatriptan with no help. Tylenol PM and ibupropfen 800mg  once in separate occassions, and these did not help. Has hx of migraine HA but different that them as she would have photophobia. Did have similar headache in the past and toradol helped. Most likely has OSA and needed CPAP machine. Unable to go to her follow up appointment to get the machine.  -Toradol  -promethazine if not pregnant -FU with sleep medicine  Diabetes mellitus, type 2 (HCC) Due for A1C and urine. Patient will follow up in 2 weeks with [place]. She was in a hurry today  and needed to leave.  History of pulmonary embolism States that she has bilateral pain in the base of her lungs that is sharp. Does not radiate anywhere. Has been having worsening cough but no sputum. Some chills. Tachycardic today. Has had no sick contacts. No fevers. No phlegm. She smokes marijuana but not cigarettes or cigars. No longer on OCPs. No recent travel or long car rides. ompliant with her  anticoagulation. No CP, no PND, orthopnea. Lungs are clear to auscultation. No BLE edema was supposed to follow up with an echo but has not gotten it done as she did not hear about scheduling that appointment. Has been unable to complete many studies and follow ups because of her finances.   -CTM symptoms patient is already on anticoagulation   -monitor for fevers or worsening coughs and call us if symptoms worsen   Manuela Neptune, MD [facility] Internal Medicine, PGY-1 Phone: [phone] Date 03/14/2023 Time 5:14 PM

## 2023-03-19 ENCOUNTER — Encounter: Payer: Self-pay | Admitting: Student

## 2023-03-19 ENCOUNTER — Ambulatory Visit: Payer: MEDICAID | Admitting: Student

## 2023-03-19 ENCOUNTER — Other Ambulatory Visit: Payer: Self-pay

## 2023-03-19 ENCOUNTER — Telehealth: Payer: Self-pay

## 2023-03-19 DIAGNOSIS — G43909 Migraine, unspecified, not intractable, without status migrainosus: Secondary | ICD-10-CM | POA: Diagnosis not present

## 2023-03-19 DIAGNOSIS — G43009 Migraine without aura, not intractable, without status migrainosus: Secondary | ICD-10-CM

## 2023-03-19 MED ORDER — SUMATRIPTAN SUCCINATE 50 MG PO TABS
50.0000 mg | ORAL_TABLET | ORAL | 0 refills | Status: DC | PRN
  Filled 2023-03-19: qty 4, 2d supply, fill #0

## 2023-03-19 NOTE — Progress Notes (Signed)
  [facility] Internal Medicine Residency Telephone Encounter Continuity Care Appointment  HPI:  This telephone encounter was created for [name] on 03/19/2023 for the following purpose/cc. " Migraine"   Past Medical History:  Past Medical History:  Diagnosis Date   Anxiety    Arthritis    Asthma    Bronchitis    BV (bacterial vaginosis)    Candidiasis    Depression    Diabetes mellitus without complication (HCC)    Endometriosis    Headache(784.0)    migraines   Hypertension    no longer takes meds   Migraines    Muscle spasm    Ovarian cyst    Pelvic pain in female 09/29/2015   Scoliosis    Uterine fibroid      ROS:  Denies any vision changes   Assessment / Plan / Recommendations:  Please see A&P under problem oriented charting for assessment of the patient's acute and chronic medical conditions.    #Migraine Patient reports headaches that she describes as similar to her migraine attacks. She reports trying tylenol and preventing excerebration factors but nothing seems to help.Patient was seen in the clinic on 11/21 due to similar complains and was given Toradol at that time which she reports helped some. Prior to that visit, she had tried excedrin with no help. She denies any vision changes, said the pain is in front of her head and is constant.Review of her medical history doesn't seems to suggest any contraindication to Triptans. Will prescribe sumatriptan 50 mg for just 4 doses and advice patient to go to the ED for further intervention if the migraines persist . Potential risks and side effect adequately discussed with patient. - Prescribe Sumatriptan 50 mg ( 4 tabs)  - As always, pt is advised that if symptoms worsen or new symptoms arise, they should go to an urgent care facility or to to ER for further evaluation.   Consent and Medical Decision Making:  Patient discussed with Dr.  Sol Blazing This is a telephone encounter between Dana Hughes and Kathleen Lime on 03/19/2023 for acute Migraine. The visit was conducted with the patient located at home and Kathleen Lime at [facility]. The patient's identity was confirmed using their DOB and current address. The patient has consented to being evaluated through a telephone encounter and understands the associated risks (an examination cannot be done and the patient may need to come in for an appointment) / benefits (allows the patient to remain at home, decreasing exposure to coronavirus). I personally spent  30  minutes on medical discussion.

## 2023-03-19 NOTE — Telephone Encounter (Signed)
Pt states she is having headache, requesting an appt for today. No open slot available. Please call pt back.

## 2023-03-19 NOTE — Telephone Encounter (Signed)
RTC to patient her Migraine headache has returned would like something called in to her Pharmacy.  Stated that she received an injection last week and that helped her headache.  Headache did return after she went home  got some sleep.   Uses the [facility] on [address].

## 2023-03-19 NOTE — Telephone Encounter (Signed)
Return pt's call - stated h/a's are non-stop; she has tried cool/heating pad,Ibuprofen, Aleve,Excedrin migraine. An appt became available this afternoon; pt has no transportation - telehealth appt schedule w/Dr Mickie Bail @ 1515PM.

## 2023-03-20 ENCOUNTER — Other Ambulatory Visit: Payer: Self-pay

## 2023-03-20 NOTE — Progress Notes (Signed)
 Internal Medicine Clinic Attending  I was physically present during the key portions of the resident provided service and participated in the medical decision making of patient's management care. I reviewed pertinent patient test results.  The assessment, diagnosis, and plan were formulated together and I agree with the documentation in the resident's note.  Inez Catalina, MD

## 2023-03-25 NOTE — Progress Notes (Signed)
Internal Medicine Clinic Attending  I saw and evaluated the patient.  I personally confirmed the key portions of the history and exam documented by Dr.  Mickie Bail  and I reviewed pertinent patient test results.  The assessment, diagnosis, and plan were formulated together and I agree with the documentation in the resident's note. Dr. Mickie Bail to f/u by telephone for further recommendations.

## 2023-04-01 ENCOUNTER — Encounter: Payer: MEDICAID | Admitting: Student

## 2023-04-01 NOTE — Progress Notes (Signed)
This encounter was created in error - please disregard.

## 2023-04-11 ENCOUNTER — Encounter: Payer: MEDICAID | Admitting: Internal Medicine

## 2023-04-11 ENCOUNTER — Encounter: Payer: Self-pay | Admitting: Internal Medicine

## 2023-04-11 NOTE — Progress Notes (Deleted)
CC: follow up on headache and DMII  HPI:  Dana Hughes is a 42 y.o. with medical history of HTN, HLD, DMII, HFrEF, hx of PE, MDD, PTSD presenting to [facility] for a follow up her headaches and her DMII.   Please see problem-based list for further details, assessments, and plans.  Past Medical History:  Diagnosis Date   Anxiety    Arthritis    Asthma    BPPV (benign paroxysmal positional vertigo) 05/18/2022   Bronchitis    BV (bacterial vaginosis)    Candidiasis    Carpal tunnel syndrome, bilateral 01/16/2019   Chest pain of uncertain etiology 07/18/2021   Constipation 12/10/2020   Cystocele without uterine prolapse    Depression    Diabetes mellitus without complication (HCC)    Endometriosis    Exertional dyspnea 06/24/2021   Headache(784.0)    migraines   Hypertension    no longer takes meds   Migraines    Mild concentric left ventricular hypertrophy (LVH) 08/12/2020   Echo (07/2020):  1. Left ventricular ejection fraction, by estimation, is 45 to 50%. The  left ventricle has mildly decreased function. The left ventricle  demonstrates global hypokinesis. There is mild asymmetric left ventricular  hypertrophy of the basal-septal segment.  The interventricular septum is flattened in systole, consistent with right  ventricular pressure overload.   2. Right ve   Muscle spasm    Ovarian cyst    Pelvic pain in female 09/29/2015   Rectal bleeding 12/09/2020   FHx:   Father: colon cancer (46 yo)  Paternal aunt: colon cancer (~68 yo), breast cancer (unknown age).  Paternal gma: Breast cancer   Paternal 2nd cousin: Ovarian cancer     Colonoscopy (07/2020):  Perianal and digital rectal examinations: normal.  Findings:  - A patchy area of moderately nodular mucosa was found at the hepatic flexure and in the ascending colon.  - A patchy area of moderately no   Scoliosis    Thrombocytosis 08/12/2020   Uterine fibroid    Vulvovaginal candidiasis 05/15/2018    Current  Outpatient Medications (Endocrine & Metabolic):    insulin aspart (NOVOLOG) 100 UNIT/ML FlexPen, Inject 10 Units into the skin 2 (two) times daily with a meal.   insulin glargine (LANTUS SOLOSTAR) 100 UNIT/ML Solostar Pen, Inject 20 Units into the skin daily at 10pm.   metFORMIN (GLUCOPHAGE) 1000 MG tablet, Take 1 tablet (1,000 mg total) by mouth 2 (two) times daily with a meal.  Current Outpatient Medications (Cardiovascular):    amLODipine (NORVASC) 5 MG tablet, Take 1 tablet (5 mg total) by mouth daily.   furosemide (LASIX) 20 MG tablet, Take 2 tablets (40 mg total) by mouth daily.  Current Outpatient Medications (Respiratory):    albuterol (PROVENTIL) (2.5 MG/3ML) 0.083% nebulizer solution, Take 3 mLs (2.5 mg total) by nebulization every 6 (six) hours as needed for wheezing or shortness of breath.   albuterol (VENTOLIN HFA) 108 (90 Base) MCG/ACT inhaler, Inhale 2 puffs into the lungs every 6 (six) hours as needed for wheezing or shortness of breath. Reported on 09/29/2015   mometasone-formoterol (DULERA) 200-5 MCG/ACT AERO, Inhale 2 puffs into the lungs 2 (two) times daily.  Current Outpatient Medications (Analgesics):    acetaminophen (TYLENOL) 500 MG tablet, Take 2 tablets (1,000 mg total) by mouth every 6 (six) hours as needed for mild pain (or Fever >/= 101).   ibuprofen (ADVIL) 800 MG tablet, Take 1 tablet every 8 hours as needed for pain   SUMAtriptan (  IMITREX) 50 MG tablet, Take 1 tablet (50 mg total) by mouth every 2 (two) hours as needed for migraine. May repeat in 2 hours if headache persists or recurs. Please take no more than 3 tablets (150 mg) in 24 hours.   SUMAtriptan (IMITREX) 50 MG tablet, Take 1 tablet (50 mg total) by mouth every 2 (two) hours as needed for migraine. May repeat in 2 hours if headache persists or recurs.  Current Outpatient Medications (Hematological):    apixaban (ELIQUIS) 5 MG TABS tablet, Take 1 tablet (5 mg total) by mouth 2 (two) times daily.  Current  Outpatient Medications (Other):    metroNIDAZOLE (METROGEL) 0.75 % vaginal gel, Place 1 Applicatorful vaginally at bedtime for 5 days   AMBULATORY NON FORMULARY MEDICATION, Nitroglycerin 0.125% gel - apply a pea size amount to your rectum three times daily x 6-8 weeks.   amoxicillin (AMOXIL) 500 MG capsule, Take 1 capsule (500 mg total) by mouth every 8 (eight) hours until finished   ARIPiprazole (ABILIFY) 20 MG tablet, Take 1 tablet (20 mg total) by mouth daily.   artificial tears (LACRILUBE) OINT ophthalmic ointment, Place into both eyes at bedtime as needed for dry eyes.   bisacodyl (DULCOLAX) 5 MG EC tablet, Take 1 tablet (5 mg total) by mouth daily as needed for moderate constipation.   chlorhexidine (PERIDEX) 0.12 % solution, Swish twice daily for one minute, then spit out. Use for 10 days.   diclofenac Sodium (VOLTAREN) 1 % GEL, Apply 4 g topically 4 (four) times daily.   dicyclomine (BENTYL) 10 MG capsule, Take 1 capsule (10 mg total) by mouth 4 (four) times daily as needed for spasms (abdominal pain).   gabapentin (NEURONTIN) 300 MG capsule, Take 1 capsule (300 mg total) by mouth 3 (three) times daily.   glucose blood test strip, Check your sugar in the morning before you eat breakfast, and one hour after a meal.   Insulin Pen Needle 32G X 4 MM MISC, Use as directed 4 times daily.   lamoTRIgine (LAMICTAL) 150 MG tablet, Take 1 tablet (150 mg total) by mouth daily.   meclizine (ANTIVERT) 25 MG tablet, Take 1 tablet (25 mg total) by mouth 3 (three) times daily as needed for dizziness.   mirtazapine (REMERON) 45 MG tablet, Take 1 tablet (45 mg total) by mouth at bedtime.   omeprazole (PRILOSEC) 40 MG capsule, Take 1 capsule (40 mg total) by mouth daily.   polyethylene glycol powder (GLYCOLAX/MIRALAX) 17 GM/SCOOP powder, Take 127.5 g by mouth daily.   senna-docusate (SENOKOT S) 8.6-50 MG tablet, Take 1 tablet by mouth daily.   Tasimelteon (HETLIOZ) 20 MG CAPS, Take 20 mg by mouth at  bedtime.   TRUEplus Lancets 28G MISC, USE AS DIRECTED  Review of Systems:  Review of system negative unless stated in the problem list or HPI.    Physical Exam:  There were no vitals filed for this visit. Physical Exam General: NAD HENT: NCAT Lungs: CTAB, no wheeze, rhonchi or rales.  Cardiovascular: Normal heart sounds, no r/m/g, 2+ pulses in all extremities. No LE edema Abdomen: No TTP, normal bowel sounds MSK: No asymmetry or muscle atrophy.  Skin: no lesions noted on exposed skin Neuro: Alert and oriented x4. CN grossly intact Psych: Normal mood and normal affect   Assessment & Plan:   No problem-specific Assessment & Plan notes found for this encounter.   See Encounters Tab for problem based charting.  Patient Discussed with Dr. {NAMES:[id number]::"Guilloud","Hoffman","Mullen","Narendra","Vincent","Machen","Lau","Hatcher","Williams"} Gwenevere Abbot, MD [facility].  [facility] Internal Medicine Residency, PGY-3   DMII: previous A1c 9.2. Regimen is insulin glargine 20 units and aspart 10 units BID, metformin 1000 mg BID.   HTN: On amlodipine.  Headaches: TTH vs migraines.   HF: lasix 40 mg daily.

## 2023-04-14 NOTE — Progress Notes (Deleted)
11/22/22- 79 yoF former smoker for sleep evaluation    Patient pregnant Medical problem list includes Migraine, HTN, hx PulmonaryEmbolism/ Lovenox4/24/ , Asthma, DM2, Anxiety/ Depression, Endometriosis, FEAnemia,  Epworth score-22 Body weight-215 lbs Aware of snoring. Frequent sleep walking x 5 years. No injuries. Usually gets up, goes to kitchen for water, then back to bed. No ENT surgery. Several family with OSA. Brother on CPAP. Anxiety meds help sleep. Occ AM coffee. Bedtime 1:30-2:30 AM. Up 5-6 times to bathroom, sometimes unaware. Occ palpitation. Ovcc sharp pains in anterior chest wall. Has just learned that she is pregnant again. CTaChest PE 08/12/22->> IR Thrombolytic  IMPRESSION: 1. Marked severity bilateral pulmonary embolism with right heart strain (RV/LV Ratio = 1.31) which has been associated with an increased risk of morbidity and mortality. 2. Mild patchy right upper lobe and left lower lobe infiltrates. 3. Evidence of prior cholecystectomy  04/15/23- 42 yoF former smoker for sleep evaluation      Not pregnant Medical problem list includes Migraine, HTN, hx PulmonaryEmbolism/ Lovenox4/24/ , Asthma, DM2, Anxiety/ Depression, Endometriosis, FEAnemia,  Body weight-215 lbs NPSG 01/06/23- AHI 1.9/hr, desat to 88%, body weight 215 lbs   WNL    ROS-see HPI   Negative unless "+" Constitutional:    weight loss, night sweats, fevers, chills, fatigue, lassitude. HEENT:    headaches, difficulty swallowing, tooth/dental problems, sore throat,       sneezing, itching, ear ache, nasal congestion, post nasal drip, snoring CV:    chest pain, orthopnea, PND, swelling in lower extremities, anasarca,                                   dizziness, palpitations Resp:   shortness of breath with exertion or at rest.                productive cough,   non-productive cough, coughing up of blood.              change in color of mucus.  wheezing.   Skin:    rash or lesions. GI:  No-   heartburn,  indigestion, abdominal pain, nausea, vomiting, diarrhea,                 change in bowel habits, loss of appetite GU: dysuria, change in color of urine, no urgency or frequency.   flank pain. MS:   joint pain, stiffness, decreased range of motion, back pain. Neuro-     nothing unusual Psych:  change in mood or affect.  +depression or +anxiety.   memory loss.  OBJ- Physical Exam General- Alert, Oriented, Affect-appropriate, Distress- none acute, +obese Skin- rash-none, lesions- none, excoriation- none, +tatoos, +stretch marks Lymphadenopathy- none Head- atraumatic            Eyes- Gross vision intact, PERRLA, conjunctivae and secretions clear            Ears- Hearing, canals-normal            Nose- +snorting, no-Septal dev, mucus, polyps, erosion, perforation             Throat- Mallampati III , +tongue stud , drainage- none, tonsils- atrophic, +teeth Neck- flexible , trachea midline, no stridor , thyroid nl, carotid no bruit Chest - symmetrical excursion , unlabored           Heart/CV- RRR , no murmur , no gallop  , no rub, nl s1 s2                           -  JVD- none , edema- none, stasis changes- none, varices- none           Lung- clear to P&A, wheeze- none, cough- none , dullness-none, rub- none           Chest wall-  Abd-  Br/ Gen/ Rectal- Not done, not indicated Extrem- cyanosis- none, clubbing, none, atrophy- none, strength- nl Neuro- grossly intact to observation  .

## 2023-04-15 ENCOUNTER — Ambulatory Visit: Payer: MEDICAID | Admitting: Internal Medicine

## 2023-04-24 NOTE — Progress Notes (Deleted)
 11/22/22- 35 yoF former smoker for sleep evaluation    Patient pregnant ?? Medical problem list includes Migraine, HTN, hx PulmonaryEmbolism/ Lovenox4/24/ , Asthma, DM2, Anxiety/ Depression, Endometriosis, FEAnemia,  Epworth score-22 Body weight-215 lbs Aware of snoring. Frequent sleep walking x 5 years. No injuries. Usually gets up, goes to kitchen for water, then back to bed. No ENT surgery. Several family with OSA. Brother on CPAP. Anxiety meds help sleep. Occ AM coffee. Bedtime 1:30-2:30 AM. Up 5-6 times to bathroom, sometimes unaware. Occ palpitation. Occ sharp pains in anterior chest wall. Has just learned that she is pregnant again. CTaChest PE 08/12/22->> IR Thrombolytic  IMPRESSION: 1. Marked severity bilateral pulmonary embolism with right heart strain (RV/LV Ratio = 1.31) which has been associated with an increased risk of morbidity and mortality. 2. Mild patchy right upper lobe and left lower lobe infiltrates. 3. Evidence of prior cholecystectomy  04/25/23- 42 yoF former smoker followed for sleep issues, complicated by Migraine, HTN, hx PulmonaryEmbolism/ Lovenox4/24/ , Asthma, DM2, Anxiety/ Depression, Endometriosis, FEAnemia, NPSG 01/06/23- AHI 1.9/hr, desat to 88%, body weight 215 lbs     ROS-see HPI   + = positive Constitutional:    weight loss, night sweats, fevers, chills, fatigue, lassitude. HEENT:    headaches, difficulty swallowing, tooth/dental problems, sore throat,       sneezing, itching, ear ache, nasal congestion, post nasal drip, snoring CV:    chest pain, orthopnea, PND, swelling in lower extremities, anasarca,                                   dizziness, palpitations Resp:   shortness of breath with exertion or at rest.                productive cough,   non-productive cough, coughing up of blood.              change in color of mucus.  wheezing.   Skin:    rash or lesions. GI:  No-   heartburn, indigestion, abdominal pain, nausea, vomiting, diarrhea,                  change in bowel habits, loss of appetite GU: dysuria, change in color of urine, no urgency or frequency.   flank pain. MS:   joint pain, stiffness, decreased range of motion, back pain. Neuro-     nothing unusual Psych:  change in mood or affect.  +depression or +anxiety.   memory loss.  OBJ- Physical Exam General- Alert, Oriented, Affect-appropriate, Distress- none acute, +obese Skin- rash-none, lesions- none, excoriation- none, +tatoos, +stretch marks Lymphadenopathy- none Head- atraumatic            Eyes- Gross vision intact, PERRLA, conjunctivae and secretions clear            Ears- Hearing, canals-normal            Nose- +snorting, no-Septal dev, mucus, polyps, erosion, perforation             Throat- Mallampati III , +tongue stud , drainage- none, tonsils- atrophic, +teeth Neck- flexible , trachea midline, no stridor , thyroid  nl, carotid no bruit Chest - symmetrical excursion , unlabored           Heart/CV- RRR , no murmur , no gallop  , no rub, nl s1 s2                           -  JVD- none , edema- none, stasis changes- none, varices- none           Lung- clear to P&A, wheeze- none, cough- none , dullness-none, rub- none           Chest wall-  Abd-  Br/ Gen/ Rectal- Not done, not indicated Extrem- cyanosis- none, clubbing, none, atrophy- none, strength- nl Neuro- grossly intact to observation  .

## 2023-04-25 ENCOUNTER — Encounter: Payer: Self-pay | Admitting: Internal Medicine

## 2023-04-25 ENCOUNTER — Ambulatory Visit: Payer: MEDICAID | Admitting: Internal Medicine

## 2023-04-26 ENCOUNTER — Emergency Department (HOSPITAL_COMMUNITY)
Admission: EM | Admit: 2023-04-26 | Discharge: 2023-04-27 | Discharge status: Against Medical Advice | Payer: MEDICAID | Attending: Emergency Medicine | Admitting: Emergency Medicine

## 2023-04-26 ENCOUNTER — Other Ambulatory Visit: Payer: Self-pay

## 2023-04-26 ENCOUNTER — Emergency Department (HOSPITAL_COMMUNITY): Payer: MEDICAID

## 2023-04-26 ENCOUNTER — Encounter (HOSPITAL_COMMUNITY): Payer: Self-pay

## 2023-04-26 DIAGNOSIS — Z5321 Procedure and treatment not carried out due to patient leaving prior to being seen by health care provider: Secondary | ICD-10-CM | POA: Diagnosis not present

## 2023-04-26 DIAGNOSIS — R0602 Shortness of breath: Secondary | ICD-10-CM | POA: Diagnosis not present

## 2023-04-26 DIAGNOSIS — R0789 Other chest pain: Secondary | ICD-10-CM | POA: Diagnosis not present

## 2023-04-26 DIAGNOSIS — R1030 Lower abdominal pain, unspecified: Secondary | ICD-10-CM | POA: Insufficient documentation

## 2023-04-26 DIAGNOSIS — E119 Type 2 diabetes mellitus without complications: Secondary | ICD-10-CM | POA: Diagnosis not present

## 2023-04-26 DIAGNOSIS — I10 Essential (primary) hypertension: Secondary | ICD-10-CM | POA: Diagnosis not present

## 2023-04-26 LAB — CBC
HCT: 35.7 % — ABNORMAL LOW (ref 36.0–46.0)
Hemoglobin: 11.1 g/dL — ABNORMAL LOW (ref 12.0–15.0)
MCH: 26.7 pg (ref 26.0–34.0)
MCHC: 31.1 g/dL (ref 30.0–36.0)
MCV: 85.8 fL (ref 80.0–100.0)
Platelets: 475 10*3/uL — ABNORMAL HIGH (ref 150–400)
RBC: 4.16 MIL/uL (ref 3.87–5.11)
RDW: 14.1 % (ref 11.5–15.5)
WBC: 7.1 10*3/uL (ref 4.0–10.5)
nRBC: 0 % (ref 0.0–0.2)

## 2023-04-26 LAB — BASIC METABOLIC PANEL
Anion gap: 10 (ref 5–15)
BUN: 5 mg/dL — ABNORMAL LOW (ref 6–20)
CO2: 22 mmol/L (ref 22–32)
Calcium: 8.7 mg/dL — ABNORMAL LOW (ref 8.9–10.3)
Chloride: 103 mmol/L (ref 98–111)
Creatinine, Ser: 0.77 mg/dL (ref 0.44–1.00)
GFR, Estimated: >60 mL/min (ref >60)
Glucose, Bld: 394 mg/dL — ABNORMAL HIGH (ref 70–99)
Potassium: 3.6 mmol/L (ref 3.5–5.1)
Sodium: 135 mmol/L (ref 135–145)

## 2023-04-26 LAB — HCG, SERUM, QUALITATIVE: Preg, Serum: NEGATIVE

## 2023-04-26 LAB — TROPONIN I (HIGH SENSITIVITY)
Troponin I (High Sensitivity): 6 ng/L (ref <18)
Troponin I (High Sensitivity): 3 ng/L (ref <18)

## 2023-04-26 NOTE — ED Triage Notes (Signed)
 Pt c/o L flank pain and chest tightness x 1 week, worsening; took ibuprofen this am with some relief; endorses sob; denies hx of same; hx dm, htn

## 2023-04-26 NOTE — ED Notes (Signed)
 Verbal consent given for MSE

## 2023-04-29 ENCOUNTER — Telehealth: Payer: Self-pay | Admitting: *Deleted

## 2023-04-29 NOTE — Telephone Encounter (Signed)
 I called pt who stated she went to the ER on 04/26/23. Stated labs and tests were done. Stated she stay in the lobby for 8 hrs w/o being seen by a doctor so she left. Pt stated the pain is under her left rib cage ;which comes and goes; and she feels tired. Offered an appt today; stated she cannot come today. She already has an appt on Thursday 1/9 - informed pt to keep this appt which is the first available at this time.

## 2023-04-29 NOTE — Telephone Encounter (Signed)
 Patient is already scheduled to see Dr. Fernand on 01/09.  Right now no sooner appt this week.  ----- Message -----  From: [name]  Sent: 04/26/2023   1:04 AM EST  To: Imp Front Desk Pool  Subject: Appointment Request                              Appointment Request From: Dana Hughes Dave    With Provider: Ozell Nearing [facility] [place]]    Preferred Date Range: 04/30/2023 - 05/02/2023    Preferred Times: Any Time    Reason for visit: Office Visit    Health Maintenance Topic:    Comments:  I believe my spleen is swollen it's been hurting on my left side abdominal area and My back hurts as well and I'm having all symptoms

## 2023-04-30 NOTE — Telephone Encounter (Signed)
 Available appts this afternoon; pt was called. Stated she will wait until her appt on Thursday.

## 2023-05-01 ENCOUNTER — Encounter (HOSPITAL_COMMUNITY): Payer: Self-pay

## 2023-05-01 ENCOUNTER — Observation Stay (HOSPITAL_COMMUNITY)
Admission: EM | Admit: 2023-05-01 | Discharge: 2023-05-02 | Disposition: A | Discharge status: Home / Self Care | Payer: MEDICAID | Attending: Internal Medicine | Admitting: Internal Medicine

## 2023-05-01 ENCOUNTER — Emergency Department (HOSPITAL_COMMUNITY): Payer: MEDICAID

## 2023-05-01 DIAGNOSIS — F411 Generalized anxiety disorder: Secondary | ICD-10-CM | POA: Diagnosis not present

## 2023-05-01 DIAGNOSIS — Z794 Long term (current) use of insulin: Secondary | ICD-10-CM | POA: Diagnosis not present

## 2023-05-01 DIAGNOSIS — E119 Type 2 diabetes mellitus without complications: Secondary | ICD-10-CM

## 2023-05-01 DIAGNOSIS — I110 Hypertensive heart disease with heart failure: Secondary | ICD-10-CM | POA: Insufficient documentation

## 2023-05-01 DIAGNOSIS — I2782 Chronic pulmonary embolism: Principal | ICD-10-CM

## 2023-05-01 DIAGNOSIS — R109 Unspecified abdominal pain: Secondary | ICD-10-CM | POA: Diagnosis not present

## 2023-05-01 DIAGNOSIS — Z20822 Contact with and (suspected) exposure to covid-19: Secondary | ICD-10-CM | POA: Insufficient documentation

## 2023-05-01 DIAGNOSIS — F39 Unspecified mood [affective] disorder: Secondary | ICD-10-CM | POA: Diagnosis not present

## 2023-05-01 DIAGNOSIS — I2699 Other pulmonary embolism without acute cor pulmonale: Secondary | ICD-10-CM | POA: Diagnosis not present

## 2023-05-01 DIAGNOSIS — Z91040 Latex allergy status: Secondary | ICD-10-CM | POA: Insufficient documentation

## 2023-05-01 DIAGNOSIS — D72829 Elevated white blood cell count, unspecified: Secondary | ICD-10-CM | POA: Insufficient documentation

## 2023-05-01 DIAGNOSIS — R059 Cough, unspecified: Secondary | ICD-10-CM | POA: Insufficient documentation

## 2023-05-01 DIAGNOSIS — E876 Hypokalemia: Secondary | ICD-10-CM | POA: Insufficient documentation

## 2023-05-01 DIAGNOSIS — G43909 Migraine, unspecified, not intractable, without status migrainosus: Secondary | ICD-10-CM | POA: Diagnosis not present

## 2023-05-01 DIAGNOSIS — I5020 Unspecified systolic (congestive) heart failure: Secondary | ICD-10-CM | POA: Diagnosis not present

## 2023-05-01 DIAGNOSIS — E1169 Type 2 diabetes mellitus with other specified complication: Secondary | ICD-10-CM

## 2023-05-01 DIAGNOSIS — Z7984 Long term (current) use of oral hypoglycemic drugs: Secondary | ICD-10-CM | POA: Insufficient documentation

## 2023-05-01 DIAGNOSIS — I10 Essential (primary) hypertension: Secondary | ICD-10-CM | POA: Diagnosis present

## 2023-05-01 DIAGNOSIS — Z79899 Other long term (current) drug therapy: Secondary | ICD-10-CM | POA: Insufficient documentation

## 2023-05-01 DIAGNOSIS — R0789 Other chest pain: Secondary | ICD-10-CM | POA: Diagnosis present

## 2023-05-01 LAB — BASIC METABOLIC PANEL
Anion gap: 15 (ref 5–15)
BUN: 8 mg/dL (ref 6–20)
CO2: 18 mmol/L — ABNORMAL LOW (ref 22–32)
Calcium: 9.3 mg/dL (ref 8.9–10.3)
Chloride: 102 mmol/L (ref 98–111)
Creatinine, Ser: 0.82 mg/dL (ref 0.44–1.00)
GFR, Estimated: >60 mL/min (ref >60)
Glucose, Bld: 237 mg/dL — ABNORMAL HIGH (ref 70–99)
Potassium: 3.4 mmol/L — ABNORMAL LOW (ref 3.5–5.1)
Sodium: 135 mmol/L (ref 135–145)

## 2023-05-01 LAB — CBC WITH DIFFERENTIAL/PLATELET
Abs Immature Granulocytes: 0.06 10*3/uL (ref 0.00–0.07)
Basophils Absolute: 0.1 10*3/uL (ref 0.0–0.1)
Basophils Relative: 1 %
Eosinophils Absolute: 0.1 10*3/uL (ref 0.0–0.5)
Eosinophils Relative: 1 %
HCT: 34.3 % — ABNORMAL LOW (ref 36.0–46.0)
Hemoglobin: 11.2 g/dL — ABNORMAL LOW (ref 12.0–15.0)
Immature Granulocytes: 1 %
Lymphocytes Relative: 25 %
Lymphs Abs: 2.9 10*3/uL (ref 0.7–4.0)
MCH: 26.9 pg (ref 26.0–34.0)
MCHC: 32.7 g/dL (ref 30.0–36.0)
MCV: 82.5 fL (ref 80.0–100.0)
Monocytes Absolute: 0.5 10*3/uL (ref 0.1–1.0)
Monocytes Relative: 5 %
Neutro Abs: 8 10*3/uL — ABNORMAL HIGH (ref 1.7–7.7)
Neutrophils Relative %: 67 %
Platelets: 488 10*3/uL — ABNORMAL HIGH (ref 150–400)
RBC: 4.16 MIL/uL (ref 3.87–5.11)
RDW: 14.1 % (ref 11.5–15.5)
WBC: 11.6 10*3/uL — ABNORMAL HIGH (ref 4.0–10.5)
nRBC: 0 % (ref 0.0–0.2)

## 2023-05-01 LAB — RESP PANEL BY RT-PCR (RSV, FLU A&B, COVID)  RVPGX2
Influenza A by PCR: NEGATIVE
Influenza B by PCR: NEGATIVE
Resp Syncytial Virus by PCR: NEGATIVE
SARS Coronavirus 2 by RT PCR: NEGATIVE

## 2023-05-01 LAB — TROPONIN I (HIGH SENSITIVITY)
Troponin I (High Sensitivity): 4 ng/L (ref <18)
Troponin I (High Sensitivity): 3 ng/L (ref <18)

## 2023-05-01 LAB — HCG, SERUM, QUALITATIVE: Preg, Serum: NEGATIVE

## 2023-05-01 LAB — PREGNANCY, URINE: Preg Test, Ur: NEGATIVE

## 2023-05-01 MED ORDER — POTASSIUM CHLORIDE CRYS ER 20 MEQ PO TBCR
40.0000 meq | EXTENDED_RELEASE_TABLET | Freq: Once | ORAL | Status: AC
  Administered 2023-05-02: 40 meq via ORAL
  Filled 2023-05-01: qty 2

## 2023-05-01 MED ORDER — IOHEXOL 350 MG/ML SOLN
75.0000 mL | Freq: Once | INTRAVENOUS | Status: AC | PRN
  Administered 2023-05-01: 75 mL via INTRAVENOUS

## 2023-05-01 MED ORDER — SUMATRIPTAN SUCCINATE 50 MG PO TABS: 50.0000 mg | ORAL_TABLET | ORAL | Status: DC | PRN

## 2023-05-01 MED ORDER — PANTOPRAZOLE SODIUM 40 MG PO TBEC
40.0000 mg | DELAYED_RELEASE_TABLET | Freq: Every day | ORAL | Status: DC
  Administered 2023-05-02: 40 mg via ORAL
  Filled 2023-05-01: qty 1

## 2023-05-01 MED ORDER — ENOXAPARIN SODIUM 100 MG/ML IJ SOSY
100.0000 mg | PREFILLED_SYRINGE | Freq: Two times a day (BID) | INTRAMUSCULAR | Status: DC
  Administered 2023-05-01 – 2023-05-02 (×2): 100 mg via SUBCUTANEOUS
  Filled 2023-05-01 (×3): qty 1

## 2023-05-01 MED ORDER — MIRTAZAPINE 15 MG PO TABS
45.0000 mg | ORAL_TABLET | Freq: Every day | ORAL | Status: DC
  Administered 2023-05-02: 45 mg via ORAL
  Filled 2023-05-01: qty 3

## 2023-05-01 MED ORDER — INSULIN GLARGINE-YFGN 100 UNIT/ML ~~LOC~~ SOLN
20.0000 [IU] | Freq: Every day | SUBCUTANEOUS | Status: DC
  Administered 2023-05-02: 20 [IU] via SUBCUTANEOUS
  Filled 2023-05-01 (×2): qty 0.2

## 2023-05-01 MED ORDER — INSULIN ASPART 100 UNIT/ML IJ SOLN
0.0000 [IU] | Freq: Every day | INTRAMUSCULAR | Status: DC
Start: 2023-05-01 — End: 2023-05-02

## 2023-05-01 MED ORDER — AMLODIPINE BESYLATE 5 MG PO TABS
5.0000 mg | ORAL_TABLET | Freq: Every day | ORAL | Status: DC
  Administered 2023-05-02: 5 mg via ORAL
  Filled 2023-05-01: qty 1

## 2023-05-01 MED ORDER — ARIPIPRAZOLE 10 MG PO TABS
20.0000 mg | ORAL_TABLET | Freq: Every day | ORAL | Status: DC
  Administered 2023-05-02: 20 mg via ORAL
  Filled 2023-05-01: qty 2

## 2023-05-01 MED ORDER — HYDROCODONE-ACETAMINOPHEN 5-325 MG PO TABS
1.0000 | ORAL_TABLET | Freq: Once | ORAL | Status: AC
  Administered 2023-05-01: 1 via ORAL
  Filled 2023-05-01: qty 1

## 2023-05-01 MED ORDER — RAMELTEON 8 MG PO TABS
8.0000 mg | ORAL_TABLET | Freq: Every day | ORAL | Status: DC
  Filled 2023-05-01: qty 1

## 2023-05-01 MED ORDER — ALBUTEROL SULFATE (2.5 MG/3ML) 0.083% IN NEBU: 2.5000 mg | INHALATION_SOLUTION | Freq: Four times a day (QID) | RESPIRATORY_TRACT | Status: DC | PRN

## 2023-05-01 MED ORDER — GABAPENTIN 300 MG PO CAPS
300.0000 mg | ORAL_CAPSULE | Freq: Every day | ORAL | Status: DC
  Administered 2023-05-02: 300 mg via ORAL
  Filled 2023-05-01: qty 1

## 2023-05-01 MED ORDER — POLYETHYLENE GLYCOL 3350 17 G PO PACK: 17.0000 g | PACK | Freq: Every day | ORAL | Status: DC | PRN

## 2023-05-01 MED ORDER — INSULIN ASPART 100 UNIT/ML IJ SOLN
0.0000 [IU] | Freq: Three times a day (TID) | INTRAMUSCULAR | Status: DC
  Administered 2023-05-02: 3 [IU] via SUBCUTANEOUS

## 2023-05-01 MED ORDER — ACETAMINOPHEN 325 MG PO TABS
650.0000 mg | ORAL_TABLET | Freq: Four times a day (QID) | ORAL | Status: DC | PRN
  Administered 2023-05-02: 650 mg via ORAL
  Filled 2023-05-01: qty 2

## 2023-05-01 MED ORDER — LAMOTRIGINE 25 MG PO TABS: 150.0000 mg | ORAL_TABLET | Freq: Every day | ORAL | Status: DC

## 2023-05-01 MED ORDER — ACETAMINOPHEN 650 MG RE SUPP: 650.0000 mg | Freq: Four times a day (QID) | RECTAL | Status: DC | PRN

## 2023-05-01 MED ORDER — MOMETASONE FURO-FORMOTEROL FUM 200-5 MCG/ACT IN AERO
2.0000 | INHALATION_SPRAY | Freq: Two times a day (BID) | RESPIRATORY_TRACT | Status: DC
  Administered 2023-05-02: 2 via RESPIRATORY_TRACT
  Filled 2023-05-01: qty 8.8

## 2023-05-01 NOTE — ED Notes (Signed)
 Hospitalist at bedside

## 2023-05-01 NOTE — ED Notes (Addendum)
 [phone] vascular tech (pager).

## 2023-05-01 NOTE — Progress Notes (Signed)
 PHARMACY - ANTICOAGULATION CONSULT NOTE  Pharmacy Consult for Enoxaparin  Indication: pulmonary embolus  Allergies  Allergen Reactions   Mushroom Extract Complex (Do Not Select) Anaphylaxis   Latex Itching and Swelling   Penicillins Itching    Has patient had a PCN reaction causing immediate rash, facial/tongue/throat swelling, SOB or lightheadedness with hypotension: No Has patient had a PCN reaction causing severe rash involving mucus membranes or skin necrosis: No Has patient had a PCN reaction that required hospitalization No Has patient had a PCN reaction occurring within the last 10 years: No If all of the above answers are NO, then may proceed with Cephalosporin use.     Patient Measurements: Height: 5' 3 (160 cm) Weight: 94.8 kg (209 lb) IBW/kg (Calculated) : 52.4 Heparin  Dosing Weight: 74.3kg  Vital Signs: Temp: 98.6 F (37 C) (01/08 1900) Temp Source: Oral (01/08 1900) BP: 114/99 (01/08 1900) Pulse Rate: 84 (01/08 1900)  Labs: Recent Labs    05/01/23 0722 05/01/23 0903  HGB 11.2*  --   HCT 34.3*  --   PLT 488*  --   CREATININE 0.82  --   TROPONINIHS 4 3    Estimated Creatinine Clearance: 97.9 mL/min (by C-G formula based on SCr of 0.82 mg/dL).   Medical History: Past Medical History:  Diagnosis Date   Anxiety    Arthritis    Asthma    BPPV (benign paroxysmal positional vertigo) 05/18/2022   Bronchitis    BV (bacterial vaginosis)    Candidiasis    Carpal tunnel syndrome, bilateral 01/16/2019   Chest pain of uncertain etiology 07/18/2021   Constipation 12/10/2020   Cystocele without uterine prolapse    Depression    Diabetes mellitus without complication (HCC)    Endometriosis    Exertional dyspnea 06/24/2021   Headache(784.0)    migraines   Hypertension    no longer takes meds   Migraines    Mild concentric left ventricular hypertrophy (LVH) 08/12/2020   Echo (07/2020):  1. Left ventricular ejection fraction, by estimation, is 45 to  50%. The  left ventricle has mildly decreased function. The left ventricle  demonstrates global hypokinesis. There is mild asymmetric left ventricular  hypertrophy of the basal-septal segment.  The interventricular septum is flattened in systole, consistent with right  ventricular pressure overload.   2. Right ve   Muscle spasm    Ovarian cyst    Pelvic pain in female 09/29/2015   Rectal bleeding 12/09/2020   FHx:   Father: colon cancer (68 yo)  Paternal aunt: colon cancer (~14 yo), breast cancer (unknown age).  Paternal gma: Breast cancer   Paternal 2nd cousin: Ovarian cancer     Colonoscopy (07/2020):  Perianal and digital rectal examinations: normal.  Findings:  - A patchy area of moderately nodular mucosa was found at the hepatic flexure and in the ascending colon.  - A patchy area of moderately no   Scoliosis    Thrombocytosis 08/12/2020   Uterine fibroid    Vulvovaginal candidiasis 05/15/2018    Medications:  Scheduled:   enoxaparin  (LOVENOX ) injection  100 mg Subcutaneous Q12H   Infusions:   Assessment: 42YOF w/ history of PE on Eliquis , presenting with chest pain. Found to have pulmonary emboli throughout segmental and subsegmental branches of the RLL. Pharmacy has been consulted to dose enoxaparin .  Hgb 11.2, plt 488. Dosing enoxaparin  Q12H due to BMI > 30. Possible Eliquis  failure if patient has been compliant.  Goal of Therapy:  Monitor platelets by anticoagulation protocol: Yes  Plan:  Start Lovenox  1mg /kg (100 mg) Q12H Daily CBC and s/sx of bleeding F/u oral AC plans  Dana Hughes 05/01/2023,9:59 PM

## 2023-05-01 NOTE — ED Triage Notes (Signed)
 Pt BIB [facility] from home with c/o anxiety and CP. Pt reported she was having anxiety, does not meds for same as they don't help, pt reports had CP and the pain was getting worse. PT started to look up symptoms on internet the difference between anxiety attacks and heart attack which intensify her CP and then started to have left sided neck pain, along with left hand pain and tingling sensation. Pt stated she came in a few days ago for left upper chest pain was concern as she had a lump in there chest and wanted to be evaluated, but left d/t long wait time. Pt appears very anxious and tearful. VSS, A&O x4.

## 2023-05-01 NOTE — Hospital Course (Addendum)
 Summary: Dana Hughes is a 43 y.o. female with past medical history significant for pulmonary embolism, HTN, TIIDM, chest pain, MDD, GAD and HFrEF presenting with shortness of breath, chest pain and dry cough and admitted for pulmonary embolism.  Chronic Pulmonary Embolism Patient had a prior admission on 08/11/2022 for acute unprovoked pulmonary embolism, at which time there were signs of right heart strain on echo which required thrombolysis.  She was discharged on 08/16/2022 on Eliquis .   She then presented with chest pain, shortness of breath, and dyspnea which has been ongoing since her prior admission to the ED and was admitted to the [facility] in the setting of chronic pulmonary emboli. CT angiography revealed ongoing pulmonary emboli in the segmental and subsegmental branches. Lower extremity US  revealed no evidence of deep vein thrombosis seen in the lower extremities, bilaterally. Echo revealed EF of 50 to 55%, RV systolic function is low normal and RV size is normal. Suspected shortness of breath and chest pain were secondary to her PE. Patient's shortness of breath and chest pain improved and oxygen status remained stable with ambulation. Patient discharged on Eliquis  5 mg BID and advised to follow up with PCP.  Leukocytosis Cough Patient presented with slight leukocytosis of 11.6, which decreased to 7.4. Suspect this was secondary to her known PE.  Abdominal Pain Patient presented with epigastric and LUQ pain. Thought to be secondary to possible IBS as patient had also reported episodes of constipation and diarrhea recently with one episode of vomiting prior to admission. She was started on Protonix . These symptoms appeared to resolve during admission.   Type 2 DM Patient hyperglycemic during admission. She was started on her home dose of Semglee  20 units. This can be followed up outpatient.   Hypokalemia  Patient hypokalemic during admission (3.4-3.3),  repleted.  Polypharmacy Patient on several centrally acting medications, which may have contributed to her fatigue.   Mood Disorders Patient was continued on home Abilify  20 mg, Remeron  45 mg nightly, tasimelteon  20 daily, gabapentin  300 mg daily. Patient stated they were out of Lamictal  for some months, so this medication was held.   Migraine headache Home Sumatriptan  50 mg q2 hours PRN daily was continued.

## 2023-05-01 NOTE — H&P (Addendum)
 Date: 05/01/2023               Patient Name:  Dana Hughes MRN: [id number]  DOB: 03-31-1981 Age / Sex: 43 y.o., female   PCP: Arellano Zameza, Priscila, MD         Medical Service: Internal Medicine Teaching Service         Attending Physician: Dr. Rosan Dayton BROCKS, DO      First Contact: Dr. Ozell Riff, MD Pager [phone]    Second Contact: Dr. Ozell Kung, MD Pager [phone]         After Hours (After 5p/  First Contact Pager: [phone]  weekends / holidays): Second Contact Pager: [phone]   SUBJECTIVE   Chief Complaint: Chest pain and shortness of breath.  History of Present Illness: [name] is a 43 year old female with pertinent past medical history of pulmonary embolism, hypertension, type 2 diabetes, chest pain, major depressive disorder, generalized anxiety disorder, and HFrEF.  Patient presented to the ED today for evaluation of her ongoing shortness of breath, chest pain, and dry cough which all started following her acute pulmonary embolism in April 2024.  She states that her chest pain is worse with walking and resolves with rest.  She endorses a recent episode of chest pain which was associated with shortness of breath and radiates to her left shoulder, which prompted evaluation today.  She does endorse feeling a lump on the left side of the chest as well. She does also endorse orthopnea (3 pillows) and exertional dyspnea.  She endorses a great amount of fatigue and is unable to make it to the bathroom or wash dishes without becoming short of breath.  She denies missing any doses of her Eliquis , besides a 1 week period where she did not have her medications 3 months ago.  She endorses some chills and night sweats, but denies fevers.  She does endorse epigastric and left lower quadrant abdominal pain as well.  Denies nausea, did have 1 episode of vomiting yesterday which seemed posttussive in nature.  She does endorse 2 to 3-week history of diarrhea as well as  bouts of diarrhea alternating with constipation.  Denies any rectal bleeding, hematemesis, dysuria, hematuria, frequency, or urgency.  Medications:  Tylenol  500 mg every 6 hours as needed Albuterol  nebulizer Albuterol  inhaler Amlodipine  5 mg daily Eliquis  5 mg twice daily Abilify  20 mg daily  gabapentin  300 mg 3 times daily Advil  800 mg every 8 hours NovoLog  10 units twice daily with meals Lantus  20 units nightly Metformin  1000 mg once daily Remeron  45 mg nightly Dulera  2 puffs daily Omeprazole  40 mg daily MiraLAX  as needed Senokot as needed Imitrex  50 mg every 2 hours as needed for migraines Tasimelteon  20 mg nightly    ED Course: Slightly tachypneic, but otherwise vitals were relatively unremarkable. Leukocytosis to 11.6, improved anemia to 11.2, thrombocytosis to 488. Hypokalemia to 3.4, glucose elevated to 237.  BUN and creatinine normal.  Troponins negative.  Beta-hCG negative.  COVID flu RSV negative.  Chest x-ray with no active disease. CT angio of the chest demonstrating pulmonary emboli in the segmental and subsegmental branches of the right lower branch.  Weblike pulmonary embolism in the distal left lower lobe.  No evidence of right heart strain, right lower lobe groundglass opacities worrisome for infarct.  Air trapping in the bilateral lower lungs worrisome for small airway disease.  Per ED provider, pulmonology was consulted who suggested obtaining DVT studies.  However no sonographer is available to  complete the studies overnight.  Patient admitted for further workup.  Meds:  No outpatient medications have been marked as taking for the 05/01/23 encounter [facility] Encounter).    Past Medical History  Past Surgical History:  Procedure Laterality Date   BIOPSY  08/12/2020   Procedure: BIOPSY;  Surgeon: Shila Gustav GAILS, MD;  Location: [facility];  Service: Endoscopy;;   CHOLECYSTECTOMY     COLONOSCOPY WITH PROPOFOL  N/A 08/12/2020   Procedure: COLONOSCOPY  WITH PROPOFOL ;  Surgeon: Shila Gustav GAILS, MD;  Location: [facility];  Service: Endoscopy;  Laterality: N/A;   ESOPHAGOGASTRODUODENOSCOPY (EGD) WITH PROPOFOL  N/A 08/12/2020   Procedure: ESOPHAGOGASTRODUODENOSCOPY (EGD) WITH PROPOFOL ;  Surgeon: Shila Gustav GAILS, MD;  Location: [facility];  Service: Endoscopy;  Laterality: N/A;   IR ANGIOGRAM PULMONARY BILATERAL SELECTIVE  08/22/2022   IR ANGIOGRAM SELECTIVE EACH ADDITIONAL VESSEL  08/22/2022   IR ANGIOGRAM SELECTIVE EACH ADDITIONAL VESSEL  08/22/2022   IR INFUSION THROMBOL ARTERIAL INITIAL (MS)  08/13/2022   IR THROMB F/U EVAL ART/VEN FINAL DAY (MS)  08/14/2022   IR US  GUIDE VASC ACCESS RIGHT  08/13/2022   POLYPECTOMY  08/12/2020   Procedure: POLYPECTOMY;  Surgeon: Shila Gustav GAILS, MD;  Location: [facility];  Service: Endoscopy;;   TUBAL LIGATION      Social:  Lives With: Father of child and children check (26 and19) at home Support: Family Level of Function: Unable to complete some IADLs such as cooking and cleaning due to fatigue and dyspnea.  PCP: Jackolyn Auer MD Substances: Denies alcohol. Endorses marijuana and tobacco use - 1 blunt daily.   Family History:  Maternal Aunt - blood clots Mother - HTN, CHF? Dad - Colon Cancer 74 y.o. PGM - Lupus   Allergies: Allergies as of 05/01/2023 - Review Complete 05/01/2023  Allergen Reaction Noted   Mushroom extract complex (do not select) Anaphylaxis 06/05/2014   Latex Itching and Swelling 07/07/2017   Penicillins Itching 01/18/2011    Review of Systems: Please see H&P and assessment and plan for pertinent positive and negative.  OBJECTIVE:   Physical Exam: Blood pressure (!) 132/94, pulse 76, temperature 98.7 F (37.1 C), temperature source Oral, resp. rate (!) 22, height 5' 3 (1.6 m), weight 94.8 kg, last menstrual period 04/16/2023, SpO2 97%, unknown if currently breastfeeding.  Constitutional: Chronically ill-appearing in no acute distress Neck: No JVD  appreciated Cardiovascular: regular rate and rhythm, no m/r/g Pulmonary/Chest: Decreased work of breathing on room air, lungs clear to auscultation bilaterally Abdominal: Epigastric and left upper quadrant abdominal tenderness present Extremities: No lower extremity edema or calf tenderness appreciated. Skin: warm and dry Psych: Pleasant affect, linear thought process.  Labs: CBC    Component Value Date/Time   WBC 11.6 (H) 05/01/2023 0722   RBC 4.16 05/01/2023 0722   HGB 11.2 (L) 05/01/2023 0722   HGB 11.5 11/22/2022 1038   HCT 34.3 (L) 05/01/2023 0722   HCT 36.1 11/22/2022 1038   PLT 488 (H) 05/01/2023 0722   PLT 416 11/22/2022 1038   MCV 82.5 05/01/2023 0722   MCV 89 11/22/2022 1038   MCH 26.9 05/01/2023 0722   MCHC 32.7 05/01/2023 0722   RDW 14.1 05/01/2023 0722   RDW 13.6 11/22/2022 1038   LYMPHSABS 2.9 05/01/2023 0722   LYMPHSABS 2.3 08/20/2022 1602   MONOABS 0.5 05/01/2023 0722   EOSABS 0.1 05/01/2023 0722   EOSABS 0.1 08/20/2022 1602   BASOSABS 0.1 05/01/2023 0722   BASOSABS 0.0 08/20/2022 1602     CMP  Component Value Date/Time   NA 135 05/01/2023 0722   NA 145 (H) 11/22/2022 1038   K 3.4 (L) 05/01/2023 0722   CL 102 05/01/2023 0722   CO2 18 (L) 05/01/2023 0722   GLUCOSE 237 (H) 05/01/2023 0722   BUN 8 05/01/2023 0722   BUN 7 11/22/2022 1038   CREATININE 0.82 05/01/2023 0722   CALCIUM  9.3 05/01/2023 0722   PROT 7.2 08/11/2022 2205   ALBUMIN 3.1 (L) 08/11/2022 2205   AST 23 08/11/2022 2205   ALT 30 08/11/2022 2205   ALKPHOS 59 08/11/2022 2205   BILITOT 0.4 08/11/2022 2205   GFRNONAA >60 05/01/2023 0722   GFRAA 103 04/05/2020 1614    Imaging:  EKG: personally reviewed my interpretation is sinus rhythm, low voltage T waves, T wave inversions.  Prior EKG similar.  ASSESSMENT & PLAN:   Assessment & Plan by Problem: Principal Problem:   Chronic pulmonary embolism (HCC) Active Problems:   Hypertension   Diabetes mellitus, type 2 (HCC)    Generalized anxiety disorder   Catelynn KELISHA DALL is a 43 y.o. Ms. Lugenia Assefa is a 43 year old female with pertinent past medical history of pulmonary embolism, hypertension, type 2 diabetes, chest pain, major depressive disorder, generalized anxiety disorder, and HFrEF who presented with SOB and chest pain and is admitted in the setting of chronic pulmonary emboli on hospital day 0  Chest pain Dyspnea Chronic pulmonary emboli Prior mission on 08/11/2022 for acute unprovoked pulmonary embolism, at which time there were signs of right heart strain on echo which required thrombolysis.  She was discharged on 08/16/2022 on Eliquis .  Patient states she has been compliant with this medication aside from one episode of noncompliance about 3 months ago.   Today she presented with chest pain, shortness of breath, and dyspnea which has been ongoing since her prior admission.  She does have worsening of her episodic chest pain which prompted her visit.  CT angiography today did demonstrate ongoing pulmonary emboli in the segmental and subsegmental branches.  Plan: - DVT study per recommendations of pulmonology, according to ED provider. - Echocardiogram for evaluation of right heart - Enoxaparin  per pharmacy - May need alternative anticoagulation outpatient if failing current regiment with Eliquis .  Leukocytosis Cough Unclear etiology of her leukocytosis at this time.  She does endorse ongoing chills and night sweats in combination with leukocytosis may point to infectious etiology.  No obvious source of infection.  May consider something like viral myocarditis/pericarditis as a possible driver of some of her chest pain, though known PE with concern for pulmonary infarction on CT angio would explain these symptoms.   Abdominal pain Unclear etiology, though does seem to be ongoing for some time.  She may have some aspect of IBS with her episodic constipation and diarrhea.  Her epigastric and left upper  quadrant pain may be due to dyspepsia or GERD. Plan: - Protonix  40 mg daily  Type 2 diabetes Hyperglycemic on admission, no signs of DKA. Plan: - Sliding scale insulin  with basal coverage  Hypokalemia Plan: - Mag level -Replete potassium.  Polypharmacy Patient on several centrally acting medications, which may be contributing to her fatigue.   Chronic Conditions: Mood disorders - Continue home Abilify  20, Remeron  45 mg nightly, tasimelteon  20 daily, gabapentin  300 mg daily - Patient states they have been out of Lamictal  for some months, will hold on reinitiating this medication Migraine headache - Sumatriptan  50 mg daily Concern for obstructive lung disease - Albuterol  nebs as needed  Diet: Carb-Modified VTE: Enoxaparin  IVF: None,None Code: Full  Prior to Admission Living Arrangement: Home, living independent Anticipated Discharge Location: Home Barriers to Discharge: Further workup  Dispo: Admit patient to Observation with expected length of stay less than 2 midnights.  Signed: Gabino Boga, MD Internal Medicine Resident PGY-1  05/01/2023, 11:36 PM

## 2023-05-01 NOTE — ED Provider Triage Note (Signed)
 Emergency Medicine Provider Triage Evaluation Note  Dana Hughes , a 43 y.o. female  was evaluated in triage.  Pt complains of left-sided chest pain.  Recently found a lump above her left breast.   Review of Systems  Positive: CP Negative: fever  Physical Exam  BP 134/87   Pulse (!) 106   Temp 98.4 F (36.9 C) (Oral)   Resp (!) 22   Ht 5' 3 (1.6 m)   Wt 94.8 kg   LMP 04/16/2023 (Exact Date)   SpO2 100%   BMI 37.02 kg/m  Gen:   Awake, no distress   Resp:  Normal effort  MSK:   Moves extremities without difficulty  Other:    Medical Decision Making  Medically screening exam initiated at 7:03 AM.  Appropriate orders placed.  Dana Hughes was informed that the remainder of the evaluation will be completed by another provider, this initial triage assessment does not replace that evaluation, and the importance of remaining in the ED until their evaluation is complete.  Cardiac labs EKG CXR   Dana Hughes 05/01/23 9295

## 2023-05-01 NOTE — ED Provider Notes (Signed)
 [facility] Provider Note   CSN: [id number] Arrival date & time: 05/01/23  9342     History  Chief Complaint  Patient presents with   Anxiety  Chest Pain    Dana Hughes is a 43 y.o. female.  43 year old female with past medical history of diabetes, hypertension, and pulmonary embolism in the past well as asthma presents the emergency department today with chest pain.  Patient states that she woke up at about 430 this morning with chest pain.  Mass on left side of her chest.  Radiates to her left arm and jaw.  She states that this lasted for a few hours.  She does report a history of pulmonary embolism in the past.  She is on Eliquis .  She has been taking this as prescribed.  She states that she has been having chest discomfort with coughing.  She reports that over the past few days she has had a dry cough.  She denies any hemoptysis.  Denies any significant leg pain or swelling.  She came to the ER today further evaluation regarding this.        Home Medications Prior to Admission medications   Medication Sig Start Date End Date Taking? Authorizing Provider  acetaminophen  (TYLENOL ) 500 MG tablet Take 2 tablets (1,000 mg total) by mouth every 6 (six) hours as needed for mild pain (or Fever >/= 101). 08/12/20  Yes Christian, [name], MD  albuterol  (PROVENTIL ) (2.5 MG/3ML) 0.083% nebulizer solution Take 3 mLs (2.5 mg total) by nebulization every 6 (six) hours as needed for wheezing or shortness of breath. 07/17/21  Yes Susen Pastor, MD  albuterol  (VENTOLIN  HFA) 108 (90 Base) MCG/ACT inhaler Inhale 2 puffs into the lungs every 6 (six) hours as needed for wheezing or shortness of breath. Reported on 09/29/2015 07/17/21  Yes Susen Pastor, MD  amLODipine  (NORVASC ) 5 MG tablet Take 1 tablet (5 mg total) by mouth daily. 11/06/22 11/06/23 Yes Masters, Katie, DO  ARIPiprazole  (ABILIFY ) 20 MG tablet Take 1 tablet (20 mg total) by mouth  daily. Patient taking differently: Take 20 mg by mouth at bedtime. 06/27/22  Yes Harl Regan E, NP  gabapentin  (NEURONTIN ) 300 MG capsule Take 1 capsule (300 mg total) by mouth 3 (three) times daily. Patient taking differently: Take 300 mg by mouth 2 (two) times daily. 06/27/22  Yes Harl Regan BRAVO, NP  insulin  aspart (NOVOLOG ) 100 UNIT/ML FlexPen Inject 10 Units into the skin 2 (two) times daily with a meal. 06/12/22  Yes Susen Pastor, MD  insulin  glargine (LANTUS  SOLOSTAR) 100 UNIT/ML Solostar Pen Inject 20 Units into the skin daily at 10pm. 08/17/22  Yes Susen Pastor, MD  metFORMIN  (GLUCOPHAGE ) 1000 MG tablet Take 1 tablet (1,000 mg total) by mouth 2 (two) times daily with a meal. Patient taking differently: Take 1,000 mg by mouth daily with breakfast. 06/12/22  Yes Susen Pastor, MD  mirtazapine  (REMERON ) 45 MG tablet Take 1 tablet (45 mg total) by mouth at bedtime. 06/27/22  Yes Harl Regan E, NP  mometasone -formoterol  (DULERA ) 200-5 MCG/ACT AERO Inhale 2 puffs into the lungs 2 (two) times daily. 08/16/22  Yes Elnora Ip, MD  omeprazole  (PRILOSEC) 40 MG capsule Take 1 capsule (40 mg total) by mouth daily. 02/09/22  Yes Lynwood Lenis, PA-C  Tasimelteon  (HETLIOZ ) 20 MG CAPS Take 20 mg by mouth at bedtime. 06/27/22  Yes Harl Regan E, NP  apixaban  (ELIQUIS ) 5 MG TABS tablet Take 1 tablet (5 mg total) by  mouth 2 (two) times daily. 05/02/23 07/31/23  Gregary Sharper, MD  furosemide  (LASIX ) 20 MG tablet Take 2 tablets (40 mg total) by mouth daily. Patient not taking: Reported on 05/01/2023 11/06/22 11/06/23  Masters, Izetta, DO  lamoTRIgine  (LAMICTAL ) 150 MG tablet Take 1 tablet (150 mg total) by mouth daily. Patient not taking: Reported on 05/01/2023 06/27/22   Harl Zane BRAVO, NP      Allergies    Mushroom extract complex (do not select), Latex, Penicillins, and Wound dressing adhesive    Review of Systems   Review of Systems  Respiratory:  Positive for cough.    Cardiovascular:  Positive for chest pain.  All other systems reviewed and are negative.   Physical Exam Updated Vital Signs BP (!) 149/93 (BP Location: Left Arm)   Pulse 62   Temp (!) 97.5 F (36.4 C) (Oral)   Resp 20   Ht 5' 3 (1.6 m)   Wt 94.8 kg   LMP 04/16/2023 (Exact Date)   SpO2 100%   BMI 37.02 kg/m  Physical Exam Vitals and nursing note reviewed.   Gen: NAD Eyes: PERRL, EOMI HEENT: no oropharyngeal swelling Neck: trachea midline Resp: clear to auscultation bilaterally Card: RRR, no murmurs, rubs, or gallops Abd: nontender, nondistended Extremities: no calf tenderness, no edema Vascular: 2+ radial pulses bilaterally, 2+ DP pulses bilaterally Neuro: no focal deficits Skin: no rashes Psyc: acting appropriately   ED Results / Procedures / Treatments   Labs (all labs ordered are listed, but only abnormal results are displayed) Labs Reviewed  CBC WITH DIFFERENTIAL/PLATELET - Abnormal; Notable for the following components:      Result Value   WBC 11.6 (*)    Hemoglobin 11.2 (*)    HCT 34.3 (*)    Platelets 488 (*)    Neutro Abs 8.0 (*)    All other components within normal limits  BASIC METABOLIC PANEL - Abnormal; Notable for the following components:   Potassium 3.4 (*)    CO2 18 (*)    Glucose, Bld 237 (*)    All other components within normal limits  CBC - Abnormal; Notable for the following components:   Hemoglobin 10.4 (*)    HCT 33.7 (*)    Platelets 445 (*)    All other components within normal limits  BASIC METABOLIC PANEL - Abnormal; Notable for the following components:   Sodium 134 (*)    Potassium 3.3 (*)    Glucose, Bld 273 (*)    BUN 5 (*)    Calcium  8.5 (*)    All other components within normal limits  GLUCOSE, CAPILLARY - Abnormal; Notable for the following components:   Glucose-Capillary 205 (*)    All other components within normal limits  CBG MONITORING, ED - Abnormal; Notable for the following components:   Glucose-Capillary  202 (*)    All other components within normal limits  RESP PANEL BY RT-PCR (RSV, FLU A&B, COVID)  RVPGX2  HCG, SERUM, QUALITATIVE  PREGNANCY, URINE  TROPONIN I (HIGH SENSITIVITY)  TROPONIN I (HIGH SENSITIVITY)    EKG None  Radiology No results found.   Procedures Procedures    Medications Ordered in ED Medications  HYDROcodone -acetaminophen  (NORCO/VICODIN) 5-325 MG per tablet 1 tablet (1 tablet Oral Given 05/01/23 1915)  iohexol  (OMNIPAQUE ) 350 MG/ML injection 75 mL (75 mLs Intravenous Contrast Given 05/01/23 1735)  potassium chloride  SA (KLOR-CON  M) CR tablet 40 mEq (40 mEq Oral Given 05/02/23 0122)    ED Course/ Medical Decision Making/ A&P  Medical Decision Making 43 year old female with past medical history of diabetes, hypertension, and pulmonary embolism on Eliquis  presenting to the emergency department today with left-sided chest pain.  I will further evaluate the patient with basic labs Wels and EKG, chest x-ray, troponin for the rash for ACS, pulmonary edema, pulmonary infiltrates, or pneumothorax.  If her initial workup is unrevealing will consider further workup for pulmonary embolism as the patient was tachycardic here on arrival.  The patient's EKG interpreted by me shows a sinus rhythm with normal axis, normal intervals, and nonspecific ST-T changes with some T wave inversions in the lateral leads.  These do appear more pronounced than previous EKGs.  The patient's initial troponins were negative.  Chest x-ray is unrevealing.  CT angiogram is ordered to evaluate for worsening pulmonary embolism as well as pneumonia missed on x-ray.  RSV/COVID/flu swab is ordered as well.  The patient was found to have a PE and is admitted for further management.  Amount and/or Complexity of Data Reviewed Labs: ordered. Radiology: ordered.  Risk Prescription drug management. Decision regarding hospitalization.           Final Clinical  Impression(s) / ED Diagnoses Final diagnoses:  Pulmonary embolism, unspecified chronicity, unspecified pulmonary embolism type, unspecified whether acute cor pulmonale present (HCC)  Other chronic pulmonary embolism without acute cor pulmonale (HCC)    Rx / DC Orders ED Discharge Orders          Ordered    apixaban  (ELIQUIS ) 5 MG TABS tablet  2 times daily       Note to Pharmacy: combined with RX #[id number] (so only #60 plus 1 refill)   05/02/23 1520    Ambulatory referral to Hematology / Oncology       Comments: Bilateral PE, hypercoagulable workup.   05/02/23 1512    Increase activity slowly        05/02/23 1701    Diet - low sodium heart healthy        05/02/23 1701    Discharge instructions       Comments: You were hospitalized for pulmonary embolisms. You were given blood thinners and tests and imaging were completed. Your DVT studies showed no clots in your legs. Your echocardiogram did not show any acute abnormalities. You are medically stable for discharge and outpatient follow up. Thank you for allowing us  to be part of your care.   We arranged for you to follow up at: the [facility] on January 22nd at 9:15am.   Please note these changes made to your medications:   I have sent a refill of Eliquis  for you. Please be sure to take this medication twice daily to prevent futher blood clots.   You may continue taking your other medications as prescribed.   If you have any worsening shortness of breath, chest pain, lightheadedness/dizziness, please return to the emergency department for evaluation.   Please call our clinic if you have any questions or concerns, we may be able to help and keep you from a long and expensive emergency room wait. Our clinic and after hours phone number is [phone], the best time to call is Monday through Friday 9 am to 4 pm but there is always someone available 24/7 if you have an emergency. If you need medication refills  please notify your pharmacy one week in advance and they will send us  a request.   05/02/23 1701    Call MD for:  severe uncontrolled pain  05/02/23 1701    Call MD for:  difficulty breathing, headache or visual disturbances        05/02/23 1701    Call MD for:  persistant dizziness or light-headedness        05/02/23 1701    Call MD for:  extreme fatigue        05/02/23 1701              Ula Prentice SAUNDERS, MD 05/04/23 1502

## 2023-05-02 ENCOUNTER — Encounter: Payer: MEDICAID | Admitting: Internal Medicine

## 2023-05-02 ENCOUNTER — Observation Stay (HOSPITAL_BASED_OUTPATIENT_CLINIC_OR_DEPARTMENT_OTHER): Payer: MEDICAID

## 2023-05-02 ENCOUNTER — Other Ambulatory Visit (HOSPITAL_COMMUNITY): Payer: Self-pay

## 2023-05-02 ENCOUNTER — Observation Stay (HOSPITAL_COMMUNITY): Payer: MEDICAID

## 2023-05-02 ENCOUNTER — Other Ambulatory Visit: Payer: Self-pay

## 2023-05-02 DIAGNOSIS — I10 Essential (primary) hypertension: Secondary | ICD-10-CM | POA: Diagnosis not present

## 2023-05-02 DIAGNOSIS — R609 Edema, unspecified: Secondary | ICD-10-CM

## 2023-05-02 DIAGNOSIS — I2782 Chronic pulmonary embolism: Secondary | ICD-10-CM | POA: Diagnosis not present

## 2023-05-02 DIAGNOSIS — E1169 Type 2 diabetes mellitus with other specified complication: Secondary | ICD-10-CM | POA: Diagnosis not present

## 2023-05-02 LAB — CBC
HCT: 33.7 % — ABNORMAL LOW (ref 36.0–46.0)
Hemoglobin: 10.4 g/dL — ABNORMAL LOW (ref 12.0–15.0)
MCH: 26.5 pg (ref 26.0–34.0)
MCHC: 30.9 g/dL (ref 30.0–36.0)
MCV: 85.8 fL (ref 80.0–100.0)
Platelets: 445 10*3/uL — ABNORMAL HIGH (ref 150–400)
RBC: 3.93 MIL/uL (ref 3.87–5.11)
RDW: 14.4 % (ref 11.5–15.5)
WBC: 7.4 10*3/uL (ref 4.0–10.5)
nRBC: 0 % (ref 0.0–0.2)

## 2023-05-02 LAB — GLUCOSE, CAPILLARY: Glucose-Capillary: 205 mg/dL — ABNORMAL HIGH (ref 70–99)

## 2023-05-02 LAB — ECHOCARDIOGRAM COMPLETE
AR max vel: 2.58 cm2
AV Area VTI: 2.89 cm2
AV Area mean vel: 2.51 cm2
AV Mean grad: 3 mm[Hg]
AV Peak grad: 6.5 mm[Hg]
Ao pk vel: 1.27 m/s
Area-P 1/2: 3.99 cm2
Calc EF: 52.6 %
Height: 63 in
MV VTI: 3.19 cm2
S' Lateral: 3.6 cm
Single Plane A2C EF: 53.7 %
Single Plane A4C EF: 44.7 %
Weight: 3344.01 [oz_av]

## 2023-05-02 LAB — BASIC METABOLIC PANEL
Anion gap: 9 (ref 5–15)
BUN: 5 mg/dL — ABNORMAL LOW (ref 6–20)
CO2: 22 mmol/L (ref 22–32)
Calcium: 8.5 mg/dL — ABNORMAL LOW (ref 8.9–10.3)
Chloride: 103 mmol/L (ref 98–111)
Creatinine, Ser: 0.7 mg/dL (ref 0.44–1.00)
GFR, Estimated: >60 mL/min (ref >60)
Glucose, Bld: 273 mg/dL — ABNORMAL HIGH (ref 70–99)
Potassium: 3.3 mmol/L — ABNORMAL LOW (ref 3.5–5.1)
Sodium: 134 mmol/L — ABNORMAL LOW (ref 135–145)

## 2023-05-02 LAB — CBG MONITORING, ED: Glucose-Capillary: 202 mg/dL — ABNORMAL HIGH (ref 70–99)

## 2023-05-02 MED ORDER — APIXABAN 5 MG PO TABS
5.0000 mg | ORAL_TABLET | Freq: Two times a day (BID) | ORAL | 2 refills | Status: DC
  Filled 2023-05-02: qty 60, 30d supply, fill #0
  Filled 2023-05-31 – 2023-08-05 (×3): qty 60, 30d supply, fill #1
  Filled 2023-10-22: qty 60, 30d supply, fill #2

## 2023-05-02 NOTE — Progress Notes (Signed)
 All discharge instructions discussed with patient including appointments. Home medications Xarelto picked from [facility] and given to patient. All belongings with patient. Patient has no questions or concerns at this time. [employer] called for patient

## 2023-05-02 NOTE — ED Notes (Signed)
PT transported to [place].

## 2023-05-02 NOTE — Progress Notes (Signed)
 Lower extremity venous duplex completed. Please see CV Procedures for preliminary results.  Shona Simpson, RVT 05/02/23 9:51 AM

## 2023-05-02 NOTE — Plan of Care (Signed)

## 2023-05-02 NOTE — Discharge Instructions (Signed)
 Low Income Public Housing  [facility] [employer]ning [employer] Supported Living Apts [phone] [address]. [address] Mull[address]., [facility] [phone]  [facility] [address], [place] [phone]  [facility] [address] [place] [phone]  [facility] [address] [place]. [place] [phone]  North[address] Apts. [address] [place] [phone]  Parks[place] Apts.  [address], [place] [phone]  [place] Gavel Homes [address], [place] [phone]  [facility] [address] [place] [phone]  [facility] [address], [place] [phone]  THP Apts. [address]  [facility] [phone]  [facility]  [address] [place] [phone]  [place] Apts.  [address] [place] [phone]   [place] Apts. II [address] [place] [phone]  [facility] [address] [place] [phone] [address], [place] [phone]  [facility] Apts. [address], [place] [phone]  [place] Apts. [address] [place] [phone]  [facility] Apts. [address] [place] [phone]  Rockwell [employer]ation Apts.  [address], [place] [phone]  [facility] Apts. [address], [place] [phone] Apts.  [address] [phone]  [facility] Apts. [address] [place] [phone]  [place] Apts.  [address] [facility] [phone]    [facility] assistance programs. If you are behind on your bills and expenses, and need some help to make it through a short term hardship or financial emergency, there are several organizations and charities in the [place] and [place] area that may be able to help. They range from the Pathmark Stores, Dollar General, Landscape Architect of [place]  and the local community action agency, the Intel, Avnet. These groups may be able to provide you resources to help pay your utility bills, rent, and they even offer housing assistance.  Crisis assistance program Find help for paying your rent, electric bills, free food, and even funds to pay your mortgage. The Liberty Global [phone]) offers several services to local families, as funding allows. The Emergency Assistance Program (EAP), which they administer, provides household goods, free food, clothing, and financial aid to people in need in the [place]  area. The EAP program does have some qualification, and counselors will interview clients for financial assistance by written referral only. Referrals need to be made by the Department of Social Services or by other EAP approved human services agencies or charities in the area.  Money for resources for emergency assistance are available for security deposits for rent, water, electric, and gas, past due rent, utility bills, past due mortgage payments, food, and clothing. The Liberty Global also operates a programme researcher, broadcasting/film/video on the site. More Liberty Global.  Open Door Ministries of Colgate-palmolive, which can be reached at [phone], offers emergency assistance programs for those in need of help, such as food, rent assistance, a soup kitchen, shelter, and clothing. They are based in [facility]  but provide a number of services to those that qualify for assistance. Continue with Open Door Ministries programs.  [facility] Department of Social Services may be able to offer temporary financial assistance and cash grants for paying rent and utilities. Help may be provided for local county residents who may be experiencing personal crisis when other resources, including government programs, are not available. Call [phone]  St.  Jerrell everitt Mt Society,  which is based in [place], provides financial assistance of up to $50.00 to help pay for rent, utilities, cooling bills, rent, and prescription medications. The program also provides secondhand furniture to those in need. [phone]  Mattel is a Geneticist, Molecular. The organization can offer emergency assistance for paying rent, electric bills, utilities, food, household products and furniture. They offer extensive emergency and transitional housing for families, children and single women, and also run a Boy's and Dole Food. 301 Thrift Shops, Cms Energy Corporation, and other aid offered too. [address], [place], [place]  [address], [phone]  Additional locations of the Pathmark Stores are in [place] and other nearby communities. When you have an emergency, need free food, money for basic needs, or just need assistance around Christmas, then the Pathmark Stores may have the resources you need. Or they can refer you to nearby agencies. Learn more.  [place] Low Income Risk Manager - This is offered for [facility] families. The federal government created Cit Group Program provides a one-time cash grant payment to help eligible low-income families pay their electric and heating bills. [address], [place], [place]  [address], [phone]  Government and Motorola - The county administers several emergency and self-sufficiency programs. Residents of [place] can get help with energy bills and food, rent, and other expenses. In addition, work with a sports coach who may be able to help you find a job or improve your employment skills. More [place] public assistance.  [place] Emergency Assistance - A program offers emergency utility and rent funds for greater [place] area residents. The program can also provide counseling and referrals to charities and government  programs. Also provides food and a free meal program that serves lunch Mondays - Saturdays and dinner seven days per week to individuals in the community. [address] [place], [place]  [address], [phone]  Parker [employer]in - Offers affordable apartment and housing communities across [place] and [place]. The low income and seniors can access public housing, rental assistance to qualified applicants, and apply for the section 8 rent subsidy program. Other programs include Chiropractor and Engineer, Maintenance. [address], [place], [place]  [address], dial [phone].  Basic needs such as clothing - Low income families can receive free items (school supplies, clothes, holiday assistance, etc.) from clothing closets while more moderate income [address] families can shop at caremark rx. Locations across the area help the needy. Get information on [facility]  free clothing centers.  The [facility]) provides transitional housing to veterans and the disabled. Clients will also access other services too, including life skills classes, case management, and assistance in finding permanent housing. [address], [place], [place]  [address], call [phone]  [place] Transitional Housing in [place] is for people who were just evicted or that are formerly homeless. The non-profit will also help then gain self-sufficiency, find a home or apartment to live in, and also provides information on rent assistance when needed. Dial [phone]  [employer] Partnership to End Homelessness is available in [place]. Families that were evicted or that are homeless can gain shelter, food, clothing, furniture, and also emergency financial assistance. Other services include financial skills and life skills coaching, job training, and case management. [address], [place],  [place] [address]. Telephone [phone].  The Dynegy, Avnet. runs the Ford Motor Company. This can  help people save money on their heating and summer cooling bills, and is free to low income families. Free upgrades can be made to your home. Phone [phone]  Many of the non-profits and programs mentioned above are all inclusive, meaning they can meet many needs of the low income, such as energy bills, food, rent, and more. However there are several organizations that focus just on rent and housing. Read more on rent assistance in [place] region.  Legal assistance for evictions, foreclosure, and more If you need free legal advice on civili issues, such as foreclosures, evictions, electronics engineer, government programs, domestic issues and more, Landscape Architect of [place]  [facility]) is a associate professor firm that provides free legal services and counsel to lower income people, seniors, disabled, and others. The goal is to ensure everyone has access to justice and fair representation.  Call them at [phone], or click here to learn more about [place]  free legal assistance programs.  [place] Avnet and funds for emergency expenses The Pathmark Stores is another organization that can provide people with deere & company and funds to pay bills. Their assistance depends on funding, and the demand for help is always very high. They can provide cash to help pay rent, a missed mortgage payment, or gas, electric, and water bills. But the assistance doesn't stop there. They also have a food pantry on site, which can provide food once every three (3) months to people who need help. The Keycorp can also offer a engineering geologist once every three (3) months for a maximum three (3) times. After receiving this voucher over that period of time, applicants can receive this aid one every six (6) months after that. [phone].  Kohl's action  agency The Intel, Avnet. offers job and dispensing optician. Resources are focused on helping students obtain the skills and experiences that are necessary to compete in today's challenging and tight job market. The non-profit faith-based community action agency offers internship trainings as well as classroom instruction. Economically disadvantaged and challenged individuals and potential employers can use their services. Classes are tailored to meet the needs of people in the [place] region. [place], [place] [address], [phone]    Foreclosure prevention services Housing Counseling and Education is also offered by Meadwestvaco of the [facility]. The agency (phone number is below) is a Engineer, Structural providing foreclosure advice and counseling. They offer mortgage resolution counseling and also reverse mortgage counseling. Counselors can direct people to both kimberly-clark, as well as [place]  foreclosure assistance options.  Warehouse Manager has locations in [place] and [place]. They run debt and foreclosure prevention programs for local families. A sampling of the programs offered include both Budget and Housing Counseling. This includes money management, financial advice, budget review and development of a written action plan with a pensions consultant to help solve specific individual financial problems. In addition, housing and mortgage counselors can also provide pre- and post-purchase homeownership counseling, default resolution counseling (to prevent foreclosure) and reverse mortgage counseling. A Debt Management Program allows people and families with a high level of credit card or medical debt to consolidate and repay consumer debt and loans to creditors and rebuild positive credit ratings and scores. [phone]  Debt  assistance programs Receive free counseling and debt help from [facility] of the [facility]. The [place] based agency can be reached at [phone]  [phone]. The counselors provide free help, and the services include budget counseling. This will help people manage their expenses and set goals. They also offer a Forensic Scientist, which will help individuals consolidate their debts and become debt free. Most of the workshops and services are free.  Community clinics in [place] of the leading health and dental centers are listed below. They may be able to provide medication, physicals, dental care, and general family care to residents of all incomes and backgrounds across the region. Some of the programs focus on the low income and underinsured. However if these clinics can't meet your needs, find information and details on more clinics in [place] .  Some of the options include Marriott of [employer]. This center provides free or low cost health care to low-income adults 18 - 64, who have no health insurance. Among other services offered include a pharmacy and eye clinic. Phone [phone]  [facility], which is located in [place], is a community clinic that provides primary medical and health care to uninsured and underinsured adults and families, as well as the low income, in the greater [place] area on a sliding-fee scale. Call [phone]  [place] Adult Dental Program - They run a dental assistance program that is organized by [facility] to provide dental services and aid to Sempra energy. Services offered by the dental clinic are limited to extractions, pain management, and minor restorative care. [phone]  [place] [facility] has locations in [facility] and [place]. The community clinics provide complete pediatric care including primary health, mental health, social work, neurology,  cardiology, asthma. Dial [phone].  In addition to those Antonito and Safeway Inc, find other free community clinics in [place]  and across the county.  Food pantry and assistance Some of the local food pantries and distribution centers to call for free food and groceries include The Hive of [place] (phone [phone]), The [facility] (phone [phone]) and also Ppl Corporation. Dial [phone].  Several other food banks in the region provide clothing, free food and meals, access to soup kitchens and other help. Find the addresses and phone numbers of more food pantries in [place]. http://www.needhelppayingbills.com/html/[place]_assistance_pro.html    SUNDAYS BREAKFAST TWO LOCATIONS: 8:00am served in Nucor Corporation by Awaken Ppl Corporation 8:30am SHUTTLE provided from [facility], served at Apache Corporation, [address] LUNCH TWO LOCATIONS [plus one additional third Sunday only] 10:30am - 12:30pm served at Ecolab, Liberty Global, [place] [address] (1.2 miles from [facility]) 12:30pm served in [place] by Land O'lakes Team (THIRD Sunday only) 1:30pm served at [facility] by [facility] one location [plus one additional third Sunday only] 5:00pm Every Sunday, served under the bridge at [address] by Dovie Under the 3m Company (.7 miles from [facility]) (THIRD Sunday ONLY) 4:00pm served in the parking garage, across from Nucor Corporation, corner of [place] and [place] by Ryland Group Works Ministries MONDAYS BREAKFAST 7:30am served in Nucor Corporation by the United States Steel Corporation and Friends LUNCH 10:30am - 12:30pm served at Ecolab, Liberty Global, [place] [address] (1.2 miles from [facility]) DINNER TWO LOCATIONS: 7:00pm served in front of the courthouse at the corner of Goldman Sachs and Campbell Soup. by  [place] Monday Night Meal (3 blocks from [facility]) 4:30pm served  at the Autonation, [address] by Bank Of [place] Company (0.6 miles from [facility]) [place] BREAKFAST 8:00am - 9:00am served at The Tjx Companies, [address] (0.3 miles from [place]) LUNCH 10:30am - 12:30pm served at the Ecolab, Liberty Global [address] (1.2 miles from [place]) DINNER 6:00pm served at Csx Corporation, enter from Capital One and go to the Sonic Automotive, (0.7 miles from [place]) The [facility] BREAKFAST 7:00am - 8:00am served at Ecolab, Liberty Global [address], (1.2 miles from [place]) LUNCH ONE LOCATION [plus two additional locations listed below] 10:30am - 12:30pm served at Ecolab, Liberty Global [address] (1.2 miles from [place]) (FIRST Wednesday ONLY) 11:30am served at Dillard's, [place] [address] (6.6 miles from [place]) (SECOND Wednesday ONLY) 11:00am served at [place]. Garnette Ava Blackwood of [address], [address] (1.3 miles from [place]) [place] TWO LOCATIONS 6:00pm served at W. R. Berkley, [place] W. Visteon Corporation. (1.3 miles from [facility]) 4:00pm - 6:00pm (hot dogs and chips) served at Levi Strauss of [facility], [address] (1.7 miles from [place]) [place] BREAKFAST NOT AVAILABLE AT THIS TIME LUNCH 10:30am - 12:30pm served at Ecolab, Liberty Global, [place] [address] (1.2 miles from [place]) DINNER 6:00pm served at Csx Corporation, enter from Capital One and go to the Sonic Automotive, (0.7 miles from Nucor Corporation) [place] BREAKFAST NOT AVAILABLE AT THIS TIME LUNCH 10:30am - 12:30pm served at Ecolab, Liberty Global [address] (1.2 miles from [place]) DINNER TWO LOCATIONS, [plus one additional first Friday only] 6:00pm served under the bridge at [address] by Dovie Under Csx Corporation. (.7 miles from [facility]) 5:00pm - 7:00pm served at Levi Strauss of [facility], [address] (1.7 miles from The Silos) (FIRST Friday ONLY) 5:45 pm - SHUTTLE provided from the LIBRARY at 5:45pm. Served at [facility], [address]. SATURDAYS BREAKFAST TWO LOCATIONS [plus one additional last Saturday only] 8:00am served at [facility] by Delphi 8:30am served at Pulte Homes, [address] (2.2 miles from [facility]) (LAST Saturday ONLY) 8:30am served at Beazer Homes, [address] (5 miles from [place]) LUNCH 10:30am - 12:30pm served at Ecolab, Liberty Global [address] (1.2 miles from [facility]) DINNER 6:00pm served under the bridge at [address] by World Fuel Services Corporation (0.7 miles from Nucor Corporation)  DIRECTIONS FROM [place] TO ALL MEAL LOCATIONS The Bridge at [address] (.7 miles from [address]) [address] on [place]. Turn Right onto Directv 433 ft. Continue onto [address] under bridge, about 500 ft. Courthouse (3 blocks from [facility]) South on [address]. Turn right on [place]  1 block to Ppl Corporation (.5 miles from [place]) [place] on [place]. Yrc Worldwide. past Brink's Company to Emcor. Enter from Capital One and go to the Affiliated Computer Services building W. R. Berkley [address] Visteon Corporation. (1.3 miles from [facility]) [address] on [place]. Turn Right onto [address]. Jama Cassis. church will  be on the Left. The Tjx Companies [address] (.3 miles from [facility]) Go .3 miles on W. Friendly Destination is on your right Monsanto Company at Oneok (6.6 miles from [address]) [address] on [place] toward W Friendly Turn right onto W Friendly Continue onto Alcoa Inc. Continue onto Toll Brothers. 5. Tommi is on right SANMINA-SCI Futures Trader) [address] (.6 miles from [place]) [place] on [place]. [address] Turn Left onto [address] 0.3 miles Destination is on the Left. [place] Black & Decker at American Express (5 miles from Nucor Corporation) 1. Head south on [address]. Turn right onto W Friendly Turn slightly left onto Quest Diagnostics Continue onto Quest Diagnostics Turn right at Barnes & Noble Continue to church on right New Birth Sounds of [facility] [address] (1.7 miles from [place]) [address] on [place] 1.4 miles [place] becomes [place]. El[address] Continue 0.6 miles and church will be on theright. North[place] Guardian Life Insurance at [address] (2.5 miles from Nucor Corporation) [place] provided from Massachusetts Mutual Life Park] [place] on [place]. Elm toward Estée Lauder right onto Costco Wholesale left onto Emerson Electric Turn left onto Micron Technology [address] (2.2 miles from [address]) [address] on [place] 1.4 miles [place] becomes [place]. Elm [address] Turn right onto [address]  [address] and church will be on the Left. Potter's House/Armada At&t [address] (1.2 miles from [facility]) 1.Turn right onto [facility] 2.Turn left onto GEANNIE Beagle 3.Micheline MICAEL Ruth 4.Destination is on your right [place]. Garnette Ava Tommi of [address] at Toysrus (1.3 miles from [facility]) [address] on [address] Turn left onto Genuine Parts right onto S. Maryellen Cassis. Continue onto Kb Home [place]. Turn left onto Smurfit-stone Container. Turn right onto Wellpoint.   The First American Shelters The United Way's "S3741234" is a great source of information about community services available.  Access  by dialing 2-1-1 from anywhere in [place] , or by website -  pooledincome.pl.  [facility]) now has a full time staff to take referrals for all individuals needing shelter or housing placement. They do not do direct services or have beds, but are in charge of assessing and coordinating placement for individuals needing shelter. The phone number for coordinated entry is [phone].    Other Armed Forces Technical Officer Number and Address  [facility] Housing for homeless and needy men with substance abuse issues 5 day Covid Quarantine [phone] [address] [place], [place]  Goldman Sachs of [place] Emergency assistance for General mills only Ingram Micro Inc [phone] Ext. 104 [place], [place]  Clara Brunswick Corporation of the [facility] Domestic violence shelter for women and their children [phone] [place], [place]  [facility] Domestic violence shelter for women and their children Each family gets their own unit and can quarantine after admission. [phone] [place], [place]  Interactive Resource Center [facility]) / Resources for the Cigna center for the homeless Information and referral to housing resources Counseling Showers Laundry Barbershop Phone bank Mailroom Computer lab Medical clinic Bike maintenance center 14 Day covid quarantine [phone] [address]  [address] [place], [place]  Open Golden West Financial -  [place] [facility] Emergency housing Food Emergency financial assistance Permanent supportive housing [phone] [address] [place], [place]  The Pathmark Stores Crisis assistance Medication Housing Food Utility assistance [phone] [address] [place], [place]   [phone] [address] [place], [place]  The Monsanto Company of [place]       Transitional housing Case Risk Manager assistance [phone] [address] [place], [place]  [facility], Pitney Bowes for adult men and women Can admit with MD clearance from the hospital after positive covid test.  Intake Hotline [phone] [address] [place], [place]  24-hour Crisis Line for those Facing Homelessness   Information and referral to community resources [phone]  Graybar Electric and additional resources. Can admit with MD clearance after positive covid test.  Prefer online applications (blocked on [facility] computers)/ currently full. Will be able to do intake at the office starting in May.  [phone] Admin only location

## 2023-05-02 NOTE — TOC Initial Note (Signed)
 Transition of Care (TOC) - Initial/Assessment Note   Spoke to patient at bedside. Confirmed face sheet information. States active with PCP.   Patient lives with  my baby father and two boys ages 32 and 53 .   Documented last admission she received rollator and 3 in 1 . Patient states she got the rollator but never received the 3 in 1.    Transition of Care Department [facility]) has reviewed patient and no TOC needs have been identified at this time. We will continue to monitor patient advancement through interdisciplinary progression rounds. If new patient transition needs arise, please place a TOC consult.    Patient Details  Name: Dana Hughes MRN: [id number] Date of Birth: Sep 08, 1980  Transition of Care [facility]) CM/SW Contact:    Stephane Alexandro Jansky, RN Phone Number: 05/02/2023, 3:53 PM  Clinical Narrative:                   Expected Discharge Plan: Home/Self Care Barriers to Discharge: Continued Medical Work up   Patient Goals and CMS Choice Patient states their goals for this hospitalization and ongoing recovery are:: to return to home   Choice offered to / list presented to : NA      Expected Discharge Plan and Services   Discharge Planning Services: CM Consult Post Acute Care Choice: NA Living arrangements for the past 2 months: Apartment                 DME Arranged: N/A DME Agency: NA       HH Arranged: NA HH Agency: NA        Prior Living Arrangements/Services Living arrangements for the past 2 months: Apartment Lives with::  (baby father and two boys ages 63 and 3) Patient language and need for interpreter reviewed:: Yes Do you feel safe going back to the place where you live?: Yes      Need for Family Participation in Patient Care: Yes (Comment) Care giver support system in place?: Yes (comment) Current home services: DME Criminal Activity/Legal Involvement Pertinent to Current Situation/Hospitalization: No - Comment as needed  Activities of Daily  Living   ADL Screening (condition at time of admission) Independently performs ADLs?: No Does the patient have a NEW difficulty with bathing/dressing/toileting/self-feeding that is expected to last >3 days?: No Does the patient have a NEW difficulty with getting in/out of bed, walking, or climbing stairs that is expected to last >3 days?: No Does the patient have a NEW difficulty with communication that is expected to last >3 days?: No Is the patient deaf or have difficulty hearing?: No Does the patient have difficulty seeing, even when wearing glasses/contacts?: No Does the patient have difficulty concentrating, remembering, or making decisions?: No  Permission Sought/Granted   Permission granted to share information with : No              Emotional Assessment Appearance:: Appears stated age Attitude/Demeanor/Rapport: Engaged Affect (typically observed): Accepting Orientation: : Oriented to Self, Oriented to Place, Oriented to  Time, Oriented to Situation Alcohol / Substance Use: Not Applicable Psych Involvement: No (comment)  Admission diagnosis:  Chronic pulmonary embolism (HCC) [I27.82] Pulmonary embolism, unspecified chronicity, unspecified pulmonary embolism type, unspecified whether acute cor pulmonale present [facility]) [I26.99] Patient Active Problem List   Diagnosis Date Noted   Chronic pulmonary embolism (HCC) 05/01/2023   Headache 03/14/2023   Snoring 11/22/2022   Pregnancy 11/22/2022   Lower extremity edema 11/06/2022   Asthma 09/10/2022   Chest pain  09/06/2022   Healthcare maintenance 06/12/2022   Uterine fibroid    Homicidal ideation 06/24/2021   Marijuana dependence (HCC) 04/13/2021   PTSD (post-traumatic stress disorder) 01/19/2021   Bipolar 2 disorder, major depressive episode (HCC) 01/11/2021   Complex ovarian cyst 12/10/2020   Atypical migraine 12/09/2020   HFrEF (heart failure with reduced ejection fraction) (HCC) 08/12/2020   History of colonic polyps  08/12/2020   History of pulmonary embolism 08/08/2020   Fibroids 05/05/2020   Hematochezia 04/08/2020   Iron  deficiency anemia 04/08/2020   Severe episode of recurrent major depressive disorder, 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 [place] [address] Phone: [phone] Fax: [phone]  [facility] [address] [place] [address] Phone: [phone] Fax: [phone]  [facility] [address] [place] [address] Phone: [phone] Fax: [phone]  [facility] [address] [place] [address] Phone: [phone] Fax: [phone]     Social Drivers of Health (SDOH) Social History: SDOH Screenings   Food Insecurity: Food Insecurity Present (05/02/2023)  Housing: High Risk (05/02/2023)  Transportation Needs: No Transportation Needs (05/02/2023)  Utilities: Not At Risk (05/02/2023)  Depression (PHQ2-9): High Risk (03/14/2023)  Financial Resource Strain: Medium Risk (11/08/2021)  Social Connections: Moderately Isolated (08/20/2022)  Tobacco Use: High Risk (05/01/2023)   SDOH Interventions:     Readmission Risk Interventions    08/12/2020   12:56 PM  Readmission Risk Prevention Plan  Transportation Screening Complete  PCP or Specialist Appt within 3-5 Days Complete  HRI or Home Care Consult --  Social Work Consult for Recovery Care Planning/Counseling Complete  Palliative Care Screening Not Applicable  Medication Review Oceanographer) Referral to Pharmacy

## 2023-05-02 NOTE — ED Notes (Addendum)
 ED TO INPATIENT HANDOFF REPORT  ED Nurse Name and Phone #: Delon RAMAN Name/Age/Gender Dana Hughes Moons 43 y.o. female Room/Bed: 037C/037C  Code Status   Code Status: Full Code  Home/SNF/Other Home Patient oriented to: self, place, time, and situation Is this baseline? Yes   Triage Complete: Triage complete  Chief Complaint Chronic pulmonary embolism (HCC) [I27.82]  Triage Note Pt BIB [facility] from home with c/o anxiety and CP. Pt reported she was having anxiety, does not meds for same as they don't help, pt reports had CP and the pain was getting worse. PT started to look up symptoms on internet the difference between anxiety attacks and heart attack which intensify her CP and then started to have left sided neck pain, along with left hand pain and tingling sensation. Pt stated she came in a few days ago for left upper chest pain was concern as she had a lump in there chest and wanted to be evaluated, but left d/t long wait time. Pt appears very anxious and tearful. VSS, A&O x4.    Allergies Allergies  Allergen Reactions   Mushroom Extract Complex (Do Not Select) Anaphylaxis   Latex Itching and Swelling   Penicillins Itching    Has patient had a PCN reaction causing immediate rash, facial/tongue/throat swelling, SOB or lightheadedness with hypotension: No Has patient had a PCN reaction causing severe rash involving mucus membranes or skin necrosis: No Has patient had a PCN reaction that required hospitalization No Has patient had a PCN reaction occurring within the last 10 years: No If all of the above answers are NO, then may proceed with Cephalosporin use.    Wound Dressing Adhesive Itching    Level of Care/Admitting Diagnosis ED Disposition     ED Disposition  Admit   Condition  --   Comment  Hospital [facility] [[id number]]  Level of Care: Med-Surg [16]  May place patient in observation at [facility] or [facility] if equivalent level of  care is available:: No  Covid Evaluation: Confirmed COVID Negative  Diagnosis: Chronic pulmonary embolism (HCC) [416.2.ICD-9-CM]  Admitting Physician: ROSAN DAYTON JAYSON LORINE  Attending Physician: ROSAN DAYTON JAYSON [[id number]]          B Medical/Surgery History Past Medical History:  Diagnosis Date   Anxiety    Arthritis    Asthma    BPPV (benign paroxysmal positional vertigo) 05/18/2022   Bronchitis    BV (bacterial vaginosis)    Candidiasis    Carpal tunnel syndrome, bilateral 01/16/2019   Chest pain of uncertain etiology 07/18/2021   Constipation 12/10/2020   Cystocele without uterine prolapse    Depression    Diabetes mellitus without complication (HCC)    Endometriosis    Exertional dyspnea 06/24/2021   Headache(784.0)    migraines   Hypertension    no longer takes meds   Migraines    Mild concentric left ventricular hypertrophy (LVH) 08/12/2020   Echo (07/2020):  1. Left ventricular ejection fraction, by estimation, is 45 to 50%. The  left ventricle has mildly decreased function. The left ventricle  demonstrates global hypokinesis. There is mild asymmetric left ventricular  hypertrophy of the basal-septal segment.  The interventricular septum is flattened in systole, consistent with right  ventricular pressure overload.   2. Right ve   Muscle spasm    Ovarian cyst    Pelvic pain in female 09/29/2015   Rectal bleeding 12/09/2020   FHx:   Father: colon cancer (78 yo)  Paternal aunt: colon cancer (~24 yo), breast cancer (unknown age).  Paternal gma: Breast cancer   Paternal 2nd cousin: Ovarian cancer     Colonoscopy (07/2020):  Perianal and digital rectal examinations: normal.  Findings:  - A patchy area of moderately nodular mucosa was found at the hepatic flexure and in the ascending colon.  - A patchy area of moderately no   Scoliosis    Thrombocytosis 08/12/2020   Uterine fibroid    Vulvovaginal candidiasis 05/15/2018   Past Surgical History:  Procedure Laterality  Date   BIOPSY  08/12/2020   Procedure: BIOPSY;  Surgeon: Shila Gustav GAILS, MD;  Location: [facility];  Service: Endoscopy;;   CHOLECYSTECTOMY     COLONOSCOPY WITH PROPOFOL  N/A 08/12/2020   Procedure: COLONOSCOPY WITH PROPOFOL ;  Surgeon: Shila Gustav GAILS, MD;  Location: [facility];  Service: Endoscopy;  Laterality: N/A;   ESOPHAGOGASTRODUODENOSCOPY (EGD) WITH PROPOFOL  N/A 08/12/2020   Procedure: ESOPHAGOGASTRODUODENOSCOPY (EGD) WITH PROPOFOL ;  Surgeon: Shila Gustav GAILS, MD;  Location: [facility]COPY;  Service: Endoscopy;  Laterality: N/A;   IR ANGIOGRAM PULMONARY BILATERAL SELECTIVE  08/22/2022   IR ANGIOGRAM SELECTIVE EACH ADDITIONAL VESSEL  08/22/2022   IR ANGIOGRAM SELECTIVE EACH ADDITIONAL VESSEL  08/22/2022   IR INFUSION THROMBOL ARTERIAL INITIAL (MS)  08/13/2022   IR THROMB F/U EVAL ART/VEN FINAL DAY (MS)  08/14/2022   IR US  GUIDE VASC ACCESS RIGHT  08/13/2022   POLYPECTOMY  08/12/2020   Procedure: POLYPECTOMY;  Surgeon: Shila Gustav GAILS, MD;  Location: [facility];  Service: Endoscopy;;   TUBAL LIGATION       A IV Location/Drains/Wounds Patient Lines/Drains/Airways Status     Active Line/Drains/Airways     Name Placement date Placement time Site Days   Peripheral IV 05/01/23 20 G Left;Posterior Hand 05/01/23  0654  Hand  1   Peripheral IV 05/01/23 20 G Right Antecubital 05/01/23  1710  Antecubital  1   Wound / Incision (Open or Dehisced) 08/12/22 Irritant Dermatitis (Moisture Associated Skin Damage) Abdomen Right;Upper;Left;Medial 08/12/22  2230  Abdomen  263            Intake/Output Last 24 hours No intake or output data in the 24 hours ending 05/02/23 1136  Labs/Imaging Results for orders placed or performed during the hospital encounter of 05/01/23 (from the past 48 hours)  CBC with Differential     Status: Abnormal   Collection Time: 05/01/23  7:22 AM  Result Value Ref Range   WBC 11.6 (H) 4.0 - 10.5 K/uL   RBC 4.16 3.87 - 5.11 MIL/uL   Hemoglobin 11.2 (L)  12.0 - 15.0 g/dL   HCT 65.6 (L) 63.9 - 53.9 %   MCV 82.5 80.0 - 100.0 fL   MCH 26.9 26.0 - 34.0 pg   MCHC 32.7 30.0 - 36.0 g/dL   RDW 85.8 88.4 - 84.4 %   Platelets 488 (H) 150 - 400 K/uL   nRBC 0.0 0.0 - 0.2 %   Neutrophils Relative % 67 %   Neutro Abs 8.0 (H) 1.7 - 7.7 K/uL   Lymphocytes Relative 25 %   Lymphs Abs 2.9 0.7 - 4.0 K/uL   Monocytes Relative 5 %   Monocytes Absolute 0.5 0.1 - 1.0 K/uL   Eosinophils Relative 1 %   Eosinophils Absolute 0.1 0.0 - 0.5 K/uL   Basophils Relative 1 %   Basophils Absolute 0.1 0.0 - 0.1 K/uL   Immature Granulocytes 1 %   Abs Immature Granulocytes 0.06 0.00 - 0.07 K/uL  Comment: Performed at [facility], [address] [place], [place] [address]  Basic metabolic panel     Status: Abnormal   Collection Time: 05/01/23  7:22 AM  Result Value Ref Range   Sodium 135 135 - 145 mmol/L   Potassium 3.4 (L) 3.5 - 5.1 mmol/L   Chloride 102 98 - 111 mmol/L   CO2 18 (L) 22 - 32 mmol/L   Glucose, Bld 237 (H) 70 - 99 mg/dL    Comment: Glucose reference range applies only to samples taken after fasting for at least 8 hours.   BUN 8 6 - 20 mg/dL   Creatinine, Ser 9.17 0.44 - 1.00 mg/dL   Calcium  9.3 8.9 - 10.3 mg/dL   GFR, Estimated >39 >39 mL/min    Comment: (NOTE) Calculated using the CKD-EPI Creatinine Equation (2021)    Anion gap 15 5 - 15    Comment: Performed at [facility], [address], [place], [place] [address]  Troponin I (High Sensitivity)     Status: None   Collection Time: 05/01/23  7:22 AM  Result Value Ref Range   Troponin I (High Sensitivity) 4 <18 ng/L    Comment: (NOTE) Elevated high sensitivity troponin I (hsTnI) values and significant  changes across serial measurements may suggest ACS but many other  chronic and acute conditions are known to elevate hsTnI results.  Refer to the Links section for chest pain algorithms and additional  guidance. Performed at [facility], [address],  [place], [place] [address]   hCG, serum, qualitative     Status: None   Collection Time: 05/01/23  7:22 AM  Result Value Ref Range   Preg, Serum NEGATIVE NEGATIVE    Comment:        THE SENSITIVITY OF THIS METHODOLOGY IS >10 mIU/mL. Performed at [facility], [address] [place], [place] [address]   Troponin I (High Sensitivity)     Status: None   Collection Time: 05/01/23  9:03 AM  Result Value Ref Range   Troponin I (High Sensitivity) 3 <18 ng/L    Comment: (NOTE) Elevated high sensitivity troponin I (hsTnI) values and significant  changes across serial measurements may suggest ACS but many other  chronic and acute conditions are known to elevate hsTnI results.  Refer to the Links section for chest pain algorithms and additional  guidance. Performed at [facility], [address], [place], [place] [address]   Resp panel by RT-PCR (RSV, Flu A&B, Covid) Anterior Nasal Swab     Status: None   Collection Time: 05/01/23  3:34 PM   Specimen: Anterior Nasal Swab  Result Value Ref Range   SARS Coronavirus 2 by RT PCR NEGATIVE NEGATIVE   Influenza A by PCR NEGATIVE NEGATIVE   Influenza B by PCR NEGATIVE NEGATIVE    Comment: (NOTE) The Xpert Xpress SARS-CoV-2/FLU/RSV plus assay is intended as an aid in the diagnosis of influenza from Nasopharyngeal swab specimens and should not be used as a sole basis for treatment. Nasal washings and aspirates are unacceptable for Xpert Xpress SARS-CoV-2/FLU/RSV testing.  Fact Sheet for Patients: bloggercourse.com  Fact Sheet for Healthcare Providers: seriousbroker.it  This test is not yet approved or cleared by the [place]  FDA and has been authorized for detection and/or diagnosis of SARS-CoV-2 by FDA under an Emergency Use Authorization (EUA). This EUA will remain in effect (meaning this test can be used) for the duration of the COVID-19  declaration under Section 564(b)(1)  of the Act, 21 U.S.C. section 360bbb-3(b)(1), unless the authorization is terminated or revoked.     Resp Syncytial Virus by PCR NEGATIVE NEGATIVE    Comment: (NOTE) Fact Sheet for Patients: bloggercourse.com  Fact Sheet for Healthcare Providers: seriousbroker.it  This test is not yet approved or cleared by the [place]  FDA and has been authorized for detection and/or diagnosis of SARS-CoV-2 by FDA under an Emergency Use Authorization (EUA). This EUA will remain in effect (meaning this test can be used) for the duration of the COVID-19 declaration under Section 564(b)(1) of the Act, 21 U.S.C. section 360bbb-3(b)(1), unless the authorization is terminated or revoked.  Performed at [facility], [address], [place], [place] [address]   Pregnancy, urine     Status: None   Collection Time: 05/01/23  4:30 PM  Result Value Ref Range   Preg Test, Ur NEGATIVE NEGATIVE    Comment:        THE SENSITIVITY OF THIS METHODOLOGY IS >25 mIU/mL. Performed at [facility], [address], [place], [place] [address]   CBC     Status: Abnormal   Collection Time: 05/02/23  5:13 AM  Result Value Ref Range   WBC 7.4 4.0 - 10.5 K/uL   RBC 3.93 3.87 - 5.11 MIL/uL   Hemoglobin 10.4 (L) 12.0 - 15.0 g/dL   HCT 66.2 (L) 63.9 - 53.9 %   MCV 85.8 80.0 - 100.0 fL   MCH 26.5 26.0 - 34.0 pg   MCHC 30.9 30.0 - 36.0 g/dL   RDW 85.5 88.4 - 84.4 %   Platelets 445 (H) 150 - 400 K/uL   nRBC 0.0 0.0 - 0.2 %    Comment: Performed at [facility], [address], [place], [place] [address]  Basic metabolic panel     Status: Abnormal   Collection Time: 05/02/23  5:13 AM  Result Value Ref Range   Sodium 134 (L) 135 - 145 mmol/L   Potassium 3.3 (L) 3.5 - 5.1 mmol/L   Chloride 103 98 - 111 mmol/L   CO2 22 22 - 32 mmol/L   Glucose, Bld 273 (H) 70 - 99 mg/dL    Comment: Glucose reference range applies only to samples taken after  fasting for at least 8 hours.   BUN 5 (L) 6 - 20 mg/dL   Creatinine, Ser 9.29 0.44 - 1.00 mg/dL   Calcium  8.5 (L) 8.9 - 10.3 mg/dL   GFR, Estimated >39 >39 mL/min    Comment: (NOTE) Calculated using the CKD-EPI Creatinine Equation (2021)    Anion gap 9 5 - 15    Comment: Performed at [facility], [address], [place], [place] [address]  CBG monitoring, ED     Status: Abnormal   Collection Time: 05/02/23  7:45 AM  Result Value Ref Range   Glucose-Capillary 202 (H) 70 - 99 mg/dL    Comment: Glucose reference range applies only to samples taken after fasting for at least 8 hours.   CT Angio Chest PE W and/or Wo Contrast Addendum Date: 05/01/2023 ADDENDUM REPORT: 05/01/2023 18:48 ADDENDUM: These results were called by telephone at the time of interpretation on 05/01/2023 at 6:46 pm to provider ANDREW TEE , who verbally acknowledged these results. Electronically Signed   By: Greig Pique M.D.   On: 05/01/2023 18:48   Result Date: 05/01/2023 CLINICAL DATA:  High probability for PE. EXAM: CT ANGIOGRAPHY CHEST WITH CONTRAST TECHNIQUE:  Multidetector CT imaging of the chest was performed using the standard protocol during bolus administration of intravenous contrast. Multiplanar CT image reconstructions and MIPs were obtained to evaluate the vascular anatomy. RADIATION DOSE REDUCTION: This exam was performed according to the departmental dose-optimization program which includes automated exposure control, adjustment of the mA and/or kV according to patient size and/or use of iterative reconstruction technique. CONTRAST:  75mL OMNIPAQUE  IOHEXOL  350 MG/ML SOLN COMPARISON:  CT angiogram chest 08/12/2022 FINDINGS: Cardiovascular: Pulmonary emboli are seen throughout segmental and subsegmental branches of the right lower lobe. Web-like pulmonary embolism seen in the distal left lower lobe pulmonary artery. Heart is mildly enlarged. Aorta is normal in size. There is no pericardial effusion.  Mediastinum/Nodes: No enlarged mediastinal, hilar, or axillary lymph nodes. Thyroid  gland, trachea, and esophagus demonstrate no significant findings. Lungs/Pleura: There are minimal patchy airspace and ground-glass opacities in the right lower lobe. Additionally, there are areas of air trapping in the bilateral lower lungs. There is no pleural effusion or pneumothorax. Upper Abdomen: No acute abnormality. Musculoskeletal: No chest wall abnormality. No acute or significant osseous findings. Review of the MIP images confirms the above findings. IMPRESSION: 1. Pulmonary emboli are seen throughout segmental and subsegmental branches of the right lower lobe. Web-like pulmonary embolism in the distal left lower lobe pulmonary artery. No evidence for right heart strain. 2. Minimal patchy airspace and ground-glass opacities in the right lower lobe worrisome for infarct. 3. Air trapping in the bilateral lower lungs worrisome for small airways disease. Electronically Signed: By: Greig Pique M.D. On: 05/01/2023 18:41   DG Chest Portable 1 View Result Date: 05/01/2023 CLINICAL DATA:  Chest pain. EXAM: PORTABLE CHEST 1 VIEW COMPARISON:  April 26, 2023. FINDINGS: The heart size and mediastinal contours are within normal limits. Both lungs are clear. The visualized skeletal structures are unremarkable. IMPRESSION: No active disease. Electronically Signed   By: Lynwood Landy Raddle M.D.   On: 05/01/2023 08:21    Pending Labs Unresulted Labs (From admission, onward)     Start     Ordered   05/02/23 0500  CBC  Daily,   R      05/01/23 2205   05/01/23 2308  Magnesium   Once,   R        05/01/23 2307            Vitals/Pain Today's Vitals   05/02/23 0637 05/02/23 0752 05/02/23 0753 05/02/23 1107  BP: (!) 138/92  (!) 154/98 (!) 147/93  Pulse: 72  68 70  Resp: 16  16 (!) 21  Temp:   98 F (36.7 C) (!) 97.5 F (36.4 C)  TempSrc:   Oral Oral  SpO2: 99%  99% 100%  Weight:      Height:      PainSc:  0-No pain       Isolation Precautions No active isolations  Medications Medications  enoxaparin  (LOVENOX ) injection 100 mg (100 mg Subcutaneous Given 05/02/23 1057)  acetaminophen  (TYLENOL ) tablet 650 mg (has no administration in time range)    Or  acetaminophen  (TYLENOL ) suppository 650 mg (has no administration in time range)  polyethylene glycol (MIRALAX  / GLYCOLAX ) packet 17 g (has no administration in time range)  albuterol  (PROVENTIL ) (2.5 MG/3ML) 0.083% nebulizer solution 2.5 mg (has no administration in time range)  amLODipine  (NORVASC ) tablet 5 mg (5 mg Oral Given 05/02/23 1057)  ARIPiprazole  (ABILIFY ) tablet 20 mg (20 mg Oral Given 05/02/23 0122)  gabapentin  (NEURONTIN ) capsule 300 mg (300 mg Oral Given 05/02/23  0122)  insulin  glargine-yfgn (SEMGLEE ) injection 20 Units (20 Units Subcutaneous Given 05/02/23 0120)  mirtazapine  (REMERON ) tablet 45 mg (45 mg Oral Given 05/02/23 0121)  mometasone -formoterol  (DULERA ) 200-5 MCG/ACT inhaler 2 puff (2 puffs Inhalation Given 05/02/23 1057)  pantoprazole  (PROTONIX ) EC tablet 40 mg (40 mg Oral Given 05/02/23 1058)  SUMAtriptan  (IMITREX ) tablet 50 mg (has no administration in time range)  ramelteon  (ROZEREM ) tablet 8 mg (has no administration in time range)  insulin  aspart (novoLOG ) injection 0-9 Units ( Subcutaneous Not Given 05/02/23 1019)  insulin  aspart (novoLOG ) injection 0-5 Units ( Subcutaneous Not Given 05/02/23 0243)  HYDROcodone -acetaminophen  (NORCO/VICODIN) 5-325 MG per tablet 1 tablet (1 tablet Oral Given 05/01/23 1915)  iohexol  (OMNIPAQUE ) 350 MG/ML injection 75 mL (75 mLs Intravenous Contrast Given 05/01/23 1735)  potassium chloride  SA (KLOR-CON  M) CR tablet 40 mEq (40 mEq Oral Given 05/02/23 0122)    Mobility walks     Focused Assessments Cardiac Assessment Handoff:  Cardiac Rhythm: Normal sinus rhythm No results found for: CKTOTAL, CKMB, CKMBINDEX, TROPONINI Lab Results  Component Value Date   DDIMER 2.21 (H) 08/11/2022   Does the Patient  currently have chest pain? no   R Recommendations: See Admitting Provider Note  Report given to:   Additional Notes:

## 2023-05-02 NOTE — Discharge Summary (Addendum)
 Name: Dana Hughes MRN: [id number] DOB: July 03, 1980 43 y.o. PCP: Arellano Zameza, Priscila, MD  Date of Admission: 05/01/2023  6:57 AM Date of Discharge:  05/02/2023 Attending Physician: Dr. CHARLENA Eastern  DISCHARGE DIAGNOSIS:  Primary Problem: Chronic pulmonary embolism [facility])   Hospital Problems: Principal Problem:   Chronic pulmonary embolism (HCC) Active Problems:   Hypertension   Diabetes mellitus, type 2 (HCC)   Generalized anxiety disorder   DISCHARGE MEDICATIONS:   Allergies as of 05/02/2023       Reactions   Mushroom Extract Complex (do Not Select) Anaphylaxis   Latex Itching, Swelling   Penicillins Itching   Has patient had a PCN reaction causing immediate rash, facial/tongue/throat swelling, SOB or lightheadedness with hypotension: No Has patient had a PCN reaction causing severe rash involving mucus membranes or skin necrosis: No Has patient had a PCN reaction that required hospitalization No Has patient had a PCN reaction occurring within the last 10 years: No If all of the above answers are NO, then may proceed with Cephalosporin use.   Wound Dressing Adhesive Itching        Medication List     TAKE these medications    acetaminophen  500 MG tablet Commonly known as: TYLENOL  Take 2 tablets (1,000 mg total) by mouth every 6 (six) hours as needed for mild pain (or Fever >/= 101).   albuterol  (2.5 MG/3ML) 0.083% nebulizer solution Commonly known as: PROVENTIL  Take 3 mLs (2.5 mg total) by nebulization every 6 (six) hours as needed for wheezing or shortness of breath.   albuterol  108 (90 Base) MCG/ACT inhaler Commonly known as: VENTOLIN  HFA Inhale 2 puffs into the lungs every 6 (six) hours as needed for wheezing or shortness of breath. Reported on 09/29/2015   amLODipine  5 MG tablet Commonly known as: NORVASC  Take 1 tablet (5 mg total) by mouth daily.   ARIPiprazole  20 MG tablet Commonly known as: ABILIFY  Take 1 tablet (20 mg total) by mouth daily. What  changed: when to take this   Dulera  200-5 MCG/ACT Aero Generic drug: mometasone -formoterol  Inhale 2 puffs into the lungs 2 (two) times daily.   Eliquis  5 MG Tabs tablet Generic drug: apixaban  Take 1 tablet (5 mg total) by mouth 2 (two) times daily.   furosemide  20 MG tablet Commonly known as: Lasix  Take 2 tablets (40 mg total) by mouth daily.   gabapentin  300 MG capsule Commonly known as: Neurontin  Take 1 capsule (300 mg total) by mouth 3 (three) times daily. What changed: when to take this   insulin  aspart 100 UNIT/ML FlexPen Commonly known as: NOVOLOG  Inject 10 Units into the skin 2 (two) times daily with a meal.   lamoTRIgine  150 MG tablet Commonly known as: LaMICtal  Take 1 tablet (150 mg total) by mouth daily.   Lantus  SoloStar 100 UNIT/ML Solostar Pen Generic drug: insulin  glargine Inject 20 Units into the skin daily at 10pm.   metFORMIN  1000 MG tablet Commonly known as: GLUCOPHAGE  Take 1 tablet (1,000 mg total) by mouth 2 (two) times daily with a meal. What changed: when to take this   mirtazapine  45 MG tablet Commonly known as: REMERON  Take 1 tablet (45 mg total) by mouth at bedtime.   omeprazole  40 MG capsule Commonly known as: PRILOSEC Take 1 capsule (40 mg total) by mouth daily.   Tasimelteon  20 MG Caps Commonly known as: Hetlioz  Take 20 mg by mouth at bedtime.       DISPOSITION AND FOLLOW-UP:  Ms.Darion ELONA YINGER was discharged from  [facility] in [place] condition. At the hospital follow up visit please address:  Pulmonary embolisms: admitted for concerns of acute on chronic pulmonary embolisms, but medically stable with mild symptoms, ambulating well on room air, and no evidence of heart strain. She has been relatively adherent to eliquis  but missed two weeks recently. We have discharged her with her home dose of eliquis  5 mg BID. Please ensure she has plenty of refills and is taking it regularly. Recommend outpatient workup for  hypercoagulability, especially given her history of miscarriage. Ensure she follows up with our referral to hematology.   Follow-up Appointments:  Follow-up Information     Tobie Gaines, DO Follow up on 05/15/2023.   Specialty: Internal Medicine Why: Please go to a hospital follow up appointment on 05/15/2023 at 9:15 AM Contact information: [address] [place] [address] [phone]                 HOSPITAL COURSE:  Patient Summary: Summary: Dana Hughes is a 43 y.o. female with past medical history significant for pulmonary embolism, HTN, TIIDM, chest pain, MDD, GAD and HFrEF presenting with shortness of breath, chest pain and dry cough and admitted for pulmonary embolism.  Chronic Pulmonary Embolism Patient had a prior admission on 08/11/2022 for acute unprovoked pulmonary embolism, at which time there were signs of right heart strain on echo which required thrombolysis.  She was discharged on 08/16/2022 on Eliquis .   She then presented with chest pain, shortness of breath, and dyspnea which has been ongoing since her prior admission to the ED and was admitted to the IMTS in the setting of chronic pulmonary emboli. CT angiography revealed ongoing pulmonary emboli in the segmental and subsegmental branches. Lower extremity US  revealed no evidence of deep vein thrombosis seen in the lower extremities, bilaterally. Echo revealed EF of 50 to 55%, RV systolic function is low normal and RV size is normal. Suspected shortness of breath and chest pain were secondary to her PE. Patient's shortness of breath and chest pain improved and oxygen status remained stable with ambulation. Patient discharged on Eliquis  5 mg BID and advised to follow up with PCP.  Leukocytosis Cough Patient presented with slight leukocytosis of 11.6, which decreased to 7.4. Suspect this was secondary to her known PE.  Abdominal Pain Patient presented with epigastric and LUQ pain. Thought to be secondary to  possible IBS as patient had also reported episodes of constipation and diarrhea recently with one episode of vomiting prior to admission. She was started on Protonix . These symptoms appeared to resolve during admission.   Type 2 DM Patient hyperglycemic during admission. She was started on her home dose of Semglee  20 units. This can be followed up outpatient.   Hypokalemia  Patient hypokalemic during admission (3.4-3.3), repleted.  Polypharmacy Patient on several centrally acting medications, which may have contributed to her fatigue.   Mood Disorders Patient was continued on home Abilify  20 mg, Remeron  45 mg nightly, tasimelteon  20 daily, gabapentin  300 mg daily. Patient stated they were out of Lamictal  for some months, so this medication was held.   Migraine headache Home Sumatriptan  50 mg q2 hours PRN daily was continued.     DISCHARGE INSTRUCTIONS:   Discharge Instructions     Ambulatory referral to Hematology / Oncology   Complete by: As directed    Bilateral PE, hypercoagulable workup.   Call MD for:  difficulty breathing, headache or visual disturbances   Complete by: As directed  Call MD for:  extreme fatigue   Complete by: As directed    Call MD for:  persistant dizziness or light-headedness   Complete by: As directed    Call MD for:  severe uncontrolled pain   Complete by: As directed    Diet - low sodium heart healthy   Complete by: As directed    Discharge instructions   Complete by: As directed    You were hospitalized for pulmonary embolisms. You were given blood thinners and tests and imaging were completed. Your DVT studies showed no clots in your legs. Your echocardiogram did not show any acute abnormalities. You are medically stable for discharge and outpatient follow up. Thank you for allowing us  to be part of your care.   We arranged for you to follow up at: the [facility] on January 22nd at 9:15am.   Please note these changes  made to your medications:   I have sent a refill of Eliquis  for you. Please be sure to take this medication twice daily to prevent futher blood clots.   You may continue taking your other medications as prescribed.   If you have any worsening shortness of breath, chest pain, lightheadedness/dizziness, please return to the emergency department for evaluation.   Please call our clinic if you have any questions or concerns, we may be able to help and keep you from a long and expensive emergency room wait. Our clinic and after hours phone number is [phone], the best time to call is Monday through Friday 9 am to 4 pm but there is always someone available 24/7 if you have an emergency. If you need medication refills please notify your pharmacy one week in advance and they will send us  a request.   Increase activity slowly   Complete by: As directed        SUBJECTIVE:   Patient seen and evaluated while resting in bed. She reports shortness of breath when taking deep breaths and rates her chest pain a 1/10. Reports chest pain on admission was worse with exertion and when sitting at rest. She reports her nausea, vomiting and diarrhea symptoms are better today. Reports taking her Eliquis  2 times per day other than for a 2-week period last September when she ran out of medication for a week and took another week to obtain the medication. No acute concerns.   Discharge Vitals:   BP (!) 149/93 (BP Location: Left Arm)   Pulse 62   Temp (!) 97.5 F (36.4 C) (Oral)   Resp 20   Ht 5' 3 (1.6 m)   Wt 94.8 kg   LMP 04/16/2023 (Exact Date)   SpO2 100%   BMI 37.02 kg/m   OBJECTIVE:   Physical Exam General: NAD, tired-appearing, cooperative Cardiovascular: RRR, no murmurs, rubs or gallops Respiratory: normal work of breathing, lungs clear to auscultation bilaterally Extremities: no edema noted on lower extremities, no posterior tenderness with calf squeeze    Pertinent Labs, Studies, and  Procedures:     Latest Ref Rng & Units 05/02/2023    5:13 AM 05/01/2023    7:22 AM 04/26/2023    3:39 PM  CBC  WBC 4.0 - 10.5 K/uL 7.4  11.6  7.1   Hemoglobin 12.0 - 15.0 g/dL 89.5  88.7  88.8   Hematocrit 36.0 - 46.0 % 33.7  34.3  35.7   Platelets 150 - 400 K/uL 445  488  475        Latest  Ref Rng & Units 05/02/2023    5:13 AM 05/01/2023    7:22 AM 04/26/2023    3:39 PM  CMP  Glucose 70 - 99 mg/dL 726  762  605   BUN 6 - 20 mg/dL 5  8  5    Creatinine 0.44 - 1.00 mg/dL 9.29  9.17  9.22   Sodium 135 - 145 mmol/L 134  135  135   Potassium 3.5 - 5.1 mmol/L 3.3  3.4  3.6   Chloride 98 - 111 mmol/L 103  102  103   CO2 22 - 32 mmol/L 22  18  22    Calcium  8.9 - 10.3 mg/dL 8.5  9.3  8.7     VAS US  LOWER EXTREMITY VENOUS (DVT) (ONLY [facility] & [facility]) Result Date: 05/02/2023 Summary: BILATERAL: - No evidence of deep vein thrombosis seen in the lower extremities, bilaterally. -No evidence of popliteal cyst, bilaterally.   *See table(s) above for measurements and observations. Electronically signed by Norman Serve on 05/02/2023 at 4:50:35 PM.    Final    ECHOCARDIOGRAM COMPLETE Result Date: 05/02/2023 IMPRESSIONS  1. Left ventricular ejection fraction, by estimation, is 50 to 55%. Left ventricular ejection fraction by 2D MOD biplane is 52.6 %. The left ventricle has low normal function. The left ventricle has no regional wall motion abnormalities. There is mild left ventricular hypertrophy. Left ventricular diastolic parameters were normal.  2. Right ventricular systolic function is low normal. The right ventricular size is normal. Tricuspid regurgitation signal is inadequate for assessing PA pressure.  3. The mitral valve is grossly normal. Trivial mitral valve regurgitation.  4. The aortic valve is tricuspid. Aortic valve regurgitation is not visualized.  5. The inferior vena cava is normal in size with greater than 50% respiratory variability, suggesting right atrial pressure of 3 mmHg. Comparison(s): Changes from  prior study are noted. 08/12/2022: LVEF 65-70%, moderate RV systolic dysfunction. Electronically signed by Vinie Maxcy MD Signature Date/Time: 05/02/2023/1:50:45 PM  CT Angio Chest PE W and/or Wo Contrast Addendum Date: 05/01/2023 ADDENDUM REPORT: 05/01/2023 18:48 ADDENDUM: These results were called by telephone at the time of interpretation on 05/01/2023 at 6:46 pm to provider ANDREW TEE , who verbally acknowledged these results. Electronically Signed   By: Greig Pique M.D.   On: 05/01/2023 18:48   Result Date: 05/01/2023 CLINICAL DATA:  High probability for PE. EXAM: CT ANGIOGRAPHY CHEST WITH IMPRESSION: 1. Pulmonary emboli are seen throughout segmental and subsegmental branches of the right lower lobe. Web-like pulmonary embolism in the distal left lower lobe pulmonary artery. No evidence for right heart strain. 2. Minimal patchy airspace and ground-glass opacities in the right lower lobe worrisome for infarct. 3. Air trapping in the bilateral lower lungs worrisome for small airways disease. Electronically Signed: By: Greig Pique M.D. On: 05/01/2023 18:41   DG Chest Portable 1 View Result Date: 05/01/2023 CLINICAL DATA:  Chest pain. EXAM: PORTABLE CHEST 1 VIEW COMPARISON:  April 26, 2023. FINDINGS: The heart size and mediastinal contours are within normal limits. Both lungs are clear. The visualized skeletal structures are unremarkable. IMPRESSION: No active disease. Electronically Signed   By: Lynwood Landy Raddle M.D.   On: 05/01/2023 08:21     Signed: Ozell Riff, MD Internal Medicine Resident, PGY-1 [facility]  Pager: [phone] 6:02 PM, 05/02/2023

## 2023-05-02 NOTE — Progress Notes (Signed)
   05/02/23 1555  SDOH Interventions  Housing Interventions Inpatient TOC   Resources placed on AVS

## 2023-05-02 NOTE — Progress Notes (Signed)
 SATURATION QUALIFICATIONS: (This note is used to comply with regulatory documentation for home oxygen)  Patient Saturations on Room Air at Rest = 100%  Patient Saturations on Room Air while Ambulating = 95%-100%   No desaturations noted within the 5 mins of ambulation.

## 2023-05-13 ENCOUNTER — Other Ambulatory Visit: Payer: Self-pay

## 2023-05-15 ENCOUNTER — Encounter: Payer: MEDICAID | Admitting: Student

## 2023-05-15 NOTE — Progress Notes (Deleted)
CC: ***  HPI:  Dana Hughes is a 43 y.o. female with a past medical history of hypertension, HFrEF, type 2 diabetes mellitus who presents for hospital follow-up.  Please see assessment and plan for full HPI.  Medications: Hypertension: Amlodipine 5 mg daily HFrEF: Lasix 40 mg daily Type 2 diabetes: Lantus 20 units daily, NovoLog 10 units twice daily with meals, metformin 1000 mg twice daily Chronic PE: Eliquis 5 mg twice daily Neuropathy: Gabapentin 300 mg 3 times daily Depression: Remeron 45 mg nightly  Patient was recently admitted to the hospital on 01/8-0 1/9 Patient was found to have worsening of her right ventricle systolic function.  Will evaluate to see how patient is doing.  Patient was started on Protonix.  Echo findings: 01/09: Left EF 50 to 55%, right ventricular systolic function low normal New decreased left ventricle function  During hospital stay Labs 01/09 Sodium 134, potassium 3.3, glucose 273, creatinine 0.70 Hemoglobin 10.4, platelets 445  Check mag, check potassium, check BMP -Potentially can start Entresto for patient  Past Medical History:  Diagnosis Date   Anxiety    Arthritis    Asthma    BPPV (benign paroxysmal positional vertigo) 05/18/2022   Bronchitis    BV (bacterial vaginosis)    Candidiasis    Carpal tunnel syndrome, bilateral 01/16/2019   Chest pain of uncertain etiology 07/18/2021   Constipation 12/10/2020   Cystocele without uterine prolapse    Depression    Diabetes mellitus without complication (HCC)    Endometriosis    Exertional dyspnea 06/24/2021   Headache(784.0)    migraines   Hypertension    no longer takes meds   Migraines    Mild concentric left ventricular hypertrophy (LVH) 08/12/2020   Echo (07/2020):  1. Left ventricular ejection fraction, by estimation, is 45 to 50%. The  left ventricle has mildly decreased function. The left ventricle  demonstrates global hypokinesis. There is mild asymmetric left  ventricular  hypertrophy of the basal-septal segment.  The interventricular septum is flattened in systole, consistent with right  ventricular pressure overload.   2. Right ve   Muscle spasm    Ovarian cyst    Pelvic pain in female 09/29/2015   Rectal bleeding 12/09/2020   FHx:   Father: colon cancer (34 yo)  Paternal aunt: colon cancer (~55 yo), breast cancer (unknown age).  Paternal gma: Breast cancer   Paternal 2nd cousin: Ovarian cancer     Colonoscopy (07/2020):  Perianal and digital rectal examinations: normal.  Findings:  - A patchy area of moderately nodular mucosa was found at the hepatic flexure and in the ascending colon.  - A patchy area of moderately no   Scoliosis    Thrombocytosis 08/12/2020   Uterine fibroid    Vulvovaginal candidiasis 05/15/2018     Current Outpatient Medications:    acetaminophen (TYLENOL) 500 MG tablet, Take 2 tablets (1,000 mg total) by mouth every 6 (six) hours as needed for mild pain (or Fever >/= 101)., Disp: 30 tablet, Rfl: 0   albuterol (PROVENTIL) (2.5 MG/3ML) 0.083% nebulizer solution, Take 3 mLs (2.5 mg total) by nebulization every 6 (six) hours as needed for wheezing or shortness of breath., Disp: 75 mL, Rfl: 0   albuterol (VENTOLIN HFA) 108 (90 Base) MCG/ACT inhaler, Inhale 2 puffs into the lungs every 6 (six) hours as needed for wheezing or shortness of breath. Reported on 09/29/2015, Disp: , Rfl:    amLODipine (NORVASC) 5 MG tablet, Take 1 tablet (5 mg total)  by mouth daily., Disp: 90 tablet, Rfl: 3   apixaban (ELIQUIS) 5 MG TABS tablet, Take 1 tablet (5 mg total) by mouth 2 (two) times daily., Disp: 60 tablet, Rfl: 2   ARIPiprazole (ABILIFY) 20 MG tablet, Take 1 tablet (20 mg total) by mouth daily. (Patient taking differently: Take 20 mg by mouth at bedtime.), Disp: 30 tablet, Rfl: 3   furosemide (LASIX) 20 MG tablet, Take 2 tablets (40 mg total) by mouth daily. (Patient not taking: Reported on 05/01/2023), Disp: 60 tablet, Rfl: 0   gabapentin  (NEURONTIN) 300 MG capsule, Take 1 capsule (300 mg total) by mouth 3 (three) times daily. (Patient taking differently: Take 300 mg by mouth 2 (two) times daily.), Disp: 90 capsule, Rfl: 3   insulin aspart (NOVOLOG) 100 UNIT/ML FlexPen, Inject 10 Units into the skin 2 (two) times daily with a meal., Disp: 6 mL, Rfl: 0   insulin glargine (LANTUS SOLOSTAR) 100 UNIT/ML Solostar Pen, Inject 20 Units into the skin daily at 10pm., Disp: 6 mL, Rfl: 0   lamoTRIgine (LAMICTAL) 150 MG tablet, Take 1 tablet (150 mg total) by mouth daily. (Patient not taking: Reported on 05/01/2023), Disp: 30 tablet, Rfl: 3   metFORMIN (GLUCOPHAGE) 1000 MG tablet, Take 1 tablet (1,000 mg total) by mouth 2 (two) times daily with a meal. (Patient taking differently: Take 1,000 mg by mouth daily with breakfast.), Disp: 90 tablet, Rfl: 3   mirtazapine (REMERON) 45 MG tablet, Take 1 tablet (45 mg total) by mouth at bedtime., Disp: 30 tablet, Rfl: 3   mometasone-formoterol (DULERA) 200-5 MCG/ACT AERO, Inhale 2 puffs into the lungs 2 (two) times daily., Disp: 13 g, Rfl: 2   omeprazole (PRILOSEC) 40 MG capsule, Take 1 capsule (40 mg total) by mouth daily., Disp: 30 capsule, Rfl: 1   Tasimelteon (HETLIOZ) 20 MG CAPS, Take 20 mg by mouth at bedtime., Disp: 30 capsule, Rfl: 3  Review of Systems:  ***  Constitutional: Eye: Respiratory: Cardiovascular: GI: MSK: GU: Skin: Neuro: Endocrine:   Physical Exam:  There were no vitals filed for this visit. *** General: Patient is sitting comfortably in the room  Eyes: Pupils equal and reactive to light, EOM intact  Head: Normocephalic, atraumatic  Neck: Supple, nontender, full range of motion, No JVD Cardio: Regular rate and rhythm, no murmurs, rubs or gallops. 2+ pulses to bilateral upper and lower extremities  Chest: No chest tenderness Pulmonary: Clear to ausculation bilaterally with no rales, rhonchi, and crackles  Abdomen: Soft, nontender with normoactive bowel sounds with no  rebound or guarding  Neuro: Alert and orientated x3. CN II-XII intact. Sensation intact to upper and lower extremities. 2+ patellar reflex.  Back: No midline tenderness, no step off or deformities noted. No paraspinal muscle tenderness.  Skin: No rashes noted  MSK: 5/5 strength to upper and lower extremities.    Assessment & Plan:   No problem-specific Assessment & Plan notes found for this encounter.    Patient {GC/GE:[id number]::"discussed with","seen with"} Dr. {NAMES:[id number]::"Guilloud","Hoffman","Mullen","Narendra","Williams","Vincent"}  Modena Slater, DO PGY-2 Internal Medicine Resident  Pager: [phone]

## 2023-05-20 ENCOUNTER — Other Ambulatory Visit: Payer: Self-pay

## 2023-05-22 ENCOUNTER — Encounter: Payer: MEDICAID | Admitting: Student

## 2023-05-28 ENCOUNTER — Institutional Professional Consult (permissible substitution): Payer: MEDICAID | Admitting: Plastic Surgery

## 2023-05-29 ENCOUNTER — Encounter: Payer: MEDICAID | Admitting: Student

## 2023-05-31 ENCOUNTER — Other Ambulatory Visit: Payer: Self-pay

## 2023-05-31 ENCOUNTER — Telehealth: Payer: Self-pay

## 2023-05-31 ENCOUNTER — Other Ambulatory Visit (HOSPITAL_COMMUNITY): Payer: Self-pay

## 2023-05-31 ENCOUNTER — Other Ambulatory Visit: Payer: Self-pay | Admitting: Student

## 2023-05-31 DIAGNOSIS — E119 Type 2 diabetes mellitus without complications: Secondary | ICD-10-CM

## 2023-05-31 MED ORDER — METFORMIN HCL 1000 MG PO TABS
1000.0000 mg | ORAL_TABLET | Freq: Two times a day (BID) | ORAL | 3 refills | Status: DC
  Filled 2023-05-31: qty 90, 45d supply, fill #0

## 2023-05-31 MED ORDER — METFORMIN HCL 1000 MG PO TABS
1000.0000 mg | ORAL_TABLET | Freq: Two times a day (BID) | ORAL | 3 refills | Status: AC
  Filled 2023-07-07 – 2024-03-20 (×3): qty 90, 45d supply, fill #0

## 2023-05-31 NOTE — Telephone Encounter (Signed)
 Patient attempting to refill metformin  medication, however [name] isnt registered with medicaid and medication cannot be refilled.   Could this be resent to pharmacy [facility])? May need to resend other meds written by this provider as well

## 2023-06-04 ENCOUNTER — Telehealth: Payer: Self-pay | Admitting: Plastic Surgery

## 2023-06-04 NOTE — Telephone Encounter (Signed)
provider out of office called 06-04-23 and pt agreed to move to 08-13-23

## 2023-06-11 ENCOUNTER — Other Ambulatory Visit: Payer: Self-pay

## 2023-06-25 ENCOUNTER — Other Ambulatory Visit: Payer: Self-pay

## 2023-06-26 ENCOUNTER — Ambulatory Visit: Payer: MEDICAID | Admitting: Obstetrics and Gynecology

## 2023-06-26 NOTE — Progress Notes (Deleted)
 11/22/22- 43 yoF former smoker for sleep evaluation    Patient pregnant Medical problem list includes Migraine, HTN, hx PulmonaryEmbolism/ Lovenox4/24/ , Asthma, DM2, Anxiety/ Depression, Endometriosis, FEAnemia,  Epworth score-22 Body weight-215 lbs Aware of snoring. Frequent sleep walking x 5 years. No injuries. Usually gets up, goes to kitchen for water, then back to bed. No ENT surgery. Several family with OSA. Brother on CPAP. Anxiety meds help sleep. Occ AM coffee. Bedtime 1:30-2:30 AM. Up 5-6 times to bathroom, sometimes unaware. Occ palpitation. Ovcc sharp pains in anterior chest wall. Has just learned that she is pregnant again. CTaChest PE 08/12/22->> IR Thrombolytic  IMPRESSION: 1. Marked severity bilateral pulmonary embolism with right heart strain (RV/LV Ratio = 1.31) which has been associated with an increased risk of morbidity and mortality. 2. Mild patchy right upper lobe and left lower lobe infiltrates. 3. Evidence of prior cholecystectomy  06/27/23- 42 yoF former smoker for sleep evaluation   Medical problem list includes Migraine, HTN, Chronic Pulmonary Embolism/ Eliquis , Asthma, DM2, Anxiety/ Depression, Endometriosis, FEAnemia,  NPSG 01/06/23- AHI 1.9/hr, desat to 88%, body weight 215 lbs Body weight today- Hosp January to r/o new PE- neg.   CT chest demonstrated chronic PE RLL. Echo EF 50-55%. To continue Eliquis.   ROS-see HPI   Negative unless "+" Constitutional:    weight loss, night sweats, fevers, chills, fatigue, lassitude. HEENT:    headaches, difficulty swallowing, tooth/dental problems, sore throat,       sneezing, itching, ear ache, nasal congestion, post nasal drip, snoring CV:    chest pain, orthopnea, PND, swelling in lower extremities, anasarca,                                  dizziness, palpitations Resp:   shortness of breath with exertion or at rest.                productive cough,   non-productive cough, coughing up of blood.              change  in color of mucus.  wheezing.   Skin:    rash or lesions. GI:  No-   heartburn, indigestion, abdominal pain, nausea, vomiting, diarrhea,                 change in bowel habits, loss of appetite GU: dysuria, change in color of urine, no urgency or frequency.   flank pain. MS:   joint pain, stiffness, decreased range of motion, back pain. Neuro-     nothing unusual Psych:  change in mood or affect.  +depression or +anxiety.   memory loss.  OBJ- Physical Exam General- Alert, Oriented, Affect-appropriate, Distress- none acute, +obese Skin- rash-none, lesions- none, excoriation- none, +tatoos, +stretch marks Lymphadenopathy- none Head- atraumatic            Eyes- Gross vision intact, PERRLA, conjunctivae and secretions clear            Ears- Hearing, canals-normal            Nose- +snorting, no-Septal dev, mucus, polyps, erosion, perforation             Throat- Mallampati III , +tongue stud , drainage- none, tonsils- atrophic, +teeth Neck- flexible , trachea midline, no stridor , thyroid nl, carotid no bruit Chest - symmetrical excursion , unlabored           Heart/CV- RRR , no murmur , no gallop  ,  no rub, nl s1 s2                           - JVD- none , edema- none, stasis changes- none, varices- none           Lung- clear to P&A, wheeze- none, cough- none , dullness-none, rub- none           Chest wall-  Abd-  Br/ Gen/ Rectal- Not done, not indicated Extrem- cyanosis- none, clubbing, none, atrophy- none, strength- nl Neuro- grossly intact to observation  .

## 2023-06-27 ENCOUNTER — Ambulatory Visit: Payer: MEDICAID | Admitting: Internal Medicine

## 2023-06-27 ENCOUNTER — Encounter: Payer: Self-pay | Admitting: Internal Medicine

## 2023-07-03 ENCOUNTER — Encounter: Payer: MEDICAID | Admitting: Student

## 2023-07-07 ENCOUNTER — Other Ambulatory Visit: Payer: Self-pay

## 2023-07-07 ENCOUNTER — Other Ambulatory Visit (HOSPITAL_COMMUNITY): Payer: Self-pay | Admitting: Psychiatry

## 2023-07-07 DIAGNOSIS — F3181 Bipolar II disorder: Secondary | ICD-10-CM

## 2023-07-08 ENCOUNTER — Telehealth: Payer: Self-pay

## 2023-07-08 ENCOUNTER — Telehealth: Payer: Self-pay | Admitting: *Deleted

## 2023-07-08 ENCOUNTER — Other Ambulatory Visit: Payer: Self-pay

## 2023-07-08 DIAGNOSIS — E119 Type 2 diabetes mellitus without complications: Secondary | ICD-10-CM

## 2023-07-08 MED ORDER — LANTUS SOLOSTAR 100 UNIT/ML ~~LOC~~ SOPN
20.0000 [IU] | PEN_INJECTOR | Freq: Every day | SUBCUTANEOUS | 3 refills | Status: DC
  Filled 2023-07-08 – 2023-08-07 (×2): qty 15, 75d supply, fill #0

## 2023-07-08 NOTE — Telephone Encounter (Signed)
 Called patient stated had passed out on 07-29-2023 night after she had passed out another time.  States was having a lot of pain in her side.  Refused to go to the ER or Urgent care stating did not have a ride.  Given appointment for Wednesday as this was the next available.  Patient to come for appointment instead of going to the ER or Urgent care.

## 2023-07-08 NOTE — Telephone Encounter (Signed)
 Patient attempting to refill Lantus medication, however Dr. Justin Mend isnt registered with medicaid and medication cannot be refilled.    Could this be resent to pharmacy [facility]) with a different provider?

## 2023-07-08 NOTE — Telephone Encounter (Signed)
 Lantus with refills sent to [facility].

## 2023-07-09 ENCOUNTER — Telehealth: Payer: Self-pay | Admitting: Plastic Surgery

## 2023-07-09 NOTE — Telephone Encounter (Signed)
 Provider out of office 08-13-23 and pt agreed to a different day

## 2023-07-10 ENCOUNTER — Other Ambulatory Visit: Payer: Self-pay

## 2023-07-10 ENCOUNTER — Other Ambulatory Visit (HOSPITAL_COMMUNITY): Payer: Self-pay

## 2023-07-10 ENCOUNTER — Ambulatory Visit (HOSPITAL_COMMUNITY)
Admission: RE | Admit: 2023-07-10 | Discharge: 2023-07-10 | Disposition: A | Discharge status: Home / Self Care | Payer: MEDICAID | Source: Ambulatory Visit | Attending: Family Medicine | Admitting: Family Medicine

## 2023-07-10 ENCOUNTER — Encounter: Payer: Self-pay | Admitting: Student

## 2023-07-10 ENCOUNTER — Ambulatory Visit: Payer: MEDICAID | Admitting: Student

## 2023-07-10 VITALS — BP 121/82 | HR 84 | Temp 98.4°F | Ht 63.0 in | Wt 205.2 lb

## 2023-07-10 DIAGNOSIS — Z1231 Encounter for screening mammogram for malignant neoplasm of breast: Secondary | ICD-10-CM

## 2023-07-10 DIAGNOSIS — K589 Irritable bowel syndrome without diarrhea: Secondary | ICD-10-CM | POA: Insufficient documentation

## 2023-07-10 DIAGNOSIS — I951 Orthostatic hypotension: Secondary | ICD-10-CM | POA: Insufficient documentation

## 2023-07-10 DIAGNOSIS — K580 Irritable bowel syndrome with diarrhea: Secondary | ICD-10-CM

## 2023-07-10 DIAGNOSIS — D509 Iron deficiency anemia, unspecified: Secondary | ICD-10-CM

## 2023-07-10 DIAGNOSIS — G43009 Migraine without aura, not intractable, without status migrainosus: Secondary | ICD-10-CM

## 2023-07-10 DIAGNOSIS — R9431 Abnormal electrocardiogram [ECG] [EKG]: Secondary | ICD-10-CM | POA: Diagnosis not present

## 2023-07-10 DIAGNOSIS — E559 Vitamin D deficiency, unspecified: Secondary | ICD-10-CM

## 2023-07-10 DIAGNOSIS — R079 Chest pain, unspecified: Secondary | ICD-10-CM | POA: Insufficient documentation

## 2023-07-10 DIAGNOSIS — G43909 Migraine, unspecified, not intractable, without status migrainosus: Secondary | ICD-10-CM

## 2023-07-10 DIAGNOSIS — I2782 Chronic pulmonary embolism: Secondary | ICD-10-CM

## 2023-07-10 DIAGNOSIS — R5383 Other fatigue: Secondary | ICD-10-CM | POA: Diagnosis not present

## 2023-07-10 DIAGNOSIS — R55 Syncope and collapse: Secondary | ICD-10-CM | POA: Insufficient documentation

## 2023-07-10 MED ORDER — DICYCLOMINE HCL 20 MG PO TABS
20.0000 mg | ORAL_TABLET | Freq: Three times a day (TID) | ORAL | 3 refills | Status: AC | PRN
  Filled 2023-07-10 – 2023-08-07 (×2): qty 90, 30d supply, fill #0

## 2023-07-10 MED ORDER — SUMATRIPTAN SUCCINATE 50 MG PO TABS
50.0000 mg | ORAL_TABLET | ORAL | 3 refills | Status: DC | PRN
  Filled 2023-07-10: qty 9, 30d supply, fill #0
  Filled 2023-08-07: qty 12, 30d supply, fill #0

## 2023-07-10 MED ORDER — LIDOCAINE 4 % EX PTCH
1.0000 | MEDICATED_PATCH | Freq: Every day | CUTANEOUS | 6 refills | Status: AC | PRN
  Filled 2023-07-10: qty 5, 5d supply, fill #0
  Filled 2023-07-10: qty 6, 6d supply, fill #0
  Filled 2023-08-07: qty 5, 5d supply, fill #0

## 2023-07-10 MED ORDER — LIDOCAINE 5 % EX OINT
1.0000 | TOPICAL_OINTMENT | CUTANEOUS | 0 refills | Status: AC | PRN
  Filled 2023-07-10: qty 35.44, 30d supply, fill #0

## 2023-07-10 NOTE — Assessment & Plan Note (Signed)
 She has a history of this. Over the last month, she notes pica. She is eating ice and chewing on tissues. - Iron panel, ferritin

## 2023-07-10 NOTE — Patient Instructions (Addendum)
 Start Bentyl. Take up to 3 times per day for abdominal pain and spasms.  Resume your sumatriptan. Take once for migraine and repeat once after two hours for persistent migraine.  Going forward, do you best to keep yourself hydrated. Stand up slowly and sit down the moment you feel lightheaded.  Use lidocaine for your chest pain. Try the cream first. If that does not relieve it, try placing the patch where pain is worst. The patch will work for 24 hours.  I will check labs today.  I am very confident that your chest pain is not due to your heart.

## 2023-07-10 NOTE — Assessment & Plan Note (Signed)
 She has a migraine today. She has had many over the last month and is without her sumatriptan. I expect this is contributing to her malaise. - Refill sumatriptan 50 prn, additional dose after 2h prn

## 2023-07-10 NOTE — Assessment & Plan Note (Signed)
 Remains on her eliquis. No dyspnea. No hypoxia. Will not pursue imaging. - continue eliquis 5 BID

## 2023-07-10 NOTE — Assessment & Plan Note (Signed)
 To an extent this is a chronic issue. As before her pain is pleuritic and reproduced on palpation. I do not suspect her pain is cardiac. EKG today unrevealing, only shows a L axis. Echo at hospitalization two months ago with mild LVH, good EF, no other structural changes. No dyspnea on exertion. Vital stable. Suspect costochondritis and feel her chronic PE might contribute. - Treat symptomatically with NSAIDs, lidocaine. Does not take acetaminophen due to an intentional overdose as a teenager

## 2023-07-10 NOTE — Assessment & Plan Note (Addendum)
 2 episodes of passing out and falling while walking over the last 10 days.  As above, both in the setting of abdominal pain and consistent with an orthostatic cause.  History not consistent with seizure and there were no palpitations or respiratory or psychiatric distress. Although today her orthostatics were negative, history is very consistent with an orthostatic issue. Exam unrevealing. Suspect poor intake in the setting of her ongoing abdominal pain and suspicion of IBS. Discussed hydration and fall precautions. - CBC, CMP

## 2023-07-10 NOTE — Assessment & Plan Note (Addendum)
 She has ongoing chronic abdominal pain, varying bowel habits with non-bloody diarrhea and constipation. I suspect this is impacting her diet and hydration. - Bentyl 20 TID prn

## 2023-07-10 NOTE — Progress Notes (Signed)
 CC: Chest pain, syncope  HPI:  Dana Hughes is a 43 y.o. female with a PMH stated below who presents today for evaluation of chest pain and syncope. She has atypical chest pain worse over 3 weeks although chronic as well and experienced syncope twice over last 10 days at home after standing up.  Please see problem based assessment and plan for additional details.  Past Medical History:  Diagnosis Date   Anxiety    Arthritis    Asthma    BPPV (benign paroxysmal positional vertigo) 05/18/2022   Bronchitis    BV (bacterial vaginosis)    Candidiasis    Carpal tunnel syndrome, bilateral 01/16/2019   Chest pain of uncertain etiology 07/18/2021   Constipation 12/10/2020   Cystocele without uterine prolapse    Depression    Diabetes mellitus without complication (HCC)    Endometriosis    Exertional dyspnea 06/24/2021   Headache(784.0)    migraines   Hypertension    no longer takes meds   Migraines    Mild concentric left ventricular hypertrophy (LVH) 08/12/2020   Echo (07/2020):  1. Left ventricular ejection fraction, by estimation, is 45 to 50%. The  left ventricle has mildly decreased function. The left ventricle  demonstrates global hypokinesis. There is mild asymmetric left ventricular  hypertrophy of the basal-septal segment.  The interventricular septum is flattened in systole, consistent with right  ventricular pressure overload.   2. Right ve   Muscle spasm    Ovarian cyst    Pelvic pain in female 09/29/2015   Rectal bleeding 12/09/2020   FHx:   Father: colon cancer (55 yo)  Paternal aunt: colon cancer (~33 yo), breast cancer (unknown age).  Paternal gma: Breast cancer   Paternal 2nd cousin: Ovarian cancer     Colonoscopy (07/2020):  Perianal and digital rectal examinations: normal.  Findings:  - A patchy area of moderately nodular mucosa was found at the hepatic flexure and in the ascending colon.  - A patchy area of moderately no   Scoliosis    Thrombocytosis  08/12/2020   Uterine fibroid    Vulvovaginal candidiasis 05/15/2018    Review of Systems: ROS negative except for what is noted on the assessment and plan.  Vitals:   07/10/23 0943 07/10/23 1005  BP: (!) 136/90 121/82  Pulse: 84   Temp: 98.4 F (36.9 C)   TempSrc: Oral   SpO2: 100%   Weight: 205 lb 3.2 oz (93.1 kg)   Height: 5\' 3"  (1.6 m)     Physical Exam: Constitutional: anxious-appearing woman in no acute distress HENT: normocephalic atraumatic, mucous membranes moist Eyes: conjunctiva non-erythematous Cardiovascular: regular rate and rhythm, no m/r/g Pulmonary/Chest: normal work of breathing on room air, lungs clear to auscultation bilaterally Abdominal: soft, non-tender, non-distended MSK: normal bulk and tone Neurological: alert & oriented x 3, no focal deficit Skin: warm and dry Psych: normal mood and behavior  Assessment & Plan:    Patient discussed with Dr. Cleda Daub  [name] is here for a series of symptoms, most of which she experiences chronically but have been worse over the last 3 weeks.  In addition she experienced 2 episodes of syncope over the last 10 days.  She passed out three days ago and 10 days ago shortly after standing. She describes dizziness and lightheadedness just before. She denies injury related to the fall. She was observed by her family member who woke her up.  She describes tight and pressure-like chest pain that  is worse with breathing and is located in the central chest and feels short of breath due to her breathing pain.  She describes ongoing right and left flank, rib, and upper abdominal pain.  She describes varying bowel patterns, often having nonbloody diarrhea 1 day and constipation the next.  She has a migraine headache and is out of her sumatriptan.  She feels fatigued every day.  She is exhibiting pica, chewing ice and tissues.  She denies nausea or vomiting, fevers and chills.  Syncope and collapse 2 episodes of passing out  and falling while walking over the last 10 days.  As above, both in the setting of abdominal pain and consistent with an orthostatic cause.  History not consistent with seizure and there were no palpitations or respiratory or psychiatric distress. Although today her orthostatics were negative, history is very consistent with an orthostatic issue. Exam unrevealing. Suspect poor intake in the setting of her ongoing abdominal pain and suspicion of IBS. Discussed hydration and fall precautions. - CBC, CMP  Chest pain To an extent this is a chronic issue. As before her pain is pleuritic and reproduced on palpation. I do not suspect her pain is cardiac. EKG today unrevealing, only shows a L axis. Echo at hospitalization two months ago with mild LVH, good EF, no other structural changes. No dyspnea on exertion. Vital stable. Suspect costochondritis and feel her chronic PE might contribute. - Treat symptomatically with NSAIDs, lidocaine. Does not take acetaminophen due to an intentional overdose as a teenager  Irritable bowel She has ongoing chronic abdominal pain, varying bowel habits with non-bloody diarrhea and constipation. I suspect this is impacting her diet and hydration. - Bentyl 20 TID prn  Atypical migraine She has a migraine today. She has had many over the last month and is without her sumatriptan. I expect this is contributing to her malaise. - Refill sumatriptan 50 prn, additional dose after 2h prn  Iron deficiency anemia She has a history of this. Over the last month, she notes pica. She is eating ice and chewing on tissues. - Iron panel, ferritin  Chronic pulmonary embolism (HCC) Remains on her eliquis. No dyspnea. No hypoxia. Will not pursue imaging. - continue eliquis 5 BID  RTC in 4 weeks for checkup of the above.  Dana Hughes, D.O. [facility], PGY-1 Phone: [phone] Date 07/10/2023 Time 1:21 PM

## 2023-07-11 LAB — CBC WITH DIFFERENTIAL/PLATELET
Basophils Absolute: 0 10*3/uL (ref 0.0–0.2)
Basos: 0 %
EOS (ABSOLUTE): 0.2 10*3/uL (ref 0.0–0.4)
Eos: 2 %
Hematocrit: 39.8 % (ref 34.0–46.6)
Hemoglobin: 11.8 g/dL (ref 11.1–15.9)
Immature Grans (Abs): 0 10*3/uL (ref 0.0–0.1)
Immature Granulocytes: 0 %
Lymphocytes Absolute: 2.8 10*3/uL (ref 0.7–3.1)
Lymphs: 30 %
MCH: 24.2 pg — ABNORMAL LOW (ref 26.6–33.0)
MCHC: 29.6 g/dL — ABNORMAL LOW (ref 31.5–35.7)
MCV: 82 fL (ref 79–97)
Monocytes Absolute: 0.5 10*3/uL (ref 0.1–0.9)
Monocytes: 5 %
Neutrophils Absolute: 5.7 10*3/uL (ref 1.4–7.0)
Neutrophils: 63 %
Platelets: 522 10*3/uL — ABNORMAL HIGH (ref 150–450)
RBC: 4.87 x10E6/uL (ref 3.77–5.28)
RDW: 15.1 % (ref 11.7–15.4)
WBC: 9.2 10*3/uL (ref 3.4–10.8)

## 2023-07-11 LAB — CMP14 + ANION GAP
ALT: 30 IU/L (ref 0–32)
AST: 22 IU/L (ref 0–40)
Albumin: 4.1 g/dL (ref 3.9–4.9)
Alkaline Phosphatase: 85 IU/L (ref 44–121)
Anion Gap: 15 mmol/L (ref 10.0–18.0)
BUN/Creatinine Ratio: 6 — ABNORMAL LOW (ref 9–23)
BUN: 4 mg/dL — ABNORMAL LOW (ref 6–24)
Bilirubin Total: 0.3 mg/dL (ref 0.0–1.2)
CO2: 24 mmol/L (ref 20–29)
Calcium: 9.5 mg/dL (ref 8.7–10.2)
Chloride: 97 mmol/L (ref 96–106)
Creatinine, Ser: 0.63 mg/dL (ref 0.57–1.00)
Globulin, Total: 3.2 g/dL (ref 1.5–4.5)
Glucose: 282 mg/dL — ABNORMAL HIGH (ref 70–99)
Potassium: 3.7 mmol/L (ref 3.5–5.2)
Sodium: 136 mmol/L (ref 134–144)
Total Protein: 7.3 g/dL (ref 6.0–8.5)
eGFR: 114 mL/min/{1.73_m2} (ref >59)

## 2023-07-11 LAB — VITAMIN D 25 HYDROXY (VIT D DEFICIENCY, FRACTURES): Vit D, 25-Hydroxy: 8.4 ng/mL — ABNORMAL LOW (ref 30.0–100.0)

## 2023-07-11 LAB — IRON AND TIBC
Iron Saturation: 15 % (ref 15–55)
Iron: 64 ug/dL (ref 27–159)
Total Iron Binding Capacity: 420 ug/dL (ref 250–450)
UIBC: 356 ug/dL (ref 131–425)

## 2023-07-11 LAB — FERRITIN: Ferritin: 16 ng/mL (ref 15–150)

## 2023-07-12 ENCOUNTER — Other Ambulatory Visit: Payer: Self-pay

## 2023-07-12 MED ORDER — CHOLECALCIFEROL 125 MCG (5000 UT) PO CAPS
5000.0000 [IU] | ORAL_CAPSULE | Freq: Every day | ORAL | 2 refills | Status: AC
  Filled 2023-07-12 – 2023-08-07 (×2): qty 30, 30d supply, fill #0

## 2023-07-12 NOTE — Addendum Note (Signed)
 Addended by: Katheran James on: 07/12/2023 12:09 PM   Modules accepted: Orders

## 2023-07-15 ENCOUNTER — Encounter: Payer: Self-pay | Admitting: Student

## 2023-07-16 ENCOUNTER — Encounter: Payer: MEDICAID | Admitting: Student

## 2023-07-16 NOTE — Telephone Encounter (Signed)
 I called and talked to pt. No available appts until Thursday at this time. Scheduled 3/27 @ 0945 AM . Pt instructed to go to UC if symptoms worsen.

## 2023-07-16 NOTE — Addendum Note (Signed)
 Addended by: Katheran James on: 07/16/2023 10:40 AM   Modules accepted: Orders

## 2023-07-18 ENCOUNTER — Ambulatory Visit (HOSPITAL_COMMUNITY)
Admission: RE | Admit: 2023-07-18 | Discharge: 2023-07-18 | Disposition: A | Discharge status: Home / Self Care | Payer: MEDICAID | Source: Ambulatory Visit | Attending: Internal Medicine | Admitting: Internal Medicine

## 2023-07-18 ENCOUNTER — Ambulatory Visit: Payer: MEDICAID | Admitting: Student

## 2023-07-18 VITALS — HR 89 | Temp 98.5°F | Ht 63.0 in | Wt 205.3 lb

## 2023-07-18 DIAGNOSIS — R051 Acute cough: Secondary | ICD-10-CM

## 2023-07-18 DIAGNOSIS — J101 Influenza due to other identified influenza virus with other respiratory manifestations: Secondary | ICD-10-CM

## 2023-07-18 DIAGNOSIS — E119 Type 2 diabetes mellitus without complications: Secondary | ICD-10-CM | POA: Diagnosis not present

## 2023-07-18 DIAGNOSIS — Z794 Long term (current) use of insulin: Secondary | ICD-10-CM

## 2023-07-18 DIAGNOSIS — R0602 Shortness of breath: Secondary | ICD-10-CM

## 2023-07-18 LAB — POCT GLYCOSYLATED HEMOGLOBIN (HGB A1C): Hemoglobin A1C: 11.2 % — AB (ref 4.0–5.6)

## 2023-07-18 LAB — GLUCOSE, CAPILLARY: Glucose-Capillary: 266 mg/dL — ABNORMAL HIGH (ref 70–99)

## 2023-07-18 LAB — RESP PANEL BY RT-PCR (RSV, FLU A&B, COVID)  RVPGX2
Influenza A by PCR: POSITIVE — AB
Influenza B by PCR: NEGATIVE
Resp Syncytial Virus by PCR: NEGATIVE
SARS Coronavirus 2 by RT PCR: NEGATIVE

## 2023-07-18 NOTE — Assessment & Plan Note (Signed)
 Patient states since Sunday (4 days ago) she has had coughing, myalgia, chills, abdominal pain, loose stools, shortness of breath, muscle aches, and generalized malaise.  She does endorse some blood in her stools, but upon further questioning she states she has history of hemorrhoids and states the blood was mostly present when wiping.  She denies frank melena or hematochezia.  She does have chest pain at baseline and history of pulmonary embolism, but states that her chest pain is actually improved if anything.  She has been using her nebulizers and albuterol at home with some relief.  She denies any episodes of syncope, endorses throwing up clear phlegm.  Otherwise denies hemoptysis or nausea vomiting.  Respiratory viral PCR was obtained today and demonstrated flu A infection.  Chest x-ray was obtained and did not show concern for pneumonia.  This was discussed with the patient over the phone on the evening of this visit.  We will plan to treat symptomatically, return precautions discussed should she have worsening fever, chills, chest pain, or other signs of pneumonia.  Patient demonstrated understanding of this.

## 2023-07-18 NOTE — Progress Notes (Signed)
 Subjective:  CC: Cough, acute illness  HPI:  Dana Hughes is a 43 y.o. person with a past medical history stated below and presents today for the stated chief complaint. Please see problem based assessment and plan for additional details.  Past Medical History:  Diagnosis Date   Anxiety    Arthritis    Asthma    BPPV (benign paroxysmal positional vertigo) 05/18/2022   Bronchitis    BV (bacterial vaginosis)    Candidiasis    Carpal tunnel syndrome, bilateral 01/16/2019   Chest pain of uncertain etiology 07/18/2021   Constipation 12/10/2020   Cystocele without uterine prolapse    Depression    Diabetes mellitus without complication (HCC)    Endometriosis    Exertional dyspnea 06/24/2021   Headache(784.0)    migraines   Hypertension    no longer takes meds   Migraines    Mild concentric left ventricular hypertrophy (LVH) 08/12/2020   Echo (07/2020):  1. Left ventricular ejection fraction, by estimation, is 45 to 50%. The  left ventricle has mildly decreased function. The left ventricle  demonstrates global hypokinesis. There is mild asymmetric left ventricular  hypertrophy of the basal-septal segment.  The interventricular septum is flattened in systole, consistent with right  ventricular pressure overload.   2. Right ve   Muscle spasm    Ovarian cyst    Pelvic pain in female 09/29/2015   Rectal bleeding 12/09/2020   FHx:   Father: colon cancer (100 yo)  Paternal aunt: colon cancer (~74 yo), breast cancer (unknown age).  Paternal gma: Breast cancer   Paternal 2nd cousin: Ovarian cancer     Colonoscopy (07/2020):  Perianal and digital rectal examinations: normal.  Findings:  - A patchy area of moderately nodular mucosa was found at the hepatic flexure and in the ascending colon.  - A patchy area of moderately no   Scoliosis    Thrombocytosis 08/12/2020   Uterine fibroid    Vulvovaginal candidiasis 05/15/2018    Current Outpatient Medications on File Prior to  Visit  Medication Sig Dispense Refill   acetaminophen (TYLENOL) 500 MG tablet Take 2 tablets (1,000 mg total) by mouth every 6 (six) hours as needed for mild pain (or Fever >/= 101). 30 tablet 0   albuterol (PROVENTIL) (2.5 MG/3ML) 0.083% nebulizer solution Take 3 mLs (2.5 mg total) by nebulization every 6 (six) hours as needed for wheezing or shortness of breath. 75 mL 0   albuterol (VENTOLIN HFA) 108 (90 Base) MCG/ACT inhaler Inhale 2 puffs into the lungs every 6 (six) hours as needed for wheezing or shortness of breath. Reported on 09/29/2015     amLODipine (NORVASC) 5 MG tablet Take 1 tablet (5 mg total) by mouth daily. 90 tablet 3   apixaban (ELIQUIS) 5 MG TABS tablet Take 1 tablet (5 mg total) by mouth 2 (two) times daily. 60 tablet 2   ARIPiprazole (ABILIFY) 20 MG tablet Take 1 tablet (20 mg total) by mouth daily. (Patient taking differently: Take 20 mg by mouth at bedtime.) 30 tablet 3   Cholecalciferol 125 MCG (5000 UT) capsule Take 1 capsule (5,000 Units total) by mouth daily. 30 capsule 2   dicyclomine (BENTYL) 20 MG tablet Take 1 tablet (20 mg total) by mouth 3 (three) times daily as needed for spasms. 90 tablet 3   furosemide (LASIX) 20 MG tablet Take 2 tablets (40 mg total) by mouth daily. (Patient not taking: Reported on 05/01/2023) 60 tablet 0   gabapentin (NEURONTIN)  300 MG capsule Take 1 capsule (300 mg total) by mouth 3 (three) times daily. (Patient taking differently: Take 300 mg by mouth 2 (two) times daily.) 90 capsule 3   insulin aspart (NOVOLOG) 100 UNIT/ML FlexPen Inject 10 Units into the skin 2 (two) times daily with a meal. 6 mL 0   insulin glargine (LANTUS SOLOSTAR) 100 UNIT/ML Solostar Pen Inject 20 Units into the skin daily at 10pm. 15 mL 3   lamoTRIgine (LAMICTAL) 150 MG tablet Take 1 tablet (150 mg total) by mouth daily. (Patient not taking: Reported on 05/01/2023) 30 tablet 3   lidocaine (HM LIDOCAINE PATCH) 4 % Place 1 patch onto the skin daily as needed. 6 patch 6    lidocaine (XYLOCAINE) 5 % ointment Apply 1 Application topically as needed. 35.44 g 0   metFORMIN (GLUCOPHAGE) 1000 MG tablet Take 1 tablet (1,000 mg total) by mouth 2 (two) times daily with a meal. 90 tablet 3   mirtazapine (REMERON) 45 MG tablet Take 1 tablet (45 mg total) by mouth at bedtime. 30 tablet 3   mometasone-formoterol (DULERA) 200-5 MCG/ACT AERO Inhale 2 puffs into the lungs 2 (two) times daily. 13 g 2   omeprazole (PRILOSEC) 40 MG capsule Take 1 capsule (40 mg total) by mouth daily. 30 capsule 1   SUMAtriptan (IMITREX) 50 MG tablet Take 1 tablet (50 mg total) by mouth as needed for migraine. May repeat in 2 hours if headache persists or recurs. Do not take more than 2 tablets per day. 60 tablet 3   Tasimelteon (HETLIOZ) 20 MG CAPS Take 20 mg by mouth at bedtime. 30 capsule 3   No current facility-administered medications on file prior to visit.    Review of Systems: Please see assessment and plan for pertinent positives and negatives.  Objective:   Vitals:   07/18/23 0948  Pulse: 89  Temp: 98.5 F (36.9 C)  TempSrc: Oral  SpO2: 100%  Weight: 205 lb 4.8 oz (93.1 kg)  Height: 5\' 3"  (1.6 m)    Physical Exam: Constitutional: Tired-appearing, nontoxic appearing Cardiovascular: Regular rate and rhythm Pulmonary/Chest: Mid inspiratory friction rub, coarse breath sounds.  No significant wheezing.   Abdominal: soft, minimal-tenderness, non-distended Extremities: No edema of the lower extremities bilaterally Psych: Appropriate affect Thought process is linear and is goal-directed.     Assessment & Plan:  Influenza A Patient states since Sunday (4 days ago) she has had coughing, myalgia, chills, abdominal pain, loose stools, shortness of breath, muscle aches, and generalized malaise.  She does endorse some blood in her stools, but upon further questioning she states she has history of hemorrhoids and states the blood was mostly present when wiping.  She denies frank melena  or hematochezia.  She does have chest pain at baseline and history of pulmonary embolism, but states that her chest pain is actually improved if anything.  She has been using her nebulizers and albuterol at home with some relief.  She denies any episodes of syncope, endorses throwing up clear phlegm.  Otherwise denies hemoptysis or nausea vomiting.  Respiratory viral PCR was obtained today and demonstrated flu A infection.  Chest x-ray was obtained and did not show concern for pneumonia.  This was discussed with the patient over the phone on the evening of this visit.  We will plan to treat symptomatically, return precautions discussed should she have worsening fever, chills, chest pain, or other signs of pneumonia.  Patient demonstrated understanding of this.    Diabetes mellitus, type 2 (  HCC) A1c obtained today, last A1c 9.2 => 11.2 today.  This was discussed with the patient, she does have significant difficulties with obtaining medication, and states she takes her insulin every other day due to cost.  She will require dedicated diabetes follow-up given that the majority of this encounter was focused on her respiratory symptoms.  She was instructed to return in about 2 weeks for diabetes follow-up once her symptoms have resolved.   Patient discussed with Dr. Elliot Cousin MD [facility]  PGY-1 Pager: [phone]  Phone: [phone] Date 07/18/2023  Time 2:37 PM

## 2023-07-18 NOTE — Assessment & Plan Note (Signed)
 A1c obtained today, last A1c 9.2 => 11.2 today.  This was discussed with the patient, she does have significant difficulties with obtaining medication, and states she takes her insulin every other day due to cost.  She will require dedicated diabetes follow-up given that the majority of this encounter was focused on her respiratory symptoms.  She was instructed to return in about 2 weeks for diabetes follow-up once her symptoms have resolved.

## 2023-07-18 NOTE — Patient Instructions (Addendum)
 Thank you, Dana Hughes for allowing [place] to provide your care today.  I will call you to discuss your lab results. If your chest xray shows pneumonia, I will send in anti-biotics to help with your infection. Otherwise, please make sure to drink plenty of fluids, and take Tylenol and ibuprofen as needed to help manage her symptoms.  Please continue to use your inhalers and your nebulizer.  If you have worsening chest pain, shortness of breath, or worsening of your symptoms please feel free to reach out to [place] and/or seek additional medical help.  I have ordered the following tests for you:   Lab Orders         Glucose, capillary         POC Hbg A1C     Chest Xray Covid / Flu / RSV testing    Follow up:  2 weeks  for dedicated diabetes follow up     We look forward to seeing you next time. Please call our clinic at [phone] if you have any questions or concerns. The best time to call is Monday-Friday from 9am-4pm, but there is someone available 24/7. If after hours or the weekend, call the main hospital number and ask for the Internal Medicine Resident On-Call. If you need medication refills, please notify your pharmacy one week in advance and they will send [place] a request.   Thank you for trusting me with your care. Wishing you the best!  Lovie Macadamia MD [facility]

## 2023-07-19 NOTE — Progress Notes (Signed)
 Internal Medicine Clinic Attending  Case discussed with the resident at the time of the visit.  We reviewed the resident's history and exam and pertinent patient test results.  I agree with the assessment, diagnosis, and plan of care documented in the resident's note.

## 2023-07-19 NOTE — Addendum Note (Signed)
 Addended by: Gust Rung on: 07/19/2023 09:40 PM   Modules accepted: Level of Service

## 2023-07-22 ENCOUNTER — Other Ambulatory Visit (HOSPITAL_COMMUNITY): Payer: Self-pay

## 2023-07-22 ENCOUNTER — Other Ambulatory Visit: Payer: Self-pay

## 2023-07-24 ENCOUNTER — Telehealth: Payer: Self-pay | Admitting: *Deleted

## 2023-07-24 NOTE — Telephone Encounter (Signed)
 Called patient lvm regarding getting mammogram scheduled / waiting call back.

## 2023-07-28 NOTE — Progress Notes (Unsigned)
 Patient rescheduled appointment.  Lamont Snowball, MSN, CNM, RNC-OB Certified Nurse Midwife, [facility] 07/30/2023 9:02 AM

## 2023-07-30 ENCOUNTER — Encounter: Payer: Self-pay | Admitting: Obstetrics and Gynecology

## 2023-07-30 ENCOUNTER — Ambulatory Visit: Payer: MEDICAID | Admitting: Certified Nurse Midwife

## 2023-07-30 DIAGNOSIS — Z113 Encounter for screening for infections with a predominantly sexual mode of transmission: Secondary | ICD-10-CM

## 2023-07-30 DIAGNOSIS — Z01419 Encounter for gynecological examination (general) (routine) without abnormal findings: Secondary | ICD-10-CM

## 2023-08-01 ENCOUNTER — Ambulatory Visit: Payer: MEDICAID | Admitting: Orthopaedic Surgery

## 2023-08-05 ENCOUNTER — Other Ambulatory Visit: Payer: Self-pay

## 2023-08-05 ENCOUNTER — Encounter: Payer: MEDICAID | Admitting: Internal Medicine

## 2023-08-05 NOTE — Progress Notes (Deleted)
 Knot on R breast She does have family history of breast cancer. An order to have a mammogram was placed on 03/25 however at that visit she did not raise concern about a breast knot***.  Plan: Referral placed for mammogram and ultrasound.***

## 2023-08-07 ENCOUNTER — Other Ambulatory Visit (HOSPITAL_COMMUNITY): Payer: Self-pay | Admitting: Psychiatry

## 2023-08-07 DIAGNOSIS — F3181 Bipolar II disorder: Secondary | ICD-10-CM

## 2023-08-07 DIAGNOSIS — F411 Generalized anxiety disorder: Secondary | ICD-10-CM

## 2023-08-08 ENCOUNTER — Other Ambulatory Visit: Payer: Self-pay

## 2023-08-09 ENCOUNTER — Other Ambulatory Visit: Payer: Self-pay

## 2023-08-09 ENCOUNTER — Institutional Professional Consult (permissible substitution): Payer: MEDICAID | Admitting: Plastic Surgery

## 2023-08-10 ENCOUNTER — Encounter: Payer: Self-pay | Admitting: Student

## 2023-08-12 ENCOUNTER — Ambulatory Visit: Payer: MEDICAID | Admitting: Internal Medicine

## 2023-08-12 DIAGNOSIS — R0789 Other chest pain: Secondary | ICD-10-CM | POA: Diagnosis not present

## 2023-08-12 NOTE — Progress Notes (Signed)
 [facility] Internal Medicine Residency Telephone Encounter Continuity Care Appointment  HPI:  This telephone encounter was created for Dana Hughes on 08/12/2023 for the following purpose/cc: chest pain, generally feeling poorly.   Past Medical History:  Past Medical History:  Diagnosis Date   Anxiety    Arthritis    Asthma    BPPV (benign paroxysmal positional vertigo) 05/18/2022   Bronchitis    BV (bacterial vaginosis)    Candidiasis    Carpal tunnel syndrome, bilateral 01/16/2019   Chest pain of uncertain etiology 07/18/2021   Constipation 12/10/2020   Cystocele without uterine prolapse    Depression    Diabetes mellitus without complication (HCC)    Endometriosis    Exertional dyspnea 06/24/2021   Headache(784.0)    migraines   Hypertension    no longer takes meds   Migraines    Mild concentric left ventricular hypertrophy (LVH) 08/12/2020   Echo (07/2020):  1. Left ventricular ejection fraction, by estimation, is 45 to 50%. The  left ventricle has mildly decreased function. The left ventricle  demonstrates global hypokinesis. There is mild asymmetric left ventricular  hypertrophy of the basal-septal segment.  The interventricular septum is flattened in systole, consistent with right  ventricular pressure overload.   2. Right ve   Muscle spasm    Ovarian cyst    Pelvic pain in female 09/29/2015   Rectal bleeding 12/09/2020   FHx:   Father: colon cancer (94 yo)  Paternal aunt: colon cancer (~41 yo), breast cancer (unknown age).  Paternal gma: Breast cancer   Paternal 2nd cousin: Ovarian cancer     Colonoscopy (07/2020):  Perianal and digital rectal examinations: normal.  Findings:  - A patchy area of moderately nodular mucosa was found at the hepatic flexure and in the ascending colon.  - A patchy area of moderately no   Scoliosis    Thrombocytosis 08/12/2020   Uterine fibroid    Vulvovaginal candidiasis 05/15/2018     ROS:  Negative unless otherwise  stated.   Assessment / Plan / Recommendations:  Chest pain Patient explains that over the last 5 days she has had chest and lung pain that is sharp, intermittent, and with radiation to the L side of her body including her neck, arm, and flank. She thinks she has a knot on the back of her shoulder as well. There are no specific inciting events for the pain but notes it happens more when laying down than with activity. She is also malaised, fatigued, and has body aches. No measured fevers but endorses alternating between hot and cold. She has experienced the chest pain without lung pain as well and it radiates to the L side of her body. She has pain in her lungs when coughing but no trouble breathing. She is worried that she has pneumonia again. She has tried taking ibuprofen  800 mg q4h for the pain and nyquil for the cough without relief. She denies known sick contacts, however please note she was diagnosed with Influenza A 03/27, and states that her symptoms at that time are consistent with those she has now and that they have gotten worse over this time. She has not had to use her inhalers more often than normal.  Assessment: Difficult to assess adequately over the phone. Several concerning symptoms including chest pain that is worse with laying down (post-viral pericarditis?), chest pain separate from lung/cough pain (unstable angina?), possibility for post-viral bacterial pneumonia. Plan:I have advised the patient to be seen at  either an urgent care or the ED for further evaluation. She voices understanding and agreement to this plan.  As always, pt is advised that if symptoms worsen or new symptoms arise, they should go to an urgent care facility or to to ER for further evaluation.   Consent and Medical Decision Making:  Patient discussed with Dr.  Lanetta Pion This is a telephone encounter between RINNAH PEPPEL and Malen Scudder on 08/12/2023 for chest pain, generally feeling poorly. The visit was  conducted with the patient located at home and Malen Scudder at [facility]. The patient's identity was confirmed using their DOB and current address. The patient has consented to being evaluated through a telephone encounter and understands the associated risks (an examination cannot be done and the patient may need to come in for an appointment) / benefits (allows the patient to remain at home, decreasing exposure to coronavirus). I personally spent 20 minutes on medical discussion.

## 2023-08-12 NOTE — Telephone Encounter (Signed)
 I called pt- stated she's very tired, chest hurts when coughing. She took cough med and Ibuprofen ; and used arthritis med for the neck,shoulder, and arm pain. Unable to come in today d/t no transportation/money. Scheduled pt for a telehealth appt this am @ 1045 Am w/Dr Rozelle Corning.

## 2023-08-12 NOTE — Patient Instructions (Signed)
 Ms. Huesman,  I'm sorry you are not feeling well. As we discussed, I recommend that you go to the urgent care or emergency department for further evaluation.  My best, Dr. Rozelle Corning

## 2023-08-12 NOTE — Assessment & Plan Note (Signed)
 Patient explains that over the last 5 days she has had chest and lung pain that is sharp, intermittent, and with radiation to the L side of her body including her neck, arm, and flank. She thinks she has a knot on the back of her shoulder as well. There are no specific inciting events for the pain but notes it happens more when laying down than with activity. She is also malaised, fatigued, and has body aches. No measured fevers but endorses alternating between hot and cold. She has experienced the chest pain without lung pain as well and it radiates to the L side of her body. She has pain in her lungs when coughing but no trouble breathing. She is worried that she has pneumonia again. She has tried taking ibuprofen  800 mg q4h for the pain and nyquil for the cough without relief. She denies known sick contacts, however please note she was diagnosed with Influenza A 03/27, and states that her symptoms at that time are consistent with those she has now and that they have gotten worse over this time. She has not had to use her inhalers more often than normal.  Assessment: Difficult to assess adequately over the phone. Several concerning symptoms including chest pain that is worse with laying down (post-viral pericarditis?), chest pain separate from lung/cough pain (unstable angina?), possibility for post-viral bacterial pneumonia. Plan:I have advised the patient to be seen at either an urgent care or the ED for further evaluation. She voices understanding and agreement to this plan.

## 2023-08-13 ENCOUNTER — Institutional Professional Consult (permissible substitution): Payer: MEDICAID | Admitting: Plastic Surgery

## 2023-08-13 ENCOUNTER — Other Ambulatory Visit: Payer: Self-pay

## 2023-08-13 NOTE — Progress Notes (Signed)
 Internal Medicine Clinic Attending  Case discussed with the resident at the time of the visit.  We reviewed the resident's history and exam and pertinent patient test results.  I agree with the assessment, diagnosis, and plan of care documented in the resident's note.

## 2023-08-16 ENCOUNTER — Ambulatory Visit: Payer: MEDICAID | Admitting: Orthopaedic Surgery

## 2023-08-20 ENCOUNTER — Other Ambulatory Visit: Payer: Self-pay

## 2023-08-21 ENCOUNTER — Other Ambulatory Visit: Payer: Self-pay

## 2023-08-27 ENCOUNTER — Other Ambulatory Visit: Payer: Self-pay

## 2023-08-28 NOTE — Progress Notes (Signed)
 HPI F former smoker with question of sleep disorder Medical problem list includes Migraine, HTN, PulmonaryEmbolism/ 4/24/  Repeat PE 04/2023/ Eliquis , Asthma, DM2, Anxiety/ Depression, Endometriosis, FEAnemia,  NPSG- 01/06/23- WNL- AHI 1.5/hr, desat to 88 ========================================================================================================  11/22/22- 41 yoF former smoker for sleep evaluation    Patient pregnant Medical problem list includes Migraine, HTN, hx PulmonaryEmbolism/ Lovenox4/24/ , Asthma, DM2, Anxiety/ Depression, Endometriosis, FEAnemia,  Epworth score-22 Body weight-215 lbs Aware of snoring. Frequent sleep walking x 5 years. No injuries. Usually gets up, goes to kitchen for water, then back to bed. No ENT surgery. Several family with OSA. Brother on CPAP. Anxiety meds help sleep. Occ AM coffee. Bedtime 1:30-2:30 AM. Up 5-6 times to bathroom, sometimes unaware. Occ palpitation. Ovcc sharp pains in anterior chest wall. Has just learned that she is pregnant again. CTaChest PE 08/12/22->> IR Thrombolytic  IMPRESSION: 1. Marked severity bilateral pulmonary embolism with right heart strain (RV/LV Ratio = 1.31) which has been associated with an increased risk of morbidity and mortality. 2. Mild patchy right upper lobe and left lower lobe infiltrates. 3. Evidence of prior cholecystectomy  08/29/23- 42 yoF former smoker with question of sleep disorder Medical problem list includes Migraine, HTN, PulmonaryEmbolism/ 4/24/  Repeat PE 04/2023/ Eliquis , Asthma, DM2, Anxiety/ Depression, Endometriosis, FEAnemia, -albuterol  hfa,Dulera  200  NPSG- 01/06/23- WNL- AHI 1.5/hr, desat to 88 Body weight today-201 lbs Discussed the use of AI scribe software for clinical note transcription with the patient, who gave verbal consent to proceed.  History of Present Illness   Dana Hughes is a 43 year old female with recurrent pulmonary emboli who presents with numbness in her leg and  breathing difficulties during sleep.  She experiences breathing difficulties during sleep, characterized by apnea and gasping. Her boyfriend observes these episodes and pokes her, noting that she does not snore much unless very tired. A sleep study conducted last fall was normal, showing no sleep apnea, yet she continues to experience nocturnal breathing difficulties. She does not take any medication to aid sleep.  She has asthma and uses albuterol  and Dulera  inhalers. Her current breathing difficulties differ from  asthma symptoms. She has experienced chest tightness, dyspnea, and lightheadedness at night, . These episodes require her to take measures such as cold showers and using ice to alleviate symptoms. She describes an episode of R leg numbness lasting for a day.   She has had at least 2 separate pulmonary emboli and is currently on Eliquis . I recommended referral for coagulopathy eval by Hematology.     Assessment and Plan:    Asthma- mild intermittent uncomplicated Asthma with current symptoms not indicative of exacerbation. Albuterol  and Dulera  inhalers available.  Recurrent pulmonary embolism Recurrent pulmonary embolism, most recently in January. Currently on Eliquis . Recent transient leg numbness raises concern for vascular or neurological event. Referral to hematology necessary for evaluation of potential clotting disorders. - Refer to hematology for evaluation of recurrent blood clots and potential underlying clotting disorder.  Sleep Disordered Breathing Nocturnal symptoms suggestive of sleep apnea, including gasping for air and observed apneas. Previous sleep study normal, but symptoms persist. - Order home sleep test to evaluate for sleep apnea.    Obesity - encourage diet/ exercise. -Discuss with PCP  CXR 07/18/23 IMPRESSION: No active cardiopulmonary disease. CTa PE 05/01/23 IMPRESSION: 1. Pulmonary emboli are seen throughout segmental and subsegmental branches of the  right lower lobe. Web-like pulmonary embolism in the distal left lower lobe pulmonary artery. No evidence for right heart strain. 2. Minimal  patchy airspace and ground-glass opacities in the right lower lobe worrisome for infarct. 3. Air trapping in the bilateral lower lungs worrisome for small airways disease.    ROS-see HPI   + = positive Constitutional:    weight loss, night sweats, fevers, chills, fatigue, lassitude. HEENT:    headaches, difficulty swallowing, tooth/dental problems, sore throat,       sneezing, itching, ear ache, nasal congestion, post nasal drip, snoring CV:    chest pain, orthopnea, PND, swelling in lower extremities, anasarca,                                   dizziness, palpitations Resp:   shortness of breath with exertion or at rest.                productive cough,   non-productive cough, coughing up of blood.              change in color of mucus.  wheezing.   Skin:    rash or lesions. GI:  No-   heartburn, indigestion, abdominal pain, nausea, vomiting, diarrhea,                 change in bowel habits, loss of appetite GU: dysuria, change in color of urine, no urgency or frequency.   flank pain. MS:   joint pain, stiffness, decreased range of motion, back pain. Neuro-     nothing unusual Psych:  change in mood or affect.  +depression or +anxiety.   memory loss.  OBJ- Physical Exam General- Alert, Oriented, Affect-appropriate, Distress- none acute, +obese Skin- rash-none, lesions- none, excoriation- none, +tatoos, +stretch marks Lymphadenopathy- none Head- atraumatic            Eyes- Gross vision intact, PERRLA, conjunctivae and secretions clear            Ears- Hearing, canals-normal            Nose- +snorting, no-Septal dev, mucus, polyps, erosion, perforation             Throat- Mallampati III , +tongue stud , drainage- none, tonsils- atrophic, +teeth Neck- flexible , trachea midline, no stridor , thyroid  nl, carotid no bruit Chest - symmetrical  excursion , unlabored           Heart/CV- RRR , no murmur , no gallop  , no rub, nl s1 s2                           - JVD- none , edema- none, stasis changes- none, varices- none           Lung- clear to P&A, wheeze- none, cough- none , dullness-none, rub- none           Chest wall-  Abd-  Br/ Gen/ Rectal- Not done, not indicated Extrem- cyanosis- none, clubbing, none, atrophy- none, strength- nl Neuro- grossly intact to observation  .

## 2023-08-29 ENCOUNTER — Ambulatory Visit: Payer: MEDICAID | Admitting: Obstetrics and Gynecology

## 2023-08-29 ENCOUNTER — Ambulatory Visit: Payer: MEDICAID | Admitting: Internal Medicine

## 2023-08-29 ENCOUNTER — Encounter: Payer: Self-pay | Admitting: Internal Medicine

## 2023-08-29 ENCOUNTER — Encounter (HOSPITAL_COMMUNITY): Payer: Self-pay

## 2023-08-29 VITALS — BP 138/90 | HR 103 | Temp 98.3°F | Ht 63.5 in | Wt 201.6 lb

## 2023-08-29 DIAGNOSIS — I2782 Chronic pulmonary embolism: Secondary | ICD-10-CM

## 2023-08-29 DIAGNOSIS — D689 Coagulation defect, unspecified: Secondary | ICD-10-CM

## 2023-08-29 DIAGNOSIS — R0681 Apnea, not elsewhere classified: Secondary | ICD-10-CM | POA: Diagnosis not present

## 2023-08-29 DIAGNOSIS — Z87891 Personal history of nicotine dependence: Secondary | ICD-10-CM | POA: Diagnosis not present

## 2023-08-29 NOTE — Progress Notes (Deleted)
 GYNECOLOGY VISIT  Patient name: Dana Hughes MRN [id number]  Date of birth: 10-12-1980 Chief Complaint:   No chief complaint on file.   History:  JHANIA TRENCHARD is a 43 y.o. [phone] being seen today for ***.    Seen by me 11/2022: irregular bleeding and intermittent sharp abdominal pain, positive UPT with later negative bHCG  Past Medical History:  Diagnosis Date   Anxiety    Arthritis    Asthma    BPPV (benign paroxysmal positional vertigo) 05/18/2022   Bronchitis    BV (bacterial vaginosis)    Candidiasis    Carpal tunnel syndrome, bilateral 01/16/2019   Chest pain of uncertain etiology 07/18/2021   Constipation 12/10/2020   Cystocele without uterine prolapse    Depression    Diabetes mellitus without complication (HCC)    Endometriosis    Exertional dyspnea 06/24/2021   Headache(784.0)    migraines   Hypertension    no longer takes meds   Migraines    Mild concentric left ventricular hypertrophy (LVH) 08/12/2020   Echo (07/2020):  1. Left ventricular ejection fraction, by estimation, is 45 to 50%. The  left ventricle has mildly decreased function. The left ventricle  demonstrates global hypokinesis. There is mild asymmetric left ventricular  hypertrophy of the basal-septal segment.  The interventricular septum is flattened in systole, consistent with right  ventricular pressure overload.   2. Right ve   Muscle spasm    Ovarian cyst    Pelvic pain in female 09/29/2015   Rectal bleeding 12/09/2020   FHx:   Father: colon cancer (58 yo)  Paternal aunt: colon cancer (~22 yo), breast cancer (unknown age).  Paternal gma: Breast cancer   Paternal 2nd cousin: Ovarian cancer     Colonoscopy (07/2020):  Perianal and digital rectal examinations: normal.  Findings:  - A patchy area of moderately nodular mucosa was found at the hepatic flexure and in the ascending colon.  - A patchy area of moderately no   Scoliosis    Thrombocytosis 08/12/2020   Uterine fibroid     Vulvovaginal candidiasis 05/15/2018    Past Surgical History:  Procedure Laterality Date   BIOPSY  08/12/2020   Procedure: BIOPSY;  Surgeon: Sergio Dandy, MD;  Location: [facility];  Service: Endoscopy;;   CHOLECYSTECTOMY     COLONOSCOPY WITH PROPOFOL  N/A 08/12/2020   Procedure: COLONOSCOPY WITH PROPOFOL ;  Surgeon: Sergio Dandy, MD;  Location: [facility];  Service: Endoscopy;  Laterality: N/A;   ESOPHAGOGASTRODUODENOSCOPY (EGD) WITH PROPOFOL  N/A 08/12/2020   Procedure: ESOPHAGOGASTRODUODENOSCOPY (EGD) WITH PROPOFOL ;  Surgeon: Sergio Dandy, MD;  Location: [facility];  Service: Endoscopy;  Laterality: N/A;   IR ANGIOGRAM PULMONARY BILATERAL SELECTIVE  08/22/2022   IR ANGIOGRAM SELECTIVE EACH ADDITIONAL VESSEL  08/22/2022   IR ANGIOGRAM SELECTIVE EACH ADDITIONAL VESSEL  08/22/2022   IR INFUSION THROMBOL ARTERIAL INITIAL (MS)  08/13/2022   IR THROMB F/U EVAL ART/VEN FINAL DAY (MS)  08/14/2022   IR US  GUIDE VASC ACCESS RIGHT  08/13/2022   POLYPECTOMY  08/12/2020   Procedure: POLYPECTOMY;  Surgeon: Sergio Dandy, MD;  Location: [facility];  Service: Endoscopy;;   TUBAL LIGATION      The following portions of the patient's history were reviewed and updated as appropriate: allergies, current medications, past family history, past medical history, past social history, past surgical history and problem list.   Health Maintenance:   Last pap     Component Value Date/Time   DIAGPAP  05/05/2020 1416    - Negative for intraepithelial lesion or malignancy (NILM)   DIAGPAP  03/21/2017 0000    NEGATIVE FOR INTRAEPITHELIAL LESIONS OR MALIGNANCY.   HPVHIGH Negative 05/05/2020 1416   ADEQPAP  05/05/2020 1416    Satisfactory for evaluation; transformation zone component PRESENT.   ADEQPAP  03/21/2017 0000    Satisfactory for evaluation  endocervical/transformation zone component PRESENT.    High Risk HPV: Positive  Adequacy:  Satisfactory for evaluation, transformation zone  component PRESENT  Diagnosis:  Atypical squamous cells of undetermined significance (ASC-US )  Last mammogram: 08/2022 BIRADS 1   Review of Systems:  {Ros - complete:30496} Comprehensive review of systems was otherwise negative.   Objective:  Physical Exam There were no vitals taken for this visit.   Physical Exam   Labs and Imaging No results found.     Assessment & Plan:   There are no diagnoses linked to this encounter.   *** Routine preventative health maintenance measures emphasized.  Kiki Pelton, MD [facility] for [facility], [facility]

## 2023-08-29 NOTE — Patient Instructions (Signed)
 Order- schedule home sleep test   dx witnessed apnea  Pleasse call about 2 weeks after your sleep test for results and recommendations.  Order- referral to Hematology   dx recurrent pulmonary embolism for coagulopathy assessment  Let your primary care providers know about the numbness episode in your leg, especially if it happens again

## 2023-08-30 ENCOUNTER — Ambulatory Visit: Payer: Self-pay

## 2023-08-30 ENCOUNTER — Other Ambulatory Visit: Payer: Self-pay | Admitting: Obstetrics and Gynecology

## 2023-08-30 ENCOUNTER — Telehealth: Payer: Self-pay | Admitting: *Deleted

## 2023-08-30 DIAGNOSIS — Z1231 Encounter for screening mammogram for malignant neoplasm of breast: Secondary | ICD-10-CM

## 2023-08-30 NOTE — Telephone Encounter (Signed)
 I called pt to discuss her issues - no answer; left message to call the office.

## 2023-08-30 NOTE — Telephone Encounter (Signed)
 Please read message below.Thanks,Charsetta----- Message -----From: Stuart Ellis MSent: 08/30/2023   2:10 AM EDTTo: Imp AdminSubject: Appointment Request                          Appointment Request From: Ted Favor FultonWith Provider: Cathey Clunes [facility] - A Dept Of [facility]]Preferred Date Range: 09/05/2023 - 5/16/2025Preferred Times: Any TimeReason for visit: Office VisitHealth Maintenance Topic:Comments:Dr.clinton said I should tell y'all about my experience with my right side from my hip down to my toes was numbness and no feeling in the whole leg and my foot for two days basically over night but now it's looking swollen in my right calf a little numbness dr. Rupert Counts said to have it ultrasound checked for blood clots my eye vision has been blurry and can barely see at times or reading things I'm still having migraines off and on through out the day worse at night heavy breathing at times.

## 2023-08-30 NOTE — Telephone Encounter (Signed)
  Chief Complaint: leg pain and numbness Symptoms: pain, tingling Frequency: intermittent Pertinent Negatives: Patient denies chest pain, shortness of breath, injuries, vision changes, weakness Disposition: [] ED /[x] Urgent Care (no appt availability in office) / [] Appointment(In office/virtual)/ []  [facility]/ [] Home Care/ [] Refused Recommended Disposition /[] [facility]/ []  Follow-up with PCP Additional Notes:   Right leg very painful and feels warm to touch. Leg pain is 8/10. Pain from hip to toes. This has been ongoing for two days. She has been having chills. She has been having having intermittent headaches and neck pain. Denies shortness of breath and chest pain, no vision changes at this time. Offered next and only available acute visit in practice at 10:45 today but she is not able to make this in time, will proceed to urgent care for evaluation. Educated on care advice as documented in protocol, patient verbalized understanding. Discussed reasons to call back.     Copied from CRM [id number]. Topic: Clinical - Red Word Triage >> Aug 30, 2023  9:52 AM Brynn Caras wrote: Red Word that prompted transfer to Nurse Triage: The patient is experiencing numbness on the right side of her hip shooting down to her toes, described as having no feeling in her whole right leg/foot for over two nights. PT states her vision is blurry/trouble breathing, and persistent migrane headaches. Reason for Disposition  [1] Thigh or calf pain AND [2] only 1 side AND [3] present > 1 hour (Exception: Chronic unchanged pain.)  Protocols used: Leg Pain-A-AH

## 2023-08-30 NOTE — Telephone Encounter (Signed)
 See telephone encounter from E2C2.

## 2023-09-03 ENCOUNTER — Ambulatory Visit: Payer: MEDICAID | Admitting: Orthopaedic Surgery

## 2023-09-03 ENCOUNTER — Institutional Professional Consult (permissible substitution): Payer: MEDICAID | Admitting: Plastic Surgery

## 2023-09-05 DIAGNOSIS — Z1231 Encounter for screening mammogram for malignant neoplasm of breast: Secondary | ICD-10-CM

## 2023-09-09 ENCOUNTER — Encounter: Payer: MEDICAID | Admitting: Student

## 2023-09-11 ENCOUNTER — Telehealth: Payer: Self-pay | Admitting: Surgical

## 2023-09-11 ENCOUNTER — Telehealth: Payer: Self-pay | Admitting: *Deleted

## 2023-09-11 ENCOUNTER — Other Ambulatory Visit (HOSPITAL_COMMUNITY): Payer: Self-pay

## 2023-09-11 ENCOUNTER — Ambulatory Visit (INDEPENDENT_AMBULATORY_CARE_PROVIDER_SITE_OTHER): Payer: MEDICAID | Admitting: Student

## 2023-09-11 ENCOUNTER — Other Ambulatory Visit: Payer: Self-pay

## 2023-09-11 ENCOUNTER — Other Ambulatory Visit (HOSPITAL_COMMUNITY)
Admission: RE | Admit: 2023-09-11 | Discharge: 2023-09-11 | Disposition: A | Discharge status: Home / Self Care | Payer: MEDICAID | Source: Ambulatory Visit | Attending: Internal Medicine | Admitting: Internal Medicine

## 2023-09-11 VITALS — BP 136/89 | HR 100 | Ht 63.5 in | Wt 198.0 lb

## 2023-09-11 DIAGNOSIS — B3731 Acute candidiasis of vulva and vagina: Secondary | ICD-10-CM | POA: Insufficient documentation

## 2023-09-11 DIAGNOSIS — G43009 Migraine without aura, not intractable, without status migrainosus: Secondary | ICD-10-CM

## 2023-09-11 DIAGNOSIS — Z794 Long term (current) use of insulin: Secondary | ICD-10-CM | POA: Diagnosis not present

## 2023-09-11 DIAGNOSIS — E119 Type 2 diabetes mellitus without complications: Secondary | ICD-10-CM | POA: Diagnosis not present

## 2023-09-11 DIAGNOSIS — Z7984 Long term (current) use of oral hypoglycemic drugs: Secondary | ICD-10-CM

## 2023-09-11 DIAGNOSIS — G43909 Migraine, unspecified, not intractable, without status migrainosus: Secondary | ICD-10-CM

## 2023-09-11 MED ORDER — LANTUS SOLOSTAR 100 UNIT/ML ~~LOC~~ SOPN
26.0000 [IU] | PEN_INJECTOR | Freq: Every day | SUBCUTANEOUS | 3 refills | Status: DC
  Filled 2023-09-11 (×2): qty 15, 57d supply, fill #0

## 2023-09-11 MED ORDER — FLUCONAZOLE 100 MG PO TABS
100.0000 mg | ORAL_TABLET | Freq: Every day | ORAL | 0 refills | Status: DC
  Filled 2023-09-11 (×2): qty 7, 7d supply, fill #0

## 2023-09-11 MED ORDER — SUMATRIPTAN SUCCINATE 50 MG PO TABS
50.0000 mg | ORAL_TABLET | ORAL | 3 refills | Status: DC | PRN
  Filled 2023-09-11: qty 9, 30d supply, fill #0
  Filled 2023-09-11: qty 30, 15d supply, fill #0
  Filled 2023-10-22: qty 9, 30d supply, fill #1

## 2023-09-11 NOTE — Progress Notes (Signed)
 CC: Acute visit for migraines and cough.  HPI:  [name] is a 43 y.o. female living with a history stated below and presents today for migraines and a cough..   Please see problem based assessment and plan for additional details.  Patient was last seen on 11/24. Past Medical History:  Diagnosis Date   Anxiety    Arthritis    Asthma    BPPV (benign paroxysmal positional vertigo) 05/18/2022   Bronchitis    BV (bacterial vaginosis)    Candidiasis    Carpal tunnel syndrome, bilateral 01/16/2019   Chest pain of uncertain etiology 07/18/2021   Constipation 12/10/2020   Cystocele without uterine prolapse    Depression    Diabetes mellitus without complication (HCC)    Endometriosis    Exertional dyspnea 06/24/2021   Headache(784.0)    migraines   Hypertension    no longer takes meds   Migraines    Mild concentric left ventricular hypertrophy (LVH) 08/12/2020   Echo (07/2020):  1. Left ventricular ejection fraction, by estimation, is 45 to 50%. The  left ventricle has mildly decreased function. The left ventricle  demonstrates global hypokinesis. There is mild asymmetric left ventricular  hypertrophy of the basal-septal segment.  The interventricular septum is flattened in systole, consistent with right  ventricular pressure overload.   2. Right ve   Muscle spasm    Ovarian cyst    Pelvic pain in female 09/29/2015   Rectal bleeding 12/09/2020   FHx:   Father: colon cancer (75 yo)  Paternal aunt: colon cancer (~10 yo), breast cancer (unknown age).  Paternal gma: Breast cancer   Paternal 2nd cousin: Ovarian cancer     Colonoscopy (07/2020):  Perianal and digital rectal examinations: normal.  Findings:  - A patchy area of moderately nodular mucosa was found at the hepatic flexure and in the ascending colon.  - A patchy area of moderately no   Scoliosis    Thrombocytosis 08/12/2020   Uterine fibroid    Vulvovaginal candidiasis 05/15/2018    Current Outpatient  Medications on File Prior to Visit  Medication Sig Dispense Refill   acetaminophen  (TYLENOL ) 500 MG tablet Take 2 tablets (1,000 mg total) by mouth every 6 (six) hours as needed for mild pain (or Fever >/= 101). 30 tablet 0   albuterol  (PROVENTIL ) (2.5 MG/3ML) 0.083% nebulizer solution Take 3 mLs (2.5 mg total) by nebulization every 6 (six) hours as needed for wheezing or shortness of breath. 75 mL 0   albuterol  (VENTOLIN  HFA) 108 (90 Base) MCG/ACT inhaler Inhale 2 puffs into the lungs every 6 (six) hours as needed for wheezing or shortness of breath. Reported on 09/29/2015     amLODipine  (NORVASC ) 5 MG tablet Take 1 tablet (5 mg total) by mouth daily. 90 tablet 3   apixaban  (ELIQUIS ) 5 MG TABS tablet Take 1 tablet (5 mg total) by mouth 2 (two) times daily. 60 tablet 2   ARIPiprazole  (ABILIFY ) 20 MG tablet Take 1 tablet (20 mg total) by mouth daily. (Patient taking differently: Take 20 mg by mouth at bedtime.) 30 tablet 3   Cholecalciferol  125 MCG (5000 UT) capsule Take 1 capsule (5,000 Units total) by mouth daily. 30 capsule 2   dicyclomine  (BENTYL ) 20 MG tablet Take 1 tablet (20 mg total) by mouth 3 (three) times daily as needed for spasms. 90 tablet 3   furosemide  (LASIX ) 20 MG tablet Take 2 tablets (40 mg total) by mouth daily. 60 tablet 0   gabapentin  (  NEURONTIN ) 300 MG capsule Take 1 capsule (300 mg total) by mouth 3 (three) times daily. (Patient taking differently: Take 300 mg by mouth 2 (two) times daily.) 90 capsule 3   insulin  aspart (NOVOLOG ) 100 UNIT/ML FlexPen Inject 10 Units into the skin 2 (two) times daily with a meal. 6 mL 0   lamoTRIgine  (LAMICTAL ) 150 MG tablet Take 1 tablet (150 mg total) by mouth daily. 30 tablet 3   lidocaine  (HM LIDOCAINE  PATCH) 4 % Place 1 patch onto the skin daily as needed. 6 patch 6   lidocaine  (XYLOCAINE ) 5 % ointment Apply 1 Application topically as needed. 35.44 g 0   metFORMIN  (GLUCOPHAGE ) 1000 MG tablet Take 1 tablet (1,000 mg total) by mouth 2 (two)  times daily with a meal. 90 tablet 3   mirtazapine  (REMERON ) 45 MG tablet Take 1 tablet (45 mg total) by mouth at bedtime. 30 tablet 3   mometasone -formoterol  (DULERA ) 200-5 MCG/ACT AERO Inhale 2 puffs into the lungs 2 (two) times daily. 13 g 2   omeprazole  (PRILOSEC) 40 MG capsule Take 1 capsule (40 mg total) by mouth daily. 30 capsule 1   Tasimelteon  (HETLIOZ ) 20 MG CAPS Take 20 mg by mouth at bedtime. 30 capsule 3   No current facility-administered medications on file prior to visit.   Review of Systems: ROS negative except for what is noted on the assessment and plan.  Vitals:   09/11/23 1402  BP: 136/89  Pulse: 100  SpO2: 100%  Weight: 198 lb (89.8 kg)  Height: 5' 3.5" (1.613 m)    Physical Exam: Constitutional: NAD. Eye exam: Pupils equal and reactive to light.  Cardiovascular: regular rate and rhythm, no m/r/g Pulmonary/Chest: normal work of breathing on room air, lungs clear to auscultation bilaterally Neuro: Normal cranial exams, no focal weakness.  Assessment & Plan:   Patient discussed with Dr. Lanetta Pion  Migraine headaches She endorses left-sided unilateral pounding headaches, episodes last between 4-6 hours.  She endorses increased sensitivity to sound and light during migraine episodes.  She has been having almost 3-4 episodes of migraines every week. She was previously prescribed sumatriptan , however she has not been taking the medicine.  She has both normal eye and neuroexam.  Will treat her migraine with the sumatriptan  50 mg.   Plan: - Will treat migraine with sumatriptan  50 mg.  Type 2 diabetes Uncontrolled type 2 diabetes, A1c 1 month ago was 11.2.  She was out of refills for insulin  about a week ago.  Her sugars has been 250-300, no hypoglycemic episodes. -Will increase Lantus  to 26 units at bedtime. -Continue with metformin  1000 mg twice daily.  Vulvovaginal candidiasis. She is reporting 3  days history of vaginal pruritus and sticky vaginal discharge,  without odor.  She has increased urinary frequency, no dysuria.  At home, she tried topical miconazole , however her symptoms has not improved.  I think patient's symptoms consistent with yeast infection, she has risk factors including uncontrolled type 2 diabetes, and prior history of yeast infection.  Plan: - Will do a self swab and test for Candida, BV, and chlamydia. - Oral fluconazole  100 mg for 7 days.   Marni Sins, MD [facility], PGY-1 Pager: [phone] Date 09/13/2023 Time 3:48 PM

## 2023-09-11 NOTE — Telephone Encounter (Signed)
 Pt has arrived  Name: Dana Hughes, Berch MRN: [id number]  Date: 09/11/2023 Status: Arrived  Time: 1:45 PM Length: 30  Visit Type: OPEN ESTABLISHED [726] Copay: $0.00  Provider: Marni Sins, MD      Copied from CRM [id number]. Topic: General - Running Late >> Sep 11, 2023  1:40 PM Brittney F wrote: Patient/patient representative is calling because they are running late for an appointment. Patient is currently at the hospital and getting her car back from valet and will head across the street.

## 2023-09-11 NOTE — Telephone Encounter (Signed)
 error

## 2023-09-11 NOTE — Telephone Encounter (Signed)
 See msg, tried to reschedule

## 2023-09-11 NOTE — Telephone Encounter (Signed)
 Call to patient to ask about her Chest Pain .  To see if she has a fever and to see if she should go to the ER versus a doctor visit at the Clinics.  No answer.  Message left that the Clinics had called.

## 2023-09-11 NOTE — Patient Instructions (Addendum)
 It was a pleasure taking care of you today!    Please increase your Lantus  to 26 units at bedtime.  Take metformin  2 tablets of metformin  500 mg twice a day.  2.  Please take sumatriptan  50 mg for your migraines.  3.  Please follow-up with your therapist for an appointment.  4.  I will send a medicine oral fluconazole  for the candidiasis.  I have ordered the following labs for you:  Lab Orders  No laboratory test(s) ordered today      Follow up: 4 Weeks   Should you have any questions or concerns please call the internal medicine clinic at [phone].     Marni Sins, MD  [facility]

## 2023-09-13 ENCOUNTER — Ambulatory Visit: Payer: MEDICAID

## 2023-09-13 ENCOUNTER — Other Ambulatory Visit: Payer: Self-pay

## 2023-09-13 DIAGNOSIS — R0681 Apnea, not elsewhere classified: Secondary | ICD-10-CM

## 2023-09-13 LAB — CERVICOVAGINAL ANCILLARY ONLY
Bacterial Vaginitis (gardnerella): POSITIVE — AB
Candida Glabrata: POSITIVE — AB
Candida Vaginitis: POSITIVE — AB
Chlamydia: NEGATIVE
Comment: NEGATIVE
Comment: NEGATIVE
Comment: NEGATIVE
Comment: NEGATIVE
Comment: NEGATIVE
Comment: NORMAL
Neisseria Gonorrhea: NEGATIVE
Trichomonas: NEGATIVE

## 2023-09-14 ENCOUNTER — Other Ambulatory Visit: Payer: Self-pay

## 2023-09-14 ENCOUNTER — Ambulatory Visit: Payer: Self-pay | Admitting: Student

## 2023-09-14 ENCOUNTER — Encounter (HOSPITAL_COMMUNITY): Payer: Self-pay

## 2023-09-14 ENCOUNTER — Ambulatory Visit (HOSPITAL_COMMUNITY)
Admission: EM | Admit: 2023-09-14 | Discharge: 2023-09-14 | Disposition: A | Discharge status: Home / Self Care | Payer: MEDICAID | Attending: Family Medicine | Admitting: Family Medicine

## 2023-09-14 DIAGNOSIS — B9689 Other specified bacterial agents as the cause of diseases classified elsewhere: Secondary | ICD-10-CM | POA: Diagnosis not present

## 2023-09-14 DIAGNOSIS — N760 Acute vaginitis: Secondary | ICD-10-CM

## 2023-09-14 DIAGNOSIS — Z3202 Encounter for pregnancy test, result negative: Secondary | ICD-10-CM

## 2023-09-14 DIAGNOSIS — B3731 Acute candidiasis of vulva and vagina: Secondary | ICD-10-CM | POA: Diagnosis not present

## 2023-09-14 DIAGNOSIS — M545 Low back pain, unspecified: Secondary | ICD-10-CM

## 2023-09-14 LAB — POCT URINALYSIS DIP (MANUAL ENTRY)
Bilirubin, UA: NEGATIVE
Glucose, UA: >=1000 mg/dL — AB
Leukocytes, UA: NEGATIVE
Nitrite, UA: NEGATIVE
Protein Ur, POC: NEGATIVE mg/dL
Spec Grav, UA: 1.02
Urobilinogen, UA: 0.2 U/dL
pH, UA: 5.5

## 2023-09-14 LAB — POCT URINE PREGNANCY: Preg Test, Ur: NEGATIVE

## 2023-09-14 MED ORDER — METRONIDAZOLE 500 MG PO TABS
500.0000 mg | ORAL_TABLET | Freq: Two times a day (BID) | ORAL | 0 refills | Status: DC
  Filled 2023-09-14: qty 14, 7d supply, fill #0

## 2023-09-14 MED ORDER — FLUCONAZOLE 150 MG PO TABS
150.0000 mg | ORAL_TABLET | Freq: Every day | ORAL | 0 refills | Status: DC
  Filled 2023-09-14: qty 2, 4d supply, fill #0

## 2023-09-14 MED ORDER — BACLOFEN 20 MG PO TABS
20.0000 mg | ORAL_TABLET | Freq: Three times a day (TID) | ORAL | 0 refills | Status: AC
  Filled 2023-09-14: qty 30, 10d supply, fill #0

## 2023-09-14 NOTE — Discharge Instructions (Signed)
  1. Acute right-sided low back pain without sciatica (Primary) - POCT urinalysis dipstick performed in UC is positive for trace blood, negative for nitrite and leukocytes, no significant sign of urinary tract infection.  Patient does have severely elevated glucose with known history of diabetes. - baclofen (LIORESAL) 20 MG tablet; Take 1 tablet (20 mg total) by mouth 3 (three) times daily.  Dispense: 30 each; Refill: 0  2. BV (bacterial vaginosis) - POCT urine pregnancy performed in UC is negative - metroNIDAZOLE  (FLAGYL ) 500 MG tablet; Take 1 tablet (500 mg total) by mouth 2 (two) times daily.  Dispense: 14 tablet; Refill: 0  3. Vaginal candida - fluconazole  (DIFLUCAN ) 150 MG tablet; Take 1 tablet (150 mg total) by mouth daily. If symptoms persist take second dose of Diflucan  150 mg on day 3 to treat vaginal Candida.  Dispense: 2 tablet; Refill: 0

## 2023-09-14 NOTE — Progress Notes (Signed)
 Vaginal swab was positive for Candida vaginitis, Candida Glabrata and bacterial vaginosis.  Will treat Candida with fluconazole  for 7 days.  Since she is endorsing fishy odor smell, will treat BV with metronidazole .    Patient was encouraged to call the clinic if the symptoms does not improve after finishing both fluconazole  and metronidazole .  We could consider treating Candida glabrata with vaginal boric acid.  Patient verbalized understanding.

## 2023-09-14 NOTE — ED Triage Notes (Signed)
 Pt presents with complaints of right-sided back pain x 4 days. Pt currently rates her overall back pain a 10/10. Pt went to primary care on 5/21, did a vaginal swab however the patient is unsure of results. Office did not call her back. Based on results, positive for BV. Pt states her urine is "fizzy." OTC Aleve  + Ibuprofen  taken with no relief. Monistat  vaginal cream applied as well.   Pt is also reporting dry cough for about 4-5 weeks, worse at nighttime. OTC Nyquil + Dayquil taken with no improvement.

## 2023-09-14 NOTE — ED Provider Notes (Addendum)
 [facility] [place]  Note:  This document was prepared using Dragon voice recognition software and may include unintentional dictation errors.  MRN: [id number] DOB: 05-04-1980  Subjective:   Dana Hughes is a 43 y.o. female presenting for evaluation of right lower back pain x 1 to 2 days.  Patient denies any known injury or trauma.  Patient has taken over-the-counter Aleve  and ibuprofen  with no improvement.  Patient also reports that she recently had cervical vaginal swab completed at her primary care provider's office which she has not received a call back with results.  Patient had vaginal irritation and discharge consistent with vaginal Candida and has been using over-the-counter Monistat  vaginal cream.  Patient would like to discuss the results of her testing because she has not been able to visualize them in MyChart.  Patient also starts mild dry cough off and on for 4 to 5 weeks has been taking NyQuil and DayQuil with no significant improvement.  Patient reports that cough is usually much worse at night.  No shortness of breath, chest pain, weakness, dizziness.  No current facility-administered medications for this encounter.  Current Outpatient Medications:    baclofen (LIORESAL) 20 MG tablet, Take 1 tablet (20 mg total) by mouth 3 (three) times daily., Disp: 30 each, Rfl: 0   fluconazole  (DIFLUCAN ) 150 MG tablet, Take 1 tablet (150 mg total) by mouth daily. If symptoms persist take second dose of Diflucan  150 mg on day 3 to treat vaginal Candida., Disp: 2 tablet, Rfl: 0   metroNIDAZOLE  (FLAGYL ) 500 MG tablet, Take 1 tablet (500 mg total) by mouth 2 (two) times daily., Disp: 14 tablet, Rfl: 0   acetaminophen  (TYLENOL ) 500 MG tablet, Take 2 tablets (1,000 mg total) by mouth every 6 (six) hours as needed for mild pain (or Fever >/= 101)., Disp: 30 tablet, Rfl: 0   albuterol  (PROVENTIL ) (2.5 MG/3ML) 0.083% nebulizer solution, Take 3 mLs (2.5 mg total) by nebulization every 6 (six)  hours as needed for wheezing or shortness of breath., Disp: 75 mL, Rfl: 0   albuterol  (VENTOLIN  HFA) 108 (90 Base) MCG/ACT inhaler, Inhale 2 puffs into the lungs every 6 (six) hours as needed for wheezing or shortness of breath. Reported on 09/29/2015, Disp: , Rfl:    amLODipine  (NORVASC ) 5 MG tablet, Take 1 tablet (5 mg total) by mouth daily., Disp: 90 tablet, Rfl: 3   apixaban  (ELIQUIS ) 5 MG TABS tablet, Take 1 tablet (5 mg total) by mouth 2 (two) times daily., Disp: 60 tablet, Rfl: 2   ARIPiprazole  (ABILIFY ) 20 MG tablet, Take 1 tablet (20 mg total) by mouth daily. (Patient taking differently: Take 20 mg by mouth at bedtime.), Disp: 30 tablet, Rfl: 3   Cholecalciferol  125 MCG (5000 UT) capsule, Take 1 capsule (5,000 Units total) by mouth daily., Disp: 30 capsule, Rfl: 2   dicyclomine  (BENTYL ) 20 MG tablet, Take 1 tablet (20 mg total) by mouth 3 (three) times daily as needed for spasms., Disp: 90 tablet, Rfl: 3   furosemide  (LASIX ) 20 MG tablet, Take 2 tablets (40 mg total) by mouth daily., Disp: 60 tablet, Rfl: 0   gabapentin  (NEURONTIN ) 300 MG capsule, Take 1 capsule (300 mg total) by mouth 3 (three) times daily. (Patient taking differently: Take 300 mg by mouth 2 (two) times daily.), Disp: 90 capsule, Rfl: 3   insulin  aspart (NOVOLOG ) 100 UNIT/ML FlexPen, Inject 10 Units into the skin 2 (two) times daily with a meal., Disp: 6 mL, Rfl: 0   insulin  glargine (  LANTUS  SOLOSTAR) 100 UNIT/ML Solostar Pen, Inject 26 Units into the skin daily., Disp: 15 mL, Rfl: 3   lamoTRIgine  (LAMICTAL ) 150 MG tablet, Take 1 tablet (150 mg total) by mouth daily., Disp: 30 tablet, Rfl: 3   lidocaine  (HM LIDOCAINE  PATCH) 4 %, Place 1 patch onto the skin daily as needed., Disp: 6 patch, Rfl: 6   lidocaine  (XYLOCAINE ) 5 % ointment, Apply 1 Application topically as needed., Disp: 35.44 g, Rfl: 0   metFORMIN  (GLUCOPHAGE ) 1000 MG tablet, Take 1 tablet (1,000 mg total) by mouth 2 (two) times daily with a meal., Disp: 90 tablet,  Rfl: 3   mirtazapine  (REMERON ) 45 MG tablet, Take 1 tablet (45 mg total) by mouth at bedtime., Disp: 30 tablet, Rfl: 3   mometasone -formoterol  (DULERA ) 200-5 MCG/ACT AERO, Inhale 2 puffs into the lungs 2 (two) times daily., Disp: 13 g, Rfl: 2   omeprazole  (PRILOSEC) 40 MG capsule, Take 1 capsule (40 mg total) by mouth daily., Disp: 30 capsule, Rfl: 1   SUMAtriptan  (IMITREX ) 50 MG tablet, Take 1 tablet (50 mg total) by mouth as needed for migraine. May repeat in 2 hours if headache persists or recurs. Do not take more than 2 tablets per day., Disp: 60 tablet, Rfl: 3   Tasimelteon  (HETLIOZ ) 20 MG CAPS, Take 20 mg by mouth at bedtime., Disp: 30 capsule, Rfl: 3   Allergies  Allergen Reactions   Mushroom Extract Complex (Obsolete) Anaphylaxis   Latex Itching and Swelling   Penicillins Itching    Has patient had a PCN reaction causing immediate rash, facial/tongue/throat swelling, SOB or lightheadedness with hypotension: No Has patient had a PCN reaction causing severe rash involving mucus membranes or skin necrosis: No Has patient had a PCN reaction that required hospitalization No Has patient had a PCN reaction occurring within the last 10 years: No If all of the above answers are "NO", then may proceed with Cephalosporin use.    Wound Dressing Adhesive Itching    Past Medical History:  Diagnosis Date   Anxiety    Arthritis    Asthma    BPPV (benign paroxysmal positional vertigo) 05/18/2022   Bronchitis    BV (bacterial vaginosis)    Candidiasis    Carpal tunnel syndrome, bilateral 01/16/2019   Chest pain of uncertain etiology 07/18/2021   Constipation 12/10/2020   Cystocele without uterine prolapse    Depression    Diabetes mellitus without complication (HCC)    Endometriosis    Exertional dyspnea 06/24/2021   Headache(784.0)    migraines   Hypertension    no longer takes meds   Migraines    Mild concentric left ventricular hypertrophy (LVH) 08/12/2020   Echo (07/2020):  1.  Left ventricular ejection fraction, by estimation, is 45 to 50%. The  left ventricle has mildly decreased function. The left ventricle  demonstrates global hypokinesis. There is mild asymmetric left ventricular  hypertrophy of the basal-septal segment.  The interventricular septum is flattened in systole, consistent with right  ventricular pressure overload.   2. Right ve   Muscle spasm    Ovarian cyst    Pelvic pain in female 09/29/2015   Rectal bleeding 12/09/2020   FHx:   Father: colon cancer (61 yo)  Paternal aunt: colon cancer (~62 yo), breast cancer (unknown age).  Paternal gma: Breast cancer   Paternal 2nd cousin: Ovarian cancer     Colonoscopy (07/2020):  Perianal and digital rectal examinations: normal.  Findings:  - A patchy area of moderately nodular mucosa  was found at the hepatic flexure and in the ascending colon.  - A patchy area of moderately no   Scoliosis    Thrombocytosis 08/12/2020   Uterine fibroid    Vulvovaginal candidiasis 05/15/2018     Past Surgical History:  Procedure Laterality Date   BIOPSY  08/12/2020   Procedure: BIOPSY;  Surgeon: Sergio Dandy, MD;  Location: [facility];  Service: Endoscopy;;   CHOLECYSTECTOMY     COLONOSCOPY WITH PROPOFOL  N/A 08/12/2020   Procedure: COLONOSCOPY WITH PROPOFOL ;  Surgeon: Sergio Dandy, MD;  Location: [facility];  Service: Endoscopy;  Laterality: N/A;   ESOPHAGOGASTRODUODENOSCOPY (EGD) WITH PROPOFOL  N/A 08/12/2020   Procedure: ESOPHAGOGASTRODUODENOSCOPY (EGD) WITH PROPOFOL ;  Surgeon: Sergio Dandy, MD;  Location: [facility];  Service: Endoscopy;  Laterality: N/A;   IR ANGIOGRAM PULMONARY BILATERAL SELECTIVE  08/22/2022   IR ANGIOGRAM SELECTIVE EACH ADDITIONAL VESSEL  08/22/2022   IR ANGIOGRAM SELECTIVE EACH ADDITIONAL VESSEL  08/22/2022   IR INFUSION THROMBOL ARTERIAL INITIAL (MS)  08/13/2022   IR THROMB F/U EVAL ART/VEN FINAL DAY (MS)  08/14/2022   IR US  GUIDE VASC ACCESS RIGHT  08/13/2022   POLYPECTOMY   08/12/2020   Procedure: POLYPECTOMY;  Surgeon: Sergio Dandy, MD;  Location: [facility];  Service: Endoscopy;;   TUBAL LIGATION      Family History  Problem Relation Age of Onset   Migraines Mother    Cancer Father    Colon cancer Father    Lupus Paternal Grandmother    Cancer Paternal Grandmother    Breast cancer Paternal Grandmother    Migraines Maternal Aunt    Hypertension Maternal Aunt    Breast cancer Maternal Aunt    Colon cancer Paternal Aunt    Breast cancer Paternal Aunt    Ovarian cancer Cousin     Social History   Tobacco Use   Smoking status: Former    Current packs/day: 0.00    Types: Cigarettes    Quit date: 07/2023    Years since quitting: 0.1   Smokeless tobacco: Never   Tobacco comments:    1-2 cigars/week  Vaping Use   Vaping status: Never Used  Substance Use Topics   Alcohol use: Not Currently    Comment: occasional   Drug use: Yes    Frequency: 7.0 times per week    Types: Marijuana    ROS Refer to HPI for ROS details.  Objective:   Vitals: BP 104/68 (BP Location: Right Arm)   Pulse 98   Temp 98.2 F (36.8 C) (Oral)   Resp 18   LMP 08/28/2023 (Exact Date)   SpO2 99%   Breastfeeding No   Physical Exam Vitals and nursing note reviewed.  Constitutional:      General: She is not in acute distress.    Appearance: She is well-developed. She is not ill-appearing or toxic-appearing.  HENT:     Head: Normocephalic and atraumatic.  Cardiovascular:     Rate and Rhythm: Normal rate.  Pulmonary:     Effort: Pulmonary effort is normal. No respiratory distress.  Abdominal:     General: There is no distension.     Palpations: Abdomen is soft.     Tenderness: There is no abdominal tenderness. There is right CVA tenderness. There is no left CVA tenderness, guarding or rebound.  Musculoskeletal:     Lumbar back: Tenderness present. No swelling, signs of trauma, spasms or bony tenderness. Normal range of motion.  Skin:    General:  Skin  is warm and dry.  Neurological:     General: No focal deficit present.     Mental Status: She is alert and oriented to person, place, and time.  Psychiatric:        Mood and Affect: Mood normal.        Behavior: Behavior normal.     Procedures  Results for orders placed or performed during the hospital encounter of 09/14/23 (from the past 24 hours)  POCT urinalysis dipstick     Status: Abnormal   Collection Time: 09/14/23 12:12 PM  Result Value Ref Range   Color, UA yellow    Clarity, UA cloudy (A)    Glucose, UA >=1,000 (A) mg/dL   Bilirubin, UA negative    Ketones, POC UA trace (5) (A) mg/dL   Spec Grav, UA 1.610    Blood, UA trace-lysed (A)    pH, UA 5.5    Protein Ur, POC negative mg/dL   Urobilinogen, UA 0.2 E.U./dL   Nitrite, UA Negative    Leukocytes, UA Negative   POCT urine pregnancy     Status: None   Collection Time: 09/14/23 12:19 PM  Result Value Ref Range   Preg Test, Ur Negative Negative    No results found.   Assessment and Plan :     Discharge Instructions       1. Acute right-sided low back pain without sciatica (Primary) - POCT urinalysis dipstick performed in UC is positive for trace blood, negative for nitrite and leukocytes, no significant sign of urinary tract infection.  Patient does have severely elevated glucose with known history of diabetes. - baclofen (LIORESAL) 20 MG tablet; Take 1 tablet (20 mg total) by mouth 3 (three) times daily.  Dispense: 30 each; Refill: 0  2. BV (bacterial vaginosis) - POCT urine pregnancy performed in UC is negative - metroNIDAZOLE  (FLAGYL ) 500 MG tablet; Take 1 tablet (500 mg total) by mouth 2 (two) times daily.  Dispense: 14 tablet; Refill: 0  3. Vaginal candida - fluconazole  (DIFLUCAN ) 150 MG tablet; Take 1 tablet (150 mg total) by mouth daily. If symptoms persist take second dose of Diflucan  150 mg on day 3 to treat vaginal Candida.  Dispense: 2 tablet; Refill: 0    Margette Sheldon, [place] B, NP 09/14/23 1301    Emmanuel Hark B, NP 09/14/23 1301

## 2023-09-15 ENCOUNTER — Other Ambulatory Visit: Payer: Self-pay

## 2023-09-17 NOTE — Progress Notes (Signed)
 Internal Medicine Clinic Attending  Case discussed with the resident at the time of the visit.  We reviewed the resident's history and exam and pertinent patient test results.  I agree with the assessment, diagnosis, and plan of care documented in the resident's note.

## 2023-09-18 ENCOUNTER — Encounter: Payer: Self-pay | Admitting: Internal Medicine

## 2023-09-18 ENCOUNTER — Other Ambulatory Visit: Payer: Self-pay

## 2023-09-19 ENCOUNTER — Encounter: Payer: MEDICAID | Admitting: Student

## 2023-09-23 DIAGNOSIS — G4730 Sleep apnea, unspecified: Secondary | ICD-10-CM | POA: Diagnosis not present

## 2023-09-25 ENCOUNTER — Other Ambulatory Visit: Payer: Self-pay

## 2023-09-25 MED ORDER — CHLORHEXIDINE GLUCONATE 0.12 % MT SOLN
15.0000 mL | Freq: Two times a day (BID) | OROMUCOSAL | 0 refills | Status: DC
  Filled 2023-09-25: qty 473, 16d supply, fill #0

## 2023-09-25 MED ORDER — IBUPROFEN 800 MG PO TABS
800.0000 mg | ORAL_TABLET | Freq: Three times a day (TID) | ORAL | 0 refills | Status: AC | PRN
  Filled 2023-09-25: qty 20, 7d supply, fill #0

## 2023-09-25 MED ORDER — AMOXICILLIN 500 MG PO CAPS
500.0000 mg | ORAL_CAPSULE | Freq: Three times a day (TID) | ORAL | 0 refills | Status: AC
  Filled 2023-09-25: qty 21, 7d supply, fill #0

## 2023-09-26 ENCOUNTER — Ambulatory Visit: Payer: Self-pay | Admitting: Student

## 2023-09-26 NOTE — Telephone Encounter (Signed)
 FYI Only or Action Required?: Action required by provider  Patient was last seen in primary care on 09/11/2023 by Marni Sins, MD. Called Nurse Triage reporting Blurred Vision. Symptoms began several weeks ago. Interventions attempted: Nothing. Symptoms are: gradually worsening.  Triage Disposition: See PCP Within 2 Weeks  Patient/caregiver understands and will follow disposition?: Patient refuses scheduling an appointment at this time unless her PCP requires she be seen before sending a referral for an eye doctor. Patient call back number is [phone].               Copied from CRM [id number]. Topic: Clinical - Red Word Triage >> Sep 26, 2023  9:28 AM [name] wrote: Red Word that prompted transfer to Nurse Triage: patient have blurred vision for 2 weeks and is a diabetic  Patient call back number is [phone] Reason for Disposition  [1] Blurred vision or visual changes AND [2] gradual onset (e.g., weeks, months)  Answer Assessment - Initial Assessment Questions Patient was told she needs a referral to schedule with an eye doctor     DESCRIPTION: "How has your vision changed?" (e.g., complete vision loss, blurred vision, double vision, floaters, etc.)     "Blurred vision to the point I can't see what I am writing or the tv or reading" LOCATION: "One or both eyes?" If one, ask: "Which eye?"     Both eyes SEVERITY: "Can you see anything?" If Yes, ask: "What can you see?" (e.g., fine print)     "To the point where I can't see anything or read, its getting worse" ONSET: "When did this begin?" "Did it start suddenly or has this been gradual?"     2.5 weeks ago PATTERN: "Does this come and go, or has it been constant since it started?"     Comes and goes, getting worse PAIN: "Is there any pain in your eye(s)?"  (Scale 1-10; or mild, moderate, severe)   - NONE (0): No pain.   - MILD (1-3): Doesn't interfere with normal activities.   - MODERATE (4-7): Interferes with  normal activities or awakens from sleep.    - SEVERE (8-10): Excruciating pain, unable to do any normal activities.     Strain trying to read CONTACTS-GLASSES: "Do you wear contacts or glasses?"     Reading glasses but not helping CAUSE: "What do you think is causing this visual problem?"     "Could be diabetes" OTHER SYMPTOMS: "Do you have any other symptoms?" (e.g., confusion, headache, arm or leg weakness, speech problems)     Headache in back of head and top and side of temples  Protocols used: Vision Loss or Change-A-AH

## 2023-09-26 NOTE — Telephone Encounter (Signed)
 Informed pt a referral is not needed per Dr Jari Merles. Pt stated the office on [id number] (she does not know the office name) told her she will need a referral. Pt stated she will send the office's  name via My Chart.

## 2023-09-26 NOTE — Telephone Encounter (Signed)
 Return pt's call - stated her vision is very blurry; it has became worse after her last appt with us . Stated she prefers not to come in; wants to know if the doctor can order a referral.

## 2023-09-30 NOTE — Telephone Encounter (Signed)
  Name: Dana Hughes, Dana Hughes MRN: [id number]  Date: 10/03/2023 Status: Sch  Time: 10:45 AM Length: 30  Visit Type: OPEN ESTABLISHED [726] Copay: $0.00  Provider: Arellano Zameza, Priscila, MD      Copied from CRM [id number]. Topic: Referral - Status >> Sep 26, 2023  9:24 AM [name] wrote: Reason for CRM: patient calling on status referral for getting an mamogram reason have a lump in her right breast   need a referral for eye specialist reason having a lot of eye blurring vison and is a diabetic  Patient  [phone]

## 2023-10-03 ENCOUNTER — Encounter: Payer: MEDICAID | Admitting: Student

## 2023-10-09 ENCOUNTER — Encounter: Payer: MEDICAID | Admitting: Student

## 2023-10-21 ENCOUNTER — Encounter: Payer: MEDICAID | Admitting: Student

## 2023-10-21 NOTE — Progress Notes (Deleted)
   Established Patient Office Visit  Subjective   Patient ID: Dana Hughes, female    DOB: Sep 13, 1980  Age: 43 y.o. MRN: [id number]  No chief complaint on file.   HPI This is a 43 year old female with past medical history of migraines, type 2 diabetes who presents today for an acute visit for breast lump and blurry vision.  Past Medical History:  Diagnosis Date   Anxiety    Arthritis    Asthma    BPPV (benign paroxysmal positional vertigo) 05/18/2022   Bronchitis    BV (bacterial vaginosis)    Candidiasis    Carpal tunnel syndrome, bilateral 01/16/2019   Chest pain of uncertain etiology 07/18/2021   Constipation 12/10/2020   Cystocele without uterine prolapse    Depression    Diabetes mellitus without complication (HCC)    Endometriosis    Exertional dyspnea 06/24/2021   Headache(784.0)    migraines   Hypertension    no longer takes meds   Migraines    Mild concentric left ventricular hypertrophy (LVH) 08/12/2020   Echo (07/2020):  1. Left ventricular ejection fraction, by estimation, is 45 to 50%. The  left ventricle has mildly decreased function. The left ventricle  demonstrates global hypokinesis. There is mild asymmetric left ventricular  hypertrophy of the basal-septal segment.  The interventricular septum is flattened in systole, consistent with right  ventricular pressure overload.   2. Right ve   Muscle spasm    Ovarian cyst    Pelvic pain in female 09/29/2015   Rectal bleeding 12/09/2020   FHx:   Father: colon cancer (32 yo)  Paternal aunt: colon cancer (~26 yo), breast cancer (unknown age).  Paternal gma: Breast cancer   Paternal 2nd cousin: Ovarian cancer     Colonoscopy (07/2020):  Perianal and digital rectal examinations: normal.  Findings:  - A patchy area of moderately nodular mucosa was found at the hepatic flexure and in the ascending colon.  - A patchy area of moderately no   Scoliosis    Thrombocytosis 08/12/2020   Uterine fibroid    Vulvovaginal  candidiasis 05/15/2018    ROS    Objective:     There were no vitals taken for this visit. BP Readings from Last 3 Encounters:  09/14/23 104/68  09/11/23 136/89  08/29/23 (!) 138/90   Wt Readings from Last 3 Encounters:  09/11/23 198 lb (89.8 kg)  08/29/23 201 lb 9.6 oz (91.4 kg)  07/18/23 205 lb 4.8 oz (93.1 kg)   SpO2 Readings from Last 3 Encounters:  09/14/23 99%  09/11/23 100%  08/29/23 96%      Physical Exam   No results found for any visits on 10/21/23.     Last lipids Lab Results  Component Value Date   CHOL 152 06/12/2022   HDL 40 06/12/2022   LDLCALC 76 06/12/2022   TRIG 216 (H) 06/12/2022   CHOLHDL 3.8 06/12/2022   Last hemoglobin A1c Lab Results  Component Value Date   HGBA1C 11.2 (A) 07/18/2023      The 10-year ASCVD risk score (Arnett DK, et al., 2019) is: 2.2%    Assessment & Plan:  Breast lump Last mammo 08/23/2022 that was normal. Mammogram referral in place  Blurry vision History of migraine headaches Hx of diabetes  -Currently taking sumatriptan  50 mg daily - Needs eye specialist    Problem List Items Addressed This Visit   None   No follow-ups on file.    Toma Edwards, DO

## 2023-10-22 ENCOUNTER — Other Ambulatory Visit: Payer: Self-pay

## 2023-10-30 ENCOUNTER — Telehealth: Payer: Self-pay | Admitting: Student

## 2023-10-30 NOTE — Telephone Encounter (Signed)
 Rec'd a message from [facility]. Please see message below as a new order will need to be placed before she can be sch.   (Pt states that she is having pain in her right breast and may have found a lump. Spoke with patient to have her get with her Dr. for diag and us  orders.//KP)

## 2023-10-30 NOTE — Progress Notes (Unsigned)
 Patient name: Dana Hughes Date of birth: [birth year] Date of visit: 10/30/23  Type of visit: Established Patient Office Visit   Subjective   Chief concern: No chief complaint on file.   Dana Hughes is a 43 y.o. female with a PMHx of T2DM, HFrEF, HTN, who presents to [facility] clinic for evaluation of a breast lump she felt recently.  Also having blurry vision and is requesting a referral.   Patient Active Problem List   Diagnosis Date Noted   Influenza A 07/18/2023   Irritable bowel 07/10/2023   Syncope and collapse 07/10/2023   Chronic pulmonary embolism (HCC) 05/01/2023   Headache 03/14/2023   Snoring 11/22/2022   Pregnancy 11/22/2022   Lower extremity edema 11/06/2022   Asthma 09/10/2022   Chest pain 09/06/2022   Uterine fibroid    Homicidal ideation 06/24/2021   Marijuana dependence (HCC) 04/13/2021   PTSD (post-traumatic stress disorder) 01/19/2021   Bipolar 2 disorder, major depressive episode (HCC) 01/11/2021   Complex ovarian cyst 12/10/2020   Migraine without status migrainosus, not intractable 12/09/2020   HFrEF (heart failure with reduced ejection fraction) (HCC) 08/12/2020   History of colonic polyps 08/12/2020   Vaginal candidiasis 08/12/2020   History of pulmonary embolism 08/08/2020   Fibroids 05/05/2020   Hematochezia 04/08/2020   Iron  deficiency anemia 04/08/2020   Severe episode of recurrent major depressive disorder, 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/11/2020   Generalized anxiety disorder 03/11/2020   Diabetes mellitus, type 2 (HCC) 12/09/2019   Hypertension 07/21/2018   Endometriosis 08/30/2017     Past Surgical History:  Procedure Laterality Date   BIOPSY  08/12/2020   Procedure: BIOPSY;  Surgeon: Shila Gustav GAILS, MD;  Location: [facility];  Service: Endoscopy;;   CHOLECYSTECTOMY     COLONOSCOPY WITH PROPOFOL  N/A 08/12/2020   Procedure: COLONOSCOPY WITH PROPOFOL ;  Surgeon: Shila Gustav GAILS, MD;  Location: [facility];  Service:  Endoscopy;  Laterality: N/A;   ESOPHAGOGASTRODUODENOSCOPY (EGD) WITH PROPOFOL  N/A 08/12/2020   Procedure: ESOPHAGOGASTRODUODENOSCOPY (EGD) WITH PROPOFOL ;  Surgeon: Shila Gustav GAILS, MD;  Location: [facility] ENDOSCOPY;  Service: Endoscopy;  Laterality: N/A;   IR ANGIOGRAM PULMONARY BILATERAL SELECTIVE  08/22/2022   IR ANGIOGRAM SELECTIVE EACH ADDITIONAL VESSEL  08/22/2022   IR ANGIOGRAM SELECTIVE EACH ADDITIONAL VESSEL  08/22/2022   IR INFUSION THROMBOL ARTERIAL INITIAL (MS)  08/13/2022   IR THROMB F/U EVAL ART/VEN FINAL DAY (MS)  08/14/2022   IR US  GUIDE VASC ACCESS RIGHT  08/13/2022   POLYPECTOMY  08/12/2020   Procedure: POLYPECTOMY;  Surgeon: Shila Gustav GAILS, MD;  Location: [facility] ENDOSCOPY;  Service: Endoscopy;;   TUBAL LIGATION      ROS  Current Outpatient Medications  Medication Instructions   acetaminophen  (TYLENOL ) 1,000 mg, Oral, Every 6 hours PRN   albuterol  (PROVENTIL ) 2.5 mg, Nebulization, Every 6 hours PRN   albuterol  (VENTOLIN  HFA) 108 (90 Base) MCG/ACT inhaler 2 puffs, Inhalation, Every 6 hours PRN, Reported on 09/29/2015   amLODipine  (NORVASC ) 5 mg, Oral, Daily   ARIPiprazole  (ABILIFY ) 20 mg, Oral, Daily   baclofen  (LIORESAL ) 20 mg, Oral, 3 times daily   chlorhexidine  (PERIDEX ) 0.12 % solution Rinse with 15ml by mouth for 1 minute then spit, 2 (two) times daily for 10 days.   dicyclomine  (BENTYL ) 20 mg, Oral, 3 times daily PRN   Eliquis  5 mg, Oral, 2 times daily   fluconazole  (DIFLUCAN ) 150 mg, Oral, Daily, If symptoms persist take second dose of Diflucan  150 mg on day 3 to treat vaginal Candida.  furosemide  (LASIX ) 40 mg, Oral, Daily   gabapentin  (NEURONTIN ) 300 mg, Oral, 3 times daily   ibuprofen  (ADVIL ) 800 MG tablet Take 1 tablet every 8 hours as needed for pain   insulin  aspart (NOVOLOG ) 10 Units, Subcutaneous, 2 times daily with meals   lamoTRIgine  (LAMICTAL ) 150 mg, Oral, Daily   Lantus  SoloStar 26 Units, Subcutaneous, Daily   lidocaine  (HM LIDOCAINE  PATCH) 4 % 1 patch,  Transdermal, Daily PRN   lidocaine  (XYLOCAINE ) 5 % ointment 1 Application, Topical, As needed   metFORMIN  (GLUCOPHAGE ) 1,000 mg, Oral, 2 times daily with meals   metroNIDAZOLE  (FLAGYL ) 500 mg, Oral, 2 times daily   mirtazapine  (REMERON ) 45 mg, Oral, Daily at bedtime   mometasone -formoterol  (DULERA ) 200-5 MCG/ACT AERO 2 puffs, Inhalation, 2 times daily   omeprazole  (PRILOSEC) 40 mg, Oral, Daily   SUMAtriptan  (IMITREX ) 50 MG tablet Take 1 tablet (50 mg total) by mouth as needed for migraine. May repeat in 2 hours if headache persists or recurs. Do not take more than 2 tablets per day.   Tasimelteon  (HETLIOZ ) 20 mg, Oral, Nightly    Social History   Tobacco Use   Smoking status: Former    Current packs/day: 0.00    Types: Cigarettes    Quit date: 07/2023    Years since quitting: 0.2   Smokeless tobacco: Never   Tobacco comments:    1-2 cigars/week  Vaping Use   Vaping status: Never Used  Substance Use Topics   Alcohol use: Not Currently    Comment: occasional   Drug use: Yes    Frequency: 7.0 times per week    Types: Marijuana      Objective  There were no vitals filed for this visit.There is no height or weight on file to calculate BMI.   Physical Exam  {Labs (Optional):23779}    Assessment & Plan  Problem List Items Addressed This Visit   None  T2DM -Last A1c 11.2, today is  -ophthalmology referral for eye exam -Urine ACR ordered  Breast lump -screening mammogram  Blurry vision -ophthalmology referral   No follow-ups on file.  Patient discussed with Dr. {imcattendings:[id number]}, who also saw and evaluated the patient.  Soraiya Ahner, MD [facility]  PGY-1 10/30/2023, 8:11 PM

## 2023-10-31 DIAGNOSIS — Z1231 Encounter for screening mammogram for malignant neoplasm of breast: Secondary | ICD-10-CM

## 2023-10-31 DIAGNOSIS — E119 Type 2 diabetes mellitus without complications: Secondary | ICD-10-CM

## 2023-11-02 ENCOUNTER — Encounter (HOSPITAL_COMMUNITY): Payer: Self-pay

## 2023-11-02 ENCOUNTER — Ambulatory Visit (HOSPITAL_COMMUNITY)
Admission: EM | Admit: 2023-11-02 | Discharge: 2023-11-02 | Disposition: A | Discharge status: Home / Self Care | Payer: MEDICAID | Attending: Physician Assistant | Admitting: Physician Assistant

## 2023-11-02 DIAGNOSIS — K2900 Acute gastritis without bleeding: Secondary | ICD-10-CM | POA: Diagnosis present

## 2023-11-02 DIAGNOSIS — R002 Palpitations: Secondary | ICD-10-CM | POA: Insufficient documentation

## 2023-11-02 DIAGNOSIS — R1010 Upper abdominal pain, unspecified: Secondary | ICD-10-CM | POA: Diagnosis present

## 2023-11-02 LAB — CBC WITH DIFFERENTIAL/PLATELET
Abs Immature Granulocytes: 0 K/uL (ref 0.00–0.07)
Basophils Absolute: 0 K/uL (ref 0.0–0.1)
Basophils Relative: 0 %
Eosinophils Absolute: 0.4 K/uL (ref 0.0–0.5)
Eosinophils Relative: 3 %
HCT: 32.7 % — ABNORMAL LOW (ref 36.0–46.0)
Hemoglobin: 10 g/dL — ABNORMAL LOW (ref 12.0–15.0)
Lymphocytes Relative: 38 %
Lymphs Abs: 4.4 K/uL — ABNORMAL HIGH (ref 0.7–4.0)
MCH: 23 pg — ABNORMAL LOW (ref 26.0–34.0)
MCHC: 30.6 g/dL (ref 30.0–36.0)
MCV: 75.2 fL — ABNORMAL LOW (ref 80.0–100.0)
Monocytes Absolute: 0.5 K/uL (ref 0.1–1.0)
Monocytes Relative: 4 %
Neutro Abs: 6.4 K/uL (ref 1.7–7.7)
Neutrophils Relative %: 55 %
Platelets: 544 K/uL — ABNORMAL HIGH (ref 150–400)
RBC: 4.35 MIL/uL (ref 3.87–5.11)
RDW: 15.7 % — ABNORMAL HIGH (ref 11.5–15.5)
WBC: 11.7 K/uL — ABNORMAL HIGH (ref 4.0–10.5)
nRBC: 0 % (ref 0.0–0.2)
nRBC: 0 /100{WBCs}

## 2023-11-02 LAB — POCT URINALYSIS DIP (MANUAL ENTRY)
Bilirubin, UA: NEGATIVE
Blood, UA: NEGATIVE
Glucose, UA: 500 mg/dL — AB
Ketones, POC UA: NEGATIVE mg/dL
Leukocytes, UA: NEGATIVE
Nitrite, UA: NEGATIVE
Protein Ur, POC: NEGATIVE mg/dL
Spec Grav, UA: 1.015 (ref 1.010–1.025)
Urobilinogen, UA: 0.2 U/dL
pH, UA: 5.5 (ref 5.0–8.0)

## 2023-11-02 LAB — TSH: TSH: 1.163 u[IU]/mL (ref 0.350–4.500)

## 2023-11-02 LAB — COMPREHENSIVE METABOLIC PANEL WITH GFR
ALT: 30 U/L (ref 0–44)
AST: 24 U/L (ref 15–41)
Albumin: 3.2 g/dL — ABNORMAL LOW (ref 3.5–5.0)
Alkaline Phosphatase: 53 U/L (ref 38–126)
Anion gap: 10 (ref 5–15)
BUN: <5 mg/dL — ABNORMAL LOW (ref 6–20)
CO2: 21 mmol/L — ABNORMAL LOW (ref 22–32)
Calcium: 9 mg/dL (ref 8.9–10.3)
Chloride: 103 mmol/L (ref 98–111)
Creatinine, Ser: 0.62 mg/dL (ref 0.44–1.00)
GFR, Estimated: >60 mL/min (ref >60)
Glucose, Bld: 305 mg/dL — ABNORMAL HIGH (ref 70–99)
Potassium: 3.1 mmol/L — ABNORMAL LOW (ref 3.5–5.1)
Sodium: 134 mmol/L — ABNORMAL LOW (ref 135–145)
Total Bilirubin: 0.6 mg/dL (ref 0.0–1.2)
Total Protein: 7.1 g/dL (ref 6.5–8.1)

## 2023-11-02 LAB — POCT URINE PREGNANCY: Preg Test, Ur: NEGATIVE

## 2023-11-02 LAB — LIPASE, BLOOD: Lipase: 41 U/L (ref 11–51)

## 2023-11-02 MED ORDER — ALUM & MAG HYDROXIDE-SIMETH 200-200-20 MG/5ML PO SUSP
30.0000 mL | Freq: Once | ORAL | Status: AC
  Administered 2023-11-02: 30 mL via ORAL

## 2023-11-02 MED ORDER — SUCRALFATE 1 G PO TABS
1.0000 g | ORAL_TABLET | Freq: Three times a day (TID) | ORAL | 0 refills | Status: AC
  Filled 2023-11-02: qty 21, 6d supply, fill #0

## 2023-11-02 MED ORDER — FAMOTIDINE 20 MG PO TABS
20.0000 mg | ORAL_TABLET | Freq: Two times a day (BID) | ORAL | 0 refills | Status: AC
  Filled 2023-11-02: qty 30, 15d supply, fill #0

## 2023-11-02 MED ORDER — ALUM & MAG HYDROXIDE-SIMETH 200-200-20 MG/5ML PO SUSP
ORAL | Status: AC
  Filled 2023-11-02: qty 30

## 2023-11-02 NOTE — ED Triage Notes (Signed)
 Pt presents to UC for c/o sharp pains in umbilicus that radiates to left flank since last night. Took ibuprofen  and it did not help. Having hot sweats. Pt reports feeling of heart fluttering the past few days.

## 2023-11-02 NOTE — Discharge Instructions (Signed)
 I am glad that you are feeling better after the medication.  I believe that you have irritation of your stomach lining.  I will contact you if any of your blood work is abnormal.  Continue omeprazole  as previously prescribed.  Start Carafate  before each meal and before bed.  Take Pepcid  twice daily for the next 2 weeks.  Avoid spicy/acidic/fatty foods.  Avoid NSAIDs including aspirin , ibuprofen /Advil , naproxen /Aleve .  Avoid alcohol.  If you have any severe abdominal pain, nausea/vomiting interfering with oral intake, blood in your stool, dark black stool, fever you should go to the emergency room.  Follow-up with your cardiologist about the palpitations.  I will contact you if any blood work is abnormal.  If you develop any chest pain, shortness of breath, lightheadedness, weakness, feeling like you are going to pass out you need to go to the ER immediately.

## 2023-11-02 NOTE — ED Provider Notes (Signed)
 [facility]    CSN: [id number] Arrival date & time: 11/02/23  1548      History   Chief Complaint No chief complaint on file.   HPI Dana Hughes is a 43 y.o. female.   Patient presents today with a 24-hour history of left upper abdominal pain.  She reports that the pain is described as sharp with radiation throughout her epigastrium and to her left flank and abdomen.  It is rated 9 on a 0-10 pain scale, described as sharp, no alleviating factors identified.  She did try ibuprofen  800 without improvement of symptoms.  She denies any diarrhea or vomiting.  Has had associated nausea.  She does not take NSAIDs regularly.  Denies any increased alcohol consumption.  She does not take a GLP-1 agonist.  Denies any recent medication changes or dietary changes.  She denies history of gastrointestinal disorder including GERD or peptic ulcer disease.  She is status post cholecystectomy but denies additional abdominal surgeries.  She is confident that she is not pregnant.  She does have a history of PE and reports that a few weeks ago she was without her Eliquis  for approximately 1 week due to cost but then has since been compliant with the medication.  She does report some ongoing cough but pain is not similar to previous episodes of PE.  She denies any associated melena or hematochezia.    Past Medical History:  Diagnosis Date   Anxiety    Arthritis    Asthma    BPPV (benign paroxysmal positional vertigo) 05/18/2022   Bronchitis    BV (bacterial vaginosis)    Candidiasis    Carpal tunnel syndrome, bilateral 01/16/2019   Chest pain of uncertain etiology 07/18/2021   Constipation 12/10/2020   Cystocele without uterine prolapse    Depression    Diabetes mellitus without complication (HCC)    Endometriosis    Exertional dyspnea 06/24/2021   Headache(784.0)    migraines   Hypertension    no longer takes meds   Migraines    Mild concentric left ventricular hypertrophy (LVH)  08/12/2020   Echo (07/2020):  1. Left ventricular ejection fraction, by estimation, is 45 to 50%. The  left ventricle has mildly decreased function. The left ventricle  demonstrates global hypokinesis. There is mild asymmetric left ventricular  hypertrophy of the basal-septal segment.  The interventricular septum is flattened in systole, consistent with right  ventricular pressure overload.   2. Right ve   Muscle spasm    Ovarian cyst    Pelvic pain in female 09/29/2015   Rectal bleeding 12/09/2020   FHx:   Father: colon cancer (29 yo)  Paternal aunt: colon cancer (~38 yo), breast cancer (unknown age).  Paternal gma: Breast cancer   Paternal 2nd cousin: Ovarian cancer     Colonoscopy (07/2020):  Perianal and digital rectal examinations: normal.  Findings:  - A patchy area of moderately nodular mucosa was found at the hepatic flexure and in the ascending colon.  - A patchy area of moderately no   Scoliosis    Thrombocytosis 08/12/2020   Uterine fibroid    Vulvovaginal candidiasis 05/15/2018    Patient Active Problem List   Diagnosis Date Noted   Influenza A 07/18/2023   Irritable bowel 07/10/2023   Syncope and collapse 07/10/2023   Chronic pulmonary embolism (HCC) 05/01/2023   Headache 03/14/2023   Snoring 11/22/2022   Pregnancy 11/22/2022   Lower extremity edema 11/06/2022   Asthma 09/10/2022   Chest  pain 09/06/2022   Uterine fibroid    Homicidal ideation 06/24/2021   Marijuana dependence (HCC) 04/13/2021   PTSD (post-traumatic stress disorder) 01/19/2021   Bipolar 2 disorder, major depressive episode (HCC) 01/11/2021   Complex ovarian cyst 12/10/2020   Migraine without status migrainosus, not intractable 12/09/2020   HFrEF (heart failure with reduced ejection fraction) (HCC) 08/12/2020   History of colonic polyps 08/12/2020   Vaginal candidiasis 08/12/2020   History of pulmonary embolism 08/08/2020   Fibroids 05/05/2020   Hematochezia 04/08/2020   Iron  deficiency anemia  04/08/2020   Severe episode of recurrent major depressive disorder, 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/11/2020   Generalized anxiety disorder 03/11/2020   Diabetes mellitus, type 2 (HCC) 12/09/2019   Hypertension 07/21/2018   Endometriosis 08/30/2017    Past Surgical History:  Procedure Laterality Date   BIOPSY  08/12/2020   Procedure: BIOPSY;  Surgeon: Shila Gustav GAILS, MD;  Location: [facility];  Service: Endoscopy;;   CHOLECYSTECTOMY     COLONOSCOPY WITH PROPOFOL  N/A 08/12/2020   Procedure: COLONOSCOPY WITH PROPOFOL ;  Surgeon: Shila Gustav GAILS, MD;  Location: [facility];  Service: Endoscopy;  Laterality: N/A;   ESOPHAGOGASTRODUODENOSCOPY (EGD) WITH PROPOFOL  N/A 08/12/2020   Procedure: ESOPHAGOGASTRODUODENOSCOPY (EGD) WITH PROPOFOL ;  Surgeon: Shila Gustav GAILS, MD;  Location: [facility];  Service: Endoscopy;  Laterality: N/A;   IR ANGIOGRAM PULMONARY BILATERAL SELECTIVE  08/22/2022   IR ANGIOGRAM SELECTIVE EACH ADDITIONAL VESSEL  08/22/2022   IR ANGIOGRAM SELECTIVE EACH ADDITIONAL VESSEL  08/22/2022   IR INFUSION THROMBOL ARTERIAL INITIAL (MS)  08/13/2022   IR THROMB F/U EVAL ART/VEN FINAL DAY (MS)  08/14/2022   IR US  GUIDE VASC ACCESS RIGHT  08/13/2022   POLYPECTOMY  08/12/2020   Procedure: POLYPECTOMY;  Surgeon: Shila Gustav GAILS, MD;  Location: [facility];  Service: Endoscopy;;   TUBAL LIGATION      OB History     Gravida  5   Para  3   Term  3   Preterm  0   AB  1   Living  3      SAB  1   IAB  0   Ectopic  0   Multiple  0   Live Births  3        Obstetric Comments  SVD x 3          Home Medications    Prior to Admission medications   Medication Sig Start Date End Date Taking? Authorizing Provider  famotidine  (PEPCID ) 20 MG tablet Take 1 tablet (20 mg total) by mouth 2 (two) times daily. 11/02/23  Yes Cyncere Sontag K, PA-C  sucralfate  (CARAFATE ) 1 g tablet Take 1 tablet (1 g total) by mouth 4 (four) times daily -  with meals and at bedtime.  11/02/23  Yes Nikkolas Coomes K, PA-C  acetaminophen  (TYLENOL ) 500 MG tablet Take 2 tablets (1,000 mg total) by mouth every 6 (six) hours as needed for mild pain (or Fever >/= 101). 08/12/20   Sherlean, [name], MD  albuterol  (PROVENTIL ) (2.5 MG/3ML) 0.083% nebulizer solution Take 3 mLs (2.5 mg total) by nebulization every 6 (six) hours as needed for wheezing or shortness of breath. 07/17/21   Susen Pastor, MD  albuterol  (VENTOLIN  HFA) 108 (90 Base) MCG/ACT inhaler Inhale 2 puffs into the lungs every 6 (six) hours as needed for wheezing or shortness of breath. Reported on 09/29/2015 07/17/21   Susen Pastor, MD  amLODipine  (NORVASC ) 5 MG tablet Take 1 tablet (5 mg total) by mouth  daily. 11/06/22 11/06/23  Masters, Izetta, DO  apixaban  (ELIQUIS ) 5 MG TABS tablet Take 1 tablet (5 mg total) by mouth 2 (two) times daily. 05/02/23 11/21/23  Gregary Sharper, MD  ARIPiprazole  (ABILIFY ) 20 MG tablet Take 1 tablet (20 mg total) by mouth daily. Patient taking differently: Take 20 mg by mouth at bedtime. 06/27/22   Harl Zane BRAVO, NP  baclofen  (LIORESAL ) 20 MG tablet Take 1 tablet (20 mg total) by mouth 3 (three) times daily. 09/14/23   Reddick, Johnathan B, NP  chlorhexidine  (PERIDEX ) 0.12 % solution Rinse with 15ml by mouth for 1 minute then spit, 2 (two) times daily for 10 days. 09/25/23     dicyclomine  (BENTYL ) 20 MG tablet Take 1 tablet (20 mg total) by mouth 3 (three) times daily as needed for spasms. 07/10/23   Harrie Bruckner, DO  fluconazole  (DIFLUCAN ) 150 MG tablet Take 1 tablet (150 mg total) by mouth daily. If symptoms persist take second dose of Diflucan  150 mg on day 3 to treat vaginal Candida. 09/14/23   Reddick, Johnathan B, NP  furosemide  (LASIX ) 20 MG tablet Take 2 tablets (40 mg total) by mouth daily. 11/06/22   Masters, Katie, DO  gabapentin  (NEURONTIN ) 300 MG capsule Take 1 capsule (300 mg total) by mouth 3 (three) times daily. Patient taking differently: Take 300 mg by mouth 2 (two) times daily.  06/27/22   Harl Zane BRAVO, NP  ibuprofen  (ADVIL ) 800 MG tablet Take 1 tablet every 8 hours as needed for pain 09/25/23     insulin  aspart (NOVOLOG ) 100 UNIT/ML FlexPen Inject 10 Units into the skin 2 (two) times daily with a meal. 06/12/22   Susen Pastor, MD  insulin  glargine (LANTUS  SOLOSTAR) 100 UNIT/ML Solostar Pen Inject 26 Units into the skin daily. 09/11/23   Celestina Czar, MD  lamoTRIgine  (LAMICTAL ) 150 MG tablet Take 1 tablet (150 mg total) by mouth daily. 06/27/22   Harl Zane BRAVO, NP  lidocaine  (HM LIDOCAINE  PATCH) 4 % Place 1 patch onto the skin daily as needed. 07/10/23   Harrie Bruckner, DO  lidocaine  (XYLOCAINE ) 5 % ointment Apply 1 Application topically as needed. 07/10/23   Harrie Bruckner, DO  metFORMIN  (GLUCOPHAGE ) 1000 MG tablet Take 1 tablet (1,000 mg total) by mouth 2 (two) times daily with a meal. 05/31/23   Tobie Gaines, DO  metroNIDAZOLE  (FLAGYL ) 500 MG tablet Take 1 tablet (500 mg total) by mouth 2 (two) times daily. 09/14/23   Reddick, Johnathan B, NP  mirtazapine  (REMERON ) 45 MG tablet Take 1 tablet (45 mg total) by mouth at bedtime. 06/27/22   Harl Zane BRAVO, NP  mometasone -formoterol  (DULERA ) 200-5 MCG/ACT AERO Inhale 2 puffs into the lungs 2 (two) times daily. 08/16/22   Elnora Ip, MD  omeprazole  (PRILOSEC) 40 MG capsule Take 1 capsule (40 mg total) by mouth daily. 02/09/22   Lynwood Lenis, PA-C  SUMAtriptan  (IMITREX ) 50 MG tablet Take 1 tablet (50 mg total) by mouth as needed for migraine. May repeat in 2 hours if headache persists or recurs. Do not take more than 2 tablets per day. 09/11/23   Celestina Czar, MD  Tasimelteon  (HETLIOZ ) 20 MG CAPS Take 20 mg by mouth at bedtime. 06/27/22   Harl Zane BRAVO, NP    Family History Family History  Problem Relation Age of Onset   Migraines Mother    Cancer Father    Colon cancer Father    Lupus Paternal Grandmother    Cancer Paternal Grandmother    Breast cancer Paternal Grandmother  Migraines Maternal Aunt    Hypertension Maternal Aunt    Breast cancer Maternal Aunt    Colon cancer Paternal Aunt    Breast cancer Paternal Aunt    Ovarian cancer Cousin     Social History Social History   Tobacco Use   Smoking status: Former    Current packs/day: 0.00    Types: Cigarettes    Quit date: 07/2023    Years since quitting: 0.2   Smokeless tobacco: Never   Tobacco comments:    1-2 cigars/week  Vaping Use   Vaping status: Never Used  Substance Use Topics   Alcohol use: Not Currently    Comment: occasional   Drug use: Yes    Frequency: 7.0 times per week    Types: Marijuana     Allergies   Mushroom extract complex (obsolete), Latex, Penicillins, and Wound dressing adhesive   Review of Systems Review of Systems  Constitutional:  Positive for activity change. Negative for appetite change, fatigue and fever.  Respiratory:  Positive for cough. Negative for shortness of breath.   Cardiovascular:  Positive for palpitations. Negative for chest pain and leg swelling.  Gastrointestinal:  Positive for abdominal pain and nausea. Negative for constipation, diarrhea and vomiting.  Genitourinary:  Positive for frequency. Negative for dysuria and urgency.  Neurological:  Positive for headaches (chronic). Negative for dizziness and light-headedness.     Physical Exam Triage Vital Signs ED Triage Vitals  Encounter Vitals Group     BP 11/02/23 1626 128/84     Girls Systolic BP Percentile --      Girls Diastolic BP Percentile --      Boys Systolic BP Percentile --      Boys Diastolic BP Percentile --      Pulse Rate 11/02/23 1626 (!) 110     Resp 11/02/23 1626 16     Temp 11/02/23 1626 98.3 F (36.8 C)     Temp Source 11/02/23 1626 Oral     SpO2 11/02/23 1626 96 %     Weight --      Height --      Head Circumference --      Peak Flow --      Pain Score 11/02/23 1622 9     Pain Loc --      Pain Education --      Exclude from Growth Chart --    No data  found.  Updated Vital Signs BP 128/84 (BP Location: Right Arm)   Pulse (!) 103   Temp 98.3 F (36.8 C) (Oral)   Resp 16   LMP 10/15/2023 (Approximate)   SpO2 98%   Visual Acuity Right Eye Distance:   Left Eye Distance:   Bilateral Distance:    Right Eye Near:   Left Eye Near:    Bilateral Near:     Physical Exam Vitals reviewed.  Constitutional:      General: She is awake. She is not in acute distress.    Appearance: Normal appearance. She is well-developed. She is not ill-appearing.     Comments: Very pleasant female appears stated age in no acute distress sitting comfortably in exam room  HENT:     Head: Normocephalic and atraumatic.  Cardiovascular:     Rate and Rhythm: Regular rhythm. Tachycardia present.     Heart sounds: Normal heart sounds, S1 normal and S2 normal. No murmur heard. Pulmonary:     Effort: Pulmonary effort is normal.     Breath sounds: Normal  breath sounds. No wheezing, rhonchi or rales.     Comments: Clear to auscultation bilaterally Abdominal:     General: Bowel sounds are normal.     Palpations: Abdomen is soft.     Tenderness: There is abdominal tenderness in the epigastric area and left upper quadrant. There is left CVA tenderness. There is no right CVA tenderness, guarding or rebound.     Comments: Tenderness palpation epigastrium and left upper quadrant.  Left-sided CVA tenderness.  No evidence of acute abdomen on physical exam.  Psychiatric:        Behavior: Behavior is cooperative.      UC Treatments / Results  Labs (all labs ordered are listed, but only abnormal results are displayed) Labs Reviewed  POCT URINALYSIS DIP (MANUAL ENTRY) - Abnormal; Notable for the following components:      Result Value   Clarity, UA hazy (*)    Glucose, UA =500 (*)    All other components within normal limits  POCT URINE PREGNANCY - Normal  CBC WITH DIFFERENTIAL/PLATELET  COMPREHENSIVE METABOLIC PANEL WITH GFR  LIPASE, BLOOD  TSH     EKG   Radiology No results found.  Procedures Procedures (including critical care time)  Medications Ordered in UC Medications  alum & mag hydroxide-simeth (MAALOX/MYLANTA) 200-200-20 MG/5ML suspension 30 mL (30 mLs Oral Given 11/02/23 1702)    Initial Impression / Assessment and Plan / UC Course  I have reviewed the triage vital signs and the nursing notes.  Pertinent labs & imaging results that were available during my care of the patient were reviewed by me and considered in my medical decision making (see chart for details).     Patient is well-appearing, afebrile, nontoxic.  She is tachycardic.  She did have significant tenderness in her epigastrium and left upper quadrant and we initially discussed potentially going to the emergency room.  She did not want to go to the emergency room if at all avoidable and so we gave her a dose of Maalox and she had significant improvement of symptoms with resolution of tenderness on exam.  Urine pregnancy was negative.  UA showed glucosuria but no evidence of UTI or blood concerning for nephrolithiasis.  I am concerned for gastritis given clinical presentation.  She is already on a PPI it was encouraged to continue this medication but will start Carafate  3 times daily before bed for the next week.  She was also started on Pepcid .  Recommended that she avoid alcohol, spicy/acidic/fatty foods, NSAIDs.  We discussed that if she has any worsening or changing symptoms including severe abdominal pain, nausea, vomiting, diarrhea, melena, hematochezia, fever she needs to be seen emergently.  Basic blood work including CBC, CMP, lipase were obtained and we will contact her if these are abnormal.  Strict return precautions given.  Recommend close follow-up with her primary care.  Patient reported palpitations but denied any chest pain or shortness of breath currently.  EKG was obtained that showed sinus tachycardia with ventricular rate of 108 bpm with PVCs  that has changed compared to her previous tracing on 07/10/2023 as sinus tach replaced normal sinus rhythm and PVCs are new.  We did recommend going to the emergency room as we are unable to rule out cardiac etiology of symptoms but she declined this.  Basic blood work including CBC, CMP, TSH was obtained we will contact her if this is abnormal.  Recommend that she follow-up with her cardiologist on Monday but that she should go to the  emergency room if anything changes and she develops recurrent palpitations, chest pain, shortness of breath, weakness over the weekend to which she expressed understanding and agreement.  Final Clinical Impressions(s) / UC Diagnoses   Final diagnoses:  Acute gastritis, presence of bleeding unspecified, unspecified gastritis type  Upper abdominal pain  Palpitations     Discharge Instructions      I am glad that you are feeling better after the medication.  I believe that you have irritation of your stomach lining.  I will contact you if any of your blood work is abnormal.  Continue omeprazole  as previously prescribed.  Start Carafate  before each meal and before bed.  Take Pepcid  twice daily for the next 2 weeks.  Avoid spicy/acidic/fatty foods.  Avoid NSAIDs including aspirin , ibuprofen /Advil , naproxen /Aleve .  Avoid alcohol.  If you have any severe abdominal pain, nausea/vomiting interfering with oral intake, blood in your stool, dark black stool, fever you should go to the emergency room.  Follow-up with your cardiologist about the palpitations.  I will contact you if any blood work is abnormal.  If you develop any chest pain, shortness of breath, lightheadedness, weakness, feeling like you are going to pass out you need to go to the ER immediately.     ED Prescriptions     Medication Sig Dispense Auth. Provider   sucralfate  (CARAFATE ) 1 g tablet Take 1 tablet (1 g total) by mouth 4 (four) times daily -  with meals and at bedtime. 21 tablet Sheriden Archibeque K, PA-C    famotidine  (PEPCID ) 20 MG tablet Take 1 tablet (20 mg total) by mouth 2 (two) times daily. 30 tablet Khadeem Rockett K, PA-C      PDMP not reviewed this encounter.   Sherrell Rocky POUR, PA-C 11/02/23 1757

## 2023-11-03 ENCOUNTER — Ambulatory Visit (HOSPITAL_COMMUNITY): Payer: Self-pay | Admitting: Physician Assistant

## 2023-11-03 MED ORDER — POTASSIUM CHLORIDE ER 10 MEQ PO TBCR
10.0000 meq | EXTENDED_RELEASE_TABLET | Freq: Every day | ORAL | 0 refills | Status: AC
  Filled 2023-11-03: qty 5, 5d supply, fill #0

## 2023-11-03 NOTE — Progress Notes (Deleted)
 Established Patient Office Visit  Subjective   Patient ID: Dana Hughes, female    DOB: 02-Mar-1981  Age: 43 y.o. MRN: [id number]  No chief complaint on file.  HPI This is a 43 year old female with past medical history of migraines and type 2 diabetes who presents today for an acute visit for breast lump and blurry vision.  Of note, patient was recently seen at Urgent Care on 11/02/23 for abdominal pain. Her symptoms resolved with Maalox but she also had new PVCs and sinus tachycardia on EKG which is new from prior tracings.  Past Medical History:  Diagnosis Date   Anxiety    Arthritis    Asthma    BPPV (benign paroxysmal positional vertigo) 05/18/2022   Bronchitis    BV (bacterial vaginosis)    Candidiasis    Carpal tunnel syndrome, bilateral 01/16/2019   Chest pain of uncertain etiology 07/18/2021   Constipation 12/10/2020   Cystocele without uterine prolapse    Depression    Diabetes mellitus without complication (HCC)    Endometriosis    Exertional dyspnea 06/24/2021   Headache(784.0)    migraines   Hypertension    no longer takes meds   Migraines    Mild concentric left ventricular hypertrophy (LVH) 08/12/2020   Echo (07/2020):  1. Left ventricular ejection fraction, by estimation, is 45 to 50%. The  left ventricle has mildly decreased function. The left ventricle  demonstrates global hypokinesis. There is mild asymmetric left ventricular  hypertrophy of the basal-septal segment.  The interventricular septum is flattened in systole, consistent with right  ventricular pressure overload.   2. Right ve   Muscle spasm    Ovarian cyst    Pelvic pain in female 09/29/2015   Rectal bleeding 12/09/2020   FHx:   Father: colon cancer (51 yo)  Paternal aunt: colon cancer (~60 yo), breast cancer (unknown age).  Paternal gma: Breast cancer   Paternal 2nd cousin: Ovarian cancer     Colonoscopy (07/2020):  Perianal and digital rectal examinations: normal.  Findings:  - A patchy  area of moderately nodular mucosa was found at the hepatic flexure and in the ascending colon.  - A patchy area of moderately no   Scoliosis    Thrombocytosis 08/12/2020   Uterine fibroid    Vulvovaginal candidiasis 05/15/2018    ROS    Objective:     LMP 10/15/2023 (Approximate)  BP Readings from Last 3 Encounters:  11/02/23 128/84  09/14/23 104/68  09/11/23 136/89   Wt Readings from Last 3 Encounters:  09/11/23 198 lb (89.8 kg)  08/29/23 201 lb 9.6 oz (91.4 kg)  07/18/23 205 lb 4.8 oz (93.1 kg)   SpO2 Readings from Last 3 Encounters:  11/02/23 98%  09/14/23 99%  09/11/23 100%      Physical Exam   No results found for any visits on 11/04/23.     Last lipids Lab Results  Component Value Date   CHOL 152 06/12/2022   HDL 40 06/12/2022   LDLCALC 76 06/12/2022   TRIG 216 (H) 06/12/2022   CHOLHDL 3.8 06/12/2022   Last hemoglobin A1c Lab Results  Component Value Date   HGBA1C 11.2 (A) 07/18/2023      The 10-year ASCVD risk score (Arnett DK, et al., 2019) is: 6.5%    Assessment & Plan:  Breast lump Last mammo 08/23/2022 that was normal. Mammogram referral in place  Blurry vision History of migraine headaches Hx of diabetes  -Currently taking sumatriptan  50 mg daily -  Needs eye specialist    Problem List Items Addressed This Visit   None   No follow-ups on file.   Patient discussed with Dr. {[id number]}, who also saw and evaluated the patient.  Tyrea Froberg, MD [facility]  PGY-1  11/03/23, 8:00 PM

## 2023-11-04 ENCOUNTER — Other Ambulatory Visit: Payer: Self-pay

## 2023-11-05 ENCOUNTER — Encounter: Payer: Self-pay | Admitting: Student

## 2023-11-08 ENCOUNTER — Ambulatory Visit: Payer: MEDICAID | Admitting: Obstetrics and Gynecology

## 2023-11-11 ENCOUNTER — Encounter: Payer: MEDICAID | Admitting: Student

## 2023-11-13 ENCOUNTER — Encounter: Payer: Self-pay | Admitting: Internal Medicine

## 2023-11-13 ENCOUNTER — Other Ambulatory Visit: Payer: Self-pay

## 2023-11-13 ENCOUNTER — Telehealth: Payer: Self-pay

## 2023-11-13 ENCOUNTER — Ambulatory Visit: Payer: MEDICAID | Admitting: Student

## 2023-11-13 ENCOUNTER — Encounter: Payer: Self-pay | Admitting: Student

## 2023-11-13 VITALS — BP 137/78 | HR 74 | Temp 98.1°F | Ht 63.5 in | Wt 200.0 lb

## 2023-11-13 DIAGNOSIS — Z7985 Long-term (current) use of injectable non-insulin antidiabetic drugs: Secondary | ICD-10-CM

## 2023-11-13 DIAGNOSIS — Z794 Long term (current) use of insulin: Secondary | ICD-10-CM

## 2023-11-13 DIAGNOSIS — G43009 Migraine without aura, not intractable, without status migrainosus: Secondary | ICD-10-CM | POA: Diagnosis not present

## 2023-11-13 DIAGNOSIS — I10 Essential (primary) hypertension: Secondary | ICD-10-CM

## 2023-11-13 DIAGNOSIS — M419 Scoliosis, unspecified: Secondary | ICD-10-CM | POA: Diagnosis not present

## 2023-11-13 DIAGNOSIS — Z1231 Encounter for screening mammogram for malignant neoplasm of breast: Secondary | ICD-10-CM

## 2023-11-13 DIAGNOSIS — Z7984 Long term (current) use of oral hypoglycemic drugs: Secondary | ICD-10-CM

## 2023-11-13 DIAGNOSIS — Z86711 Personal history of pulmonary embolism: Secondary | ICD-10-CM

## 2023-11-13 DIAGNOSIS — G43E09 Chronic migraine with aura, not intractable, without status migrainosus: Secondary | ICD-10-CM

## 2023-11-13 DIAGNOSIS — E119 Type 2 diabetes mellitus without complications: Secondary | ICD-10-CM | POA: Diagnosis not present

## 2023-11-13 LAB — GLUCOSE, CAPILLARY: Glucose-Capillary: 241 mg/dL — ABNORMAL HIGH (ref 70–99)

## 2023-11-13 LAB — POCT GLYCOSYLATED HEMOGLOBIN (HGB A1C): Hemoglobin A1C: 11.1 % — AB (ref 4.0–5.6)

## 2023-11-13 MED ORDER — NURTEC 75 MG PO TBDP
75.0000 mg | ORAL_TABLET | Freq: Once | ORAL | 3 refills | Status: AC | PRN
  Filled 2023-11-13: qty 8, 30d supply, fill #0

## 2023-11-13 MED ORDER — APIXABAN 5 MG PO TABS
5.0000 mg | ORAL_TABLET | Freq: Two times a day (BID) | ORAL | 2 refills | Status: AC
  Filled 2023-11-13 – 2023-12-31 (×2): qty 60, 30d supply, fill #0

## 2023-11-13 MED ORDER — AMLODIPINE BESYLATE 5 MG PO TABS
5.0000 mg | ORAL_TABLET | Freq: Every day | ORAL | 3 refills | Status: AC
  Filled 2023-11-13: qty 90, 90d supply, fill #0

## 2023-11-13 MED ORDER — DEXCOM G7 SENSOR MISC
3 refills | Status: AC
  Filled 2023-11-13: qty 3, 30d supply, fill #0

## 2023-11-13 MED ORDER — SEMAGLUTIDE(0.25 OR 0.5MG/DOS) 2 MG/3ML ~~LOC~~ SOPN
0.2500 mg | PEN_INJECTOR | SUBCUTANEOUS | 3 refills | Status: AC
  Filled 2023-11-13: qty 3, 56d supply, fill #0

## 2023-11-13 MED ORDER — LANTUS SOLOSTAR 100 UNIT/ML ~~LOC~~ SOPN
30.0000 [IU] | PEN_INJECTOR | Freq: Every day | SUBCUTANEOUS | 3 refills | Status: AC
  Filled 2023-11-13: qty 15, 50d supply, fill #0

## 2023-11-13 MED ORDER — INSULIN ASPART 100 UNIT/ML FLEXPEN
12.0000 [IU] | PEN_INJECTOR | Freq: Two times a day (BID) | SUBCUTANEOUS | 3 refills | Status: DC
  Filled 2023-11-13: qty 6, 25d supply, fill #0

## 2023-11-13 MED ORDER — DEXCOM G7 SENSOR MISC
3 refills | Status: DC
  Filled 2023-11-13: qty 1, fill #0

## 2023-11-13 NOTE — Patient Instructions (Addendum)
 Thank you so much for coming to the clinic today!   We are increasing your Lantus  to 30U, and your novolog  to 12U with meals I am also sending in Ozempic  which you'll take weekly as an injection, as well as a glucose sensor I have placed a new referral to the blood doctors  I am also sending in a medication called Nurtec which you can take when the migraines come on, and only then.   If you have any questions please feel free to the call the clinic at anytime at [phone]. It was a pleasure seeing you!  Best, Dr. Jaydence Arnesen

## 2023-11-13 NOTE — Telephone Encounter (Signed)
 Prior Authorization for patient (Ozempic  (0.25 or 0.5 MG/DOSE) 2MG /3ML pen-injectors) came through on cover my meds was submitted with last office notes and labs awaiting approval or denial.  XZB:[id number]

## 2023-11-13 NOTE — Telephone Encounter (Signed)
 Prior Authorization for patient (Nurtec 75MG  dispersible tablets) came through on cover my meds was submitted with last office notes awaiting approval or denial.  KEY:[id number]

## 2023-11-13 NOTE — Telephone Encounter (Signed)
 Dana Hughes (Key: [id number]) Rx #: [id number] Ozempic  (0.25 or 0.5 MG/DOSE) 2MG [name] pen-injectors Form [employer] Electronic Prior Authorization Form Created Sent to Plan Plan Response Submit Clinical Questions Determination Favorable Message from Plan Approved. OZEMPIC  (0.25 OR 0.5 MG/DOSE) 2MG /3ML Soln Pen-inj is approved from 11/13/2023 to 11/12/2024. All strengths of the drug are approved.. Authorization Expiration Date: November 12, 2024.

## 2023-11-13 NOTE — Assessment & Plan Note (Signed)
 Long history of scoliosis, will place referral to spine surgeon for further evaluation.

## 2023-11-13 NOTE — Telephone Encounter (Signed)
 Prior Authorization for patient (Dexcom G7 Sensor) came through on cover my meds was submitted with last office notes and labs awaiting approval or denial.  KEY:[id number]

## 2023-11-13 NOTE — Progress Notes (Signed)
 CC: Chronic condition follow up  HPI:  [name] is a 43 y.o. female living with a history stated below and presents today for chronic condition follow up. Please see problem based assessment and plan for additional details.  Past Medical History:  Diagnosis Date   Anxiety    Arthritis    Asthma    BPPV (benign paroxysmal positional vertigo) 05/18/2022   Bronchitis    BV (bacterial vaginosis)    Candidiasis    Carpal tunnel syndrome, bilateral 01/16/2019   Chest pain of uncertain etiology 07/18/2021   Constipation 12/10/2020   Cystocele without uterine prolapse    Depression    Diabetes mellitus without complication (HCC)    Endometriosis    Exertional dyspnea 06/24/2021   Headache(784.0)    migraines   Hypertension    no longer takes meds   Migraines    Mild concentric left ventricular hypertrophy (LVH) 08/12/2020   Echo (07/2020):  1. Left ventricular ejection fraction, by estimation, is 45 to 50%. The  left ventricle has mildly decreased function. The left ventricle  demonstrates global hypokinesis. There is mild asymmetric left ventricular  hypertrophy of the basal-septal segment.  The interventricular septum is flattened in systole, consistent with right  ventricular pressure overload.   2. Right ve   Muscle spasm    Ovarian cyst    Pelvic pain in female 09/29/2015   Rectal bleeding 12/09/2020   FHx:   Father: colon cancer (44 yo)  Paternal aunt: colon cancer (~68 yo), breast cancer (unknown age).  Paternal gma: Breast cancer   Paternal 2nd cousin: Ovarian cancer     Colonoscopy (07/2020):  Perianal and digital rectal examinations: normal.  Findings:  - A patchy area of moderately nodular mucosa was found at the hepatic flexure and in the ascending colon.  - A patchy area of moderately no   Scoliosis    Thrombocytosis 08/12/2020   Uterine fibroid    Vulvovaginal candidiasis 05/15/2018    Current Outpatient Medications on File Prior to Visit   Medication Sig Dispense Refill   acetaminophen  (TYLENOL ) 500 MG tablet Take 2 tablets (1,000 mg total) by mouth every 6 (six) hours as needed for mild pain (or Fever >/= 101). 30 tablet 0   albuterol  (PROVENTIL ) (2.5 MG/3ML) 0.083% nebulizer solution Take 3 mLs (2.5 mg total) by nebulization every 6 (six) hours as needed for wheezing or shortness of breath. 75 mL 0   albuterol  (VENTOLIN  HFA) 108 (90 Base) MCG/ACT inhaler Inhale 2 puffs into the lungs every 6 (six) hours as needed for wheezing or shortness of breath. Reported on 09/29/2015     ARIPiprazole  (ABILIFY ) 20 MG tablet Take 1 tablet (20 mg total) by mouth daily. (Patient taking differently: Take 20 mg by mouth at bedtime.) 30 tablet 3   baclofen  (LIORESAL ) 20 MG tablet Take 1 tablet (20 mg total) by mouth 3 (three) times daily. 30 each 0   chlorhexidine  (PERIDEX ) 0.12 % solution Rinse with 15ml by mouth for 1 minute then spit, 2 (two) times daily for 10 days. 473 mL 0   dicyclomine  (BENTYL ) 20 MG tablet Take 1 tablet (20 mg total) by mouth 3 (three) times daily as needed for spasms. 90 tablet 3   famotidine  (PEPCID ) 20 MG tablet Take 1 tablet (20 mg total) by mouth 2 (two) times daily. 30 tablet 0   fluconazole  (DIFLUCAN ) 150 MG tablet Take 1 tablet (150 mg total) by mouth daily. If symptoms persist take second dose of  Diflucan  150 mg on day 3 to treat vaginal Candida. 2 tablet 0   furosemide  (LASIX ) 20 MG tablet Take 2 tablets (40 mg total) by mouth daily. 60 tablet 0   gabapentin  (NEURONTIN ) 300 MG capsule Take 1 capsule (300 mg total) by mouth 3 (three) times daily. (Patient taking differently: Take 300 mg by mouth 2 (two) times daily.) 90 capsule 3   ibuprofen  (ADVIL ) 800 MG tablet Take 1 tablet every 8 hours as needed for pain 20 tablet 0   lamoTRIgine  (LAMICTAL ) 150 MG tablet Take 1 tablet (150 mg total) by mouth daily. 30 tablet 3   lidocaine  (HM LIDOCAINE  PATCH) 4 % Place 1 patch onto the skin daily as needed. 6 patch 6   lidocaine   (XYLOCAINE ) 5 % ointment Apply 1 Application topically as needed. 35.44 g 0   metFORMIN  (GLUCOPHAGE ) 1000 MG tablet Take 1 tablet (1,000 mg total) by mouth 2 (two) times daily with a meal. 90 tablet 3   metroNIDAZOLE  (FLAGYL ) 500 MG tablet Take 1 tablet (500 mg total) by mouth 2 (two) times daily. 14 tablet 0   mirtazapine  (REMERON ) 45 MG tablet Take 1 tablet (45 mg total) by mouth at bedtime. 30 tablet 3   mometasone -formoterol  (DULERA ) 200-5 MCG/ACT AERO Inhale 2 puffs into the lungs 2 (two) times daily. 13 g 2   omeprazole  (PRILOSEC) 40 MG capsule Take 1 capsule (40 mg total) by mouth daily. 30 capsule 1   potassium chloride  (KLOR-CON ) 10 MEQ tablet Take 1 tablet (10 mEq total) by mouth daily. 5 tablet 0   sucralfate  (CARAFATE ) 1 g tablet Take 1 tablet (1 g total) by mouth 4 (four) times daily -  with meals and at bedtime. 21 tablet 0   SUMAtriptan  (IMITREX ) 50 MG tablet Take 1 tablet (50 mg total) by mouth as needed for migraine. May repeat in 2 hours if headache persists or recurs. Do not take more than 2 tablets per day. 60 tablet 3   Tasimelteon  (HETLIOZ ) 20 MG CAPS Take 20 mg by mouth at bedtime. 30 capsule 3   No current facility-administered medications on file prior to visit.    Family History  Problem Relation Age of Onset   Migraines Mother    Cancer Father    Colon cancer Father    Lupus Paternal Grandmother    Cancer Paternal Grandmother    Breast cancer Paternal Grandmother    Migraines Maternal Aunt    Hypertension Maternal Aunt    Breast cancer Maternal Aunt    Colon cancer Paternal Aunt    Breast cancer Paternal Aunt    Ovarian cancer Cousin     Social History   Socioeconomic History   Marital status: Single    Spouse name: Not on file   Number of children: Not on file   Years of education: Not on file   Highest education level: Not on file  Occupational History   Not on file  Tobacco Use   Smoking status: Former    Current packs/day: 0.00    Types:  Cigarettes    Quit date: 07/2023    Years since quitting: 0.3   Smokeless tobacco: Never   Tobacco comments:    1-2 cigars/week  Vaping Use   Vaping status: Never Used  Substance and Sexual Activity   Alcohol use: Not Currently    Comment: occasional   Drug use: Yes    Frequency: 7.0 times per week    Types: Marijuana   Sexual activity: Yes  Birth control/protection: Condom, Surgical  Other Topics Concern   Not on file  Social History Narrative   Not on file   Social Drivers of Health   Financial Resource Strain: Medium Risk (11/08/2021)   Overall Financial Resource Strain (CARDIA)    Difficulty of Paying Living Expenses: Somewhat hard  Food Insecurity: Food Insecurity Present (05/02/2023)   Hunger Vital Sign    Worried About Running Out of Food in the Last Year: Sometimes true    Ran Out of Food in the Last Year: Sometimes true  Transportation Needs: No Transportation Needs (05/02/2023)   PRAPARE - Administrator, Civil Service (Medical): No    Lack of Transportation (Non-Medical): No  Physical Activity: Not on file  Stress: Not on file  Social Connections: Moderately Isolated (08/20/2022)   Social Connection and Isolation Panel    Frequency of Communication with Friends and Family: More than three times a week    Frequency of Social Gatherings with Friends and Family: More than three times a week    Attends Religious Services: 1 to 4 times per year    Active Member of [employer] or Organizations: No    Attends Banker Meetings: Never    Marital Status: Never married  Intimate Partner Violence: Not At Risk (05/02/2023)   Humiliation, Afraid, Rape, and Kick questionnaire    Fear of Current or Ex-Partner: No    Emotionally Abused: No    Physically Abused: No    Sexually Abused: No    Review of Systems: ROS negative except for what is noted on the assessment and plan.  Vitals:   11/13/23 1021  BP: 137/78  Pulse: 74  Temp: 98.1 F (36.7 C)   TempSrc: Oral  SpO2: 100%  Weight: 200 lb (90.7 kg)  Height: 5' 3.5 (1.613 m)    Physical Exam: Constitutional: well-appearing female  in no acute distress Cardiovascular: regular rate and rhythm, no m/r/g Pulmonary/Chest: normal work of breathing on room air, lungs clear to auscultation bilaterally Abdominal: soft, non-tender, non-distended Neurological: alert & oriented x 3, 5/5 strength in bilateral upper and lower extremities, normal gait Skin: warm and dry Psych: normal mood and affect  Assessment & Plan:   Diabetes mellitus, type 2 (HCC) Last A1c 11.2 in March 2025, her regimen is Lantus  26U, metformin  1000mg  BID, and novolog  10U twice a day with meals, which she states that she has been taking. Her A1c today is 11.1 essentially the same. She uses a fingerstick method to check her glucose at home and she reports fasting glucose in the 200 range, with post-prandial in the 400 range sometimes.   I think we need to be more aggressive with her glycemic control. Will increase her Lantus  to 30U, her novolog  to 12U BID with meals, and start Ozempic . Have also sent in a Dexcom for her to help track her sugars.  History of pulmonary embolism Hx of multiple pulmonary embolisms, refilled her Eliquis  for this. Etiology of PE unclear, will place referral to Heme for further evaluation.   Hypertension Refilled her amlodipine , did not take this morning. BP 137/78  Scoliosis Long history of scoliosis, will place referral to spine surgeon for further evaluation.   Migraine without status migrainosus, not intractable Not doing well with the sumatriptan , as she is taking it daily and even more than once a day. Migraines have increased in frequency with now coming nightly. Query for medication induced migraines. Will start her on second line medication of Nurtec,  reinforced the utility of only taking medications when necessary, and not taking it more than recommended.   Plan:  - Start  nurtec   Patient discussed with Dr. Karna Dirks Dana Hughes, M.D. [facility], PGY-3 Pager: [phone] Date 11/13/2023 Time 3:41 PM

## 2023-11-13 NOTE — Assessment & Plan Note (Addendum)
 Last A1c 11.2 in March 2025, her regimen is Lantus  26U, metformin  1000mg  BID, and novolog  10U twice a day with meals, which she states that she has been taking. Her A1c today is 11.1 essentially the same. She uses a fingerstick method to check her glucose at home and she reports fasting glucose in the 200 range, with post-prandial in the 400 range sometimes.   I think we need to be more aggressive with her glycemic control. Will increase her Lantus  to 30U, her novolog  to 12U BID with meals, and start Ozempic . Have also sent in a Dexcom for her to help track her sugars.

## 2023-11-13 NOTE — Assessment & Plan Note (Signed)
 Refilled her amlodipine , did not take this morning. BP 137/78

## 2023-11-13 NOTE — Assessment & Plan Note (Addendum)
 Not doing well with the sumatriptan , as she is taking it daily and even more than once a day. Migraines have increased in frequency with now coming nightly. Query for medication induced migraines. Will start her on second line medication of Nurtec, reinforced the utility of only taking medications when necessary, and not taking it more than recommended.   Plan:  - Start nurtec

## 2023-11-13 NOTE — Assessment & Plan Note (Addendum)
 Hx of multiple pulmonary embolisms, refilled her Eliquis  for this. Etiology of PE unclear, will place referral to Heme for further evaluation.

## 2023-11-14 ENCOUNTER — Other Ambulatory Visit: Payer: Self-pay

## 2023-11-14 LAB — MICROALBUMIN / CREATININE URINE RATIO
Creatinine, Urine: 81.6 mg/dL
Microalb/Creat Ratio: 9 mg/g{creat} (ref 0–29)
Microalbumin, Urine: 7 ug/mL

## 2023-11-14 NOTE — Telephone Encounter (Signed)
 Dana Hughes (Key: [id number]) PA Case ID #: [id number] Rx #: [id number] Need Help? Call us  at [phone] Outcome Denied on July 23 by [employer] 2017 Denied Drug Nurtec 75MG  dispersible tablets ePA cloud logo Form [employer] Electronic Prior Authorization Form Original Claim Info 75

## 2023-11-14 NOTE — Telephone Encounter (Signed)
 Dana Hughes (Key: [id number]) PA Case ID #: [id number] Rx #: [id number] Need Help? Call us  at [phone] Outcome Approved on July 23 by [employer] 2017 Approved. DEXCOM G7 SENSOR Misc is approved from 11/13/2023 to 05/15/2024. All strengths of the drug are approved. Effective Date: 11/13/2023 Authorization Expiration Date: 05/15/2024 Drug Dexcom G7 Sensor ePA cloud logo Form [employer] Electronic Prior Authorization Form Original Claim Info 75

## 2023-11-15 NOTE — Progress Notes (Signed)
 Internal Medicine Clinic Attending  Case discussed with the resident at the time of the visit.  We reviewed the resident's history and exam and pertinent patient test results.  I agree with the assessment, diagnosis, and plan of care documented in the resident's note.

## 2023-11-16 ENCOUNTER — Other Ambulatory Visit: Payer: Self-pay

## 2023-11-18 ENCOUNTER — Other Ambulatory Visit: Payer: Self-pay

## 2023-11-19 ENCOUNTER — Other Ambulatory Visit: Payer: Self-pay

## 2023-11-19 MED ORDER — METHOCARBAMOL 500 MG PO TABS
500.0000 mg | ORAL_TABLET | Freq: Three times a day (TID) | ORAL | 2 refills | Status: AC
  Filled 2023-11-19: qty 90, 30d supply, fill #0
  Filled 2023-12-31: qty 90, 30d supply, fill #1

## 2023-11-22 ENCOUNTER — Other Ambulatory Visit: Payer: Self-pay

## 2023-11-27 NOTE — Telephone Encounter (Signed)
 Called [facility]  and spoke with [place]. Referral has been reviewed and a Denial Response letter will be sent to the PCP as no appointment will be given.   Copied from CRM #[id number]. Topic: Referral - Status >> Nov 26, 2023  4:25 PM Susanna ORN wrote: Reason for CRM: Renda, with [facility] called stating that they received a referral for patient. She states the  referral was for patient to see Dr. Darlis but she stated that Dr. Darlis doesn't have anything to off her.

## 2023-12-13 ENCOUNTER — Institutional Professional Consult (permissible substitution): Payer: MEDICAID | Admitting: Plastic Surgery

## 2023-12-25 NOTE — Progress Notes (Deleted)
 [facility] CONSULT NOTE  Patient Care Team: Dana Pontiff, DO as PCP - General Dana Ronal BRAVO, Dana Hughes (Inactive) as PCP - Cardiology (Cardiology)   ASSESSMENT & PLAN:  43 y.o.female with history of *** referred to [facility] for history of ***.   Assessment & Plan   First episode: Second episode: Third episode: Setting: Treatment: Risk factors: uncontrolled diabetes, recurrence VTE.  Patient education for risk factors and prevention of clotting We talked about modifiable risk factors.  Prevention of clotting like deep vein thrombosis including: Strong risk factors including fractures of lower limb, hospitalization for severe illness, such as heart failure, myocardial infarction, spinal cord injury, major trauma, hip or knee replacement, and previous VTE. avoid prolonged immobilization and moving extremities every 1-2 hours during long car rides or flights.  Taking a break and moving extremities if working in a job setting with prolonged sitting.   Avoid dehydration, especially in high altitude. Avoid cigarette smoking Avoid use of hormone replacement therapy, estrogen-containing contraceptive in women.   Maintaining healthy lifestyle to prevent development of diabetes.  Weight loss if BMI over 30.  Regular exercises but not extreme.   Other risk factors for clotting are surgery, hospitalization, inflammatory disease or severe infection, trauma or injuries from inflammatory state and stasis. If developing one-sided leg swelling, pain, color change, chest pain, sudden short of breath, difficulty taking deep breaths, taking deep breath with chest discomfort or pain, dizziness or heart racing sensation, go to the emergency room immediately for evaluation. If developing trauma, uncontrolled bleeding, such as bloody stools report ED immediately. Avoid NSAIDs, aspirin  while on blood thinner.  Patient should avoid elective surgery in the acute  thrombosis period for 3 months.  Discussed bleeding precautions and avoid high risk activities for falling while on anticoagulation. Anticoagulants do not cause bleeding.  Rather, if bleeding occurs when one is on anticoagulation, it may take longer for the bleeding to stop due to reduce coagulation capacity.    I reviewed with the patient about the plan for care for recurrent ***DVT/PE.  For patients with unprovoked VTE, the risks of recurrent VTE after completing a course of anticoagulant therapy have been estimated to be 10% by 2 years and >30% by 10 years. ASH 2020. Therefore, I do not see a reason to order excessive testing to screen for thrombophilia disorder as it would not change our management. The goal of anticoagulation therapy is for life.   Finally, at the end of our consultation today, I reinforced the importance of preventive strategies such as avoiding hormonal supplement, avoiding cigarette smoking, keeping up-to-date with screening programs for early cancer detection, frequent ambulation for long distance travel and aggressive DVT prophylaxis in all surgical settings.  ***I have not made a return appointment for the patient to come back. I would be happy to assist in perioperative DVT management in the future should she need any interruption of her anticoagulation therapy for elective procedures.  No orders of the defined types were placed in this encounter.   All questions were answered. The patient knows to call the clinic with any problems, questions or concerns. The total time spent in the appointment was {CHL ONC TIME VISIT - DTPQU:[id number]} encounter with patients including review of chart and various tests results, discussions about plan of care and coordination of care plan  Dana JAYSON Chihuahua, Dana Hughes 9/3/202510:40 PM  CHIEF COMPLAINTS/PURPOSE OF CONSULTATION:  ***  HISTORY OF PRESENTING ILLNESS:  Dana Hughes 43 y.o.  female is here because of ***  ***She denies  ***lower extremity swelling, warmth, tenderness & erythema.  She denies recent chest pain on exertion, shortness of breath on minimal exertion, pre-syncopal episodes, hemoptysis, or palpitation. ***She denies recent *** history of trauma, *** long distance travel, dehydration, recent surgery, ***smoking or prolonged immobilization. ***She had no prior history or diagnosis of cancer. Her age appropriate screening programs are up-to-date. ***She had prior surgeries before and never had perioperative thromboembolic events. ***The patient had been exposed to birth control pills *** hormone replacement therapy *** testosterone replacement therapy and never had thrombotic events. ***The patient had been pregnant before and denies history of peripartum thromboembolic event or history of recurrent miscarriages. ***There is no family history of blood clots or miscarriages.  MEDICAL HISTORY:  Past Medical History:  Diagnosis Date   Anxiety    Arthritis    Asthma    BPPV (benign paroxysmal positional vertigo) 05/18/2022   Bronchitis    BV (bacterial vaginosis)    Candidiasis    Carpal tunnel syndrome, bilateral 01/16/2019   Chest pain of uncertain etiology 07/18/2021   Constipation 12/10/2020   Cystocele without uterine prolapse    Depression    Diabetes mellitus without complication (HCC)    Endometriosis    Exertional dyspnea 06/24/2021   Headache(784.0)    migraines   Hypertension    no longer takes meds   Migraines    Mild concentric left ventricular hypertrophy (LVH) 08/12/2020   Echo (07/2020):  1. Left ventricular ejection fraction, by estimation, is 45 to 50%. The  left ventricle has mildly decreased function. The left ventricle  demonstrates global hypokinesis. There is mild asymmetric left ventricular  hypertrophy of the basal-septal segment.  The interventricular septum is flattened in systole, consistent with right  ventricular pressure overload.   2. Right ve   Muscle spasm     Ovarian cyst    Pelvic pain in female 09/29/2015   Rectal bleeding 12/09/2020   FHx:   Father: colon cancer (20 yo)  Paternal aunt: colon cancer (~15 yo), breast cancer (unknown age).  Paternal gma: Breast cancer   Paternal 2nd cousin: Ovarian cancer     Colonoscopy (07/2020):  Perianal and digital rectal examinations: normal.  Findings:  - A patchy area of moderately nodular mucosa was found at the hepatic flexure and in the ascending colon.  - A patchy area of moderately no   Scoliosis    Thrombocytosis 08/12/2020   Uterine fibroid    Vulvovaginal candidiasis 05/15/2018    SURGICAL HISTORY: Past Surgical History:  Procedure Laterality Date   BIOPSY  08/12/2020   Procedure: BIOPSY;  Surgeon: Shila Gustav GAILS, Dana Hughes;  Location: [facility] ENDOSCOPY;  Service: Endoscopy;;   CHOLECYSTECTOMY     COLONOSCOPY WITH PROPOFOL  N/A 08/12/2020   Procedure: COLONOSCOPY WITH PROPOFOL ;  Surgeon: Shila Gustav GAILS, Dana Hughes;  Location: [facility] ENDOSCOPY;  Service: Endoscopy;  Laterality: N/A;   ESOPHAGOGASTRODUODENOSCOPY (EGD) WITH PROPOFOL  N/A 08/12/2020   Procedure: ESOPHAGOGASTRODUODENOSCOPY (EGD) WITH PROPOFOL ;  Surgeon: Shila Gustav GAILS, Dana Hughes;  Location: [facility] ENDOSCOPY;  Service: Endoscopy;  Laterality: N/A;   IR ANGIOGRAM PULMONARY BILATERAL SELECTIVE  08/22/2022   IR ANGIOGRAM SELECTIVE EACH ADDITIONAL VESSEL  08/22/2022   IR ANGIOGRAM SELECTIVE EACH ADDITIONAL VESSEL  08/22/2022   IR INFUSION THROMBOL ARTERIAL INITIAL (MS)  08/13/2022   IR THROMB F/U EVAL ART/VEN FINAL DAY (MS)  08/14/2022   IR US  GUIDE VASC ACCESS RIGHT  08/13/2022   POLYPECTOMY  08/12/2020   Procedure:  POLYPECTOMY;  Surgeon: Shila Gustav GAILS, Dana Hughes;  Location: [facility] ENDOSCOPY;  Service: Endoscopy;;   TUBAL LIGATION      SOCIAL HISTORY: Social History   Socioeconomic History   Marital status: Single    Spouse name: Not on file   Number of children: Not on file   Years of education: Not on file   Highest education level: Not on file  Occupational  History   Not on file  Tobacco Use   Smoking status: Former    Current packs/day: 0.00    Types: Cigarettes    Quit date: 07/2023    Years since quitting: 0.4   Smokeless tobacco: Never   Tobacco comments:    1-2 cigars/week  Vaping Use   Vaping status: Never Used  Substance and Sexual Activity   Alcohol use: Not Currently    Comment: occasional   Drug use: Yes    Frequency: 7.0 times per week    Types: Marijuana   Sexual activity: Yes    Birth control/protection: Condom, Surgical  Other Topics Concern   Not on file  Social History Narrative   Not on file   Social Drivers of Health   Financial Resource Strain: High Risk (11/19/2023)   Received from [facility]   Overall Financial Resource Strain (CARDIA)    How hard is it for you to pay for the very basics like food, housing, medical care, and heating?: Hard  Food Insecurity: Food Insecurity Present (11/19/2023)   Received from [facility]   Hunger Vital Sign    Within the past 12 months, you worried that your food would run out before you got the money to buy more.: Often true    Within the past 12 months, the food you bought just didn't last and you didn't have money to get more.: Often true  Transportation Needs: Unmet Transportation Needs (11/19/2023)   Received from [facility] - Transportation    In the past 12 months, has lack of transportation kept you from medical appointments or from getting medications?: Yes    In the past 12 months, has lack of transportation kept you from meetings, work, or from getting things needed for daily living?: Yes  Physical Activity: Not on file  Stress: Not on file  Social Connections: Moderately Isolated (08/20/2022)   Social Connection and Isolation Panel    Frequency of Communication with Friends and Family: More than three times a week    Frequency of Social Gatherings with Friends and Family: More than three times a week    Attends Religious Services: 1 to 4  times per year    Active Member of [employer] or Organizations: No    Attends Banker Meetings: Never    Marital Status: Never married  Intimate Partner Violence: Not At Risk (05/02/2023)   Humiliation, Afraid, Rape, and Kick questionnaire    Fear of Current or Ex-Partner: No    Emotionally Abused: No    Physically Abused: No    Sexually Abused: No    FAMILY HISTORY: Family History  Problem Relation Age of Onset   Migraines Mother    Cancer Father    Colon cancer Father    Lupus Paternal Grandmother    Cancer Paternal Grandmother    Breast cancer Paternal Grandmother    Migraines Maternal Aunt    Hypertension Maternal Aunt    Breast cancer Maternal Aunt    Colon cancer Paternal Aunt    Breast cancer Paternal Aunt  Ovarian cancer Cousin     ALLERGIES:  is allergic to mushroom extract complex (obsolete), latex, penicillins, and wound dressing adhesive.  MEDICATIONS:  Current Outpatient Medications  Medication Sig Dispense Refill   acetaminophen  (TYLENOL ) 500 MG tablet Take 2 tablets (1,000 mg total) by mouth every 6 (six) hours as needed for mild pain (or Fever >/= 101). 30 tablet 0   albuterol  (PROVENTIL ) (2.5 MG/3ML) 0.083% nebulizer solution Take 3 mLs (2.5 mg total) by nebulization every 6 (six) hours as needed for wheezing or shortness of breath. 75 mL 0   albuterol  (VENTOLIN  HFA) 108 (90 Base) MCG/ACT inhaler Inhale 2 puffs into the lungs every 6 (six) hours as needed for wheezing or shortness of breath. Reported on 09/29/2015     amLODipine  (NORVASC ) 5 MG tablet Take 1 tablet (5 mg total) by mouth daily. 90 tablet 3   apixaban  (ELIQUIS ) 5 MG TABS tablet Take 1 tablet (5 mg total) by mouth 2 (two) times daily. 60 tablet 2   ARIPiprazole  (ABILIFY ) 20 MG tablet Take 1 tablet (20 mg total) by mouth daily. (Patient taking differently: Take 20 mg by mouth at bedtime.) 30 tablet 3   baclofen  (LIORESAL ) 20 MG tablet Take 1 tablet (20 mg total) by mouth 3 (three) times  daily. 30 each 0   chlorhexidine  (PERIDEX ) 0.12 % solution Rinse with 15ml by mouth for 1 minute then spit, 2 (two) times daily for 10 days. 473 mL 0   Continuous Glucose Sensor (DEXCOM G7 SENSOR) MISC Please use to check sugar and change sensor every 10 days 3 each 3   dicyclomine  (BENTYL ) 20 MG tablet Take 1 tablet (20 mg total) by mouth 3 (three) times daily as needed for spasms. 90 tablet 3   famotidine  (PEPCID ) 20 MG tablet Take 1 tablet (20 mg total) by mouth 2 (two) times daily. 30 tablet 0   fluconazole  (DIFLUCAN ) 150 MG tablet Take 1 tablet (150 mg total) by mouth daily. If symptoms persist take second dose of Diflucan  150 mg on day 3 to treat vaginal Candida. 2 tablet 0   furosemide  (LASIX ) 20 MG tablet Take 2 tablets (40 mg total) by mouth daily. 60 tablet 0   gabapentin  (NEURONTIN ) 300 MG capsule Take 1 capsule (300 mg total) by mouth 3 (three) times daily. (Patient taking differently: Take 300 mg by mouth 2 (two) times daily.) 90 capsule 3   ibuprofen  (ADVIL ) 800 MG tablet Take 1 tablet every 8 hours as needed for pain 20 tablet 0   insulin  aspart (NOVOLOG ) 100 UNIT/ML FlexPen Inject 12 Units into the skin 2 (two) times daily with a meal. 6 mL 3   insulin  glargine (LANTUS  SOLOSTAR) 100 UNIT/ML Solostar Pen Inject 30 Units into the skin daily. 15 mL 3   lamoTRIgine  (LAMICTAL ) 150 MG tablet Take 1 tablet (150 mg total) by mouth daily. 30 tablet 3   lidocaine  (HM LIDOCAINE  PATCH) 4 % Place 1 patch onto the skin daily as needed. 6 patch 6   lidocaine  (XYLOCAINE ) 5 % ointment Apply 1 Application topically as needed. 35.44 g 0   metFORMIN  (GLUCOPHAGE ) 1000 MG tablet Take 1 tablet (1,000 mg total) by mouth 2 (two) times daily with a meal. 90 tablet 3   methocarbamol  (ROBAXIN ) 500 MG tablet Take 1 tablet (500 mg total) by mouth 3 (three) times daily as needed (muscle spasms). 90 tablet 2   metroNIDAZOLE  (FLAGYL ) 500 MG tablet Take 1 tablet (500 mg total) by mouth 2 (two) times daily. 14  tablet 0    mirtazapine  (REMERON ) 45 MG tablet Take 1 tablet (45 mg total) by mouth at bedtime. 30 tablet 3   mometasone -formoterol  (DULERA ) 200-5 MCG/ACT AERO Inhale 2 puffs into the lungs 2 (two) times daily. 13 g 2   omeprazole  (PRILOSEC) 40 MG capsule Take 1 capsule (40 mg total) by mouth daily. 30 capsule 1   potassium chloride  (KLOR-CON ) 10 MEQ tablet Take 1 tablet (10 mEq total) by mouth daily. 5 tablet 0   Rimegepant Sulfate  (NURTEC) 75 MG TBDP Take 1 tablet (75 mg total) by mouth once as needed for up to 1 dose. Max 75mg  in 24 hours 30 tablet 3   Semaglutide ,0.25 or 0.5MG /DOS, 2 MG/3ML SOPN Inject 0.25 mg into the skin once a week. 3 mL 3   sucralfate  (CARAFATE ) 1 g tablet Take 1 tablet (1 g total) by mouth 4 (four) times daily -  with meals and at bedtime. 21 tablet 0   SUMAtriptan  (IMITREX ) 50 MG tablet Take 1 tablet (50 mg total) by mouth as needed for migraine. May repeat in 2 hours if headache persists or recurs. Do not take more than 2 tablets per day. 60 tablet 3   Tasimelteon  (HETLIOZ ) 20 MG CAPS Take 20 mg by mouth at bedtime. 30 capsule 3   No current facility-administered medications for this visit.    REVIEW OF SYSTEMS:   All relevant systems were reviewed with the patient and are negative.  PHYSICAL EXAMINATION: ECOG PERFORMANCE STATUS: {CHL ONC ECOG PS:[id number]}  There were no vitals filed for this visit. There were no vitals filed for this visit.  GENERAL: alert, no distress and comfortable SKIN: skin color normal LUNGS: Effort normal and no respiratory distress.  Clear to auscultation HEART: regular rate & rhythm ABDOMEN: abdomen soft, non-tender Musculoskeletal: no cyanosis and edema.  Right lower extremity *** left lower extremity ***  LABORATORY DATA:  I have reviewed the data as listed   RADIOGRAPHIC STUDIES: I have personally reviewed the radiological images as listed and agreed with the findings in the report. No results found.

## 2023-12-26 ENCOUNTER — Inpatient Hospital Stay: Payer: MEDICAID

## 2023-12-31 ENCOUNTER — Other Ambulatory Visit: Payer: Self-pay

## 2024-01-05 ENCOUNTER — Encounter: Payer: Self-pay | Admitting: Student

## 2024-01-12 NOTE — Progress Notes (Deleted)
 [name] CONSULT NOTE  Patient Care Team: [name], DO as PCP - General [name], MD (Inactive) as PCP - Cardiology (Cardiology)   ASSESSMENT & PLAN:  43 y.o.female with history of PE, HTN, DM2, asthma referred to [facility] for history of ***.   Assessment & Plan   First episode: Second episode: Third episode: Setting: Treatment: Risk factors: uncontrolled diabetes, recurrence VTE.  Patient education for risk factors and prevention of clotting We talked about modifiable risk factors.  Prevention of clotting like deep vein thrombosis including: Strong risk factors including fractures of lower limb, hospitalization for severe illness, such as heart failure, myocardial infarction, spinal cord injury, major trauma, hip or knee replacement, and previous VTE. avoid prolonged immobilization and moving extremities every 1-2 hours during long car rides or flights.  Taking a break and moving extremities if working in a job setting with prolonged sitting.   Avoid dehydration, especially in high altitude. Avoid cigarette smoking Avoid use of hormone replacement therapy, estrogen-containing contraceptive in women.   Maintaining healthy lifestyle to prevent development of diabetes.  Weight loss if BMI over 30.  Regular exercises but not extreme.   Other risk factors for clotting are surgery, hospitalization, inflammatory disease or severe infection, trauma or injuries from inflammatory state and stasis. If developing one-sided leg swelling, pain, color change, chest pain, sudden short of breath, difficulty taking deep breaths, taking deep breath with chest discomfort or pain, dizziness or heart racing sensation, go to the emergency room immediately for evaluation. If developing trauma, uncontrolled bleeding, such as bloody stools report ED immediately. Avoid NSAIDs, aspirin  while on blood thinner.  Patient should avoid elective  surgery in the acute thrombosis period for 3 months.  Discussed bleeding precautions and avoid high risk activities for falling while on anticoagulation. Anticoagulants do not cause bleeding.  Rather, if bleeding occurs when one is on anticoagulation, it may take longer for the bleeding to stop due to reduce coagulation capacity.    I reviewed with the patient about the plan for care for recurrent ***DVT/PE.  For patients with unprovoked VTE, the risks of recurrent VTE after completing a course of anticoagulant therapy have been estimated to be 10% by 2 years and >30% by 10 years. ASH 2020. Therefore, I do not see a reason to order excessive testing to screen for thrombophilia disorder as it would not change our management. The goal of anticoagulation therapy is for life.   Finally, at the end of our consultation today, I reinforced the importance of preventive strategies such as avoiding hormonal supplement, avoiding cigarette smoking, keeping up-to-date with screening programs for early cancer detection, frequent ambulation for long distance travel and aggressive DVT prophylaxis in all surgical settings.  ***I have not made a return appointment for the patient to come back. I would be happy to assist in perioperative DVT management in the future should she need any interruption of her anticoagulation therapy for elective procedures.  No orders of the defined types were placed in this encounter.   All questions were answered. The patient knows to call the clinic with any problems, questions or concerns. The total time spent in the appointment was {CHL ONC TIME VISIT - DTPQU:[id number]} encounter with patients including review of chart and various tests results, discussions about plan of care and coordination of care plan  [name], MD 9/21/202510:22 PM  CHIEF COMPLAINTS/PURPOSE OF CONSULTATION:  ***  HISTORY OF PRESENTING ILLNESS:  Dana Hughes  Troutman 43 y.o. female is here because of  ***  08/08/20 CTA: Extensive bilateral pulmonary artery emboli involving the distal right main and interlobar pulmonary arteries as well as throughout the lobar, segmental and subsegmental branches of both the right and left lung. Positive for acute PE with CT evidence of right heart strain (RV/LV Ratio 1.2) consistent with at least submassive (intermediate risk) PE. The presence of right heart strain has been associated with an increased risk of morbidity and mortality.  08/12/22 CTA: 1. Marked severity bilateral pulmonary embolism with right heartvstrain (RV/LV Ratio = 1.31) which has been associated with an increased risk of morbidity and mortality. 2. Mild patchy right upper lobe and left lower lobe infiltrates. 3. Evidence of prior cholecystectomy.  At the time there were signs of right heart strain on echo which required thrombolysis. She was discharged on 08/16/2022 on Eliquis .   05/01/23 CTA: Pulmonary emboli are seen throughout segmental and subsegmental branches of the right lower lobe. Web-like pulmonary embolism in the distal left lower lobe pulmonary artery. No evidence for right heart strain.  ***She denies ***lower extremity swelling, warmth, tenderness & erythema.  She denies recent chest pain on exertion, shortness of breath on minimal exertion, pre-syncopal episodes, hemoptysis, or palpitation. ***She denies recent *** history of trauma, *** long distance travel, dehydration, recent surgery, ***smoking or prolonged immobilization. ***She had no prior history or diagnosis of cancer. Her age appropriate screening programs are up-to-date. ***She had prior surgeries before and never had perioperative thromboembolic events. ***The patient had been exposed to birth control pills *** hormone replacement therapy *** testosterone replacement therapy and never had thrombotic events. ***The patient had been pregnant before and denies history of peripartum thromboembolic event or history of  recurrent miscarriages. ***There is no family history of blood clots or miscarriages.  MEDICAL HISTORY:  Past Medical History:  Diagnosis Date   Anxiety    Arthritis    Asthma    BPPV (benign paroxysmal positional vertigo) 05/18/2022   Bronchitis    BV (bacterial vaginosis)    Candidiasis    Carpal tunnel syndrome, bilateral 01/16/2019   Chest pain of uncertain etiology 07/18/2021   Constipation 12/10/2020   Cystocele without uterine prolapse    Depression    Diabetes mellitus without complication (HCC)    Endometriosis    Exertional dyspnea 06/24/2021   Headache(784.0)    migraines   Hypertension    no longer takes meds   Migraines    Mild concentric left ventricular hypertrophy (LVH) 08/12/2020   Echo (07/2020):  1. Left ventricular ejection fraction, by estimation, is 45 to 50%. The  left ventricle has mildly decreased function. The left ventricle  demonstrates global hypokinesis. There is mild asymmetric left ventricular  hypertrophy of the basal-septal segment.  The interventricular septum is flattened in systole, consistent with right  ventricular pressure overload.   2. Right ve   Muscle spasm    Ovarian cyst    Pelvic pain in female 09/29/2015   Rectal bleeding 12/09/2020   FHx:   Father: colon cancer (70 yo)  Paternal aunt: colon cancer (~97 yo), breast cancer (unknown age).  Paternal gma: Breast cancer   Paternal 2nd cousin: Ovarian cancer     Colonoscopy (07/2020):  Perianal and digital rectal examinations: normal.  Findings:  - A patchy area of moderately nodular mucosa was found at the hepatic flexure and in the ascending colon.  - A patchy area of moderately no   Scoliosis    Thrombocytosis 08/12/2020  Uterine fibroid    Vulvovaginal candidiasis 05/15/2018    SURGICAL HISTORY: Past Surgical History:  Procedure Laterality Date   BIOPSY  08/12/2020   Procedure: BIOPSY;  Surgeon: Shila Gustav GAILS, MD;  Location: [facility];  Service: Endoscopy;;    CHOLECYSTECTOMY     COLONOSCOPY WITH PROPOFOL  N/A 08/12/2020   Procedure: COLONOSCOPY WITH PROPOFOL ;  Surgeon: Shila Gustav GAILS, MD;  Location: [facility];  Service: Endoscopy;  Laterality: N/A;   ESOPHAGOGASTRODUODENOSCOPY (EGD) WITH PROPOFOL  N/A 08/12/2020   Procedure: ESOPHAGOGASTRODUODENOSCOPY (EGD) WITH PROPOFOL ;  Surgeon: Shila Gustav GAILS, MD;  Location: [facility];  Service: Endoscopy;  Laterality: N/A;   IR ANGIOGRAM PULMONARY BILATERAL SELECTIVE  08/22/2022   IR ANGIOGRAM SELECTIVE EACH ADDITIONAL VESSEL  08/22/2022   IR ANGIOGRAM SELECTIVE EACH ADDITIONAL VESSEL  08/22/2022   IR INFUSION THROMBOL ARTERIAL INITIAL (MS)  08/13/2022   IR THROMB F/U EVAL ART/VEN FINAL DAY (MS)  08/14/2022   IR US  GUIDE VASC ACCESS RIGHT  08/13/2022   POLYPECTOMY  08/12/2020   Procedure: POLYPECTOMY;  Surgeon: Shila Gustav GAILS, MD;  Location: [facility];  Service: Endoscopy;;   TUBAL LIGATION      SOCIAL HISTORY: Social History   Socioeconomic History   Marital status: Single    Spouse name: Not on file   Number of children: Not on file   Years of education: Not on file   Highest education level: Not on file  Occupational History   Not on file  Tobacco Use   Smoking status: Former    Current packs/day: 0.00    Types: Cigarettes    Quit date: 07/2023    Years since quitting: 0.4   Smokeless tobacco: Never   Tobacco comments:    1-2 cigars/week  Vaping Use   Vaping status: Never Used  Substance and Sexual Activity   Alcohol use: Not Currently    Comment: occasional   Drug use: Yes    Frequency: 7.0 times per week    Types: Marijuana   Sexual activity: Yes    Birth control/protection: Condom, Surgical  Other Topics Concern   Not on file  Social History Narrative   Not on file   Social Drivers of Health   Financial Resource Strain: High Risk (11/19/2023)   Received from [facility]   Overall Financial Resource Strain (CARDIA)    How hard is it for you to pay for the very  basics like food, housing, medical care, and heating?: Hard  Food Insecurity: Food Insecurity Present (11/19/2023)   Received from [facility]   Hunger Vital Sign    Within the past 12 months, you worried that your food would run out before you got the money to buy more.: Often true    Within the past 12 months, the food you bought just didn't last and you didn't have money to get more.: Often true  Transportation Needs: Unmet Transportation Needs (11/19/2023)   Received from [facility] - Transportation    In the past 12 months, has lack of transportation kept you from medical appointments or from getting medications?: Yes    In the past 12 months, has lack of transportation kept you from meetings, work, or from getting things needed for daily living?: Yes  Physical Activity: Not on file  Stress: Not on file  Social Connections: Moderately Isolated (08/20/2022)   Social Connection and Isolation Panel    Frequency of Communication with Friends and Family: More than three times a week  Frequency of Social Gatherings with Friends and Family: More than three times a week    Attends Religious Services: 1 to 4 times per year    Active Member of [employer] or Organizations: No    Attends Banker Meetings: Never    Marital Status: Never married  Intimate Partner Violence: Not At Risk (05/02/2023)   Humiliation, Afraid, Rape, and Kick questionnaire    Fear of Current or Ex-Partner: No    Emotionally Abused: No    Physically Abused: No    Sexually Abused: No    FAMILY HISTORY: Family History  Problem Relation Age of Onset   Migraines Mother    Cancer Father    Colon cancer Father    Lupus Paternal Grandmother    Cancer Paternal Grandmother    Breast cancer Paternal Grandmother    Migraines Maternal Aunt    Hypertension Maternal Aunt    Breast cancer Maternal Aunt    Colon cancer Paternal Aunt    Breast cancer Paternal Aunt    Ovarian cancer Cousin      ALLERGIES:  is allergic to mushroom extract complex (obsolete), latex, penicillins, and wound dressing adhesive.  MEDICATIONS:  Current Outpatient Medications  Medication Sig Dispense Refill   acetaminophen  (TYLENOL ) 500 MG tablet Take 2 tablets (1,000 mg total) by mouth every 6 (six) hours as needed for mild pain (or Fever >/= 101). 30 tablet 0   albuterol  (PROVENTIL ) (2.5 MG/3ML) 0.083% nebulizer solution Take 3 mLs (2.5 mg total) by nebulization every 6 (six) hours as needed for wheezing or shortness of breath. 75 mL 0   albuterol  (VENTOLIN  HFA) 108 (90 Base) MCG/ACT inhaler Inhale 2 puffs into the lungs every 6 (six) hours as needed for wheezing or shortness of breath. Reported on 09/29/2015     amLODipine  (NORVASC ) 5 MG tablet Take 1 tablet (5 mg total) by mouth daily. 90 tablet 3   apixaban  (ELIQUIS ) 5 MG TABS tablet Take 1 tablet (5 mg total) by mouth 2 (two) times daily. 60 tablet 2   ARIPiprazole  (ABILIFY ) 20 MG tablet Take 1 tablet (20 mg total) by mouth daily. (Patient taking differently: Take 20 mg by mouth at bedtime.) 30 tablet 3   baclofen  (LIORESAL ) 20 MG tablet Take 1 tablet (20 mg total) by mouth 3 (three) times daily. 30 each 0   chlorhexidine  (PERIDEX ) 0.12 % solution Rinse with 15ml by mouth for 1 minute then spit, 2 (two) times daily for 10 days. 473 mL 0   Continuous Glucose Sensor (DEXCOM G7 SENSOR) MISC Please use to check sugar and change sensor every 10 days 3 each 3   dicyclomine  (BENTYL ) 20 MG tablet Take 1 tablet (20 mg total) by mouth 3 (three) times daily as needed for spasms. 90 tablet 3   famotidine  (PEPCID ) 20 MG tablet Take 1 tablet (20 mg total) by mouth 2 (two) times daily. 30 tablet 0   fluconazole  (DIFLUCAN ) 150 MG tablet Take 1 tablet (150 mg total) by mouth daily. If symptoms persist take second dose of Diflucan  150 mg on day 3 to treat vaginal Candida. 2 tablet 0   furosemide  (LASIX ) 20 MG tablet Take 2 tablets (40 mg total) by mouth daily. 60 tablet 0    gabapentin  (NEURONTIN ) 300 MG capsule Take 1 capsule (300 mg total) by mouth 3 (three) times daily. (Patient taking differently: Take 300 mg by mouth 2 (two) times daily.) 90 capsule 3   ibuprofen  (ADVIL ) 800 MG tablet Take 1 tablet every  8 hours as needed for pain 20 tablet 0   insulin  aspart (NOVOLOG ) 100 UNIT/ML FlexPen Inject 12 Units into the skin 2 (two) times daily with a meal. 6 mL 3   insulin  glargine (LANTUS  SOLOSTAR) 100 UNIT/ML Solostar Pen Inject 30 Units into the skin daily. 15 mL 3   lamoTRIgine  (LAMICTAL ) 150 MG tablet Take 1 tablet (150 mg total) by mouth daily. 30 tablet 3   lidocaine  (HM LIDOCAINE  PATCH) 4 % Place 1 patch onto the skin daily as needed. 6 patch 6   lidocaine  (XYLOCAINE ) 5 % ointment Apply 1 Application topically as needed. 35.44 g 0   metFORMIN  (GLUCOPHAGE ) 1000 MG tablet Take 1 tablet (1,000 mg total) by mouth 2 (two) times daily with a meal. 90 tablet 3   methocarbamol  (ROBAXIN ) 500 MG tablet Take 1 tablet (500 mg total) by mouth 3 (three) times daily as needed (muscle spasms). 90 tablet 2   metroNIDAZOLE  (FLAGYL ) 500 MG tablet Take 1 tablet (500 mg total) by mouth 2 (two) times daily. 14 tablet 0   mirtazapine  (REMERON ) 45 MG tablet Take 1 tablet (45 mg total) by mouth at bedtime. 30 tablet 3   mometasone -formoterol  (DULERA ) 200-5 MCG/ACT AERO Inhale 2 puffs into the lungs 2 (two) times daily. 13 g 2   omeprazole  (PRILOSEC) 40 MG capsule Take 1 capsule (40 mg total) by mouth daily. 30 capsule 1   potassium chloride  (KLOR-CON ) 10 MEQ tablet Take 1 tablet (10 mEq total) by mouth daily. 5 tablet 0   Rimegepant Sulfate  (NURTEC) 75 MG TBDP Take 1 tablet (75 mg total) by mouth once as needed for up to 1 dose. Max 75mg  in 24 hours 30 tablet 3   Semaglutide ,0.25 or 0.5MG /DOS, 2 MG/3ML SOPN Inject 0.25 mg into the skin once a week. 3 mL 3   sucralfate  (CARAFATE ) 1 g tablet Take 1 tablet (1 g total) by mouth 4 (four) times daily -  with meals and at bedtime. 21 tablet 0    SUMAtriptan  (IMITREX ) 50 MG tablet Take 1 tablet (50 mg total) by mouth as needed for migraine. May repeat in 2 hours if headache persists or recurs. Do not take more than 2 tablets per day. 60 tablet 3   Tasimelteon  (HETLIOZ ) 20 MG CAPS Take 20 mg by mouth at bedtime. 30 capsule 3   No current facility-administered medications for this visit.    REVIEW OF SYSTEMS:   All relevant systems were reviewed with the patient and are negative.  PHYSICAL EXAMINATION: ECOG PERFORMANCE STATUS: {CHL ONC ECOG [phone]}  There were no vitals filed for this visit. There were no vitals filed for this visit.  GENERAL: alert, no distress and comfortable SKIN: skin color normal LUNGS: Effort normal and no respiratory distress.  Clear to auscultation HEART: regular rate & rhythm ABDOMEN: abdomen soft, non-tender Musculoskeletal: no cyanosis and edema.  Right lower extremity *** left lower extremity ***  LABORATORY DATA:  I have reviewed the data as listed   RADIOGRAPHIC STUDIES: I have personally reviewed the radiological images as listed and agreed with the findings in the report. No results found.

## 2024-01-13 ENCOUNTER — Other Ambulatory Visit: Payer: MEDICAID

## 2024-01-13 NOTE — Telephone Encounter (Signed)
 F/U call. Pt stated she's not feeling better. C/o cough, fever,night sweats,chills,wheezing. Unable to come today d/t transportation. Stated she can come tomorrow - appt given w/ Dr Elicia tomorrow 9/23 @ 0815 AM.

## 2024-01-14 ENCOUNTER — Ambulatory Visit: Payer: MEDICAID | Admitting: Student

## 2024-01-14 NOTE — Progress Notes (Deleted)
 CC: ***  HPI: [name] is a 43 y.o. female living with a history stated below and presents today for ***. Please see problem based assessment and plan for additional details.  Past Medical History:  Diagnosis Date   Anxiety    Arthritis    Asthma    BPPV (benign paroxysmal positional vertigo) 05/18/2022   Bronchitis    BV (bacterial vaginosis)    Candidiasis    Carpal tunnel syndrome, bilateral 01/16/2019   Chest pain of uncertain etiology 07/18/2021   Constipation 12/10/2020   Cystocele without uterine prolapse    Depression    Diabetes mellitus without complication (HCC)    Endometriosis    Exertional dyspnea 06/24/2021   Headache(784.0)    migraines   Hypertension    no longer takes meds   Migraines    Mild concentric left ventricular hypertrophy (LVH) 08/12/2020   Echo (07/2020):  1. Left ventricular ejection fraction, by estimation, is 45 to 50%. The  left ventricle has mildly decreased function. The left ventricle  demonstrates global hypokinesis. There is mild asymmetric left ventricular  hypertrophy of the basal-septal segment.  The interventricular septum is flattened in systole, consistent with right  ventricular pressure overload.   2. Right ve   Muscle spasm    Ovarian cyst    Pelvic pain in female 09/29/2015   Rectal bleeding 12/09/2020   FHx:   Father: colon cancer (53 yo)  Paternal aunt: colon cancer (~43 yo), breast cancer (unknown age).  Paternal gma: Breast cancer   Paternal 2nd cousin: Ovarian cancer     Colonoscopy (07/2020):  Perianal and digital rectal examinations: normal.  Findings:  - A patchy area of moderately nodular mucosa was found at the hepatic flexure and in the ascending colon.  - A patchy area of moderately no   Scoliosis    Thrombocytosis 08/12/2020   Uterine fibroid    Vulvovaginal candidiasis 05/15/2018    Current Outpatient Medications on File Prior to Visit  Medication Sig Dispense Refill   acetaminophen  (TYLENOL )  500 MG tablet Take 2 tablets (1,000 mg total) by mouth every 6 (six) hours as needed for mild pain (or Fever >/= 101). 30 tablet 0   albuterol  (PROVENTIL ) (2.5 MG/3ML) 0.083% nebulizer solution Take 3 mLs (2.5 mg total) by nebulization every 6 (six) hours as needed for wheezing or shortness of breath. 75 mL 0   albuterol  (VENTOLIN  HFA) 108 (90 Base) MCG/ACT inhaler Inhale 2 puffs into the lungs every 6 (six) hours as needed for wheezing or shortness of breath. Reported on 09/29/2015     amLODipine  (NORVASC ) 5 MG tablet Take 1 tablet (5 mg total) by mouth daily. 90 tablet 3   apixaban  (ELIQUIS ) 5 MG TABS tablet Take 1 tablet (5 mg total) by mouth 2 (two) times daily. 60 tablet 2   ARIPiprazole  (ABILIFY ) 20 MG tablet Take 1 tablet (20 mg total) by mouth daily. (Patient taking differently: Take 20 mg by mouth at bedtime.) 30 tablet 3   baclofen  (LIORESAL ) 20 MG tablet Take 1 tablet (20 mg total) by mouth 3 (three) times daily. 30 each 0   chlorhexidine  (PERIDEX ) 0.12 % solution Rinse with 15ml by mouth for 1 minute then spit, 2 (two) times daily for 10 days. 473 mL 0   Continuous Glucose Sensor (DEXCOM G7 SENSOR) MISC Please use to check sugar and change sensor every 10 days 3 each 3   dicyclomine  (BENTYL ) 20 MG tablet Take 1 tablet (20 mg total)  by mouth 3 (three) times daily as needed for spasms. 90 tablet 3   famotidine  (PEPCID ) 20 MG tablet Take 1 tablet (20 mg total) by mouth 2 (two) times daily. 30 tablet 0   fluconazole  (DIFLUCAN ) 150 MG tablet Take 1 tablet (150 mg total) by mouth daily. If symptoms persist take second dose of Diflucan  150 mg on day 3 to treat vaginal Candida. 2 tablet 0   furosemide  (LASIX ) 20 MG tablet Take 2 tablets (40 mg total) by mouth daily. 60 tablet 0   gabapentin  (NEURONTIN ) 300 MG capsule Take 1 capsule (300 mg total) by mouth 3 (three) times daily. (Patient taking differently: Take 300 mg by mouth 2 (two) times daily.) 90 capsule 3   ibuprofen  (ADVIL ) 800 MG tablet Take  1 tablet every 8 hours as needed for pain 20 tablet 0   insulin  aspart (NOVOLOG ) 100 UNIT/ML FlexPen Inject 12 Units into the skin 2 (two) times daily with a meal. 6 mL 3   insulin  glargine (LANTUS  SOLOSTAR) 100 UNIT/ML Solostar Pen Inject 30 Units into the skin daily. 15 mL 3   lamoTRIgine  (LAMICTAL ) 150 MG tablet Take 1 tablet (150 mg total) by mouth daily. 30 tablet 3   lidocaine  (HM LIDOCAINE  PATCH) 4 % Place 1 patch onto the skin daily as needed. 6 patch 6   lidocaine  (XYLOCAINE ) 5 % ointment Apply 1 Application topically as needed. 35.44 g 0   metFORMIN  (GLUCOPHAGE ) 1000 MG tablet Take 1 tablet (1,000 mg total) by mouth 2 (two) times daily with a meal. 90 tablet 3   methocarbamol  (ROBAXIN ) 500 MG tablet Take 1 tablet (500 mg total) by mouth 3 (three) times daily as needed (muscle spasms). 90 tablet 2   metroNIDAZOLE  (FLAGYL ) 500 MG tablet Take 1 tablet (500 mg total) by mouth 2 (two) times daily. 14 tablet 0   mirtazapine  (REMERON ) 45 MG tablet Take 1 tablet (45 mg total) by mouth at bedtime. 30 tablet 3   mometasone -formoterol  (DULERA ) 200-5 MCG/ACT AERO Inhale 2 puffs into the lungs 2 (two) times daily. 13 g 2   omeprazole  (PRILOSEC) 40 MG capsule Take 1 capsule (40 mg total) by mouth daily. 30 capsule 1   potassium chloride  (KLOR-CON ) 10 MEQ tablet Take 1 tablet (10 mEq total) by mouth daily. 5 tablet 0   Rimegepant Sulfate  (NURTEC) 75 MG TBDP Take 1 tablet (75 mg total) by mouth once as needed for up to 1 dose. Max 75mg  in 24 hours 30 tablet 3   Semaglutide ,0.25 or 0.5MG /DOS, 2 MG/3ML SOPN Inject 0.25 mg into the skin once a week. 3 mL 3   sucralfate  (CARAFATE ) 1 g tablet Take 1 tablet (1 g total) by mouth 4 (four) times daily -  with meals and at bedtime. 21 tablet 0   SUMAtriptan  (IMITREX ) 50 MG tablet Take 1 tablet (50 mg total) by mouth as needed for migraine. May repeat in 2 hours if headache persists or recurs. Do not take more than 2 tablets per day. 60 tablet 3   Tasimelteon   (HETLIOZ ) 20 MG CAPS Take 20 mg by mouth at bedtime. 30 capsule 3   No current facility-administered medications on file prior to visit.    Family History  Problem Relation Age of Onset   Migraines Mother    Cancer Father    Colon cancer Father    Lupus Paternal Grandmother    Cancer Paternal Grandmother    Breast cancer Paternal Grandmother    Migraines Maternal Aunt  Hypertension Maternal Aunt    Breast cancer Maternal Aunt    Colon cancer Paternal Aunt    Breast cancer Paternal Aunt    Ovarian cancer Cousin     Social History   Socioeconomic History   Marital status: Single    Spouse name: Not on file   Number of children: Not on file   Years of education: Not on file   Highest education level: Not on file  Occupational History   Not on file  Tobacco Use   Smoking status: Former    Current packs/day: 0.00    Types: Cigarettes    Quit date: 07/2023    Years since quitting: 0.4   Smokeless tobacco: Never   Tobacco comments:    1-2 cigars/week  Vaping Use   Vaping status: Never Used  Substance and Sexual Activity   Alcohol use: Not Currently    Comment: occasional   Drug use: Yes    Frequency: 7.0 times per week    Types: Marijuana   Sexual activity: Yes    Birth control/protection: Condom, Surgical  Other Topics Concern   Not on file  Social History Narrative   Not on file   Social Drivers of Health   Financial Resource Strain: High Risk (11/19/2023)   [facility]   Overall Financial Resource Strain (CARDIA)    How hard is it for you to pay for the very basics like food, housing, medical care, and heating?: Hard  Food Insecurity: Food Insecurity Present (11/19/2023)   Received from [facility]   Hunger Vital Sign    Within the past 12 months, you worried that your food would run out before you got the money to buy more.: Often true    Within the past 12 months, the food you bought just didn't last and you didn't have money to get  more.: Often true  Transportation Needs: Unmet Transportation Needs (11/19/2023)   Received from [facility] - Transportation    In the past 12 months, has lack of transportation kept you from medical appointments or from getting medications?: Yes    In the past 12 months, has lack of transportation kept you from meetings, work, or from getting things needed for daily living?: Yes  Physical Activity: Not on file  Stress: Not on file  Social Connections: Moderately Isolated (08/20/2022)   Social Connection and Isolation Panel    Frequency of Communication with Friends and Family: More than three times a week    Frequency of Social Gatherings with Friends and Family: More than three times a week    Attends Religious Services: 1 to 4 times per year    Active Member of [employer] or Organizations: No    Attends Banker Meetings: Never    Marital Status: Never married  Intimate Partner Violence: Not At Risk (05/02/2023)   Humiliation, Afraid, Rape, and Kick questionnaire    Fear of Current or Ex-Partner: No    Emotionally Abused: No    Physically Abused: No    Sexually Abused: No    Review of Systems: ROS negative except for what is noted on the assessment and plan.  There were no vitals filed for this visit.  Physical Exam  Physical Exam: Constitutional: well-appearing *** sitting in ***, in no acute distress HENT: normocephalic atraumatic, mucous membranes moist Eyes: conjunctiva non-erythematous Neck: supple Cardiovascular: regular rate and rhythm, no m/r/g Pulmonary/Chest: normal work of breathing on room air, lungs clear to  auscultation bilaterally Abdominal: soft, non-tender, non-distended MSK: *** Neurological: alert & oriented x 3, 5/5 strength in bilateral upper and lower extremities, normal gait Skin: warm and dry Psych: ***  Assessment & Plan:   No problem-specific Assessment & Plan notes found for this encounter. PMH: Migraine, HTN, HFrEF, history  of PE on Eliquis , T2DM, PTSD, MDD  Acute: Cough, fever, night sweats, chills, wheezing  ***  Patient {GC/GE:[id number]::discussed with,seen with} Dr. {[id number]::Tpoopjfd,Z. Hoffman,Winfrey,Narendra,Chun,Chambliss,Lau,Machen}  Ozell Nearing, D.O. [facility] Internal Medicine, PGY-3 Phone: [phone] Date 01/14/2024 Time 7:36 AM

## 2024-01-19 ENCOUNTER — Encounter: Payer: Self-pay | Admitting: Student

## 2024-01-23 ENCOUNTER — Telehealth: Payer: Self-pay | Admitting: *Deleted

## 2024-01-23 ENCOUNTER — Ambulatory Visit: Payer: MEDICAID

## 2024-01-23 NOTE — Telephone Encounter (Signed)
 Call from patient over slept this morning due to medications.  Feels like she has the Flu or Pneumonia.  Feels the same as she did whrn she had clots also in her lungs.  Lungs hurt.  No mention of fever.  Does have a cough of yellow mucous.  Patient was advised to go to the ER as soon as possible.  Patient agreed and will go as she feels bad.

## 2024-01-27 ENCOUNTER — Other Ambulatory Visit (HOSPITAL_COMMUNITY): Payer: Self-pay

## 2024-01-27 ENCOUNTER — Other Ambulatory Visit: Payer: Self-pay

## 2024-01-27 ENCOUNTER — Other Ambulatory Visit: Payer: Self-pay | Admitting: *Deleted

## 2024-01-27 DIAGNOSIS — E119 Type 2 diabetes mellitus without complications: Secondary | ICD-10-CM

## 2024-01-27 DIAGNOSIS — J45909 Unspecified asthma, uncomplicated: Secondary | ICD-10-CM

## 2024-01-27 MED ORDER — INSULIN ASPART 100 UNIT/ML FLEXPEN
12.0000 [IU] | PEN_INJECTOR | Freq: Two times a day (BID) | SUBCUTANEOUS | 3 refills | Status: DC
  Filled 2024-01-27: qty 6, 25d supply, fill #0

## 2024-01-27 MED ORDER — ALBUTEROL SULFATE HFA 108 (90 BASE) MCG/ACT IN AERS
2.0000 | INHALATION_SPRAY | Freq: Four times a day (QID) | RESPIRATORY_TRACT | Status: AC | PRN
Start: 2024-01-27 — End: ?

## 2024-01-27 NOTE — Telephone Encounter (Signed)
 I called pt to find out what type of test strips (name) so the correct ones will be filled.

## 2024-01-27 NOTE — Telephone Encounter (Signed)
 Copied from CRM [id number]. Topic: Clinical - Prescription Issue >> Jan 27, 2024  3:48 PM Graeme ORN wrote: Reason for CRM: Pateint called. States she spoke with pharmacy. They said they sent request to provider and medications have to be approved before they can be picked up. Patient is out of medication would like someone to review and contact pharmacy for:  insulin  aspart (NOVOLOG ) 100 UNIT/ML FlexPen Glucose test strips  albuterol  (VENTOLIN  HFA) 108 (90 Base) MCG/ACT inhaler.  [facility]    Thank You

## 2024-01-28 ENCOUNTER — Other Ambulatory Visit: Payer: Self-pay

## 2024-01-30 ENCOUNTER — Other Ambulatory Visit: Payer: Self-pay

## 2024-01-30 ENCOUNTER — Ambulatory Visit: Payer: MEDICAID

## 2024-01-30 DIAGNOSIS — E119 Type 2 diabetes mellitus without complications: Secondary | ICD-10-CM

## 2024-01-30 NOTE — Telephone Encounter (Signed)
 Medication sent to pharmacy

## 2024-02-05 ENCOUNTER — Ambulatory Visit: Payer: MEDICAID

## 2024-02-06 ENCOUNTER — Other Ambulatory Visit: Payer: Self-pay

## 2024-02-06 ENCOUNTER — Ambulatory Visit: Payer: MEDICAID | Admitting: Student

## 2024-02-06 DIAGNOSIS — G43009 Migraine without aura, not intractable, without status migrainosus: Secondary | ICD-10-CM

## 2024-02-06 DIAGNOSIS — B9689 Other specified bacterial agents as the cause of diseases classified elsewhere: Secondary | ICD-10-CM

## 2024-02-06 MED ORDER — SUMATRIPTAN SUCCINATE 50 MG PO TABS
50.0000 mg | ORAL_TABLET | ORAL | 0 refills | Status: AC | PRN
  Filled 2024-02-06: qty 9, 30d supply, fill #0
  Filled 2024-03-20 – 2024-05-07 (×2): qty 12, 30d supply, fill #0

## 2024-02-06 NOTE — Telephone Encounter (Signed)
 Copied from CRM #[id number]. Topic: Clinical - Medication Refill >> Feb 06, 2024  1:48 PM Shamecia H wrote: Medication: metroNIDAZOLE  (FLAGYL ) 500 MG tablet  Has the patient contacted their pharmacy? Yes (Agent: If no, request that the patient contact the pharmacy for the refill. If patient does not wish to contact the pharmacy document the reason why and proceed with request.) (Agent: If yes, when and what did the pharmacy advise?)  This is the patient's preferred pharmacy:  [facility] [place]  [place] [place], [place] - [address] [place] [address] Phone: [phone] Fax: [phone]  Is this the correct pharmacy for this prescription? Yes If no, delete pharmacy and type the correct one.   Has the prescription been filled recently? No  Is the patient out of the medication? No  Has the patient been seen for an appointment in the last year OR does the patient have an upcoming appointment? Yes  Can we respond through MyChart? Yes  Agent: Please be advised that Rx refills may take up to 3 business days. We ask that you follow-up with your pharmacy.

## 2024-02-06 NOTE — Telephone Encounter (Signed)
 Pt was called - stated she does not want a refill on Flagyl ; needs a refill on her migraine medicine (Sumtriptan). Her next appt is 10/22.

## 2024-02-07 ENCOUNTER — Ambulatory Visit: Payer: Self-pay

## 2024-02-07 NOTE — Telephone Encounter (Signed)
 FYI Only or Action Required?: FYI only for provider.  Patient was last seen in primary care on 11/13/2023 by Nooruddin, Saad, MD.  Called Nurse Triage reporting Chest Pain.  Symptoms began a week ago.  Interventions attempted: Nothing.  Symptoms are: gradually worsening.  Triage Disposition: Go to ED Now (Notify PCP)  Patient/caregiver understands and will follow disposition?: Unsure        Copied from CRM [id number]. Topic: Clinical - Red Word Triage >> Feb 07, 2024  2:08 PM Dana Hughes wrote: Red Word that prompted transfer to Nurse Triage: Patient is calling in with extreme chest pains in the breast bone area and shortness of breath. Patient is initially calling in because she would need a different referral and mammogram order because it was canceled due to the pain she is having and was told to contact her PCP Reason for Disposition  [1] Chest pain (or angina) comes and goes AND [2] is happening more often (increasing in frequency) or getting worse (increasing in severity)  (Exception: Chest pains that last only a few seconds.)  Answer Assessment - Initial Assessment Questions 1. LOCATION: Where does it hurt?       L breast 2. RADIATION: Does the pain go anywhere else? (e.g., into neck, jaw, arms, back)     Reports the other day shot up to neck 3. ONSET: When did the chest pain begin? (Minutes, hours or days)      Last Friday 4. PATTERN: Does the pain come and go, or has it been constant since it started?  Does it get worse with exertion?      Comes and goes 5. DURATION: How long does it last (e.g., seconds, minutes, hours)     2 seconds to 10 minutes 6. SEVERITY: How bad is the pain?  (e.g., Scale 1-10; mild, moderate, or severe)     7-8/10 7. CARDIAC RISK FACTORS: Do you have any history of heart problems or risk factors for heart disease? (e.g., angina, prior heart attack; diabetes, high blood pressure, high cholesterol, smoker, or strong family history of  heart disease)     Hx of HTN, diabetes 8. PULMONARY RISK FACTORS: Do you have any history of lung disease?  (e.g., blood clots in lung, asthma, emphysema, birth control pills)     Endorses being on Eliquis  9. CAUSE: What do you think is causing the chest pain?     unknown 10. OTHER SYMPTOMS: Do you have any other symptoms? (e.g., dizziness, nausea, vomiting, sweating, fever, difficulty breathing, cough)       Dry cough, migraines at night. Endorses cold sweat, dizzy spell the other day. Denies other sx. 11. PREGNANCY: Is there any chance you are pregnant? When was your last menstrual period?       N/a  Protocols used: Chest Pain-A-AH

## 2024-02-10 ENCOUNTER — Inpatient Hospital Stay: Payer: MEDICAID | Admitting: Hematology and Oncology

## 2024-02-10 ENCOUNTER — Inpatient Hospital Stay: Payer: MEDICAID

## 2024-02-10 NOTE — Telephone Encounter (Signed)
Called pt - no answer; left message to call the office . 

## 2024-02-11 NOTE — Telephone Encounter (Signed)
 Talked to pt - not feeling well. C/o coughing, sore throat, feeling hot/cold, a little bit CP. She has tried OTC Mucinex , Nyquil. Pt has an appt tomorrow 10/22 (first available) w/Dr Nooruddin @ 1445PM.

## 2024-02-12 ENCOUNTER — Ambulatory Visit: Payer: MEDICAID | Admitting: Student

## 2024-02-20 ENCOUNTER — Ambulatory Visit: Payer: Self-pay

## 2024-02-20 NOTE — Telephone Encounter (Signed)
 FYI Only or Action Required?: FYI only for provider: ED advised.  Patient was last seen in primary care on 11/13/2023 by Nooruddin, Saad, MD.  Called Nurse Triage reporting Spasms.  Symptoms began several months ago.  Interventions attempted: Nothing.  Symptoms are: gradually worsening.  Triage Disposition: Go to ED Now (or PCP Triage)  Patient/caregiver understands and will follow disposition?: Yes  Copied from CRM [id number]. Topic: Clinical - Red Word Triage >> Feb 20, 2024  3:14 PM Dana Hughes wrote: Dana Hughes that prompted transfer to Nurse Triage: Jerking in her sleep Reason for Disposition  Nursing judgment or information in reference  Answer Assessment - Initial Assessment Questions Left shoulder jerking x 10 minutes during phone call.   Admits to mid chest pain, states that it hurts when she is breathing Patient reports feeling a little short of breath Admits to head pain in the back of her head 5/10 Admits to numbness in left arm that is caused by a knot in her left shoulder Denies fever Can not identify any factor that aggravates or relieves condition  1. REASON FOR CALL: What is your main concern right now?     Muscle twitches or jerking at bedtime and during sleep, but did experience during this phone call 2. ONSET: When did the jerking start?     Seven months ago  Protocols used: No Guideline Available-A-AH

## 2024-02-20 NOTE — Telephone Encounter (Signed)
 Talked to pt who stated she's having back and shoulder spasms; also tingling in her neck. Stated she has light chest pain at night. Appt schedule with Dr Benuel tomorrow 10/31 @ 1045 AM; pt instructed to go to ER if symptoms worsen.

## 2024-02-21 ENCOUNTER — Ambulatory Visit: Payer: MEDICAID

## 2024-02-21 NOTE — Progress Notes (Deleted)
 CC: Back pain and muscle spasms and tingling in neck.  HPI:  [name] is a 43 y.o. female with pertinent past medical history of scoliosis (further medical history stated below) and presents today for back pain and muscle spasms. Please see problem based assessment and plan for additional details.  Last clinic appointment: 11/13/2023 discussing migraine, scoliosis (referral to spine surgery placed), hypertension, history of PE, type 2 diabetes  Stated that symptoms began 7 months ago but has been gradually worsening.  Describes left shoulder jerking, mid chest pain, dyspnea, head pain and back of head, numbness in left arm, denied fever.  Last Pertinent Labs Documented:     Latest Ref Rng & Units 11/02/2023    5:45 PM 07/10/2023   10:59 AM 05/02/2023    5:13 AM  BMP  Glucose 70 - 99 mg/dL 694  717  726   BUN 6 - 20 mg/dL 5  4  5    Creatinine 0.44 - 1.00 mg/dL 9.37  9.36  9.29   BUN/Creat Ratio 9 - 23  6    Sodium 135 - 145 mmol/L 134  136  134   Potassium 3.5 - 5.1 mmol/L 3.1  3.7  3.3   Chloride 98 - 111 mmol/L 103  97  103   CO2 22 - 32 mmol/L 21  24  22    Calcium  8.9 - 10.3 mg/dL 9.0  9.5  8.5        Latest Ref Rng & Units 11/02/2023    5:45 PM 07/10/2023   10:59 AM 05/02/2023    5:13 AM  CBC  WBC 4.0 - 10.5 K/uL 11.7  9.2  7.4   Hemoglobin 12.0 - 15.0 g/dL 89.9  88.1  89.5   Hematocrit 36.0 - 46.0 % 32.7  39.8  33.7   Platelets 150 - 400 K/uL 544  522  445     Lab Results  Component Value Date   HGBA1C 11.1 (A) 11/13/2023   HGBA1C 11.2 (A) 07/18/2023   HGBA1C 9.2 (H) 11/22/2022     Past Medical History:  Diagnosis Date   Anxiety    Arthritis    Asthma    BPPV (benign paroxysmal positional vertigo) 05/18/2022   Bronchitis    BV (bacterial vaginosis)    Candidiasis    Carpal tunnel syndrome, bilateral 01/16/2019   Chest pain of uncertain etiology 07/18/2021   Constipation 12/10/2020   Cystocele without uterine prolapse    Depression    Diabetes  mellitus without complication (HCC)    Endometriosis    Exertional dyspnea 06/24/2021   Headache(784.0)    migraines   Hypertension    no longer takes meds   Migraines    Mild concentric left ventricular hypertrophy (LVH) 08/12/2020   Echo (07/2020):  1. Left ventricular ejection fraction, by estimation, is 45 to 50%. The  left ventricle has mildly decreased function. The left ventricle  demonstrates global hypokinesis. There is mild asymmetric left ventricular  hypertrophy of the basal-septal segment.  The interventricular septum is flattened in systole, consistent with right  ventricular pressure overload.   2. Right ve   Muscle spasm    Ovarian cyst    Pelvic pain in female 09/29/2015   Rectal bleeding 12/09/2020   FHx:   Father: colon cancer (74 yo)  Paternal aunt: colon cancer (~68 yo), breast cancer (unknown age).  Paternal gma: Breast cancer   Paternal 2nd cousin: Ovarian cancer     Colonoscopy (07/2020):  Perianal and  digital rectal examinations: normal.  Findings:  - A patchy area of moderately nodular mucosa was found at the hepatic flexure and in the ascending colon.  - A patchy area of moderately no   Scoliosis    Thrombocytosis 08/12/2020   Uterine fibroid    Vulvovaginal candidiasis 05/15/2018    Current Outpatient Medications on File Prior to Visit  Medication Sig Dispense Refill   acetaminophen  (TYLENOL ) 500 MG tablet Take 2 tablets (1,000 mg total) by mouth every 6 (six) hours as needed for mild pain (or Fever >/= 101). 30 tablet 0   albuterol  (PROVENTIL ) (2.5 MG/3ML) 0.083% nebulizer solution Take 3 mLs (2.5 mg total) by nebulization every 6 (six) hours as needed for wheezing or shortness of breath. 75 mL 0   albuterol  (VENTOLIN  HFA) 108 (90 Base) MCG/ACT inhaler Inhale 2 puffs into the lungs every 6 (six) hours as needed for wheezing or shortness of breath. Reported on 09/29/2015     amLODipine  (NORVASC ) 5 MG tablet Take 1 tablet (5 mg total) by mouth daily. 90 tablet  3   apixaban  (ELIQUIS ) 5 MG TABS tablet Take 1 tablet (5 mg total) by mouth 2 (two) times daily. 60 tablet 2   ARIPiprazole  (ABILIFY ) 20 MG tablet Take 1 tablet (20 mg total) by mouth daily. (Patient taking differently: Take 20 mg by mouth at bedtime.) 30 tablet 3   baclofen  (LIORESAL ) 20 MG tablet Take 1 tablet (20 mg total) by mouth 3 (three) times daily. 30 each 0   chlorhexidine  (PERIDEX ) 0.12 % solution Rinse with 15ml by mouth for 1 minute then spit, 2 (two) times daily for 10 days. 473 mL 0   Continuous Glucose Sensor (DEXCOM G7 SENSOR) MISC Please use to check sugar and change sensor every 10 days 3 each 3   dicyclomine  (BENTYL ) 20 MG tablet Take 1 tablet (20 mg total) by mouth 3 (three) times daily as needed for spasms. 90 tablet 3   famotidine  (PEPCID ) 20 MG tablet Take 1 tablet (20 mg total) by mouth 2 (two) times daily. 30 tablet 0   fluconazole  (DIFLUCAN ) 150 MG tablet Take 1 tablet (150 mg total) by mouth daily. If symptoms persist take second dose of Diflucan  150 mg on day 3 to treat vaginal Candida. 2 tablet 0   furosemide  (LASIX ) 20 MG tablet Take 2 tablets (40 mg total) by mouth daily. 60 tablet 0   gabapentin  (NEURONTIN ) 300 MG capsule Take 1 capsule (300 mg total) by mouth 3 (three) times daily. (Patient taking differently: Take 300 mg by mouth 2 (two) times daily.) 90 capsule 3   ibuprofen  (ADVIL ) 800 MG tablet Take 1 tablet every 8 hours as needed for pain 20 tablet 0   Insulin  Aspart FlexPen (NOVOLOG ) 100 UNIT/ML INECT 12 UNITS SUBCUTANEOUSLY 2 (TWO) TIMES DAILY WITH A MEAL 24 mL 11   insulin  glargine (LANTUS  SOLOSTAR) 100 UNIT/ML Solostar Pen Inject 30 Units into the skin daily. 15 mL 3   lamoTRIgine  (LAMICTAL ) 150 MG tablet Take 1 tablet (150 mg total) by mouth daily. 30 tablet 3   lidocaine  (HM LIDOCAINE  PATCH) 4 % Place 1 patch onto the skin daily as needed. 6 patch 6   lidocaine  (XYLOCAINE ) 5 % ointment Apply 1 Application topically as needed. 35.44 g 0   metFORMIN   (GLUCOPHAGE ) 1000 MG tablet Take 1 tablet (1,000 mg total) by mouth 2 (two) times daily with a meal. 90 tablet 3   methocarbamol  (ROBAXIN ) 500 MG tablet Take 1 tablet (500 mg  total) by mouth 3 (three) times daily as needed (muscle spasms). 90 tablet 2   metroNIDAZOLE  (FLAGYL ) 500 MG tablet Take 1 tablet (500 mg total) by mouth 2 (two) times daily. 14 tablet 0   mirtazapine  (REMERON ) 45 MG tablet Take 1 tablet (45 mg total) by mouth at bedtime. 30 tablet 3   mometasone -formoterol  (DULERA ) 200-5 MCG/ACT AERO Inhale 2 puffs into the lungs 2 (two) times daily. 13 g 2   omeprazole  (PRILOSEC) 40 MG capsule Take 1 capsule (40 mg total) by mouth daily. 30 capsule 1   potassium chloride  (KLOR-CON ) 10 MEQ tablet Take 1 tablet (10 mEq total) by mouth daily. 5 tablet 0   Rimegepant Sulfate  (NURTEC) 75 MG TBDP Take 1 tablet (75 mg total) by mouth once as needed for up to 1 dose. Max 75mg  in 24 hours 30 tablet 3   Semaglutide ,0.25 or 0.5MG /DOS, 2 MG/3ML SOPN Inject 0.25 mg into the skin once a week. 3 mL 3   sucralfate  (CARAFATE ) 1 g tablet Take 1 tablet (1 g total) by mouth 4 (four) times daily -  with meals and at bedtime. 21 tablet 0   SUMAtriptan  (IMITREX ) 50 MG tablet Take 1 tablet (50 mg total) by mouth as needed for migraine. May repeat in 2 hours if headache persists or recurs. Do not take more than 2 tablets per day. 60 tablet 0   Tasimelteon  (HETLIOZ ) 20 MG CAPS Take 20 mg by mouth at bedtime. 30 capsule 3   No current facility-administered medications on file prior to visit.    Family History  Problem Relation Age of Onset   Migraines Mother    Cancer Father    Colon cancer Father    Lupus Paternal Grandmother    Cancer Paternal Grandmother    Breast cancer Paternal Grandmother    Migraines Maternal Aunt    Hypertension Maternal Aunt    Breast cancer Maternal Aunt    Colon cancer Paternal Aunt    Breast cancer Paternal Aunt    Ovarian cancer Cousin     Social History   Socioeconomic  History   Marital status: Single    Spouse name: Not on file   Number of children: Not on file   Years of education: Not on file   Highest education level: Not on file  Occupational History   Not on file  Tobacco Use   Smoking status: Former    Current packs/day: 0.00    Types: Cigarettes    Quit date: 07/2023    Years since quitting: 0.5   Smokeless tobacco: Never   Tobacco comments:    1-2 cigars/week  Vaping Use   Vaping status: Never Used  Substance and Sexual Activity   Alcohol use: Not Currently    Comment: occasional   Drug use: Yes    Frequency: 7.0 times per week    Types: Marijuana   Sexual activity: Yes    Birth control/protection: Condom, Surgical  Other Topics Concern   Not on file  Social History Narrative   Not on file   Social Drivers of Health   Financial Resource Strain: High Risk (11/19/2023)   Received from [facility]   Overall Financial Resource Strain (CARDIA)    How hard is it for you to pay for the very basics like food, housing, medical care, and heating?: Hard  Food Insecurity: Food Insecurity Present (11/19/2023)   [facility]   Hunger Vital Sign    Within the past 12 months,  you worried that your food would run out before you got the money to buy more.: Often true    Within the past 12 months, the food you bought just didn't last and you didn't have money to get more.: Often true  Transportation Needs: Unmet Transportation Needs (11/19/2023)   Received from [facility] - Transportation    In the past 12 months, has lack of transportation kept you from medical appointments or from getting medications?: Yes    In the past 12 months, has lack of transportation kept you from meetings, work, or from getting things needed for daily living?: Yes  Physical Activity: Not on file  Stress: Not on file  Social Connections: Moderately Isolated (08/20/2022)   Social Connection and Isolation Panel    Frequency of  Communication with Friends and Family: More than three times a week    Frequency of Social Gatherings with Friends and Family: More than three times a week    Attends Religious Services: 1 to 4 times per year    Active Member of [employer] or Organizations: No    Attends Banker Meetings: Never    Marital Status: Never married  Intimate Partner Violence: Not At Risk (05/02/2023)   Humiliation, Afraid, Rape, and Kick questionnaire    Fear of Current or Ex-Partner: No    Emotionally Abused: No    Physically Abused: No    Sexually Abused: No    Review of Systems: ROS   There were no vitals filed for this visit.  Physical Exam: Physical Exam   Assessment & Plan:   Patient seen with Dr. {[id number]:[id number]::Tpoopjfd,Z. Hoffman,Mullen,Narendra,Vincent,Guilloud,Lau,Machen,Winfrey}  Assessment & Plan    No orders of the defined types were placed in this encounter.    Sallyanne Primas, D.O. [facility], PGY-1 Date 02/21/2024 Time 9:45 AM

## 2024-02-21 NOTE — Telephone Encounter (Signed)
 Called pt - no answer; mailbox is full, unable to leave a message. Per chart, pt has not re-schedule.

## 2024-02-24 ENCOUNTER — Encounter: Payer: Self-pay | Admitting: Radiology

## 2024-02-27 ENCOUNTER — Encounter: Payer: Self-pay | Admitting: Internal Medicine

## 2024-02-27 ENCOUNTER — Encounter: Payer: Self-pay | Admitting: Obstetrics and Gynecology

## 2024-02-28 ENCOUNTER — Ambulatory Visit: Payer: MEDICAID

## 2024-02-28 ENCOUNTER — Telehealth: Payer: Self-pay | Admitting: *Deleted

## 2024-02-28 NOTE — Telephone Encounter (Signed)
 Copied from CRM #[id number]. Topic: Clinical - Request for Lab/Test Order >> Feb 28, 2024  9:16 AM Dana Hughes wrote: Reason for CRM: Patient is calling to see if the provider can make her a new appointment for an ultrasound do to the pain in her breast. Could you assist? Patients callback number is [phone].

## 2024-02-28 NOTE — Telephone Encounter (Signed)
 Pt stated she's having left breast pain; screening mammogram order needs to be changed to diagnostic/ultrasound. Also pt is requesting to change her 11/17 appt to a sooner appt since she's not feeling well, unable to come today - appt scheduled Monday 11/10.

## 2024-03-02 ENCOUNTER — Other Ambulatory Visit: Payer: Self-pay | Admitting: Student

## 2024-03-02 ENCOUNTER — Ambulatory Visit: Payer: MEDICAID | Admitting: Student

## 2024-03-02 DIAGNOSIS — N644 Mastodynia: Secondary | ICD-10-CM

## 2024-03-02 NOTE — Progress Notes (Deleted)
 CC: ***  HPI: [name] is a 43 y.o. female living with a history stated below and presents today for ***. Please see problem based assessment and plan for additional details.  Past Medical History:  Diagnosis Date   Anxiety    Arthritis    Asthma    BPPV (benign paroxysmal positional vertigo) 05/18/2022   Bronchitis    BV (bacterial vaginosis)    Candidiasis    Carpal tunnel syndrome, bilateral 01/16/2019   Chest pain of uncertain etiology 07/18/2021   Constipation 12/10/2020   Cystocele without uterine prolapse    Depression    Diabetes mellitus without complication (HCC)    Endometriosis    Exertional dyspnea 06/24/2021   Headache(784.0)    migraines   Hypertension    no longer takes meds   Migraines    Mild concentric left ventricular hypertrophy (LVH) 08/12/2020   Echo (07/2020):  1. Left ventricular ejection fraction, by estimation, is 45 to 50%. The  left ventricle has mildly decreased function. The left ventricle  demonstrates global hypokinesis. There is mild asymmetric left ventricular  hypertrophy of the basal-septal segment.  The interventricular septum is flattened in systole, consistent with right  ventricular pressure overload.   2. Right ve   Muscle spasm    Ovarian cyst    Pelvic pain in female 09/29/2015   Rectal bleeding 12/09/2020   FHx:   Father: colon cancer (19 yo)  Paternal aunt: colon cancer (~31 yo), breast cancer (unknown age).  Paternal gma: Breast cancer   Paternal 2nd cousin: Ovarian cancer     Colonoscopy (07/2020):  Perianal and digital rectal examinations: normal.  Findings:  - A patchy area of moderately nodular mucosa was found at the hepatic flexure and in the ascending colon.  - A patchy area of moderately no   Scoliosis    Thrombocytosis 08/12/2020   Uterine fibroid    Vulvovaginal candidiasis 05/15/2018    Current Outpatient Medications on File Prior to Visit  Medication Sig Dispense Refill   acetaminophen  (TYLENOL )  500 MG tablet Take 2 tablets (1,000 mg total) by mouth every 6 (six) hours as needed for mild pain (or Fever >/= 101). 30 tablet 0   albuterol  (PROVENTIL ) (2.5 MG/3ML) 0.083% nebulizer solution Take 3 mLs (2.5 mg total) by nebulization every 6 (six) hours as needed for wheezing or shortness of breath. 75 mL 0   albuterol  (VENTOLIN  HFA) 108 (90 Base) MCG/ACT inhaler Inhale 2 puffs into the lungs every 6 (six) hours as needed for wheezing or shortness of breath. Reported on 09/29/2015     amLODipine  (NORVASC ) 5 MG tablet Take 1 tablet (5 mg total) by mouth daily. 90 tablet 3   apixaban  (ELIQUIS ) 5 MG TABS tablet Take 1 tablet (5 mg total) by mouth 2 (two) times daily. 60 tablet 2   ARIPiprazole  (ABILIFY ) 20 MG tablet Take 1 tablet (20 mg total) by mouth daily. (Patient taking differently: Take 20 mg by mouth at bedtime.) 30 tablet 3   baclofen  (LIORESAL ) 20 MG tablet Take 1 tablet (20 mg total) by mouth 3 (three) times daily. 30 each 0   chlorhexidine  (PERIDEX ) 0.12 % solution Rinse with 15ml by mouth for 1 minute then spit, 2 (two) times daily for 10 days. 473 mL 0   Continuous Glucose Sensor (DEXCOM G7 SENSOR) MISC Please use to check sugar and change sensor every 10 days 3 each 3   dicyclomine  (BENTYL ) 20 MG tablet Take 1 tablet (20 mg total)  by mouth 3 (three) times daily as needed for spasms. 90 tablet 3   famotidine  (PEPCID ) 20 MG tablet Take 1 tablet (20 mg total) by mouth 2 (two) times daily. 30 tablet 0   fluconazole  (DIFLUCAN ) 150 MG tablet Take 1 tablet (150 mg total) by mouth daily. If symptoms persist take second dose of Diflucan  150 mg on day 3 to treat vaginal Candida. 2 tablet 0   furosemide  (LASIX ) 20 MG tablet Take 2 tablets (40 mg total) by mouth daily. 60 tablet 0   gabapentin  (NEURONTIN ) 300 MG capsule Take 1 capsule (300 mg total) by mouth 3 (three) times daily. (Patient taking differently: Take 300 mg by mouth 2 (two) times daily.) 90 capsule 3   ibuprofen  (ADVIL ) 800 MG tablet Take  1 tablet every 8 hours as needed for pain 20 tablet 0   Insulin  Aspart FlexPen (NOVOLOG ) 100 UNIT/ML INECT 12 UNITS SUBCUTANEOUSLY 2 (TWO) TIMES DAILY WITH A MEAL 24 mL 11   insulin  glargine (LANTUS  SOLOSTAR) 100 UNIT/ML Solostar Pen Inject 30 Units into the skin daily. 15 mL 3   lamoTRIgine  (LAMICTAL ) 150 MG tablet Take 1 tablet (150 mg total) by mouth daily. 30 tablet 3   lidocaine  (HM LIDOCAINE  PATCH) 4 % Place 1 patch onto the skin daily as needed. 6 patch 6   lidocaine  (XYLOCAINE ) 5 % ointment Apply 1 Application topically as needed. 35.44 g 0   metFORMIN  (GLUCOPHAGE ) 1000 MG tablet Take 1 tablet (1,000 mg total) by mouth 2 (two) times daily with a meal. 90 tablet 3   methocarbamol  (ROBAXIN ) 500 MG tablet Take 1 tablet (500 mg total) by mouth 3 (three) times daily as needed (muscle spasms). 90 tablet 2   metroNIDAZOLE  (FLAGYL ) 500 MG tablet Take 1 tablet (500 mg total) by mouth 2 (two) times daily. 14 tablet 0   mirtazapine  (REMERON ) 45 MG tablet Take 1 tablet (45 mg total) by mouth at bedtime. 30 tablet 3   mometasone -formoterol  (DULERA ) 200-5 MCG/ACT AERO Inhale 2 puffs into the lungs 2 (two) times daily. 13 g 2   omeprazole  (PRILOSEC) 40 MG capsule Take 1 capsule (40 mg total) by mouth daily. 30 capsule 1   potassium chloride  (KLOR-CON ) 10 MEQ tablet Take 1 tablet (10 mEq total) by mouth daily. 5 tablet 0   Rimegepant Sulfate  (NURTEC) 75 MG TBDP Take 1 tablet (75 mg total) by mouth once as needed for up to 1 dose. Max 75mg  in 24 hours 30 tablet 3   Semaglutide ,0.25 or 0.5MG /DOS, 2 MG/3ML SOPN Inject 0.25 mg into the skin once a week. 3 mL 3   sucralfate  (CARAFATE ) 1 g tablet Take 1 tablet (1 g total) by mouth 4 (four) times daily -  with meals and at bedtime. 21 tablet 0   SUMAtriptan  (IMITREX ) 50 MG tablet Take 1 tablet (50 mg total) by mouth as needed for migraine. May repeat in 2 hours if headache persists or recurs. Do not take more than 2 tablets per day. 60 tablet 0   Tasimelteon   (HETLIOZ ) 20 MG CAPS Take 20 mg by mouth at bedtime. 30 capsule 3   No current facility-administered medications on file prior to visit.    Family History  Problem Relation Age of Onset   Migraines Mother    Cancer Father    Colon cancer Father    Lupus Paternal Grandmother    Cancer Paternal Grandmother    Breast cancer Paternal Grandmother    Migraines Maternal Aunt    Hypertension  Maternal Aunt    Breast cancer Maternal Aunt    Colon cancer Paternal Aunt    Breast cancer Paternal Aunt    Ovarian cancer Cousin     Social History   Socioeconomic History   Marital status: Single    Spouse name: Not on file   Number of children: Not on file   Years of education: Not on file   Highest education level: Not on file  Occupational History   Not on file  Tobacco Use   Smoking status: Former    Current packs/day: 0.00    Types: Cigarettes    Quit date: 07/2023    Years since quitting: 0.6   Smokeless tobacco: Never   Tobacco comments:    1-2 cigars/week  Vaping Use   Vaping status: Never Used  Substance and Sexual Activity   Alcohol use: Not Currently    Comment: occasional   Drug use: Yes    Frequency: 7.0 times per week    Types: Marijuana   Sexual activity: Yes    Birth control/protection: Condom, Surgical  Other Topics Concern   Not on file  Social History Narrative   Not on file   Social Drivers of Health   Financial Resource Strain: High Risk (11/19/2023)   Received from [facility]   Overall Financial Resource Strain (CARDIA)    How hard is it for you to pay for the very basics like food, housing, medical care, and heating?: Hard  Food Insecurity: Food Insecurity Present (11/19/2023)   Received from [facility]   Hunger Vital Sign    Within the past 12 months, you worried that your food would run out before you got the money to buy more.: Often true    Within the past 12 months, the food you bought just didn't last and you didn't have money to get  more.: Often true  Transportation Needs: Unmet Transportation Needs (11/19/2023)   Received from [facility] - Transportation    In the past 12 months, has lack of transportation kept you from medical appointments or from getting medications?: Yes    In the past 12 months, has lack of transportation kept you from meetings, work, or from getting things needed for daily living?: Yes  Physical Activity: Not on file  Stress: Not on file  Social Connections: Moderately Isolated (08/20/2022)   Social Connection and Isolation Panel    Frequency of Communication with Friends and Family: More than three times a week    Frequency of Social Gatherings with Friends and Family: More than three times a week    Attends Religious Services: 1 to 4 times per year    Active Member of [employer] or Organizations: No    Attends Banker Meetings: Never    Marital Status: Never married  Intimate Partner Violence: Not At Risk (05/02/2023)   Humiliation, Afraid, Rape, and Kick questionnaire    Fear of Current or Ex-Partner: No    Emotionally Abused: No    Physically Abused: No    Sexually Abused: No    Review of Systems: ROS negative except for what is noted on the assessment and plan.  There were no vitals filed for this visit.  Physical Exam  Physical Exam: Constitutional: well-appearing *** sitting in ***, in no acute distress HENT: normocephalic atraumatic, mucous membranes moist Eyes: conjunctiva non-erythematous Cardiovascular: regular rate and rhythm, no m/r/g Pulmonary/Chest: normal work of breathing on room air, lungs clear to auscultation bilaterally Abdominal:  soft, non-tender, non-distended MSK: *** Neurological: alert & oriented x 3, 5/5 strength in bilateral upper and lower extremities, normal gait Skin: warm and dry Psych: ***  Assessment & Plan:   Assessment & Plan     No orders of the defined types were placed in this encounter.  PMH: Migraine, HTN, HFrEF,  PE on Eliquis , T2DM  Status: {statusupdate:33856}.Last A1c 11.11 in 10/2023.  A1c today is***.  Currently taking Lantus  30 units, NovoLog  12 units twice daily, started Ozempic ***.  Monitor: ***. Patient *** hypoglycemia.  Per review of ***, average blood glucose is***.  Average fasting blood glucose is***.  Plan -Continue *** -A1c *** -Ophthalmology exam: *** -Urine ACR: 10/2023 -LDL 76 on 05/2022, not on statin therapy, repeat lipid panel today*** The 10-year ASCVD risk score (Arnett DK, et al., 2019) is: 5.8%   Migraines Prescribed Nurtec MRI? NSAID?   No follow-ups on file.  Patient {GC/GE:[id number]::discussed with,seen with} Dr. {[id number]::Tpoopjfd,Z. Hoffman,Winfrey,Narendra,Chun,Chambliss,Lau,Machen}  Dana Hughes, D.O. [facility], PGY-3 Clinic Phone: [phone] Date 03/02/2024 Time 7:42 AM

## 2024-03-09 ENCOUNTER — Ambulatory Visit: Payer: Self-pay

## 2024-03-18 ENCOUNTER — Encounter: Payer: Self-pay | Admitting: Obstetrics and Gynecology

## 2024-03-20 ENCOUNTER — Other Ambulatory Visit (HOSPITAL_COMMUNITY): Payer: Self-pay | Admitting: Psychiatry

## 2024-03-20 ENCOUNTER — Other Ambulatory Visit: Payer: Self-pay

## 2024-03-20 DIAGNOSIS — F3181 Bipolar II disorder: Secondary | ICD-10-CM

## 2024-03-26 ENCOUNTER — Ambulatory Visit: Payer: Self-pay

## 2024-03-26 ENCOUNTER — Encounter: Payer: Self-pay | Admitting: Internal Medicine

## 2024-03-26 ENCOUNTER — Ambulatory Visit: Payer: MEDICAID | Admitting: Internal Medicine

## 2024-03-26 VITALS — BP 138/86 | HR 93 | Temp 98.6°F | Ht 63.5 in | Wt 199.2 lb

## 2024-03-26 DIAGNOSIS — J209 Acute bronchitis, unspecified: Secondary | ICD-10-CM

## 2024-03-26 DIAGNOSIS — Z86711 Personal history of pulmonary embolism: Secondary | ICD-10-CM | POA: Diagnosis not present

## 2024-03-26 DIAGNOSIS — I2699 Other pulmonary embolism without acute cor pulmonale: Secondary | ICD-10-CM

## 2024-03-26 DIAGNOSIS — Z7901 Long term (current) use of anticoagulants: Secondary | ICD-10-CM

## 2024-03-26 LAB — COMPREHENSIVE METABOLIC PANEL WITH GFR
ALT: 21 U/L (ref 0–35)
AST: 18 U/L (ref 0–37)
Albumin: 4 g/dL (ref 3.5–5.2)
Alkaline Phosphatase: 58 U/L (ref 39–117)
BUN: 8 mg/dL (ref 6–23)
CO2: 25 meq/L (ref 19–32)
Calcium: 9.4 mg/dL (ref 8.4–10.5)
Chloride: 105 meq/L (ref 96–112)
Creatinine, Ser: 0.65 mg/dL (ref 0.40–1.20)
GFR: 107.9 mL/min (ref >60.00)
Glucose, Bld: 153 mg/dL — ABNORMAL HIGH (ref 70–99)
Potassium: 3.7 meq/L (ref 3.5–5.1)
Sodium: 138 meq/L (ref 135–145)
Total Bilirubin: 0.2 mg/dL (ref 0.2–1.2)
Total Protein: 7.6 g/dL (ref 6.0–8.3)

## 2024-03-26 LAB — CBC WITH DIFFERENTIAL/PLATELET
Basophils Absolute: 0 K/uL (ref 0.0–0.1)
Basophils Relative: 0.5 % (ref 0.0–3.0)
Eosinophils Absolute: 0.2 K/uL (ref 0.0–0.7)
Eosinophils Relative: 1.6 % (ref 0.0–5.0)
HCT: 26.3 % — ABNORMAL LOW (ref 36.0–46.0)
Hemoglobin: 7.4 g/dL — CL (ref 12.0–15.0)
Lymphocytes Relative: 36.8 % (ref 12.0–46.0)
Lymphs Abs: 3.5 K/uL (ref 0.7–4.0)
MCHC: 28.1 g/dL — ABNORMAL LOW (ref 30.0–36.0)
MCV: 64.9 fl — ABNORMAL LOW (ref 78.0–100.0)
Monocytes Absolute: 0.6 K/uL (ref 0.1–1.0)
Monocytes Relative: 6.1 % (ref 3.0–12.0)
Neutro Abs: 5.2 K/uL (ref 1.4–7.7)
Neutrophils Relative %: 55 % (ref 43.0–77.0)
Platelets: 505 K/uL — ABNORMAL HIGH (ref 150.0–400.0)
RBC: 4.05 Mil/uL (ref 3.87–5.11)
RDW: 19.1 % — ABNORMAL HIGH (ref 11.5–15.5)
WBC: 9.5 K/uL (ref 4.0–10.5)

## 2024-03-26 NOTE — Telephone Encounter (Signed)
 FYI Only or Action Required?: Action required by provider: critical lab.  Patient was last seen in primary care on 11/13/2023 by Nooruddin, Saad, MD.  Called Nurse Triage reporting Advice Only.  Symptoms began n/a.  Interventions attempted: Other: n/a.  Symptoms are: n/a.  Triage Disposition: Call PCP Now  Patient/caregiver understands and will follow disposition?: YesDr. Neysa in [place]  Critical lab: hgb 7.4  Dana Hughes- [facility]    Copied from CRM [id number]. Topic: Clinical - Lab/Test Results >> Mar 26, 2024  4:13 PM Dana Hughes wrote: Reason for CRM: Dana Hughes with [facility] lab with a critical lab result. No answer Reason for Disposition  Lab or radiology calling with CRITICAL test results  Answer Assessment - Initial Assessment Questions 1. REASON FOR CALL or QUESTION: What is your reason for calling today? or How can I best     Critical lab value. HGB 7.4.  PAS stated attempted to call CAL. RN attempted to call CAL x2.  2. CALLER: Document the source of call. (e.g., laboratory staff, caregiver or patient).     Dana Hughes lab.  Protocols used: PCP Call - No Triage-A-AH

## 2024-03-26 NOTE — Patient Instructions (Signed)
 Order- labs- CBC w diff, D-dimer, CMET       dx recurrent pulmonary emboli  Order- referral to Hematology-   dx recurrent pulmonary emboli  Be sure to continue your Eliquis 

## 2024-03-26 NOTE — Progress Notes (Unsigned)
 HPI F former smoker with question of sleep disorder Medical problem list includes Migraine, HTN, PulmonaryEmbolism/ 4/24/  Repeat PE 04/2023/ Eliquis , Asthma, DM2, Anxiety/ Depression, Endometriosis, FEAnemia,  NPSG- 01/06/23- WNL- AHI 1.5/hr, desat to 88 ========================================================================================================  11/22/22- 41 yoF former smoker for sleep evaluation    Patient pregnant Medical problem list includes Migraine, HTN, hx PulmonaryEmbolism/ Lovenox4/24/ , Asthma, DM2, Anxiety/ Depression, Endometriosis, FEAnemia,  Epworth score-22 Body weight-215 lbs Aware of snoring. Frequent sleep walking x 5 years. No injuries. Usually gets up, goes to kitchen for water, then back to bed. No ENT surgery. Several family with OSA. Brother on CPAP. Anxiety meds help sleep. Occ AM coffee. Bedtime 1:30-2:30 AM. Up 5-6 times to bathroom, sometimes unaware. Occ palpitation. Ovcc sharp pains in anterior chest wall. Has just learned that she is pregnant again. CTaChest PE 08/12/22->> IR Thrombolytic  IMPRESSION: 1. Marked severity bilateral pulmonary embolism with right heart strain (RV/LV Ratio = 1.31) which has been associated with an increased risk of morbidity and mortality. 2. Mild patchy right upper lobe and left lower lobe infiltrates. 3. Evidence of prior cholecystectomy  08/29/23- 42 yoF former smoker with question of sleep disorder Medical problem list includes Migraine, HTN, PulmonaryEmbolism/ 4/24/  Repeat PE 04/2023/ Eliquis , Asthma, DM2, Anxiety/ Depression, Endometriosis, FEAnemia, -albuterol  hfa,Dulera  200  NPSG- 01/06/23- WNL- AHI 1.5/hr, desat to 88 Body weight today-201 lbs Discussed the use of AI scribe software for clinical note transcription with the patient, who gave verbal consent to proceed.  History of Present Illness   Dana Hughes is a 43 year old female with recurrent pulmonary emboli who presents with numbness in her leg and  breathing difficulties during sleep.  She experiences breathing difficulties during sleep, characterized by apnea and gasping. Her boyfriend observes these episodes and pokes her, noting that she does not snore much unless very tired. A sleep study conducted last fall was normal, showing no sleep apnea, yet she continues to experience nocturnal breathing difficulties. She does not take any medication to aid sleep.  She has asthma and uses albuterol  and Dulera  inhalers. Her current breathing difficulties differ from  asthma symptoms. She has experienced chest tightness, dyspnea, and lightheadedness at night, . These episodes require her to take measures such as cold showers and using ice to alleviate symptoms. She describes an episode of R leg numbness lasting for a day.   She has had at least 2 separate pulmonary emboli and is currently on Eliquis . I recommended referral for coagulopathy eval by Hematology.     Assessment and Plan:    Asthma- mild intermittent uncomplicated Asthma with current symptoms not indicative of exacerbation. Albuterol  and Dulera  inhalers available.  Recurrent pulmonary embolism Recurrent pulmonary embolism, most recently in January. Currently on Eliquis . Recent transient leg numbness raises concern for vascular or neurological event. Referral to hematology necessary for evaluation of potential clotting disorders. - Refer to hematology for evaluation of recurrent blood clots and potential underlying clotting disorder.  Sleep Disordered Breathing Nocturnal symptoms suggestive of sleep apnea, including gasping for air and observed apneas. Previous sleep study normal, but symptoms persist. - Order home sleep test to evaluate for sleep apnea.    Obesity - encourage diet/ exercise. -Discuss with PCP  CXR 07/18/23 IMPRESSION: No active cardiopulmonary disease. CTa PE 05/01/23 IMPRESSION: 1. Pulmonary emboli are seen throughout segmental and subsegmental branches of the  right lower lobe. Web-like pulmonary embolism in the distal left lower lobe pulmonary artery. No evidence for right heart strain. 2. Minimal  patchy airspace and ground-glass opacities in the right lower lobe worrisome for infarct. 3. Air trapping in the bilateral lower lungs worrisome for small airways disease.  03/26/24- 87 yoF former smoker with question of sleep disorder, complicated by  Migraine, HTN, PulmonaryEmbolism/ 4/24/  Repeat PE 04/2023/ Eliquis , Asthma, DM2, Anxiety/ Depression/ BiPOLAR, Endometriosis, FEAnemia, -albuterol  hfa,Dulera  200  NPSG- 01/06/23- WNL- AHI 1.5/hr, desat to 88 HST 09/13/23- HI 2.6/hr, desat to 83%, body weight 198 lbs Body weight today-199 lbs ------Sharp pains in front rib area when walking up hills.  Also has cough.  Patient concerned about another blood clot in lungs. Discussed the use of AI scribe software for clinical note transcription with the patient, who gave verbal consent to proceed.  History of Present Illness   Dana Hughes is a 43 year old female with a history of pulmonary embolism who presents with persistent cough and rib pain. She has been evaluated for rectal bleeding on Eliquis .  She has had a persistent cough for 1 month. Over-the-counter medications helped initially but the cough recurred with clear mucus. She has cold symptoms with sore throat and hoarseness, and no reported fever.  She has rib tightness under her breasts and along bilateral ribs that she associates with coughing. The discomfort feels deep, not in breasts, is worse when lying on her side or stomach, and limits her to lying on her back.  She had blood clots in her lungs earlier this year diagnosed by CT and is on Eliquis . She reports increased shortness of breath and more difficulty walking even with a walker. She missed an appointment with Hematology to assess for coagulopathy, and agrees to new referral.  She recently had a severe fatigue episode when she could not  get out of bed and worried about pneumonia but did not seek care. She has had unintentional weight loss. She describes cough productive of only clear sputum and had some sore throat and maybe transient low-grade fever last month.    She denies pregnancy.     Assessment and Plan:    Subacute bronchitis Cough with rib pain and shortness of breath/ musculoskeletal chest pain Persistent cough with clear mucus, rib pain from muscle strain, and shortness of breath most likely due to viral bronchitis. Difficult historian with considerable anxiety, easily suggestible and broadly positive - Encouraged gradual increase in walking to build endurance.  Recent viral bronchitis Likely viral etiology with cough, sore throat, and hoarseness. Symptoms improving without fever or significant sputum.  History of pulmonary embolism on anticoagulation therapy Pulmonary embolism managed with Eliquis . Emphasized importance of continued anticoagulation to prevent clots. - Continue Eliquis  as prescribed. - Ordered D-dimer test to assess for new blood clots. - Ordered blood count and chemistry panel. -Referral to Hematology for coagulopathy assessment.  Musculoskeletal chest wall pain Pain likely from muscle strain due to coughing, no significant findings on examination. - Advised on comfort measures, including heat, rib binder.  Blurry vision Blurry vision reported with weight loss and hair shedding, requires further evaluation. - Discuss blurry vision with primary care physician.      ADDENDUM (03/27/24) -LABS FROM 03/26/24- Hg 7.4. D-dimer normal. Glucose elevated (diabetic)  She is being directed to ER due to what looks like blood loss anemia on Eliquis . No evidence now for new clot, but with hx recurrent PE she may need to consider IVC umbrella.    ROS-see HPI   + = positive Constitutional:    weight loss, night sweats, fevers, chills, fatigue, lassitude.  HEENT:    headaches, difficulty swallowing,  tooth/dental problems, sore throat,       sneezing, itching, ear ache, nasal congestion, post nasal drip, snoring CV:    chest pain, orthopnea, PND, swelling in lower extremities, anasarca,                                   dizziness, palpitations Resp:   shortness of breath with exertion or at rest.                productive cough,   non-productive cough, coughing up of blood.              change in color of mucus.  wheezing.   Skin:    rash or lesions. GI:  No-   heartburn, indigestion, abdominal pain, nausea, vomiting, diarrhea,                 change in bowel habits, loss of appetite GU: dysuria, change in color of urine, no urgency or frequency.   flank pain. MS:   joint pain, stiffness, decreased range of motion, back pain. Neuro-     nothing unusual Psych:  change in mood or affect.  +depression or +anxiety.   memory loss.  OBJ- Physical Exam General- Alert, Oriented, Affect-appropriate, Distress- none acute, +obese Skin- rash-none, lesions- none, excoriation- none, +tatoos, +stretch marks Lymphadenopathy- none Head- atraumatic            Eyes- Gross vision intact, PERRLA, conjunctivae and secretions clear            Ears- Hearing, canals-normal            Nose- +snorting, no-Septal dev, mucus, polyps, erosion, perforation             Throat- Mallampati III , +tongue stud , drainage- none, tonsils- atrophic, +teeth Neck- flexible , trachea midline, no stridor , thyroid  nl, carotid no bruit Chest - symmetrical excursion , unlabored           Heart/CV- RRR , no murmur , no gallop  , no rub, nl s1 s2                           - JVD- none , edema- none, stasis changes- none, varices- none           Lung- clear to P&A, wheeze- none, cough- none , dullness-none, rub- none           Chest wall-  Abd-  Br/ Gen/ Rectal- Not done, not indicated Extrem- cyanosis- none, clubbing, none, atrophy- none, strength- nl Neuro- grossly intact to observation  .

## 2024-03-27 ENCOUNTER — Ambulatory Visit (INDEPENDENT_AMBULATORY_CARE_PROVIDER_SITE_OTHER): Payer: Self-pay | Admitting: Internal Medicine

## 2024-03-27 LAB — D-DIMER, QUANTITATIVE: D-Dimer, Quant: 0.43 ug{FEU}/mL (ref <0.50)

## 2024-03-27 NOTE — Telephone Encounter (Signed)
 She is severely anemic, with hemoglobin 7.4- about half of normal.  She is on Eliquis  for blood clots, so she is at real risk for sudden additional blood loss that could put her into shock. She must go to ER now for evaluation and possible transfusion. We had ordered a Hematology referral for evaluation of her blood clotting status,  but that won't happen soon enough to help this.

## 2024-03-27 NOTE — Progress Notes (Signed)
 Patient aware, verbalized understanding. Nothing farther needed

## 2024-03-27 NOTE — Telephone Encounter (Signed)
 Pt has already been notified and verbalized understanding - see result note from 03/27/24. NFN.

## 2024-03-30 ENCOUNTER — Other Ambulatory Visit: Payer: Self-pay | Admitting: Student

## 2024-03-30 ENCOUNTER — Other Ambulatory Visit: Payer: Self-pay

## 2024-03-30 ENCOUNTER — Ambulatory Visit: Payer: MEDICAID

## 2024-03-30 ENCOUNTER — Telehealth: Payer: Self-pay | Admitting: *Deleted

## 2024-03-30 DIAGNOSIS — N6452 Nipple discharge: Secondary | ICD-10-CM

## 2024-03-30 DIAGNOSIS — N644 Mastodynia: Secondary | ICD-10-CM

## 2024-03-30 NOTE — Telephone Encounter (Signed)
 Called pt - no answer; left message of office's return call and if she went to the ER.

## 2024-03-30 NOTE — Telephone Encounter (Signed)
 Copied from CRM #[id number]. Topic: Clinical - Medical Advice >> Mar 30, 2024 10:34 AM Dana Hughes wrote: Reason for CRM: Patient called in and stated that she overslept and missed appt, however, she is on the way to the ER and have a blood transfusion. If provider would like to reschedule her appt, she states to give her a call at [phone]

## 2024-03-31 ENCOUNTER — Other Ambulatory Visit: Payer: Self-pay

## 2024-04-01 ENCOUNTER — Emergency Department (HOSPITAL_COMMUNITY): Payer: MEDICAID

## 2024-04-01 ENCOUNTER — Other Ambulatory Visit: Payer: Self-pay

## 2024-04-01 ENCOUNTER — Emergency Department (HOSPITAL_COMMUNITY): Admission: EM | Admit: 2024-04-01 | Discharge: 2024-04-02 | Discharge status: Against Medical Advice | Payer: MEDICAID

## 2024-04-01 ENCOUNTER — Encounter: Payer: Self-pay | Admitting: Internal Medicine

## 2024-04-01 ENCOUNTER — Encounter (HOSPITAL_COMMUNITY): Payer: Self-pay

## 2024-04-01 ENCOUNTER — Ambulatory Visit: Payer: MEDICAID | Admitting: Obstetrics and Gynecology

## 2024-04-01 DIAGNOSIS — R531 Weakness: Secondary | ICD-10-CM | POA: Diagnosis not present

## 2024-04-01 DIAGNOSIS — R0602 Shortness of breath: Secondary | ICD-10-CM | POA: Diagnosis not present

## 2024-04-01 DIAGNOSIS — R079 Chest pain, unspecified: Secondary | ICD-10-CM | POA: Diagnosis present

## 2024-04-01 DIAGNOSIS — Z5321 Procedure and treatment not carried out due to patient leaving prior to being seen by health care provider: Secondary | ICD-10-CM | POA: Insufficient documentation

## 2024-04-01 LAB — CBC WITH DIFFERENTIAL/PLATELET
Abs Immature Granulocytes: 0.04 K/uL (ref 0.00–0.07)
Basophils Absolute: 0.1 K/uL (ref 0.0–0.1)
Basophils Relative: 1 %
Eosinophils Absolute: 0.3 K/uL (ref 0.0–0.5)
Eosinophils Relative: 2 %
HCT: 31.7 % — ABNORMAL LOW (ref 36.0–46.0)
Hemoglobin: 8.4 g/dL — ABNORMAL LOW (ref 12.0–15.0)
Immature Granulocytes: 0 %
Lymphocytes Relative: 24 %
Lymphs Abs: 2.6 K/uL (ref 0.7–4.0)
MCH: 18.6 pg — ABNORMAL LOW (ref 26.0–34.0)
MCHC: 26.5 g/dL — ABNORMAL LOW (ref 30.0–36.0)
MCV: 70.1 fL — ABNORMAL LOW (ref 80.0–100.0)
Monocytes Absolute: 0.6 K/uL (ref 0.1–1.0)
Monocytes Relative: 5 %
Neutro Abs: 7.6 K/uL (ref 1.7–7.7)
Neutrophils Relative %: 68 %
Platelets: 466 K/uL — ABNORMAL HIGH (ref 150–400)
RBC: 4.52 MIL/uL (ref 3.87–5.11)
RDW: 18.6 % — ABNORMAL HIGH (ref 11.5–15.5)
WBC: 11.1 K/uL — ABNORMAL HIGH (ref 4.0–10.5)
nRBC: 0 % (ref 0.0–0.2)

## 2024-04-01 LAB — HCG, SERUM, QUALITATIVE: Preg, Serum: NEGATIVE

## 2024-04-01 LAB — BASIC METABOLIC PANEL WITH GFR
Anion gap: 7 (ref 5–15)
BUN: 8 mg/dL (ref 6–20)
CO2: 22 mmol/L (ref 22–32)
Calcium: 8.7 mg/dL — ABNORMAL LOW (ref 8.9–10.3)
Chloride: 106 mmol/L (ref 98–111)
Creatinine, Ser: 0.67 mg/dL (ref 0.44–1.00)
GFR, Estimated: >60 mL/min (ref >60)
Glucose, Bld: 252 mg/dL — ABNORMAL HIGH (ref 70–99)
Potassium: 3.5 mmol/L (ref 3.5–5.1)
Sodium: 135 mmol/L (ref 135–145)

## 2024-04-01 LAB — TYPE AND SCREEN
ABO/RH(D): A POS
Antibody Screen: NEGATIVE

## 2024-04-01 LAB — TROPONIN I (HIGH SENSITIVITY): Troponin I (High Sensitivity): 3 ng/L (ref <18)

## 2024-04-01 NOTE — ED Provider Triage Note (Signed)
 Emergency Medicine Provider Triage Evaluation Note  Dana Hughes , a 43 y.o. female  was evaluated in triage.  Pt complains of chest pain, shortness of breath, fatigue since last week.  Complains of associated rectal bleeding.  Bright red.  Was told that she has a low hemoglobin and was referred here for transfusion.  No history of GI bleed or transfusion.  Is on Eliquis  for history of PE.  Review of Systems  Positive:  Negative:   Physical Exam  BP 121/77 (BP Location: Right Arm)   Pulse 87   Temp 98.5 F (36.9 C) (Oral)   Resp (!) 22   Ht 5' 3 (1.6 m)   Wt 88 kg   SpO2 98%   BMI 34.37 kg/m  Gen:   Awake, no distress   Resp:  Normal effort  MSK:   Moves extremities without difficulty  Other:    Medical Decision Making  Medically screening exam initiated at 2:09 PM.  Appropriate orders placed.  Dana Hughes was informed that the remainder of the evaluation will be completed by another provider, this initial triage assessment does not replace that evaluation, and the importance of remaining in the ED until their evaluation is complete.     Donnajean Lynwood DEL, PA-C 04/01/24 1409

## 2024-04-01 NOTE — ED Triage Notes (Signed)
 Pt was told to come in for a blood transfusion due to low hgb.  Pt last Hgb was 7.4 on 12/4.  She reports generalized weakness and states that she has been having vaginal and rectal bleeding.

## 2024-04-02 NOTE — Telephone Encounter (Signed)
 Copied from CRM #[id number]. Topic: Clinical - Medical Advice >> Apr 01, 2024  3:36 PM Benton O wrote: Reason for CRM: patient is wanting to tell the dr that she is at the hospital for blood fusion . Patient not feeling well she is waitng currently to go to the back  [phone]

## 2024-04-02 NOTE — Telephone Encounter (Signed)
 Dr. Neysa, Please see patient message regarding wait time in ER to get room for blood transfusion and advise.  Thank you.

## 2024-04-03 NOTE — Telephone Encounter (Signed)
 I am sorry for wait. Goal was not just to give a transfusion, but to find and fix the blood loss.

## 2024-04-06 ENCOUNTER — Other Ambulatory Visit: Payer: Self-pay

## 2024-04-07 ENCOUNTER — Ambulatory Visit: Payer: MEDICAID | Admitting: Sports Medicine

## 2024-04-10 ENCOUNTER — Emergency Department (HOSPITAL_COMMUNITY): Payer: MEDICAID

## 2024-04-10 ENCOUNTER — Emergency Department (HOSPITAL_COMMUNITY)
Admission: EM | Admit: 2024-04-10 | Discharge: 2024-04-11 | Disposition: A | Discharge status: Home / Self Care | Payer: MEDICAID | Attending: Emergency Medicine | Admitting: Emergency Medicine

## 2024-04-10 DIAGNOSIS — E119 Type 2 diabetes mellitus without complications: Secondary | ICD-10-CM | POA: Insufficient documentation

## 2024-04-10 DIAGNOSIS — R059 Cough, unspecified: Secondary | ICD-10-CM | POA: Diagnosis not present

## 2024-04-10 DIAGNOSIS — R072 Precordial pain: Secondary | ICD-10-CM

## 2024-04-10 DIAGNOSIS — E876 Hypokalemia: Secondary | ICD-10-CM | POA: Insufficient documentation

## 2024-04-10 DIAGNOSIS — D509 Iron deficiency anemia, unspecified: Secondary | ICD-10-CM | POA: Insufficient documentation

## 2024-04-10 DIAGNOSIS — Z794 Long term (current) use of insulin: Secondary | ICD-10-CM | POA: Insufficient documentation

## 2024-04-10 DIAGNOSIS — I10 Essential (primary) hypertension: Secondary | ICD-10-CM | POA: Diagnosis not present

## 2024-04-10 DIAGNOSIS — J45909 Unspecified asthma, uncomplicated: Secondary | ICD-10-CM | POA: Insufficient documentation

## 2024-04-10 DIAGNOSIS — R0602 Shortness of breath: Secondary | ICD-10-CM | POA: Diagnosis present

## 2024-04-10 LAB — PRO BRAIN NATRIURETIC PEPTIDE: Pro Brain Natriuretic Peptide: 126 pg/mL

## 2024-04-10 LAB — HCG, SERUM, QUALITATIVE: Preg, Serum: NEGATIVE

## 2024-04-10 LAB — CBC WITH DIFFERENTIAL/PLATELET
Basophils Absolute: 0 K/uL (ref 0.0–0.1)
Basophils Relative: 0 %
Eosinophils Absolute: 0 K/uL (ref 0.0–0.5)
Eosinophils Relative: 0 %
HCT: 28.6 % — ABNORMAL LOW (ref 36.0–46.0)
Hemoglobin: 7.4 g/dL — ABNORMAL LOW (ref 12.0–15.0)
Lymphocytes Relative: 22 %
Lymphs Abs: 3 K/uL (ref 0.7–4.0)
MCH: 18 pg — ABNORMAL LOW (ref 26.0–34.0)
MCHC: 25.9 g/dL — ABNORMAL LOW (ref 30.0–36.0)
MCV: 69.4 fL — ABNORMAL LOW (ref 80.0–100.0)
Monocytes Absolute: 0 K/uL — ABNORMAL LOW (ref 0.1–1.0)
Monocytes Relative: 0 %
Neutro Abs: 10.6 K/uL — ABNORMAL HIGH (ref 1.7–7.7)
Neutrophils Relative %: 78 %
Platelets: 516 K/uL — ABNORMAL HIGH (ref 150–400)
RBC: 4.12 MIL/uL (ref 3.87–5.11)
RDW: 18.3 % — ABNORMAL HIGH (ref 11.5–15.5)
WBC: 13.6 K/uL — ABNORMAL HIGH (ref 4.0–10.5)
nRBC: 0 % (ref 0.0–0.2)

## 2024-04-10 LAB — BASIC METABOLIC PANEL WITH GFR
Anion gap: 13 (ref 5–15)
BUN: <5 mg/dL — ABNORMAL LOW (ref 6–20)
CO2: 22 mmol/L (ref 22–32)
Calcium: 9.5 mg/dL (ref 8.9–10.3)
Chloride: 105 mmol/L (ref 98–111)
Creatinine, Ser: 0.7 mg/dL (ref 0.44–1.00)
GFR, Estimated: >60 mL/min
Glucose, Bld: 97 mg/dL (ref 70–99)
Potassium: 3.1 mmol/L — ABNORMAL LOW (ref 3.5–5.1)
Sodium: 140 mmol/L (ref 135–145)

## 2024-04-10 LAB — RESP PANEL BY RT-PCR (RSV, FLU A&B, COVID)  RVPGX2
Influenza A by PCR: NEGATIVE
Influenza B by PCR: NEGATIVE
Resp Syncytial Virus by PCR: NEGATIVE
SARS Coronavirus 2 by RT PCR: NEGATIVE

## 2024-04-10 LAB — TROPONIN T, HIGH SENSITIVITY: Troponin T High Sensitivity: <15 ng/L (ref 0–19)

## 2024-04-10 MED ORDER — SODIUM CHLORIDE 0.9% IV SOLUTION: Freq: Once | INTRAVENOUS | Status: DC

## 2024-04-10 MED ORDER — ACETAMINOPHEN 500 MG PO TABS
1000.0000 mg | ORAL_TABLET | Freq: Once | ORAL | Status: AC
  Administered 2024-04-10: 1000 mg via ORAL
  Filled 2024-04-10: qty 2

## 2024-04-10 MED ORDER — IPRATROPIUM-ALBUTEROL 0.5-2.5 (3) MG/3ML IN SOLN
3.0000 mL | Freq: Once | RESPIRATORY_TRACT | Status: AC
  Administered 2024-04-10: 3 mL via RESPIRATORY_TRACT
  Filled 2024-04-10: qty 3

## 2024-04-10 MED ORDER — MORPHINE SULFATE (PF) 4 MG/ML IV SOLN
4.0000 mg | Freq: Once | INTRAVENOUS | Status: AC
  Administered 2024-04-10: 4 mg via INTRAVENOUS
  Filled 2024-04-10: qty 1

## 2024-04-10 NOTE — ED Triage Notes (Signed)
 Pt bib [facility] from home with c/o chills, SOB, and chest pain x2 weeks. Followed by pulmonology for h/o bronchitis, asthma, and pulmonary embolisms.

## 2024-04-10 NOTE — ED Provider Notes (Signed)
 " [facility] Provider Note   CSN: [id number] Arrival date & time: 04/10/24  1844     History  Chief Complaint  Patient presents with   Shortness of Breath    Dana Hughes is a 43 y.o. female with PMH as listed below who presents [facility] from home with c/o chills, SOB, and chest pain x2 weeks. Today she was walking and walked up a hill when her sxs got worse, had central chest pain, SOB, and nausea/vomiting. Reports her CP right now is 11/10. Followed by pulmonology for h/o bronchitis, asthma, and pulmonary embolisms. Takes eliquis  and hasn't missed any doses. Endorses worsening bilateral ankle swelling x 1 week as well. Reports dry cough x 1.5 months. Endorses headache as well. No meds by EMS. Reports rectal bleeding last week for one day, several stools that were just blood, none currently. No current vaginal bleeding but did have her period twice this month. Never had to have a blood transfusion before.   Past Medical History:  Diagnosis Date   Anxiety    Arthritis    Asthma    BPPV (benign paroxysmal positional vertigo) 05/18/2022   Bronchitis    BV (bacterial vaginosis)    Candidiasis    Carpal tunnel syndrome, bilateral 01/16/2019   Chest pain of uncertain etiology 07/18/2021   Constipation 12/10/2020   Cystocele without uterine prolapse    Depression    Diabetes mellitus without complication (HCC)    Endometriosis    Exertional dyspnea 06/24/2021   Headache(784.0)    migraines   Hypertension    no longer takes meds   Migraines    Mild concentric left ventricular hypertrophy (LVH) 08/12/2020   Echo (07/2020):  1. Left ventricular ejection fraction, by estimation, is 45 to 50%. The  left ventricle has mildly decreased function. The left ventricle  demonstrates global hypokinesis. There is mild asymmetric left ventricular  hypertrophy of the basal-septal segment.  The interventricular septum is flattened in systole,  consistent with right  ventricular pressure overload.   2. Right ve   Muscle spasm    Ovarian cyst    Pelvic pain in female 09/29/2015   Rectal bleeding 12/09/2020   FHx:   Father: colon cancer (78 yo)  Paternal aunt: colon cancer (~12 yo), breast cancer (unknown age).  Paternal gma: Breast cancer   Paternal 2nd cousin: Ovarian cancer     Colonoscopy (07/2020):  Perianal and digital rectal examinations: normal.  Findings:  - A patchy area of moderately nodular mucosa was found at the hepatic flexure and in the ascending colon.  - A patchy area of moderately no   Scoliosis    Thrombocytosis 08/12/2020   Uterine fibroid    Vulvovaginal candidiasis 05/15/2018       Home Medications Prior to Admission medications  Medication Sig Start Date End Date Taking? Authorizing Provider  acetaminophen  (TYLENOL ) 500 MG tablet Take 2 tablets (1,000 mg total) by mouth every 6 (six) hours as needed for mild pain (or Fever >/= 101). 08/12/20   Christian, [name], MD  albuterol  (PROVENTIL ) (2.5 MG/3ML) 0.083% nebulizer solution Take 3 mLs (2.5 mg total) by nebulization every 6 (six) hours as needed for wheezing or shortness of breath. 07/17/21   Susen Pastor, MD  albuterol  (VENTOLIN  HFA) 108 (90 Base) MCG/ACT inhaler Inhale 2 puffs into the lungs every 6 (six) hours as needed for wheezing or shortness of breath. Reported on 09/29/2015 01/27/24   D'Mello, Rosalyn, DO  amLODipine  (  NORVASC ) 5 MG tablet Take 1 tablet (5 mg total) by mouth daily. 11/13/23 11/12/24  Nooruddin, Saad, MD  apixaban  (ELIQUIS ) 5 MG TABS tablet Take 1 tablet (5 mg total) by mouth 2 (two) times daily. 11/13/23 03/26/24  Nooruddin, Saad, MD  ARIPiprazole  (ABILIFY ) 20 MG tablet Take 1 tablet (20 mg total) by mouth daily. Patient taking differently: Take 20 mg by mouth at bedtime. 06/27/22   Harl Zane BRAVO, NP  baclofen  (LIORESAL ) 20 MG tablet Take 1 tablet (20 mg total) by mouth 3 (three) times daily. 09/14/23   Reddick, Johnathan B, NP   busPIRone  (BUSPAR ) 10 MG tablet Take 10 mg by mouth 3 (three) times daily. 02/06/24   [provider]  chlorhexidine  (PERIDEX ) 0.12 % solution Rinse with 15ml by mouth for 1 minute then spit, 2 (two) times daily for 10 days. 09/25/23     Continuous Glucose Sensor (DEXCOM G7 SENSOR) MISC Please use to check sugar and change sensor every 10 days 11/13/23   Nooruddin, Saad, MD  dicyclomine  (BENTYL ) 10 MG capsule Take 10 mg by mouth 4 (four) times daily. 02/13/24   [provider]  dicyclomine  (BENTYL ) 20 MG tablet Take 1 tablet (20 mg total) by mouth 3 (three) times daily as needed for spasms. 07/10/23   Harrie Bruckner, DO  EMBECTA PEN NEEDLE NANO 32G X 4 MM MISC SMARTSIG:injection 3 Times Daily 02/02/24   [provider]  famotidine  (PEPCID ) 20 MG tablet Take 1 tablet (20 mg total) by mouth 2 (two) times daily. 11/02/23   Raspet, Erin K, PA-C  fluconazole  (DIFLUCAN ) 150 MG tablet Take 1 tablet (150 mg total) by mouth daily. If symptoms persist take second dose of Diflucan  150 mg on day 3 to treat vaginal Candida. 09/14/23   Reddick, Johnathan B, NP  furosemide  (LASIX ) 20 MG tablet Take 2 tablets (40 mg total) by mouth daily. 11/06/22   Masters, Katie, DO  gabapentin  (NEURONTIN ) 300 MG capsule Take 1 capsule (300 mg total) by mouth 3 (three) times daily. Patient taking differently: Take 300 mg by mouth 2 (two) times daily. 06/27/22   Harl Zane BRAVO, NP  ibuprofen  (ADVIL ) 800 MG tablet Take 1 tablet every 8 hours as needed for pain 09/25/23     Insulin  Aspart FlexPen (NOVOLOG ) 100 UNIT/ML INECT 12 UNITS SUBCUTANEOUSLY 2 (TWO) TIMES DAILY WITH A MEAL 01/30/24   [name], DO  insulin  glargine (LANTUS  SOLOSTAR) 100 UNIT/ML Solostar Pen Inject 30 Units into the skin daily. 11/13/23   Nooruddin, Saad, MD  lamoTRIgine  (LAMICTAL ) 150 MG tablet Take 1 tablet (150 mg total) by mouth daily. 06/27/22   Harl Zane BRAVO, NP  lidocaine  (HM LIDOCAINE  PATCH) 4 % Place 1 patch onto the  skin daily as needed. 07/10/23   Harrie Bruckner, DO  lidocaine  (XYLOCAINE ) 5 % ointment Apply 1 Application topically as needed. 07/10/23   Harrie Bruckner, DO  metFORMIN  (GLUCOPHAGE ) 1000 MG tablet Take 1 tablet (1,000 mg total) by mouth 2 (two) times daily with a meal. 05/31/23   Tobie Gaines, DO  methocarbamol  (ROBAXIN ) 500 MG tablet Take 1 tablet (500 mg total) by mouth 3 (three) times daily as needed (muscle spasms). 11/19/23     metroNIDAZOLE  (FLAGYL ) 500 MG tablet Take 1 tablet (500 mg total) by mouth 2 (two) times daily. 09/14/23   Reddick, Johnathan B, NP  mirtazapine  (REMERON ) 45 MG tablet Take 1 tablet (45 mg total) by mouth at bedtime. 06/27/22   Harl Zane BRAVO, NP  mometasone -formoterol  (DULERA ) 200-5  MCG/ACT AERO Inhale 2 puffs into the lungs 2 (two) times daily. 08/16/22   Elnora Ip, MD  omeprazole  (PRILOSEC) 40 MG capsule Take 1 capsule (40 mg total) by mouth daily. 02/09/22   Lynwood Lenis, PA-C  potassium chloride  (KLOR-CON ) 10 MEQ tablet Take 1 tablet (10 mEq total) by mouth daily. 11/03/23   Raspet, Erin K, PA-C  Rimegepant Sulfate  (NURTEC) 75 MG TBDP Take 1 tablet (75 mg total) by mouth once as needed for up to 1 dose. Max 75mg  in 24 hours 11/13/23   Nooruddin, Saad, MD  Semaglutide ,0.25 or 0.5MG /DOS, 2 MG/3ML SOPN Inject 0.25 mg into the skin once a week. 11/13/23   Nooruddin, Saad, MD  sucralfate  (CARAFATE ) 1 g tablet Take 1 tablet (1 g total) by mouth 4 (four) times daily -  with meals and at bedtime. 11/02/23   Raspet, Erin K, PA-C  SUMAtriptan  (IMITREX ) 50 MG tablet Take 1 tablet (50 mg total) by mouth as needed for migraine. May repeat in 2 hours if headache persists or recurs. Do not take more than 2 tablets per day. 02/06/24   Syeda, Raeeha, DO  Tasimelteon  (HETLIOZ ) 20 MG CAPS Take 20 mg by mouth at bedtime. 06/27/22   Harl Zane BRAVO, NP      Allergies    Mushroom extract complex (obsolete), Latex, Penicillins, and Wound dressing adhesive    Review of  Systems   Review of Systems A 10 point review of systems was performed and is negative unless otherwise reported in HPI.  Physical Exam Updated Vital Signs BP (!) 146/96 (BP Location: Right Arm)   Pulse 88   Temp 99.2 F (37.3 C) (Oral)   Resp 18   SpO2 100%  Physical Exam General: Normal appearing female, lying in bed.  HEENT: PERRLA, Sclera anicteric, MMM, trachea midline.  Cardiology: RRR, no murmurs/rubs/gallops.  Resp: Normal respiratory rate and effort. CTAB, no wheezes, rhonchi, crackles.  Abd: Soft, non-tender, non-distended. No rebound tenderness or guarding.  GU: Deferred. MSK: No peripheral edema or signs of trauma. Extremities without deformity or TTP. No cyanosis or clubbing. Skin: warm, dry.  Neuro: A&Ox4, CNs II-XII grossly intact. MAEs. Sensation grossly intact.  Psych: Normal mood and affect.   ED Results / Procedures / Treatments   Labs (all labs ordered are listed, but only abnormal results are displayed) Labs Reviewed  CBC WITH DIFFERENTIAL/PLATELET - Abnormal; Notable for the following components:      Result Value   WBC 13.6 (*)    Hemoglobin 7.4 (*)    HCT 28.6 (*)    MCV 69.4 (*)    MCH 18.0 (*)    MCHC 25.9 (*)    RDW 18.3 (*)    Platelets 516 (*)    All other components within normal limits  BASIC METABOLIC PANEL WITH GFR - Abnormal; Notable for the following components:   Potassium 3.1 (*)    BUN <5 (*)    All other components within normal limits  RESP PANEL BY RT-PCR (RSV, FLU A&B, COVID)  RVPGX2  PRO BRAIN NATRIURETIC PEPTIDE  HCG, SERUM, QUALITATIVE  TROPONIN T, HIGH SENSITIVITY    EKG EKG Interpretation Date/Time:  Friday April 10 2024 20:56:23 EST Ventricular Rate:  72 PR Interval:  137 QRS Duration:  94 QT Interval:  399 QTC Calculation: 437 R Axis:   -45  Text Interpretation: Sinus rhythm LAD, consider left anterior fascicular block Borderline T abnormalities, diffuse leads Confirmed by Franklyn Gills [phone]) on  04/10/2024 9:04:07 PM  Radiology  DG Chest Portable 1 View Result Date: 04/10/2024 EXAM: 1 VIEW(S) XRAY OF THE CHEST 04/10/2024 07:56:00 PM COMPARISON: 04/01/2024 CLINICAL HISTORY: SOB FINDINGS: LINES, TUBES AND DEVICES: Multiple overlying monitor wires. LUNGS AND PLEURA: No focal pulmonary opacity. No pleural effusion. No pneumothorax. HEART AND MEDIASTINUM: No acute abnormality of the cardiac and mediastinal silhouettes. BONES AND SOFT TISSUES: No acute osseous abnormality. IMPRESSION: 1. No acute process. Electronically signed by: Norman Gatlin MD 04/10/2024 08:03 PM EST RP Workstation: [id number]    Procedures Procedures    Medications Ordered in ED Medications  morphine  (PF) 4 MG/ML injection 4 mg (4 mg Intravenous Given 04/10/24 2059)  acetaminophen  (TYLENOL ) tablet 1,000 mg (1,000 mg Oral Given 04/10/24 2058)  ipratropium-albuterol  (DUONEB) 0.5-2.5 (3) MG/3ML nebulizer solution 3 mL (3 mLs Nebulization Given 04/10/24 2059)    ED Course/ Medical Decision Making/ A&P                          Medical Decision Making Amount and/or Complexity of Data Reviewed Labs: ordered. Decision-making details documented in ED Course. Radiology: ordered. Decision-making details documented in ED Course.  Risk OTC drugs. Prescription drug management.    This patient presents to the ED for concern of exertional CP/SOB, this involves an extensive number of treatment options, and is a complaint that carries with it a high risk of complications and morbidity.  I considered the following differential and admission for this acute, potentially life threatening condition.   MDM:    DDX for chest pain includes but is not limited to:  Ultimately patient w/ exertional CP and SOB. Doubt ACS/angina w/ no arrhythmia or signs of ischemia on EKG, negative troponin.  Also had reported bilateral ankle swelling but has negative proBNP and chest x-ray does not demonstrate pulmonary edema, pleural effusion,  pneumonia.  No PTX. Has h/o asthma, thought potentially increased wheezing lately but no wheezing on presentation, tried duoneb but did not significantly help. Doubt aortic dissection.  Patient does have a history of pulmonary embolism but no signs of DVT, states this does not feel the same, overall doubt PE given compliance w/ eliquis  and in s/o Hgb returning at 7.4. Patient reports heavy periods, suspect this as cause. Has had bloody stools in the past but DRE performed w/ RN chaperone w/o any obvious bleeding or abnormalities. No active vaginal bleeding. P/w leukocytosis but no abdominal pain, no fever/chills, no cough, doubt infection or PNA. Mild hypokalemia, will replete, no potassium-wasting medications. Will give patient 1U PRBCs for microcytic anemia.  Patient is instructed to follow-up with GI as well as with gynecology for her history of rectal bleeding and history of vaginal bleeding.  Again not currently actively bleeding.  Can be reevaluated after blood transfusion and likely discharge with outpatient follow-up.  Clinical Course as of 04/17/24 2251  Fri Apr 10, 2024  2015 DG Chest Portable 1 View 1. No acute process. [HN]  2042 Hemoglobin(!): 7.4 +anemia [HN]  2042 Potassium(!): 3.1 Mild hypokalemia [HN]  2042 WBC(!): 13.6 Increasing leukocytosis [HN]  2139 Pro Brain Natriuretic Peptide: 126.0 [HN]  2234 Troponin T High Sensitivity: <15 [HN]    Clinical Course User Index [HN] Franklyn Sid SAILOR, MD    Labs: I Ordered, and personally interpreted labs.  The pertinent results include:  those listed above  Imaging Studies ordered: I ordered imaging studies including CXR I independently visualized and interpreted imaging. I agree with the radiologist interpretation  Additional history obtained from chart review.  Cardiac Monitoring: The patient was maintained on a cardiac monitor.  I personally viewed and interpreted the cardiac monitored which showed an underlying rhythm of:  NSR  Reevaluation: After the interventions noted above, I reevaluated the patient and found that they have :improved  Social Determinants of Health: Lives independently  Disposition:  Patient is signed out to the oncoming ED physician who is made aware of her history, presentation, exam, workup, and plan. Plan is to give PRBCs and reevaluate, likely DC. F/u with OB/Gyn.    Co morbidities that complicate the patient evaluation  Past Medical History:  Diagnosis Date   Anxiety    Arthritis    Asthma    BPPV (benign paroxysmal positional vertigo) 05/18/2022   Bronchitis    BV (bacterial vaginosis)    Candidiasis    Carpal tunnel syndrome, bilateral 01/16/2019   Chest pain of uncertain etiology 07/18/2021   Constipation 12/10/2020   Cystocele without uterine prolapse    Depression    Diabetes mellitus without complication (HCC)    Endometriosis    Exertional dyspnea 06/24/2021   Headache(784.0)    migraines   Hypertension    no longer takes meds   Migraines    Mild concentric left ventricular hypertrophy (LVH) 08/12/2020   Echo (07/2020):  1. Left ventricular ejection fraction, by estimation, is 45 to 50%. The  left ventricle has mildly decreased function. The left ventricle  demonstrates global hypokinesis. There is mild asymmetric left ventricular  hypertrophy of the basal-septal segment.  The interventricular septum is flattened in systole, consistent with right  ventricular pressure overload.   2. Right ve   Muscle spasm    Ovarian cyst    Pelvic pain in female 09/29/2015   Rectal bleeding 12/09/2020   FHx:   Father: colon cancer (54 yo)  Paternal aunt: colon cancer (~73 yo), breast cancer (unknown age).  Paternal gma: Breast cancer   Paternal 2nd cousin: Ovarian cancer     Colonoscopy (07/2020):  Perianal and digital rectal examinations: normal.  Findings:  - A patchy area of moderately nodular mucosa was found at the hepatic flexure and in the ascending colon.  - A  patchy area of moderately no   Scoliosis    Thrombocytosis 08/12/2020   Uterine fibroid    Vulvovaginal candidiasis 05/15/2018     Medicines Meds ordered this encounter  Medications   morphine  (PF) 4 MG/ML injection 4 mg   acetaminophen  (TYLENOL ) tablet 1,000 mg    I have reviewed the patients home medicines and have made adjustments as needed  Problem List / ED Course: Problem List Items Addressed This Visit       Other   Chest pain (Chronic)   Other Visit Diagnoses       Microcytic anemia    -  Primary     Hypokalemia                       This note was created using dictation software, which may contain spelling or grammatical errors.    Franklyn Sid SAILOR, MD 04/17/24 [phone]  "

## 2024-04-11 LAB — POC OCCULT BLOOD, ED: Fecal Occult Blood, POC: NEGATIVE

## 2024-04-11 LAB — TROPONIN T, HIGH SENSITIVITY: Troponin T High Sensitivity: <15 ng/L (ref 0–19)

## 2024-04-11 LAB — PREPARE RBC (CROSSMATCH)

## 2024-04-11 MED ORDER — POTASSIUM CHLORIDE CRYS ER 20 MEQ PO TBCR: 40.0000 meq | EXTENDED_RELEASE_TABLET | ORAL | Status: DC

## 2024-04-12 NOTE — Progress Notes (Unsigned)
 "   [facility] [phone]   [phone]  CONSULT NOTE  REFERRING PHYSICIAN: Dr. Francesco   REASON FOR CONSULTATION:  History of PE  HPI Dana Hughes is a 43 y.o. female with a past medical history significant for hypertension, heart failure, migraines, pulmonary embolism, asthma, diabetes, fibroids, endometriosis, depression, iron  deficiency anemia and ***is referred to the clinic for history of pulmonary embolism.  Per chart review, the patient was diagnosed with pulmonary embolism in January 2025.  CT angio showed pulmonary emboli throughout the segmental and subsegmental branches of the right lower lobe weblike pulmonary embolism in the distal left lower lobe pulmonary artery.  No evidence of heart strain. ***Did she have a PE while on eliquis ??? She reportedly was taking this as precribed. DVT study did not show clots in the legs. She was referred to hematology for hypercoag workup.    She also had a prior history of pulmonary emboli in the past. Ask when first PE was April 2024 with bilateral pulmonary embolism with associated right heart strain.   Regarding risk factors for DVT, ***  ***Ask about prior DVT and occupation.    He did not have any recent surgery within the last 3 months for the most recent episode of PE.  He denied any recent trauma, hospital admissions, or acute illness.  Denied any paralysis.  Denied any cast or immobilization.  He denied any bed rest.  Denies any hormone therapy.  He denies any erythropoiesis stimulating agents.  He denies any recent COVID.  He denies any smoking.  He denies any chronic inflammatory conditions, active cancer, or obesity.   She was not anemic until she started developing anemia in July 2025.   She started having more significant anemia with Hbg 7.4 first noted around 03/26/24.   One stool card was performed in the ER which was *** for blood.   They recommended she start taking iron  supplements  with ferrous sulfate  325 mg. She started this a ***weeks ago. She has been tolerating this well without any adverse side effects***. She also was given stool cards. Compliant ***    She is referred to clinic for further evaluation recommendations regarding her anemia.   She reporters fatigue, dyspnea, lightheadedness, palpitations, or cold intolerance.     The oldest records available to me are from 2010 . She has had intermittent anemia, some mild but some more significant. For example, Hbg got down to 8.9 in May 2016. She had intermittent periods of anemia. She had anemia in 2016. In April 2022, her Hbg dropped to 8.2. She had persistent anemia in 2022- that improved in August 2024 then started to become more significant again in December 2025.   She has never required a blood transfusion.****. She required fereheme in the past in April 2024.    She reports her menstrual cycles are *** for her. She may need to change her *** tampon every **** hours or so. She denies clots. She denies spotting between her cycles. She has menstrual periods every *** weeks or so.    She denies passage of clots, intermenstrual spotting, or abnormal bleeding outside of menses, including epistaxis, gingival bleeding, hematuria, hematochezia, or hematemesis.     Per chart review, she had endoscopy and colonoscopy in 2022 under the care of Dr. Loretta. This showed erosive gastropathy with no bleeding and no stigmata of recent bleeding. The colonoscopy showed inadequate prep. It showed, non- bleeding external and internal hemorrhoids. Two 1 to  2 mm polyps in the cecum.    She denies any fever or night sweats. She denies any lymphadenopathy. She denies any NSAID use.  She estimates that she eats red meat *** a week.  She denies any chronic kidney disease.  She does ***chew ice chips. Denies any history of bariatric surgery.    HPI  Past Medical History:  Diagnosis Date   Anxiety    Arthritis    Asthma    BPPV  (benign paroxysmal positional vertigo) 05/18/2022   Bronchitis    BV (bacterial vaginosis)    Candidiasis    Carpal tunnel syndrome, bilateral 01/16/2019   Chest pain of uncertain etiology 07/18/2021   Constipation 12/10/2020   Cystocele without uterine prolapse    Depression    Diabetes mellitus without complication (HCC)    Endometriosis    Exertional dyspnea 06/24/2021   Headache(784.0)    migraines   Hypertension    no longer takes meds   Migraines    Mild concentric left ventricular hypertrophy (LVH) 08/12/2020   Echo (07/2020):  1. Left ventricular ejection fraction, by estimation, is 45 to 50%. The  left ventricle has mildly decreased function. The left ventricle  demonstrates global hypokinesis. There is mild asymmetric left ventricular  hypertrophy of the basal-septal segment.  The interventricular septum is flattened in systole, consistent with right  ventricular pressure overload.   2. Right ve   Muscle spasm    Ovarian cyst    Pelvic pain in female 09/29/2015   Rectal bleeding 12/09/2020   FHx:   Father: colon cancer (43 yo)  Paternal aunt: colon cancer (~6 yo), breast cancer (unknown age).  Paternal gma: Breast cancer   Paternal 2nd cousin: Ovarian cancer     Colonoscopy (07/2020):  Perianal and digital rectal examinations: normal.  Findings:  - A patchy area of moderately nodular mucosa was found at the hepatic flexure and in the ascending colon.  - A patchy area of moderately no   Scoliosis    Thrombocytosis 08/12/2020   Uterine fibroid    Vulvovaginal candidiasis 05/15/2018    Past Surgical History:  Procedure Laterality Date   BIOPSY  08/12/2020   Procedure: BIOPSY;  Surgeon: Shila Gustav GAILS, MD;  Location: [facility];  Service: Endoscopy;;   CHOLECYSTECTOMY     COLONOSCOPY WITH PROPOFOL  N/A 08/12/2020   Procedure: COLONOSCOPY WITH PROPOFOL ;  Surgeon: Shila Gustav GAILS, MD;  Location: [facility];  Service: Endoscopy;  Laterality: N/A;    ESOPHAGOGASTRODUODENOSCOPY (EGD) WITH PROPOFOL  N/A 08/12/2020   Procedure: ESOPHAGOGASTRODUODENOSCOPY (EGD) WITH PROPOFOL ;  Surgeon: Shila Gustav GAILS, MD;  Location: [facility];  Service: Endoscopy;  Laterality: N/A;   IR ANGIOGRAM PULMONARY BILATERAL SELECTIVE  08/22/2022   IR ANGIOGRAM SELECTIVE EACH ADDITIONAL VESSEL  08/22/2022   IR ANGIOGRAM SELECTIVE EACH ADDITIONAL VESSEL  08/22/2022   IR INFUSION THROMBOL ARTERIAL INITIAL (MS)  08/13/2022   IR THROMB F/U EVAL ART/VEN FINAL DAY (MS)  08/14/2022   IR US  GUIDE VASC ACCESS RIGHT  08/13/2022   POLYPECTOMY  08/12/2020   Procedure: POLYPECTOMY;  Surgeon: Shila Gustav GAILS, MD;  Location: [facility];  Service: Endoscopy;;   TUBAL LIGATION      Family History  Problem Relation Age of Onset   Migraines Mother    Cancer Father    Colon cancer Father    Lupus Paternal Grandmother    Cancer Paternal Grandmother    Breast cancer Paternal Grandmother    Migraines Maternal Aunt    Hypertension Maternal  Aunt    Breast cancer Maternal Aunt    Colon cancer Paternal Aunt    Breast cancer Paternal Aunt    Ovarian cancer Cousin     Social History Social History[1]  Allergies[2]  Current Outpatient Medications  Medication Sig Dispense Refill   acetaminophen  (TYLENOL ) 500 MG tablet Take 2 tablets (1,000 mg total) by mouth every 6 (six) hours as needed for mild pain (or Fever >/= 101). 30 tablet 0   albuterol  (PROVENTIL ) (2.5 MG/3ML) 0.083% nebulizer solution Take 3 mLs (2.5 mg total) by nebulization every 6 (six) hours as needed for wheezing or shortness of breath. 75 mL 0   albuterol  (VENTOLIN  HFA) 108 (90 Base) MCG/ACT inhaler Inhale 2 puffs into the lungs every 6 (six) hours as needed for wheezing or shortness of breath. Reported on 09/29/2015     amLODipine  (NORVASC ) 5 MG tablet Take 1 tablet (5 mg total) by mouth daily. 90 tablet 3   apixaban  (ELIQUIS ) 5 MG TABS tablet Take 1 tablet (5 mg total) by mouth 2 (two) times daily. 60 tablet 2    ARIPiprazole  (ABILIFY ) 20 MG tablet Take 1 tablet (20 mg total) by mouth daily. (Patient taking differently: Take 20 mg by mouth at bedtime.) 30 tablet 3   baclofen  (LIORESAL ) 20 MG tablet Take 1 tablet (20 mg total) by mouth 3 (three) times daily. 30 each 0   busPIRone  (BUSPAR ) 10 MG tablet Take 10 mg by mouth 3 (three) times daily.     chlorhexidine  (PERIDEX ) 0.12 % solution Rinse with 15ml by mouth for 1 minute then spit, 2 (two) times daily for 10 days. 473 mL 0   Continuous Glucose Sensor (DEXCOM G7 SENSOR) MISC Please use to check sugar and change sensor every 10 days 3 each 3   dicyclomine  (BENTYL ) 10 MG capsule Take 10 mg by mouth 4 (four) times daily.     dicyclomine  (BENTYL ) 20 MG tablet Take 1 tablet (20 mg total) by mouth 3 (three) times daily as needed for spasms. 90 tablet 3   EMBECTA PEN NEEDLE NANO 32G X 4 MM MISC SMARTSIG:injection 3 Times Daily     famotidine  (PEPCID ) 20 MG tablet Take 1 tablet (20 mg total) by mouth 2 (two) times daily. 30 tablet 0   fluconazole  (DIFLUCAN ) 150 MG tablet Take 1 tablet (150 mg total) by mouth daily. If symptoms persist take second dose of Diflucan  150 mg on day 3 to treat vaginal Candida. 2 tablet 0   furosemide  (LASIX ) 20 MG tablet Take 2 tablets (40 mg total) by mouth daily. 60 tablet 0   gabapentin  (NEURONTIN ) 300 MG capsule Take 1 capsule (300 mg total) by mouth 3 (three) times daily. (Patient taking differently: Take 300 mg by mouth 2 (two) times daily.) 90 capsule 3   ibuprofen  (ADVIL ) 800 MG tablet Take 1 tablet every 8 hours as needed for pain 20 tablet 0   Insulin  Aspart FlexPen (NOVOLOG ) 100 UNIT/ML INECT 12 UNITS SUBCUTANEOUSLY 2 (TWO) TIMES DAILY WITH A MEAL 24 mL 11   insulin  glargine (LANTUS  SOLOSTAR) 100 UNIT/ML Solostar Pen Inject 30 Units into the skin daily. 15 mL 3   lamoTRIgine  (LAMICTAL ) 150 MG tablet Take 1 tablet (150 mg total) by mouth daily. 30 tablet 3   lidocaine  (HM LIDOCAINE  PATCH) 4 % Place 1 patch onto the skin daily as  needed. 6 patch 6   lidocaine  (XYLOCAINE ) 5 % ointment Apply 1 Application topically as needed. 35.44 g 0   metFORMIN  (GLUCOPHAGE ) 1000  MG tablet Take 1 tablet (1,000 mg total) by mouth 2 (two) times daily with a meal. 90 tablet 3   methocarbamol  (ROBAXIN ) 500 MG tablet Take 1 tablet (500 mg total) by mouth 3 (three) times daily as needed (muscle spasms). 90 tablet 2   metroNIDAZOLE  (FLAGYL ) 500 MG tablet Take 1 tablet (500 mg total) by mouth 2 (two) times daily. 14 tablet 0   mirtazapine  (REMERON ) 45 MG tablet Take 1 tablet (45 mg total) by mouth at bedtime. 30 tablet 3   mometasone -formoterol  (DULERA ) 200-5 MCG/ACT AERO Inhale 2 puffs into the lungs 2 (two) times daily. 13 g 2   omeprazole  (PRILOSEC) 40 MG capsule Take 1 capsule (40 mg total) by mouth daily. 30 capsule 1   potassium chloride  (KLOR-CON ) 10 MEQ tablet Take 1 tablet (10 mEq total) by mouth daily. 5 tablet 0   Rimegepant Sulfate  (NURTEC) 75 MG TBDP Take 1 tablet (75 mg total) by mouth once as needed for up to 1 dose. Max 75mg  in 24 hours 30 tablet 3   Semaglutide ,0.25 or 0.5MG /DOS, 2 MG/3ML SOPN Inject 0.25 mg into the skin once a week. 3 mL 3   sucralfate  (CARAFATE ) 1 g tablet Take 1 tablet (1 g total) by mouth 4 (four) times daily -  with meals and at bedtime. 21 tablet 0   SUMAtriptan  (IMITREX ) 50 MG tablet Take 1 tablet (50 mg total) by mouth as needed for migraine. May repeat in 2 hours if headache persists or recurs. Do not take more than 2 tablets per day. 60 tablet 0   Tasimelteon  (HETLIOZ ) 20 MG CAPS Take 20 mg by mouth at bedtime. 30 capsule 3   No current facility-administered medications for this visit.    REVIEW OF SYSTEMS:   Review of Systems  Constitutional: Negative for appetite change, chills, fatigue, fever and unexpected weight change.  HENT:   Negative for mouth sores, nosebleeds, sore throat and trouble swallowing.   Eyes: Negative for eye problems and icterus.  Respiratory: Negative for cough, hemoptysis,  shortness of breath and wheezing.   Cardiovascular: Negative for chest pain and leg swelling.  Gastrointestinal: Negative for abdominal pain, constipation, diarrhea, nausea and vomiting.  Genitourinary: Negative for bladder incontinence, difficulty urinating, dysuria, frequency and hematuria.   Musculoskeletal: Negative for back pain, gait problem, neck pain and neck stiffness.  Skin: Negative for itching and rash.  Neurological: Negative for dizziness, extremity weakness, gait problem, headaches, light-headedness and seizures.  Hematological: Negative for adenopathy. Does not bruise/bleed easily.  Psychiatric/Behavioral: Negative for confusion, depression and sleep disturbance. The patient is not nervous/anxious.     PHYSICAL EXAMINATION:  There were no vitals taken for this visit.  ECOG PERFORMANCE STATUS: {CHL ONC ECOG [id number]  Physical Exam  Constitutional: Oriented to person, place, and time and well-developed, well-nourished, and in no distress. No distress.  HENT:  Head: Normocephalic and atraumatic.  Mouth/Throat: Oropharynx is clear and moist. No oropharyngeal exudate.  Eyes: Conjunctivae are normal. Right eye exhibits no discharge. Left eye exhibits no discharge. No scleral icterus.  Neck: Normal range of motion. Neck supple.  Cardiovascular: Normal rate, regular rhythm, normal heart sounds and intact distal pulses.   Pulmonary/Chest: Effort normal and breath sounds normal. No respiratory distress. No wheezes. No rales.  Abdominal: Soft. Bowel sounds are normal. Exhibits no distension and no mass. There is no tenderness.  Musculoskeletal: Normal range of motion. Exhibits no edema.  Lymphadenopathy:    No cervical adenopathy.  Neurological: Alert and oriented to person,  place, and time. Exhibits normal muscle tone. Gait normal. Coordination normal.  Skin: Skin is warm and dry. No rash noted. Not diaphoretic. No erythema. No pallor.  Psychiatric: Mood, memory and  judgment normal.  Vitals reviewed.  LABORATORY DATA: Lab Results  Component Value Date   WBC 13.6 (H) 04/10/2024   HGB 7.4 (L) 04/10/2024   HCT 28.6 (L) 04/10/2024   MCV 69.4 (L) 04/10/2024   PLT 516 (H) 04/10/2024      Chemistry      Component Value Date/Time   NA 140 04/10/2024 1930   NA 136 07/10/2023 1059   K 3.1 (L) 04/10/2024 1930   CL 105 04/10/2024 1930   CO2 22 04/10/2024 1930   BUN <5 (L) 04/10/2024 1930   BUN 4 (L) 07/10/2023 1059   CREATININE 0.70 04/10/2024 1930      Component Value Date/Time   CALCIUM  9.5 04/10/2024 1930   ALKPHOS 58 03/26/2024 1427   AST 18 03/26/2024 1427   ALT 21 03/26/2024 1427   BILITOT 0.2 03/26/2024 1427   BILITOT 0.3 07/10/2023 1059       RADIOGRAPHIC STUDIES: DG Chest Portable 1 View Result Date: 04/10/2024 EXAM: 1 VIEW(S) XRAY OF THE CHEST 04/10/2024 07:56:00 PM COMPARISON: 04/01/2024 CLINICAL HISTORY: SOB FINDINGS: LINES, TUBES AND DEVICES: Multiple overlying monitor wires. LUNGS AND PLEURA: No focal pulmonary opacity. No pleural effusion. No pneumothorax. HEART AND MEDIASTINUM: No acute abnormality of the cardiac and mediastinal silhouettes. BONES AND SOFT TISSUES: No acute osseous abnormality. IMPRESSION: 1. No acute process. Electronically signed by: Norman Gatlin MD 04/10/2024 08:03 PM EST RP Workstation: [id number]   DG Chest 2 View Result Date: 04/01/2024 CLINICAL DATA:  Shortness of breath EXAM: CHEST - 2 VIEW COMPARISON:  07/18/2023 FINDINGS: The heart size and mediastinal contours are within normal limits. Both lungs are clear. The visualized skeletal structures are unremarkable. IMPRESSION: No active cardiopulmonary disease. Electronically Signed   By: Luke Bun M.D.   On: 04/01/2024 15:20    ASSESSMENT: This is a very pleasant 43 year old African American female with a PMHx of history of PE x2 in April 2024 and January 2025.   She also has anemia.   She was seen with Dr. Sherrod today. She had a CBC, CMP,  iron  studies, stool cards, ferritin, B12, folate, and SPEP.   Her labs today show ***.   I will arrange for IV iron  with feraheme at the [facility] center.   For the history of PE x2, would recommend lifelong anticoagulation due to being high risk for recurrence.   ***Refer to GI for another GI evaluation.   We will arrange for hypercoag workup.   ***F/U  The patient voices understanding of current disease status and treatment options and is in agreement with the current care plan.  All questions were answered. The patient knows to call the clinic with any problems, questions or concerns. We can certainly see the patient much sooner if necessary.  Thank you so much for allowing me to participate in the care of Dana Hughes. I will continue to follow up the patient with you and assist in her care.  I spent {CHL ONC TIME VISIT - DTPQU:[id number]} counseling the patient face to face. The total time spent in the appointment was {CHL ONC TIME VISIT - DTPQU:[id number]}.  Disclaimer: This note was dictated with voice recognition software. Similar sounding words can inadvertently be transcribed and may not be corrected upon review.   Akiel Fennell L Maison Agrusa  April 12, 2024, 1:14 PM       [1]  Social History Tobacco Use   Smoking status: Former    Current packs/day: 0.00    Types: Cigarettes    Quit date: 07/2023    Years since quitting: 0.7   Smokeless tobacco: Never   Tobacco comments:    1-2 cigars/week  Vaping Use   Vaping status: Never Used  Substance Use Topics   Alcohol use: Not Currently    Comment: occasional   Drug use: Yes    Frequency: 7.0 times per week    Types: Marijuana  [2]  Allergies Allergen Reactions   Mushroom Extract Complex (Obsolete) Anaphylaxis   Latex Itching and Swelling   Penicillins Itching    Has patient had a PCN reaction causing immediate rash, facial/tongue/throat swelling, SOB or lightheadedness with hypotension:  No Has patient had a PCN reaction causing severe rash involving mucus membranes or skin necrosis: No Has patient had a PCN reaction that required hospitalization No Has patient had a PCN reaction occurring within the last 10 years: No If all of the above answers are NO, then may proceed with Cephalosporin use.    Wound Dressing Adhesive Itching   "

## 2024-04-13 ENCOUNTER — Other Ambulatory Visit: Payer: Self-pay | Admitting: Physician Assistant

## 2024-04-13 DIAGNOSIS — I2699 Other pulmonary embolism without acute cor pulmonale: Secondary | ICD-10-CM

## 2024-04-13 DIAGNOSIS — D509 Iron deficiency anemia, unspecified: Secondary | ICD-10-CM

## 2024-04-14 ENCOUNTER — Telehealth: Payer: Self-pay

## 2024-04-14 ENCOUNTER — Inpatient Hospital Stay: Payer: MEDICAID

## 2024-04-14 ENCOUNTER — Inpatient Hospital Stay: Payer: MEDICAID | Admitting: Physician Assistant

## 2024-04-14 ENCOUNTER — Telehealth: Payer: Self-pay | Admitting: Pharmacy Technician

## 2024-04-14 VITALS — BP 130/81 | HR 93 | Temp 97.9°F | Resp 17 | Wt 195.0 lb

## 2024-04-14 DIAGNOSIS — Z86718 Personal history of other venous thrombosis and embolism: Secondary | ICD-10-CM | POA: Diagnosis not present

## 2024-04-14 DIAGNOSIS — Z87891 Personal history of nicotine dependence: Secondary | ICD-10-CM | POA: Insufficient documentation

## 2024-04-14 DIAGNOSIS — D509 Iron deficiency anemia, unspecified: Secondary | ICD-10-CM

## 2024-04-14 DIAGNOSIS — Z7901 Long term (current) use of anticoagulants: Secondary | ICD-10-CM | POA: Diagnosis not present

## 2024-04-14 DIAGNOSIS — N921 Excessive and frequent menstruation with irregular cycle: Secondary | ICD-10-CM | POA: Insufficient documentation

## 2024-04-14 DIAGNOSIS — Z86711 Personal history of pulmonary embolism: Secondary | ICD-10-CM | POA: Diagnosis present

## 2024-04-14 DIAGNOSIS — I2699 Other pulmonary embolism without acute cor pulmonale: Secondary | ICD-10-CM

## 2024-04-14 LAB — CMP (CANCER CENTER ONLY)
ALT: 18 U/L (ref 0–44)
AST: 30 U/L (ref 15–41)
Albumin: 4.2 g/dL (ref 3.5–5.0)
Alkaline Phosphatase: 69 U/L (ref 38–126)
Anion gap: 10 (ref 5–15)
BUN: <5 mg/dL — ABNORMAL LOW (ref 6–20)
CO2: 24 mmol/L (ref 22–32)
Calcium: 9.2 mg/dL (ref 8.9–10.3)
Chloride: 105 mmol/L (ref 98–111)
Creatinine: 0.78 mg/dL (ref 0.44–1.00)
GFR, Estimated: >60 mL/min
Glucose, Bld: 190 mg/dL — ABNORMAL HIGH (ref 70–99)
Potassium: 3.9 mmol/L (ref 3.5–5.1)
Sodium: 138 mmol/L (ref 135–145)
Total Bilirubin: 0.2 mg/dL (ref 0.0–1.2)
Total Protein: 7.7 g/dL (ref 6.5–8.1)

## 2024-04-14 LAB — CBC WITH DIFFERENTIAL (CANCER CENTER ONLY)
Abs Immature Granulocytes: 0.03 K/uL (ref 0.00–0.07)
Basophils Absolute: 0 K/uL (ref 0.0–0.1)
Basophils Relative: 1 %
Eosinophils Absolute: 0.1 K/uL (ref 0.0–0.5)
Eosinophils Relative: 2 %
HCT: 28.1 % — ABNORMAL LOW (ref 36.0–46.0)
Hemoglobin: 7.6 g/dL — ABNORMAL LOW (ref 12.0–15.0)
Immature Granulocytes: 0 %
Lymphocytes Relative: 32 %
Lymphs Abs: 2.8 K/uL (ref 0.7–4.0)
MCH: 18.2 pg — ABNORMAL LOW (ref 26.0–34.0)
MCHC: 27 g/dL — ABNORMAL LOW (ref 30.0–36.0)
MCV: 67.2 fL — ABNORMAL LOW (ref 80.0–100.0)
Monocytes Absolute: 0.6 K/uL (ref 0.1–1.0)
Monocytes Relative: 6 %
Neutro Abs: 5.2 K/uL (ref 1.7–7.7)
Neutrophils Relative %: 59 %
Platelet Count: 680 K/uL — ABNORMAL HIGH (ref 150–400)
RBC: 4.18 MIL/uL (ref 3.87–5.11)
RDW: 18.6 % — ABNORMAL HIGH (ref 11.5–15.5)
Smear Review: NORMAL
WBC Count: 8.7 K/uL (ref 4.0–10.5)
nRBC: 0 % (ref 0.0–0.2)

## 2024-04-14 LAB — IRON AND IRON BINDING CAPACITY (CC-WL,HP ONLY)
Iron: 13 ug/dL — ABNORMAL LOW (ref 28–170)
Saturation Ratios: 3 % — ABNORMAL LOW (ref 10.4–31.8)
TIBC: 476 ug/dL — ABNORMAL HIGH (ref 250–450)
UIBC: 463 ug/dL

## 2024-04-14 LAB — VITAMIN B12: Vitamin B-12: 490 pg/mL (ref 180–914)

## 2024-04-14 LAB — SAMPLE TO BLOOD BANK

## 2024-04-14 LAB — FERRITIN: Ferritin: 7 ng/mL — ABNORMAL LOW (ref 11–307)

## 2024-04-14 LAB — ANTITHROMBIN III: AntiThromb III Func: 84 % (ref 75–120)

## 2024-04-14 LAB — FOLATE: Folate: 10.3 ng/mL

## 2024-04-14 NOTE — Telephone Encounter (Signed)
 Auth Submission: NO AUTH NEEDED Site of care: Site of care: [facility] Payer: [employer] Medication & CPT/J Code(s) submitted: Venofer (Iron  Sucrose) J1756 Diagnosis Code: D50.9 Route of submission (phone, fax, portal):  Phone # Fax # Auth type: Buy/Bill PB Units/visits requested: 3 Reference number:  Approval from: 12/23/225 to 04/22/24

## 2024-04-14 NOTE — Telephone Encounter (Signed)
 Spoke with patient regarding Dr. Blas recommendations. Informed patient that Dr. Sherrod would like to proceed with iron  infusions and that no blood products are recommended at this time. Patient was instructed to continue taking over-the-counter iron  supplementation. Advised patient to contact the office if she experiences any worsening symptoms of anemia, at which time labs can be checked sooner. Patient verbalized understanding.

## 2024-04-15 ENCOUNTER — Encounter: Payer: Self-pay | Admitting: Pulmonary Disease

## 2024-04-15 ENCOUNTER — Ambulatory Visit: Payer: MEDICAID | Admitting: Orthopaedic Surgery

## 2024-04-15 LAB — TYPE AND SCREEN
ABO/RH(D): A POS
Antibody Screen: NEGATIVE
Unit division: 0

## 2024-04-15 LAB — BPAM RBC
Blood Product Expiration Date: 202601112359
Unit Type and Rh: 6200

## 2024-04-15 LAB — HOMOCYSTEINE: Homocysteine: 8.7 umol/L (ref 0.0–14.5)

## 2024-04-16 LAB — LUPUS ANTICOAGULANT PANEL
DRVVT: 26.4 s (ref 0.0–47.0)
PTT Lupus Anticoagulant: 27 s (ref 0.0–43.5)

## 2024-04-16 LAB — PROTEIN C ACTIVITY: Protein C Activity: 78 % (ref 73–180)

## 2024-04-16 LAB — BETA-2-GLYCOPROTEIN I ABS, IGG/M/A
Beta-2 Glyco I IgG: <9 GPI IgG units (ref 0–20)
Beta-2-Glycoprotein I IgA: <9 GPI IgA units (ref 0–25)
Beta-2-Glycoprotein I IgM: <9 GPI IgM units (ref 0–32)

## 2024-04-16 LAB — PROTEIN S ACTIVITY: Protein S Activity: 115 % (ref 63–140)

## 2024-04-16 LAB — PROTEIN S, TOTAL: Protein S Ag, Total: 112 % (ref 60–150)

## 2024-04-17 ENCOUNTER — Encounter: Payer: Self-pay | Admitting: Student

## 2024-04-17 LAB — CARDIOLIPIN ANTIBODIES, IGG, IGM, IGA
Anticardiolipin IgA: <9 U/mL (ref 0–11)
Anticardiolipin IgG: <9 GPL U/mL (ref 0–14)
Anticardiolipin IgM: <9 [MPL'U]/mL (ref 0–12)

## 2024-04-17 LAB — PROTEIN C, TOTAL: Protein C, Total: 71 % (ref 60–150)

## 2024-04-20 LAB — PROTHROMBIN GENE MUTATION

## 2024-04-20 LAB — FACTOR 5 LEIDEN

## 2024-04-22 DIAGNOSIS — Z1231 Encounter for screening mammogram for malignant neoplasm of breast: Secondary | ICD-10-CM

## 2024-04-28 ENCOUNTER — Ambulatory Visit: Payer: MEDICAID

## 2024-04-28 MED ORDER — IRON SUCROSE 300 MG IVPB - SIMPLE MED: 300.0000 mg | Freq: Once | Status: DC

## 2024-04-28 MED ORDER — DIPHENHYDRAMINE HCL 25 MG PO CAPS: 25.0000 mg | ORAL_CAPSULE | Freq: Once | ORAL | Status: DC

## 2024-04-28 MED ORDER — ACETAMINOPHEN 325 MG PO TABS: 650.0000 mg | ORAL_TABLET | Freq: Once | ORAL | Status: DC

## 2024-04-28 NOTE — Progress Notes (Signed)
 Pt not seen. Called at 12:09pm to reschedule.

## 2024-04-29 ENCOUNTER — Ambulatory Visit: Payer: Self-pay

## 2024-04-29 ENCOUNTER — Other Ambulatory Visit: Payer: MEDICAID

## 2024-05-01 ENCOUNTER — Ambulatory Visit (HOSPITAL_COMMUNITY): Payer: MEDICAID

## 2024-05-05 ENCOUNTER — Telehealth: Payer: Self-pay | Admitting: Pharmacy Technician

## 2024-05-05 ENCOUNTER — Ambulatory Visit: Payer: MEDICAID

## 2024-05-05 MED ORDER — DIPHENHYDRAMINE HCL 25 MG PO CAPS: 25.0000 mg | ORAL_CAPSULE | Freq: Once | ORAL | Status: DC

## 2024-05-05 MED ORDER — ACETAMINOPHEN 325 MG PO TABS: 650.0000 mg | ORAL_TABLET | Freq: Once | ORAL | Status: DC

## 2024-05-05 MED ORDER — IRON SUCROSE 300 MG IVPB - SIMPLE MED: 300.0000 mg | Freq: Once | Status: DC

## 2024-05-05 NOTE — Telephone Encounter (Signed)
 Auth Submission: NO AUTH NEEDED Site of care: Site of care: [facility] Payer: [employer] Medication & CPT/J Code(s) submitted: Venofer  (Iron  Sucrose) J1756 Diagnosis Code: D50.9 Route of submission (phone, fax, portal): n/a Phone # Fax # Auth type: Buy/Bill PB Units/visits requested: 3 Reference number: n/a Approval from: 05/04/2024 to 08/20/2024

## 2024-05-06 ENCOUNTER — Encounter: Payer: Self-pay | Admitting: Physician Assistant

## 2024-05-06 ENCOUNTER — Other Ambulatory Visit: Payer: Self-pay

## 2024-05-06 ENCOUNTER — Ambulatory Visit: Payer: MEDICAID | Admitting: *Deleted

## 2024-05-06 ENCOUNTER — Other Ambulatory Visit (HOSPITAL_COMMUNITY)
Admission: RE | Admit: 2024-05-06 | Discharge: 2024-05-06 | Disposition: A | Discharge status: Home / Self Care | Payer: MEDICAID | Source: Ambulatory Visit | Attending: Family Medicine | Admitting: Family Medicine

## 2024-05-06 VITALS — BP 141/95 | HR 84 | Ht 63.5 in | Wt 194.4 lb

## 2024-05-06 DIAGNOSIS — N3000 Acute cystitis without hematuria: Secondary | ICD-10-CM

## 2024-05-06 DIAGNOSIS — N898 Other specified noninflammatory disorders of vagina: Secondary | ICD-10-CM | POA: Insufficient documentation

## 2024-05-06 DIAGNOSIS — Z3200 Encounter for pregnancy test, result unknown: Secondary | ICD-10-CM

## 2024-05-06 DIAGNOSIS — R300 Dysuria: Secondary | ICD-10-CM | POA: Diagnosis not present

## 2024-05-06 LAB — POCT URINALYSIS DIP (DEVICE)
Bilirubin Urine: NEGATIVE
Glucose, UA: 250 mg/dL — AB
Hgb urine dipstick: NEGATIVE
Ketones, ur: NEGATIVE mg/dL
Nitrite: POSITIVE — AB
Protein, ur: NEGATIVE mg/dL
Specific Gravity, Urine: 1.025 (ref 1.005–1.030)
Urobilinogen, UA: 0.2 mg/dL (ref 0.0–1.0)
pH: 6 (ref 5.0–8.0)

## 2024-05-06 LAB — POCT PREGNANCY, URINE: Preg Test, Ur: NEGATIVE

## 2024-05-06 MED ORDER — PHENAZOPYRIDINE HCL 200 MG PO TABS
200.0000 mg | ORAL_TABLET | Freq: Three times a day (TID) | ORAL | 0 refills | Status: AC
  Filled 2024-05-06: qty 6, 2d supply, fill #0

## 2024-05-06 MED ORDER — NITROFURANTOIN MONOHYD MACRO 100 MG PO CAPS
100.0000 mg | ORAL_CAPSULE | Freq: Two times a day (BID) | ORAL | 0 refills | Status: AC
  Filled 2024-05-06: qty 14, 7d supply, fill #0

## 2024-05-06 NOTE — Progress Notes (Signed)
 Here for self swab. C/o burning pain with urination and noticing fishy odor. Denies any unusual vaginal discharge. Also reports about 2 weeks ago passed a sac with what she thought was a cord from her vagina when she felt pressure and pushed. States she was having pregnancy symptoms like irritable and states she has irregular bleeding before she passed the sac and then bled 7 more days. Will do self swab today , UA and UPT.  UA  positive for nitrites  and sent for culture. I offered treatment per protocol or wait until results. I explained if we treat now we may change treatment if urine culture shows better antibiotic. She request treatment today due to pain with urination. Per protocol Macrobid  and Pyridium  sent to pharmacy. UPT  negative. Self swab collected and sent for full wet prep. I explained if positive she will be contacted with result and treatment she voices understanding.  BP elevated x2 . Advised to follow up wth PCP. She voices understanding.  Rock Skip PEAK

## 2024-05-07 ENCOUNTER — Other Ambulatory Visit: Payer: Self-pay

## 2024-05-07 ENCOUNTER — Ambulatory Visit: Payer: Self-pay | Admitting: Obstetrics and Gynecology

## 2024-05-07 DIAGNOSIS — A5901 Trichomonal vulvovaginitis: Secondary | ICD-10-CM

## 2024-05-07 DIAGNOSIS — B9689 Other specified bacterial agents as the cause of diseases classified elsewhere: Secondary | ICD-10-CM

## 2024-05-07 LAB — CERVICOVAGINAL ANCILLARY ONLY
Bacterial Vaginitis (gardnerella): POSITIVE — AB
Candida Glabrata: NEGATIVE
Candida Vaginitis: NEGATIVE
Chlamydia: NEGATIVE
Comment: NEGATIVE
Comment: NEGATIVE
Comment: NEGATIVE
Comment: NEGATIVE
Comment: NEGATIVE
Comment: NORMAL
Neisseria Gonorrhea: NEGATIVE
Trichomonas: POSITIVE — AB

## 2024-05-07 MED ORDER — METRONIDAZOLE 500 MG PO TABS
500.0000 mg | ORAL_TABLET | Freq: Two times a day (BID) | ORAL | 0 refills | Status: AC
  Filled 2024-05-07: qty 14, 7d supply, fill #0

## 2024-05-08 ENCOUNTER — Other Ambulatory Visit: Payer: Self-pay

## 2024-05-08 MED ORDER — APIXABAN 5 MG PO TABS
5.0000 mg | ORAL_TABLET | Freq: Two times a day (BID) | ORAL | 1 refills | Status: AC
  Filled 2024-05-08: qty 60, 30d supply, fill #0

## 2024-05-09 LAB — URINE CULTURE

## 2024-05-11 NOTE — Telephone Encounter (Addendum)
 Called pt to discuss test results and prescription that was sent. Pt stated she just received the MyChart messages and will go to her pharmacy to pick up the medication. Pt denies any further questions or concerns at this time.   Cyndee, RN  ----- Message from Jerilynn Buddle, MD sent at 05/07/2024  3:43 PM EST ----- BV and trich noted on swab, will send rx for treatment, will need TOC in 6-8 weeks nursing to schedule

## 2024-05-12 ENCOUNTER — Ambulatory Visit: Payer: Self-pay | Admitting: Student

## 2024-05-12 ENCOUNTER — Ambulatory Visit: Payer: MEDICAID

## 2024-05-14 ENCOUNTER — Ambulatory Visit: Payer: Self-pay | Admitting: Student

## 2024-05-14 NOTE — Assessment & Plan Note (Signed)
" °   °  Component Value Date/Time   IRON  13 (L) 04/14/2024 1256   IRON  64 07/10/2023 1059   TIBC 476 (H) 04/14/2024 1256   TIBC 420 07/10/2023 1059   FERRITIN 7 (L) 04/14/2024 1258   FERRITIN 16 07/10/2023 1059   IRONPCTSAT 3 (L) 04/14/2024 1256   IRONPCTSAT 15 07/10/2023 1059  Followed by oncology, receiving iron  infusion therapy. "

## 2024-05-14 NOTE — Assessment & Plan Note (Signed)
 Last A1c 11.1, checked on it 11/13/2023, A1c today Home medication consist of metformin  1000 mg twice daily, Ozempic  0.25 mg weekly Lantus  to 30U, her novolog  to 12U BID with meals

## 2024-05-14 NOTE — Progress Notes (Unsigned)
" ° °  Established Patient Office Visit  Subjective   Patient ID: Dana Hughes, female    DOB: August 15, 1980  Age: 44 y.o. MRN: [id number]  No chief complaint on file.   [name] is a 44 y.o. female with past medical history of chronic PE on apixaban , HFrEF, hypertension, migraine without status migrainous, asthma, IBS, ovarian cyst, type 2 diabetes, bipolar 2, presents today for A1c check, retinal camera candidate, concerns for lupus, mammogram.  Review of Systems:  As per assessment and Plan   Objective:     There were no vitals filed for this visit. ***  Physical Exam General: Sitting in chair, no acute distress Cardiovascular: Regular rate Pulmonary: Breathing comfortably Abdomen: Soft, nontender, nondistended MSK: No lower extremity edema bilaterally  Last metabolic panel Lab Results  Component Value Date   GLUCOSE 190 (H) 04/14/2024   NA 138 04/14/2024   K 3.9 04/14/2024   CL 105 04/14/2024   CO2 24 04/14/2024   BUN <5 (L) 04/14/2024   CREATININE 0.78 04/14/2024   GFRNONAA >60 04/14/2024   CALCIUM  9.2 04/14/2024   PROT 7.7 04/14/2024   ALBUMIN 4.2 04/14/2024   LABGLOB 3.2 07/10/2023   BILITOT 0.2 04/14/2024   ALKPHOS 69 04/14/2024   AST 30 04/14/2024   ALT 18 04/14/2024   ANIONGAP 10 04/14/2024   Last lipids Lab Results  Component Value Date   CHOL 152 06/12/2022   HDL 40 06/12/2022   LDLCALC 76 06/12/2022   TRIG 216 (H) 06/12/2022   CHOLHDL 3.8 06/12/2022   Last hemoglobin A1c Lab Results  Component Value Date   HGBA1C 11.1 (A) 11/13/2023      The 10-year ASCVD risk score (Arnett DK, et al., 2019) is: 10.9%    Assessment & Plan:   Patient {GC/GE:[id number]::discussed with,seen with} Dr. {NAMES:[id number]::Chambliss,Chun,Hoffman,Lau,Machen,Narendra,Williams,Winfrey}  Assessment & Plan Hypertension, unspecified type BP Readings from Last 3 Encounters:  05/06/24 (!) 141/95  04/14/24 130/81  04/10/24 132/76  Home  medication consist of amlodipine  5 mg daily,  Type 2 diabetes mellitus with other specified complication, unspecified whether long term insulin  use (HCC) Last A1c 11.1, checked on it 11/13/2023, A1c today Home medication consist of metformin  1000 mg twice daily, Ozempic  0.25 mg weekly Lantus  to 30U, her novolog  to 12U BID with meals   Iron  deficiency anemia, unspecified iron  deficiency anemia type     Component Value Date/Time   IRON  13 (L) 04/14/2024 1256   IRON  64 07/10/2023 1059   TIBC 476 (H) 04/14/2024 1256   TIBC 420 07/10/2023 1059   FERRITIN 7 (L) 04/14/2024 1258   FERRITIN 16 07/10/2023 1059   IRONPCTSAT 3 (L) 04/14/2024 1256   IRONPCTSAT 15 07/10/2023 1059  Followed by oncology, receiving iron  infusion therapy.         Concern for Lupus  Fever, fatigue, weight loss Joint pain, morning stiffness duration, swelling Malar rash, photosensitivity, oral or nasal ulcers, hair loss Pleuritic chest pain, shortness of breath, pericardial pain Seizures, psychosis, cognitive impairment, peripheral neuropathy symptoms Foamy urine (proteinuria), hematuria, edema Easy bruising, bleeding  History of hypercoagulable state with recurrent PE and pregnancy loss, has had extensive hypercoagulable workup with Antithrombin III  WNL, protein C and S WNL, lupus anticoagulant not detected, beta-2  glycoprotein WNL, factor V Leyden WNL, prothrombin gene mutation not associated with risk for venous thromboembolism, cardiolipin antibodies WNL.     Problem List Items Addressed This Visit   None   No follow-ups on file.    Toma Edwards, DO "

## 2024-05-14 NOTE — Assessment & Plan Note (Signed)
 BP Readings from Last 3 Encounters:  05/06/24 (!) 141/95  04/14/24 130/81  04/10/24 132/76  Home medication consist of amlodipine  5 mg daily,

## 2024-05-19 ENCOUNTER — Ambulatory Visit: Payer: MEDICAID

## 2024-05-20 ENCOUNTER — Other Ambulatory Visit: Payer: Self-pay

## 2024-05-20 ENCOUNTER — Telehealth: Payer: Self-pay | Admitting: Dietician

## 2024-05-20 ENCOUNTER — Ambulatory Visit: Payer: Self-pay | Admitting: Dietician

## 2024-05-20 ENCOUNTER — Ambulatory Visit: Payer: Self-pay

## 2024-05-20 ENCOUNTER — Ambulatory Visit: Payer: MEDICAID | Admitting: Student

## 2024-05-20 DIAGNOSIS — E1169 Type 2 diabetes mellitus with other specified complication: Secondary | ICD-10-CM

## 2024-05-20 NOTE — Telephone Encounter (Signed)
 Talked to pt - reviewed her symptoms as noted in E2C2's note. Advised pt to go to the ED to be evaluated. Stated she will go.

## 2024-05-20 NOTE — Addendum Note (Signed)
 Addended by: NORRINE SHARPER on: 05/20/2024 04:11 PM   Modules accepted: Orders

## 2024-05-20 NOTE — Telephone Encounter (Addendum)
 Spoke with patient who states she needs to reschedule her appointment . She also told me she is getting insulin  pens from pharmacy and has CGM sensor, downloaded app and needs help learning how to use it. Call transferred to reschedule doctor appointment. Rec referral for diabetes self management education and support.

## 2024-05-20 NOTE — Telephone Encounter (Signed)
 FYI Only or Action Required?: FYI only for provider: ED advised.  Patient was last seen in primary care on 11/13/2023 by Nooruddin, Saad, MD.  Called Nurse Triage reporting Shortness of Breath and Palpitations.  Symptoms began several days ago.  Interventions attempted: Rest, hydration, or home remedies.  Symptoms are: gradually worsening.  Triage Disposition: Go to ED Now (Notify PCP)  Patient/caregiver understands and will follow disposition?: Yes  Message from [place] B sent at 05/20/2024  3:17 PM EST  Reason for Triage: tired, fatigue, burning eyes, hurts when she walks around. Fluttering heart when she lays on right side, but better when she lays on left side, SOB of breath when walking a short distance, thinks its due to the lupus. Was transferred over from Ms. Plyler to reschedule but pt mentioned symptoms. Pt is needing to reschedule appt but needed triaging before scheduling   Reason for Disposition  Extra heartbeats, irregular heart beating, or heart is beating very fast  (i.e., palpitations)  History of inherited increased risk of blood clots (e.g., Factor 5 Leiden, Anti-thrombin 3, Protein C or Protein S deficiency, Prothrombin mutation)  [1] MODERATE difficulty breathing (e.g., speaks in phrases, SOB even at rest, pulse 100-120) AND [2] NEW-onset or WORSE than normal  Answer Assessment - Initial Assessment Questions Patient with worsened SOB with short distances, fatigue, palpitations, and sharp pains to the right kidney area under her heart.   Palpitations worse when laying down on left side, eased up on right side but still present.   Recent history of blood clot to the lungs- as advised it was resolved and is taking her eliquis . CHF- with 6lb weight gain in the last 2 weeks. SOB with walking across the kitchen. Having to stop mid conversation to catch breath.   Advised ED for re-assessment for increased heart strain from PE, CHF exacerbation or new worsened PE. Patient  understands after education on patho of conditions    1. RESPIRATORY STATUS: Describe your breathing? (e.g., wheezing, shortness of breath, unable to speak, severe coughing)      Sob with short distances and after speaking a few sentances 2. ONSET: When did this breathing problem begin?      uta 3. PATTERN Does the difficult breathing come and go, or has it been constant since it started?      constant 4. SEVERITY: How bad is your breathing? (e.g., mild, moderate, severe)      Moderate to severe 5. RECURRENT SYMPTOM: Have you had difficulty breathing before? If Yes, ask: When was the last time? and What happened that time?      PE 6. CARDIAC HISTORY: Do you have any history of heart disease? (e.g., heart attack, angina, bypass surgery, angioplasty)      CHF 7. LUNG HISTORY: Do you have any history of lung disease?  (e.g., pulmonary embolus, asthma, emphysema)     PE 8. CAUSE: What do you think is causing the breathing problem?      unsure 9. OTHER SYMPTOMS: Do you have any other symptoms? (e.g., chest pain, cough, dizziness, fever, runny nose)     Palpitations, weight gain  Protocols used: Breathing Difficulty-A-AH

## 2024-05-20 NOTE — Addendum Note (Signed)
 Addended by: CAMIE ARLAND HERO on: 05/20/2024 04:01 PM   Modules accepted: Orders

## 2024-05-27 ENCOUNTER — Ambulatory Visit: Payer: MEDICAID

## 2024-05-27 ENCOUNTER — Other Ambulatory Visit: Payer: Self-pay

## 2024-06-01 ENCOUNTER — Ambulatory Visit: Payer: MEDICAID | Admitting: Obstetrics and Gynecology

## 2024-06-04 ENCOUNTER — Ambulatory Visit: Payer: MEDICAID

## 2024-06-15 ENCOUNTER — Inpatient Hospital Stay: Payer: MEDICAID

## 2024-06-15 ENCOUNTER — Inpatient Hospital Stay: Payer: MEDICAID | Admitting: Internal Medicine

## 2024-07-27 ENCOUNTER — Encounter: Payer: MEDICAID | Admitting: Pulmonary Disease
